# Patient Record
Sex: Female | Born: 1950
Health system: Southern US, Community
[De-identification: ages and names within clinical notes are randomized; demographics above are authoritative.]

## PROBLEM LIST (undated history)

## (undated) DIAGNOSIS — E785 Hyperlipidemia, unspecified: Secondary | ICD-10-CM

## (undated) DIAGNOSIS — I429 Cardiomyopathy, unspecified: Secondary | ICD-10-CM

## (undated) DIAGNOSIS — K589 Irritable bowel syndrome without diarrhea: Secondary | ICD-10-CM

## (undated) DIAGNOSIS — Z86010 Personal history of colonic polyps: Secondary | ICD-10-CM

## (undated) DIAGNOSIS — I219 Acute myocardial infarction, unspecified: Secondary | ICD-10-CM

## (undated) DIAGNOSIS — M1990 Unspecified osteoarthritis, unspecified site: Secondary | ICD-10-CM

## (undated) DIAGNOSIS — D126 Benign neoplasm of colon, unspecified: Secondary | ICD-10-CM

## (undated) DIAGNOSIS — D649 Anemia, unspecified: Secondary | ICD-10-CM

## (undated) DIAGNOSIS — K648 Other hemorrhoids: Secondary | ICD-10-CM

## (undated) DIAGNOSIS — M549 Dorsalgia, unspecified: Secondary | ICD-10-CM

## (undated) DIAGNOSIS — E039 Hypothyroidism, unspecified: Secondary | ICD-10-CM

## (undated) DIAGNOSIS — M797 Fibromyalgia: Secondary | ICD-10-CM

## (undated) DIAGNOSIS — K219 Gastro-esophageal reflux disease without esophagitis: Secondary | ICD-10-CM

## (undated) DIAGNOSIS — G8929 Other chronic pain: Secondary | ICD-10-CM

## (undated) DIAGNOSIS — J189 Pneumonia, unspecified organism: Secondary | ICD-10-CM

## (undated) DIAGNOSIS — I10 Essential (primary) hypertension: Secondary | ICD-10-CM

## (undated) DIAGNOSIS — K649 Unspecified hemorrhoids: Secondary | ICD-10-CM

## (undated) DIAGNOSIS — M79605 Pain in left leg: Secondary | ICD-10-CM

## (undated) HISTORY — PX: CERVICAL DISC SURGERY: SHX588

## (undated) HISTORY — DX: Benign neoplasm of colon, unspecified: D12.6

## (undated) HISTORY — DX: Irritable bowel syndrome, unspecified: K58.9

## (undated) HISTORY — DX: Dorsalgia, unspecified: M54.9

## (undated) HISTORY — DX: Anemia, unspecified: D64.9

## (undated) HISTORY — DX: Gastro-esophageal reflux disease without esophagitis: K21.9

## (undated) HISTORY — DX: Hypothyroidism, unspecified: E03.9

## (undated) HISTORY — DX: Unspecified osteoarthritis, unspecified site: M19.90

## (undated) HISTORY — DX: Other chronic pain: G89.29

## (undated) HISTORY — PX: TONSILLECTOMY: SUR1361

## (undated) HISTORY — DX: Fibromyalgia: M79.7

## (undated) HISTORY — PX: LUMBAR DISC SURGERY: SHX700

## (undated) HISTORY — DX: Other hemorrhoids: K64.8

## (undated) HISTORY — DX: Personal history of colonic polyps: Z86.010

## (undated) HISTORY — DX: Unspecified hemorrhoids: K64.9

## (undated) HISTORY — DX: Cardiomyopathy, unspecified: I42.9

## (undated) HISTORY — PX: ABDOMINAL HYSTERECTOMY: SUR658

## (undated) HISTORY — DX: Hyperlipidemia, unspecified: E78.5

## (undated) HISTORY — DX: Essential (primary) hypertension: I10

## (undated) HISTORY — PX: CHOLECYSTECTOMY: SHX55

## (undated) SURGERY — COLONOSCOPY
Anesthesia: Monitor Anesthesia Care

---

## 1998-04-07 ENCOUNTER — Ambulatory Visit (HOSPITAL_COMMUNITY): Admission: RE | Admit: 1998-04-07 | Discharge: 1998-04-07 | Payer: Self-pay | Admitting: Cardiovascular Disease

## 1998-06-05 ENCOUNTER — Ambulatory Visit (HOSPITAL_COMMUNITY): Admission: RE | Admit: 1998-06-05 | Discharge: 1998-06-05 | Payer: Self-pay | Admitting: Gastroenterology

## 1998-07-23 ENCOUNTER — Ambulatory Visit (HOSPITAL_COMMUNITY): Admission: RE | Admit: 1998-07-23 | Discharge: 1998-07-23 | Payer: Self-pay | Admitting: Obstetrics and Gynecology

## 1998-10-09 ENCOUNTER — Inpatient Hospital Stay (HOSPITAL_COMMUNITY): Admission: EM | Admit: 1998-10-09 | Discharge: 1998-10-11 | Payer: Self-pay | Admitting: Obstetrics and Gynecology

## 1998-10-16 ENCOUNTER — Inpatient Hospital Stay (HOSPITAL_COMMUNITY): Admission: RE | Admit: 1998-10-16 | Discharge: 1998-10-16 | Payer: Self-pay | Admitting: Obstetrics and Gynecology

## 1999-02-22 ENCOUNTER — Emergency Department (HOSPITAL_COMMUNITY): Admission: EM | Admit: 1999-02-22 | Discharge: 1999-02-22 | Payer: Self-pay | Admitting: Emergency Medicine

## 1999-07-07 ENCOUNTER — Ambulatory Visit (HOSPITAL_COMMUNITY): Admission: RE | Admit: 1999-07-07 | Discharge: 1999-07-07 | Payer: Self-pay | Admitting: Cardiovascular Disease

## 1999-07-07 ENCOUNTER — Encounter: Payer: Self-pay | Admitting: Cardiovascular Disease

## 1999-12-09 ENCOUNTER — Encounter: Payer: Self-pay | Admitting: Emergency Medicine

## 1999-12-09 ENCOUNTER — Emergency Department (HOSPITAL_COMMUNITY): Admission: EM | Admit: 1999-12-09 | Discharge: 1999-12-09 | Payer: Self-pay | Admitting: Emergency Medicine

## 1999-12-14 ENCOUNTER — Encounter: Admission: RE | Admit: 1999-12-14 | Discharge: 2000-03-13 | Payer: Self-pay | Admitting: Anesthesiology

## 2000-01-26 ENCOUNTER — Encounter: Admission: RE | Admit: 2000-01-26 | Discharge: 2000-03-31 | Payer: Self-pay | Admitting: *Deleted

## 2000-03-03 ENCOUNTER — Other Ambulatory Visit: Admission: RE | Admit: 2000-03-03 | Discharge: 2000-03-03 | Payer: Self-pay | Admitting: Obstetrics and Gynecology

## 2000-04-04 ENCOUNTER — Encounter: Admission: RE | Admit: 2000-04-04 | Discharge: 2000-07-03 | Payer: Self-pay | Admitting: Anesthesiology

## 2000-06-14 ENCOUNTER — Emergency Department (HOSPITAL_COMMUNITY): Admission: EM | Admit: 2000-06-14 | Discharge: 2000-06-14 | Payer: Self-pay | Admitting: Emergency Medicine

## 2000-07-11 ENCOUNTER — Ambulatory Visit (HOSPITAL_COMMUNITY): Admission: RE | Admit: 2000-07-11 | Discharge: 2000-07-11 | Payer: Self-pay | Admitting: Obstetrics and Gynecology

## 2000-07-11 ENCOUNTER — Encounter: Payer: Self-pay | Admitting: Obstetrics and Gynecology

## 2000-10-30 ENCOUNTER — Encounter: Payer: Self-pay | Admitting: Internal Medicine

## 2000-10-30 ENCOUNTER — Emergency Department (HOSPITAL_COMMUNITY): Admission: EM | Admit: 2000-10-30 | Discharge: 2000-10-30 | Payer: Self-pay | Admitting: Internal Medicine

## 2001-02-10 ENCOUNTER — Encounter: Admission: RE | Admit: 2001-02-10 | Discharge: 2001-02-10 | Payer: Self-pay | Admitting: *Deleted

## 2001-02-10 ENCOUNTER — Encounter: Payer: Self-pay | Admitting: *Deleted

## 2001-04-14 ENCOUNTER — Other Ambulatory Visit: Admission: RE | Admit: 2001-04-14 | Discharge: 2001-04-14 | Payer: Self-pay | Admitting: Obstetrics and Gynecology

## 2001-07-17 ENCOUNTER — Ambulatory Visit (HOSPITAL_COMMUNITY): Admission: RE | Admit: 2001-07-17 | Discharge: 2001-07-17 | Payer: Self-pay | Admitting: Obstetrics and Gynecology

## 2001-07-17 ENCOUNTER — Encounter: Payer: Self-pay | Admitting: Obstetrics and Gynecology

## 2001-08-10 ENCOUNTER — Emergency Department (HOSPITAL_COMMUNITY): Admission: EM | Admit: 2001-08-10 | Discharge: 2001-08-10 | Payer: Self-pay | Admitting: Emergency Medicine

## 2001-08-23 ENCOUNTER — Encounter: Payer: Self-pay | Admitting: Emergency Medicine

## 2001-08-23 ENCOUNTER — Emergency Department (HOSPITAL_COMMUNITY): Admission: EM | Admit: 2001-08-23 | Discharge: 2001-08-23 | Payer: Self-pay | Admitting: Emergency Medicine

## 2002-07-20 ENCOUNTER — Ambulatory Visit (HOSPITAL_COMMUNITY): Admission: RE | Admit: 2002-07-20 | Discharge: 2002-07-20 | Payer: Self-pay | Admitting: *Deleted

## 2002-07-20 ENCOUNTER — Encounter: Payer: Self-pay | Admitting: Obstetrics and Gynecology

## 2002-08-15 ENCOUNTER — Encounter: Payer: Self-pay | Admitting: Emergency Medicine

## 2002-08-15 ENCOUNTER — Emergency Department (HOSPITAL_COMMUNITY): Admission: EM | Admit: 2002-08-15 | Discharge: 2002-08-15 | Payer: Self-pay | Admitting: Emergency Medicine

## 2002-08-20 ENCOUNTER — Emergency Department (HOSPITAL_COMMUNITY): Admission: EM | Admit: 2002-08-20 | Discharge: 2002-08-20 | Payer: Self-pay | Admitting: Emergency Medicine

## 2002-08-27 ENCOUNTER — Other Ambulatory Visit: Admission: RE | Admit: 2002-08-27 | Discharge: 2002-08-27 | Payer: Self-pay | Admitting: Obstetrics and Gynecology

## 2002-09-25 ENCOUNTER — Emergency Department (HOSPITAL_COMMUNITY): Admission: EM | Admit: 2002-09-25 | Discharge: 2002-09-25 | Payer: Self-pay | Admitting: Emergency Medicine

## 2002-09-25 ENCOUNTER — Encounter: Payer: Self-pay | Admitting: Emergency Medicine

## 2002-10-01 ENCOUNTER — Encounter: Admission: RE | Admit: 2002-10-01 | Discharge: 2002-10-01 | Payer: Self-pay | Admitting: Family Medicine

## 2002-10-01 ENCOUNTER — Encounter: Payer: Self-pay | Admitting: Family Medicine

## 2002-10-02 ENCOUNTER — Encounter: Admission: RE | Admit: 2002-10-02 | Discharge: 2002-10-02 | Payer: Self-pay | Admitting: Family Medicine

## 2002-10-02 ENCOUNTER — Encounter: Payer: Self-pay | Admitting: Family Medicine

## 2003-04-02 ENCOUNTER — Other Ambulatory Visit: Admission: RE | Admit: 2003-04-02 | Discharge: 2003-04-02 | Payer: Self-pay

## 2003-04-04 ENCOUNTER — Encounter: Admission: RE | Admit: 2003-04-04 | Discharge: 2003-04-04 | Payer: Self-pay

## 2003-04-18 ENCOUNTER — Encounter: Payer: Self-pay | Admitting: Family Medicine

## 2003-04-18 ENCOUNTER — Encounter: Admission: RE | Admit: 2003-04-18 | Discharge: 2003-04-18 | Payer: Self-pay | Admitting: Family Medicine

## 2003-04-24 ENCOUNTER — Encounter: Admission: RE | Admit: 2003-04-24 | Discharge: 2003-04-24 | Payer: Self-pay | Admitting: Family Medicine

## 2003-04-24 ENCOUNTER — Encounter: Payer: Self-pay | Admitting: Family Medicine

## 2003-07-22 ENCOUNTER — Ambulatory Visit (HOSPITAL_COMMUNITY): Admission: RE | Admit: 2003-07-22 | Discharge: 2003-07-22 | Payer: Self-pay | Admitting: Family Medicine

## 2003-07-22 ENCOUNTER — Encounter: Payer: Self-pay | Admitting: Family Medicine

## 2003-09-04 ENCOUNTER — Encounter: Payer: Self-pay | Admitting: Physician Assistant

## 2003-09-04 ENCOUNTER — Ambulatory Visit (HOSPITAL_COMMUNITY): Admission: RE | Admit: 2003-09-04 | Discharge: 2003-09-04 | Payer: Self-pay | Admitting: Gastroenterology

## 2004-07-22 ENCOUNTER — Ambulatory Visit (HOSPITAL_COMMUNITY): Admission: RE | Admit: 2004-07-22 | Discharge: 2004-07-22 | Payer: Self-pay | Admitting: Family Medicine

## 2004-08-30 ENCOUNTER — Emergency Department (HOSPITAL_COMMUNITY): Admission: EM | Admit: 2004-08-30 | Discharge: 2004-08-30 | Payer: Self-pay | Admitting: Emergency Medicine

## 2005-07-23 ENCOUNTER — Ambulatory Visit (HOSPITAL_COMMUNITY): Admission: RE | Admit: 2005-07-23 | Discharge: 2005-07-23 | Payer: Self-pay | Admitting: Family Medicine

## 2006-02-26 ENCOUNTER — Encounter: Admission: RE | Admit: 2006-02-26 | Discharge: 2006-02-26 | Payer: Self-pay | Admitting: Neurosurgery

## 2006-03-21 ENCOUNTER — Ambulatory Visit (HOSPITAL_COMMUNITY): Admission: RE | Admit: 2006-03-21 | Discharge: 2006-03-22 | Payer: Self-pay | Admitting: Neurosurgery

## 2006-07-25 ENCOUNTER — Ambulatory Visit (HOSPITAL_COMMUNITY): Admission: RE | Admit: 2006-07-25 | Discharge: 2006-07-25 | Payer: Self-pay | Admitting: Family Medicine

## 2006-09-13 ENCOUNTER — Encounter: Admission: RE | Admit: 2006-09-13 | Discharge: 2006-09-13 | Payer: Self-pay | Admitting: Neurosurgery

## 2006-10-03 ENCOUNTER — Inpatient Hospital Stay (HOSPITAL_COMMUNITY): Admission: RE | Admit: 2006-10-03 | Discharge: 2006-10-06 | Payer: Self-pay | Admitting: Neurosurgery

## 2006-11-10 ENCOUNTER — Encounter: Admission: RE | Admit: 2006-11-10 | Discharge: 2006-11-10 | Payer: Self-pay | Admitting: Neurosurgery

## 2007-03-23 ENCOUNTER — Emergency Department (HOSPITAL_COMMUNITY): Admission: EM | Admit: 2007-03-23 | Discharge: 2007-03-23 | Payer: Self-pay | Admitting: Emergency Medicine

## 2007-03-26 ENCOUNTER — Encounter: Admission: RE | Admit: 2007-03-26 | Discharge: 2007-03-26 | Payer: Self-pay | Admitting: Neurosurgery

## 2007-03-30 ENCOUNTER — Other Ambulatory Visit: Admission: RE | Admit: 2007-03-30 | Discharge: 2007-03-30 | Payer: Self-pay | Admitting: Unknown Physician Specialty

## 2007-07-27 ENCOUNTER — Ambulatory Visit (HOSPITAL_COMMUNITY): Admission: RE | Admit: 2007-07-27 | Discharge: 2007-07-27 | Payer: Self-pay | Admitting: Family Medicine

## 2007-10-18 ENCOUNTER — Encounter: Admission: RE | Admit: 2007-10-18 | Discharge: 2007-10-18 | Payer: Self-pay | Admitting: Neurosurgery

## 2008-01-25 ENCOUNTER — Encounter: Admission: RE | Admit: 2008-01-25 | Discharge: 2008-01-25 | Payer: Self-pay | Admitting: Otolaryngology

## 2008-04-05 ENCOUNTER — Encounter: Admission: RE | Admit: 2008-04-05 | Discharge: 2008-04-05 | Payer: Self-pay | Admitting: Neurosurgery

## 2008-04-11 ENCOUNTER — Encounter: Admission: RE | Admit: 2008-04-11 | Discharge: 2008-04-11 | Payer: Self-pay | Admitting: Neurosurgery

## 2008-06-04 ENCOUNTER — Encounter: Admission: RE | Admit: 2008-06-04 | Discharge: 2008-06-04 | Payer: Self-pay | Admitting: Neurosurgery

## 2008-07-02 LAB — HM PAP SMEAR: HM Pap smear: NORMAL

## 2008-07-30 ENCOUNTER — Ambulatory Visit (HOSPITAL_COMMUNITY): Admission: RE | Admit: 2008-07-30 | Discharge: 2008-07-30 | Payer: Self-pay | Admitting: Family Medicine

## 2008-07-31 ENCOUNTER — Encounter: Payer: Self-pay | Admitting: Cardiovascular Disease

## 2008-08-02 ENCOUNTER — Encounter: Payer: Self-pay | Admitting: Cardiovascular Disease

## 2008-08-15 ENCOUNTER — Encounter: Admission: RE | Admit: 2008-08-15 | Discharge: 2008-08-15 | Payer: Self-pay | Admitting: Neurosurgery

## 2008-09-19 ENCOUNTER — Encounter: Admission: RE | Admit: 2008-09-19 | Discharge: 2008-09-19 | Payer: Self-pay | Admitting: Neurosurgery

## 2009-04-18 ENCOUNTER — Encounter: Admission: RE | Admit: 2009-04-18 | Discharge: 2009-04-18 | Payer: Self-pay | Admitting: Neurosurgery

## 2009-04-19 ENCOUNTER — Emergency Department (HOSPITAL_COMMUNITY): Admission: EM | Admit: 2009-04-19 | Discharge: 2009-04-19 | Payer: Self-pay | Admitting: Emergency Medicine

## 2009-04-22 ENCOUNTER — Encounter: Payer: Self-pay | Admitting: Physician Assistant

## 2009-04-22 ENCOUNTER — Encounter: Admission: RE | Admit: 2009-04-22 | Discharge: 2009-04-22 | Payer: Self-pay | Admitting: Neurosurgery

## 2009-04-25 ENCOUNTER — Ambulatory Visit: Payer: Self-pay | Admitting: Gastroenterology

## 2009-04-25 DIAGNOSIS — E039 Hypothyroidism, unspecified: Secondary | ICD-10-CM | POA: Insufficient documentation

## 2009-04-25 DIAGNOSIS — I10 Essential (primary) hypertension: Secondary | ICD-10-CM | POA: Insufficient documentation

## 2009-04-25 DIAGNOSIS — M549 Dorsalgia, unspecified: Secondary | ICD-10-CM | POA: Insufficient documentation

## 2009-04-25 DIAGNOSIS — IMO0001 Reserved for inherently not codable concepts without codable children: Secondary | ICD-10-CM | POA: Insufficient documentation

## 2009-04-25 LAB — CONVERTED CEMR LAB
Basophils Absolute: 0 10*3/uL (ref 0.0–0.1)
Bilirubin, Direct: 0.1 mg/dL (ref 0.0–0.3)
CO2: 31 meq/L (ref 19–32)
Calcium: 9.5 mg/dL (ref 8.4–10.5)
Creatinine, Ser: 0.7 mg/dL (ref 0.4–1.2)
Eosinophils Absolute: 0.2 10*3/uL (ref 0.0–0.7)
Ferritin: 49.8 ng/mL (ref 10.0–291.0)
Folate: 8.8 ng/mL
GFR calc non Af Amer: 110.56 mL/min (ref >60)
HCT: 38.8 % (ref 36.0–46.0)
Hemoglobin: 13.1 g/dL (ref 12.0–15.0)
Lymphs Abs: 2.5 10*3/uL (ref 0.7–4.0)
MCHC: 33.8 g/dL (ref 30.0–36.0)
MCV: 80 fL (ref 78.0–100.0)
Monocytes Absolute: 0.3 10*3/uL (ref 0.1–1.0)
Neutro Abs: 4 10*3/uL (ref 1.4–7.7)
Platelets: 309 10*3/uL (ref 150.0–400.0)
RDW: 13.8 % (ref 11.5–14.6)
Total Protein: 8.1 g/dL (ref 6.0–8.3)
Transferrin: 260.4 mg/dL (ref 212.0–360.0)
Vitamin B-12: 502 pg/mL (ref 211–911)

## 2009-05-02 ENCOUNTER — Telehealth: Payer: Self-pay | Admitting: Gastroenterology

## 2009-07-22 ENCOUNTER — Ambulatory Visit: Payer: Self-pay | Admitting: *Deleted

## 2009-07-23 ENCOUNTER — Observation Stay (HOSPITAL_COMMUNITY): Admission: EM | Admit: 2009-07-23 | Discharge: 2009-07-24 | Payer: Self-pay | Admitting: Emergency Medicine

## 2009-07-23 ENCOUNTER — Encounter: Payer: Self-pay | Admitting: Physician Assistant

## 2009-07-24 ENCOUNTER — Encounter: Payer: Self-pay | Admitting: Physician Assistant

## 2009-07-29 ENCOUNTER — Telehealth: Payer: Self-pay | Admitting: Gastroenterology

## 2009-07-30 ENCOUNTER — Ambulatory Visit (HOSPITAL_COMMUNITY): Admission: RE | Admit: 2009-07-30 | Discharge: 2009-07-30 | Payer: Self-pay | Admitting: Gastroenterology

## 2009-07-30 ENCOUNTER — Ambulatory Visit: Payer: Self-pay | Admitting: Gastroenterology

## 2009-07-30 DIAGNOSIS — E785 Hyperlipidemia, unspecified: Secondary | ICD-10-CM | POA: Insufficient documentation

## 2009-07-31 ENCOUNTER — Encounter: Payer: Self-pay | Admitting: Physician Assistant

## 2009-07-31 ENCOUNTER — Ambulatory Visit (HOSPITAL_COMMUNITY): Admission: RE | Admit: 2009-07-31 | Discharge: 2009-07-31 | Payer: Self-pay | Admitting: Family Medicine

## 2009-08-01 LAB — CONVERTED CEMR LAB
ALT: 17 units/L (ref 0–35)
Albumin: 3.8 g/dL (ref 3.5–5.2)
Alkaline Phosphatase: 109 units/L (ref 39–117)
Basophils Absolute: 0 10*3/uL (ref 0.0–0.1)
Bilirubin, Direct: 0.1 mg/dL (ref 0.0–0.3)
Eosinophils Absolute: 0.3 10*3/uL (ref 0.0–0.7)
Lymphocytes Relative: 31.4 % (ref 12.0–46.0)
MCHC: 34.1 g/dL (ref 30.0–36.0)
Neutrophils Relative %: 59.7 % (ref 43.0–77.0)
RBC: 4.94 M/uL (ref 3.87–5.11)
RDW: 13.5 % (ref 11.5–14.6)
Total Protein: 8 g/dL (ref 6.0–8.3)

## 2009-08-12 ENCOUNTER — Encounter: Payer: Self-pay | Admitting: Gastroenterology

## 2009-08-22 ENCOUNTER — Ambulatory Visit (HOSPITAL_COMMUNITY): Admission: RE | Admit: 2009-08-22 | Discharge: 2009-08-22 | Payer: Self-pay | Admitting: General Surgery

## 2009-08-22 ENCOUNTER — Encounter (INDEPENDENT_AMBULATORY_CARE_PROVIDER_SITE_OTHER): Payer: Self-pay | Admitting: General Surgery

## 2009-09-18 ENCOUNTER — Encounter: Payer: Self-pay | Admitting: Gastroenterology

## 2009-09-22 ENCOUNTER — Telehealth: Payer: Self-pay | Admitting: Physician Assistant

## 2009-10-20 ENCOUNTER — Ambulatory Visit: Payer: Self-pay | Admitting: Internal Medicine

## 2009-10-20 DIAGNOSIS — K219 Gastro-esophageal reflux disease without esophagitis: Secondary | ICD-10-CM | POA: Insufficient documentation

## 2009-10-20 LAB — CONVERTED CEMR LAB
ALT: 12 units/L (ref 0–35)
Basophils Relative: 0.1 % (ref 0.0–3.0)
Bilirubin, Direct: 0.1 mg/dL (ref 0.0–0.3)
Chloride: 99 meq/L (ref 96–112)
Cholesterol, target level: 200 mg/dL
Eosinophils Relative: 2.7 % (ref 0.0–5.0)
HCT: 37.5 % (ref 36.0–46.0)
LDL Cholesterol: 64 mg/dL (ref 0–99)
LDL Goal: 130 mg/dL
Lymphs Abs: 2.8 10*3/uL (ref 0.7–4.0)
MCV: 82.3 fL (ref 78.0–100.0)
Monocytes Absolute: 0.4 10*3/uL (ref 0.1–1.0)
Neutrophils Relative %: 56.2 % (ref 43.0–77.0)
Potassium: 3.9 meq/L (ref 3.5–5.1)
RBC: 4.56 M/uL (ref 3.87–5.11)
Sodium: 139 meq/L (ref 135–145)
Total CHOL/HDL Ratio: 4
Total Protein: 7.8 g/dL (ref 6.0–8.3)
WBC: 7.6 10*3/uL (ref 4.5–10.5)

## 2009-12-11 ENCOUNTER — Ambulatory Visit: Payer: Self-pay | Admitting: Internal Medicine

## 2009-12-11 LAB — CONVERTED CEMR LAB
Albumin: 3.8 g/dL (ref 3.5–5.2)
Alkaline Phosphatase: 116 units/L (ref 39–117)
BUN: 8 mg/dL (ref 6–23)
Calcium: 9 mg/dL (ref 8.4–10.5)
Creatinine, Ser: 0.7 mg/dL (ref 0.4–1.2)
GFR calc non Af Amer: 110.32 mL/min (ref >60)
Glucose, Bld: 69 mg/dL — ABNORMAL LOW (ref 70–99)
Sodium: 137 meq/L (ref 135–145)
TSH: 8.29 microintl units/mL — ABNORMAL HIGH (ref 0.35–5.50)
Total CK: 92 units/L (ref 7–177)

## 2009-12-12 ENCOUNTER — Encounter: Payer: Self-pay | Admitting: Internal Medicine

## 2009-12-25 ENCOUNTER — Ambulatory Visit: Payer: Self-pay | Admitting: Internal Medicine

## 2009-12-25 DIAGNOSIS — E876 Hypokalemia: Secondary | ICD-10-CM | POA: Insufficient documentation

## 2010-01-09 ENCOUNTER — Telehealth: Payer: Self-pay | Admitting: Internal Medicine

## 2010-02-17 ENCOUNTER — Ambulatory Visit: Payer: Self-pay | Admitting: Internal Medicine

## 2010-02-17 LAB — CONVERTED CEMR LAB
Eosinophils Relative: 2.1 % (ref 0.0–5.0)
GFR calc non Af Amer: 82.49 mL/min (ref >60)
HCT: 35.9 % — ABNORMAL LOW (ref 36.0–46.0)
Hemoglobin: 11.7 g/dL — ABNORMAL LOW (ref 12.0–15.0)
Lymphocytes Relative: 34.1 % (ref 12.0–46.0)
Lymphs Abs: 2.4 10*3/uL (ref 0.7–4.0)
Monocytes Relative: 5.2 % (ref 3.0–12.0)
Neutro Abs: 4 10*3/uL (ref 1.4–7.7)
Potassium: 3.6 meq/L (ref 3.5–5.1)
RBC: 4.4 M/uL (ref 3.87–5.11)
Sodium: 143 meq/L (ref 135–145)
WBC: 7 10*3/uL (ref 4.5–10.5)

## 2010-02-18 ENCOUNTER — Encounter: Payer: Self-pay | Admitting: Internal Medicine

## 2010-02-20 ENCOUNTER — Encounter: Payer: Self-pay | Admitting: Internal Medicine

## 2010-04-08 ENCOUNTER — Ambulatory Visit: Payer: Self-pay | Admitting: Internal Medicine

## 2010-04-14 ENCOUNTER — Encounter: Admission: RE | Admit: 2010-04-14 | Discharge: 2010-04-14 | Payer: Self-pay | Admitting: Neurosurgery

## 2010-04-21 ENCOUNTER — Ambulatory Visit: Payer: Self-pay | Admitting: Internal Medicine

## 2010-04-21 DIAGNOSIS — E1149 Type 2 diabetes mellitus with other diabetic neurological complication: Secondary | ICD-10-CM | POA: Insufficient documentation

## 2010-04-21 DIAGNOSIS — D649 Anemia, unspecified: Secondary | ICD-10-CM | POA: Insufficient documentation

## 2010-04-21 LAB — CONVERTED CEMR LAB
Basophils Absolute: 0.1 10*3/uL (ref 0.0–0.1)
CO2: 32 meq/L (ref 19–32)
Calcium: 9.3 mg/dL (ref 8.4–10.5)
Chloride: 104 meq/L (ref 96–112)
Folate: 6.8 ng/mL
GFR calc non Af Amer: 95.83 mL/min (ref >60)
Glucose, Bld: 105 mg/dL — ABNORMAL HIGH (ref 70–99)
Hemoglobin: 12.6 g/dL (ref 12.0–15.0)
Iron: 50 ug/dL (ref 42–145)
Lymphocytes Relative: 32.2 % (ref 12.0–46.0)
Monocytes Relative: 5.8 % (ref 3.0–12.0)
Neutro Abs: 4.8 10*3/uL (ref 1.4–7.7)
Potassium: 3.6 meq/L (ref 3.5–5.1)
RDW: 14.4 % (ref 11.5–14.6)
TSH: 1.42 microintl units/mL (ref 0.35–5.50)
WBC: 8 10*3/uL (ref 4.5–10.5)

## 2010-04-24 ENCOUNTER — Encounter (INDEPENDENT_AMBULATORY_CARE_PROVIDER_SITE_OTHER): Payer: Self-pay | Admitting: *Deleted

## 2010-04-24 ENCOUNTER — Ambulatory Visit: Payer: Self-pay | Admitting: Gastroenterology

## 2010-05-05 ENCOUNTER — Telehealth: Payer: Self-pay | Admitting: Gastroenterology

## 2010-05-06 ENCOUNTER — Ambulatory Visit: Payer: Self-pay | Admitting: Gastroenterology

## 2010-05-06 DIAGNOSIS — Z860100 Personal history of colon polyps, unspecified: Secondary | ICD-10-CM

## 2010-05-06 HISTORY — DX: Personal history of colonic polyps: Z86.010

## 2010-05-06 HISTORY — DX: Personal history of colon polyps, unspecified: Z86.0100

## 2010-05-06 LAB — HM COLONOSCOPY

## 2010-05-08 ENCOUNTER — Encounter: Payer: Self-pay | Admitting: Gastroenterology

## 2010-06-23 ENCOUNTER — Ambulatory Visit: Payer: Self-pay | Admitting: Internal Medicine

## 2010-06-23 DIAGNOSIS — T6391XA Toxic effect of contact with unspecified venomous animal, accidental (unintentional), initial encounter: Secondary | ICD-10-CM | POA: Insufficient documentation

## 2010-07-29 ENCOUNTER — Telehealth: Payer: Self-pay | Admitting: Internal Medicine

## 2010-08-04 ENCOUNTER — Ambulatory Visit (HOSPITAL_COMMUNITY): Admission: RE | Admit: 2010-08-04 | Discharge: 2010-08-04 | Payer: Self-pay | Admitting: Internal Medicine

## 2010-08-04 LAB — HM MAMMOGRAPHY: HM Mammogram: NEGATIVE

## 2010-08-07 ENCOUNTER — Encounter: Admission: RE | Admit: 2010-08-07 | Discharge: 2010-08-07 | Payer: Self-pay | Admitting: Neurosurgery

## 2010-10-02 ENCOUNTER — Telehealth: Payer: Self-pay | Admitting: Internal Medicine

## 2010-10-16 ENCOUNTER — Encounter: Admission: RE | Admit: 2010-10-16 | Discharge: 2010-10-16 | Payer: Self-pay | Admitting: Neurosurgery

## 2010-10-21 ENCOUNTER — Ambulatory Visit: Payer: Self-pay | Admitting: Internal Medicine

## 2010-10-21 LAB — CONVERTED CEMR LAB
ALT: 14 units/L (ref 0–35)
Basophils Relative: 0.6 % (ref 0.0–3.0)
Bilirubin, Direct: 0.1 mg/dL (ref 0.0–0.3)
Chloride: 98 meq/L (ref 96–112)
Eosinophils Relative: 1.6 % (ref 0.0–5.0)
HCT: 36.5 % (ref 36.0–46.0)
Hgb A1c MFr Bld: 6.1 % (ref 4.6–6.5)
Lymphs Abs: 2.7 10*3/uL (ref 0.7–4.0)
MCV: 79.8 fL (ref 78.0–100.0)
Monocytes Absolute: 0.5 10*3/uL (ref 0.1–1.0)
Potassium: 3.5 meq/L (ref 3.5–5.1)
RBC: 4.57 M/uL (ref 3.87–5.11)
TSH: 3.17 microintl units/mL (ref 0.35–5.50)
Total CHOL/HDL Ratio: 6
Total Protein: 7.1 g/dL (ref 6.0–8.3)
VLDL: 42 mg/dL — ABNORMAL HIGH (ref 0.0–40.0)
WBC: 7.5 10*3/uL (ref 4.5–10.5)

## 2010-12-29 NOTE — Progress Notes (Signed)
----   Converted from flag ---- ---- 07/29/2010 3:51 PM, Denice Paradise wrote: [name];  Pt [name])requesting samples of aciphex & blood pressure medicine. [phone], pls let her know.  [name] ------------------------------  Phone Note Outgoing Call   Summary of Call: Patient notified.Marland KitchenMarland KitchenEllison [name] Archie CMA  July 30, 2010 2:37 PM

## 2010-12-29 NOTE — Progress Notes (Signed)
Summary: Samples  Phone Note Call from Patient Call back at [facility] Phone [phone]   Summary of Call: Patient is requesting samples of aciphex & BP med Initial call taken by: Charlsie Quest, CMA,  October 02, 2010 12:29 PM  Follow-up for Phone Call        Samples of Diovan ready, Aciphex script sent to pharmacy.Ellison Hughs Archie CMA  October 02, 2010 1:51 PM     Prescriptions: ACIPHEX 20 MG TBEC (RABEPRAZOLE SODIUM) once daily for heartburn  #30 x 11   Entered by:   Estell Harpin CMA   Authorized by:   [name] MD   Signed by:   Estell Harpin CMA on 10/02/2010   Method used:   [facility]        [facility] [address] [phone]* (retail)       [address]       [place], [place]  [address]       Ph: [phone] or [phone]       Fax: [phone]   RxID:   [id number]

## 2010-12-29 NOTE — Assessment & Plan Note (Signed)
Summary: anemic--ch.   History of Present Illness Visit Type: consult  Primary GI MD: Verl Blalock MD [place] Primary Provider: [name], MD  Requesting Provider: [name] MD Chief Complaint: anemia  History of Present Illness:   Mild iron deficiency with a saturation of 15% and normal hemoglobin and a 60 year old [place] American female with no other GI symptoms. Her last colonoscopy was 8-10 years ago. She is on daily AcipHex for GERD and denies any GI symptomatology. She is status post cholecystectomy within the last few years. Her appetite is good her weight is stable. She denies melena, hematochezia, or any systemic complaints. Her appetite is good her weight is stable. She denies abuse of alcohol, cigarettes, or NSAIDs.   GI Review of Systems      Denies abdominal pain, acid reflux, belching, bloating, chest pain, dysphagia with liquids, dysphagia with solids, heartburn, loss of appetite, nausea, vomiting, vomiting blood, weight loss, and  weight gain.        Denies anal fissure, black tarry stools, change in bowel habit, constipation, diarrhea, diverticulosis, fecal incontinence, heme positive stool, hemorrhoids, irritable bowel syndrome, jaundice, light color stool, liver problems, rectal bleeding, and  rectal pain.    Current Medications (verified): 1)  Metoprolol Succinate 25 Mg Xr24h-Tab (Metoprolol Succinate) .... Take 1 Tab Daily 2)  Aciphex 20 Mg Tbec (Rabeprazole Sodium) .... Once Daily For Heartburn 3)  Levoxyl 100 Mcg Tabs (Levothyroxine Sodium) .... One By Mouth Once Daily 4)  Diovan Hct 160-12.5 Mg Tabs (Valsartan-Hydrochlorothiazide) .... One By Mouth Once Daily For High Blood Pressure  Allergies (verified): 1)  ! Vicodin 2)  ! Asa 3)  ! * Contrast Dye 4)  ! Lipitor  Past History:  Past medical, surgical, family and social histories (including risk factors) reviewed for relevance to current acute and chronic problems.  Past Medical  History: Reviewed history from 04/21/2010 and no changes required. HYPOTHYROIDISM (ICD-244.9) HYPERTENSION (ICD-401.9) CHOLELITHIASIS (ICD-574.20) FIBROMYALGIA (ICD-729.1) CHRONIC BACK PAIN HYPERLIPIDEMIA CONSTIPATION   Anemia-NOS  Past Surgical History: Reviewed history from 10/20/2009 and no changes required. Hysterectomy Herniated disk surgery Lumbar surgery Tonsillectomy  Family History: Reviewed history from 04/25/2009 and no changes required. Family History of Diabetes: Aunt, Uncles Family History of Breast Cancer:Aunt Family History of Colon Cancer:Maternal Uncle   Social History: Reviewed history from 04/25/2009 and no changes required. 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  Alcohol Use - no Daily Caffeine Use Illicit Drug Use - no  Review of Systems       The patient complains of anemia, fatigue, and muscle pains/cramps.  The patient denies allergy/sinus, anxiety-new, arthritis/joint pain, back pain, blood in urine, breast changes/lumps, change in vision, confusion, cough, coughing up blood, depression-new, fainting, fever, headaches-new, hearing problems, heart murmur, heart rhythm changes, itching, menstrual pain, night sweats, nosebleeds, pregnancy symptoms, shortness of breath, skin rash, sleeping problems, sore throat, swelling of feet/legs, swollen lymph glands, thirst - excessive, urination - excessive, urination changes/pain, urine leakage, vision changes, and voice change.    Vital Signs:  Patient profile:   60 year old female Height:      67 inches Weight:      204 pounds BMI:     32.07 BSA:     2.04 Pulse rate:   74 / minute Pulse rhythm:   regular BP sitting:   132 / 84  (left arm) Cuff size:   large  Vitals Entered By: Hope Pigeon CMA (Apr 24, 2010 10:35 AM)  Physical Exam  General:  Well developed, well nourished, no acute distress.healthy appearing.   Head:  Normocephalic and atraumatic. Eyes:  PERRLA, no icterus.exam deferred to  patient's ophthalmologist.   Neck:  Supple; no masses or thyromegaly. Lungs:  Clear throughout to auscultation. Heart:  Regular rate and rhythm; no murmurs, rubs,  or bruits. Abdomen:  Soft, nontender and nondistended. No masses, hepatosplenomegaly or hernias noted. Normal bowel sounds. Extremities:  No clubbing, cyanosis, edema or deformities noted. Neurologic:  Alert and  oriented x4;  grossly normal neurologically. Cervical Nodes:  No significant cervical adenopathy. Psych:  Alert and cooperative. Normal mood and affect.   Impression & Recommendations:  Problem # 1:  ANEMIA-NOS (JAS-505.9) Assessment Improved Outpatient colonoscopy scheduled her convenience. Orders: Colonoscopy (Colon)  Problem # 2:  GERD (ICD-530.81) Assessment: Improved Continue reflex regime and daily PPI therapy. I do not think she needs repeat endoscopy at this time  Problem # 3:  HYPERTENSION (ICD-401.9) Assessment: Improved blood pressure today 132/84 and she is to continue her antihypertensives as per primary care.  Patient Instructions: 1)  Please pick up your prescription at your pharmacy.  MOVIPREP 2)  We will see you at your procedure on 05/06/10. 3)  [place] [facility] Patient Information Guide given to patient.  4)  Colonoscopy and Flexible Sigmoidoscopy brochure given.  5)  Copy sent to : [name], MD 6)  The medication list was reviewed and reconciled.  All changed / newly prescribed medications were explained.  A complete medication list was provided to the patient / caregiver. 7)  Copy sent to :  8)  Colonoscopy and Flexible Sigmoidoscopy brochure given.  9)  Conscious Sedation brochure given.  Prescriptions: MOVIPREP 100 GM  SOLR (PEG-KCL-NACL-NASULF-NA ASC-C) As per prep instructions.  #1 x 0   Entered by:   Alberteen Spindle RN   Authorized by:   Sable Feil MD [facility]   Signed by:   Alberteen Spindle RN on 04/24/2010   Method used:   [facility]        [facility]  [address] [phone]* (retail)       [address]       [place], [place]  [address]       Ph: [phone] or [phone]       Fax: [phone]   RxID:   [id number]

## 2010-12-29 NOTE — Progress Notes (Signed)
Summary: Samples  Phone Note Call from Patient Call back at [facility] Phone [phone]   Caller: Patient Call For: Dr [name] Summary of Call: Pt requesting refill of acid refllex and  a phone call. Initial call taken by: Denice Paradise,  January 09, 2010 11:37 AM  Follow-up for Phone Call        Pt came in office, she wanted samples, gave pt 6 boxes  Follow-up by: Charlsie Quest, [place],  January 09, 2010 2:35 PM

## 2010-12-29 NOTE — Miscellaneous (Signed)
Summary: [facility]   Imported By: Bubba Hales 02/27/2010 08:41:05  _____________________________________________________________________  External Attachment:    Type:   Image     Comment:   External Document

## 2010-12-29 NOTE — Assessment & Plan Note (Signed)
Summary: 2 MO ROV /NWS   Vital Signs:  Patient profile:   60 year old female Height:      67 inches Weight:      202 pounds BMI:     31.75 O2 Sat:      94 % on Room air Temp:     98.4 degrees F oral Pulse rate:   72 / minute Pulse rhythm:   regular Resp:     16 per minute BP sitting:   112 / 68  (left arm) Cuff size:   large  Vitals Entered By: Estell Harpin CMA (June 23, 2010 8:10 AM)  Nutrition Counseling: Patient's BMI is greater than 25 and therefore counseled on weight management options.  O2 Flow:  Room air CC: pt c/o Left shoulder pain since bee sting Is Patient Diabetic? No Pain Assessment Patient in pain? yes     Location: L shoulder   Primary Care Provider:  [name], MD   CC:  pt c/o Left shoulder pain since bee sting.  History of Present Illness:  Hypertension Follow-Up      This is a 60 year old woman who presents for Hypertension follow-up.  The patient reports fatigue, but denies lightheadedness, urinary frequency, headaches, edema, and rash.  The patient denies the following associated symptoms: chest pain, chest pressure, exercise intolerance, dyspnea, palpitations, syncope, leg edema, and pedal edema.  Compliance with medications (by patient report) has been near 100%.  The patient reports that dietary compliance has been good.  The patient reports exercising occasionally.  Adjunctive measures currently used by the patient include salt restriction and relaxation.    She was stung by a yellow jacket on the top of her left shoulder 3 days ago=she had pain and itching for one day but that has resolved and she feels like the area has healed.  Preventive Screening-Counseling & Management  Alcohol-Tobacco     Alcohol drinks/day: 0     Smoking Status: never  Hep-HIV-STD-Contraception     Hepatitis Risk: no risk noted     HIV Risk: no risk noted     STD Risk: no risk noted     SBE monthly: yes     SBE Education/Counseling: to perform regular  SBE      Sexual History:  currently monogamous.        Drug Use:  never.        Blood Transfusions:  no.    Clinical Review Panels:  Lipid Management   Cholesterol:  124 (10/20/2009)   LDL (bad choesterol):  64 (10/20/2009)   HDL (good cholesterol):  33.80 (10/20/2009)  Diabetes Management   HgBA1C:  5.8 (04/21/2010)   Creatinine:  0.8 (04/21/2010)   Last Flu Vaccine:  Fluvax 3+ (12/11/2009)  CBC   WBC:  8.0 (04/21/2010)   RBC:  4.63 (04/21/2010)   Hgb:  12.6 (04/21/2010)   Hct:  36.4 (04/21/2010)   Platelets:  286.0 (04/21/2010)   MCV  78.6 (04/21/2010)   MCHC  34.5 (04/21/2010)   RDW  14.4 (04/21/2010)   PMN:  60.0 (04/21/2010)   Lymphs:  32.2 (04/21/2010)   Monos:  5.8 (04/21/2010)   Eosinophils:  1.4 (04/21/2010)   Basophil:  0.6 (04/21/2010)  Complete Metabolic Panel   Glucose:  105 (04/21/2010)   Sodium:  142 (04/21/2010)   Potassium:  3.6 (04/21/2010)   Chloride:  104 (04/21/2010)   CO2:  32 (04/21/2010)   BUN:  13 (04/21/2010)   Creatinine:  0.8 (04/21/2010)  Albumin:  3.8 (12/11/2009)   Total Protein:  7.8 (12/11/2009)   Calcium:  9.3 (04/21/2010)   Total Bili:  0.8 (12/11/2009)   Alk Phos:  116 (12/11/2009)   SGPT (ALT):  13 (12/11/2009)   SGOT (AST):  17 (12/11/2009)   Medications Prior to Update: 1)  Metoprolol Succinate 25 Mg Xr24h-Tab (Metoprolol Succinate) .... Take 1 Tab Daily 2)  Aciphex 20 Mg Tbec (Rabeprazole Sodium) .... Once Daily For Heartburn 3)  Levoxyl 100 Mcg Tabs (Levothyroxine Sodium) .... One By Mouth Once Daily 4)  Diovan Hct 160-12.5 Mg Tabs (Valsartan-Hydrochlorothiazide) .... One By Mouth Once Daily For High Blood Pressure  Current Medications (verified): 1)  Metoprolol Succinate 25 Mg Xr24h-Tab (Metoprolol Succinate) .... Take 1 Tab Daily 2)  Aciphex 20 Mg Tbec (Rabeprazole Sodium) .... Once Daily For Heartburn 3)  Levoxyl 100 Mcg Tabs (Levothyroxine Sodium) .... One By Mouth Once Daily 4)  Diovan Hct 160-12.5 Mg Tabs  (Valsartan-Hydrochlorothiazide) .... One By Mouth Once Daily For High Blood Pressure  Allergies (verified): 1)  ! Vicodin 2)  ! Asa 3)  ! * Contrast Dye 4)  ! 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  Alcohol Use - no Daily Caffeine Use Illicit Drug Use - no  Risk Factors: Alcohol Use: 0 (06/23/2010)  Risk Factors: Smoking Status: never (06/23/2010)  Family History: Reviewed history from 04/24/2010 and no changes required. Family History of Diabetes: Aunt, Uncles Family History of Breast Cancer:Aunt Family History of Colon Cancer:Maternal Uncle   Social History: Reviewed history from 04/25/2009 and no changes required. 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  Alcohol Use - no Daily Caffeine Use Illicit Drug Use - no  Review of Systems  The patient denies anorexia, fever, chest pain, abdominal pain, muscle weakness, suspicious skin lesions, melena, hematochezia, and severe indigestion/heartburn.   Endo:  Denies cold intolerance, excessive hunger, excessive thirst, excessive urination, heat intolerance, polyuria, and weight change. Heme:  Denies abnormal bruising, bleeding, enlarge lymph nodes, fevers, pallor, and skin discoloration.  Physical Exam  General:  alert, well-developed, well-nourished, well-hydrated, appropriate dress, normal appearance, healthy-appearing, and cooperative to  examination.   Head:  normocephalic, atraumatic, no abnormalities observed, and no abnormalities palpated.   Eyes:  vision grossly intact, pupils equal, pupils round, pupils reactive to light, pupils react to accomodation, no retinal abnormalitiies, and no nystagmus.   Mouth:  Oral mucosa and oropharynx without lesions or exudates.  Teeth in good repair. Neck:  supple, full ROM, no masses, no thyromegaly, no thyroid nodules or tenderness, no JVD, normal carotid upstroke, no carotid bruits, no cervical lymphadenopathy, and no neck tenderness.   Lungs:  normal respiratory effort, no intercostal retractions, no accessory muscle use, normal breath sounds, no dullness, no fremitus, no crackles, and no wheezes.   Heart:  normal rate, regular rhythm, no murmur, no gallop, no rub, and no JVD.   Abdomen:  soft, non-tender, normal bowel sounds, no distention, no masses, no guarding, no rigidity, no rebound tenderness, no hepatomegaly, and no splenomegaly.   Msk:  No deformity or scoliosis noted of thoracic or lumbar spine.  Pulses:  R and L carotid,radial,femoral,dorsalis pedis and posterior tibial pulses are full and equal bilaterally Extremities:  No clubbing, cyanosis, edema, or deformity noted with normal full range of motion of all joints.   Skin:  there is a 29m excoriation on the top of her left shoulder with no exudate, induration, swelling, ttp, warmth, FB, or streaking. Cervical Nodes:  no anterior cervical adenopathy and no posterior cervical adenopathy.   Axillary Nodes:  no R axillary adenopathy and no L axillary adenopathy.   Psych:  Cognition and judgment appear intact. Alert and cooperative with normal attention span and concentration. No apparent delusions, illusions, hallucinations   Impression & Recommendations:  Problem # 1:  BEE STING REACTION, LOCAL (ICD-989.5) Assessment New no treatment needed at this time  Problem # 2:  ANEMIA-NOS (IOHY-0739) Assessment: Improved  Hgb: 12.6  (04/21/2010)   Hct: 36.4 (04/21/2010)   Platelets: 286.0 (04/21/2010) RBC: 4.63 (04/21/2010)   RDW: 14.4 (04/21/2010)   WBC: 8.0 (04/21/2010) MCV: 78.6 (04/21/2010)   MCHC: 34.5 (04/21/2010) Ferritin: 49.8 (04/25/2009) Iron: 50 (04/21/2010)   % Sat: 14.8 (04/21/2010) B12: 331 (04/21/2010)   Folate: 6.8 (04/21/2010)   TSH: 1.42 (04/21/2010)  Problem # 3:  GERD (ICD-530.81) Assessment: Improved  Her updated medication list for this problem includes:    Aciphex 20 Mg Tbec (Rabeprazole sodium) ..... Once daily for heartburn  Labs Reviewed: Hgb: 12.6 (04/21/2010)   Hct: 36.4 (04/21/2010)  Problem # 4:  HYPERTENSION (ICD-401.9) Assessment: Improved  Her updated medication list for this problem includes:    Metoprolol Succinate 25 Mg Xr24h-tab (Metoprolol succinate) ..Marland Kitchen.. Take 1 tab daily    Diovan Hct 160-12.5 Mg Tabs (Valsartan-hydrochlorothiazide) ..... One by mouth once daily for high blood pressure  Problem # 5:  HYPOTHYROIDISM (ICD-244.9) Assessment: Unchanged  Her updated medication list for this problem includes:    Levoxyl 100 Mcg Tabs (Levothyroxine sodium) ..... One by mouth once daily  Labs Reviewed: TSH: 1.42 (04/21/2010)    HgBA1c: 5.8 (04/21/2010) Chol: 124 (10/20/2009)   HDL: 33.80 (10/20/2009)   LDL: 64 (10/20/2009)   TG: 131.0 (10/20/2009)  Complete Medication List: 1)  Metoprolol Succinate 25 Mg Xr24h-tab (Metoprolol succinate) .... Take 1 tab daily 2)  Aciphex 20 Mg Tbec (Rabeprazole sodium) .... Once daily for heartburn 3)  Levoxyl 100 Mcg Tabs (Levothyroxine sodium) .... One by mouth once daily 4)  Diovan Hct 160-12.5 Mg Tabs (Valsartan-hydrochlorothiazide) .... One by mouth once daily for high blood pressure  Other Orders: TD Toxoids IM 7 YR + ((71062 Admin 1st Vaccine (([id number]  Patient Instructions: 1)  Please schedule a follow-up appointment in 4 months. 2)  It is important that you exercise regularly at least 20 minutes 5 times a week. If you develop  chest pain, have severe difficulty breathing, or feel very tired , stop exercising immediately and seek medical attention. 3)  You need to lose weight. Consider a lower calorie diet and regular exercise.  4)  Check your Blood Pressure regularly. If it is above 140/90: you should make an appointment.   Immunizations Administered:  Tetanus Vaccine:    Vaccine Type: Td    Site: right deltoid    Mfr: Sanofi Pasteur    Dose: 0.5 ml    Route: IM    Given by: LEstell HarpinCMA    Exp. Date: 12/31/2011    Lot #: [id number]   VIS given: 10/17/07 version given June 23, 2010.

## 2010-12-29 NOTE — Letter (Signed)
Summary: Lipid Letter  [facility]   [place], [place] [address]   Phone: [phone]  Fax: [phone]    10/21/2010  [name] [address] [place], [place]  [address]  Dear [name]:  We have carefully reviewed your last lipid profile from 10/20/2009 and the results are noted below with a summary of recommendations for lipid management.    Cholesterol:       205     Goal: <200   HDL "good" Cholesterol:   36.20     Goal: >40   LDL "bad" Cholesterol:   123     Goal: <130   Triglycerides:       210.0     Goal: <150 oops        TLC Diet (Therapeutic Lifestyle Change): Saturated Fats & Transfatty acids should be kept < 7% of total calories ***Reduce Saturated Fats Polyunstaurated Fat can be up to 10% of total calories Monounsaturated Fat Fat can be up to 20% of total calories Total Fat should be no greater than 25-35% of total calories Carbohydrates should be 50-60% of total calories Protein should be approximately 15% of total calories Fiber should be at least 20-30 grams a day ***Increased fiber may help lower LDL Total Cholesterol should be < 263m/day Consider adding plant stanol/sterols to diet (example: Benacol spread) ***A higher intake of unsaturated fat may reduce Triglycerides and Increase HDL    Adjunctive Measures (may lower LIPIDS and reduce risk of Heart Attack) include: Aerobic Exercise (20-30 minutes 3-4 times a week) Limit Alcohol Consumption Weight Reduction Aspirin 75-81 mg a day by mouth (if not allergic or contraindicated) Dietary Fiber 20-30 grams a day by mouth     Current Medications: 1)    Metoprolol Succinate 25 Mg Xr24h-tab (Metoprolol succinate) .... Take 1 tab daily 2)    Aciphex 20 Mg Tbec (Rabeprazole sodium) .... Once daily for heartburn 3)    Levoxyl 100 Mcg Tabs (Levothyroxine sodium) .... One by mouth once daily 4)    Diovan Hct 160-12.5 Mg Tabs (Valsartan-hydrochlorothiazide) .... One by mouth once daily for  high blood pressure  If you have any questions, please call. We appreciate being able to work with you.   Sincerely,    [facility] TJanith LimaMD

## 2010-12-29 NOTE — Assessment & Plan Note (Signed)
Summary: 4-6 mth fu--per pt--stc   Vital Signs:  Patient profile:   60 year old female Menstrual status:  hysterectomy Height:      67 inches Weight:      200.25 pounds BMI:     31.48 O2 Sat:      97 % on Room air Temp:     98.4 degrees F oral Pulse rate:   67 / minute Pulse rhythm:   regular Resp:     16 per minute BP sitting:   112 / 62  (left arm) Cuff size:   large  Vitals Entered By: Estell Harpin CMA (October 21, 2010 8:15 AM)  Nutrition Counseling: Patient's BMI is greater than 25 and therefore counseled on weight management options.  O2 Flow:  Room air CC: F/up, Hypertension Management Is Patient Diabetic? No Pain Assessment Patient in pain? no          Menstrual Status hysterectomy Last PAP Result Normal   Primary Care Provider:  [name], MD   CC:  F/up and Hypertension Management.  History of Present Illness:  Follow-Up Visit      This is a 60 year old woman who presents for Follow-up visit.  The patient denies chest pain, palpitations, dizziness, syncope, low blood sugar symptoms, high blood sugar symptoms, edema, SOB, DOE, PND, and orthopnea.  The patient reports taking meds as prescribed, monitoring BP, and dietary compliance.  When questioned about possible medication side effects, the patient notes none.    Dyspepsia History:      She has no alarm features of dyspepsia including no history of melena, hematochezia, dysphagia, persistent vomiting, or involuntary weight loss > 5%.  There is a prior history of GERD.  The patient does not have a prior history of documented ulcer disease.  The dominant symptom is heartburn or acid reflux.  An H-2 blocker medication is currently being taken.  She notes that the symptoms have improved with the H-2 blocker therapy.  Symptoms have not persisted after 4 weeks of H-2 blocker treatment.    Hypertension History:      She denies headache, chest pain, palpitations, dyspnea with exertion, orthopnea, PND,  peripheral edema, visual symptoms, neurologic problems, syncope, and side effects from treatment.  She notes no problems with any antihypertensive medication side effects.        Positive major cardiovascular risk factors include female age 53 years old or older, hyperlipidemia, and hypertension.  Negative major cardiovascular risk factors include no history of diabetes, negative family history for ischemic heart disease, and non-tobacco-user status.        Further assessment for target organ damage reveals no history of ASHD, cardiac end-organ damage (CHF/LVH), stroke/TIA, peripheral vascular disease, renal insufficiency, or hypertensive retinopathy.     Preventive Screening-Counseling & Management  Alcohol-Tobacco     Alcohol drinks/day: 0     Alcohol Counseling: not indicated; patient does not drink     Smoking Status: never     Tobacco Counseling: not indicated; no tobacco use  Hep-HIV-STD-Contraception     Hepatitis Risk: no risk noted     HIV Risk: no risk noted     STD Risk: no risk noted      Sexual History:  currently monogamous.        Drug Use:  never.        Blood Transfusions:  no.    Clinical Review Panels:  Prevention   Last Mammogram:  ASSESSMENT: Negative - BI-RADS 1^MM DIGITAL SCREENING (08/04/2010)  Last Pap Smear:  Normal (07/30/2009)   Last Colonoscopy:  DONE (05/06/2010)  Immunizations   Last Tetanus Booster:  Td (06/23/2010)   Last Flu Vaccine:  Fluvax 3+ (12/11/2009)  Lipid Management   Cholesterol:  124 (10/20/2009)   LDL (bad choesterol):  64 (10/20/2009)   HDL (good cholesterol):  33.80 (10/20/2009)  Diabetes Management   HgBA1C:  5.8 (04/21/2010)   Creatinine:  0.8 (04/21/2010)   Last Flu Vaccine:  Fluvax 3+ (12/11/2009)  CBC   WBC:  8.0 (04/21/2010)   RBC:  4.63 (04/21/2010)   Hgb:  12.6 (04/21/2010)   Hct:  36.4 (04/21/2010)   Platelets:  286.0 (04/21/2010)   MCV  78.6 (04/21/2010)   MCHC  34.5 (04/21/2010)   RDW  14.4 (04/21/2010)    PMN:  60.0 (04/21/2010)   Lymphs:  32.2 (04/21/2010)   Monos:  5.8 (04/21/2010)   Eosinophils:  1.4 (04/21/2010)   Basophil:  0.6 (04/21/2010)  Complete Metabolic Panel   Glucose:  105 (04/21/2010)   Sodium:  142 (04/21/2010)   Potassium:  3.6 (04/21/2010)   Chloride:  104 (04/21/2010)   CO2:  32 (04/21/2010)   BUN:  13 (04/21/2010)   Creatinine:  0.8 (04/21/2010)   Albumin:  3.8 (12/11/2009)   Total Protein:  7.8 (12/11/2009)   Calcium:  9.3 (04/21/2010)   Total Bili:  0.8 (12/11/2009)   Alk Phos:  116 (12/11/2009)   SGPT (ALT):  13 (12/11/2009)   SGOT (AST):  17 (12/11/2009)   Medications Prior to Update: 1)  Metoprolol Succinate 25 Mg Xr24h-Tab (Metoprolol Succinate) .... Take 1 Tab Daily 2)  Aciphex 20 Mg Tbec (Rabeprazole Sodium) .... Once Daily For Heartburn 3)  Levoxyl 100 Mcg Tabs (Levothyroxine Sodium) .... One By Mouth Once Daily 4)  Diovan Hct 160-12.5 Mg Tabs (Valsartan-Hydrochlorothiazide) .... One By Mouth Once Daily For High Blood Pressure  Current Medications (verified): 1)  Metoprolol Succinate 25 Mg Xr24h-Tab (Metoprolol Succinate) .... Take 1 Tab Daily 2)  Aciphex 20 Mg Tbec (Rabeprazole Sodium) .... Once Daily For Heartburn 3)  Levoxyl 100 Mcg Tabs (Levothyroxine Sodium) .... One By Mouth Once Daily 4)  Diovan Hct 160-12.5 Mg Tabs (Valsartan-Hydrochlorothiazide) .... One By Mouth Once Daily For High Blood Pressure  Allergies (verified): 1)  ! Vicodin 2)  ! Asa 3)  ! * Contrast Dye 4)  ! 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  Alcohol Use - no Daily Caffeine Use Illicit Drug Use - no  Risk Factors: Alcohol Use: 0 (10/21/2010)  Risk Factors: Smoking Status: never (10/21/2010)  Family History: Reviewed history from 04/24/2010 and no changes required. Family History of Diabetes: Aunt, Uncles Family History of Breast Cancer:Aunt Family History of Colon Cancer:Maternal Uncle   Social History: Reviewed history from 04/25/2009 and no changes required. 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  Alcohol Use - no Daily Caffeine Use Illicit Drug Use - no  Review of Systems  The patient denies anorexia, fever, weight loss, weight gain, chest pain, syncope, dyspnea on exertion, peripheral edema, prolonged cough, headaches, hemoptysis, abdominal pain, melena, hematochezia, severe indigestion/heartburn, hematuria,  suspicious skin lesions, difficulty walking, depression, and angioedema.   Endo:  Denies cold intolerance, excessive hunger, excessive thirst, excessive urination, heat intolerance, polyuria, and weight change. Heme:  Denies abnormal bruising, bleeding, enlarge lymph nodes, fevers, pallor, and skin discoloration.  Physical Exam  General:  alert, well-developed, well-nourished, well-hydrated, appropriate dress, normal appearance, healthy-appearing, and cooperative to examination.   Head:  normocephalic, atraumatic, no abnormalities observed, and no abnormalities palpated.   Mouth:  Oral mucosa and oropharynx without lesions or exudates.  Teeth in good repair. Neck:  supple, full ROM, no masses, no thyromegaly, no thyroid nodules or tenderness, no JVD, normal carotid upstroke, no carotid bruits, no cervical lymphadenopathy, and no neck tenderness.   Lungs:  normal respiratory effort, no intercostal retractions, no accessory muscle use, normal breath  sounds, no dullness, no fremitus, no crackles, and no wheezes.   Heart:  normal rate, regular rhythm, no murmur, no gallop, no rub, and no JVD.   Abdomen:  soft, non-tender, normal bowel sounds, no distention, no masses, no guarding, no rigidity, no rebound tenderness, no hepatomegaly, and no splenomegaly.   Msk:  No deformity or scoliosis noted of thoracic or lumbar spine.   Pulses:  R and L carotid,radial,femoral,dorsalis pedis and posterior tibial pulses are full and equal bilaterally Extremities:  No clubbing, cyanosis, edema, or deformity noted with normal full range of motion of all joints.   Neurologic:  No cranial nerve deficits noted. Station and gait are normal. Plantar reflexes are down-going bilaterally. DTRs are symmetrical throughout. Sensory, motor and coordinative functions appear intact. Skin:  there is a 41m excoriation on the top of her left shoulder with no exudate, induration, swelling, ttp, warmth, FB, or streaking. Cervical Nodes:  no anterior cervical adenopathy and no posterior cervical adenopathy.   Axillary Nodes:  no R axillary adenopathy and no L axillary adenopathy.   Psych:  Cognition and judgment appear intact. Alert and cooperative with normal attention span and concentration. No apparent delusions, illusions, hallucinations   Impression & Recommendations:  Problem # 1:  HYPERGLYCEMIA (ICD-790.29) Assessment Unchanged  Orders: Venipuncture ((51884 TLB-Lipid Panel (80061-LIPID) TLB-BMP (Basic Metabolic Panel-BMET) (816606-[id number] TLB-CBC Platelet - w/Differential (85025-CBCD) TLB-Hepatic/Liver Function Pnl (80076-HEPATIC) TLB-TSH (Thyroid Stimulating Hormone) (84443-TSH) TLB-A1C / Hgb A1C (Glycohemoglobin) (83036-A1C)  Problem # 2:  HYPOKALEMIA, MILD (ICD-276.8) Assessment: Unchanged  Orders: Venipuncture ((93235 TLB-Lipid Panel (80061-LIPID) TLB-BMP (Basic Metabolic Panel-BMET) (857322-GURKYHC TLB-CBC Platelet - w/Differential  (85025-CBCD) TLB-Hepatic/Liver Function Pnl (80076-HEPATIC) TLB-TSH (Thyroid Stimulating Hormone) (84443-TSH) TLB-A1C / Hgb A1C (Glycohemoglobin) (83036-A1C)  Problem # 3:  GERD (ICD-530.81) Assessment: Improved  Her updated medication list for this problem includes:    Aciphex 20 Mg Tbec (Rabeprazole sodium) ..... Once daily for heartburn  Orders: Venipuncture ((62376 TLB-Lipid Panel (80061-LIPID) TLB-BMP (Basic Metabolic Panel-BMET) ([id number]-[id number] TLB-CBC Platelet - w/Differential (85025-CBCD) TLB-Hepatic/Liver Function Pnl (80076-HEPATIC) TLB-TSH (Thyroid Stimulating Hormone) (84443-TSH) TLB-A1C / Hgb A1C (Glycohemoglobin) (83036-A1C)  Labs Reviewed: Hgb: 12.6 (04/21/2010)   Hct: 36.4 (04/21/2010)  Problem # 4:  HYPOTHYROIDISM (ICD-244.9) Assessment: Unchanged  Her updated medication list for this problem includes:    Levoxyl 100 Mcg Tabs (Levothyroxine sodium) ..... One by mouth once daily  Orders: Venipuncture ((37106 TLB-Lipid Panel (80061-LIPID) TLB-BMP (Basic Metabolic Panel-BMET) ([id number]-[id number] TLB-CBC Platelet - w/Differential (85025-CBCD) TLB-Hepatic/Liver Function Pnl (80076-HEPATIC) TLB-TSH (Thyroid Stimulating Hormone) (84443-TSH) TLB-A1C / Hgb A1C (Glycohemoglobin) (83036-A1C)  Labs Reviewed: TSH: 1.42 (04/21/2010)    HgBA1c: 5.8 (04/21/2010) Chol: 124 (10/20/2009)   HDL: 33.80 (10/20/2009)   LDL: 64 (10/20/2009)   TG: 131.0 (10/20/2009)  Problem # 5:  HYPERTENSION (ICD-401.9) Assessment: Improved  Her updated medication list for this problem includes:    Metoprolol Succinate 25 Mg Xr24h-tab (Metoprolol succinate) .Marland Kitchen... Take 1 tab daily    Diovan Hct 160-12.5 Mg Tabs (Valsartan-hydrochlorothiazide) ..... One by mouth once daily for high blood pressure  Orders: Venipuncture (58527) TLB-Lipid Panel (80061-LIPID) TLB-BMP (Basic Metabolic Panel-BMET) (78242-PNTIRWE) TLB-CBC Platelet - w/Differential (85025-CBCD) TLB-Hepatic/Liver Function Pnl  (80076-HEPATIC) TLB-TSH (Thyroid Stimulating Hormone) (84443-TSH) TLB-A1C / Hgb A1C (Glycohemoglobin) (83036-A1C)  BP today: 112/62 Prior BP: 112/68 (06/23/2010)  Prior 10 Yr Risk Heart Disease: 8 % (02/17/2010)  Labs Reviewed: K+: 3.6 (04/21/2010) Creat: : 0.8 (04/21/2010)   Chol: 124 (10/20/2009)   HDL: 33.80 (10/20/2009)   LDL: 64 (10/20/2009)   TG: 131.0 (10/20/2009)  Problem # 6:  HYPERLIPIDEMIA (ICD-272.4) Assessment: Unchanged  Orders: Venipuncture (31540) TLB-Lipid Panel (80061-LIPID) TLB-BMP (Basic Metabolic Panel-BMET) (08676-[id number]) TLB-CBC Platelet - w/Differential (85025-CBCD) TLB-Hepatic/Liver Function Pnl (80076-HEPATIC) TLB-TSH (Thyroid Stimulating Hormone) (84443-TSH) TLB-A1C / Hgb A1C (Glycohemoglobin) (83036-A1C)  Labs Reviewed: SGOT: 17 (12/11/2009)   SGPT: 13 (12/11/2009)  Lipid Goals: Chol Goal: 200 (10/20/2009)   HDL Goal: 40 (10/20/2009)   LDL Goal: 130 (10/20/2009)   TG Goal: 150 (10/20/2009)  Prior 10 Yr Risk Heart Disease: 8 % (02/17/2010)   HDL:33.80 (10/20/2009)  LDL:64 (10/20/2009)  Chol:124 (10/20/2009)  Trig:131.0 (10/20/2009)  Problem # 7:  ANEMIA-NOS (ICD-285.9) Assessment: Unchanged  Orders: Venipuncture (67124) TLB-Lipid Panel (80061-LIPID) TLB-BMP (Basic Metabolic Panel-BMET) (58099-IPJASNK) TLB-CBC Platelet - w/Differential (85025-CBCD) TLB-Hepatic/Liver Function Pnl (80076-HEPATIC) TLB-TSH (Thyroid Stimulating Hormone) (84443-TSH) TLB-A1C / Hgb A1C (Glycohemoglobin) (83036-A1C)  Hgb: 12.6 (04/21/2010)   Hct: 36.4 (04/21/2010)   Platelets: 286.0 (04/21/2010) RBC: 4.63 (04/21/2010)   RDW: 14.4 (04/21/2010)   WBC: 8.0 (04/21/2010) MCV: 78.6 (04/21/2010)   MCHC: 34.5 (04/21/2010) Ferritin: 49.8 (04/25/2009) Iron: 50 (04/21/2010)   % Sat: 14.8 (04/21/2010) B12: 331 (04/21/2010)   Folate: 6.8 (04/21/2010)   TSH: 1.42 (04/21/2010)  Complete Medication List: 1)  Metoprolol Succinate 25 Mg Xr24h-tab (Metoprolol succinate) .... Take  1 tab daily 2)  Aciphex 20 Mg Tbec (Rabeprazole sodium) .... Once daily for heartburn 3)  Levoxyl 100 Mcg Tabs (Levothyroxine sodium) .... One by mouth once daily 4)  Diovan Hct 160-12.5 Mg Tabs (Valsartan-hydrochlorothiazide) .... One by mouth once daily for high blood pressure  Hypertension Assessment/Plan:      The patient's hypertensive risk group is category B: At least one risk factor (excluding diabetes) with no target organ damage.  Her calculated 10 year risk of coronary heart disease is 8 %.  Today's blood pressure is 112/62.  Her blood pressure goal is < 140/90.   Patient Instructions: 1)  Please schedule a follow-up appointment in 4 months. 2)  It is important that you exercise regularly at least 20 minutes 5 times a week. If you develop chest pain, have severe difficulty breathing, or feel very tired , stop exercising immediately and seek medical attention. 3)  You need to lose weight. Consider a lower calorie diet and regular exercise.  4)  Check your Blood Pressure regularly. If it is above 140/90: you should make an appointment.   Orders Added: 1)  Venipuncture [53976] 2)  TLB-Lipid Panel [80061-LIPID] 3)  TLB-BMP (Basic Metabolic Panel-BMET) [73419-[id number]] 4)  TLB-CBC Platelet - w/Differential [85025-CBCD] 5)  TLB-Hepatic/Liver Function Pnl [80076-HEPATIC] 6)  TLB-TSH (Thyroid Stimulating Hormone) [84443-TSH] 7)  TLB-A1C / Hgb A1C (Glycohemoglobin) [83036-A1C] 8)  Est. Patient Level V [97353]

## 2010-12-29 NOTE — Letter (Signed)
Summary: [facility] Instructions  [facility]  [place], [place] [address]   Phone: [phone]  Fax: [phone]       [name]    [birth year]    MRN: [id number]      Procedure Day Sudie GrumblingMarlana Latus, 05/06/10     Arrival Time: 10:30 AM      Procedure Time: 11:30 AM    Location of Procedure:                    _X_  [facility] (4th Floor)  [place]   Starting 5 days prior to your procedure 05/01/10 do not eat nuts, seeds, popcorn, corn, beans, peas,  salads, or any raw vegetables.  Do not take any fiber supplements (e.g. Metamucil, Citrucel, and Benefiber).  THE DAY BEFORE YOUR PROCEDURE         TUESDAY, 05/05/10  1.  Drink clear liquids the entire day-NO SOLID FOOD  2.  Do not drink anything colored red or purple.  Avoid juices with pulp.  No orange juice.  3.  Drink at least 64 oz. (8 glasses) of fluid/clear liquids during the day to prevent dehydration and help the prep work efficiently.  CLEAR LIQUIDS INCLUDE: Water Jello Ice Popsicles Tea (sugar ok, no milk/cream) Powdered fruit flavored drinks Coffee (sugar ok, no milk/cream) Gatorade Juice: apple, white grape, white cranberry  Lemonade Clear bullion, consomm, broth Carbonated beverages (any kind) Strained chicken noodle soup Hard Candy                             4.  In the morning, mix first dose of MoviPrep solution:    Empty 1 Pouch A and 1 Pouch B into the disposable container    Add lukewarm drinking water to the top line of the container. Mix to dissolve    Refrigerate (mixed solution should be used within 24 hrs)  5.  Begin drinking the prep at 5:00 p.m. The MoviPrep container is divided by 4 marks.   Every 15 minutes drink the solution down to the next mark (approximately 8 oz) until the full liter is complete.   6.  Follow completed prep with 16 oz of clear liquid of your choice (Nothing red or purple).  Continue to drink clear  liquids until bedtime.  7.  Before going to bed, mix second dose of MoviPrep solution:    Empty 1 Pouch A and 1 Pouch B into the disposable container    Add lukewarm drinking water to the top line of the container. Mix to dissolve    Refrigerate  THE DAY OF YOUR PROCEDURE      WEDNESDAY, 05/06/10  Beginning at 6:30 a.m. (5 hours before procedure):         1. Every 15 minutes, drink the solution down to the next mark (approx 8 oz) until the full liter is complete.  2. Follow completed prep with 16 oz. of clear liquid of your choice.    3. You may drink clear liquids until 9:30 AM (2 HOURS BEFORE PROCEDURE).  MEDICATION INSTRUCTIONS  Unless otherwise instructed, you should take regular prescription medications with a small sip of water   as early as possible the morning of your procedure.       OTHER INSTRUCTIONS  You will need a responsible adult at least 60 years of age to accompany you and drive you home.  This person must remain in the waiting room during your procedure.  Wear loose fitting clothing that is easily removed.  Leave jewelry and other valuables at home.  However, you may wish to bring a book to read or  an iPod/MP3 player to listen to music as you wait for your procedure to start.  Remove all body piercing jewelry and leave at home.  Total time from sign-in until discharge is approximately 2-3 hours.  You should go home directly after your procedure and rest.  You can resume normal activities the  day after your procedure.  The day of your procedure you should not:   Drive   Make legal decisions   Operate machinery   Drink alcohol   Return to work  You will receive specific instructions about eating, activities and medications before you leave.  The above instructions have been reviewed and explained to me by   _______________________   I fully understand and can verbalize these instructions _____________________________ Date _________

## 2010-12-29 NOTE — Assessment & Plan Note (Signed)
Summary: 2 WEEK FOLLOW UP-LB   Vital Signs:  Patient profile:   60 year old female Height:      67 inches Weight:      203 pounds BMI:     31.91 O2 Sat:      94 % on Room air Temp:     98.5 degrees F oral Pulse rate:   71 / minute Pulse rhythm:   regular Resp:     16 per minute BP sitting:   148 / 90  (left arm) Cuff size:   large  Vitals Entered By: Rebeca Alert (December 25, 2009 8:13 AM)  Nutrition Counseling: Patient's BMI is greater than 25 and therefore counseled on weight management options.  O2 Flow:  Room air CC: 2 week follow up/aj, Hypertension Management   Primary Care Provider:  [name] MD  CC:  2 week follow up/aj and Hypertension Management.  History of Present Illness: She returns for f/up and review of recent labs showed a mildly low potassium and slightly high TSH.  Hypertension History:      She denies headache, chest pain, palpitations, dyspnea with exertion, orthopnea, PND, peripheral edema, visual symptoms, neurologic problems, syncope, and side effects from treatment.  She notes no problems with any antihypertensive medication side effects.        Positive major cardiovascular risk factors include female age 61 years old or older, hyperlipidemia, and hypertension.  Negative major cardiovascular risk factors include no history of diabetes, negative family history for ischemic heart disease, and non-tobacco-user status.        Further assessment for target organ damage reveals no history of ASHD, cardiac end-organ damage (CHF/LVH), stroke/TIA, peripheral vascular disease, renal insufficiency, or hypertensive retinopathy.     Current Medications (verified): 1)  Hyzaar500-12.5 Mg Tabs (Losartan Potassium-Hctz) .... One Tablet By Mouth Once Daily 2)  Levoxyl 150 Mcg Tabs (Levothyroxine Sodium) .... 1/2  Tablet By Mouth Once Daily 3)  Metoprolol Succinate 25 Mg Xr24h-Tab (Metoprolol Succinate) .... Take 1 Tab Daily 4)  Aciphex 20 Mg Tbec  (Rabeprazole Sodium) .... Once Daily For Heartburn  Allergies (verified): 1)  ! Vicodin 2)  ! Asa 3)  ! * Contrast Dye 4)  ! Lipitor  Past History:  Past Medical History: Reviewed history from 07/30/2009 and no changes required. HYPOTHYROIDISM (ICD-244.9) HYPERTENSION (ICD-401.9) CHOLELITHIASIS (ICD-574.20) FIBROMYALGIA (ICD-729.1) CHRONIC BACK PAIN HYPERLIPIDEMIA CONSTIPATION    Past Surgical History: Reviewed history from 10/20/2009 and no changes required. Hysterectomy Herniated disk surgery Lumbar surgery Tonsillectomy  Family History: Reviewed history from 04/25/2009 and no changes required. Family History of Diabetes: Aunt, Uncles Family History of Breast Cancer:Aunt  Social History: Reviewed history from 04/25/2009 and no changes required. 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  Alcohol Use - no Daily Caffeine Use Illicit Drug Use - no  Review of Systems       The patient complains of weight gain.  The patient denies chest pain, syncope, dyspnea on exertion, peripheral edema, headaches, hemoptysis, and abdominal pain.   General:  Denies fatigue, loss of appetite, malaise, sweats, and weakness. Endo:  Complains of weight change; denies cold intolerance, excessive hunger, excessive thirst, excessive urination, heat intolerance, and polyuria.  Physical Exam  General:  alert, well-developed, well-nourished, well-hydrated, appropriate dress, normal appearance, healthy-appearing, cooperative to examination, good hygiene, and overweight-appearing.   Head:  normocephalic, atraumatic, and no abnormalities observed.   Mouth:  Oral mucosa and oropharynx without lesions or exudates.  Teeth in good repair. Neck:  supple,  full ROM, no masses, no thyromegaly, no thyroid nodules or tenderness, no JVD, normal carotid upstroke, no carotid bruits, and no cervical lymphadenopathy.   Lungs:  normal respiratory effort, no intercostal retractions, no accessory muscle use,  and normal breath sounds.   Heart:  normal rate, regular rhythm, no murmur, and no gallop.   Abdomen:  soft, non-tender, normal bowel sounds, no distention, no masses, no guarding, no rigidity, no hepatomegaly, and no splenomegaly.   Msk:  normal ROM, no joint tenderness, no joint swelling, no joint warmth, no redness over joints, no joint deformities, no joint instability, no crepitation, and no muscle atrophy.   Pulses:  R and L carotid,radial,femoral,dorsalis pedis and posterior tibial pulses are full and equal bilaterally Extremities:  No clubbing, cyanosis, edema, or deformity noted with normal full range of motion of all joints.   Neurologic:  No cranial nerve deficits noted. Station and gait are normal. Plantar reflexes are down-going bilaterally. DTRs are symmetrical throughout. Sensory, motor and coordinative functions appear intact. Skin:  turgor normal, color normal, no rashes, no suspicious lesions, no ecchymoses, no petechiae, no purpura, no ulcerations, and no edema.   Cervical Nodes:  no anterior cervical adenopathy and no posterior cervical adenopathy.   Psych:  Cognition and judgment appear intact. Alert and cooperative with normal attention span and concentration. No apparent delusions, illusions, hallucinations   Impression & Recommendations:  Problem # 1:  HYPOTHYROIDISM (ICD-244.9) Assessment Deteriorated  Her updated medication list for this problem includes:    Levoxyl 100 Mcg Tabs (Levothyroxine sodium) ..... One by mouth once daily  Problem # 2:  HYPERTENSION (ICD-401.9) Assessment: Unchanged  Her updated medication list for this problem includes:    Hyzaar 50-12.5 Mg Tabs (Losartan potassium-hctz) ..... One by mouth once daily for high blood pressure    Metoprolol Succinate 25 Mg Xr24h-tab (Metoprolol succinate) .Marland Kitchen... Take 1 tab daily  BP today: 148/90 Prior BP: 128/82 (12/11/2009)  10 Yr Risk Heart Disease: 17 % Prior 10 Yr Risk Heart Disease: 13 %  (12/11/2009)  Labs Reviewed: K+: 3.3 (12/11/2009) Creat: : 0.7 (12/11/2009)   Chol: 124 (10/20/2009)   HDL: 33.80 (10/20/2009)   LDL: 64 (10/20/2009)   TG: 131.0 (10/20/2009)  Problem # 3:  HYPERLIPIDEMIA (ICD-272.4) Assessment: Improved  Labs Reviewed: SGOT: 17 (12/11/2009)   SGPT: 13 (12/11/2009)  Lipid Goals: Chol Goal: 200 (10/20/2009)   HDL Goal: 40 (10/20/2009)   LDL Goal: 130 (10/20/2009)   TG Goal: 150 (10/20/2009)  10 Yr Risk Heart Disease: 17 % Prior 10 Yr Risk Heart Disease: 13 % (12/11/2009)   HDL:33.80 (10/20/2009)  LDL:64 (10/20/2009)  Chol:124 (10/20/2009)  Trig:131.0 (10/20/2009)  Problem # 4:  HYPOKALEMIA, MILD (ICD-276.8) Assessment: New start potassium replacement  Complete Medication List: 1)  Hyzaar 50-12.5 Mg Tabs (Losartan potassium-hctz) .... One by mouth once daily for high blood pressure 2)  Metoprolol Succinate 25 Mg Xr24h-tab (Metoprolol succinate) .... Take 1 tab daily 3)  Aciphex 20 Mg Tbec (Rabeprazole sodium) .... Once daily for heartburn 4)  Levoxyl 100 Mcg Tabs (Levothyroxine sodium) .... One by mouth once daily 5)  K-tabs 10 Meq Cr-tabs (Potassium chloride) .... One by mouth bid  Hypertension Assessment/Plan:      The patient's hypertensive risk group is category B: At least one risk factor (excluding diabetes) with no target organ damage.  Her calculated 10 year risk of coronary heart disease is 17 %.  Today's blood pressure is 148/90.  Her blood pressure goal is < 140/90.  Patient  Instructions: 1)  Please schedule a follow-up appointment in 2 months. 2)  It is important that you exercise regularly at least 20 minutes 5 times a week. If you develop chest pain, have severe difficulty breathing, or feel very tired , stop exercising immediately and seek medical attention. 3)  You need to lose weight. Consider a lower calorie diet and regular exercise.  4)  Check your Blood Pressure regularly. If it is above: you should make an  appointment. Prescriptions: K-TABS 10 MEQ CR-TABS (POTASSIUM CHLORIDE) One by mouth BID  #60 x 11   Entered and Authorized by:   [name] MD   Signed by:   [name] MD on 12/25/2009   Method used:   Print then Give to Patient   RxID:   [id number] HYZAAR 50-12.5 MG TABS (LOSARTAN POTASSIUM-HCTZ) one by mouth once daily for high blood pressure  #30 x 11   Entered and Authorized by:   [name] MD   Signed by:   [name] MD on 12/25/2009   Method used:   Electronically to        [facility] [address] [phone]* (retail)       [address]       [place], [place]  [address]       Ph: [phone] or [phone]       Fax: [phone]   RxID:   [id number] LEVOXYL 100 MCG TABS (LEVOTHYROXINE SODIUM) One by mouth once daily  #30 x 5   Entered and Authorized by:   [name] MD   Signed by:   [name] MD on 12/25/2009   Method used:   Print then Give to Patient   RxID:   [id number]

## 2010-12-29 NOTE — Procedures (Signed)
Summary: Colonoscopy  Patient: [name] Note: All result statuses are Final unless otherwise noted.  Tests: (1) Colonoscopy (COL)   COL Colonoscopy           [facility]     [place], [place]  [address]           COLONOSCOPY PROCEDURE REPORT           PATIENT:  [name], [name]  MR#:  [id number]     BIRTHDATE:  November 11, 1951, 89 yrs. old  GENDER:  female     ENDOSCOPIST:  Loralee Pacas. Sharlett Iles, MD, [facility]     REF. BY:     PROCEDURE DATE:  05/06/2010     PROCEDURE:  Colonoscopy with snare polypectomy     ASA CLASS:  Class II     INDICATIONS:  Iron Deficiency Anemia     MEDICATIONS:   Fentanyl 100 mcg IV, Versed 13 mg           DESCRIPTION OF PROCEDURE:   After the risks benefits and     alternatives of the procedure were thoroughly explained, informed     consent was obtained.  Digital rectal exam was performed and     revealed no abnormalities.   The LB [id number] endoscope     was introduced through the anus and advanced to the terminal ileum     which was intubated for a short distance, limited by stenosis, a     redundant colon.  "FIXED SIGMOID" AREA.  The quality of the prep     was excellent, using MoviPrep.  The instrument was then slowly     withdrawn as the colon was fully examined.     <<PROCEDUREIMAGES>>           FINDINGS:  A sessile polyp was found at the hepatic flexure. 3 CM     POLYP HOT SNARE EXCISED,THEN DIVIDED AND ASPIRATED AND SENT TO     PATH.  This was otherwise a normal examination of the colon. VERY     DIFFICULT EXAM PER ADHESIONS AND COLON REDUNDANCY.FUTURE EXAM BEST     DONE WITH PROPOFOL SEDATION.   Retroflexed views in the rectum     revealed hemorrhoids.    The scope was then withdrawn from the     patient and the procedure completed.           COMPLICATIONS:  None     ENDOSCOPIC IMPRESSION:     1) Sessile polyp at the hepatic flexure     2) Otherwise normal examination     3) Hemorrhoids     R/O CA.  RECOMMENDATIONS:     1) Await pathology results     2) Repeat Colonscopy in 3-5 years.     REPEAT EXAM:  No           ______________________________     Loralee Pacas. Sharlett Iles, MD, [facility]           CC:  [name], MD           n.     Lorrin MaisMarland Kitchen   Loralee Pacas. Patterson at 05/06/2010 12:02 PM           [place], [place], [id number]  Note: An exclamation mark (!) indicates a result that was not dispersed into the flowsheet. Document Creation Date: 05/06/2010 12:04 PM _______________________________________________________________________  (1) Order result status: Final Collection or observation date-time: 05/06/2010 11:53 Requested  date-time:  Receipt date-time:  Reported date-time:  Referring Physician:   Ordering Physician: Verl Blalock [phone]) Specimen Source:  Source: Tawanna Cooler Order Number: [id number] Lab site:   Appended Document: Colonoscopy     Procedures Next Due Date:    Colonoscopy: 04/2013

## 2010-12-29 NOTE — Assessment & Plan Note (Signed)
Summary: ARM PAIN /NWS  #   Vital Signs:  Patient profile:   60 year old female Height:      67 inches Weight:      204 pounds BMI:     32.07 O2 Sat:      98 % on Room air Temp:     98.0 degrees F oral Pulse rate:   66 / minute Pulse rhythm:   regular Resp:     16 per minute BP sitting:   128 / 82  (left arm) Cuff size:   large  Vitals Entered By: Estell Harpin CMA (December 11, 2009 9:28 AM)  Nutrition Counseling: Patient's BMI is greater than 25 and therefore counseled on weight management options.  O2 Flow:  Room air CC: arm, ankle, hand pain, Lipid Management, Hypertension Management, Abdominal Pain Pain Assessment Patient in pain? yes        Primary Care Provider:  [name] MD  CC:  arm, ankle, hand pain, Lipid Management, Hypertension Management, and Abdominal Pain.  History of Present Illness: She returns c/o worsening myalgias over the last year.  Dyspepsia History:      She has no alarm features of dyspepsia including no history of melena, hematochezia, dysphagia, persistent vomiting, or involuntary weight loss > 5%.  There is a prior history of GERD.  She notes that it has been less than 12 months since the last episode of GERD.  The patient does not have a prior history of documented ulcer disease.  The dominant symptom is heartburn or acid reflux.  An H-2 blocker medication is currently being taken.  She notes that the symptoms have improved with the H-2 blocker therapy.  Symptoms have not persisted after 4 weeks of H-2 blocker treatment.  No previous upper endoscopy has been done.    Hypertension History:      She denies headache, chest pain, palpitations, dyspnea with exertion, orthopnea, PND, peripheral edema, visual symptoms, neurologic problems, syncope, and side effects from treatment.  She notes no problems with any antihypertensive medication side effects.        Positive major cardiovascular risk factors include female age 5 years old or older,  hyperlipidemia, and hypertension.  Negative major cardiovascular risk factors include no history of diabetes, negative family history for ischemic heart disease, and non-tobacco-user status.        Further assessment for target organ damage reveals no history of ASHD, cardiac end-organ damage (CHF/LVH), stroke/TIA, peripheral vascular disease, renal insufficiency, or hypertensive retinopathy.    Lipid Management History:      Positive NCEP/ATP III risk factors include female age 84 years old or older, HDL cholesterol less than 40, and hypertension.  Negative NCEP/ATP III risk factors include non-diabetic, no family history for ischemic heart disease, non-tobacco-user status, no ASHD (atherosclerotic heart disease), no prior stroke/TIA, no peripheral vascular disease, and no history of aortic aneurysm.        The patient states that she knows about the "Therapeutic Lifestyle Change" diet.  Her compliance with the TLC diet is fair.  The patient expresses understanding of adjunctive measures for cholesterol lowering.  Adjunctive measures started by the patient include aerobic exercise, fiber, omega-3 supplements, and limit alcohol consumpton.  She notes side effects from her lipid-lowering medication.  The patient notes symptoms to suggest myopathy or liver disease.       Current Medications (verified): 1)  Hyzaar500-12.5 Mg Tabs (Losartan Potassium-Hctz) .... One Tablet By Mouth Once Daily 2)  Levoxyl 150 Mcg  Tabs (Levothyroxine Sodium) .... One Tablet By Mouth Once Daily 3)  Metoprolol Succinate 25 Mg Xr24h-Tab (Metoprolol Succinate) .... Take 1 Tab Daily 4)  Lipitor 20 Mg Tabs (Atorvastatin Calcium) .... Take 1 Tab Daily 5)  Aciphex 20 Mg Tbec (Rabeprazole Sodium) .... Once Daily For Heartburn  Allergies (verified): 1)  ! Vicodin 2)  ! Asa 3)  ! * Contrast Dye 4)  ! Lipitor  Past History:  Past Medical History: Reviewed history from 07/30/2009 and no changes required. HYPOTHYROIDISM  (ICD-244.9) HYPERTENSION (ICD-401.9) CHOLELITHIASIS (ICD-574.20) FIBROMYALGIA (ICD-729.1) CHRONIC BACK PAIN HYPERLIPIDEMIA CONSTIPATION    Past Surgical History: Reviewed history from 10/20/2009 and no changes required. Hysterectomy Herniated disk surgery Lumbar surgery Tonsillectomy  Family History: Reviewed history from 04/25/2009 and no changes required. Family History of Diabetes: Aunt, Uncles Family History of Breast Cancer:Aunt  Social History: Reviewed history from 04/25/2009 and no changes required. 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  Alcohol Use - no Daily Caffeine Use Illicit Drug Use - no  Review of Systems       The patient complains of weight gain.  The patient denies anorexia, fever, abdominal pain, melena, hematochezia, severe indigestion/heartburn, hematuria, suspicious skin lesions, difficulty walking, enlarged lymph nodes, and angioedema.   MS:  Complains of joint pain, loss of strength, muscle aches, cramps, muscle weakness, and stiffness; denies joint redness, joint swelling, low back pain, mid back pain, and thoracic pain.  Physical Exam  General:  alert, well-developed, well-nourished, well-hydrated, appropriate dress, normal appearance, healthy-appearing, cooperative to examination, good hygiene, and overweight-appearing.   Mouth:  Oral mucosa and oropharynx without lesions or exudates.  Teeth in good repair. Neck:  supple, full ROM, no masses, no thyromegaly, no thyroid nodules or tenderness, no JVD, normal carotid upstroke, no carotid bruits, and no cervical lymphadenopathy.   Lungs:  normal respiratory effort, no intercostal retractions, no accessory muscle use, and normal breath sounds.   Heart:  normal rate, regular rhythm, no murmur, and no gallop.   Abdomen:  soft, non-tender, normal bowel sounds, no distention, no masses, no guarding, no rigidity, no hepatomegaly, and no splenomegaly.   Msk:  normal ROM, no joint tenderness, no joint  swelling, no joint warmth, no redness over joints, no joint deformities, no joint instability, no crepitation, and no muscle atrophy.   Pulses:  R and L carotid,radial,femoral,dorsalis pedis and posterior tibial pulses are full and equal bilaterally Extremities:  No clubbing, cyanosis, edema, or deformity noted with normal full range of motion of all joints.   Neurologic:  No cranial nerve deficits noted. Station and gait are normal. Plantar reflexes are down-going bilaterally. DTRs are symmetrical throughout. Sensory, motor and coordinative functions appear intact. Skin:  turgor normal, color normal, no rashes, no suspicious lesions, no ecchymoses, no petechiae, no purpura, no ulcerations, and no edema.   Cervical Nodes:  no anterior cervical adenopathy and no posterior cervical adenopathy.   Psych:  Cognition and judgment appear intact. Alert and cooperative with normal attention span and concentration. No apparent delusions, illusions, hallucinations   Impression & Recommendations:  Problem # 1:  GERD (ICD-530.81) Assessment Improved  Her updated medication list for this problem includes:    Aciphex 20 Mg Tbec (Rabeprazole sodium) ..... Once daily for heartburn  Labs Reviewed: Hgb: 12.3 (10/20/2009)   Hct: 37.5 (10/20/2009)  Problem # 2:  HYPERLIPIDEMIA (ICD-272.4) Assessment: Deteriorated  will stop lipitor due to myalgias The following medications were removed from the medication list:    Lipitor 20 Mg Tabs (Atorvastatin  calcium) .Marland Kitchen... Take 1 tab daily  Orders: Venipuncture (74081) TLB-BMP (Basic Metabolic Panel-BMET) (44818-HUDJSHF) TLB-Hepatic/Liver Function Pnl (80076-HEPATIC) TLB-TSH (Thyroid Stimulating Hormone) (84443-TSH) TLB-CK Total Only(Creatine Kinase/CPK) (82550-CK)  Labs Reviewed: SGOT: 17 (10/20/2009)   SGPT: 12 (10/20/2009)  Lipid Goals: Chol Goal: 200 (10/20/2009)   HDL Goal: 40 (10/20/2009)   LDL Goal: 130 (10/20/2009)   TG Goal: 150 (10/20/2009)  10 Yr  Risk Heart Disease: 13 % Prior 10 Yr Risk Heart Disease: Not enough information (10/20/2009)   HDL:33.80 (10/20/2009)  LDL:64 (10/20/2009)  Chol:124 (10/20/2009)  Trig:131.0 (10/20/2009)  Problem # 3:  HYPERTENSION (ICD-401.9) Assessment: Improved  Her updated medication list for this problem includes:    Metoprolol Succinate 25 Mg Xr24h-tab (Metoprolol succinate) .Marland Kitchen... Take 1 tab daily  Orders: Venipuncture (02637) TLB-BMP (Basic Metabolic Panel-BMET) (85885-OYDXAJO) TLB-Hepatic/Liver Function Pnl (80076-HEPATIC) TLB-TSH (Thyroid Stimulating Hormone) (84443-TSH) TLB-CK Total Only(Creatine Kinase/CPK) (82550-CK)  BP today: 128/82 Prior BP: 128/72 (10/20/2009)  10 Yr Risk Heart Disease: 13 % Prior 10 Yr Risk Heart Disease: Not enough information (10/20/2009)  Labs Reviewed: K+: 3.9 (10/20/2009) Creat: : 0.8 (10/20/2009)   Chol: 124 (10/20/2009)   HDL: 33.80 (10/20/2009)   LDL: 64 (10/20/2009)   TG: 131.0 (10/20/2009)  Complete Medication List: 1)  Hyzaar500-12.5 Mg Tabs (losartan Potassium-hctz)  .... One tablet by mouth once daily 2)  Levoxyl 150 Mcg Tabs (levothyroxine Sodium)  .... 1/2  tablet by mouth once daily 3)  Metoprolol Succinate 25 Mg Xr24h-tab (Metoprolol succinate) .... Take 1 tab daily 4)  Aciphex 20 Mg Tbec (Rabeprazole sodium) .... Once daily for heartburn  Other Orders: Admin 1st Vaccine (87867) Flu Vaccine 33yr + ((67209  Hypertension Assessment/Plan:      The patient's hypertensive risk group is category B: At least one risk factor (excluding diabetes) with no target organ damage.  Her calculated 10 year risk of coronary heart disease is 13 %.  Today's blood pressure is 128/82.  Her blood pressure goal is < 140/90.  Lipid Assessment/Plan:      Based on NCEP/ATP III, the patient's risk factor category is "2 or more risk factors and a calculated 10 year CAD risk of < 20%".  The patient's lipid goals are as follows: Total cholesterol goal is 200; LDL  cholesterol goal is 130; HDL cholesterol goal is 40; Triglyceride goal is 150.    Patient Instructions: 1)  Please schedule a follow-up appointment in 4 months. 2)  It is important that you exercise regularly at least 20 minutes 5 times a week. If you develop chest pain, have severe difficulty breathing, or feel very tired , stop exercising immediately and seek medical attention. 3)  You need to lose weight. Consider a lower calorie diet and regular exercise.  4)  Check your Blood Pressure regularly. If it is above 140/90: you should make an appointment. Flu Vaccine Consent Questions     Do you have a history of severe allergic reactions to this vaccine? no    Any prior history of allergic reactions to egg and/or gelatin? no    Do you have a sensitivity to the preservative Thimersol? no    Do you have a past history of Guillan-Barre Syndrome? no    Do you currently have an acute febrile illness? no    Have you ever had a severe reaction to latex? no    Vaccine information given and explained to patient? yes    Are you currently pregnant? no    Lot Number:[id number]   Exp Date:05/28/2010  Site Given  Left Deltoid IM

## 2010-12-29 NOTE — Progress Notes (Signed)
Summary: speak to nurse  Phone Note Call from Patient Call back at Home Phone [phone]   Caller: Patient Call For: Sharlett Iles Reason for Call: Talk to Nurse Summary of Call: Patient having a procedure tomorrow and has a  questions for the nurse. Initial call taken by: Ronalee Red,  May 05, 2010 4:12 PM  Follow-up for Phone Call        pt. wanted to know results of labwork ordered by Dr. [name]. Told pt. i would check to see if lab work back , print it out for Dr. Sharlett Iles to talk w/ pt. about tomorrow when she comes in for her colonoscopy.Also told her she could call Dr. [name]' office to get these lab results since I could not go over these results w/ her since our Dr. did not order labs that they could discuss that w/ her. Follow-up by: Rush Barer RN,  May 05, 2010 4:22 PM

## 2010-12-29 NOTE — Letter (Signed)
Summary: Results Follow-up Letter  [facility]   [place], [place] [address]   Phone: [phone]  Fax: [phone]    12/12/2009  [place], [place]  [address]  Dear Ms. Burnett,   The following are the results of your recent test(s):  Test     Result     Potassium     low Kidney     normal Thyroid           dose is too low Liver       normal Muscle enzyme   normal   _________________________________________________________  Please call for an appointment in 2 weeks _________________________________________________________ _________________________________________________________ _________________________________________________________  Sincerely,  [name] MD [facility]

## 2010-12-29 NOTE — Letter (Signed)
Summary: Patient Notice- Polyp Results  [facility]  [address] [place], [place] [address]   Phone: [phone]  Fax: [phone]        May 08, 2010 MRN: [id number]    [place], [place]  [address]    Dear [name],  I am pleased to inform you that the colon polyp(s) removed during your recent colonoscopy was (were) found to be benign (no cancer detected) upon pathologic examination.  I recommend you have a repeat colonoscopy examination in 3_ years to look for recurrent polyps, as having colon polyps increases your risk for having recurrent polyps or even colon cancer in the future.  Should you develop new or worsening symptoms of abdominal pain, bowel habit changes or bleeding from the rectum or bowels, please schedule an evaluation with either your primary care physician or with me.  Additional information/recommendations:  xx__ No further action with gastroenterology is needed at this time. Please      follow-up with your primary care physician for your other healthcare      needs.  __ Please call [phone] to schedule a return visit to review your      situation.  __ Please keep your follow-up visit as already scheduled.  __ Continue treatment plan as outlined the day of your exam.  Please call us if you are having persistent problems or have questions about your condition that have not been fully answered at this time.  Sincerely,  Sable Feil MD [facility]  This letter has been electronically signed by your physician.  Appended Document: Patient Notice- Polyp Results letter mailed.

## 2010-12-29 NOTE — Assessment & Plan Note (Signed)
Summary: 2 MTH FU--STC   Vital Signs:  Patient profile:   60 year old female Height:      67 inches Weight:      203 pounds BMI:     31.91 O2 Sat:      97 % on Room air Temp:     98.0 degrees F oral Pulse rate:   70 / minute Pulse rhythm:   regular Resp:     16 per minute BP sitting:   110 / 80  (left arm) Cuff size:   large  Vitals Entered By: Estell Harpin CMA (Apr 21, 2010 8:21 AM)  Nutrition Counseling: Patient's BMI is greater than 25 and therefore counseled on weight management options.  O2 Flow:  Room air CC: follow-up visit 78moIs Patient Diabetic? No Pain Assessment Patient in pain? no        Primary Care Provider:  TJanith LimaMD  CC:  follow-up visit 274mo History of Present Illness:  Follow-Up Visit      This is a [age]ear old woman who presents for Follow-up visit.  The patient denies chest pain, palpitations, dizziness, syncope, low blood sugar symptoms, high blood sugar symptoms, edema, SOB, DOE, PND, and orthopnea.  Since the last visit the patient notes no new problems or concerns.  The patient reports taking meds as prescribed, monitoring BP, and dietary compliance.  When questioned about possible medication side effects, the patient notes none.    Dyspepsia History:      She has no alarm features of dyspepsia including no history of melena, hematochezia, dysphagia, persistent vomiting, or involuntary weight loss > 5%.  There is a prior history of GERD.  The patient does not have a prior history of documented ulcer disease.  The dominant symptom is heartburn or acid reflux.  An H-2 blocker medication is currently being taken.  She notes that the symptoms have improved with the H-2 blocker therapy.  Symptoms have not persisted after 4 weeks of H-2 blocker treatment.     Preventive Screening-Counseling & Management  Alcohol-Tobacco     Alcohol drinks/day: 0     Smoking Status: never  Hep-HIV-STD-Contraception     Hepatitis Risk: no risk noted   HIV Risk: no risk noted     STD Risk: no risk noted     SBE monthly: yes     SBE Education/Counseling: to perform regular SBE  Clinical Review Panels:  Diabetes Management   Creatinine:  0.9 (02/17/2010)   Last Flu Vaccine:  Fluvax 3+ (12/11/2009)  CBC   WBC:  7.0 (02/17/2010)   RBC:  4.40 (02/17/2010)   Hgb:  11.7 (02/17/2010)   Hct:  35.9 (02/17/2010)   Platelets:  307.0 (02/17/2010)   MCV  81.7 (02/17/2010)   MCHC  32.7 (02/17/2010)   RDW  13.2 (02/17/2010)   PMN:  57.5 (02/17/2010)   Lymphs:  34.1 (02/17/2010)   Monos:  5.2 (02/17/2010)   Eosinophils:  2.1 (02/17/2010)   Basophil:  1.1 (02/17/2010)  Complete Metabolic Panel   Glucose:  110 (02/17/2010)   Sodium:  143 (02/17/2010)   Potassium:  3.6 (02/17/2010)   Chloride:  106 (02/17/2010)   CO2:  32 (02/17/2010)   BUN:  8 (02/17/2010)   Creatinine:  0.9 (02/17/2010)   Albumin:  3.8 (12/11/2009)   Total Protein:  7.8 (12/11/2009)   Calcium:  9.1 (02/17/2010)   Total Bili:  0.8 (12/11/2009)   Alk Phos:  116 (12/11/2009)   SGPT (ALT):  13 (12/11/2009)   SGOT (AST):  17 (12/11/2009)   Medications Prior to Update: 1)  Metoprolol Succinate 25 Mg Xr24h-Tab (Metoprolol Succinate) .... Take 1 Tab Daily 2)  Aciphex 20 Mg Tbec (Rabeprazole Sodium) .... Once Daily For Heartburn 3)  Levoxyl 100 Mcg Tabs (Levothyroxine Sodium) .... One By Mouth Once Daily 4)  Diovan Hct 160-12.5 Mg Tabs (Valsartan-Hydrochlorothiazide) .... One By Mouth Once Daily For High Blood Pressure  Current Medications (verified): 1)  Metoprolol Succinate 25 Mg Xr24h-Tab (Metoprolol Succinate) .... Take 1 Tab Daily 2)  Aciphex 20 Mg Tbec (Rabeprazole Sodium) .... Once Daily For Heartburn 3)  Levoxyl 100 Mcg Tabs (Levothyroxine Sodium) .... One By Mouth Once Daily 4)  Diovan Hct 160-12.5 Mg Tabs (Valsartan-Hydrochlorothiazide) .... One By Mouth Once Daily For High Blood Pressure  Allergies (verified): 1)  ! Vicodin 2)  ! Asa 3)  ! * Contrast  Dye 4)  ! Lipitor  Past History:  Past Medical History: HYPOTHYROIDISM (ICD-244.9) HYPERTENSION (ICD-401.9) CHOLELITHIASIS (ICD-574.20) FIBROMYALGIA (ICD-729.1) CHRONIC BACK PAIN HYPERLIPIDEMIA CONSTIPATION   Anemia-NOS  Past Surgical History: Reviewed history from 10/20/2009 and no changes required. Hysterectomy Herniated disk surgery Lumbar surgery Tonsillectomy  Family History: Reviewed history from 04/25/2009 and no changes required. Family History of Diabetes: Aunt, Uncles Family History of Breast Cancer:Aunt  Social History: Reviewed history from 04/25/2009 and no changes required. 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  Alcohol Use - no Daily Caffeine Use Illicit Drug Use - no  Review of Systems  The patient denies chest pain, syncope, dyspnea on exertion, peripheral edema, prolonged cough, headaches, hemoptysis, abdominal pain, hematuria, suspicious skin lesions, abnormal bleeding, and enlarged lymph nodes.   Endo:  Denies cold intolerance, excessive hunger, excessive thirst, excessive urination, heat intolerance, polyuria, and weight change. Heme:  Denies abnormal bruising, bleeding, enlarge lymph nodes, fevers, pallor, and skin discoloration.  Physical Exam  General:  alert, well-developed, well-nourished, well-hydrated, appropriate dress, normal appearance, healthy-appearing, and cooperative to examination.   Head:  normocephalic, atraumatic, no abnormalities observed, and no abnormalities palpated.   Mouth:  Oral mucosa and oropharynx without lesions or exudates.  Teeth in good repair. Neck:  supple, full ROM, no masses, no thyromegaly, no thyroid nodules or tenderness, no JVD, normal carotid upstroke, no carotid bruits, no cervical lymphadenopathy, and no neck tenderness.   Lungs:  normal respiratory effort, no intercostal retractions, no accessory muscle use, normal breath sounds, no dullness, no fremitus, no crackles, and no wheezes.   Heart:   normal rate, regular rhythm, no murmur, no gallop, no rub, and no JVD.   Abdomen:  soft, non-tender, normal bowel sounds, no distention, no masses, no guarding, no rigidity, no rebound tenderness, no hepatomegaly, and no splenomegaly.   Msk:  No deformity or scoliosis noted of thoracic or lumbar spine.   Extremities:  No clubbing, cyanosis, edema, or deformity noted with normal full range of motion of all joints.   Neurologic:  No cranial nerve deficits noted. Station and gait are normal. Plantar reflexes are down-going bilaterally. DTRs are symmetrical throughout. Sensory, motor and coordinative functions appear intact. Skin:  Intact without suspicious lesions or rashes Cervical Nodes:  no anterior cervical adenopathy and no posterior cervical adenopathy.   Axillary Nodes:  no R axillary adenopathy and no L axillary adenopathy.   Psych:  Cognition and judgment appear intact. Alert and cooperative with normal attention span and concentration. No apparent delusions, illusions, hallucinations   Impression & Recommendations:  Problem # 1:  ANEMIA-NOS (ICD-285.9) Assessment New  Orders: Venipuncture (34193) TLB-BMP (Basic Metabolic Panel-BMET) (79024-OXBDZHG) TLB-CBC Platelet - w/Differential (85025-CBCD) TLB-TSH (Thyroid Stimulating Hormone) (84443-TSH) TLB-B12 + Folate Pnl (99242_68341-D62/IWL) TLB-IBC Pnl (Iron/FE;Transferrin) (83550-IBC) TLB-A1C / Hgb A1C (Glycohemoglobin) (83036-A1C) Gastroenterology Referral (GI)  Hgb: 11.7 (02/17/2010)   Hct: 35.9 (02/17/2010)   Platelets: 307.0 (02/17/2010) RBC: 4.40 (02/17/2010)   RDW: 13.2 (02/17/2010)   WBC: 7.0 (02/17/2010) MCV: 81.7 (02/17/2010)   MCHC: 32.7 (02/17/2010) Ferritin: 49.8 (04/25/2009) Iron: 44 (04/25/2009)   % Sat: 12.1 (04/25/2009) B12: 502 (04/25/2009)   Folate: 8.8 (04/25/2009)   TSH: 1.42 (02/17/2010)  Problem # 2:  HYPERGLYCEMIA (ICD-790.29) Assessment: New  Orders: Venipuncture (79892) TLB-BMP (Basic Metabolic  Panel-BMET) (11941-DEYCXKG) TLB-CBC Platelet - w/Differential (85025-CBCD) TLB-TSH (Thyroid Stimulating Hormone) (84443-TSH) TLB-B12 + Folate Pnl (81856_31497-W26/VZC) TLB-IBC Pnl (Iron/FE;Transferrin) (83550-IBC) TLB-A1C / Hgb A1C (Glycohemoglobin) (83036-A1C)  Labs Reviewed: Creat: 0.9 (02/17/2010)     Problem # 3:  HYPOKALEMIA, MILD (ICD-276.8) Assessment: Unchanged  Orders: Venipuncture (58850) TLB-BMP (Basic Metabolic Panel-BMET) (27741-OINOMVE) TLB-CBC Platelet - w/Differential (85025-CBCD) TLB-TSH (Thyroid Stimulating Hormone) (84443-TSH) TLB-B12 + Folate Pnl (72094_70962-E36/OQH) TLB-IBC Pnl (Iron/FE;Transferrin) (83550-IBC) TLB-A1C / Hgb A1C (Glycohemoglobin) (83036-A1C)  Problem # 4:  HYPOTHYROIDISM (ICD-244.9) Assessment: Unchanged  Her updated medication list for this problem includes:    Levoxyl 100 Mcg Tabs (Levothyroxine sodium) ..... One by mouth once daily  Orders: Venipuncture (47654) TLB-BMP (Basic Metabolic Panel-BMET) (65035-WSFKCLE) TLB-CBC Platelet - w/Differential (85025-CBCD) TLB-TSH (Thyroid Stimulating Hormone) (84443-TSH) TLB-B12 + Folate Pnl (75170_01749-S49/QPR) TLB-IBC Pnl (Iron/FE;Transferrin) (83550-IBC) TLB-A1C / Hgb A1C (Glycohemoglobin) (83036-A1C)  Labs Reviewed: TSH: 1.42 (02/17/2010)    Chol: 124 (10/20/2009)   HDL: 33.80 (10/20/2009)   LDL: 64 (10/20/2009)   TG: 131.0 (10/20/2009)  Problem # 5:  HYPERTENSION (ICD-401.9) Assessment: Improved  Her updated medication list for this problem includes:    Metoprolol Succinate 25 Mg Xr24h-tab (Metoprolol succinate) .Marland Kitchen... Take 1 tab daily    Diovan Hct 160-12.5 Mg Tabs (Valsartan-hydrochlorothiazide) ..... One by mouth once daily for high blood pressure  Orders: Venipuncture (91638) TLB-BMP (Basic Metabolic Panel-BMET) (46659-DJTTSVX) TLB-CBC Platelet - w/Differential (85025-CBCD) TLB-TSH (Thyroid Stimulating Hormone) (84443-TSH) TLB-B12 + Folate Pnl (79390_30092-Z30/QTM) TLB-IBC  Pnl (Iron/FE;Transferrin) (83550-IBC) TLB-A1C / Hgb A1C (Glycohemoglobin) (83036-A1C)  BP today: 110/80 Prior BP: 184/110 (04/08/2010)  Prior 10 Yr Risk Heart Disease: 8 % (02/17/2010)  Labs Reviewed: K+: 3.6 (02/17/2010) Creat: : 0.9 (02/17/2010)   Chol: 124 (10/20/2009)   HDL: 33.80 (10/20/2009)   LDL: 64 (10/20/2009)   TG: 131.0 (10/20/2009)  Complete Medication List: 1)  Metoprolol Succinate 25 Mg Xr24h-tab (Metoprolol succinate) .... Take 1 tab daily 2)  Aciphex 20 Mg Tbec (Rabeprazole sodium) .... Once daily for heartburn 3)  Levoxyl 100 Mcg Tabs (Levothyroxine sodium) .... One by mouth once daily 4)  Diovan Hct 160-12.5 Mg Tabs (Valsartan-hydrochlorothiazide) .... One by mouth once daily for high blood pressure  PAP Screening:    Last PAP smear:  07/30/2009  Mammogram Screening:    Last Mammogram:  07/30/2009  Osteoporosis Risk Assessment:  Risk Factors for Fracture or Low Bone Density:   Hx of Fractures:       no   FH of Osteoporosis:     no   Hx of falls:       no   Physically inactive:     no   Smoking status:       never   High alcohol use:     no   Low calcium/Vit. D intake:   no   Corticosteroid use:  no   Thyroid replacement:     yes   Heparin use:       no   Thyroid disease:     yes   Rheumatoid Arthritis:     no  Immunization & Chemoprophylaxis:    Influenza vaccine: Fluvax 3+  (12/11/2009)  Patient Instructions: 1)  Please schedule a follow-up appointment in 2 months. 2)  It is important that you exercise regularly at least 20 minutes 5 times a week. If you develop chest pain, have severe difficulty breathing, or feel very tired , stop exercising immediately and seek medical attention. 3)  You need to lose weight. Consider a lower calorie diet and regular exercise.  4)  Check your Blood Pressure regularly. If it is above 140/90: you should make an appointment.

## 2010-12-29 NOTE — Letter (Signed)
Summary: Results Follow-up Letter  [facility] [address]   [place], [place] [address]   Phone: [phone]  Fax: [phone]    02/18/2010  [address] [place], [place]  [address]  Dear Ms. Deschene,   The following are the results of your recent test(s):  Test     Result     Blood sugar     slightly elevated Kidney     normal CBC       mild anemia Thyroid     normal   _________________________________________________________  Please call for an appointment as directed _________________________________________________________ _________________________________________________________ _________________________________________________________  Sincerely,  [name] MD [facility]

## 2010-12-29 NOTE — Assessment & Plan Note (Signed)
Summary: bp: this am-- 208/113--headaches---stc   Vital Signs:  Patient profile:   60 year old female Height:      67 inches Weight:      208.50 pounds BMI:     32.77 O2 Sat:      98 % on Room air Temp:     98.5 degrees F oral Pulse rate:   68 / minute Pulse rhythm:   regular Resp:     16 per minute BP sitting:   184 / 110  (left arm) Cuff size:   large  Vitals Entered By: Estell Harpin CMA (Apr 08, 2010 10:01 AM)  Nutrition Counseling: Patient's BMI is greater than 25 and therefore counseled on weight management options.  O2 Flow:  Room air CC: elevated BP and headache x 04/05/2010, Abdominal Pain Is Patient Diabetic? No Pain Assessment Patient in pain? yes     Location: head   Primary Care Provider:  [name] MD  CC:  elevated BP and headache x 04/05/2010 and Abdominal Pain.  History of Present Illness:  Hypertension Follow-Up      This is a 60 year old woman who presents for Hypertension follow-up.  The patient reports headaches, but denies lightheadedness, urinary frequency, edema, rash, and fatigue.  The patient denies the following associated symptoms: chest pain, chest pressure, exercise intolerance, dyspnea, palpitations, syncope, leg edema, and pedal edema.  Compliance with medications (by patient report) has been near 100%.  The patient reports that dietary compliance has been fair.  The patient reports exercising occasionally.    Dyspepsia History:      She has no alarm features of dyspepsia including no history of melena, hematochezia, dysphagia, persistent vomiting, or involuntary weight loss > 5%.  There is a prior history of GERD.  The patient does not have a prior history of documented ulcer disease.  The dominant symptom is heartburn or acid reflux.  An H-2 blocker medication is currently being taken.  She notes that the symptoms have improved with the H-2 blocker therapy.  Symptoms have not persisted after 4 weeks of H-2 blocker treatment.      Preventive Screening-Counseling & Management  Alcohol-Tobacco     Alcohol drinks/day: 0     Smoking Status: never  Hep-HIV-STD-Contraception     Hepatitis Risk: no risk noted     HIV Risk: no risk noted     STD Risk: no risk noted     SBE monthly: yes     SBE Education/Counseling: to perform regular SBE      Sexual History:  currently monogamous.        Drug Use:  never.        Blood Transfusions:  no.    Clinical Review Panels:  Diabetes Management   Creatinine:  0.9 (02/17/2010)   Last Flu Vaccine:  Fluvax 3+ (12/11/2009)  CBC   WBC:  7.0 (02/17/2010)   RBC:  4.40 (02/17/2010)   Hgb:  11.7 (02/17/2010)   Hct:  35.9 (02/17/2010)   Platelets:  307.0 (02/17/2010)   MCV  81.7 (02/17/2010)   MCHC  32.7 (02/17/2010)   RDW  13.2 (02/17/2010)   PMN:  57.5 (02/17/2010)   Lymphs:  34.1 (02/17/2010)   Monos:  5.2 (02/17/2010)   Eosinophils:  2.1 (02/17/2010)   Basophil:  1.1 (02/17/2010)  Complete Metabolic Panel   Glucose:  110 (02/17/2010)   Sodium:  143 (02/17/2010)   Potassium:  3.6 (02/17/2010)   Chloride:  106 (02/17/2010)   CO2:  32 (02/17/2010)   BUN:  8 (02/17/2010)   Creatinine:  0.9 (02/17/2010)   Albumin:  3.8 (12/11/2009)   Total Protein:  7.8 (12/11/2009)   Calcium:  9.1 (02/17/2010)   Total Bili:  0.8 (12/11/2009)   Alk Phos:  116 (12/11/2009)   SGPT (ALT):  13 (12/11/2009)   SGOT (AST):  17 (12/11/2009)   Medications Prior to Update: 1)  Metoprolol Succinate 25 Mg Xr24h-Tab (Metoprolol Succinate) .... Take 1 Tab Daily 2)  Aciphex 20 Mg Tbec (Rabeprazole Sodium) .... Once Daily For Heartburn 3)  Levoxyl 100 Mcg Tabs (Levothyroxine Sodium) .... One By Mouth Once Daily 4)  Losartan Potassium 50 Mg Tabs (Losartan Potassium) .... One By Mouth Once Daily For High Blood Pressure  Current Medications (verified): 1)  Metoprolol Succinate 25 Mg Xr24h-Tab (Metoprolol Succinate) .... Take 1 Tab Daily 2)  Aciphex 20 Mg Tbec (Rabeprazole Sodium) ....  Once Daily For Heartburn 3)  Levoxyl 100 Mcg Tabs (Levothyroxine Sodium) .... One By Mouth Once Daily 4)  Diovan Hct 160-12.5 Mg Tabs (Valsartan-Hydrochlorothiazide) .... One By Mouth Once Daily For High Blood Pressure  Allergies (verified): 1)  ! Vicodin 2)  ! Asa 3)  ! * Contrast Dye 4)  ! Lipitor  Past History:  Past Medical History: Reviewed history from 07/30/2009 and no changes required. HYPOTHYROIDISM (ICD-244.9) HYPERTENSION (ICD-401.9) CHOLELITHIASIS (ICD-574.20) FIBROMYALGIA (ICD-729.1) CHRONIC BACK PAIN HYPERLIPIDEMIA CONSTIPATION    Past Surgical History: Reviewed history from 10/20/2009 and no changes required. Hysterectomy Herniated disk surgery Lumbar surgery Tonsillectomy  Family History: Reviewed history from 04/25/2009 and no changes required. Family History of Diabetes: Aunt, Uncles Family History of Breast Cancer:Aunt  Social History: Reviewed history from 04/25/2009 and no changes required. 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  Alcohol Use - no Daily Caffeine Use Illicit Drug Use - no  Review of Systems  The patient denies chest pain, syncope, dyspnea on exertion, peripheral edema, prolonged cough, abdominal pain, hematuria, muscle weakness, transient blindness, difficulty walking, and angioedema.   Endo:  Denies cold intolerance, excessive hunger, excessive thirst, excessive urination, heat intolerance, polyuria, and weight change.  Physical Exam  General:  alert, well-developed, well-nourished, well-hydrated, appropriate dress, normal appearance, healthy-appearing, and cooperative to examination.   Head:  normocephalic, atraumatic, no abnormalities observed, and no abnormalities palpated.   Eyes:  vision grossly intact, pupils equal, pupils round, pupils reactive to light, pupils react to accomodation, no retinal abnormalitiies, and no nystagmus.   Ears:  R ear normal and L ear normal.   Neck:  supple, full ROM, no masses, no  thyromegaly, no thyroid nodules or tenderness, no JVD, normal carotid upstroke, no carotid bruits, no cervical lymphadenopathy, and no neck tenderness.   Lungs:  normal respiratory effort, no intercostal retractions, no accessory muscle use, normal breath sounds, no dullness, no fremitus, no crackles, and no wheezes.   Heart:  normal rate, regular rhythm, no murmur, no gallop, no rub, and no JVD.   Abdomen:  soft, non-tender, normal bowel sounds, no distention, no masses, no guarding, no rigidity, no rebound tenderness, no hepatomegaly, and no splenomegaly.   Msk:  No deformity or scoliosis noted of thoracic or lumbar spine.   Pulses:  R and L carotid,radial,femoral,dorsalis pedis and posterior tibial pulses are full and equal bilaterally Extremities:  No clubbing, cyanosis, edema, or deformity noted with normal full range of motion of all joints.   Neurologic:  No cranial nerve deficits noted. Station and gait are normal. Plantar reflexes are down-going bilaterally. DTRs are  symmetrical throughout. Sensory, motor and coordinative functions appear intact. Skin:  Intact without suspicious lesions or rashes Psych:  Cognition and judgment appear intact. Alert and cooperative with normal attention span and concentration. No apparent delusions, illusions, hallucinations   Impression & Recommendations:  Problem # 1:  HYPERTENSION (ICD-401.9) Assessment Deteriorated  The following medications were removed from the medication list:    Losartan Potassium 50 Mg Tabs (Losartan potassium) ..... One by mouth once daily for high blood pressure Her updated medication list for this problem includes:    Metoprolol Succinate 25 Mg Xr24h-tab (Metoprolol succinate) .Marland Kitchen... Take 1 tab daily    Diovan Hct 160-12.5 Mg Tabs (Valsartan-hydrochlorothiazide) ..... One by mouth once daily for high blood pressure  BP today: 184/110 Prior BP: 118/72 (02/17/2010)  Prior 10 Yr Risk Heart Disease: 8 % (02/17/2010)  Labs  Reviewed: K+: 3.6 (02/17/2010) Creat: : 0.9 (02/17/2010)   Chol: 124 (10/20/2009)   HDL: 33.80 (10/20/2009)   LDL: 64 (10/20/2009)   TG: 131.0 (10/20/2009)  Problem # 2:  HYPOTHYROIDISM (ICD-244.9) Assessment: Unchanged  Her updated medication list for this problem includes:    Levoxyl 100 Mcg Tabs (Levothyroxine sodium) ..... One by mouth once daily  Labs Reviewed: TSH: 1.42 (02/17/2010)    Chol: 124 (10/20/2009)   HDL: 33.80 (10/20/2009)   LDL: 64 (10/20/2009)   TG: 131.0 (10/20/2009)  Problem # 3:  GERD (ICD-530.81) Assessment: Improved  Her updated medication list for this problem includes:    Aciphex 20 Mg Tbec (Rabeprazole sodium) ..... Once daily for heartburn  Labs Reviewed: Hgb: 11.7 (02/17/2010)   Hct: 35.9 (02/17/2010)  Complete Medication List: 1)  Metoprolol Succinate 25 Mg Xr24h-tab (Metoprolol succinate) .... Take 1 tab daily 2)  Aciphex 20 Mg Tbec (Rabeprazole sodium) .... Once daily for heartburn 3)  Levoxyl 100 Mcg Tabs (Levothyroxine sodium) .... One by mouth once daily 4)  Diovan Hct 160-12.5 Mg Tabs (Valsartan-hydrochlorothiazide) .... One by mouth once daily for high blood pressure  Patient Instructions: 1)  Please schedule a follow-up appointment in 2 weeks. 2)  Check your Blood Pressure regularly. If it is above 140/90: you should make an appointment. 3)  Avoid foods high in acid (tomatoes, citrus juices, spicy foods). Avoid eating within two hours of lying down or before exercising. Do not over eat; try smaller more frequent meals. Elevate head of bed twelve inches when sleeping. Prescriptions: ACIPHEX 20 MG TBEC (RABEPRAZOLE SODIUM) once daily for heartburn  #60 x 0   Entered and Authorized by:   [name] MD   Signed by:   [name] MD on 04/08/2010   Method used:   Samples Given   RxID:   [id number] DIOVAN HCT 160-12.5 MG TABS (VALSARTAN-HYDROCHLOROTHIAZIDE) One by mouth once daily for high blood pressure  #112 x 0   Entered and  Authorized by:   [name] MD   Signed by:   [name] MD on 04/08/2010   Method used:   Samples Given   RxID:   [id number]

## 2010-12-29 NOTE — Assessment & Plan Note (Signed)
Summary: 4 MTH FU PER LA  [facility]C   Vital Signs:  Patient profile:   60 year old female Height:      67 inches Weight:      205.25 pounds BMI:     32.26 O2 Sat:      95 % on Room air Temp:     98.7 degrees F oral Pulse rate:   71 / minute Pulse rhythm:   regular Resp:     16 per minute BP sitting:   118 / 72  (left arm) Cuff size:   large  Vitals Entered By: Estell Harpin CMA (February 17, 2010 8:22 AM)  O2 Flow:  Room air CC: discuss Klor Con side effects and weight loss, Hypertension Management, Abdominal Pain Pain Assessment Patient in pain? no        Primary Care Provider:  [name] MD  CC:  discuss Klor Con side effects and weight loss, Hypertension Management, and Abdominal Pain.  History of Present Illness: She returns for f/up and complains that she doesn't like the taste of potassium tablets. Otherwise she feels well.  Dyspepsia History:      She has no alarm features of dyspepsia including no history of melena, hematochezia, dysphagia, persistent vomiting, or involuntary weight loss > 5%.  There is a prior history of GERD.  The patient does not have a prior history of documented ulcer disease.  The dominant symptom is heartburn or acid reflux.  An H-2 blocker medication is currently being taken.  She notes that the symptoms have improved with the H-2 blocker therapy.  Symptoms have not persisted after 4 weeks of H-2 blocker treatment.    Hypertension History:      She denies headache, chest pain, palpitations, dyspnea with exertion, orthopnea, PND, peripheral edema, visual symptoms, neurologic problems, syncope, and side effects from treatment.  She notes no problems with any antihypertensive medication side effects.        Positive major cardiovascular risk factors include female age 58 years old or older, hyperlipidemia, and hypertension.  Negative major cardiovascular risk factors include no history of diabetes, negative family history for ischemic heart disease,  and non-tobacco-user status.        Further assessment for target organ damage reveals no history of ASHD, cardiac end-organ damage (CHF/LVH), stroke/TIA, peripheral vascular disease, renal insufficiency, or hypertensive retinopathy.      Current Medications (verified): 1)  Hyzaar 50-12.5 Mg Tabs (Losartan Potassium-Hctz) .... One By Mouth Once Daily For High Blood Pressure 2)  Metoprolol Succinate 25 Mg Xr24h-Tab (Metoprolol Succinate) .... Take 1 Tab Daily 3)  Aciphex 20 Mg Tbec (Rabeprazole Sodium) .... Once Daily For Heartburn 4)  Levoxyl 100 Mcg Tabs (Levothyroxine Sodium) .... One By Mouth Once Daily 5)  K-Tabs 10 Meq Cr-Tabs (Potassium Chloride) .... One By Mouth Bid  Allergies (verified): 1)  ! Vicodin 2)  ! Asa 3)  ! * Contrast Dye 4)  ! Lipitor  Past History:  Past Medical History: Reviewed history from 07/30/2009 and no changes required. HYPOTHYROIDISM (ICD-244.9) HYPERTENSION (ICD-401.9) CHOLELITHIASIS (ICD-574.20) FIBROMYALGIA (ICD-729.1) CHRONIC BACK PAIN HYPERLIPIDEMIA CONSTIPATION    Past Surgical History: Reviewed history from 10/20/2009 and no changes required. Hysterectomy Herniated disk surgery Lumbar surgery Tonsillectomy  Family History: Reviewed history from 04/25/2009 and no changes required. Family History of Diabetes: Aunt, Uncles Family History of Breast Cancer:Aunt  Social History: Reviewed history from 04/25/2009 and no changes required. 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  Alcohol Use - no Daily  Caffeine Use Illicit Drug Use - no  Review of Systems       The patient complains of weight gain.  The patient denies anorexia, fever, weight loss, chest pain, dyspnea on exertion, peripheral edema, prolonged cough, headaches, hemoptysis, abdominal pain, melena, hematochezia, severe indigestion/heartburn, hematuria, difficulty walking, depression, and enlarged lymph nodes.    Physical Exam  General:  alert, well-developed,  well-nourished, well-hydrated, appropriate dress, normal appearance, healthy-appearing, cooperative to examination, good hygiene, and overweight-appearing.   Head:  normocephalic, atraumatic, and no abnormalities observed.   Mouth:  Oral mucosa and oropharynx without lesions or exudates.  Teeth in good repair. Neck:  supple, full ROM, no masses, no thyromegaly, no thyroid nodules or tenderness, no JVD, normal carotid upstroke, no carotid bruits, and no cervical lymphadenopathy.   Lungs:  normal respiratory effort, no intercostal retractions, no accessory muscle use, and normal breath sounds.   Heart:  normal rate, regular rhythm, no murmur, and no gallop.   Abdomen:  soft, non-tender, normal bowel sounds, no distention, no masses, no guarding, no rigidity, no hepatomegaly, and no splenomegaly.   Msk:  normal ROM, no joint tenderness, no joint swelling, no joint warmth, no redness over joints, no joint deformities, no joint instability, no crepitation, and no muscle atrophy.   Pulses:  R and L carotid,radial,femoral,dorsalis pedis and posterior tibial pulses are full and equal bilaterally Extremities:  No clubbing, cyanosis, edema, or deformity noted with normal full range of motion of all joints.   Neurologic:  No cranial nerve deficits noted. Station and gait are normal. Plantar reflexes are down-going bilaterally. DTRs are symmetrical throughout. Sensory, motor and coordinative functions appear intact. Skin:  turgor normal, color normal, no rashes, no suspicious lesions, no ecchymoses, no petechiae, no purpura, no ulcerations, and no edema.   Cervical Nodes:  no anterior cervical adenopathy and no posterior cervical adenopathy.   Psych:  Cognition and judgment appear intact. Alert and cooperative with normal attention span and concentration. No apparent delusions, illusions, hallucinations   Impression & Recommendations:  Problem # 1:  HYPOKALEMIA, MILD (ICD-276.8) Assessment  Unchanged  Orders: Venipuncture (39767) TLB-BMP (Basic Metabolic Panel-BMET) (34193-[id number]) TLB-CBC Platelet - w/Differential (85025-CBCD) TLB-TSH (Thyroid Stimulating Hormone) (84443-TSH)  Problem # 2:  HYPOTHYROIDISM (ICD-244.9) Assessment: Deteriorated  Her updated medication list for this problem includes:    Levoxyl 100 Mcg Tabs (Levothyroxine sodium) ..... One by mouth once daily  Orders: Venipuncture (73532) TLB-BMP (Basic Metabolic Panel-BMET) (99242-[id number]) TLB-CBC Platelet - w/Differential (85025-CBCD) TLB-TSH (Thyroid Stimulating Hormone) (84443-TSH)  Labs Reviewed: TSH: 8.29 (12/11/2009)    Chol: 124 (10/20/2009)   HDL: 33.80 (10/20/2009)   LDL: 64 (10/20/2009)   TG: 131.0 (10/20/2009)  Problem # 3:  HYPERTENSION (ICD-401.9) Assessment: Improved  The following medications were removed from the medication list:    Hyzaar 50-12.5 Mg Tabs (Losartan potassium-hctz) ..... One by mouth once daily for high blood pressure Her updated medication list for this problem includes:    Metoprolol Succinate 25 Mg Xr24h-tab (Metoprolol succinate) .Marland Kitchen... Take 1 tab daily    Losartan Potassium 50 Mg Tabs (Losartan potassium) ..... One by mouth once daily for high blood pressure  Orders: Venipuncture (22297) TLB-BMP (Basic Metabolic Panel-BMET) (98921-[id number]) TLB-CBC Platelet - w/Differential (85025-CBCD) TLB-TSH (Thyroid Stimulating Hormone) (84443-TSH)  BP today: 118/72 Prior BP: 148/90 (12/25/2009)  10 Yr Risk Heart Disease: 8 % Prior 10 Yr Risk Heart Disease: 17 % (12/25/2009)  Labs Reviewed: K+: 3.3 (12/11/2009) Creat: : 0.7 (12/11/2009)   Chol: 124 (10/20/2009)   HDL:  33.80 (10/20/2009)   LDL: 64 (10/20/2009)   TG: 131.0 (10/20/2009)  Complete Medication List: 1)  Metoprolol Succinate 25 Mg Xr24h-tab (Metoprolol succinate) .... Take 1 tab daily 2)  Aciphex 20 Mg Tbec (Rabeprazole sodium) .... Once daily for heartburn 3)  Levoxyl 100 Mcg Tabs (Levothyroxine sodium)  .... One by mouth once daily 4)  Losartan Potassium 50 Mg Tabs (Losartan potassium) .... One by mouth once daily for high blood pressure  Hypertension Assessment/Plan:      The patient's hypertensive risk group is category B: At least one risk factor (excluding diabetes) with no target organ damage.  Her calculated 10 year risk of coronary heart disease is 8 %.  Today's blood pressure is 118/72.  Her blood pressure goal is < 140/90.  Patient Instructions: 1)  Please schedule a follow-up appointment in 2 months. 2)  It is important that you exercise regularly at least 20 minutes 5 times a week. If you develop chest pain, have severe difficulty breathing, or feel very tired , stop exercising immediately and seek medical attention. 3)  You need to lose weight. Consider a lower calorie diet and regular exercise.  4)  Check your Blood Pressure regularly. If it is above 140/90: you should make an appointment. Prescriptions: LOSARTAN POTASSIUM 50 MG TABS (LOSARTAN POTASSIUM) One by mouth once daily for high blood pressure  #30 x 11   Entered and Authorized by:   [name] MD   Signed by:   [name] MD on 02/17/2010   Method used:   [facility] to        [facility] [address] [phone]* (retail)       [address]       [place], [place]  [address]       Ph: [phone] or [phone]       Fax: [phone]   RxID:   [id number]

## 2010-12-29 NOTE — Letter (Signed)
Summary: [facility]   Imported By: Bubba Hales 03/02/2010 07:54:23  _____________________________________________________________________  External Attachment:    Type:   Image     Comment:   External Document

## 2011-01-05 ENCOUNTER — Other Ambulatory Visit: Payer: Self-pay | Admitting: Neurosurgery

## 2011-01-05 ENCOUNTER — Ambulatory Visit
Admission: RE | Admit: 2011-01-05 | Discharge: 2011-01-05 | Disposition: A | Discharge status: Home / Self Care | Payer: 59 | Source: Ambulatory Visit | Attending: Neurosurgery | Admitting: Neurosurgery

## 2011-01-05 DIAGNOSIS — M542 Cervicalgia: Secondary | ICD-10-CM

## 2011-01-05 DIAGNOSIS — M25511 Pain in right shoulder: Secondary | ICD-10-CM

## 2011-01-05 DIAGNOSIS — M25512 Pain in left shoulder: Secondary | ICD-10-CM

## 2011-01-15 ENCOUNTER — Ambulatory Visit
Admission: RE | Admit: 2011-01-15 | Discharge: 2011-01-15 | Disposition: A | Discharge status: Home / Self Care | Payer: 59 | Source: Ambulatory Visit | Attending: Neurosurgery | Admitting: Neurosurgery

## 2011-01-15 DIAGNOSIS — M542 Cervicalgia: Secondary | ICD-10-CM

## 2011-02-15 ENCOUNTER — Encounter (HOSPITAL_COMMUNITY)
Admission: RE | Admit: 2011-02-15 | Discharge: 2011-02-15 | Disposition: A | Discharge status: Home / Self Care | Payer: 59 | Source: Ambulatory Visit | Attending: Neurosurgery | Admitting: Neurosurgery

## 2011-02-15 ENCOUNTER — Other Ambulatory Visit (HOSPITAL_COMMUNITY): Payer: Self-pay | Admitting: Neurosurgery

## 2011-02-15 DIAGNOSIS — Z01812 Encounter for preprocedural laboratory examination: Secondary | ICD-10-CM | POA: Insufficient documentation

## 2011-02-15 DIAGNOSIS — Z01811 Encounter for preprocedural respiratory examination: Secondary | ICD-10-CM

## 2011-02-15 DIAGNOSIS — Z01810 Encounter for preprocedural cardiovascular examination: Secondary | ICD-10-CM | POA: Insufficient documentation

## 2011-02-15 LAB — BASIC METABOLIC PANEL
CO2: 31 mEq/L (ref 19–32)
Chloride: 101 mEq/L (ref 96–112)
GFR calc Af Amer: >60 mL/min (ref >60)
Sodium: 138 mEq/L (ref 135–145)

## 2011-02-15 LAB — SURGICAL PCR SCREEN: MRSA, PCR: NEGATIVE

## 2011-02-15 LAB — CBC
Hemoglobin: 12.3 g/dL (ref 12.0–15.0)
MCH: 26.3 pg (ref 26.0–34.0)
RBC: 4.68 MIL/uL (ref 3.87–5.11)

## 2011-02-19 ENCOUNTER — Other Ambulatory Visit (INDEPENDENT_AMBULATORY_CARE_PROVIDER_SITE_OTHER): Payer: 59

## 2011-02-19 ENCOUNTER — Encounter: Payer: Self-pay | Admitting: Internal Medicine

## 2011-02-19 ENCOUNTER — Other Ambulatory Visit: Payer: Self-pay | Admitting: Internal Medicine

## 2011-02-19 ENCOUNTER — Ambulatory Visit (INDEPENDENT_AMBULATORY_CARE_PROVIDER_SITE_OTHER): Payer: 59 | Admitting: Internal Medicine

## 2011-02-19 ENCOUNTER — Telehealth: Payer: Self-pay | Admitting: Internal Medicine

## 2011-02-19 DIAGNOSIS — I10 Essential (primary) hypertension: Secondary | ICD-10-CM

## 2011-02-19 DIAGNOSIS — E039 Hypothyroidism, unspecified: Secondary | ICD-10-CM

## 2011-02-19 DIAGNOSIS — E876 Hypokalemia: Secondary | ICD-10-CM

## 2011-02-19 DIAGNOSIS — K219 Gastro-esophageal reflux disease without esophagitis: Secondary | ICD-10-CM

## 2011-02-19 DIAGNOSIS — IMO0001 Reserved for inherently not codable concepts without codable children: Secondary | ICD-10-CM

## 2011-02-19 DIAGNOSIS — E785 Hyperlipidemia, unspecified: Secondary | ICD-10-CM

## 2011-02-19 DIAGNOSIS — D649 Anemia, unspecified: Secondary | ICD-10-CM

## 2011-02-19 LAB — COMPREHENSIVE METABOLIC PANEL
AST: 18 U/L (ref 0–37)
Albumin: 3.5 g/dL (ref 3.5–5.2)
BUN: 12 mg/dL (ref 6–23)
CO2: 31 mEq/L (ref 19–32)
Calcium: 9.2 mg/dL (ref 8.4–10.5)
Chloride: 105 mEq/L (ref 96–112)
Creatinine, Ser: 0.8 mg/dL (ref 0.4–1.2)
GFR: 92.84 mL/min (ref >60.00)
Potassium: 3.8 mEq/L (ref 3.5–5.1)

## 2011-02-19 LAB — LIPID PANEL
HDL: 37.9 mg/dL — ABNORMAL LOW (ref >39.00)
Total CHOL/HDL Ratio: 5
VLDL: 42.4 mg/dL — ABNORMAL HIGH (ref 0.0–40.0)

## 2011-02-19 LAB — CBC WITH DIFFERENTIAL/PLATELET
Basophils Relative: 0.6 % (ref 0.0–3.0)
Eosinophils Relative: 1.7 % (ref 0.0–5.0)
Hemoglobin: 12 g/dL (ref 12.0–15.0)
Lymphocytes Relative: 35.8 % (ref 12.0–46.0)
Monocytes Relative: 6 % (ref 3.0–12.0)
Neutro Abs: 4.3 10*3/uL (ref 1.4–7.7)
Neutrophils Relative %: 55.9 % (ref 43.0–77.0)
RBC: 4.45 Mil/uL (ref 3.87–5.11)
WBC: 7.6 10*3/uL (ref 4.5–10.5)

## 2011-02-19 LAB — LDL CHOLESTEROL, DIRECT: Direct LDL: 116.6 mg/dL

## 2011-02-19 MED ORDER — RABEPRAZOLE SODIUM 20 MG PO TBEC: 20.0000 mg | DELAYED_RELEASE_TABLET | Freq: Every day | ORAL | Status: DC

## 2011-02-19 MED ORDER — DICLOFENAC SODIUM 1 % TD GEL: 1.0000 "application " | Freq: Four times a day (QID) | TRANSDERMAL | Status: DC

## 2011-02-19 MED ORDER — DICLOFENAC SODIUM 1.5 % TD SOLN: 10.0000 [drp] | Freq: Four times a day (QID) | TRANSDERMAL | Status: DC | PRN

## 2011-02-19 MED ORDER — VALSARTAN-HYDROCHLOROTHIAZIDE 160-12.5 MG PO TABS: 1.0000 | ORAL_TABLET | Freq: Every day | ORAL | Status: DC

## 2011-02-19 MED ORDER — LEVOTHYROXINE SODIUM 100 MCG PO TABS: 100.0000 ug | ORAL_TABLET | Freq: Every day | ORAL | Status: DC

## 2011-02-19 NOTE — Telephone Encounter (Signed)
Voltaren Gel 1% is among the drugs involved in the manufacturer shortages. Not available at this time. Recommendation for alternative is Pennsaid Choryl1.5% Please Advise.

## 2011-02-19 NOTE — Progress Notes (Signed)
  Subjective:    Patient ID: [name], female    DOB: 1951/07/04, 60 y.o.   MRN: [id number]  HPI She returns for f/up and she tells me that she is having C-spine surgery next week for a bone spur in her neck. She has been having some myalgias and arthralgias and wants to try Voltaren Gel. Otherwise she is doing well.   Review of Systems  Constitutional: Negative for fever, chills, activity change, appetite change, fatigue and unexpected weight change.  Respiratory: Negative for cough, shortness of breath, wheezing and stridor.   Cardiovascular: Negative for chest pain, palpitations and leg swelling.  Gastrointestinal: Negative for nausea, abdominal pain, diarrhea, constipation, blood in stool and abdominal distention.  Genitourinary: Negative for frequency, hematuria and difficulty urinating.  Skin: Negative for color change, pallor, rash and wound.  Neurological: Negative for dizziness, tremors, syncope, weakness, light-headedness and numbness.  Hematological: Negative for adenopathy.  Psychiatric/Behavioral: Negative for hallucinations, behavioral problems, confusion, dysphoric mood, decreased concentration and agitation.   Lab Results  Component Value Date   WBC 8.4 02/15/2011   HGB 12.3 02/15/2011   HCT 35.7* 02/15/2011   PLT 309 02/15/2011   CHOL 205* 10/21/2010   TRIG 210.0* 10/21/2010   HDL 36.20* 10/21/2010   LDLDIRECT 123.0 10/21/2010   ALT 14 10/21/2010   AST 19 10/21/2010   NA 138 02/15/2011   K 3.3* 02/15/2011   CL 101 02/15/2011   CREATININE 0.79 02/15/2011   BUN 9 02/15/2011   CO2 31 02/15/2011   TSH 3.17 10/21/2010   HGBA1C 6.1 10/21/2010      Objective:   Physical Exam  Constitutional: She is oriented to person, place, and time. She appears well-developed and well-nourished. No distress.  HENT:  Head: Normocephalic and atraumatic.  Right Ear: External ear normal.  Left Ear: External ear normal.  Nose: Nose normal.  Mouth/Throat: Oropharynx is clear and moist.  No oropharyngeal exudate.  Eyes: Conjunctivae and EOM are normal. Pupils are equal, round, and reactive to light. Right eye exhibits no discharge. Left eye exhibits no discharge. No scleral icterus.  Neck: Normal range of motion. Neck supple. No thyromegaly present.  Cardiovascular: Normal rate, regular rhythm, normal heart sounds and intact distal pulses.  Exam reveals no gallop and no friction rub.   No murmur heard. Pulmonary/Chest: No respiratory distress. She has no wheezes. She has no rales. She exhibits no tenderness.  Abdominal: She exhibits no distension and no mass. There is no tenderness. There is no rebound and no guarding.  Musculoskeletal: Normal range of motion. She exhibits no edema and no tenderness.  Neurological: She is alert and oriented to person, place, and time. She has normal reflexes.  Skin: Skin is warm and dry. No rash noted. She is not diaphoretic. No erythema. No pallor.  Psychiatric: She has a normal mood and affect. Her behavior is normal. Judgment and thought content normal.          Assessment & Plan:

## 2011-02-19 NOTE — Patient Instructions (Signed)

## 2011-02-24 ENCOUNTER — Observation Stay (HOSPITAL_COMMUNITY): Payer: 59

## 2011-02-24 ENCOUNTER — Observation Stay (HOSPITAL_COMMUNITY)
Admission: RE | Admit: 2011-02-24 | Discharge: 2011-02-25 | Disposition: A | Discharge status: Home / Self Care | Payer: 59 | Source: Ambulatory Visit | Attending: Neurosurgery | Admitting: Neurosurgery

## 2011-02-24 DIAGNOSIS — IMO0001 Reserved for inherently not codable concepts without codable children: Secondary | ICD-10-CM | POA: Insufficient documentation

## 2011-02-24 DIAGNOSIS — Z01810 Encounter for preprocedural cardiovascular examination: Secondary | ICD-10-CM | POA: Insufficient documentation

## 2011-02-24 DIAGNOSIS — I10 Essential (primary) hypertension: Secondary | ICD-10-CM | POA: Insufficient documentation

## 2011-02-24 DIAGNOSIS — Z01818 Encounter for other preprocedural examination: Secondary | ICD-10-CM | POA: Insufficient documentation

## 2011-02-24 DIAGNOSIS — E669 Obesity, unspecified: Secondary | ICD-10-CM | POA: Insufficient documentation

## 2011-02-24 DIAGNOSIS — M47812 Spondylosis without myelopathy or radiculopathy, cervical region: Principal | ICD-10-CM | POA: Insufficient documentation

## 2011-02-24 DIAGNOSIS — Z01812 Encounter for preprocedural laboratory examination: Secondary | ICD-10-CM | POA: Insufficient documentation

## 2011-02-24 DIAGNOSIS — K219 Gastro-esophageal reflux disease without esophagitis: Secondary | ICD-10-CM | POA: Insufficient documentation

## 2011-03-02 ENCOUNTER — Telehealth: Payer: Self-pay | Admitting: Internal Medicine

## 2011-03-02 NOTE — Telephone Encounter (Signed)
[facility] [place] & [place] 03[place]01/11 [id number] form forwarded to MD for completion 03/01/11  PA form Faxed to [employer] @ [phone] fax back stating that fax submitted was "incomplete, unable to locate patient in system by name/dob"?.Aetnaand spoke w/Jeff who stated "Well-That's weird". Upon further research, representative [Jeff] stated that "whoever built the case did so improperly and something was missing so it was denied"; upon further research while attempting to give PA information via telephone for processing, I was informed that the medication is covered for 31 tablets in a rolling 24 day period and if Pt is only taking Sig: 1 tablet po every day, then PA is NOT required.  Called pharmacy who originated PA request 02/26/2011 [facility] and left message of information relayed to me by insurance rep. That NO PA is required and to please initiate a refill on this medication. Pt informed and told to have pharmacy call [place] is any further questions and/or concerns.

## 2011-03-04 NOTE — Op Note (Signed)
[name], [name]                  ACCOUNT NO.:  [id number]  MEDICAL RECORD NO.:  [id number]           PATIENT TYPE:  O  LOCATION:  6144                         FACILITY:  [facility]  PHYSICIAN:  Kary Kos, M.D.        DATE OF BIRTH:  [birth year]  DATE OF PROCEDURE:  02/24/2011 DATE OF DISCHARGE:                              OPERATIVE REPORT   PREOPERATIVE DIAGNOSIS:  Cervical spondylosis with stenosis at C3-4  POSTOPERATIVE DIAGNOSIS:  Cervical spondylosis with stenosis at C3-4.  PROCEDURE:  Anterior cervical diskectomy and fusion at C3-4 using Globus PEEK cage packed with locally harvested autograft mixed with Actifuse and Atlantis venture plate 27 mm with 2 rescue screws placed in the previous holes at C4 and 2 new screws placed at C3, all 30-mm. Exploration, fusion, removal of hardware C4-C6, and exploration of competency of anterior support at C6-7.  SURGEON:  Kary Kos, MD  ASSISTANT:  Eustace Moore, MD  ANESTHESIA:  General endotracheal.HISTORY OF PRESENT ILLNESS:  The patient is a very pleasant 60 year old female with longstanding neck and back trouble, had previous anterior cervical diskectomy and fusion at C4-C6 many, many years ago.  The patient had progressive worsening neck pain, headaches, pain in her left shoulder, and numbness and tingling in her hands.  MRI and subsequent CT scan showed solid bony fusion at C4-C6, however, progressive breakdown with large anterior osteophyte overgrown in the plate at either end with posterior osteophytosis displacing the spinal cord at C3-4 causing severe stenosis at this level.  Due to the patient's failure to conservative treatment with MRI and CT findings, the patient is recommended exploration of fusion, removal of hardware C4-C6, and anterior cervical diskectomy and fusion at C3-C4 with the plate overriding the C3-4 as well as the C6 end of the plate, also I explained to the patient that after evaluating the C6-7 disk space  and I had to remove large part of that anterior osteophyte at C6-7 I may have to do an ACDF at C6-7.  The risks and benefits of the operation was explained to the patient.  She understood and agreed to proceed forth.  The patient was brought into the OR, was induced general anesthesia, positioned supine, neck flexed slightly and 5 pounds of Holter tracing. The right side of her neck was prepped and draped in usual sterile fashion.  The old incision was identified and marked and preoperative x- ray looked to be appropriate for reaching both the C3-4 and C6-7 disk space with removal of hardware, so after adequate prepping and draping had been achieved, the old incision was opened up and the scar tissue was dissected through the platysma and the avascular plane between the sternocleidomastoid and strap muscle was developed down to the prevertebral fascia.  Prevertebral fascia was dissected with Kittners. The plate was then immediately identified.  The Kittners so we used to dissect the prevertebral scar tissue off the plate.  The 3-4 and 6-7 disk spaces were also identified with large anterior osteophytes immediately visualized overgrowing the superior and inferior aspect of plate, so after adequate removal of the large  osteophyte at C3-4 to identify the superior osteophyte plate, taken inferiorly working inferiorly, being very careful this to drill off the very superior aspect of osteophyte enough to expose the margins of the plate.  This was achieved in all the screws, locking mechanisms were disengaged, and all the screws were removed.  The plate was then removed and the osteophyte complex overlying the C6-7 disk space was actually noted be still fairly intact and C6-7 appeared to be adequate and stable and did not need an ACDF at that level, so attention taken to C3-4.  Large anterior osteophyte was bitten off the C3-4, disk space was incised, disk space was kind of fishmouth at this  level due to the size of the anterior osteophyte coming off C3.  This osteophyte was removed to identify the C3 vertebral body.  Then working the disk space BA curettes were used to scrape the endplates, pituitary rongeurs were used to remove the anterior margins of the disk, and then using a high-speed drill with a mucus trap attached to the suction to capture the bone shavings, this disk space was drilled down the posterior osteophytic complex.  The microscope was draped and brought into field and under microscopic illumination a very large osteophyte immediately visualized on the inferior posterior aspect of C3 vertebral body.  This was all aggressively under bitten with a 2-degree Kerrison punch exposing the PLL which was removed in a piecemeal fashion exposing thecal sac. Thecal sac was then decompressed centrally marching across laterally at the C4 pedicle.  Both C4 nerve roots were explored and noted to be patent after the decompression.  The predominance of stenosis was centrally coming off predominantly the C3 vertebral body, which was aggressively under bitten until the central canal was adequately decompressed.  Endplates were then scraped again.  An 8-mm PEEK cage packed with local autograft mixed with Actifuse was incised and inserted.  A Venture plate was then placed, 2 original holes were used to capture the rescue screws at C4 and then 2 additional new holes were drilled at C3.  Locking mechanism was engaged.  Wound was copiously irrigated.  Meticulous hemostasis was maintained.  Postop fluoro showed good position of the plate, screws, and the implant and then the JP drain was placed and the wound was closed in layers with interrupted Vicryl and skin was closed with running 4-0 subcuticular.  Benzoin and Steri-Strips were applied.  The patient went to the recovery room in a stable condition.  At the end of the case, sponge and instrument count was correct.           ______________________________ Kary Kos, M.D.     GC/MEDQ  D:  02/24/2011  T:  02/25/2011  Job:  [id number]  Electronically Signed by Kary Kos M.D. on 03/04/2011 08:36:59 AM

## 2011-03-05 LAB — DIFFERENTIAL
Eosinophils Relative: 2 % (ref 0–5)
Lymphocytes Relative: 36 % (ref 12–46)
Lymphs Abs: 2.8 10*3/uL (ref 0.7–4.0)
Monocytes Absolute: 0.4 10*3/uL (ref 0.1–1.0)
Monocytes Relative: 6 % (ref 3–12)
Neutro Abs: 4.3 10*3/uL (ref 1.7–7.7)

## 2011-03-05 LAB — COMPREHENSIVE METABOLIC PANEL
AST: 18 U/L (ref 0–37)
Albumin: 3.7 g/dL (ref 3.5–5.2)
BUN: 10 mg/dL (ref 6–23)
Chloride: 102 mEq/L (ref 96–112)
Creatinine, Ser: 0.81 mg/dL (ref 0.4–1.2)
GFR calc Af Amer: >60 mL/min (ref >60)
Total Bilirubin: 0.8 mg/dL (ref 0.3–1.2)
Total Protein: 8.2 g/dL (ref 6.0–8.3)

## 2011-03-05 LAB — URINALYSIS, ROUTINE W REFLEX MICROSCOPIC
Glucose, UA: NEGATIVE mg/dL
Hgb urine dipstick: NEGATIVE
Nitrite: NEGATIVE
Specific Gravity, Urine: 1.029 (ref 1.005–1.030)
pH: 5.5 (ref 5.0–8.0)

## 2011-03-05 LAB — CBC
HCT: 38.3 % (ref 36.0–46.0)
MCV: 81.8 fL (ref 78.0–100.0)
Platelets: 320 10*3/uL (ref 150–400)
RDW: 14.2 % (ref 11.5–15.5)
WBC: 7.7 10*3/uL (ref 4.0–10.5)

## 2011-03-06 LAB — DIFFERENTIAL
Basophils Absolute: 0.1 10*3/uL (ref 0.0–0.1)
Eosinophils Absolute: 0.2 10*3/uL (ref 0.0–0.7)
Eosinophils Relative: 2 % (ref 0–5)
Eosinophils Relative: 2 % (ref 0–5)
Lymphocytes Relative: 36 % (ref 12–46)
Lymphocytes Relative: 42 % (ref 12–46)
Lymphs Abs: 3.9 10*3/uL (ref 0.7–4.0)
Monocytes Absolute: 0.6 10*3/uL (ref 0.1–1.0)
Monocytes Absolute: 0.6 10*3/uL (ref 0.1–1.0)
Monocytes Relative: 6 % (ref 3–12)

## 2011-03-06 LAB — BASIC METABOLIC PANEL
BUN: 9 mg/dL (ref 6–23)
CO2: 26 mEq/L (ref 19–32)
Chloride: 102 mEq/L (ref 96–112)
GFR calc non Af Amer: >60 mL/min (ref >60)
Glucose, Bld: 103 mg/dL — ABNORMAL HIGH (ref 70–99)
Potassium: 3.5 mEq/L (ref 3.5–5.1)
Sodium: 138 mEq/L (ref 135–145)

## 2011-03-06 LAB — CBC
HCT: 38.3 % (ref 36.0–46.0)
HCT: 40.7 % (ref 36.0–46.0)
Hemoglobin: 12.8 g/dL (ref 12.0–15.0)
Hemoglobin: 13.6 g/dL (ref 12.0–15.0)
MCHC: 33.4 g/dL (ref 30.0–36.0)
MCHC: 33.8 g/dL (ref 30.0–36.0)
MCV: 81.2 fL (ref 78.0–100.0)
MCV: 81.4 fL (ref 78.0–100.0)
MCV: 81.6 fL (ref 78.0–100.0)
Platelets: 271 10*3/uL (ref 150–400)
Platelets: 345 10*3/uL (ref 150–400)
RDW: 14.4 % (ref 11.5–15.5)
RDW: 14.8 % (ref 11.5–15.5)
WBC: 9.4 10*3/uL (ref 4.0–10.5)

## 2011-03-06 LAB — POCT CARDIAC MARKERS
CKMB, poc: <1 ng/mL — ABNORMAL LOW (ref 1.0–8.0)
Myoglobin, poc: 60.9 ng/mL (ref 12–200)

## 2011-03-06 LAB — LIPID PANEL
Cholesterol: 144 mg/dL (ref 0–200)
LDL Cholesterol: 88 mg/dL (ref 0–99)
Triglycerides: 126 mg/dL (ref <150)
VLDL: 25 mg/dL (ref 0–40)

## 2011-03-06 LAB — COMPREHENSIVE METABOLIC PANEL
AST: 15 U/L (ref 0–37)
Albumin: 3.4 g/dL — ABNORMAL LOW (ref 3.5–5.2)
Calcium: 8.7 mg/dL (ref 8.4–10.5)
Creatinine, Ser: 0.76 mg/dL (ref 0.4–1.2)
GFR calc Af Amer: >60 mL/min (ref >60)
Total Protein: 7.1 g/dL (ref 6.0–8.3)

## 2011-03-06 LAB — TSH: TSH: 5.426 u[IU]/mL — ABNORMAL HIGH (ref 0.350–4.500)

## 2011-03-06 LAB — CARDIAC PANEL(CRET KIN+CKTOT+MB+TROPI)
Relative Index: INVALID (ref 0.0–2.5)
Total CK: 62 U/L (ref 7–177)
Troponin I: 0.03 ng/mL (ref 0.00–0.06)

## 2011-03-06 LAB — HEPARIN LEVEL (UNFRACTIONATED): Heparin Unfractionated: 0.69 IU/mL (ref 0.30–0.70)

## 2011-04-06 ENCOUNTER — Other Ambulatory Visit: Payer: Self-pay | Admitting: Neurosurgery

## 2011-04-06 ENCOUNTER — Ambulatory Visit
Admission: RE | Admit: 2011-04-06 | Discharge: 2011-04-06 | Disposition: A | Discharge status: Home / Self Care | Payer: 59 | Source: Ambulatory Visit | Attending: Neurosurgery | Admitting: Neurosurgery

## 2011-04-06 ENCOUNTER — Telehealth: Payer: Self-pay | Admitting: *Deleted

## 2011-04-06 DIAGNOSIS — M25512 Pain in left shoulder: Secondary | ICD-10-CM

## 2011-04-06 DIAGNOSIS — M542 Cervicalgia: Secondary | ICD-10-CM

## 2011-04-06 NOTE — Telephone Encounter (Signed)
Patient requesting to know if MD has samples of the "salve" for her hand. RX cost over 200$. Please advise.

## 2011-04-07 NOTE — Telephone Encounter (Signed)
no

## 2011-04-08 NOTE — Telephone Encounter (Signed)
Pt aware that we have no samples. Can you suggest an alt med?

## 2011-04-08 NOTE — Telephone Encounter (Signed)
There are no generic or inexpensive alternatives to pennsaid

## 2011-04-08 NOTE — Telephone Encounter (Signed)
Left vm for pt

## 2011-04-13 NOTE — Cardiovascular Report (Signed)
NAMEDONICA, DEROUIN                  ACCOUNT NO.:  [id number]   MEDICAL RECORD NO.:  [id number]          PATIENT TYPE:  INP   LOCATION:  5409                         FACILITY:  [facility]   PHYSICIAN:  Thayer Headings, M.D. DATE OF BIRTH:  1950-12-16   DATE OF PROCEDURE:  07/23/2009  DATE OF DISCHARGE:                            CARDIAC CATHETERIZATION   [name] is a 60 year old female with a history of chest pain.  She  carries a diagnosis of mild coronary artery disease by heart  catheterization 10 years ago by Dr. Doylene Canard.  She presented last night  with some episodes of chest pain.   The procedure was left heart catheterization with coronary angiography.   The right femoral artery was easily cannulated using a modified  Seldinger technique.   HEMODYNAMICS:  The LV pressure is 170/14.  The aortic pressure is  166/81.   ANGIOGRAPHY:  Left main:  The left main is smooth and normal.   The left anterior descending artery is smooth and normal.  The LAD  reaches around the apex and supplies the inferoapical wall.   There are several diagonal arteries which are small to moderate in size  and are normal.   The left circumflex artery is a small to moderate branch.  It supplies  several obtuse marginal arteries and the whole system is normal.   The right coronary artery is moderate in size and is dominant.  It is  smooth and normal throughout its course.  The posterior descending  artery and the posterolateral segment artery are normal.   The left ventriculogram was performed in the 30 RAO position.  It  reveals normal left ventricular systolic function.  The ejection  fraction is 65-70%.  There is no mitral regurgitation.   COMPLICATIONS:  None.   CONCLUSIONS:  1. Smooth and normal coronary arteries.  2. Normal left ventricular systolic function.   She will be able to go home tonight or perhaps tomorrow.      Thayer Headings, M.D.  Electronically Signed     PJN/MEDQ   D:  07/23/2009  T:  07/24/2009  Job:  [id number]

## 2011-04-13 NOTE — Discharge Summary (Signed)
NAMECRYSTALLYNN, [name]                  ACCOUNT NO.:  [id number]   MEDICAL RECORD NO.:  [id number]          PATIENT TYPE:  INP   LOCATION:  4801                         FACILITY:  [facility]   PHYSICIAN:  Thayer Headings, M.D. DATE OF BIRTH:  1951/05/04   DATE OF ADMISSION:  07/22/2009  DATE OF DISCHARGE:  07/24/2009                               DISCHARGE SUMMARY   DISCHARGE DIAGNOSES:  1. Noncardiac chest pain.  2. Hypertension.  3. Dyslipidemia.   DISCHARGE MEDICATIONS:  1. Famotidine 20 mg a day over-the-counter.  2. Metoprolol XL 25 mg a day.  3. Hyzaar 100 mg/25 mg once a day.  4. Levoxyl 150 mcg a day.  5. Atorvastatin 20 mg a day.   The patient will follow up with Dr. Kelton Pillar as needed.  She will  see Dr. Acie Fredrickson in 1-2 weeks.   HISTORY:  [name] is a 60 year old female with history of  hypertension and dyslipidemia.  She is admitted with episodes of chest  pain.  She reported had a small blockage by heart catheterization 10  years ago.   HOSPITAL COURSE BY PROBLEMS:  Chest pain.  The patient ruled out for  myocardial infarction.  The following morning, she had a heart  catheterization.  She was found to have smooth and normal coronary  arteries.  She had normal left ventricular systolic function.  She was  kept overnight for observation and then is now in stable.   There was slight issue where the patient received metoprolol XL instead  of metoprolol tartrate.  The pharmacist and nurses were notified.  There  certainly is no harm giving her metoprolol tartrate and in fact we will  be sending her home on that.  The patient was little bit concerned, but  I reassured her that there were no ill effects.  We will see her in a  week or so for followup of her blood pressure.  She has been asked to  eat a low-salt diet.   Total time prepared in this discharge greater than 30 minutes.      Thayer Headings, M.D.  Electronically Signed     PJN/MEDQ  D:  07/24/2009   T:  07/24/2009  Job:  [id number]   cc:   Milford Cage. Laurann Montana, M.D.

## 2011-04-13 NOTE — H&P (Signed)
NAMESHAMEKA, AGGARWAL                  ACCOUNT NO.:  [id number]   MEDICAL RECORD NO.:  [id number]          PATIENT TYPE:  EMS   LOCATION:  MAJO                         FACILITY:  [facility]   PHYSICIAN:  Blima Singer, MD   DATE OF BIRTH:  [birth year]   DATE OF ADMISSION:  07/22/2009  DATE OF DISCHARGE:                              HISTORY & PHYSICAL   PRIMARY CARDIOLOGIST:  Thayer Headings, M.D.   CHIEF COMPLAINT:  Chest pain.   HISTORY OF PRESENT ILLNESS:  The patient is a 60 year old female with a  history of questionable coronary artery disease, hypertension,  hyperlipidemia, chest pain; who presents with chest pain since 1:00 p.m.  today.  The patient states that she had noted tightness tightness in her  chest radiating to the right jaw.  The patient states that she had a  stress test in January that was negative.  She had nitroglycerin in the  ER that did help relieve some of her pain, but only for 10-20 minutes.  The pain was 7 out of 10 at worse, associated shortness of breath and  diaphoresis.  She states that the pain was at rest.  She denies any PND,  orthopnea, palpitations, lower extremity edema, syncope or presyncope.  She does endorse slight mild tenderness to palpation over her sternum.   PAST MEDICAL HISTORY:  1. Hypertension.  2. Hyperlipidemia.  3. Coronary artery disease.  Per the patient, she had a small blockage      on a catheterization in May 2000.  4. Neck surgery in 2007.   MEDICATIONS:  1. Lipitor 20 mg p.o. q.h.s.  2. Hyzaar 100/25 p.o. daily.  3. Levoxyl 150 mcg p.o. daily.   SOCIAL HISTORY:  Lives in [place] with her husband.  She denies any  tobacco or alcohol.   FAMILY HISTORY:  Father died of CHF at age 21.   REVIEW OF SYSTEMS:  Positive for sweats plus diaphoresis, chest pain,  shortness of breath; otherwise a thorough 14-point review of systems was  performed and was negative except as in HPI.   PHYSICAL EXAMINATION:  Pulse 79,  respiratory rate 18, blood pressure  154/76, satting at 98%.  HEENT:  Normocephalic, atraumatic.  Pupils equal, round and reactive to  light.  Extraocular movements were intact.  Oropharynx is clear.  NECK:  Supple.  No JVD, no bruits.  CARDIOVASCULAR:  S1 and S2 are normal.  There are no murmurs or rubs  heard.  LUNGS:  Clear to auscultation bilaterally.  No crackles or  wheezes.  SKIN:  No rashes.  ABDOMEN:  Soft, nontender, nondistended.  Normal bowel sounds.  EXTREMITIES:  No clubbing, cyanosis or edema is noted.  The patient has  1+ pulses bilaterally in lower extremities.  MUSCULOSKELETAL:  No deformities.  NEUROLOGIC:  Alert and oriented x3.  Cranial nerves II-XII are intact.   EKG:  Shows normal sinus rhythm.  Of note, her axis was slightly  different than her prior EKG; most likely secondary to a lead placement  on this EKG.   LABS:  BUN 9, creatinine 0.6.  Hemoglobin 13.4.  CK-MB less than one,  troponin less than 0.05.  INR 0.9.   ASSESSMENT AND PLAN:  Unstable angina:  We will check cardiac enzymes  and continue statin ARB, and will start a beta blocker.  The patient  cannot tolerate aspirin secondary to allergy.  We will also start  heparin.  Her chest pain is somewhat confusing, as she does have some  tenderness to palpation of her sternum.  The patient states that she did  have a stress test in January, though there is no record of this here.  Further discussion will need to be performed with Dr. Acie Fredrickson to  determine whether the patient should have another stress test or a  cardiac catheterization.  Currently I am leaning towards another stress  test with functional as possible.      Blima Singer, MD  Electronically Signed     SS/MEDQ  D:  07/22/2009  T:  07/23/2009  Job:  [id number]   cc:   Thayer Headings, M.D.

## 2011-04-16 NOTE — Op Note (Signed)
NAMECHARNIECE, VENTURINO                  ACCOUNT NO.:  [id number]   MEDICAL RECORD NO.:  [id number]          PATIENT TYPE:  AMB   LOCATION:  SDS                          FACILITY:  [facility]   PHYSICIAN:  Kary Kos, M.D.        DATE OF BIRTH:  1951/10/05   DATE OF PROCEDURE:  03/21/2006  DATE OF DISCHARGE:                                 OPERATIVE REPORT   PREOPERATIVE DIAGNOSIS:  Cervical spondylitic radiculopathy C5-C6, right  greater than left.   PROCEDURE:  Anterior cervical diskectomy and fusion from cervical  spondylosis at C4-C5-C6 using 6 mm Life Net wedge at C4-5 and 5 mm Life net  wedge at C5-6 with a 40 mm Lance plate, six 32 mm screws.   SURGEON:  Kary Kos, M.D.   ASSISTANT:  Cooper Render. Pool, M.D.   ANESTHESIA:  General endotracheal.   INDICATIONS FOR PROCEDURE:  The patient is a pleasant 60 year old female who  has predominantly neck pain, with pain radiating down her right arm, into  her deltoid, as well as the forearm and thumb and first finger.  Previously  showed severe cervical spondylosis at C4-5 and C5-6 with spondylitic  compression of the spinal cord at C4-5 centrally and paramedian, as well as  severe bi-foraminal stenosis and cervical stenosis at C5-6.  The patient  failed all forms of conservative treatment and it was recommended a cervical  diskectomy and fusion.  The risks, benefits were explained to the patient  who understood and agreed to proceed.   DESCRIPTION OF PROCEDURE:  The patient was brought to the operating room.  After the administration of general anesthesia in the supine position, with  the neck in extension with 5 pounds of Holter traction.  The right side of  the neck was prepped and draped in the usual sterile fashion.  A  preoperative x-ray localized the C5 vertebral body.  A curvilinear incision  was made just from the midline to the superficial layer of the platysma and  dissected out and incised longitudinally.  The sternocleidomastoid and  strap  muscles were developed down to the prevertebral fascia.  The prevertebral  fascia was dissected with Kitners.  I immediately visualized a very large  anterior osteophyte coming off the C5 vertebral body.  This was bitten away  to expose the disk space. Then an annulotomy was made with Bovie  electrocautery and a #15 blade scalpel at C4-5 and C5-6.  Then using a 2 mm  and 3 mm Kerrison punch, the undersurface of the anterior vertebral body was  under-bitten with a pituitary and a Kerrison punch.  The large partially-  calcified disk and osteophyte were removed.  Both disk spaces were drilled  down with the Midas-Rex, down to the posterior osteophytic complex and  posterior annulus.  This was all under-bitten with a 2 mm Kerrison punch at  both levels.  At this point the operative microscope was draped and brought  onto the field, and visualized the C5-6.  Using a 1 mm Kerrison punch the  posterior longitudinal ligament was identified and removed  in a piece-meal  fashion exposing the thecal sac.  There was noted to be a very large  osteophytic complex from the C5 vertebral body compressing the endplate  and  the uncinate processand thiswas all under-bitten, decompressing the central  canal and proximal left C5 neural foramina.  Then the remainder of the C5  end plate was under-bitten so that the thecal sac would resume normal  anatomical location, and the proximal right C6 neural foramina was  identified, and this right C6 nerve root was also radically decompressed a  significant ways out its foramen.  Both neural foramina were explored with  angled nerve hooks, and no further stenosis.  Both end plates were under-  bitten, decompressing the central canal and allowing the thecal sac to  resume its anatomic location.  Gelfoam was placed in the center of the space  and then working at C4-5, the remainder of the disk space was drilled down  to the posterior annular complex. This was all  under-bitten with the 2 mm  and 3 mm Kerrison punches.  There was a very large osteophyte coming off the  C4 vertebral body, compressing the spinal cord.  This was all under-bitten  with a 1 mm and 2 mm Kerrison punch to decompress the thecal sac and  proximal C5 neural foramen.  Both C5 nerve roots were immediately identified  and skeletonized out the foramen decompressing both nerve roots and the  central canal.  After the C4 vertebral body was under-bitten and the thecal  sac resumed its normal anatomic location, the end plates were scraped and a  size 6 mm Life Net wedge was inserted at C4-5.  A 5 mm Life Net wedge was  inserted at C4-5.  A 5 mm Life Net wedge was inserted at C6-7.  Both  inserted 1 mm deep to the inter-vertebral body line.  The remainder of the  osteophyte was bitten off at the C5 vertebral body and a contour for the  plate.  A 4 mm Lance plate was placed.  This was confirmed in good position  with fluoroscopy.  Six 32 mm screws were placed.  All screws and bone grafts  were in excellent position, confirmed by postoperative fluoroscopy.  The set  screws were tightened down.  The wound was copiously irrigated and  hemostasis was maintained.  The platysma was reapproximated with interrupted  Vicryl and the skin was closed with running #4-0 subcuticular.  Benzoin and  Steri-Strips applied.   The patient went to the recovery room in stable condition.           ______________________________  Kary Kos, M.D.     GC/MEDQ  D:  03/21/2006  T:  03/22/2006  Job:  [id number]

## 2011-04-16 NOTE — Procedures (Signed)
[facility]  Patient:    [name], [name]                         MRN: [id number] Proc. Date: 04/22/00 Adm. Date:  [id number] Attending:  Nicholaus Bloom CC:         Orson Eva, M.D.             Dr. Colleen Can                           Procedure Report  PROCEDURE PERFORMED:  Occipital nerve blocks bilaterally.  ANESTHESIOLOGIST:  Nicholaus Bloom, M.D.  INTERVAL HISTORY:  The patient has noted that her pain has recurred despite her last injection.  I advised her that her pain was not staying under control, that we really needed to hold off in doing further injections.  I will give her the benefit of at least one more set of occipital nerve blocks at this time.  PHYSICAL EXAMINATION:  Blood pressure 141, 93, heart rate 92, respiratory rate 14, oxygen saturations 98%.  Pain level is 8/10 and temperature is 96.9.  She shows tenderness over the greater occipital grooves bilaterally.  Neurologic exam is otherwise grossly unchanged.  DESCRIPTION OF PROCEDURE:  After informed consent was obtained, the areas of greatest tenderness were prepped with Betadine x 3.  Using a 25 gauge needle, I injected 1.5 cc of local anesthetic at each site.  The mixture of local anesthetic consisted of 3 cc of 1% lidocaine with 5 cc of 0.5% levobupivacaine with 40 mg of Medrol.  The needle was removed intact.  POSTPROCEDURE CONDITION:  Fifteen minutes later, the patient noted marked attenuation in her discomfort.  DISPOSITION: 1. Resume previous diet. 2. Limitations in activities per instruction sheet. 3. Continue on current medications. 4. Follow up with me in one week for third injection.  The patient asked that I look at the results of her scan which we had forwarded to [place] which showed that there was no abnormality on her brain scan and I advised her of this.  I reassured her.  She continues to see Dr. Rosiland Oz with regard to her headaches.  DD:  04/22/00 TD:   04/26/00 Job: [id number]

## 2011-04-16 NOTE — H&P (Signed)
[facility]  Patient:    [name], [name]                         MRN: [id number] Adm. Date:  [id number] Attending:  Nicholaus Bloom CC:         Orson Eva, M.D.                         History and Physical  FOLLOW-UP EVALUATION:  [name] comes in originally scheduled for an occipital nerve block.  She states that her last block only lasted for about 24 hours. She has subsequently seen Dr. Rosiland Oz.  They had discussed possible surgical interventions.  In the interim, the patient has had a severe exacerbation and about two weeks ago went to the emergency room, where she required morphine to break the cycle of pain with her headache.  Apparently at that time, she developed what sounds like torticollis.  She was given Valium and Vicodin, which has helped in addition.  Dr. Rosiland Oz is out of town at the present time, and she presents today with an occipital headache which is made worse by rotation of her head.  She has had some difficulties with dizziness with her headaches.  A scan of her head shows no abnormalities of her brain.  This was performed in the remote past.  In addition to the hydrocodone and Valium, the patient continues on Hyzaar and atenolol with her Levothroid.  The patient denies any new numbness or tingling.  She notes that her pain is not brought on by chewing.  She does note that it is associated with throbbing behind her eyeballs but no lacrimation.  In the past, she responded to _____ but seems to be much less responsive at the present time.  PHYSICAL EXAMINATION:  VITAL SIGNS:  Blood pressure 151/92, heart rate 96, respiratory rate 20, O2 saturation 97%.  HEENT/NECK:  Range of motion of the neck is mildly decreased.  She has a tenderness over the occipital regions bilaterally as well as through the trapezius muscles, to a lesser extent into the rhomboids.  Carotids are 2+ and symmetrical without bruits.  NEUROLOGIC:  Deep tendon  reflexes are symmetric.  She has _____ focal weakness in the upper extremities.  Cranial nerves II-XII are grossly intact. Extraocular movements were intact with no nystagmus.  IMPRESSION:  Recurrent occipital headaches, which seem to be poorly responsive to occipital nerve blocks at this time.  DISPOSITION: 1. I will go ahead and discontinue the Valium. 2. Baclofen 10 mg one-half tablet t.i.d. x 5 days and if tolerated, one p.o.    q.d., #100 with one refill. 3. Continue on hydrocodone 5/500 one p.o. q.6h. p.r.n., #100. 4. At this time, I feel that further occipital blocks would probably not be    very fruitful.  She has not had sustained improvements on these.  Since she    is allergic to contrast dye, I feel that it would be suboptimal to perform    radiographic procedures on her neck with the hope of neurolysis.  With this    in mind, I feel that it is likely that she will need to be evaluated by Dr.    Billie Ruddy to see what he has to offer.  I will defer to Dr. Rosiland Oz with    regard to this.  My other concern is her previous history of elevated    sedimentation rate, for which  she has seen Dr. Egbert Garibaldi in the past,    whether this is related to her headaches at all.  I offered to see the patient back on an as-needed basis.  She plans to see Dr. Rosiland Oz back in early September.DD:  06/27/00 TD:  06/28/00 Job: [id number]

## 2011-04-16 NOTE — Op Note (Signed)
[name], [name]                  ACCOUNT NO.:  [id number]   MEDICAL RECORD NO.:  [id number]          PATIENT TYPE:  INP   LOCATION:  3033                         FACILITY:  [facility]   PHYSICIAN:  Kary Kos, M.D.        DATE OF BIRTH:  [birth year]   DATE OF PROCEDURE:  10/03/2006  DATE OF DISCHARGE:                                 OPERATIVE REPORT   PREOPERATIVE DIAGNOSES:  Lumbar spinal stenosis, L4-5, bilateral; L4-L5  radiculopathy; degenerative disk disease; mechanical low back pain, L4-5,  with back greater than leg pain.   POSTOPERATIVE DIAGNOSES:  Lumbar spinal stenosis, L4-5, bilateral; L4-L5  radiculopathy; degenerative disk disease; mechanical low back pain, L4-5,  with back greater than leg pain.   PROCEDURES:  Decompressive lumbar laminectomy, L4-5, in excess of what would  be needed with a standard posterior lumbar interbody fusion; posterior  lumbar interbody fusion at L4-5 using a Hybrid Telamon 12 x 26 PEEK cage and  Tangent 12 x 26-mm allograft wedge packed with DBX bone substitute mixed  with locally harvested autograft, posterolateral arthrodesis, L4-5, using  locally harvested autograft and DBX bone substitute; pedicle screw fixation,  L4-5, using the 6.35 Legacy pedicle screw system; placement of medium  Hemovac drain.   SURGEON:  Kary Kos, M.D.   ASSISTANT:  Cooper Render. Pool, M.D.   ANESTHESIA:  General endotracheal.   HISTORY OF PRESENT ILLNESS:  The patient is a very pleasant 60 year old  female, who has had longstanding back and bilateral leg pain consistent with  L4 and L5 nerve root pain.  The patient failed all forms of conservative  treatment, epidural steroid injections, anti-inflammatories, physical  therapy.  MRI imaging and MRI scan showed degenerative disk disease with a  central herniation, severe facet arthropathy and degenerative facet disease,  with bilateral L4 and L5 nerve compression and lateral recess stenosis.  Due  to the patient's  predominance of mechanical back pain over leg pain and  failure of conservative treatment, the patient was recommended a  decompression and stabilization procedure.  The procedure, risks and  benefits were explained.  The patient understands and agrees to proceed  forward.   FINDINGS:  Severe degenerative facet disease with instability at L4-5 and  hypermobility around the L4 and L5 facets.   OPERATIVE PROCEDURES:  The patient was brought to the OR and was induced  under general anesthesia and was positioned prone on the Wilson frame.  The  back was prepped and draped in the usual sterile fashion.  Preoperative x-  ray localized the L4-5 disk space.  A midline was made after infiltration of  10 cc lidocaine with epi, and Bovie electrocautery was used to take it down  through the subcutaneous tissues.  Subperiosteal dissection was carried out  onto the lamina of L4 and L5 bilaterally.  Intraoperative x-ray confirmed  localization of the appropriate level at the L4 pedicle.  So, then, the  spinous process was removed.  Complete central decompression was completed  at L4, and then complete medial facetectomies were performed.  The facets  were noted  to be markedly degenerated and diastased approximately 3 or 4 mm,  worse on the patient's left side.  L4 nerve root was noted to be markedly  stenotic and compressed under the undersurface of the L4 pedicle.  It was  unroofed and teased away.  After complete medial facetectomies were  performed, the undersurface of the interspace was also bitten out, biting  off the undersurface of the superior facet of L5.  This exposed the lateral  interspace.  The L5 nerve root was skeletonized and unroofed flush with the  pedicle.  After the decompression had been completed, there was no further  stenosis on the nerve roots of the central canal.  Attention was turned to  pedicle screw placement.  Using a high-speed drill, a pilot hole was drilled  at L4 on  the left, cannulated with the awl, probed from within the pedicle,  tapped with a 5.5 tap, probed from within the pedicle, and, again, a 6.5 x  45 screw was inserted on the right at L4.  Using bony landmarks from within  the canal and extracanalicular, as well as fluoroscopy, this screw was then  placed.  The pedicle was noted to be competent in a 360-degree orientation  from within the pedicle, as well as inspection within the canal confirmed no  medial breach.  The L5 screw was inserted on the right in similar fashion  with a 6.5 x 45 screw, and then on the left with a 6.5 x 50 on L4, and a 6.5  x 45 at L5.  After all 4 screws were inserted, attention was taken to the  interbody work.  A D'Errico was used to reflect the left L5 nerve root  medially.  Annulotomy was made with an 11 blade and the disk was noted to be  markedly degenerated.  This was cleaned out, and a size 12 distractor was  inserted.  A 12 distractor had good apposition of the endplates and was felt  to be the appropriate size for the grafts.  Then, the right-side L5 nerve  root was reflected medially.  Annulotomy was made with an 11-blade scalpel.  The disk was noted to be markedly degenerated.  Several large fragments were  removed from the central compartment using a combination of pituitary  rongeurs and downgoing Epstein curets.  Then, using a size 12 cutter and  chisel, the endplates were scraped and prepared.  The lateral disk space was  also cleaned out, decompressing the exiting nerve root at L4.  Then, after  the endplate was scraped, a Telamon PEEK cage was packed with DBX bone  substitute and mixed with locally harvested autograft and inserted on the  right side approximately 1 or 2 mm deep to the posterior vertebral body  line.  Fluoroscopy confirmed good position of the bone graft.  The distractor was removed.  Then, the left side was cleaned out in a similar  fashion.  Locally harvested autograft was packed  against the right-sided  Telamon and the left-sided Tangent allograft was inserted.  The grafts again  were confirmed to be in good position with fluoroscopy.  Then, 40 mm rods  were selected and inserted after aggressive irrigation and decortication had  been carried out in the lateral gutters and TPs.  The locally harvested  autograft and DBX bone substitute were packed in the lateral gutters.  Then,  the 40-mm rod was inserted, and the top tightening nut tightened down at L5.  The L4 pedicle screw  was compressed against L5, and this was also torqued in  place.  The neural foramina were then reinspected with a coronary dilator  and an angled dissector and noted to be widely patent, confirming no  migration of graft material.  Gelfoam was overlaid on top of the dura and  the nerve roots.  Then, a medium Hemovac drain was placed.  Postoperative  films showed good alignment of the screws, bones and rods.  Then, the wound  was closed in layers with interrupted Vicryl, and the skin was closed with a  running 4-0 subcuticular.  Benzoin and Steri-Strips were applied.  The  patient went to the recovery room in stable condition.  At the end of the  case, the needle counts were correct.           ______________________________  Kary Kos, M.D.     GC/MEDQ  D:  10/03/2006  T:  10/04/2006  Job:  [id number]   cc:   Mallie Mussel A. Pool, M.D.

## 2011-04-16 NOTE — Procedures (Signed)
[facility], [place]  Patient:    [name], CODD                         MRN: [id number] Proc. Date: 04/15/00 Adm. Date:  [id number] Attending:  Nicholaus Bloom CC:         Orson Eva, M.D.             Anson Oregon, M.D.                           Procedure Report  PROCEDURE:  Bilateral occipital nerve blocks.  DIAGNOSIS: 1. Headaches which appear to have an occipital neuralgia component. 2. Diffuse pain syndrome with elevated sedimentation rate for which she is    being seen by Dr. Marveen Reeks. 3. Hypertension. 4. Hypothyroidism. 5. Gastroesophageal reflux disease. 6. Mitral valve prolapse. 7. Tinnitus.  INTERVAL HISTORY:  The patient presents for another series. She was seen last week but did not have a driver so she is here to have another series of injections. She is currently taking Mepergan Fortes p.r.n. and Celebrex.  PHYSICAL EXAMINATION:  VITAL SIGNS:  Blood pressure is 141/91, heart rate is 88, respiratory rate 12, O2 saturations 100% and pain level is 7/10. She exhibits tenderness over the greater occipital grooves bilaterally.  NEUROLOGIC:  Unchanged from last week.  DESCRIPTION OF PROCEDURE:  After informed consent was obtained, the patient was placed in the sitting position and monitored. The occipital grooves were prepped with Betadine bilaterally and the Betadine was allowed to dry. Using a 25 gauge needle, I injected 1.5 cc of local anesthetic into each site. The solution consisted of 3 cc of 1 percent lidocaine and 5 cc of 0.5 percent levobupivacaine with 40 mg of Medrol. She got approximately 3 cc and therefore got approximately 15 mg of Medrol with the injection.  CONDITION POST PROCEDURE:  After 15 minutes, the patient noted numbness in the occipital nerve distribution and marked attenuation in her pain.  DISPOSITION: 1. Resume previous diet. 2. Limitations in activities per instruction sheet. 3. Continue on current  medications. She may benefit from a longer acting    opiate, but for the time being, she appears to be responding nicely to    p.r.n. meds with the blocks. 4. Followup with me next week for another injection. 5. I encouraged the patient to follow-up with Dr. Marveen Reeks with regard to    her elevated sedimentation rate. I did advise the patient that the    corticosteroids that I am giving her may effect the measurement of this    and it may be decreased. DD:  04/15/00 TD:  04/20/00 Job: [id number]

## 2011-04-16 NOTE — Procedures (Signed)
[facility]  Patient:    [name], [name]                         MRN: [id number] Proc. Date: 04/29/00 Adm. Date:  [id number] Attending:  Nicholaus Bloom CC:         Orson Eva, M.D.                           Procedure Report  PROCEDURE:  Occipital nerve blocks bilaterally.  DIAGNOSIS:  Occipital headaches.  INTERVAL HISTORY:  The patient continues to note overall improvement in her pain. Her pain level is down to 1/10 today.  We will go ahead and give her a final injection today in the series.  PHYSICAL EXAMINATION:  VITAL SIGNS:  Blood pressure 139/83, heart rate 60, respiratory rate 12, O2 saturations 98%, pain level 1/10, temperature 97.5.  NECK:  Good range of motion.  HEENT:  She exhibits good healing from previous injection site.  NEUROLOGIC:  Grossly unchanged from previously.  DESCRIPTION OF PROCEDURE:  The patient was placed in the sitting position and monitored.  I identified the areas of tenderness, which included the right greater occipital region, the left greater occipital and lesser occipital regions. These areas were marked.  They were prepped with Betadine x 3.  Using a 25 gauge needle, I injected 1.5 cc of local anesthetic with Medrol at each site.  The soluti9on consisted of 4 cc of 1% lidocaine with 4 cc of 0.5% ______ Medrol.  The needle as removed intact.  POST PROCEDURE CONDITION:  The patient noted marked improvement in her discomfort within ten minutes.  DISPOSITION: 1. Resume previous diet. 2. Limitations in activities per instruction sheet. 3. Continue current medications. 4. Follow up with me in two months. DD:  04/29/00 TD:  05/03/00 Job: [id number]

## 2011-04-16 NOTE — Discharge Summary (Signed)
[name], HARBACH                  ACCOUNT NO.:  [id number]   MEDICAL RECORD NO.:  [id number]          PATIENT TYPE:  INP   LOCATION:  3033                         FACILITY:  [facility]   PHYSICIAN:  Kary Kos, M.D.        DATE OF BIRTH:  12/22/1950   DATE OF ADMISSION:  10/03/2006  DATE OF DISCHARGE:  10/06/2006                               DISCHARGE SUMMARY   ADMISSION DIAGNOSIS:  Lumbar spinal stenosis, degenerative disk disease,  L4-5.   PROCEDURE PERFORMED:  Posterior lumbar interbody fusion, L4-5.   HISTORY OF PRESENT ILLNESS:  The patient is a very pleasant 60 year old  female, who is admitted as an EMA and went to the operating room, where  she underwent the afore-mentioned procedure.   HOSPITAL COURSE:  Postoperatively, the patient did very well, and went  from the recovery room to the floor.  On the floor, the patient was  doing very well on postop day 1, was afebrile with stable vital signs  with no leg pain.  Had a condition of back pain, and it was controlled  on IV analgesia.  She was progressively mobilized with physical and  occupational therapy over the next 24 to 48 hours.  Her drain was able  to be taken out on day 2.  Her antibiotics were discontinued after the  drain was removed.  She was doing very well.  Pain was well controlled  on pills.  The patient was able to be discharged with home healthy  physical therapy.  At the time of discharge, the patient was  neurologically stable, ambulating and voiding spontaneously.           ______________________________  Kary Kos, M.D.     GC/MEDQ  D:  12/09/2006  T:  12/09/2006  Job:  [id number]

## 2011-04-16 NOTE — Discharge Summary (Signed)
[name], [name]                  ACCOUNT NO.:  [id number]   MEDICAL RECORD NO.:  [id number]          PATIENT TYPE:  INP   LOCATION:  3033                         FACILITY:  [facility]   PHYSICIAN:  Kary Kos, M.D.        DATE OF BIRTH:  Nov 19, 1951   DATE OF ADMISSION:  10/03/2006  DATE OF DISCHARGE:  10/06/2006                               DISCHARGE SUMMARY   ADMITTING DIAGNOSES:  1. Degenerative disk disease.  2. Lumbar spinal stenosis.   PROCEDURE DURING THIS HOSPITALIZATION:  Decompressive lumbar laminectomy  and posterior lumbar interbody fusion at L4-5.   HOSPITAL COURSE:  The patient is a very pleasant 60 year old female who  was admitted in EMA, went to the operating room, underwent the  aforementioned procedure.  Postop the patient did very well in recovery  and on the floor.  The patient had significant improvement in her leg  pain, was ambulating and voiding, was mobilized with physical and  occupational therapy.  Her drain was able to be taken out on day 2, her  Foley was taken out on day 1.  She continued to ambulate well.  The pain  was well controlled on __________ and able to be discharged home on  hospital day 4.  At the time of discharge the patient was ambulating,  voiding spontaneously, pain well controlled on __________, and scheduled  follow up in 2 weeks with Neurosurgery.           ______________________________  Kary Kos, M.D.     GC/MEDQ  D:  11/18/2006  T:  11/18/2006  Job:  [id number]

## 2011-04-16 NOTE — Procedures (Signed)
[facility]  Patient:    [name], [name]                         MRN: [id number] Proc. Date: 04/07/00 Adm. Date:  [id number] Attending:  Nicholaus Bloom CC:         Orson Eva, M.D.                           Procedure Report  FOLLOW-UP EVALUATION  [name] comes in for follow-up evaluation and consideration for repeat series of occipital nerve blocks. Since her last evaluation, she did well up until recently having some recurrence of pain predominantly on the left side.  She has no new neurologic symptoms.  Her medications are unchanged from previously.  PHYSICAL EXAMINATION:  Blood pressure is 120/68, heart rate 71, respiratory rate 16, oxygen saturation 98%.  Pain level is 5/10.  Her neck demonstrates good range of motion.  Deep tendon reflexes were symmetric in the upper extremities. Motor was 5/5.  Sensation was intact to pinprick and vibratory sense.  IMPRESSION: 1. Bilateral occipital headaches with history of response to occipital nerve    blocks. 2. Other medical problems per primary care physician.  DISPOSITION:  The patient does not have a ride today and I advised her that we really need to bring her back and perform occipital nerve blocks when she does have a ride.  Her husband is ill and her niece just lost a baby.  We will go ahead and reschedule her back for occipital nerve blocks. DD:  04/07/00 TD:  04/11/00 Job: [id number]

## 2011-04-16 NOTE — Op Note (Signed)
   NAME:  [name], HARRIES                            ACCOUNT NO.:  [id number]   MEDICAL RECORD NO.:  [id number]                   PATIENT TYPE:  AMB   LOCATION:  ENDO                                 FACILITY:  [facility]   PHYSICIAN:  Earle Gell, M.D.                DATE OF BIRTH:  05/08/1951   DATE OF PROCEDURE:  09/04/2003  DATE OF DISCHARGE:                                 OPERATIVE REPORT   PROCEDURE:  Screening colonoscopy.   INDICATIONS:  [name] is a 60 year old female born November 15, 1951.  Ms. Yuhas is scheduled to undergo her first screening colonoscopy with  polypectomy to prevent colon cancer.   ENDOSCOPIST:  Garlan Fair, M.D.   PREMEDICATION:  1. Demerol 50 mg.  2. Versed 10 mg.   DESCRIPTION OF PROCEDURE:  After obtaining informed consent, the patient was  placed in the left lateral decubitus position.  I administered intravenous  Demerol  and intravenous Versed to achieve conscious sedation for the  procedure.  The patient's blood pressure, oxygen saturation and cardiac  rhythm were monitored throughout the procedure and documented in the medical  record.   Anal inspection was normal.  Digital rectal examination was normal.  The  Olympus adjustable pediatric colonoscope was introduced into the rectum and  advanced to the cecum.  Colonic preparation for the examination today was  excellent.   FINDINGS:  RECTUM:  Normal.  SIGMOID COLON:  Normal.  DESCENDING COLON:  Normal.  SPLENIC FLEXURE:  Normal.  TRANSVERSE COLON:  Normal.  HEPATIC FLEXURE:  Normal.  ASCENDING COLON:  Normal.  CECUM AND ILEOCECAL VALVE:  Normal.    ASSESSMENT:  Normal screening proctocolonoscopy to the cecum.  No evidence  for the presence of colorectal neoplasia.   RECOMMENDATIONS:  Repeat optimal or virtual colonoscopy in approximately 5-  10 years.                                               Earle Gell, M.D.    MJ/MEDQ  D:  09/04/2003  T:  09/04/2003  Job:   [id number]   cc:   L. Donnie Coffin, M.D.  [address]  [place] [address]  Fax: [phone]

## 2011-05-14 ENCOUNTER — Encounter: Payer: Self-pay | Admitting: Internal Medicine

## 2011-05-17 ENCOUNTER — Other Ambulatory Visit: Payer: Self-pay | Admitting: Internal Medicine

## 2011-05-17 ENCOUNTER — Other Ambulatory Visit (INDEPENDENT_AMBULATORY_CARE_PROVIDER_SITE_OTHER): Payer: 59

## 2011-05-17 ENCOUNTER — Ambulatory Visit (INDEPENDENT_AMBULATORY_CARE_PROVIDER_SITE_OTHER): Payer: 59 | Admitting: Internal Medicine

## 2011-05-17 ENCOUNTER — Encounter: Payer: Self-pay | Admitting: Internal Medicine

## 2011-05-17 DIAGNOSIS — E785 Hyperlipidemia, unspecified: Secondary | ICD-10-CM

## 2011-05-17 DIAGNOSIS — R7309 Other abnormal glucose: Secondary | ICD-10-CM

## 2011-05-17 DIAGNOSIS — K219 Gastro-esophageal reflux disease without esophagitis: Secondary | ICD-10-CM

## 2011-05-17 DIAGNOSIS — D649 Anemia, unspecified: Secondary | ICD-10-CM

## 2011-05-17 DIAGNOSIS — I10 Essential (primary) hypertension: Secondary | ICD-10-CM

## 2011-05-17 DIAGNOSIS — E876 Hypokalemia: Secondary | ICD-10-CM

## 2011-05-17 DIAGNOSIS — E039 Hypothyroidism, unspecified: Secondary | ICD-10-CM

## 2011-05-17 DIAGNOSIS — IMO0001 Reserved for inherently not codable concepts without codable children: Secondary | ICD-10-CM

## 2011-05-17 LAB — CBC WITH DIFFERENTIAL/PLATELET
Basophils Absolute: 0 10*3/uL (ref 0.0–0.1)
Eosinophils Relative: 2.1 % (ref 0.0–5.0)
HCT: 37 % (ref 36.0–46.0)
Lymphs Abs: 2.8 10*3/uL (ref 0.7–4.0)
MCV: 79.4 fl (ref 78.0–100.0)
Monocytes Absolute: 0.4 10*3/uL (ref 0.1–1.0)
Monocytes Relative: 5.7 % (ref 3.0–12.0)
Neutrophils Relative %: 55.1 % (ref 43.0–77.0)
Platelets: 326 10*3/uL (ref 150.0–400.0)
RDW: 14.3 % (ref 11.5–14.6)
WBC: 7.5 10*3/uL (ref 4.5–10.5)

## 2011-05-17 LAB — LDL CHOLESTEROL, DIRECT: Direct LDL: 137.8 mg/dL

## 2011-05-17 LAB — LIPID PANEL
HDL: 36.8 mg/dL — ABNORMAL LOW (ref >39.00)
VLDL: 44 mg/dL — ABNORMAL HIGH (ref 0.0–40.0)

## 2011-05-17 LAB — COMPREHENSIVE METABOLIC PANEL
AST: 16 U/L (ref 0–37)
Albumin: 3.9 g/dL (ref 3.5–5.2)
BUN: 14 mg/dL (ref 6–23)
CO2: 24 mEq/L (ref 19–32)
Calcium: 8.9 mg/dL (ref 8.4–10.5)
Chloride: 95 mEq/L — ABNORMAL LOW (ref 96–112)
Creatinine, Ser: 0.9 mg/dL (ref 0.4–1.2)
GFR: 78.12 mL/min (ref >60.00)
Potassium: 4.2 mEq/L (ref 3.5–5.1)

## 2011-05-17 LAB — HEMOGLOBIN A1C: Hgb A1c MFr Bld: 6.2 % (ref 4.6–6.5)

## 2011-05-17 LAB — IBC PANEL: Iron: 44 ug/dL (ref 42–145)

## 2011-05-17 NOTE — Patient Instructions (Signed)
Hypothyroidism The thyroid is a large gland located in the lower front of your neck. The thyroid gland helps control metabolism. Metabolism is how your body handles food. It controls metabolism with the hormone thyroxine. When this gland is underactive (hypothyroid), it produces too little hormone.  SYMPTOMS OF HYPOTHYROIDISM  Lethargy (feeling as though you have no energy)   Cold intolerance   Weight gain (in spite of normal food intake)   Dry skin   Coarse hair   Menstrual irregularity (if severe, may lead to infertility)   Slowing of thought processes  Cardiac problems are also caused by insufficient amounts of thyroid hormone. Hypothyroidism in the newborn is cretinism, and is an extreme form. It is important that this form be treated adequately and immediately or it will lead rapidly to retarded physical and mental development. CAUSES OF HYPOTHYROIDISM These include:   Absence or destruction of thyroid tissue.  Goiter due to iodine deficiency.   Goiter due to medications.   Congenital defects (since birth).  Problems with the pituitary. This causes a lack of TSH (thyroid stimulating hormone). This hormone tells the thyroid to turn out more hormone.   DIAGNOSIS To prove hypothyroidism, your caregiver may do blood tests and ultrasound tests. Sometimes the signs are hidden. It may be necessary for your caregiver to watch this illness with blood tests either before or after diagnosis and treatment. TREATMENT  Low levels of thyroid hormone are increased by using synthetic thyroid hormone. This is a safe, effective treatment. It usually takes about four weeks to gain the full effects of the medication. After you have the full effect of the medication, it will generally take another four weeks for problems to leave. Your caregiver may start you on low doses. If you have had heart problems the dose may be gradually increased. It is generally not an emergency to get rapidly to  normal. HOME CARE INSTRUCTIONS  Take your medications as your caregiver suggests. Let your caregiver know of any medications you are taking or start taking. Your caregiver will help you with dosage schedules.   As your condition improves, your dosage needs may increase. It will be necessary to have continuing blood tests as suggested by your caregiver.   Report all suspected medication side effects to your caregiver.  SEEK MEDICAL CARE IF YOU DEVELOP:  Sweating.  Tremulousness (tremors).   Anxiety.   Rapid weight loss.   Heat intolerance.  Emotional swings.   Diarrhea.   Weakness.   SEEK IMMEDIATE MEDICAL CARE IF: You develop chest pain, an irregular heart beat (palpitations), or a rapid heart beat. MAKE SURE YOU:   Understand these instructions.   Will watch your condition.   Will get help right away if you are not doing well or get worse.  Document Released: 11/15/2005 Document Re-Released: 10/28/2008 [facility] Patient Information 2011 [place].

## 2011-05-17 NOTE — Assessment & Plan Note (Signed)
She has had a flare of symptoms, I will check her CPK, ESR, and CRP today to see if she has developed a new myopathy

## 2011-05-17 NOTE — Assessment & Plan Note (Signed)
I will recheck her TSH today

## 2011-05-17 NOTE — Assessment & Plan Note (Signed)
She has had some flushing with sugar intake so I will check her A1C level today

## 2011-05-17 NOTE — Assessment & Plan Note (Signed)
She has no s/s of blood loss, today I will check her CBC and look at her vitamin levels

## 2011-05-17 NOTE — Progress Notes (Signed)
Subjective:    Patient ID: [name], female    DOB: Feb 02, 1951, 60 y.o.   MRN: [id number]  Thyroid Problem Presents for follow-up visit. Patient reports no anxiety, cold intolerance, constipation, depressed mood, diaphoresis, diarrhea, dry skin, fatigue, hair loss, heat intolerance, hoarse voice, leg swelling, menstrual problem, nail problem, palpitations, tremors, visual change, weight gain or weight loss. The symptoms have been improving.  She is having a flare up of FMG and has pain all over her hands, arms, and legs. She does not want a med for the symptoms. She is s/p a successful C-spine surgery.    Review of Systems  Constitutional: Negative for fever, chills, weight loss, weight gain, diaphoresis, activity change, appetite change, fatigue and unexpected weight change.  HENT: Negative.  Negative for hoarse voice.   Eyes: Negative.   Respiratory: Negative for apnea, cough, choking, chest tightness, shortness of breath, wheezing and stridor.   Cardiovascular: Negative for palpitations.  Gastrointestinal: Negative for nausea, vomiting, abdominal pain, diarrhea and constipation.  Genitourinary: Negative for dysuria, urgency, difficulty urinating, menstrual problem and dyspareunia.  Musculoskeletal: Positive for myalgias and arthralgias. Negative for back pain, joint swelling and gait problem.  Skin: Negative for color change, pallor and rash.  Neurological: Negative for dizziness, tremors, seizures, syncope, facial asymmetry, speech difficulty, weakness, light-headedness, numbness and headaches.  Hematological: Negative for cold intolerance, heat intolerance and adenopathy. Does not bruise/bleed easily.  Psychiatric/Behavioral: Negative.        Objective:   Physical Exam  Vitals reviewed. Constitutional: She is oriented to person, place, and time. She appears well-developed and well-nourished. No distress.  HENT:  Head: Normocephalic and atraumatic.  Right Ear: External ear  normal.  Left Ear: External ear normal.  Nose: Nose normal.  Mouth/Throat: Oropharynx is clear and moist. No oropharyngeal exudate.  Eyes: Conjunctivae and EOM are normal. Pupils are equal, round, and reactive to light. Right eye exhibits no discharge. Left eye exhibits no discharge. No scleral icterus.  Neck: Normal range of motion. Neck supple. No JVD present. No tracheal deviation present. No thyromegaly present.  Cardiovascular: Normal rate, regular rhythm, normal heart sounds and intact distal pulses.  Exam reveals no gallop and no friction rub.   No murmur heard. Pulmonary/Chest: Effort normal and breath sounds normal. No respiratory distress. She has no wheezes. She has no rales. She exhibits no tenderness.  Abdominal: Soft. Bowel sounds are normal. She exhibits no distension and no mass. There is no tenderness. There is no rebound and no guarding.  Musculoskeletal: Normal range of motion. She exhibits no edema and no tenderness.  Lymphadenopathy:    She has no cervical adenopathy.  Neurological: She is alert and oriented to person, place, and time. She has normal reflexes. She displays normal reflexes. No cranial nerve deficit. She exhibits normal muscle tone. Coordination normal.  Skin: Skin is warm and dry. No rash noted. She is not diaphoretic. No erythema. No pallor.  Psychiatric: She has a normal mood and affect. Her behavior is normal. Judgment and thought content normal.        Lab Results  Component Value Date   WBC 7.6 02/19/2011   HGB 12.0 02/19/2011   HCT 35.5* 02/19/2011   PLT 307.0 02/19/2011   CHOL 186 02/19/2011   TRIG 212.0* 02/19/2011   HDL 37.90* 02/19/2011   LDLDIRECT 116.6 02/19/2011   ALT 15 02/19/2011   AST 18 02/19/2011   NA 142 02/19/2011   K 3.8 02/19/2011   CL 105 02/19/2011  CREATININE 0.8 02/19/2011   BUN 12 02/19/2011   CO2 31 02/19/2011   TSH 3.69 02/19/2011   INR 1.0 07/23/2009   HGBA1C 6.1 10/21/2010    Assessment & Plan:

## 2011-05-17 NOTE — Assessment & Plan Note (Signed)
Her BP is well controlled, I will check her lytes and renal function today 

## 2011-05-17 NOTE — Assessment & Plan Note (Signed)
She is doing well on Aciphex

## 2011-05-17 NOTE — Assessment & Plan Note (Signed)
I will check her FLP today to see if her trigs have improved

## 2011-05-18 ENCOUNTER — Encounter: Payer: Self-pay | Admitting: Internal Medicine

## 2011-05-18 MED ORDER — LEVOTHYROXINE SODIUM 125 MCG PO TABS: 125.0000 ug | ORAL_TABLET | Freq: Every day | ORAL | Status: DC

## 2011-05-18 NOTE — Progress Notes (Signed)
Addended by: [name] on: 05/18/2011 07:39 AM   Modules accepted: Orders

## 2011-05-20 ENCOUNTER — Other Ambulatory Visit: Payer: Self-pay | Admitting: Neurosurgery

## 2011-05-20 ENCOUNTER — Ambulatory Visit
Admission: RE | Admit: 2011-05-20 | Discharge: 2011-05-20 | Disposition: A | Discharge status: Home / Self Care | Payer: 59 | Source: Ambulatory Visit | Attending: Neurosurgery | Admitting: Neurosurgery

## 2011-05-20 DIAGNOSIS — M542 Cervicalgia: Secondary | ICD-10-CM

## 2011-05-20 LAB — METHYLMALONIC ACID, SERUM: Methylmalonic Acid, Quantitative: 173 nmol/L (ref 87–318)

## 2011-05-21 ENCOUNTER — Ambulatory Visit: Payer: 59 | Admitting: Internal Medicine

## 2011-05-27 NOTE — Progress Notes (Signed)
Letter mailed

## 2011-05-31 ENCOUNTER — Telehealth: Payer: Self-pay

## 2011-05-31 NOTE — Telephone Encounter (Signed)
Received request from pharmacy asking if ok to switch Aciphex to a lower cost altern. Omeprazole and Pantoprazole est annual savings of $360.00. Reviewed EMR and shows 10/20/09 omeprazole not effective. Pharmacy notified via fax

## 2011-06-22 ENCOUNTER — Telehealth: Payer: Self-pay

## 2011-06-22 NOTE — Telephone Encounter (Signed)
Yes, that is correct

## 2011-06-22 NOTE — Telephone Encounter (Signed)
Patient requesting clarification on which BP med she should be taking. Her medication list shows diovan 160-121m and Metoprolol. Please advise Thanks

## 2011-06-23 NOTE — Telephone Encounter (Signed)
Called pt to advise per MD. She states that  she was given samples of diovan 320/12.5 that makes her heart race. She also c/o hand, knee and leg pain x 1 wk. I adbised pt to come in for an appt. Appt sent for 06/24/11

## 2011-06-24 ENCOUNTER — Encounter: Payer: Self-pay | Admitting: Internal Medicine

## 2011-06-24 ENCOUNTER — Ambulatory Visit (INDEPENDENT_AMBULATORY_CARE_PROVIDER_SITE_OTHER): Payer: 59 | Admitting: Internal Medicine

## 2011-06-24 ENCOUNTER — Ambulatory Visit: Payer: 59

## 2011-06-24 VITALS — BP 122/84 | HR 72 | Temp 98.5°F | Resp 16 | Wt 205.0 lb

## 2011-06-24 DIAGNOSIS — E039 Hypothyroidism, unspecified: Secondary | ICD-10-CM

## 2011-06-24 DIAGNOSIS — I10 Essential (primary) hypertension: Secondary | ICD-10-CM

## 2011-06-24 DIAGNOSIS — E781 Pure hyperglyceridemia: Secondary | ICD-10-CM

## 2011-06-24 DIAGNOSIS — IMO0001 Reserved for inherently not codable concepts without codable children: Secondary | ICD-10-CM

## 2011-06-24 DIAGNOSIS — M130 Polyarthritis, unspecified: Secondary | ICD-10-CM | POA: Insufficient documentation

## 2011-06-24 DIAGNOSIS — M129 Arthropathy, unspecified: Secondary | ICD-10-CM

## 2011-06-24 LAB — COMPREHENSIVE METABOLIC PANEL
ALT: 13 U/L (ref 0–35)
Albumin: 3.8 g/dL (ref 3.5–5.2)
Alkaline Phosphatase: 100 U/L (ref 39–117)
BUN: 12 mg/dL (ref 6–23)
CO2: 27 mEq/L (ref 19–32)
Chloride: 101 mEq/L (ref 96–112)
Creatinine, Ser: 0.7 mg/dL (ref 0.4–1.2)
Total Protein: 8.2 g/dL (ref 6.0–8.3)

## 2011-06-24 LAB — CBC WITH DIFFERENTIAL/PLATELET
Basophils Absolute: 0 10*3/uL (ref 0.0–0.1)
HCT: 36.4 % (ref 36.0–46.0)
Hemoglobin: 12.2 g/dL (ref 12.0–15.0)
Lymphs Abs: 2.3 10*3/uL (ref 0.7–4.0)
MCV: 79.5 fl (ref 78.0–100.0)
Monocytes Absolute: 0.4 10*3/uL (ref 0.1–1.0)
Monocytes Relative: 5.5 % (ref 3.0–12.0)
Neutro Abs: 4.4 10*3/uL (ref 1.4–7.7)
RDW: 14.6 % (ref 11.5–14.6)

## 2011-06-24 LAB — TSH: TSH: 2.84 u[IU]/mL (ref 0.35–5.50)

## 2011-06-24 LAB — HIGH SENSITIVITY CRP: CRP, High Sensitivity: 21.79 mg/L — ABNORMAL HIGH (ref 0.000–5.000)

## 2011-06-24 MED ORDER — BUPRENORPHINE 10 MCG/HR TD PTWK: 1.0000 | MEDICATED_PATCH | TRANSDERMAL | Status: DC

## 2011-06-24 MED ORDER — OLMESARTAN MEDOXOMIL-HCTZ 20-12.5 MG PO TABS: 1.0000 | ORAL_TABLET | Freq: Every day | ORAL | Status: DC

## 2011-06-24 MED ORDER — METHYLPREDNISOLONE ACETATE 80 MG/ML IJ SUSP
120.0000 mg | Freq: Once | INTRAMUSCULAR | Status: AC
  Administered 2011-06-24: 120 mg via INTRAMUSCULAR

## 2011-06-24 NOTE — Assessment & Plan Note (Signed)
Her BP is well controlled 

## 2011-06-24 NOTE — Assessment & Plan Note (Signed)
Her recent A1C = 6.2 so she does not need any meds yet but she will work on her lifestyle modifications

## 2011-06-24 NOTE — Patient Instructions (Signed)
Arthritis - Degenerative, Osteoarthritis You have osteoarthritis. This is the wear and tear arthritis that comes with aging. It is also called degenerative arthritis. This is common in people past middle age. It is caused by stress on the joints from living. The large weight bearing joints of the lower extremities are most often affected. The knees, hips, back, neck, and hands can become painful, swollen, and stiff. This is the most common type of arthritis. It comes on with age, carrying too much weight, and from injury. Treatment includes resting the sore joint until the pain and swelling improve. Crutches or a walker may be needed for severe flares. Only take over-the-counter or prescription medicines for pain, discomfort, or fever as directed by your caregiver. Local heat therapy may improve motion. Cortisone shots into the joint are sometimes used to reduce pain and swelling during flares. Osteoarthritis is usually not crippling and progresses slowly. There are things you can do to decrease pain:  Avoid high impact activities.   Exercise regularly.   Low impact exercises such as walking, biking and swimming help to keep the muscles strong and keep normal joint function.   Stretching helps to keep your range of motion.   Lose weight if you are overweight. This reduces joint stress.  In severe cases when you have pain at rest or increasing disability, joint surgery may be helpful. See your caregiver for follow-up treatment as recommended.  SEEK IMMEDIATE MEDICAL CARE IF:  You have severe joint pain.   Marked swelling and redness in your joint develops.   You develop a high fever.  Document Released: 11/15/2005 Document Re-Released: 03/16/2008 [facility] Patient Information 2011 [place].

## 2011-06-24 NOTE — Assessment & Plan Note (Signed)
It looks like she has an inflammatory arthropathy so I have given her an injection of depo-medrol IM to reduce the inflammation and I will start her on Butrans patch for pain relief. Also, I will check for inflammatory markers and myopathy with some labs

## 2011-06-24 NOTE — Assessment & Plan Note (Signed)
I will offer her a butrans patch for pain, she is already taking aleve with some relief

## 2011-06-24 NOTE — Progress Notes (Signed)
Subjective:    Patient ID: [name], female    DOB: Jul 25, 1951, 60 y.o.   MRN: [id number]  Hypertension This is a chronic problem. The current episode started more than 1 year ago. The problem has been gradually improving since onset. The problem is controlled. Pertinent negatives include no anxiety, blurred vision, chest pain, headaches, malaise/fatigue, neck pain, orthopnea, palpitations, peripheral edema, PND, shortness of breath or sweats. There are no associated agents to hypertension. Past treatments include beta blockers, angiotensin blockers and diuretics. The current treatment provides significant improvement. Compliance problems include medication side effects, exercise and diet.   Also, she complains of pain and swelling in her knees and elbows for several weeks. Some of her muscles ache in her lags as well.    Review of Systems  Constitutional: Positive for fatigue. Negative for fever, chills, malaise/fatigue, diaphoresis, activity change, appetite change and unexpected weight change.  HENT: Negative for sore throat, facial swelling, trouble swallowing, neck pain and voice change.   Eyes: Negative for blurred vision, photophobia, redness and visual disturbance.  Respiratory: Negative for apnea, cough, choking, chest tightness, shortness of breath, wheezing and stridor.   Cardiovascular: Negative for chest pain, palpitations, orthopnea and PND.  Gastrointestinal: Negative for nausea, vomiting, abdominal pain, diarrhea, constipation, abdominal distention and anal bleeding.  Genitourinary: Negative for dysuria, urgency, frequency, hematuria, flank pain, decreased urine volume, enuresis, difficulty urinating and dyspareunia.  Musculoskeletal: Positive for myalgias, joint swelling and arthralgias. Negative for back pain and gait problem.  Skin: Negative for color change, pallor, rash and wound.  Neurological: Negative for dizziness, tremors, seizures, syncope, facial asymmetry, speech  difficulty, weakness, light-headedness, numbness and headaches.  Hematological: Negative for adenopathy. Does not bruise/bleed easily.  Psychiatric/Behavioral: Negative for suicidal ideas, hallucinations, behavioral problems, confusion, sleep disturbance, self-injury, dysphoric mood, decreased concentration and agitation. The patient is not nervous/anxious and is not hyperactive.        Objective:   Physical Exam  Vitals reviewed. Constitutional: She is oriented to person, place, and time. She appears well-developed and well-nourished. No distress.  HENT:  Head: Normocephalic and atraumatic.  Right Ear: External ear normal.  Left Ear: External ear normal.  Nose: Nose normal.  Mouth/Throat: Oropharynx is clear and moist. No oropharyngeal exudate.  Eyes: Conjunctivae and EOM are normal. Pupils are equal, round, and reactive to light. Right eye exhibits no discharge. Left eye exhibits no discharge. No scleral icterus.  Neck: Normal range of motion. Neck supple. No JVD present. No tracheal deviation present. No thyromegaly present.  Cardiovascular: Normal rate, regular rhythm, normal heart sounds and intact distal pulses.  Exam reveals no gallop and no friction rub.   No murmur heard. Pulmonary/Chest: Effort normal and breath sounds normal. No stridor. No respiratory distress. She has no wheezes. She has no rales. She exhibits no tenderness.  Abdominal: Soft. Bowel sounds are normal. She exhibits no distension and no mass. There is no tenderness. There is no rebound and no guarding.  Musculoskeletal: Normal range of motion. She exhibits tenderness (she has mild warmth, swelling, and ttp over both knees and her left elbow). She exhibits no edema.  Lymphadenopathy:    She has no cervical adenopathy.  Neurological: She is alert and oriented to person, place, and time. She has normal reflexes. She displays normal reflexes. No cranial nerve deficit. She exhibits normal muscle tone. Coordination  normal.  Skin: Skin is warm and dry. No rash noted. She is not diaphoretic. No erythema. No pallor.  Psychiatric: She has a  normal mood and affect. Her behavior is normal. Judgment and thought content normal.        Lab Results  Component Value Date   WBC 7.5 05/17/2011   HGB 12.4 05/17/2011   HCT 37.0 05/17/2011   PLT 326.0 05/17/2011   CHOL 214* 05/17/2011   TRIG 220.0* 05/17/2011   HDL 36.80* 05/17/2011   LDLDIRECT 137.8 05/17/2011   ALT 12 05/17/2011   AST 16 05/17/2011   NA 141 05/17/2011   K 4.2 05/17/2011   CL 95* 05/17/2011   CREATININE 0.9 05/17/2011   BUN 14 05/17/2011   CO2 24 05/17/2011   TSH 9.07* 05/17/2011   INR 1.0 07/23/2009   HGBA1C 6.2 05/17/2011    Assessment & Plan:

## 2011-06-24 NOTE — Assessment & Plan Note (Signed)
I will recheck her TSH today

## 2011-06-25 ENCOUNTER — Other Ambulatory Visit: Payer: Self-pay | Admitting: Internal Medicine

## 2011-06-25 DIAGNOSIS — M179 Osteoarthritis of knee, unspecified: Secondary | ICD-10-CM | POA: Insufficient documentation

## 2011-06-25 DIAGNOSIS — M069 Rheumatoid arthritis, unspecified: Secondary | ICD-10-CM

## 2011-06-25 DIAGNOSIS — M1710 Unilateral primary osteoarthritis, unspecified knee: Secondary | ICD-10-CM | POA: Insufficient documentation

## 2011-06-30 ENCOUNTER — Telehealth: Payer: Self-pay

## 2011-06-30 NOTE — Telephone Encounter (Signed)
Per pt--bloodwork question.---pt was not specific----Ph# [phone]

## 2011-07-01 NOTE — Telephone Encounter (Signed)
Patient requesting lab results. Please advise thatnks

## 2011-07-01 NOTE — Telephone Encounter (Signed)
Her blood work was positive for rheumatoid arthritis, has she seen the rheumatologist yet?

## 2011-07-05 ENCOUNTER — Other Ambulatory Visit: Payer: Self-pay | Admitting: Internal Medicine

## 2011-07-05 DIAGNOSIS — Z1231 Encounter for screening mammogram for malignant neoplasm of breast: Secondary | ICD-10-CM

## 2011-07-06 NOTE — Telephone Encounter (Signed)
No changes for now.

## 2011-07-06 NOTE — Telephone Encounter (Signed)
Patient notified of lab results per MD. Patient was also told that [facility] will not schedule her until she call them at [phone] to clear up a restricted acct issue. Per pt she will contact their office and call me back with status to determine if referral to another provider is needed. While speaking with patient she wanted to inform MD that "her throat feels weird when taking her BP med". She describes as dry and tight, please advise if change is needed. Thanks

## 2011-07-26 ENCOUNTER — Ambulatory Visit (INDEPENDENT_AMBULATORY_CARE_PROVIDER_SITE_OTHER): Payer: 59 | Admitting: Internal Medicine

## 2011-07-26 ENCOUNTER — Encounter: Payer: Self-pay | Admitting: Internal Medicine

## 2011-07-26 VITALS — BP 128/82 | HR 83 | Temp 98.2°F | Resp 16 | Wt 199.0 lb

## 2011-07-26 DIAGNOSIS — M129 Arthropathy, unspecified: Secondary | ICD-10-CM

## 2011-07-26 DIAGNOSIS — M069 Rheumatoid arthritis, unspecified: Secondary | ICD-10-CM

## 2011-07-26 DIAGNOSIS — R7309 Other abnormal glucose: Secondary | ICD-10-CM

## 2011-07-26 DIAGNOSIS — E785 Hyperlipidemia, unspecified: Secondary | ICD-10-CM

## 2011-07-26 DIAGNOSIS — I10 Essential (primary) hypertension: Secondary | ICD-10-CM

## 2011-07-26 DIAGNOSIS — E039 Hypothyroidism, unspecified: Secondary | ICD-10-CM

## 2011-07-26 DIAGNOSIS — M130 Polyarthritis, unspecified: Secondary | ICD-10-CM

## 2011-07-26 DIAGNOSIS — K219 Gastro-esophageal reflux disease without esophagitis: Secondary | ICD-10-CM

## 2011-07-26 MED ORDER — ESOMEPRAZOLE MAGNESIUM 40 MG PO CPDR: 40.0000 mg | DELAYED_RELEASE_CAPSULE | Freq: Every day | ORAL | Status: DC

## 2011-07-26 MED ORDER — NAPROXEN SODIUM 500 & 750 MG PO TB24: 1.0000 | ORAL_TABLET | Freq: Every day | ORAL | Status: DC

## 2011-07-26 MED ORDER — LEVOTHYROXINE SODIUM 125 MCG PO TABS: 125.0000 ug | ORAL_TABLET | Freq: Every day | ORAL | Status: DC

## 2011-07-26 NOTE — Progress Notes (Signed)
Subjective:    Patient ID: [name], female    DOB: 1951/04/06, 60 y.o.   MRN: [id number]  Hypertension This is a chronic problem. The current episode started more than 1 year ago. The problem has been gradually improving since onset. The problem is controlled. Pertinent negatives include no anxiety, blurred vision, chest pain, headaches, malaise/fatigue, neck pain, orthopnea, palpitations, peripheral edema, PND, shortness of breath or sweats. There are no associated agents to hypertension. Past treatments include angiotensin blockers and diuretics. The current treatment provides significant improvement. Compliance problems include exercise and diet.   She complains of persistent joint soreness (knees) and she tells me that her rheumatologist will not see her gain b/c she has an outstanding bill of $20, also she has not started butrans because she tells me that she read the package insert and she tells me that is stated that the product can kill her if it is not used properly. She has taken some low doses of aleve and that helped some with the knee pain.    Review of Systems  Constitutional: Negative for fever, chills, malaise/fatigue, diaphoresis, activity change, appetite change, fatigue and unexpected weight change.  HENT: Negative for sore throat, facial swelling, trouble swallowing, neck pain and voice change.   Eyes: Negative for blurred vision, photophobia, pain, discharge, redness, itching and visual disturbance.  Respiratory: Negative for apnea, cough, choking, chest tightness, shortness of breath, wheezing and stridor.   Cardiovascular: Negative for chest pain, palpitations, orthopnea and PND.  Gastrointestinal: Negative for nausea, vomiting, abdominal pain, diarrhea, constipation and abdominal distention.  Genitourinary: Negative for dysuria, urgency, frequency, hematuria, flank pain, decreased urine volume, enuresis, difficulty urinating and dyspareunia.  Musculoskeletal: Positive  for arthralgias (soreness in both knees). Negative for myalgias, back pain, joint swelling and gait problem.  Skin: Negative for color change, pallor, rash and wound.  Neurological: Negative for dizziness, tremors, seizures, syncope, facial asymmetry, speech difficulty, weakness, light-headedness, numbness and headaches.  Hematological: Negative for adenopathy. Does not bruise/bleed easily.  Psychiatric/Behavioral: Negative for hallucinations, behavioral problems, confusion, dysphoric mood, decreased concentration and agitation. The patient is not nervous/anxious and is not hyperactive.        Objective:   Physical Exam  Vitals reviewed. Constitutional: She is oriented to person, place, and time. She appears well-developed and well-nourished. No distress.  HENT:  Head: Normocephalic and atraumatic.  Right Ear: External ear normal.  Left Ear: External ear normal.  Nose: Nose normal.  Mouth/Throat: Oropharynx is clear and moist. No oropharyngeal exudate.  Eyes: Conjunctivae and EOM are normal. Pupils are equal, round, and reactive to light. Right eye exhibits no discharge. Left eye exhibits no discharge. No scleral icterus.  Neck: Normal range of motion. Neck supple. No JVD present. No tracheal deviation present. No thyromegaly present.  Cardiovascular: Normal rate, regular rhythm, normal heart sounds and intact distal pulses.  Exam reveals no gallop and no friction rub.   No murmur heard. Pulmonary/Chest: Effort normal and breath sounds normal. No stridor. No respiratory distress. She has no wheezes. She has no rales. She exhibits no tenderness.  Abdominal: Soft. Bowel sounds are normal. She exhibits no distension and no mass. There is no tenderness. There is no rebound and no guarding.  Musculoskeletal: Normal range of motion. She exhibits no edema and no tenderness.  Lymphadenopathy:    She has no cervical adenopathy.  Neurological: She is alert and oriented to person, place, and time.  She has normal reflexes. She displays normal reflexes. No cranial nerve  deficit. She exhibits normal muscle tone. Coordination normal.  Skin: Skin is warm and dry. No rash noted. She is not diaphoretic. No erythema. No pallor.  Psychiatric: She has a normal mood and affect. Her behavior is normal. Judgment and thought content normal.      Lab Results  Component Value Date   WBC 7.3 06/24/2011   HGB 12.2 06/24/2011   HCT 36.4 06/24/2011   PLT 348.0 06/24/2011   CHOL 214* 05/17/2011   TRIG 220.0* 05/17/2011   HDL 36.80* 05/17/2011   LDLDIRECT 137.8 05/17/2011   ALT 13 06/24/2011   AST 19 06/24/2011   NA 138 06/24/2011   K 3.5 06/24/2011   CL 101 06/24/2011   CREATININE 0.7 06/24/2011   BUN 12 06/24/2011   CO2 27 06/24/2011   TSH 2.84 06/24/2011   INR 1.0 07/23/2009   HGBA1C 6.2 06/24/2011      Assessment & Plan:

## 2011-07-26 NOTE — Assessment & Plan Note (Signed)
She has not been taking the synthroid due to financial concerns so I have gave her samples today

## 2011-07-26 NOTE — Assessment & Plan Note (Signed)
Her BP is well controlled 

## 2011-07-26 NOTE — Patient Instructions (Signed)

## 2011-07-26 NOTE — Assessment & Plan Note (Signed)
Recent A1C shows that she has early DM II

## 2011-07-26 NOTE — Assessment & Plan Note (Signed)
Will try naprelan and encourage her to give butrans a try and to use it properly, as prescribed, I will refer her to a new rheumatologist

## 2011-07-26 NOTE — Assessment & Plan Note (Signed)
I have referred her to a new rheumatologist

## 2011-07-26 NOTE — Assessment & Plan Note (Signed)
She does not want to treat this. She has concerns about the side effects of meds.

## 2011-08-01 ENCOUNTER — Emergency Department (HOSPITAL_COMMUNITY)
Admission: EM | Admit: 2011-08-01 | Discharge: 2011-08-01 | Disposition: A | Discharge status: Home / Self Care | Payer: 59 | Attending: Emergency Medicine | Admitting: Emergency Medicine

## 2011-08-01 DIAGNOSIS — M069 Rheumatoid arthritis, unspecified: Secondary | ICD-10-CM | POA: Insufficient documentation

## 2011-08-01 DIAGNOSIS — Z79899 Other long term (current) drug therapy: Secondary | ICD-10-CM | POA: Insufficient documentation

## 2011-08-01 DIAGNOSIS — E039 Hypothyroidism, unspecified: Secondary | ICD-10-CM | POA: Insufficient documentation

## 2011-08-01 DIAGNOSIS — I10 Essential (primary) hypertension: Secondary | ICD-10-CM | POA: Insufficient documentation

## 2011-08-06 ENCOUNTER — Ambulatory Visit (HOSPITAL_COMMUNITY)
Admission: RE | Admit: 2011-08-06 | Discharge: 2011-08-06 | Disposition: A | Discharge status: Home / Self Care | Payer: 59 | Source: Ambulatory Visit | Attending: Internal Medicine | Admitting: Internal Medicine

## 2011-08-06 DIAGNOSIS — Z1231 Encounter for screening mammogram for malignant neoplasm of breast: Secondary | ICD-10-CM | POA: Insufficient documentation

## 2011-08-10 ENCOUNTER — Other Ambulatory Visit (HOSPITAL_COMMUNITY)
Admission: RE | Admit: 2011-08-10 | Discharge: 2011-08-10 | Disposition: A | Discharge status: Home / Self Care | Payer: 59 | Source: Ambulatory Visit | Attending: Obstetrics and Gynecology | Admitting: Obstetrics and Gynecology

## 2011-08-10 ENCOUNTER — Other Ambulatory Visit: Payer: Self-pay | Admitting: Obstetrics and Gynecology

## 2011-08-10 DIAGNOSIS — Z01419 Encounter for gynecological examination (general) (routine) without abnormal findings: Secondary | ICD-10-CM | POA: Insufficient documentation

## 2011-08-14 ENCOUNTER — Other Ambulatory Visit: Payer: Self-pay | Admitting: Internal Medicine

## 2011-08-16 ENCOUNTER — Encounter: Payer: Self-pay | Admitting: *Deleted

## 2011-08-17 ENCOUNTER — Ambulatory Visit (INDEPENDENT_AMBULATORY_CARE_PROVIDER_SITE_OTHER): Payer: 59 | Admitting: Gastroenterology

## 2011-08-17 ENCOUNTER — Encounter: Payer: Self-pay | Admitting: Gastroenterology

## 2011-08-17 VITALS — BP 136/80 | HR 64 | Ht 67.0 in | Wt 196.0 lb

## 2011-08-17 DIAGNOSIS — R140 Abdominal distension (gaseous): Secondary | ICD-10-CM

## 2011-08-17 DIAGNOSIS — Z860100 Personal history of colon polyps, unspecified: Secondary | ICD-10-CM | POA: Insufficient documentation

## 2011-08-17 DIAGNOSIS — R141 Gas pain: Secondary | ICD-10-CM

## 2011-08-17 DIAGNOSIS — K5909 Other constipation: Secondary | ICD-10-CM | POA: Insufficient documentation

## 2011-08-17 DIAGNOSIS — K5900 Constipation, unspecified: Secondary | ICD-10-CM

## 2011-08-17 DIAGNOSIS — R143 Flatulence: Secondary | ICD-10-CM

## 2011-08-17 NOTE — Progress Notes (Signed)
Addended by: Bernita Buffy D on: 08/17/2011 02:29 PM   Modules accepted: Orders

## 2011-08-17 NOTE — Progress Notes (Signed)
History of Present Illness:  This is a 60 year old African American female with a history of colonic polyposis and a very long and redundant colon. She had a villous adenoma removed in June of 2011. She currently has worsening constipation with gas and bloating. She feels that this problem has been exacerbated by recent prednisone prescribed for possible rheumatoid arthritis. She is under the care of Dr. [name] and Dr.Truslow in rheumatology. She has had recent knee aspirations performed. Her GI symptoms revolve around chronic constipation with gas and bloating and excessive flatus. She does have an aunt with colon carcinoma. Other problems included neck plate because of cervical spondylosis with mild dysphagia. She also has chronic GERD and is on daily Nexium. The patient denies rectal bleeding, melena, or any systemic complaints otherwise. She dt history, allergies and medications. oes use when necessary MiraLax with some success. No upper gastrointestinal or hepatobiliary symptoms. She is status post cholecystectomy. She denies any specific food intolerances, anorexia, weight loss, or history of hepatitis or pancreatitis.  Past Medical History  Diagnosis Date  . Hypertension   . Hyperthyroidism   . Cholelithiasis   . Fibromyalgia   . Chronic back pain   . Hyperlipidemia   . Constipation   . Anemia   . Personal history of colonic polyps 05/06/2010    ADENOMATOUS POLYP  . Internal hemorrhoids without mention of complication   . Esophageal reflux    Past Surgical History  Procedure Date  . Abdominal hysterectomy   . Lumbar disc surgery   . Tonsillectomy     reports that she has never smoked. She has never used smokeless tobacco. She reports that she does not drink alcohol or use illicit drugs. family history includes Breast cancer in an unspecified family member; Colon cancer in her maternal uncle; and Diabetes in an unspecified family member. Allergies  Allergen Reactions  . Aspirin    . Atorvastatin     REACTION: Myalgias  . Gadolinium      Code: HIVES, Desc: patient unsure of which kind of dye she had a reaction to until reaction to Magnevist - may be allergic to x-ray contrast, Onset Date: [id number]   . Hydrocodone-Acetaminophen   . Iohexol        ROS: Remainder review of systems 10 points noncontributory. He does have well-controlled essential hypertension, chronic hyperthyroidism, and possibly rheumatoid arthritis.      Physical Exam: General well developed well nourished patient in no acute distress, appearing her stated age Eyes PERRLA, no icterus, fundoscopic exam per opthamologist Skin no lesions noted Neck supple, no adenopathy, no thyroid enlargement, no tenderness Chest clear to percussion and auscultation Heart no significant murmurs, gallops or rubs noted Abdomen no hepatosplenomegaly masses or tenderness, BS normal.  Rectal inspection normal no fissures, or fistulae noted.  No masses or tenderness on digital exam. Stool guaiac negative. Hard impacted stool in the rectal vault which is normal color and nonbloody. Extremities no acute joint lesions, edema, phlebitis or evidence of cellulitis. Both knees swollen but nontender no increased warmth. Neurologic patient oriented x 3, cranial nerves intact, no focal neurologic deficits noted. Psychological mental status normal and normal affect.  Assessment and plan: Chronic functional constipation related to a very redundant and tortuous colon. I placed her on a high fiber diet with fiber supplements and liberal by mouth fluids, MiraLax twice a day until her normal bowel movement, then reduce MiraLax to 8 ounces each bedtime, samples of Amitiza 24 mcg twice a day with meals  with office followup in one month's time. After reviewing her colonoscopy I think she will need followup colonoscopy exam for her hepatic flexure polyp. She is to continue her other medications as listed and reviewed in her record.  No  diagnosis found.

## 2011-08-17 NOTE — Patient Instructions (Signed)
Buy Miralax OTC and mix one capful in 8 ounces of water and drink twice a day until you have normal BMs, then decrease to once a day.  Try Amitiza 33mg one tablet by mouth twice a day with meals, if it improves your constipation call back for a rx.  Buy Fiber Con OTC and take two tablets by mouth every day.  Call back to schedule an office visit in one month for a follow up with Dr PSharlett Iles

## 2011-08-19 ENCOUNTER — Ambulatory Visit
Admission: RE | Admit: 2011-08-19 | Discharge: 2011-08-19 | Disposition: A | Discharge status: Home / Self Care | Payer: 59 | Source: Ambulatory Visit | Attending: Neurosurgery | Admitting: Neurosurgery

## 2011-08-19 ENCOUNTER — Other Ambulatory Visit: Payer: Self-pay | Admitting: Neurosurgery

## 2011-08-19 DIAGNOSIS — M542 Cervicalgia: Secondary | ICD-10-CM

## 2011-09-08 ENCOUNTER — Encounter: Payer: Self-pay | Admitting: *Deleted

## 2011-09-13 ENCOUNTER — Ambulatory Visit: Payer: 59 | Admitting: Internal Medicine

## 2011-09-16 ENCOUNTER — Ambulatory Visit (INDEPENDENT_AMBULATORY_CARE_PROVIDER_SITE_OTHER): Payer: 59 | Admitting: Gastroenterology

## 2011-09-16 ENCOUNTER — Encounter: Payer: Self-pay | Admitting: Gastroenterology

## 2011-09-16 VITALS — BP 106/80 | HR 80 | Ht 67.0 in | Wt 198.6 lb

## 2011-09-16 DIAGNOSIS — K219 Gastro-esophageal reflux disease without esophagitis: Secondary | ICD-10-CM | POA: Insufficient documentation

## 2011-09-16 DIAGNOSIS — F411 Generalized anxiety disorder: Secondary | ICD-10-CM | POA: Insufficient documentation

## 2011-09-16 DIAGNOSIS — Z9089 Acquired absence of other organs: Secondary | ICD-10-CM

## 2011-09-16 DIAGNOSIS — F419 Anxiety disorder, unspecified: Secondary | ICD-10-CM

## 2011-09-16 DIAGNOSIS — Z86010 Personal history of colonic polyps: Secondary | ICD-10-CM | POA: Insufficient documentation

## 2011-09-16 DIAGNOSIS — R1011 Right upper quadrant pain: Secondary | ICD-10-CM | POA: Insufficient documentation

## 2011-09-16 DIAGNOSIS — Z9049 Acquired absence of other specified parts of digestive tract: Secondary | ICD-10-CM | POA: Insufficient documentation

## 2011-09-16 DIAGNOSIS — K5901 Slow transit constipation: Secondary | ICD-10-CM | POA: Insufficient documentation

## 2011-09-16 MED ORDER — PEG-KCL-NACL-NASULF-NA ASC-C 100 G PO SOLR: 1.0000 | Freq: Once | ORAL | Status: DC

## 2011-09-16 MED ORDER — LUBIPROSTONE 24 MCG PO CAPS: 24.0000 ug | ORAL_CAPSULE | Freq: Two times a day (BID) | ORAL | Status: AC

## 2011-09-16 MED ORDER — CLONAZEPAM 0.5 MG PO TABS: 0.5000 mg | ORAL_TABLET | Freq: Two times a day (BID) | ORAL | Status: DC | PRN

## 2011-09-16 NOTE — Patient Instructions (Signed)
You have been scheduled for a colonoscopy. Please follow written instructions given to you at your visit today. Please pick up your prep at the pharmacy within the next 2-3 days We have sent the following medications to your pharmacy for you to pick up at your convenience: Klonopin, Amitza

## 2011-09-16 NOTE — Progress Notes (Signed)
This is a 60 year old African American female with chronic acid reflux doing well on daily Nexium 40 mg. She also has chronic functional constipation with marked improvement on Amitiza 24 mcg twice a day. Her main complaint today is one of right upper quadrant discomfort gas and bloating. Previous colonoscopy in June of 2011 showed a prominent hepatic flexure polyp. Status post cholecystectomy. The patient denies a lumbar, hematochezia, upper GI or specific hepatobiliary complaints. She is under a lot of personal stress, and was very verbose describing to me all of her personal and social problems. She apparently has fibromyalgia and possibly rheumatoid arthritis.  Current Medications, Allergies, Past Medical History, Past Surgical History, Family History and Social History were reviewed in [employer] record.  Past Medical History  Diagnosis Date  . Hypertension   . Hypothyroid   . Cholelithiasis   . Fibromyalgia   . Chronic back pain   . Hyperlipidemia   . Constipation   . Anemia   . Personal history of colonic polyps 05/06/2010    ADENOMATOUS POLYP  . Internal hemorrhoids without mention of complication   . Esophageal reflux    Past Surgical History  Procedure Date  . Abdominal hysterectomy   . Lumbar disc surgery   . Tonsillectomy   . Cholecystectomy     reports that she has never smoked. She has never used smokeless tobacco. She reports that she does not drink alcohol or use illicit drugs. family history includes Breast cancer in an unspecified family member; COPD in her mother; Colon cancer in her maternal uncle; Dementia in her mother; Diabetes in her mother; Heart failure in her father; Hypertension in her mother; Kidney disease in her mother; and Lung disease in her mother. Allergies  Allergen Reactions  . Aspirin   . Atorvastatin     REACTION: Myalgias  . Gadolinium      Code: HIVES, Desc: patient unsure of which kind of dye she had a reaction to  until reaction to Magnevist - may be allergic to x-ray contrast, Onset Date: [id number]   . Hydrocodone-Acetaminophen   . Iohexol        Physical Exam: Awake and alert no acute distress. She is a very large fat apron in her mid abdominal section, but I cannot appreciate organomegaly, other masses or tenderness. Bowel sounds are normal.    Assessment and Plan: Chronic functional constipation on and when necessary MiraLax and Amitiza 24 mcg twice a day. I scheduled followup colonoscopy with propofol sedation. Also I've given her Klonopin 0.5 mg to use every 12 hours when necessary was short-term basis for her anxiety. This issue will need followup with primary care. I reviewed a reflux regime with her and will continue Nexium 40 mg a day. Encounter Diagnosis  Name Primary?  . History of colon polyps Yes

## 2011-09-27 ENCOUNTER — Ambulatory Visit (AMBULATORY_SURGERY_CENTER): Payer: 59 | Admitting: Gastroenterology

## 2011-09-27 ENCOUNTER — Encounter: Payer: Self-pay | Admitting: Gastroenterology

## 2011-09-27 DIAGNOSIS — K5900 Constipation, unspecified: Secondary | ICD-10-CM

## 2011-09-27 DIAGNOSIS — Z86010 Personal history of colonic polyps: Secondary | ICD-10-CM

## 2011-09-27 DIAGNOSIS — K5909 Other constipation: Secondary | ICD-10-CM

## 2011-09-27 HISTORY — PX: COLONOSCOPY: SHX174

## 2011-09-27 MED ORDER — SODIUM CHLORIDE 0.9 % IV SOLN: 500.0000 mL | INTRAVENOUS | Status: DC

## 2011-09-27 NOTE — Patient Instructions (Signed)
Please refer to your blue and neon green sheets for instructions regarding diet and activity for the rest of today.  You may resume your medications as you would normally take them.   Hemorrhoids Hemorrhoids are enlarged (dilated) veins around the rectum. There are 2 types of hemorrhoids, and the type of hemorrhoid is determined by its location. Internal hemorrhoids occur in the veins just inside the rectum.They are usually not painful, but they may bleed.However, they may poke through to the outside and become irritated and painful. External hemorrhoids involve the veins outside the anus and can be felt as a painful swelling or hard lump near the anus.They are often itchy and may crack and bleed. Sometimes clots will form in the veins. This makes them swollen and painful. These are called thrombosed hemorrhoids. CAUSES Causes of hemorrhoids include:  Pregnancy. This increases the pressure in the hemorrhoidal veins.   Constipation.   Straining to have a bowel movement.   Obesity.   Heavy lifting or other activity that caused you to strain.  TREATMENT Most of the time hemorrhoids improve in 1 to 2 weeks. However, if symptoms do not seem to be getting better or if you have a lot of rectal bleeding, your caregiver may perform a procedure to help make the hemorrhoids get smaller or remove them completely.Possible treatments include:  Rubber band ligation. A rubber band is placed at the base of the hemorrhoid to cut off the circulation.   Sclerotherapy. A chemical is injected to shrink the hemorrhoid.   Infrared light therapy. Tools are used to burn the hemorrhoid.   Hemorrhoidectomy. This is surgical removal of the hemorrhoid.  HOME CARE INSTRUCTIONS   Increase fiber in your diet. Ask your caregiver about using fiber supplements.   Drink enough water and fluids to keep your urine clear or pale yellow.   Exercise regularly.   Go to the bathroom when you have the urge to have a  bowel movement. Do not wait.   Avoid straining to have bowel movements.   Keep the anal area dry and clean.   Only take over-the-counter or prescription medicines for pain, discomfort, or fever as directed by your caregiver.  If your hemorrhoids are thrombosed:  Take warm sitz baths for 20 to 30 minutes, 3 to 4 times per day.   If the hemorrhoids are very tender and swollen, place ice packs on the area as tolerated. Using ice packs between sitz baths may be helpful. Fill a plastic bag with ice. Place a towel between the bag of ice and your skin.   Medicated creams and suppositories may be used or applied as directed.   Do not use a donut-shaped pillow or sit on the toilet for long periods. This increases blood pooling and pain.  SEEK MEDICAL CARE IF:   You have increasing pain and swelling that is not controlled with your medicine.   You have uncontrolled bleeding.   You have difficulty or you are unable to have a bowel movement.   You have pain or inflammation outside the area of the hemorrhoids.   You have chills or an oral temperature above 102 F (38.9 C).  MAKE SURE YOU:   Understand these instructions.   Will watch your condition.   Will get help right away if you are not doing well or get worse.  Document Released: 11/12/2000 Document Revised: 07/28/2011 Document Reviewed: 03/19/2008 [facility] Patient Information 2012 [place].

## 2011-09-28 ENCOUNTER — Telehealth: Payer: Self-pay | Admitting: *Deleted

## 2011-09-28 NOTE — Telephone Encounter (Signed)

## 2011-11-15 ENCOUNTER — Other Ambulatory Visit: Payer: Self-pay | Admitting: Internal Medicine

## 2011-11-15 ENCOUNTER — Other Ambulatory Visit (INDEPENDENT_AMBULATORY_CARE_PROVIDER_SITE_OTHER): Payer: 59

## 2011-11-15 ENCOUNTER — Ambulatory Visit (INDEPENDENT_AMBULATORY_CARE_PROVIDER_SITE_OTHER): Payer: 59 | Admitting: Internal Medicine

## 2011-11-15 ENCOUNTER — Encounter: Payer: Self-pay | Admitting: Internal Medicine

## 2011-11-15 DIAGNOSIS — I10 Essential (primary) hypertension: Secondary | ICD-10-CM

## 2011-11-15 DIAGNOSIS — Z23 Encounter for immunization: Secondary | ICD-10-CM

## 2011-11-15 DIAGNOSIS — R202 Paresthesia of skin: Secondary | ICD-10-CM | POA: Insufficient documentation

## 2011-11-15 DIAGNOSIS — R7309 Other abnormal glucose: Secondary | ICD-10-CM

## 2011-11-15 DIAGNOSIS — E785 Hyperlipidemia, unspecified: Secondary | ICD-10-CM

## 2011-11-15 DIAGNOSIS — E039 Hypothyroidism, unspecified: Secondary | ICD-10-CM

## 2011-11-15 DIAGNOSIS — M069 Rheumatoid arthritis, unspecified: Secondary | ICD-10-CM

## 2011-11-15 DIAGNOSIS — R209 Unspecified disturbances of skin sensation: Secondary | ICD-10-CM

## 2011-11-15 DIAGNOSIS — E781 Pure hyperglyceridemia: Secondary | ICD-10-CM

## 2011-11-15 LAB — COMPREHENSIVE METABOLIC PANEL
ALT: 19 U/L (ref 0–35)
Albumin: 3.6 g/dL (ref 3.5–5.2)
BUN: 16 mg/dL (ref 6–23)
CO2: 28 mEq/L (ref 19–32)
Calcium: 9.4 mg/dL (ref 8.4–10.5)
Chloride: 98 mEq/L (ref 96–112)
Creatinine, Ser: 1 mg/dL (ref 0.4–1.2)
GFR: 72.62 mL/min (ref >60.00)

## 2011-11-15 LAB — CBC WITH DIFFERENTIAL/PLATELET
Eosinophils Relative: 0.4 % (ref 0.0–5.0)
HCT: 39.7 % (ref 36.0–46.0)
Hemoglobin: 13.7 g/dL (ref 12.0–15.0)
Lymphs Abs: 5 10*3/uL — ABNORMAL HIGH (ref 0.7–4.0)
Monocytes Relative: 4.7 % (ref 3.0–12.0)
Neutro Abs: 10.8 10*3/uL — ABNORMAL HIGH (ref 1.4–7.7)
RBC: 4.78 Mil/uL (ref 3.87–5.11)
WBC: 16.7 10*3/uL — ABNORMAL HIGH (ref 4.5–10.5)

## 2011-11-15 LAB — VITAMIN B12: Vitamin B-12: 360 pg/mL (ref 211–911)

## 2011-11-15 LAB — FOLATE: Folate: 14.8 ng/mL (ref >5.9)

## 2011-11-15 LAB — LIPID PANEL: Total CHOL/HDL Ratio: 6

## 2011-11-15 MED ORDER — CYANOCOBALAMIN 100 MCG PO TABS: 100.0000 ug | ORAL_TABLET | Freq: Every day | ORAL | Status: DC

## 2011-11-15 MED ORDER — ROSUVASTATIN CALCIUM 5 MG PO TABS: 5.0000 mg | ORAL_TABLET | Freq: Every day | ORAL | Status: DC

## 2011-11-15 NOTE — Assessment & Plan Note (Signed)
Start crestor

## 2011-11-15 NOTE — Progress Notes (Signed)
Subjective:    Patient ID: [name], female    DOB: January 15, 1951, 60 y.o.   MRN: [id number]  Thyroid Problem Presents for follow-up visit. Patient reports no anxiety, cold intolerance, constipation, depressed mood, diaphoresis, diarrhea, dry skin, fatigue, hair loss, heat intolerance, hoarse voice, leg swelling, menstrual problem, nail problem, palpitations, tremors, visual change, weight gain or weight loss. The symptoms have been stable.  Hypertension This is a chronic problem. The current episode started more than 1 year ago. The problem has been gradually improving since onset. The problem is controlled. Pertinent negatives include no anxiety, blurred vision, chest pain, headaches, malaise/fatigue, neck pain, orthopnea, palpitations, peripheral edema, PND, shortness of breath or sweats. There are no associated agents to hypertension. Past treatments include angiotensin blockers and diuretics. The current treatment provides significant improvement. Compliance problems include exercise and diet.  Hypertensive end-organ damage includes a thyroid problem.      Review of Systems  Constitutional: Negative for fever, chills, weight loss, weight gain, malaise/fatigue, diaphoresis, activity change, appetite change, fatigue and unexpected weight change.  HENT: Negative for sore throat, hoarse voice, trouble swallowing, neck pain and voice change.   Eyes: Negative.  Negative for blurred vision.  Respiratory: Negative for apnea, cough, choking, chest tightness, shortness of breath, wheezing and stridor.   Cardiovascular: Negative for chest pain, palpitations, orthopnea and PND.  Gastrointestinal: Negative for nausea, vomiting, abdominal pain, diarrhea, constipation, abdominal distention and anal bleeding.  Genitourinary: Negative for dysuria, urgency, frequency, decreased urine volume, enuresis, difficulty urinating, menstrual problem and dyspareunia.  Musculoskeletal: Positive for arthralgias.  Negative for myalgias, back pain, joint swelling and gait problem.  Skin: Negative for color change, pallor, rash and wound.  Neurological: Positive for numbness (on both feet). Negative for dizziness, tremors, seizures, syncope, facial asymmetry, speech difficulty, weakness, light-headedness and headaches.  Hematological: Negative for cold intolerance, heat intolerance and adenopathy. Does not bruise/bleed easily.  Psychiatric/Behavioral: Negative.        Objective:   Physical Exam  Vitals reviewed. Constitutional: She is oriented to person, place, and time. She appears well-developed and well-nourished. No distress.  HENT:  Head: Normocephalic and atraumatic.  Mouth/Throat: Oropharynx is clear and moist. No oropharyngeal exudate.  Eyes: Conjunctivae are normal. Right eye exhibits no discharge. Left eye exhibits no discharge. No scleral icterus.  Neck: Normal range of motion. Neck supple. No JVD present. No tracheal deviation present. No thyromegaly present.  Cardiovascular: Normal rate, regular rhythm, normal heart sounds and intact distal pulses.  Exam reveals no gallop and no friction rub.   No murmur heard. Pulmonary/Chest: Effort normal and breath sounds normal. No stridor. No respiratory distress. She has no wheezes. She has no rales. She exhibits no tenderness.  Abdominal: Soft. Bowel sounds are normal. She exhibits no distension and no mass. There is no tenderness. There is no rebound and no guarding.  Musculoskeletal: Normal range of motion. She exhibits no edema and no tenderness.  Lymphadenopathy:    She has no cervical adenopathy.  Neurological: She is oriented to person, place, and time.  Skin: Skin is warm and dry. No rash noted. She is not diaphoretic. No erythema. No pallor.  Psychiatric: She has a normal mood and affect. Her behavior is normal. Judgment and thought content normal.      Lab Results  Component Value Date   WBC 7.3 06/24/2011   HGB 12.2 06/24/2011    HCT 36.4 06/24/2011   PLT 348.0 06/24/2011   GLUCOSE 98 06/24/2011   CHOL 214* 05/17/2011  TRIG 220.0* 05/17/2011   HDL 36.80* 05/17/2011   LDLDIRECT 137.8 05/17/2011   LDLCALC 64 10/20/2009   ALT 13 06/24/2011   AST 19 06/24/2011   NA 138 06/24/2011   K 3.5 06/24/2011   CL 101 06/24/2011   CREATININE 0.7 06/24/2011   BUN 12 06/24/2011   CO2 27 06/24/2011   TSH 2.84 06/24/2011   INR 1.0 07/23/2009   HGBA1C 6.2 06/24/2011      Assessment & Plan:

## 2011-11-15 NOTE — Assessment & Plan Note (Signed)
Her BP is well controlled, I will check her lytes today

## 2011-11-15 NOTE — Patient Instructions (Signed)

## 2011-11-15 NOTE — Assessment & Plan Note (Signed)
She has a sensory paresthesia in both feet so I have asked her to get a NCS/EMG done and I will check her labs to look for secondary causes

## 2011-11-15 NOTE — Assessment & Plan Note (Signed)
Check FLP today. 

## 2011-11-15 NOTE — Assessment & Plan Note (Signed)
I will check her a1c today 

## 2011-11-15 NOTE — Assessment & Plan Note (Signed)
Check TSH today

## 2011-11-15 NOTE — Assessment & Plan Note (Signed)
She is doing much better on MTX and prednisone

## 2011-11-16 ENCOUNTER — Encounter: Payer: Self-pay | Admitting: Internal Medicine

## 2011-11-17 LAB — PROTEIN ELECTROPHORESIS, SERUM
Beta Globulin: 5.9 % (ref 4.7–7.2)
Gamma Globulin: 12.6 % (ref 11.1–18.8)
Total Protein, Serum Electrophoresis: 7.1 g/dL (ref 6.0–8.3)

## 2011-11-26 ENCOUNTER — Ambulatory Visit: Payer: 59 | Admitting: Internal Medicine

## 2011-12-10 ENCOUNTER — Telehealth: Payer: Self-pay | Admitting: *Deleted

## 2011-12-10 ENCOUNTER — Encounter: Payer: Self-pay | Admitting: Internal Medicine

## 2011-12-10 ENCOUNTER — Ambulatory Visit (INDEPENDENT_AMBULATORY_CARE_PROVIDER_SITE_OTHER): Payer: 59 | Admitting: Internal Medicine

## 2011-12-10 VITALS — BP 110/82 | HR 82 | Temp 97.9°F | Wt 198.1 lb

## 2011-12-10 DIAGNOSIS — B349 Viral infection, unspecified: Secondary | ICD-10-CM

## 2011-12-10 DIAGNOSIS — R300 Dysuria: Secondary | ICD-10-CM

## 2011-12-10 DIAGNOSIS — J069 Acute upper respiratory infection, unspecified: Secondary | ICD-10-CM

## 2011-12-10 DIAGNOSIS — B9789 Other viral agents as the cause of diseases classified elsewhere: Secondary | ICD-10-CM

## 2011-12-10 MED ORDER — BENZONATATE 200 MG PO CAPS: 200.0000 mg | ORAL_CAPSULE | Freq: Two times a day (BID) | ORAL | Status: AC | PRN

## 2011-12-10 MED ORDER — HYDROCODONE-HOMATROPINE 5-1.5 MG/5ML PO SYRP: 5.0000 mL | ORAL_SOLUTION | Freq: Three times a day (TID) | ORAL | Status: DC | PRN

## 2011-12-10 MED ORDER — OSELTAMIVIR PHOSPHATE 75 MG PO CAPS: 75.0000 mg | ORAL_CAPSULE | Freq: Two times a day (BID) | ORAL | Status: AC

## 2011-12-10 NOTE — Patient Instructions (Signed)
It was good to see you today. Tamiflu and Hydromet cough syrup - Your prescription(s) have been submitted to your pharmacy. Please take as directed and contact our office if you believe you are having problem(s) with the medication(s). Alternate between ibuprofen and tylenol for aches, pain and fever symptoms as discussed Will check urine sample and if abnormal, will call you and send antibiotics for bladder infection Hydrate, rest and call if symptoms worse or unimproved

## 2011-12-10 NOTE — Telephone Encounter (Signed)
Noted. Thanks Should use Tessalon instead - erx done

## 2011-12-10 NOTE — Telephone Encounter (Signed)
Pt states she is allergic to hydrocodone. ? If she can take hydrocodone cough syrup. Requesting md to rx something else.Marland KitchenMarland Kitchen1/11/13@1 :08pm/LMB

## 2011-12-10 NOTE — Telephone Encounter (Signed)
Notified pt md response.Marland KitchenMarland Kitchen1/11/13@4 :09pm/LMB

## 2011-12-10 NOTE — Progress Notes (Signed)
  Subjective:    HPI  complains of cold symptoms  Onset 48h ago, wax/wane symptoms  associated with rhinorrhea, sneezing, sore throat, mild headache and low grade fever Also myalgias, sinus pressure and mild-mod chest congestion No relief with OTC meds Precipitated by sick contacts  Past Medical History  Diagnosis Date  . Hypertension   . Hypothyroid   . Cholelithiasis   . Fibromyalgia   . Chronic back pain   . Hyperlipidemia   . Constipation   . Anemia   . Personal history of colonic polyps 05/06/2010    ADENOMATOUS POLYP  . Internal hemorrhoids without mention of complication   . Esophageal reflux   . Arthritis     Review of Systems Constitutional: No fever or night sweats, no unexpected weight change Pulmonary: No pleurisy or hemoptysis Cardiovascular: No chest pain or palpitations Genitourinary: Positive for dysuria    Objective:   Physical Exam BP 110/82  Pulse 82  Temp(Src) 97.9 F (36.6 C) (Oral)  Wt 198 lb 1.9 oz (89.867 kg)  SpO2 99% GEN: mildly ill appearing and audible head/chest congestion HENT: NCAT, mild sinus tenderness bilaterally, nares with clear discharge, oropharynx mild erythema, no exudate Eyes: Vision grossly intact, no conjunctivitis Lungs: Clear to auscultation without rhonchi or wheeze, no increased work of breathing Cardiovascular: Regular rate and rhythm, no bilateral edema      Assessment & Plan:  Viral URI >> suspect flu Cough related to above Dysuria, ?UTI   Explained lack of efficacy for antibiotics in viral disease but empiric Tamiflu Prescription cough suppression - new prescriptions done Symptomatic care with Tylenol or Advil, hydration and rest -  salt gargle advised as needed  Check Udip - tx cipro if abn UA

## 2012-03-14 ENCOUNTER — Ambulatory Visit (INDEPENDENT_AMBULATORY_CARE_PROVIDER_SITE_OTHER): Payer: 59 | Admitting: Internal Medicine

## 2012-03-14 ENCOUNTER — Other Ambulatory Visit (INDEPENDENT_AMBULATORY_CARE_PROVIDER_SITE_OTHER): Payer: 59

## 2012-03-14 ENCOUNTER — Encounter: Payer: Self-pay | Admitting: Internal Medicine

## 2012-03-14 VITALS — BP 124/80 | HR 76 | Temp 97.4°F | Resp 16 | Ht 69.0 in | Wt 194.0 lb

## 2012-03-14 DIAGNOSIS — E039 Hypothyroidism, unspecified: Secondary | ICD-10-CM

## 2012-03-14 DIAGNOSIS — E1149 Type 2 diabetes mellitus with other diabetic neurological complication: Secondary | ICD-10-CM

## 2012-03-14 DIAGNOSIS — R202 Paresthesia of skin: Secondary | ICD-10-CM

## 2012-03-14 DIAGNOSIS — E876 Hypokalemia: Secondary | ICD-10-CM

## 2012-03-14 DIAGNOSIS — E781 Pure hyperglyceridemia: Secondary | ICD-10-CM

## 2012-03-14 DIAGNOSIS — E785 Hyperlipidemia, unspecified: Secondary | ICD-10-CM

## 2012-03-14 DIAGNOSIS — I10 Essential (primary) hypertension: Secondary | ICD-10-CM

## 2012-03-14 DIAGNOSIS — R7309 Other abnormal glucose: Secondary | ICD-10-CM

## 2012-03-14 DIAGNOSIS — E1142 Type 2 diabetes mellitus with diabetic polyneuropathy: Secondary | ICD-10-CM

## 2012-03-14 DIAGNOSIS — R209 Unspecified disturbances of skin sensation: Secondary | ICD-10-CM

## 2012-03-14 LAB — COMPREHENSIVE METABOLIC PANEL
Albumin: 4.1 g/dL (ref 3.5–5.2)
Alkaline Phosphatase: 90 U/L (ref 39–117)
BUN: 14 mg/dL (ref 6–23)
Glucose, Bld: 165 mg/dL — ABNORMAL HIGH (ref 70–99)
Total Bilirubin: 0.4 mg/dL (ref 0.3–1.2)

## 2012-03-14 LAB — LIPID PANEL
HDL: 44.6 mg/dL (ref >39.00)
Triglycerides: 299 mg/dL — ABNORMAL HIGH (ref 0.0–149.0)

## 2012-03-14 LAB — CBC WITH DIFFERENTIAL/PLATELET
Basophils Relative: 0.2 % (ref 0.0–3.0)
Eosinophils Absolute: 0 10*3/uL (ref 0.0–0.7)
Eosinophils Relative: 0.1 % (ref 0.0–5.0)
Hemoglobin: 13.7 g/dL (ref 12.0–15.0)
Lymphocytes Relative: 17.4 % (ref 12.0–46.0)
MCHC: 32.4 g/dL (ref 30.0–36.0)
MCV: 82.2 fl (ref 78.0–100.0)
Neutro Abs: 11.7 10*3/uL — ABNORMAL HIGH (ref 1.4–7.7)
RBC: 5.14 Mil/uL — ABNORMAL HIGH (ref 3.87–5.11)
WBC: 15 10*3/uL — ABNORMAL HIGH (ref 4.5–10.5)

## 2012-03-14 LAB — LDL CHOLESTEROL, DIRECT: Direct LDL: 152.3 mg/dL

## 2012-03-14 LAB — MAGNESIUM: Magnesium: 1.9 mg/dL (ref 1.5–2.5)

## 2012-03-14 LAB — HEMOGLOBIN A1C: Hgb A1c MFr Bld: 6.5 % (ref 4.6–6.5)

## 2012-03-14 MED ORDER — EZETIMIBE 10 MG PO TABS: 10.0000 mg | ORAL_TABLET | Freq: Every day | ORAL | Status: DC

## 2012-03-14 MED ORDER — PITAVASTATIN CALCIUM 4 MG PO TABS: 1.0000 | ORAL_TABLET | Freq: Every day | ORAL | Status: DC

## 2012-03-14 MED ORDER — POTASSIUM CHLORIDE CRYS ER 20 MEQ PO TBCR: 20.0000 meq | EXTENDED_RELEASE_TABLET | Freq: Three times a day (TID) | ORAL | Status: DC

## 2012-03-14 MED ORDER — LINAGLIPTIN 5 MG PO TABS: 5.0000 mg | ORAL_TABLET | Freq: Every day | ORAL | Status: DC

## 2012-03-14 NOTE — Assessment & Plan Note (Signed)
She feels like crestor caused a rash so she stopped taking it and the rash resolved, I will change her to livalo and zetia to lower her LDL and reduce her risk of MI and CVA

## 2012-03-14 NOTE — Assessment & Plan Note (Signed)
Check TSH level today

## 2012-03-14 NOTE — Progress Notes (Signed)
Subjective:    Patient ID: [name], female    DOB: 1951-05-22, 61 y.o.   MRN: [id number]  Diabetes She presents for her initial diabetic visit. She has type 2 diabetes mellitus. Her disease course has been stable. Pertinent negatives for hypoglycemia include no dizziness, headaches, pallor, seizures, speech difficulty or tremors. Associated symptoms include foot paresthesias. Pertinent negatives for diabetes include no blurred vision, no chest pain, no fatigue, no foot ulcerations, no polydipsia, no polyphagia, no polyuria, no visual change, no weakness and no weight loss. There are no hypoglycemic complications. Symptoms are stable. There are no diabetic complications. Risk factors for coronary artery disease include no known risk factors. When asked about current treatments, none were reported. Her weight is stable. She is following a generally healthy diet. Meal planning includes avoidance of concentrated sweets. She has not had a previous visit with a dietician. She participates in exercise intermittently. There is no change in her home blood glucose trend. An ACE inhibitor/angiotensin II receptor blocker is being taken. She does not see a podiatrist.Eye exam is current.  Hyperlipidemia This is a chronic problem. The current episode started more than 1 year ago. The problem is uncontrolled. Recent lipid tests were reviewed and are variable. Exacerbating diseases include diabetes, hypothyroidism and obesity. She has no history of chronic renal disease, liver disease or nephrotic syndrome. Factors aggravating her hyperlipidemia include thiazides and fatty foods. Pertinent negatives include no chest pain, focal sensory loss, focal weakness, leg pain, myalgias or shortness of breath. The current treatment provides no improvement of lipids. Compliance problems include medication side effects, adherence to exercise and adherence to diet (rash).       Review of Systems  Constitutional: Negative for  fever, chills, weight loss, diaphoresis, activity change, appetite change, fatigue and unexpected weight change.  HENT: Negative.   Eyes: Negative.  Negative for blurred vision.  Respiratory: Negative.  Negative for cough, chest tightness, shortness of breath and stridor.   Cardiovascular: Negative for chest pain, palpitations and leg swelling.  Gastrointestinal: Negative for nausea, vomiting, abdominal pain, diarrhea, constipation and anal bleeding.  Genitourinary: Negative.  Negative for polyuria.  Musculoskeletal: Negative for myalgias.  Skin: Negative for color change, pallor, rash and wound.  Neurological: Negative for dizziness, tremors, focal weakness, seizures, syncope, facial asymmetry, speech difficulty, weakness, light-headedness, numbness and headaches.  Hematological: Negative for polydipsia, polyphagia and adenopathy. Does not bruise/bleed easily.  Psychiatric/Behavioral: Negative.        Objective:   Physical Exam  Vitals reviewed. Constitutional: She is oriented to person, place, and time. She appears well-developed and well-nourished. No distress.  HENT:  Head: Normocephalic and atraumatic.  Mouth/Throat: Oropharynx is clear and moist. No oropharyngeal exudate.  Eyes: Conjunctivae are normal. Right eye exhibits no discharge. Left eye exhibits no discharge. No scleral icterus.  Neck: Normal range of motion. Neck supple. No JVD present. No tracheal deviation present. No thyromegaly present.  Cardiovascular: Normal rate, regular rhythm, normal heart sounds and intact distal pulses.  Exam reveals no gallop and no friction rub.   No murmur heard. Pulmonary/Chest: Effort normal and breath sounds normal. No stridor. No respiratory distress. She has no wheezes. She has no rales. She exhibits no tenderness.  Abdominal: Soft. Bowel sounds are normal. She exhibits no distension and no mass. There is no tenderness. There is no rebound and no guarding.  Musculoskeletal: Normal range  of motion. She exhibits no edema and no tenderness.  Lymphadenopathy:    She has no cervical adenopathy.  Neurological: She is oriented to person, place, and time.  Skin: Skin is warm and dry. No rash noted. She is not diaphoretic. No erythema. No pallor.  Psychiatric: She has a normal mood and affect. Her behavior is normal. Judgment and thought content normal.     Lab Results  Component Value Date   WBC 16.7 Repeated and verified X2.* 11/15/2011   HGB 13.7 11/15/2011   HCT 39.7 11/15/2011   PLT 385.0 11/15/2011   GLUCOSE 158* 11/15/2011   CHOL 279* 11/15/2011   TRIG 443.0 Triglyceride is over 400; calculations on Lipids are invalid.* 11/15/2011   HDL 50.60 11/15/2011   LDLDIRECT 176.1 11/15/2011   LDLCALC 64 10/20/2009   ALT 19 11/15/2011   AST 18 11/15/2011   NA 138 11/15/2011   K 3.3* 11/15/2011   CL 98 11/15/2011   CREATININE 1.0 11/15/2011   BUN 16 11/15/2011   CO2 28 11/15/2011   TSH 8.69* 11/15/2011   INR 1.0 07/23/2009   HGBA1C 7.6* 11/15/2011       Assessment & Plan:

## 2012-03-14 NOTE — Assessment & Plan Note (Signed)
Start tradjenta

## 2012-03-14 NOTE — Patient Instructions (Signed)
Hypercholesterolemia High Blood Cholesterol Cholesterol is a white, waxy, fat-like protein needed by your body in small amounts. The liver makes all the cholesterol you need. It is carried from the liver by the blood through the blood vessels. Deposits (plaque) may build up on blood vessel walls. This makes the arteries narrower and stiffer. Plaque increases the risk for heart attack and stroke. You cannot feel your cholesterol level even if it is very high. The only way to know is by a blood test to check your lipid (fats) levels. Once you know your cholesterol levels, you should keep a record of the test results. Work with your caregiver to to keep your levels in the desired range. WHAT THE RESULTS MEAN:  Total cholesterol is a rough measure of all the cholesterol in your blood.   LDL is the so-called bad cholesterol. This is the type that deposits cholesterol in the walls of the arteries. You want this level to be low.   HDL is the good cholesterol because it cleans the arteries and carries the LDL away. You want this level to be high.   Triglycerides are fat that the body can either burn for energy or store. High levels are closely linked to heart disease.  DESIRED LEVELS:  Total cholesterol below 200.   LDL below 100 for people at risk, below 70 for very high risk.   HDL above 50 is good, above 60 is best.   Triglycerides below 150.  HOW TO LOWER YOUR CHOLESTEROL:  Diet.   Choose fish or white meat chicken and [place], roasted or baked. Limit fatty cuts of red meat, fried foods, and processed meats, such as sausage and lunch meat.   Eat lots of fresh fruits and vegetables. Choose whole grains, beans, pasta, potatoes and cereals.   Use only small amounts of olive, corn or canola oils. Avoid butter, mayonnaise, shortening or palm kernel oils. Avoid foods with trans-fats.   Use skim/nonfat milk and low-fat/nonfat yogurt and cheeses. Avoid whole milk, cream, ice cream, egg yolks and  cheeses. Healthy desserts include angel food cake, gingersnaps, animal crackers, hard candy, popsicles, and low-fat/nonfat frozen yogurt. Avoid pastries, cakes, pies and cookies.   Exercise.   A regular program helps decrease LDL and raises HDL.   Helps with weight control.   Do things that increase your activity level like gardening, walking, or taking the stairs.   Medication.   May be prescribed by your caregiver to help lowering cholesterol and the risk for heart disease.   You may need medicine even if your levels are normal if you have several risk factors.  HOME CARE INSTRUCTIONS   Follow your diet and exercise programs as suggested by your caregiver.   Take medications as directed.   Have blood work done when your caregiver feels it is necessary.  MAKE SURE YOU:   Understand these instructions.   Will watch your condition.   Will get help right away if you are not doing well or get worse.  Document Released: 11/15/2005 Document Revised: 11/04/2011 Document Reviewed: 05/03/2007 [facility] Patient Information 2012 [place].Diabetes, Type 2 Diabetes is a long-lasting (chronic) disease. In type 2 diabetes, the pancreas does not make enough insulin (a hormone), and the body does not respond normally to the insulin that is made. This type of diabetes was also previously called adult-onset diabetes. It usually occurs after the age of 72, but it can occur at any age.  CAUSES  Type 2 diabetes happens because the  pancreasis not making enough insulin or your body has trouble using the insulin that your pancreas does make properly. SYMPTOMS   Drinking more than usual.   Urinating more than usual.   Blurred vision.   Dry, itchy skin.   Frequent infections.   Feeling more tired than usual (fatigue).  DIAGNOSIS The diagnosis of type 2 diabetes is usually made by one of the following tests:  Fasting blood glucose test. You will not eat for at least 8 hours and then  take a blood test.   Random blood glucose test. Your blood glucose (sugar) is checked at any time of the day regardless of when you ate.   Oral glucose tolerance test (OGTT). Your blood glucose is measured after you have not eaten (fasted) and then after you drink a glucose containing beverage.  TREATMENT   Healthy eating.   Exercise.   Medicine, if needed.   Monitoring blood glucose.   Seeing your caregiver regularly.  HOME CARE INSTRUCTIONS   Check your blood glucose at least once a day. More frequent monitoring may be necessary, depending on your medicines and on how well your diabetes is controlled. Your caregiver will advise you.   Take your medicine as directed by your caregiver.   Do not smoke.   Make wise food choices. Ask your caregiver for information. Weight loss can improve your diabetes.   Learn about low blood glucose (hypoglycemia) and how to treat it.   Get your eyes checked regularly.   Have a yearly physical exam. Have your blood pressure checked and your blood and urine tested.   Wear a pendant or bracelet saying that you have diabetes.   Check your feet every night for cuts, sores, blisters, and redness. Let your caregiver know if you have any problems.  SEEK MEDICAL CARE IF:   You have problems keeping your blood glucose in target range.   You have problems with your medicines.   You have symptoms of an illness that do not improve after 24 hours.   You have a sore or wound that is not healing.   You notice a change in vision or a new problem with your vision.   You have a fever.  MAKE SURE YOU:  Understand these instructions.   Will watch your condition.   Will get help right away if you are not doing well or get worse.  Document Released: 11/15/2005 Document Revised: 11/04/2011 Document Reviewed: 05/03/2011 [facility] Patient Information 2012 [place].

## 2012-03-14 NOTE — Assessment & Plan Note (Signed)
She needs to restart some K+ replacement

## 2012-03-14 NOTE — Assessment & Plan Note (Signed)
Her NCS and EMG showed a pattern c/w her lumbar history and DM

## 2012-03-14 NOTE — Assessment & Plan Note (Signed)
I will recheck her FLP today

## 2012-03-14 NOTE — Assessment & Plan Note (Signed)
Her BP is well controlled, I will check her lytes and renal function 

## 2012-04-17 ENCOUNTER — Telehealth: Payer: Self-pay | Admitting: Internal Medicine

## 2012-04-17 NOTE — Telephone Encounter (Signed)
Samples are on LaKisha's desk

## 2012-04-17 NOTE — Telephone Encounter (Signed)
Patient notified

## 2012-04-17 NOTE — Telephone Encounter (Signed)
Pt requesting nexium and synthroid  samples--

## 2012-06-14 ENCOUNTER — Ambulatory Visit (INDEPENDENT_AMBULATORY_CARE_PROVIDER_SITE_OTHER): Payer: 59 | Admitting: Internal Medicine

## 2012-06-14 ENCOUNTER — Other Ambulatory Visit (INDEPENDENT_AMBULATORY_CARE_PROVIDER_SITE_OTHER): Payer: 59

## 2012-06-14 ENCOUNTER — Encounter: Payer: Self-pay | Admitting: Internal Medicine

## 2012-06-14 ENCOUNTER — Telehealth: Payer: Self-pay | Admitting: *Deleted

## 2012-06-14 VITALS — BP 138/84 | HR 84 | Temp 97.7°F | Resp 16 | Wt 195.4 lb

## 2012-06-14 DIAGNOSIS — E785 Hyperlipidemia, unspecified: Secondary | ICD-10-CM

## 2012-06-14 DIAGNOSIS — R079 Chest pain, unspecified: Secondary | ICD-10-CM

## 2012-06-14 DIAGNOSIS — E1142 Type 2 diabetes mellitus with diabetic polyneuropathy: Secondary | ICD-10-CM

## 2012-06-14 DIAGNOSIS — E039 Hypothyroidism, unspecified: Secondary | ICD-10-CM

## 2012-06-14 DIAGNOSIS — E876 Hypokalemia: Secondary | ICD-10-CM

## 2012-06-14 DIAGNOSIS — I10 Essential (primary) hypertension: Secondary | ICD-10-CM

## 2012-06-14 DIAGNOSIS — E1149 Type 2 diabetes mellitus with other diabetic neurological complication: Secondary | ICD-10-CM

## 2012-06-14 DIAGNOSIS — E781 Pure hyperglyceridemia: Secondary | ICD-10-CM

## 2012-06-14 DIAGNOSIS — R9431 Abnormal electrocardiogram [ECG] [EKG]: Secondary | ICD-10-CM

## 2012-06-14 LAB — COMPREHENSIVE METABOLIC PANEL
ALT: 16 U/L (ref 0–35)
BUN: 16 mg/dL (ref 6–23)
CO2: 30 mEq/L (ref 19–32)
Calcium: 9.3 mg/dL (ref 8.4–10.5)
Chloride: 103 mEq/L (ref 96–112)
Creatinine, Ser: 0.8 mg/dL (ref 0.4–1.2)
GFR: 95.13 mL/min (ref >60.00)

## 2012-06-14 LAB — CBC WITH DIFFERENTIAL/PLATELET
Basophils Relative: 0.7 % (ref 0.0–3.0)
Eosinophils Absolute: 0.2 10*3/uL (ref 0.0–0.7)
Eosinophils Relative: 1.8 % (ref 0.0–5.0)
Hemoglobin: 11.8 g/dL — ABNORMAL LOW (ref 12.0–15.0)
Lymphocytes Relative: 31.1 % (ref 12.0–46.0)
MCHC: 33.2 g/dL (ref 30.0–36.0)
MCV: 80 fl (ref 78.0–100.0)
Monocytes Absolute: 0.4 10*3/uL (ref 0.1–1.0)
Neutro Abs: 5.1 10*3/uL (ref 1.4–7.7)
RBC: 4.46 Mil/uL (ref 3.87–5.11)
WBC: 8.3 10*3/uL (ref 4.5–10.5)

## 2012-06-14 LAB — LIPID PANEL
Cholesterol: 185 mg/dL (ref 0–200)
Total CHOL/HDL Ratio: 4

## 2012-06-14 LAB — TSH: TSH: 0.77 u[IU]/mL (ref 0.35–5.50)

## 2012-06-14 LAB — TROPONIN I: Troponin I: <0.01 ng/mL (ref <0.06)

## 2012-06-14 NOTE — Telephone Encounter (Signed)
Received call from [facility] concerning stat results. D-Dimer normal 0.45 & Troponin less than 0.01. Verbally gave results to Dr. Asa Lente & she states was normal & ok to send md phone note.Marland KitchenMarland Kitchen7/16/16@5 :19pm/LMB

## 2012-06-14 NOTE — Patient Instructions (Addendum)
Chest Pain (Nonspecific) It is often hard to give a specific diagnosis for the cause of chest pain. There is always a chance that your pain could be related to something serious, such as a heart attack or a blood clot in the lungs. You need to follow up with your caregiver for further evaluation. CAUSES   Heartburn.   Pneumonia or bronchitis.   Anxiety or stress.   Inflammation around your heart (pericarditis) or lung (pleuritis or pleurisy).   A blood clot in the lung.   A collapsed lung (pneumothorax). It can develop suddenly on its own (spontaneous pneumothorax) or from injury (trauma) to the chest.   Shingles infection (herpes zoster virus).  The chest wall is composed of bones, muscles, and cartilage. Any of these can be the source of the pain.  The bones can be bruised by injury.   The muscles or cartilage can be strained by coughing or overwork.   The cartilage can be affected by inflammation and become sore (costochondritis).  DIAGNOSIS  Lab tests or other studies, such as X-rays, electrocardiography, stress testing, or cardiac imaging, may be needed to find the cause of your pain.  TREATMENT   Treatment depends on what may be causing your chest pain. Treatment may include:   Acid blockers for heartburn.   Anti-inflammatory medicine.   Pain medicine for inflammatory conditions.   Antibiotics if an infection is present.   You may be advised to change lifestyle habits. This includes stopping smoking and avoiding alcohol, caffeine, and chocolate.   You may be advised to keep your head raised (elevated) when sleeping. This reduces the chance of acid going backward from your stomach into your esophagus.   Most of the time, nonspecific chest pain will improve within 2 to 3 days with rest and mild pain medicine.  HOME CARE INSTRUCTIONS   If antibiotics were prescribed, take your antibiotics as directed. Finish them even if you start to feel better.   For the next few  days, avoid physical activities that bring on chest pain. Continue physical activities as directed.   Do not smoke.   Avoid drinking alcohol.   Only take over-the-counter or prescription medicine for pain, discomfort, or fever as directed by your caregiver.   Follow your caregiver's suggestions for further testing if your chest pain does not go away.   Keep any follow-up appointments you made. If you do not go to an appointment, you could develop lasting (chronic) problems with pain. If there is any problem keeping an appointment, you must call to reschedule.  SEEK MEDICAL CARE IF:   You think you are having problems from the medicine you are taking. Read your medicine instructions carefully.   Your chest pain does not go away, even after treatment.   You develop a rash with blisters on your chest.  SEEK IMMEDIATE MEDICAL CARE IF:   You have increased chest pain or pain that spreads to your arm, neck, jaw, back, or abdomen.   You develop shortness of breath, an increasing cough, or you are coughing up blood.   You have severe back or abdominal pain, feel nauseous, or vomit.   You develop severe weakness, fainting, or chills.   You have a fever.  THIS IS AN EMERGENCY. Do not wait to see if the pain will go away. Get medical help at once. Call your local emergency services (911 in [place] Do not drive yourself to the hospital. MAKE SURE YOU:   Understand these instructions.     Will watch your condition.   Will get help right away if you are not doing well or get worse.  Document Released: 08/25/2005 Document Revised: 11/04/2011 Document Reviewed: 06/20/2008 [employer] Patient Information 2012 [employer]. 

## 2012-06-14 NOTE — Progress Notes (Signed)
Subjective:    Patient ID: [name], female    DOB: 1951-10-24, 61 y.o.   MRN: [id number]  Chest Pain  This is a new problem. Episode onset: one week ago, at rest, sharp, resolved after a few minutes. The onset quality is sudden. The problem has been resolved. The pain is present in the lateral region (left lower chest area). The pain is at a severity of 2/10. The pain is mild. The quality of the pain is described as sharp. The pain does not radiate. Pertinent negatives include no abdominal pain, back pain, claudication, cough, diaphoresis, dizziness, exertional chest pressure, fever, headaches, hemoptysis, irregular heartbeat, leg pain, lower extremity edema, malaise/fatigue, nausea, near-syncope, numbness, palpitations, PND, shortness of breath, sputum production, syncope, vomiting or weakness. The pain is aggravated by nothing. She has tried nothing for the symptoms.  Her past medical history is significant for diabetes and hyperlipidemia.  Pertinent negatives for past medical history include no seizures.  Hyperlipidemia This is a chronic problem. The current episode started more than 1 year ago. The problem is controlled. Recent lipid tests were reviewed and are variable. Exacerbating diseases include diabetes, hypothyroidism and obesity. She has no history of chronic renal disease, liver disease or nephrotic syndrome. Factors aggravating her hyperlipidemia include thiazides, fatty foods and beta blockers. Associated symptoms include chest pain and myalgias (aching in her upper arms). Pertinent negatives include no focal sensory loss, focal weakness, leg pain or shortness of breath. Current antihyperlipidemic treatment includes statins and ezetimibe. The current treatment provides moderate improvement of lipids. Compliance problems include adherence to exercise and adherence to diet.       Review of Systems  Constitutional: Negative for fever, chills, malaise/fatigue, diaphoresis, appetite  change, fatigue and unexpected weight change.  HENT: Negative.   Eyes: Negative.   Respiratory: Negative for cough, hemoptysis, sputum production, chest tightness, shortness of breath, wheezing and stridor.   Cardiovascular: Positive for chest pain. Negative for palpitations, claudication, leg swelling, syncope, PND and near-syncope.  Gastrointestinal: Negative for nausea, vomiting, abdominal pain, diarrhea, constipation and blood in stool.  Genitourinary: Negative.   Musculoskeletal: Positive for myalgias (aching in her upper arms). Negative for back pain, joint swelling, arthralgias and gait problem.  Skin: Negative for color change, pallor, rash and wound.  Neurological: Negative for dizziness, tremors, focal weakness, seizures, syncope, facial asymmetry, speech difficulty, weakness, light-headedness, numbness and headaches.  Hematological: Negative for adenopathy. Does not bruise/bleed easily.  Psychiatric/Behavioral: Negative.        Objective:   Physical Exam  Vitals reviewed. Constitutional: She is oriented to person, place, and time. She appears well-developed and well-nourished.  Non-toxic appearance. She does not have a sickly appearance. She does not appear ill. No distress.  HENT:  Head: Normocephalic and atraumatic.  Mouth/Throat: Oropharynx is clear and moist. No oropharyngeal exudate.  Eyes: Conjunctivae are normal. Right eye exhibits no discharge. Left eye exhibits no discharge. No scleral icterus.  Neck: Normal range of motion. Neck supple. No JVD present. No tracheal deviation present. No thyromegaly present.  Cardiovascular: Normal rate, regular rhythm, S1 normal, S2 normal, normal heart sounds and intact distal pulses.  Exam reveals no gallop, no S3, no S4 and no friction rub.   No murmur heard. Pulses:      Carotid pulses are 1+ on the right side, and 1+ on the left side.      Radial pulses are 1+ on the right side, and 1+ on the left side.  Femoral pulses are  1+ on the right side, and 1+ on the left side.      Popliteal pulses are 1+ on the right side, and 1+ on the left side.       Dorsalis pedis pulses are 1+ on the right side, and 1+ on the left side.       Posterior tibial pulses are 1+ on the right side, and 1+ on the left side.  Pulmonary/Chest: Effort normal and breath sounds normal. No accessory muscle usage or stridor. Not tachypneic. No respiratory distress. She has no decreased breath sounds. She has no wheezes. She has no rhonchi. She has no rales. Chest wall is not dull to percussion. She exhibits no mass, no tenderness, no bony tenderness, no laceration, no crepitus, no edema, no deformity, no swelling and no retraction.  Abdominal: Soft. Bowel sounds are normal. She exhibits no distension and no mass. There is no tenderness. There is no rebound and no guarding.  Musculoskeletal: Normal range of motion. She exhibits no edema and no tenderness.  Lymphadenopathy:    She has no cervical adenopathy.  Neurological: She is oriented to person, place, and time.  Skin: Skin is warm and dry. No rash noted. She is not diaphoretic. No erythema. No pallor.  Psychiatric: She has a normal mood and affect. Her behavior is normal. Judgment and thought content normal.      Lab Results  Component Value Date   WBC 15.0* 03/14/2012   HGB 13.7 03/14/2012   HCT 42.3 03/14/2012   PLT 371.0 03/14/2012   GLUCOSE 165* 03/14/2012   CHOL 234* 03/14/2012   TRIG 299.0* 03/14/2012   HDL 44.60 03/14/2012   LDLDIRECT 152.3 03/14/2012   LDLCALC 64 10/20/2009   ALT 14 03/14/2012   AST 16 03/14/2012   NA 139 03/14/2012   K 3.0* 03/14/2012   CL 99 03/14/2012   CREATININE 0.9 03/14/2012   BUN 14 03/14/2012   CO2 29 03/14/2012   TSH 1.02 03/14/2012   INR 1.0 07/23/2009   HGBA1C 6.5 03/14/2012      Assessment & Plan:

## 2012-06-14 NOTE — Assessment & Plan Note (Signed)
FLP today 

## 2012-06-14 NOTE — Assessment & Plan Note (Signed)
Her BP is well controlled, I will check her lytes and renal function 

## 2012-06-14 NOTE — Assessment & Plan Note (Signed)
I will recheck her K+ and Mg++ levels today

## 2012-06-14 NOTE — Assessment & Plan Note (Signed)
I will check her TSH today 

## 2012-06-14 NOTE — Assessment & Plan Note (Signed)
She has muscle aches so will stop livalo and check a CPK level today

## 2012-06-14 NOTE — Assessment & Plan Note (Signed)
She has atypical CP and several risk factors for CAD, her EKG shows RBBB (no old EKG's for comparison) - may be a normal variant. I will check her enzymes today for ischemia, CHF, PE. Also, I have asked her to see cardiology.

## 2012-06-14 NOTE — Assessment & Plan Note (Signed)
RBBB, may be a normal variant, will check enzymes today and have asked her to see cardiology

## 2012-06-14 NOTE — Assessment & Plan Note (Signed)
I will check her a1c today and will monitor her renal function

## 2012-06-15 ENCOUNTER — Encounter: Payer: Self-pay | Admitting: Internal Medicine

## 2012-06-15 LAB — CARDIAC PANEL: Relative Index: 1 calc (ref 0.0–2.5)

## 2012-06-19 ENCOUNTER — Ambulatory Visit (INDEPENDENT_AMBULATORY_CARE_PROVIDER_SITE_OTHER): Payer: 59 | Admitting: Cardiovascular Disease

## 2012-06-19 ENCOUNTER — Encounter: Payer: Self-pay | Admitting: Cardiovascular Disease

## 2012-06-19 VITALS — BP 100/74 | HR 92 | Ht 67.0 in | Wt 194.0 lb

## 2012-06-19 DIAGNOSIS — I10 Essential (primary) hypertension: Secondary | ICD-10-CM

## 2012-06-19 DIAGNOSIS — E785 Hyperlipidemia, unspecified: Secondary | ICD-10-CM

## 2012-06-19 DIAGNOSIS — R079 Chest pain, unspecified: Secondary | ICD-10-CM

## 2012-06-19 NOTE — Patient Instructions (Addendum)
Your physician has requested that you have a stress echocardiogram. For further information please visit [email or web address]. Please follow instruction sheet as given.  Will call you with results.

## 2012-06-27 ENCOUNTER — Encounter: Payer: Self-pay | Admitting: Cardiovascular Disease

## 2012-06-27 NOTE — Assessment & Plan Note (Signed)
Blood pressure is well controlled on current medical program.

## 2012-06-27 NOTE — Progress Notes (Signed)
HPI:  This is a 61 year old woman presenting for initial cardiac evaluation. She was referred for evaluation of chest pain. The patient described a single episode of left-sided sharp pain in the chest that occurred at rest. Symptoms lasted for several minutes and they were not affected by palpation of the chest wall or by movement. There were no changes with deep inspiration. The pain was nonradiating. There was no associated shortness of breath, diaphoresis, or lightheadedness.  EKG showed a sinus rhythm with an incomplete right bundle branch block but no acute ST segment changes. The patient has not engaged in regular physical activity, but she denies any exertional chest tightness. She's had no personal history of cardiac problems in the past.  Outpatient Encounter Prescriptions as of 06/19/2012  Medication Sig Dispense Refill  . clonazePAM (KLONOPIN) 0.5 MG tablet Take 1 tablet (0.5 mg total) by mouth 2 (two) times daily as needed for anxiety.  60 tablet  0  . cyanocobalamin 100 MCG tablet Take 1 tablet (100 mcg total) by mouth daily.  30 tablet  11  . cyclobenzaprine (FLEXERIL) 10 MG tablet Take 10 mg by mouth 3 (three) times daily as needed.      Marland Kitchen esomeprazole (NEXIUM) 40 MG capsule Take 1 capsule (40 mg total) by mouth daily.  50 capsule  0  . ezetimibe (ZETIA) 10 MG tablet Take 1 tablet (10 mg total) by mouth daily.  90 tablet  3  . folic acid (FOLVITE) 1 MG tablet Take 1 mg by mouth daily.        Marland Kitchen levothyroxine (SYNTHROID) 125 MCG tablet Take 1 tablet (125 mcg total) by mouth daily.  84 tablet  0  . linagliptin (TRADJENTA) 5 MG TABS tablet Take 1 tablet (5 mg total) by mouth daily.  140 tablet  0  . olmesartan-hydrochlorothiazide (BENICAR HCT) 20-12.5 MG per tablet Take 1 tablet by mouth daily.  75 tablet  0  . polyethylene glycol (MIRALAX / GLYCOLAX) packet Take 17 g by mouth as needed.        . potassium chloride SA (K-DUR,KLOR-CON) 20 MEQ tablet Take 1 tablet (20 mEq total) by mouth 3  (three) times daily.  90 tablet  11  . traMADol (ULTRAM) 50 MG tablet Take 50 mg by mouth every 6 (six) hours as needed.      . predniSONE (DELTASONE) 10 MG tablet Take 20 mg by mouth daily. As needed      . DISCONTD: methotrexate (RHEUMATREX) 2.5 MG tablet Take 2.5 mg by mouth. Caution:Chemotherapy. Protect from light. Take 3 tabs BID on wednesday       . DISCONTD: metoprolol succinate (TOPROL-XL) 25 MG 24 hr tablet TAKE 1 TABLET BY MOUTH EVERY DAY  30 tablet  7    Livalo; Aspirin; Atorvastatin; Gadolinium; Hydrocodone-acetaminophen; Iohexol; and Crestor  Past Medical History  Diagnosis Date  . Hypertension   . Hypothyroid   . Cholelithiasis   . Fibromyalgia   . Chronic back pain   . Hyperlipidemia   . Constipation   . Anemia   . Personal history of colonic polyps 05/06/2010    ADENOMATOUS POLYP  . Internal hemorrhoids without mention of complication   . Esophageal reflux   . Arthritis     Past Surgical History  Procedure Date  . Abdominal hysterectomy   . Lumbar disc surgery   . Tonsillectomy   . Cholecystectomy   . Cervical disc surgery     PLATE IN NECK & BACK    History  Social History  . Marital Status: Married    Spouse Name: N/A    Number of Children: N/A  . Years of Education: N/A   Occupational History  . Not on file.   Social History Main Topics  . Smoking status: Never Smoker   . Smokeless tobacco: Never Used  . Alcohol Use: No  . Drug Use: No  . Sexually Active: Not Currently   Other Topics Concern  . Not on file   Social History Narrative   Daily Caffeine Use    Family History  Problem Relation Age of Onset  . Diabetes Mother     aunts and uncles  . Kidney disease Mother     stage 3  . Lung disease Mother     colapsed lung/in hospice  . COPD Mother   . Hypertension Mother   . Dementia Mother   . Breast cancer    . Heart failure Father     Mother  . Colon cancer Paternal Aunt     ROS: General: no fevers/chills/night  sweats Eyes: no blurry vision, diplopia, or amaurosis ENT: no sore throat or hearing loss Resp: no cough, wheezing, or hemoptysis CV: no edema or palpitations GI: no abdominal pain, nausea, vomiting, diarrhea, or constipation GU: no dysuria, frequency, or hematuria Skin: no rash Neuro: no headache, numbness, tingling, or weakness of extremities Musculoskeletal: Positive for arthritis. Positive for low back pain. Heme: no bleeding, DVT, or easy bruising Endo: no polydipsia or polyuria  BP 100/74  Pulse 92  Ht 5' 7"  (1.702 m)  Wt 194 lb (87.998 kg)  BMI 30.38 kg/m2  PHYSICAL EXAM: Pt is alert and oriented, WD, WN, in no distress. HEENT: normal Neck: JVP normal. Carotid upstrokes normal without bruits. No thyromegaly. Lungs: equal expansion, clear bilaterally CV: Apex is discrete and nondisplaced, RRR without murmur or gallop Abd: soft, NT, +BS, no bruit, no hepatosplenomegaly Back: no CVA tenderness Ext: no C/C/E        Femoral pulses 2+= without bruits        DP/PT pulses intact and = Skin: warm and dry without rash Neuro: CNII-XII intact             Strength intact = bilaterally  EKG:  Normal sinus rhythm with incomplete right bundle branch block  ASSESSMENT AND PLAN:

## 2012-06-27 NOTE — Assessment & Plan Note (Signed)
This is a 61 year old woman with multiple cardiovascular risk factors of diabetes, hypertension, and hypercholesterolemia. She's had a single episode of chest pain which is primarily atypical. I think it would be reasonable to proceed with a dobutamine stress echocardiogram to rule out inducible ischemia. The patient is unable to give a good exercise study because of her back problems. She otherwise will continue on her current medical program. She is allergic to both aspirin and statin drugs. She is tolerating an angiotensin receptor blocker in the setting of her diabetes. As long as her stress echo was within normal limits, I would not anticipate further cardiac testing.

## 2012-06-27 NOTE — Assessment & Plan Note (Signed)
The patient is statin intolerant and she is treated with zetia. Her direct LDL was 103. Lifestyle modification advised.

## 2012-06-28 ENCOUNTER — Ambulatory Visit (HOSPITAL_COMMUNITY): Payer: 59 | Attending: Cardiovascular Disease

## 2012-06-28 ENCOUNTER — Encounter: Payer: Self-pay | Admitting: Cardiovascular Disease

## 2012-06-28 DIAGNOSIS — R079 Chest pain, unspecified: Secondary | ICD-10-CM

## 2012-06-28 DIAGNOSIS — R072 Precordial pain: Secondary | ICD-10-CM

## 2012-06-28 NOTE — Progress Notes (Signed)
Echocardiogram performed.  

## 2012-07-03 ENCOUNTER — Other Ambulatory Visit: Payer: Self-pay | Admitting: Internal Medicine

## 2012-07-03 DIAGNOSIS — Z1231 Encounter for screening mammogram for malignant neoplasm of breast: Secondary | ICD-10-CM

## 2012-07-07 ENCOUNTER — Telehealth: Payer: Self-pay | Admitting: Internal Medicine

## 2012-07-07 DIAGNOSIS — E039 Hypothyroidism, unspecified: Secondary | ICD-10-CM

## 2012-07-07 DIAGNOSIS — I10 Essential (primary) hypertension: Secondary | ICD-10-CM

## 2012-07-07 MED ORDER — LEVOTHYROXINE SODIUM 125 MCG PO TABS: 125.0000 ug | ORAL_TABLET | Freq: Every day | ORAL | Status: DC

## 2012-07-07 MED ORDER — ESOMEPRAZOLE MAGNESIUM 40 MG PO CPDR: 40.0000 mg | DELAYED_RELEASE_CAPSULE | Freq: Every day | ORAL | Status: DC

## 2012-07-07 MED ORDER — OLMESARTAN MEDOXOMIL-HCTZ 20-12.5 MG PO TABS: 1.0000 | ORAL_TABLET | Freq: Every day | ORAL | Status: DC

## 2012-07-07 NOTE — Telephone Encounter (Signed)
Pt notified samples are ready for pick-up.

## 2012-07-14 ENCOUNTER — Encounter: Payer: Self-pay | Admitting: Internal Medicine

## 2012-07-14 ENCOUNTER — Ambulatory Visit: Payer: 59 | Admitting: Internal Medicine

## 2012-07-14 ENCOUNTER — Other Ambulatory Visit (INDEPENDENT_AMBULATORY_CARE_PROVIDER_SITE_OTHER): Payer: 59

## 2012-07-14 ENCOUNTER — Ambulatory Visit (INDEPENDENT_AMBULATORY_CARE_PROVIDER_SITE_OTHER): Payer: 59 | Admitting: Internal Medicine

## 2012-07-14 VITALS — BP 104/70 | HR 74 | Temp 97.7°F | Resp 16 | Wt 196.0 lb

## 2012-07-14 DIAGNOSIS — F411 Generalized anxiety disorder: Secondary | ICD-10-CM

## 2012-07-14 DIAGNOSIS — F419 Anxiety disorder, unspecified: Secondary | ICD-10-CM

## 2012-07-14 DIAGNOSIS — E039 Hypothyroidism, unspecified: Secondary | ICD-10-CM

## 2012-07-14 DIAGNOSIS — IMO0001 Reserved for inherently not codable concepts without codable children: Secondary | ICD-10-CM

## 2012-07-14 DIAGNOSIS — I10 Essential (primary) hypertension: Secondary | ICD-10-CM

## 2012-07-14 LAB — CBC WITH DIFFERENTIAL/PLATELET
Eosinophils Relative: 1.6 % (ref 0.0–5.0)
Monocytes Relative: 6.1 % (ref 3.0–12.0)
Neutrophils Relative %: 63.7 % (ref 43.0–77.0)
Platelets: 314 10*3/uL (ref 150.0–400.0)
WBC: 8.4 10*3/uL (ref 4.5–10.5)

## 2012-07-14 MED ORDER — CLONAZEPAM 0.5 MG PO TABS: 0.5000 mg | ORAL_TABLET | Freq: Two times a day (BID) | ORAL | Status: DC | PRN

## 2012-07-14 NOTE — Progress Notes (Signed)
  Subjective:    Patient ID: [name], female    DOB: Dec 02, 1950, 61 y.o.   MRN: [id number]  Thyroid Problem Presents for follow-up visit. Symptoms include anxiety and weight gain. Patient reports no cold intolerance, constipation, depressed mood, diaphoresis, diarrhea, dry skin, fatigue, hair loss, heat intolerance, hoarse voice, leg swelling, nail problem, palpitations, tremors, visual change or weight loss. The symptoms have been stable.      Review of Systems  Constitutional: Positive for weight gain. Negative for weight loss, diaphoresis and fatigue.  HENT: Negative.  Negative for hoarse voice.   Eyes: Negative.   Respiratory: Negative.   Cardiovascular: Negative.  Negative for palpitations.  Gastrointestinal: Negative.  Negative for diarrhea and constipation.  Genitourinary: Negative.   Musculoskeletal: Negative.   Skin: Negative.   Neurological: Negative.  Negative for tremors.  Hematological: Negative.  Negative for cold intolerance and heat intolerance.  Psychiatric/Behavioral: Negative for suicidal ideas, hallucinations, behavioral problems, confusion, disturbed wake/sleep cycle, self-injury, dysphoric mood and decreased concentration. The patient is nervous/anxious. The patient is not hyperactive.        Objective:   Physical Exam  Vitals reviewed. Constitutional: She is oriented to person, place, and time. She appears well-developed and well-nourished. No distress.  HENT:  Head: Normocephalic and atraumatic.  Mouth/Throat: Oropharynx is clear and moist.  Eyes: Conjunctivae are normal. Right eye exhibits no discharge. Left eye exhibits no discharge. No scleral icterus.  Neck: Normal range of motion. Neck supple. No JVD present. No tracheal deviation present. No thyromegaly present.  Cardiovascular: Normal rate, regular rhythm, normal heart sounds and intact distal pulses.  Exam reveals no gallop and no friction rub.   No murmur heard. Pulmonary/Chest: Effort normal  and breath sounds normal. No stridor. No respiratory distress. She has no wheezes. She has no rales. She exhibits no tenderness.  Abdominal: Soft. Bowel sounds are normal. She exhibits no distension. There is no tenderness. There is no rebound and no guarding.  Musculoskeletal: Normal range of motion. She exhibits no edema and no tenderness.  Lymphadenopathy:    She has no cervical adenopathy.  Neurological: She is oriented to person, place, and time.  Skin: Skin is warm and dry. No rash noted. She is not diaphoretic. No erythema. No pallor.  Psychiatric: She has a normal mood and affect. Her behavior is normal. Judgment and thought content normal.     Lab Results  Component Value Date   WBC 8.3 06/14/2012   HGB 11.8* 06/14/2012   HCT 35.7* 06/14/2012   PLT 296.0 06/14/2012   GLUCOSE 118* 06/14/2012   CHOL 185 06/14/2012   TRIG 215.0* 06/14/2012   HDL 43.10 06/14/2012   LDLDIRECT 103.1 06/14/2012   LDLCALC 64 10/20/2009   ALT 16 06/14/2012   AST 16 06/14/2012   NA 139 06/14/2012   K 3.7 06/14/2012   CL 103 06/14/2012   CREATININE 0.8 06/14/2012   BUN 16 06/14/2012   CO2 30 06/14/2012   TSH 0.77 06/14/2012   INR 1.0 07/23/2009   HGBA1C 6.1 06/14/2012       Assessment & Plan:

## 2012-07-14 NOTE — Patient Instructions (Signed)

## 2012-07-14 NOTE — Assessment & Plan Note (Signed)
Recent A1C looks real good

## 2012-07-14 NOTE — Assessment & Plan Note (Signed)
Recent TSh was normal, weight gain is a behavioral issue

## 2012-07-14 NOTE — Assessment & Plan Note (Signed)
Continue benzo as needed

## 2012-07-14 NOTE — Assessment & Plan Note (Signed)
Cardiology stopped metoprolol, BP is well controlled

## 2012-08-09 ENCOUNTER — Ambulatory Visit (HOSPITAL_COMMUNITY)
Admission: RE | Admit: 2012-08-09 | Discharge: 2012-08-09 | Disposition: A | Discharge status: Home / Self Care | Payer: 59 | Source: Ambulatory Visit | Attending: Internal Medicine | Admitting: Internal Medicine

## 2012-08-09 DIAGNOSIS — Z1231 Encounter for screening mammogram for malignant neoplasm of breast: Secondary | ICD-10-CM

## 2012-08-11 LAB — HM MAMMOGRAPHY: HM Mammogram: NORMAL

## 2012-09-18 ENCOUNTER — Telehealth: Payer: Self-pay

## 2012-09-18 NOTE — Telephone Encounter (Signed)
Patient called requesting samples. Returned call to advise samples ready.

## 2012-10-11 ENCOUNTER — Other Ambulatory Visit: Payer: Self-pay

## 2012-10-11 DIAGNOSIS — E039 Hypothyroidism, unspecified: Secondary | ICD-10-CM

## 2012-10-11 MED ORDER — LEVOTHYROXINE SODIUM 125 MCG PO TABS: 125.0000 ug | ORAL_TABLET | Freq: Every day | ORAL | Status: DC

## 2012-10-11 NOTE — Telephone Encounter (Signed)
Pt called requesting refill of Synthroid

## 2012-10-19 ENCOUNTER — Ambulatory Visit (INDEPENDENT_AMBULATORY_CARE_PROVIDER_SITE_OTHER): Payer: 59 | Admitting: Internal Medicine

## 2012-10-19 ENCOUNTER — Encounter: Payer: Self-pay | Admitting: Internal Medicine

## 2012-10-19 ENCOUNTER — Other Ambulatory Visit (INDEPENDENT_AMBULATORY_CARE_PROVIDER_SITE_OTHER): Payer: 59

## 2012-10-19 ENCOUNTER — Ambulatory Visit (INDEPENDENT_AMBULATORY_CARE_PROVIDER_SITE_OTHER)
Admission: RE | Admit: 2012-10-19 | Discharge: 2012-10-19 | Disposition: A | Discharge status: Home / Self Care | Payer: 59 | Source: Ambulatory Visit | Attending: Internal Medicine | Admitting: Internal Medicine

## 2012-10-19 VITALS — BP 102/68 | HR 75 | Temp 97.0°F | Resp 16 | Wt 192.0 lb

## 2012-10-19 DIAGNOSIS — E039 Hypothyroidism, unspecified: Secondary | ICD-10-CM

## 2012-10-19 DIAGNOSIS — M25519 Pain in unspecified shoulder: Secondary | ICD-10-CM

## 2012-10-19 DIAGNOSIS — M069 Rheumatoid arthritis, unspecified: Secondary | ICD-10-CM

## 2012-10-19 DIAGNOSIS — E1142 Type 2 diabetes mellitus with diabetic polyneuropathy: Secondary | ICD-10-CM

## 2012-10-19 DIAGNOSIS — IMO0001 Reserved for inherently not codable concepts without codable children: Secondary | ICD-10-CM

## 2012-10-19 DIAGNOSIS — M25511 Pain in right shoulder: Secondary | ICD-10-CM

## 2012-10-19 DIAGNOSIS — E1149 Type 2 diabetes mellitus with other diabetic neurological complication: Secondary | ICD-10-CM

## 2012-10-19 DIAGNOSIS — I10 Essential (primary) hypertension: Secondary | ICD-10-CM

## 2012-10-19 LAB — HEMOGLOBIN A1C: Hgb A1c MFr Bld: 5.7 % (ref 4.6–6.5)

## 2012-10-19 LAB — BASIC METABOLIC PANEL
BUN: 15 mg/dL (ref 6–23)
GFR: 89.76 mL/min (ref >60.00)
Potassium: 4 mEq/L (ref 3.5–5.1)

## 2012-10-19 MED ORDER — IBUPROFEN-FAMOTIDINE 800-26.6 MG PO TABS: 1.0000 | ORAL_TABLET | Freq: Three times a day (TID) | ORAL | Status: DC

## 2012-10-19 NOTE — Patient Instructions (Signed)
Shoulder Pain The shoulder is the joint that connects your arms to your body. The bones that form the shoulder joint include the upper arm bone (humerus), the shoulder blade (scapula), and the collarbone (clavicle). The top of the humerus is shaped like a ball and fits into a rather flat socket on the scapula (glenoid cavity). A combination of muscles and strong, fibrous tissues that connect muscles to bones (tendons) support your shoulder joint and hold the ball in the socket. Small, fluid-filled sacs (bursae) are located in different areas of the joint. They act as cushions between the bones and the overlying soft tissues and help reduce friction between the gliding tendons and the bone as you move your arm. Your shoulder joint allows a wide range of motion in your arm. This range of motion allows you to do things like scratch your back or throw a ball. However, this range of motion also makes your shoulder more prone to pain from overuse and injury. Causes of shoulder pain can originate from both injury and overuse and usually can be grouped in the following four categories:  Redness, swelling, and pain (inflammation) of the tendon (tendinitis) or the bursae (bursitis).  Instability, such as a dislocation of the joint.  Inflammation of the joint (arthritis).  Broken bone (fracture). HOME CARE INSTRUCTIONS   Apply ice to the sore area.  Put ice in a plastic bag.  Place a towel between your skin and the bag.  Leave the ice on for 15 to 20 minutes, 3 to 4 times per day for the first 2 days.  If you have a shoulder sling or immobilizer, wear it as long as your caregiver instructs. Only remove it to shower or bathe. Move your arm as little as possible, but keep your hand moving to prevent swelling.  Only take over-the-counter or prescription medicines for pain, discomfort, or fever as directed by your caregiver. SEEK MEDICAL CARE IF:   Your shoulder pain increases, or new pain develops in  your arm, hand, or fingers.  Your hand or fingers become cold and numb.  Your pain is not relieved with medicines. SEEK IMMEDIATE MEDICAL CARE IF:   Your arm, hand, or fingers are numb or tingling.  Your arm, hand, or fingers are significantly swollen or turn white or blue. MAKE SURE YOU:   Understand these instructions.  Will watch your condition.  Will get help right away if you are not doing well or get worse. Document Released: 08/25/2005 Document Revised: 02/07/2012 Document Reviewed: 10/30/2011 [facility] Patient Information 2013 [place].

## 2012-10-19 NOTE — Progress Notes (Signed)
Subjective:    Patient ID: [name], female    DOB: 1951-07-13, 61 y.o.   MRN: [id number]  Arthritis Presents for initial visit. The disease course has been worsening. She complains of pain and stiffness. She reports no joint swelling or joint warmth. Affected locations include the right shoulder. Her pain is at a severity of 2/10. Pertinent negatives include no diarrhea, dry eyes, dry mouth, dysuria, fatigue, fever, pain at night, pain while resting, rash, Raynaud's syndrome, uveitis or weight loss. Her past medical history is significant for osteoarthritis and rheumatoid arthritis. Her pertinent risk factors include overuse. Treatments tried: tramadol. The treatment provided mild relief. Factors aggravating her arthritis include activity. Compliance with prior treatments has been poor.      Review of Systems  Constitutional: Negative for fever, chills, weight loss, diaphoresis, activity change, appetite change, fatigue and unexpected weight change.  HENT: Negative.   Eyes: Negative.   Respiratory: Negative for cough, chest tightness, shortness of breath, wheezing and stridor.   Cardiovascular: Negative for chest pain, palpitations and leg swelling.  Gastrointestinal: Negative for nausea, vomiting, diarrhea, constipation and anal bleeding.  Genitourinary: Negative.  Negative for dysuria.  Musculoskeletal: Positive for arthralgias, arthritis and stiffness. Negative for myalgias, back pain, joint swelling and gait problem.  Skin: Negative for color change, pallor, rash and wound.  Neurological: Negative for dizziness, tremors, syncope, speech difficulty, weakness, numbness and headaches.  Hematological: Negative for adenopathy. Does not bruise/bleed easily.  Psychiatric/Behavioral: Negative.        Objective:   Physical Exam  Vitals reviewed. Constitutional: She is oriented to person, place, and time. She appears well-developed and well-nourished. No distress.  HENT:  Head:  Normocephalic and atraumatic.  Mouth/Throat: Oropharynx is clear and moist. No oropharyngeal exudate.  Eyes: Conjunctivae normal are normal. Right eye exhibits no discharge. Left eye exhibits no discharge. No scleral icterus.  Neck: Normal range of motion. Neck supple. No JVD present. No tracheal deviation present. No thyromegaly present.  Cardiovascular: Normal rate, regular rhythm, normal heart sounds and intact distal pulses.  Exam reveals no gallop and no friction rub.   No murmur heard. Pulmonary/Chest: Effort normal and breath sounds normal. No stridor. No respiratory distress. She has no wheezes. She has no rales. She exhibits no tenderness.  Abdominal: Soft. Bowel sounds are normal. She exhibits no distension and no mass. There is no tenderness. There is no rebound and no guarding.  Musculoskeletal: She exhibits no edema and no tenderness.       Right shoulder: She exhibits decreased range of motion. She exhibits no tenderness, no bony tenderness, no swelling, no effusion, no crepitus, no deformity, no laceration, no pain, no spasm, normal pulse and normal strength.  Lymphadenopathy:    She has no cervical adenopathy.  Neurological: She is oriented to person, place, and time.  Skin: Skin is warm and dry. No rash noted. She is not diaphoretic. No erythema. No pallor.  Psychiatric: She has a normal mood and affect. Her behavior is normal. Judgment and thought content normal.     Lab Results  Component Value Date   WBC 8.4 07/14/2012   HGB 12.4 07/14/2012   HCT 37.8 07/14/2012   PLT 314.0 07/14/2012   GLUCOSE 118* 06/14/2012   CHOL 185 06/14/2012   TRIG 215.0* 06/14/2012   HDL 43.10 06/14/2012   LDLDIRECT 103.1 06/14/2012   LDLCALC 64 10/20/2009   ALT 16 06/14/2012   AST 16 06/14/2012   NA 139 06/14/2012   K 3.7 06/14/2012  CL 103 06/14/2012   CREATININE 0.8 06/14/2012   BUN 16 06/14/2012   CO2 30 06/14/2012   TSH 0.77 06/14/2012   INR 1.0 07/23/2009   HGBA1C 6.1 06/14/2012         Assessment & Plan:

## 2012-10-20 NOTE — Assessment & Plan Note (Signed)
Her BP is well controlled, I will check her lytes and renal function 

## 2012-10-20 NOTE — Assessment & Plan Note (Signed)
Plain film of the shoulder shows mild DJD She will try Duexis If she does not improve then I will consider getting an MRI done (possible rotator cuff tendonitis) or ortho referral

## 2012-10-20 NOTE — Assessment & Plan Note (Signed)
I will check her TSH and will adjust the dose if needed

## 2012-10-20 NOTE — Assessment & Plan Note (Signed)
She will try Duexis

## 2012-10-20 NOTE — Assessment & Plan Note (Signed)
Her recent a1c was low so I have asked her to stop tradjenta

## 2012-11-16 ENCOUNTER — Ambulatory Visit: Payer: 59 | Admitting: Internal Medicine

## 2012-11-17 ENCOUNTER — Encounter: Payer: Self-pay | Admitting: Internal Medicine

## 2012-11-17 ENCOUNTER — Ambulatory Visit (INDEPENDENT_AMBULATORY_CARE_PROVIDER_SITE_OTHER): Payer: 59 | Admitting: Internal Medicine

## 2012-11-17 VITALS — BP 112/70 | HR 74 | Temp 98.2°F | Resp 16 | Wt 195.0 lb

## 2012-11-17 DIAGNOSIS — E039 Hypothyroidism, unspecified: Secondary | ICD-10-CM

## 2012-11-17 DIAGNOSIS — I10 Essential (primary) hypertension: Secondary | ICD-10-CM

## 2012-11-17 NOTE — Assessment & Plan Note (Signed)
Her BP is well controlled 

## 2012-11-17 NOTE — Progress Notes (Signed)
  Subjective:    Patient ID: [name], female    DOB: 13-Mar-1951, 61 y.o.   MRN: [id number]  Hypertension This is a chronic problem. The current episode started more than 1 year ago. The problem has been gradually improving since onset. The problem is controlled. Pertinent negatives include no anxiety, blurred vision, chest pain, headaches, malaise/fatigue, neck pain, orthopnea, palpitations, peripheral edema, PND, shortness of breath or sweats. Past treatments include angiotensin blockers and diuretics.      Review of Systems  Constitutional: Negative for fever, chills, malaise/fatigue, diaphoresis, activity change, appetite change, fatigue and unexpected weight change.  HENT: Negative.  Negative for neck pain.   Eyes: Negative.  Negative for blurred vision.  Respiratory: Negative for cough, chest tightness, shortness of breath, wheezing and stridor.   Cardiovascular: Negative for chest pain, palpitations, orthopnea, leg swelling and PND.  Gastrointestinal: Negative for nausea, vomiting, abdominal pain, diarrhea, constipation and blood in stool.  Genitourinary: Negative.   Musculoskeletal: Positive for back pain and arthralgias (shoulders and knees). Negative for myalgias, joint swelling and gait problem.  Skin: Negative.   Neurological: Negative for dizziness, tremors, seizures, syncope, facial asymmetry, speech difficulty, weakness, light-headedness, numbness and headaches.  Hematological: Negative for adenopathy. Does not bruise/bleed easily.  Psychiatric/Behavioral: Negative.        Objective:   Physical Exam  Vitals reviewed. Constitutional: She is oriented to person, place, and time. She appears well-developed and well-nourished. No distress.  HENT:  Head: Normocephalic and atraumatic.  Mouth/Throat: Oropharynx is clear and moist. No oropharyngeal exudate.  Eyes: Conjunctivae normal are normal. Right eye exhibits no discharge. Left eye exhibits no discharge. No scleral  icterus.  Neck: Normal range of motion. Neck supple. No JVD present. No tracheal deviation present. No thyromegaly present.  Cardiovascular: Normal rate, regular rhythm, normal heart sounds and intact distal pulses.  Exam reveals no gallop and no friction rub.   No murmur heard. Pulmonary/Chest: Effort normal and breath sounds normal. No stridor. No respiratory distress. She has no wheezes. She has no rales. She exhibits no tenderness.  Abdominal: Soft. Bowel sounds are normal. She exhibits no distension and no mass. There is no tenderness. There is no rebound and no guarding.  Musculoskeletal: Normal range of motion. She exhibits no edema.  Lymphadenopathy:    She has no cervical adenopathy.  Neurological: She is oriented to person, place, and time.  Skin: Skin is warm and dry. No rash noted. She is not diaphoretic. No erythema. No pallor.  Psychiatric: She has a normal mood and affect. Her behavior is normal. Judgment and thought content normal.     Lab Results  Component Value Date   WBC 8.4 07/14/2012   HGB 12.4 07/14/2012   HCT 37.8 07/14/2012   PLT 314.0 07/14/2012   GLUCOSE 104* 10/19/2012   CHOL 185 06/14/2012   TRIG 215.0* 06/14/2012   HDL 43.10 06/14/2012   LDLDIRECT 103.1 06/14/2012   LDLCALC 64 10/20/2009   ALT 16 06/14/2012   AST 16 06/14/2012   NA 135 10/19/2012   K 4.0 10/19/2012   CL 99 10/19/2012   CREATININE 0.8 10/19/2012   BUN 15 10/19/2012   CO2 30 10/19/2012   TSH 2.87 10/19/2012   INR 1.0 07/23/2009   HGBA1C 5.7 10/19/2012       Assessment & Plan:

## 2012-11-17 NOTE — Assessment & Plan Note (Signed)
Her recent TSH was normal

## 2012-11-17 NOTE — Patient Instructions (Signed)

## 2013-03-20 ENCOUNTER — Encounter: Payer: Self-pay | Admitting: Internal Medicine

## 2013-03-20 ENCOUNTER — Other Ambulatory Visit (INDEPENDENT_AMBULATORY_CARE_PROVIDER_SITE_OTHER): Payer: 59

## 2013-03-20 ENCOUNTER — Ambulatory Visit (INDEPENDENT_AMBULATORY_CARE_PROVIDER_SITE_OTHER): Payer: 59 | Admitting: Internal Medicine

## 2013-03-20 ENCOUNTER — Ambulatory Visit: Payer: 59 | Admitting: Internal Medicine

## 2013-03-20 VITALS — BP 132/84 | HR 67 | Temp 97.6°F | Resp 16 | Ht 67.0 in | Wt 191.8 lb

## 2013-03-20 DIAGNOSIS — E039 Hypothyroidism, unspecified: Secondary | ICD-10-CM

## 2013-03-20 DIAGNOSIS — E1149 Type 2 diabetes mellitus with other diabetic neurological complication: Secondary | ICD-10-CM

## 2013-03-20 DIAGNOSIS — IMO0002 Reserved for concepts with insufficient information to code with codable children: Secondary | ICD-10-CM

## 2013-03-20 DIAGNOSIS — I10 Essential (primary) hypertension: Secondary | ICD-10-CM

## 2013-03-20 DIAGNOSIS — E1142 Type 2 diabetes mellitus with diabetic polyneuropathy: Secondary | ICD-10-CM

## 2013-03-20 DIAGNOSIS — E781 Pure hyperglyceridemia: Secondary | ICD-10-CM

## 2013-03-20 DIAGNOSIS — E785 Hyperlipidemia, unspecified: Secondary | ICD-10-CM

## 2013-03-20 DIAGNOSIS — M1710 Unilateral primary osteoarthritis, unspecified knee: Secondary | ICD-10-CM

## 2013-03-20 DIAGNOSIS — E876 Hypokalemia: Secondary | ICD-10-CM

## 2013-03-20 LAB — COMPREHENSIVE METABOLIC PANEL
ALT: 11 U/L (ref 0–35)
AST: 17 U/L (ref 0–37)
Alkaline Phosphatase: 86 U/L (ref 39–117)
CO2: 31 mEq/L (ref 19–32)
Sodium: 138 mEq/L (ref 135–145)
Total Bilirubin: 0.8 mg/dL (ref 0.3–1.2)
Total Protein: 7.4 g/dL (ref 6.0–8.3)

## 2013-03-20 LAB — LIPID PANEL
HDL: 34 mg/dL — ABNORMAL LOW (ref >39.00)
Total CHOL/HDL Ratio: 6
Triglycerides: 231 mg/dL — ABNORMAL HIGH (ref 0.0–149.0)
VLDL: 46.2 mg/dL — ABNORMAL HIGH (ref 0.0–40.0)

## 2013-03-20 MED ORDER — POTASSIUM CHLORIDE CRYS ER 20 MEQ PO TBCR: 20.0000 meq | EXTENDED_RELEASE_TABLET | Freq: Two times a day (BID) | ORAL | Status: DC

## 2013-03-20 NOTE — Assessment & Plan Note (Signed)
FLP today 

## 2013-03-20 NOTE — Assessment & Plan Note (Signed)
Her BP is well controlled Today I will check her lytes and renal function 

## 2013-03-20 NOTE — Progress Notes (Signed)
Subjective:    Patient ID: [name], female    DOB: 05-31-1951, 62 y.o.   MRN: [id number]  Hypertension This is a chronic problem. The current episode started more than 1 year ago. The problem has been gradually improving since onset. The problem is controlled. Pertinent negatives include no anxiety, blurred vision, chest pain, headaches, malaise/fatigue, neck pain, orthopnea, palpitations, peripheral edema, PND, shortness of breath or sweats. The current treatment provides significant improvement. There are no compliance problems.       Review of Systems  Constitutional: Positive for fatigue. Negative for fever, chills, malaise/fatigue, diaphoresis, activity change, appetite change and unexpected weight change.  HENT: Negative.  Negative for neck pain.   Eyes: Negative.  Negative for blurred vision.  Respiratory: Negative.  Negative for apnea, cough, choking, chest tightness, shortness of breath, wheezing and stridor.   Cardiovascular: Negative.  Negative for chest pain, palpitations, orthopnea, leg swelling and PND.  Gastrointestinal: Negative.  Negative for nausea, vomiting, abdominal pain, diarrhea and constipation.  Endocrine: Negative.  Negative for polydipsia and polyphagia.  Genitourinary: Negative.   Musculoskeletal: Negative.  Negative for myalgias, back pain, joint swelling and gait problem.  Skin: Negative for color change, pallor, rash and wound.  Allergic/Immunologic: Negative.   Neurological: Negative.  Negative for dizziness and headaches.  Hematological: Negative.  Negative for adenopathy. Does not bruise/bleed easily.  Psychiatric/Behavioral: Negative.        Objective:   Physical Exam  Vitals reviewed. Constitutional: She is oriented to person, place, and time. She appears well-developed and well-nourished. No distress.  HENT:  Head: Normocephalic and atraumatic.  Mouth/Throat: Oropharynx is clear and moist. No oropharyngeal exudate.  Eyes: Conjunctivae are  normal. Right eye exhibits no discharge. Left eye exhibits no discharge. No scleral icterus.  Neck: Normal range of motion. Neck supple. No JVD present. No tracheal deviation present. No thyromegaly present.  Cardiovascular: Normal rate, regular rhythm, normal heart sounds and intact distal pulses.  Exam reveals no gallop and no friction rub.   No murmur heard. Pulmonary/Chest: Effort normal and breath sounds normal. No stridor. No respiratory distress. She has no wheezes. She has no rales. She exhibits no tenderness.  Abdominal: Soft. Bowel sounds are normal. She exhibits no distension and no mass. There is no tenderness. There is no rebound and no guarding.  Musculoskeletal: Normal range of motion. She exhibits no edema and no tenderness.       Right knee: Normal.       Left knee: Normal.  Lymphadenopathy:    She has no cervical adenopathy.  Neurological: She is oriented to person, place, and time.  Skin: Skin is warm and dry. No rash noted. She is not diaphoretic. No erythema. No pallor.  Psychiatric: She has a normal mood and affect. Her behavior is normal. Judgment and thought content normal.     Lab Results  Component Value Date   WBC 8.4 07/14/2012   HGB 12.4 07/14/2012   HCT 37.8 07/14/2012   PLT 314.0 07/14/2012   GLUCOSE 104* 10/19/2012   CHOL 185 06/14/2012   TRIG 215.0* 06/14/2012   HDL 43.10 06/14/2012   LDLDIRECT 103.1 06/14/2012   LDLCALC 64 10/20/2009   ALT 16 06/14/2012   AST 16 06/14/2012   NA 135 10/19/2012   K 4.0 10/19/2012   CL 99 10/19/2012   CREATININE 0.8 10/19/2012   BUN 15 10/19/2012   CO2 30 10/19/2012   TSH 2.87 10/19/2012   INR 1.0 07/23/2009   HGBA1C 5.7 10/19/2012  Assessment & Plan:

## 2013-03-20 NOTE — Assessment & Plan Note (Signed)
Her K+ is slightly low so I have asked her to restart K+ replacement therapy

## 2013-03-20 NOTE — Assessment & Plan Note (Signed)
I will check her A1C and will address if needed

## 2013-03-20 NOTE — Assessment & Plan Note (Signed)
She is doing well with tylenol

## 2013-03-20 NOTE — Patient Instructions (Addendum)

## 2013-06-23 ENCOUNTER — Other Ambulatory Visit: Payer: Self-pay | Admitting: Internal Medicine

## 2013-07-02 ENCOUNTER — Other Ambulatory Visit: Payer: Self-pay | Admitting: Internal Medicine

## 2013-07-02 DIAGNOSIS — Z1231 Encounter for screening mammogram for malignant neoplasm of breast: Secondary | ICD-10-CM

## 2013-07-20 ENCOUNTER — Encounter: Payer: Self-pay | Admitting: Internal Medicine

## 2013-07-20 ENCOUNTER — Other Ambulatory Visit (INDEPENDENT_AMBULATORY_CARE_PROVIDER_SITE_OTHER): Payer: 59

## 2013-07-20 ENCOUNTER — Ambulatory Visit (INDEPENDENT_AMBULATORY_CARE_PROVIDER_SITE_OTHER): Payer: 59 | Admitting: Internal Medicine

## 2013-07-20 VITALS — BP 110/72 | HR 76 | Temp 98.2°F | Resp 16 | Wt 191.0 lb

## 2013-07-20 DIAGNOSIS — M8580 Other specified disorders of bone density and structure, unspecified site: Secondary | ICD-10-CM | POA: Insufficient documentation

## 2013-07-20 DIAGNOSIS — M899 Disorder of bone, unspecified: Secondary | ICD-10-CM

## 2013-07-20 DIAGNOSIS — R52 Pain, unspecified: Secondary | ICD-10-CM

## 2013-07-20 DIAGNOSIS — R1084 Generalized abdominal pain: Secondary | ICD-10-CM | POA: Insufficient documentation

## 2013-07-20 DIAGNOSIS — Z23 Encounter for immunization: Secondary | ICD-10-CM

## 2013-07-20 DIAGNOSIS — E785 Hyperlipidemia, unspecified: Secondary | ICD-10-CM

## 2013-07-20 DIAGNOSIS — I10 Essential (primary) hypertension: Secondary | ICD-10-CM

## 2013-07-20 DIAGNOSIS — E876 Hypokalemia: Secondary | ICD-10-CM

## 2013-07-20 DIAGNOSIS — E039 Hypothyroidism, unspecified: Secondary | ICD-10-CM

## 2013-07-20 LAB — CBC WITH DIFFERENTIAL/PLATELET
Eosinophils Relative: 1.7 % (ref 0.0–5.0)
HCT: 38.8 % (ref 36.0–46.0)
Hemoglobin: 13 g/dL (ref 12.0–15.0)
Lymphs Abs: 2.8 10*3/uL (ref 0.7–4.0)
Monocytes Relative: 5.1 % (ref 3.0–12.0)
Neutro Abs: 5.2 10*3/uL (ref 1.4–7.7)
RDW: 14.4 % (ref 11.5–14.6)
WBC: 8.6 10*3/uL (ref 4.5–10.5)

## 2013-07-20 LAB — AMYLASE: Amylase: 87 U/L (ref 27–131)

## 2013-07-20 LAB — URINALYSIS, ROUTINE W REFLEX MICROSCOPIC
Bilirubin Urine: NEGATIVE
Hgb urine dipstick: NEGATIVE
Nitrite: NEGATIVE
Total Protein, Urine: NEGATIVE
Urobilinogen, UA: 0.2 (ref 0.0–1.0)

## 2013-07-20 LAB — COMPREHENSIVE METABOLIC PANEL
CO2: 31 mEq/L (ref 19–32)
Creatinine, Ser: 0.8 mg/dL (ref 0.4–1.2)
GFR: 88.31 mL/min (ref >60.00)
Glucose, Bld: 102 mg/dL — ABNORMAL HIGH (ref 70–99)
Total Bilirubin: 0.7 mg/dL (ref 0.3–1.2)

## 2013-07-20 LAB — MAGNESIUM: Magnesium: 1.9 mg/dL (ref 1.5–2.5)

## 2013-07-20 MED ORDER — POTASSIUM CHLORIDE CRYS ER 20 MEQ PO TBCR: 20.0000 meq | EXTENDED_RELEASE_TABLET | Freq: Three times a day (TID) | ORAL | Status: DC

## 2013-07-20 NOTE — Patient Instructions (Signed)
Abdominal Pain Abdominal pain can be caused by many things. Your caregiver decides the seriousness of your pain by an examination and possibly blood tests and X-rays. Many cases can be observed and treated at home. Most abdominal pain is not caused by a disease and will probably improve without treatment. However, in many cases, more time must pass before a clear cause of the pain can be found. Before that point, it may not be known if you need more testing, or if hospitalization or surgery is needed. HOME CARE INSTRUCTIONS   Do not take laxatives unless directed by your caregiver.  Take pain medicine only as directed by your caregiver.  Only take over-the-counter or prescription medicines for pain, discomfort, or fever as directed by your caregiver.  Try a clear liquid diet (broth, tea, or water) for as long as directed by your caregiver. Slowly move to a bland diet as tolerated. SEEK IMMEDIATE MEDICAL CARE IF:   The pain does not go away.  You have a fever.  You keep throwing up (vomiting).  The pain is felt only in portions of the abdomen. Pain in the right side could possibly be appendicitis. In an adult, pain in the left lower portion of the abdomen could be colitis or diverticulitis.  You pass bloody or black tarry stools. MAKE SURE YOU:   Understand these instructions.  Will watch your condition.  Will get help right away if you are not doing well or get worse. Document Released: 08/25/2005 Document Revised: 02/07/2012 Document Reviewed: 07/03/2008 [facility] Patient Information 2014 [place].

## 2013-07-20 NOTE — Assessment & Plan Note (Signed)
I will check her TSH level and will adjust her dose if needed

## 2013-07-20 NOTE — Progress Notes (Signed)
Subjective:    Patient ID: [name], female    DOB: 17-Jun-1951, 62 y.o.   MRN: [id number]  Abdominal Pain This is a new problem. The current episode started 1 to 4 weeks ago. The onset quality is gradual. The problem occurs intermittently. The most recent episode lasted 3 weeks. The problem has been unchanged. The pain is located in the suprapubic region. The pain is at a severity of 2/10. The pain is mild. The quality of the pain is dull. The abdominal pain does not radiate. Associated symptoms include constipation, diarrhea and frequency. Pertinent negatives include no anorexia, arthralgias, belching, dysuria, fever, flatus, headaches, hematochezia, hematuria, melena, myalgias, nausea, vomiting or weight loss. Nothing aggravates the pain. The pain is relieved by nothing. She has tried nothing for the symptoms. Her past medical history is significant for abdominal surgery and gallstones. There is no history of colon cancer, Crohn's disease, GERD, irritable bowel syndrome, pancreatitis, PUD or ulcerative colitis.      Review of Systems  Constitutional: Negative.  Negative for fever, chills, weight loss, diaphoresis, activity change, appetite change, fatigue and unexpected weight change.  HENT: Negative.   Eyes: Negative.   Respiratory: Negative for apnea, cough, chest tightness, shortness of breath, wheezing and stridor.   Cardiovascular: Negative.  Negative for chest pain, palpitations and leg swelling.  Gastrointestinal: Positive for abdominal pain, diarrhea and constipation. Negative for nausea, vomiting, melena, hematochezia, abdominal distention, anal bleeding, rectal pain, anorexia and flatus.  Endocrine: Negative.   Genitourinary: Positive for frequency. Negative for dysuria and hematuria.  Musculoskeletal: Negative.  Negative for myalgias, back pain, joint swelling, arthralgias and gait problem.  Skin: Negative.   Allergic/Immunologic: Negative.   Neurological: Negative.  Negative  for dizziness and headaches.  Hematological: Negative.  Negative for adenopathy. Does not bruise/bleed easily.  Psychiatric/Behavioral: Negative.        Objective:   Physical Exam  Vitals reviewed. Constitutional: She is oriented to person, place, and time. She appears well-developed and well-nourished.  Non-toxic appearance. She does not have a sickly appearance. She does not appear ill. No distress.  HENT:  Head: Normocephalic and atraumatic.  Mouth/Throat: Oropharynx is clear and moist. No oropharyngeal exudate.  Eyes: Conjunctivae are normal. Right eye exhibits no discharge. Left eye exhibits no discharge. No scleral icterus.  Neck: Normal range of motion. Neck supple. No JVD present. No tracheal deviation present. No thyromegaly present.  Cardiovascular: Normal rate, regular rhythm, normal heart sounds and intact distal pulses.  Exam reveals no gallop and no friction rub.   No murmur heard. Pulmonary/Chest: Effort normal and breath sounds normal. No stridor. No respiratory distress. She has no wheezes. She has no rales. She exhibits no tenderness.  Abdominal: Soft. Normal appearance and bowel sounds are normal. She exhibits no distension, no abdominal bruit, no pulsatile midline mass and no mass. There is no hepatosplenomegaly, splenomegaly or hepatomegaly. There is no tenderness. There is no rebound, no guarding and no CVA tenderness. No hernia. Hernia confirmed negative in the ventral area, confirmed negative in the right inguinal area and confirmed negative in the left inguinal area.  Musculoskeletal: Normal range of motion. She exhibits no edema and no tenderness.  Lymphadenopathy:    She has no cervical adenopathy.  Neurological: She is oriented to person, place, and time.  Skin: Skin is warm and dry. No rash noted. She is not diaphoretic. No erythema. No pallor.  Psychiatric: She has a normal mood and affect. Her behavior is normal. Judgment and thought content  normal.     Lab  Results  Component Value Date   WBC 8.4 07/14/2012   HGB 12.4 07/14/2012   HCT 37.8 07/14/2012   PLT 314.0 07/14/2012   GLUCOSE 99 03/20/2013   CHOL 207* 03/20/2013   TRIG 231.0* 03/20/2013   HDL 34.00* 03/20/2013   LDLDIRECT 130.0 03/20/2013   LDLCALC 64 10/20/2009   ALT 11 03/20/2013   AST 17 03/20/2013   NA 138 03/20/2013   K 3.3* 03/20/2013   CL 101 03/20/2013   CREATININE 0.7 03/20/2013   BUN 11 03/20/2013   CO2 31 03/20/2013   TSH 5.24 03/20/2013   INR 1.0 07/23/2009   HGBA1C 6.2 03/20/2013       Assessment & Plan:

## 2013-07-20 NOTE — Assessment & Plan Note (Addendum)
Her symptoms are vague and her exam is normal, I do not see an acute process I will check her urine to look for blood (stones), infection, protein etc - will also check her labs to look for signs of infection, hepatitis, pancreatitis, renal disease, etc. She has some diarrhea alternating with constipation so I am will consider IBS as the cause her her pain pending the results from today.

## 2013-07-20 NOTE — Assessment & Plan Note (Signed)
Her K+ level remains low so I have asked her to increase her K+ replacement therapy and to be more complaint with it

## 2013-08-01 ENCOUNTER — Other Ambulatory Visit: Payer: 59

## 2013-08-10 ENCOUNTER — Ambulatory Visit (HOSPITAL_COMMUNITY)
Admission: RE | Admit: 2013-08-10 | Discharge: 2013-08-10 | Disposition: A | Discharge status: Home / Self Care | Payer: 59 | Source: Ambulatory Visit | Attending: Internal Medicine | Admitting: Internal Medicine

## 2013-08-10 ENCOUNTER — Ambulatory Visit (INDEPENDENT_AMBULATORY_CARE_PROVIDER_SITE_OTHER): Payer: 59 | Admitting: Internal Medicine

## 2013-08-10 ENCOUNTER — Other Ambulatory Visit (INDEPENDENT_AMBULATORY_CARE_PROVIDER_SITE_OTHER): Payer: 59

## 2013-08-10 ENCOUNTER — Encounter: Payer: Self-pay | Admitting: Internal Medicine

## 2013-08-10 VITALS — BP 108/64 | HR 74 | Temp 97.2°F | Resp 16 | Wt 194.0 lb

## 2013-08-10 DIAGNOSIS — E785 Hyperlipidemia, unspecified: Secondary | ICD-10-CM

## 2013-08-10 DIAGNOSIS — E876 Hypokalemia: Secondary | ICD-10-CM

## 2013-08-10 DIAGNOSIS — Z1231 Encounter for screening mammogram for malignant neoplasm of breast: Secondary | ICD-10-CM | POA: Insufficient documentation

## 2013-08-10 DIAGNOSIS — I10 Essential (primary) hypertension: Secondary | ICD-10-CM

## 2013-08-10 DIAGNOSIS — Z23 Encounter for immunization: Secondary | ICD-10-CM

## 2013-08-10 LAB — BASIC METABOLIC PANEL
CO2: 30 mEq/L (ref 19–32)
Calcium: 9.6 mg/dL (ref 8.4–10.5)
Creatinine, Ser: 0.7 mg/dL (ref 0.4–1.2)

## 2013-08-10 MED ORDER — OLMESARTAN MEDOXOMIL 20 MG PO TABS: 20.0000 mg | ORAL_TABLET | Freq: Every day | ORAL | Status: DC

## 2013-08-10 MED ORDER — PRAVASTATIN SODIUM 20 MG PO TABS: 20.0000 mg | ORAL_TABLET | Freq: Every day | ORAL | Status: DC

## 2013-08-10 NOTE — Assessment & Plan Note (Signed)
She is willing to start pravachol

## 2013-08-10 NOTE — Assessment & Plan Note (Signed)
She has restarted the K+ replacement therapy I have asked her to stop taking HCTZ Will recheck her BMP today

## 2013-08-10 NOTE — Progress Notes (Signed)
  Subjective:    Patient ID: [name], female    DOB: Oct 10, 1951, 62 y.o.   MRN: [id number]  HPI Comments: She returns for a recheck on her K+ level, she admits that she had not been taking the K+ replacement. She feels well and offers no complaints.     Review of Systems  Constitutional: Negative.   HENT: Negative.   Eyes: Negative.   Respiratory: Negative for cough, chest tightness, shortness of breath, wheezing and stridor.   Cardiovascular: Negative.  Negative for chest pain, palpitations and leg swelling.  Gastrointestinal: Negative for nausea, vomiting, abdominal pain, diarrhea and constipation.  Endocrine: Negative.   Genitourinary: Negative.   Musculoskeletal: Negative.  Negative for myalgias, back pain, joint swelling and gait problem.  Skin: Negative.   Allergic/Immunologic: Negative.   Neurological: Negative for dizziness, tremors, syncope, weakness and light-headedness.  Hematological: Negative.  Negative for adenopathy. Does not bruise/bleed easily.  Psychiatric/Behavioral: Negative.   All other systems reviewed and are negative.       Objective:   Physical Exam  Vitals reviewed. Constitutional: She is oriented to person, place, and time. She appears well-developed and well-nourished. No distress.  HENT:  Head: Normocephalic and atraumatic.  Mouth/Throat: Oropharynx is clear and moist. No oropharyngeal exudate.  Eyes: Conjunctivae are normal. Right eye exhibits no discharge. Left eye exhibits no discharge. No scleral icterus.  Neck: Normal range of motion. Neck supple. No JVD present. No tracheal deviation present. No thyromegaly present.  Cardiovascular: Normal rate, regular rhythm, normal heart sounds and intact distal pulses.  Exam reveals no gallop and no friction rub.   No murmur heard. Pulmonary/Chest: Effort normal and breath sounds normal. No stridor. No respiratory distress. She has no wheezes. She has no rales. She exhibits no tenderness.  Abdominal:  Soft. Bowel sounds are normal. She exhibits no distension and no mass. There is no tenderness. There is no rebound and no guarding.  Musculoskeletal: Normal range of motion. She exhibits no edema and no tenderness.  Lymphadenopathy:    She has no cervical adenopathy.  Neurological: She is oriented to person, place, and time.  Skin: Skin is warm and dry. No rash noted. She is not diaphoretic. No erythema. No pallor.     Lab Results  Component Value Date   WBC 8.6 07/20/2013   HGB 13.0 07/20/2013   HCT 38.8 07/20/2013   PLT 315.0 07/20/2013   GLUCOSE 102* 07/20/2013   CHOL 207* 03/20/2013   TRIG 231.0* 03/20/2013   HDL 34.00* 03/20/2013   LDLDIRECT 130.0 03/20/2013   LDLCALC 64 10/20/2009   ALT 13 07/20/2013   AST 16 07/20/2013   NA 141 07/20/2013   K 3.0* 07/20/2013   CL 100 07/20/2013   CREATININE 0.8 07/20/2013   BUN 12 07/20/2013   CO2 31 07/20/2013   TSH 3.09 07/20/2013   INR 1.0 07/23/2009   HGBA1C 6.2 03/20/2013       Assessment & Plan:

## 2013-08-10 NOTE — Assessment & Plan Note (Signed)
Her BP is low and she has low K+ so I have asked her to stop the HCTZ but she will continue with Benicar

## 2013-08-10 NOTE — Patient Instructions (Signed)

## 2013-08-13 ENCOUNTER — Other Ambulatory Visit: Payer: Self-pay | Admitting: Internal Medicine

## 2013-08-13 ENCOUNTER — Ambulatory Visit
Admission: RE | Admit: 2013-08-13 | Discharge: 2013-08-13 | Disposition: A | Discharge status: Home / Self Care | Payer: 59 | Source: Ambulatory Visit | Attending: Internal Medicine | Admitting: Internal Medicine

## 2013-08-13 DIAGNOSIS — M8580 Other specified disorders of bone density and structure, unspecified site: Secondary | ICD-10-CM

## 2013-10-22 ENCOUNTER — Other Ambulatory Visit: Payer: 59

## 2013-11-06 ENCOUNTER — Ambulatory Visit (INDEPENDENT_AMBULATORY_CARE_PROVIDER_SITE_OTHER): Payer: 59 | Admitting: Internal Medicine

## 2013-11-06 ENCOUNTER — Other Ambulatory Visit (INDEPENDENT_AMBULATORY_CARE_PROVIDER_SITE_OTHER): Payer: 59

## 2013-11-06 ENCOUNTER — Encounter: Payer: Self-pay | Admitting: Internal Medicine

## 2013-11-06 VITALS — BP 130/88 | HR 60 | Temp 98.1°F | Resp 16 | Ht 67.0 in | Wt 189.0 lb

## 2013-11-06 DIAGNOSIS — E785 Hyperlipidemia, unspecified: Secondary | ICD-10-CM

## 2013-11-06 DIAGNOSIS — I10 Essential (primary) hypertension: Secondary | ICD-10-CM

## 2013-11-06 DIAGNOSIS — E876 Hypokalemia: Secondary | ICD-10-CM

## 2013-11-06 DIAGNOSIS — Z23 Encounter for immunization: Secondary | ICD-10-CM

## 2013-11-06 DIAGNOSIS — E039 Hypothyroidism, unspecified: Secondary | ICD-10-CM

## 2013-11-06 DIAGNOSIS — IMO0001 Reserved for inherently not codable concepts without codable children: Secondary | ICD-10-CM

## 2013-11-06 DIAGNOSIS — Z2911 Encounter for prophylactic immunotherapy for respiratory syncytial virus (RSV): Secondary | ICD-10-CM

## 2013-11-06 LAB — COMPREHENSIVE METABOLIC PANEL
ALT: 13 U/L (ref 0–35)
AST: 16 U/L (ref 0–37)
Albumin: 3.7 g/dL (ref 3.5–5.2)
BUN: 12 mg/dL (ref 6–23)
CO2: 28 mEq/L (ref 19–32)
Creatinine, Ser: 0.7 mg/dL (ref 0.4–1.2)
GFR: 105.4 mL/min (ref >60.00)
Sodium: 137 mEq/L (ref 135–145)
Total Bilirubin: 0.9 mg/dL (ref 0.3–1.2)
Total Protein: 7.4 g/dL (ref 6.0–8.3)

## 2013-11-06 LAB — LIPID PANEL
Cholesterol: 149 mg/dL (ref 0–200)
HDL: 38 mg/dL — ABNORMAL LOW (ref >39.00)
LDL Cholesterol: 85 mg/dL (ref 0–99)
Total CHOL/HDL Ratio: 4
Triglycerides: 131 mg/dL (ref 0.0–149.0)

## 2013-11-06 LAB — TSH: TSH: 1.68 u[IU]/mL (ref 0.35–5.50)

## 2013-11-06 LAB — CK: Total CK: 85 U/L (ref 7–177)

## 2013-11-06 NOTE — Progress Notes (Signed)
   Subjective:    Patient ID: [name], female    DOB: October 01, 1951, 62 y.o.   MRN: [id number]  Thyroid Problem Presents for follow-up visit. Symptoms include constipation and fatigue. Patient reports no anxiety, cold intolerance, depressed mood, diaphoresis, diarrhea, dry skin, hair loss, heat intolerance, hoarse voice, leg swelling, nail problem, palpitations, tremors, visual change, weight gain or weight loss. The symptoms have been stable. Past treatments include levothyroxine. The treatment provided moderate relief.      Review of Systems  Constitutional: Positive for fatigue. Negative for fever, chills, weight loss, weight gain, diaphoresis, activity change, appetite change and unexpected weight change.  HENT: Negative.  Negative for hoarse voice.   Eyes: Negative.   Respiratory: Negative.  Negative for cough, chest tightness, shortness of breath, wheezing and stridor.   Cardiovascular: Negative.  Negative for chest pain, palpitations and leg swelling.  Gastrointestinal: Positive for constipation. Negative for nausea, vomiting, abdominal pain, diarrhea, blood in stool and rectal pain.  Endocrine: Negative.  Negative for cold intolerance and heat intolerance.  Genitourinary: Negative.   Musculoskeletal: Positive for arthralgias and myalgias. Negative for back pain, gait problem, joint swelling, neck pain and neck stiffness.  Skin: Negative.   Allergic/Immunologic: Negative.   Neurological: Negative.  Negative for dizziness, tremors, weakness, light-headedness and numbness.  Hematological: Negative.  Negative for adenopathy. Does not bruise/bleed easily.  Psychiatric/Behavioral: Negative.        Objective:   Physical Exam  Vitals reviewed. Constitutional: She is oriented to person, place, and time. She appears well-developed and well-nourished. No distress.  HENT:  Head: Normocephalic and atraumatic.  Mouth/Throat: Oropharynx is clear and moist. No oropharyngeal exudate.    Eyes: Conjunctivae are normal. Right eye exhibits no discharge. Left eye exhibits no discharge. No scleral icterus.  Neck: Normal range of motion. Neck supple. No JVD present. No tracheal deviation present. No thyromegaly present.  Cardiovascular: Normal rate, regular rhythm, normal heart sounds and intact distal pulses.  Exam reveals no gallop and no friction rub.   No murmur heard. Pulmonary/Chest: Effort normal and breath sounds normal. No stridor. No respiratory distress. She has no wheezes. She has no rales. She exhibits no tenderness.  Abdominal: Soft. Bowel sounds are normal. She exhibits no distension and no mass. There is no tenderness. There is no rebound and no guarding.  Musculoskeletal: Normal range of motion. She exhibits no edema and no tenderness.  Lymphadenopathy:    She has no cervical adenopathy.  Neurological: She is oriented to person, place, and time.  Skin: Skin is warm and dry. No rash noted. She is not diaphoretic. No erythema. No pallor.  Psychiatric: She has a normal mood and affect. Her behavior is normal. Judgment and thought content normal.     Lab Results  Component Value Date   WBC 8.6 07/20/2013   HGB 13.0 07/20/2013   HCT 38.8 07/20/2013   PLT 315.0 07/20/2013   GLUCOSE 96 08/10/2013   CHOL 207* 03/20/2013   TRIG 231.0* 03/20/2013   HDL 34.00* 03/20/2013   LDLDIRECT 130.0 03/20/2013   LDLCALC 64 10/20/2009   ALT 13 07/20/2013   AST 16 07/20/2013   NA 138 08/10/2013   K 3.8 08/10/2013   CL 103 08/10/2013   CREATININE 0.7 08/10/2013   BUN 14 08/10/2013   CO2 30 08/10/2013   TSH 3.09 07/20/2013   INR 1.0 07/23/2009   HGBA1C 6.2 03/20/2013       Assessment & Plan:

## 2013-11-06 NOTE — Patient Instructions (Signed)

## 2013-11-06 NOTE — Progress Notes (Signed)
Pre visit review using our clinic review tool, if applicable. No additional management support is needed unless otherwise documented below in the visit note. 

## 2013-11-07 NOTE — Assessment & Plan Note (Signed)
I will check her TSH and will adjust her dose if needed

## 2013-11-07 NOTE — Assessment & Plan Note (Signed)
She has achieved her LDL goal

## 2013-11-07 NOTE — Assessment & Plan Note (Signed)
Her CPK is negative for any evidence of inflammatory myopathy

## 2013-11-07 NOTE — Assessment & Plan Note (Signed)
Her BP is well controlled 

## 2013-11-09 ENCOUNTER — Ambulatory Visit: Payer: 59 | Admitting: Internal Medicine

## 2013-11-28 ENCOUNTER — Ambulatory Visit
Admission: RE | Admit: 2013-11-28 | Discharge: 2013-11-28 | Disposition: A | Discharge status: Home / Self Care | Payer: 59 | Source: Ambulatory Visit | Attending: Internal Medicine | Admitting: Internal Medicine

## 2013-11-28 DIAGNOSIS — M8580 Other specified disorders of bone density and structure, unspecified site: Secondary | ICD-10-CM

## 2013-11-29 ENCOUNTER — Encounter: Payer: Self-pay | Admitting: Internal Medicine

## 2013-11-29 LAB — HM DEXA SCAN
HM DEXA SCAN: NORMAL
HM DEXA SCAN: NORMAL

## 2013-12-11 ENCOUNTER — Encounter: Payer: Self-pay | Admitting: *Deleted

## 2013-12-11 ENCOUNTER — Encounter: Payer: Self-pay | Admitting: Gastroenterology

## 2013-12-18 ENCOUNTER — Encounter: Payer: Self-pay | Admitting: Gastroenterology

## 2013-12-18 ENCOUNTER — Ambulatory Visit (INDEPENDENT_AMBULATORY_CARE_PROVIDER_SITE_OTHER): Payer: 59 | Admitting: Gastroenterology

## 2013-12-18 VITALS — BP 120/80 | HR 76 | Ht 66.75 in | Wt 198.0 lb

## 2013-12-18 DIAGNOSIS — Z860100 Personal history of colon polyps, unspecified: Secondary | ICD-10-CM

## 2013-12-18 DIAGNOSIS — K589 Irritable bowel syndrome without diarrhea: Secondary | ICD-10-CM

## 2013-12-18 DIAGNOSIS — K5900 Constipation, unspecified: Secondary | ICD-10-CM

## 2013-12-18 MED ORDER — LUBIPROSTONE 8 MCG PO CAPS
8.0000 ug | ORAL_CAPSULE | Freq: Two times a day (BID) | ORAL | Status: DC
Start: 2013-12-18 — End: 2014-03-07

## 2013-12-18 NOTE — Progress Notes (Signed)
This is a 63 year old African American female with constipation predominant IBS doing well on when necessary Amitiza 8 mcg and when necessary MiraLax.  She denies acid reflux symptoms, hepatobiliary complaints, or severe constipation, melena or hematochezia.  Her appetite is good her weight is stable.  She recently changed blood pressure medications, is off diuretics, and is on potassium replacement which seems to have helped her constipation tremendously.  She is up-to-date her endoscopy and colonoscopies.  Current Medications, Allergies, Past Medical History, Past Surgical History, Family History and Social History were reviewed in [employer] record.  ROS: All systems were reviewed and are negative unless otherwise stated in the HPI.          Physical Exam: Blood pressure 120/80, pulse 76 and regular and weight 198 with a BMI of 31.26.  Chest is clear and she is a regular rhythm without murmurs gallops or rubs.  I cannot appreciate abdominal distention, organomegaly, masses or tenderness.  Bowel sounds are normal.  Peripheral extremities are unremarkable and mental status is normal.   Assessment and Plan:Constipation predominant IBS improved with potassium replacement.  I've renewed her medications for when necessary use, and advised her to follow a high fiber diet and to use liberal by mouth fluids.  We'll see her on a when necessary basis as needed with Dr.Pyrtle for her new physician since I am retiring.  She did have a large colon polyp removed in June of 2011 with followup colonoscopy in October of 2012 which was unremarkable.  She should 5 followup colonoscopy in October 2015, and I will put her in the system for recall.  Otherwise she is continue all medications as listed and reviewed.

## 2013-12-18 NOTE — Patient Instructions (Signed)
We have given you samples of the following medication to take: Amitiza 8 mcg, please take one capsule by mouth twice daily with food  High Fiber Diet below for your review.  Please follow up with Dr. Hilarie Fredrickson.  _________________________________________________________________________________________  High-Fiber Diet Fiber is found in fruits, vegetables, and grains. A high-fiber diet encourages the addition of more whole grains, legumes, fruits, and vegetables in your diet. The recommended amount of fiber for adult males is 38 g per day. For adult females, it is 25 g per day. Pregnant and lactating women should get 28 g of fiber per day. If you have a digestive or bowel problem, ask your caregiver for advice before adding high-fiber foods to your diet. Eat a variety of high-fiber foods instead of only a select few type of foods.  PURPOSE  To increase stool bulk.  To make bowel movements more regular to prevent constipation.  To lower cholesterol.  To prevent overeating. WHEN IS THIS DIET USED?  It may be used if you have constipation and hemorrhoids.  It may be used if you have uncomplicated diverticulosis (intestine condition) and irritable bowel syndrome.  It may be used if you need help with weight management.  It may be used if you want to add it to your diet as a protective measure against atherosclerosis, diabetes, and cancer. SOURCES OF FIBER  Whole-grain breads and cereals.  Fruits, such as apples, oranges, bananas, berries, prunes, and pears.  Vegetables, such as green peas, carrots, sweet potatoes, beets, broccoli, cabbage, spinach, and artichokes.  Legumes, such split peas, soy, lentils.  Almonds. FIBER CONTENT IN FOODS Starches and Grains / Dietary Fiber (g)  Cheerios, 1 cup / 3 g  Corn Flakes cereal, 1 cup / 0.7 g  Rice crispy treat cereal, 1 cup / 0.3 g  Instant oatmeal (cooked),  cup / 2 g  Frosted wheat cereal, 1 cup / 5.1 g  Brown, long-grain rice  (cooked), 1 cup / 3.5 g  White, long-grain rice (cooked), 1 cup / 0.6 g  Enriched macaroni (cooked), 1 cup / 2.5 g Legumes / Dietary Fiber (g)  Baked beans (canned, plain, or vegetarian),  cup / 5.2 g  Kidney beans (canned),  cup / 6.8 g  Pinto beans (cooked),  cup / 5.5 g Breads and Crackers / Dietary Fiber (g)  Plain or honey graham crackers, 2 squares / 0.7 g  Saltine crackers, 3 squares / 0.3 g  Plain, salted pretzels, 10 pieces / 1.8 g  Whole-wheat bread, 1 slice / 1.9 g  White bread, 1 slice / 0.7 g  Raisin bread, 1 slice / 1.2 g  Plain bagel, 3 oz / 2 g  Flour tortilla, 1 oz / 0.9 g  Corn tortilla, 1 small / 1.5 g  Hamburger or hotdog bun, 1 small / 0.9 g Fruits / Dietary Fiber (g)  Apple with skin, 1 medium / 4.4 g  Sweetened applesauce,  cup / 1.5 g  Banana,  medium / 1.5 g  Grapes, 10 grapes / 0.4 g  Orange, 1 small / 2.3 g  Raisin, 1.5 oz / 1.6 g  Melon, 1 cup / 1.4 g Vegetables / Dietary Fiber (g)  Green beans (canned),  cup / 1.3 g  Carrots (cooked),  cup / 2.3 g  Broccoli (cooked),  cup / 2.8 g  Peas (cooked),  cup / 4.4 g  Mashed potatoes,  cup / 1.6 g  Lettuce, 1 cup / 0.5 g  Corn (canned),  cup / 1.6 g  Tomato,  cup / 1.1 g Document Released: 11/15/2005 Document Revised: 05/16/2012 Document Reviewed: 02/17/2012 [facility] Patient Information 2014 [employer].

## 2014-02-04 ENCOUNTER — Other Ambulatory Visit: Payer: Self-pay | Admitting: Neurosurgery

## 2014-02-04 DIAGNOSIS — S129XXA Fracture of neck, unspecified, initial encounter: Secondary | ICD-10-CM

## 2014-02-07 ENCOUNTER — Ambulatory Visit
Admission: RE | Admit: 2014-02-07 | Discharge: 2014-02-07 | Disposition: A | Discharge status: Home / Self Care | Payer: 59 | Source: Ambulatory Visit | Attending: Neurosurgery | Admitting: Neurosurgery

## 2014-02-07 DIAGNOSIS — S129XXA Fracture of neck, unspecified, initial encounter: Secondary | ICD-10-CM

## 2014-03-07 ENCOUNTER — Other Ambulatory Visit (INDEPENDENT_AMBULATORY_CARE_PROVIDER_SITE_OTHER): Payer: 59

## 2014-03-07 ENCOUNTER — Ambulatory Visit: Payer: 59 | Admitting: Internal Medicine

## 2014-03-07 ENCOUNTER — Ambulatory Visit (INDEPENDENT_AMBULATORY_CARE_PROVIDER_SITE_OTHER): Payer: 59 | Admitting: Internal Medicine

## 2014-03-07 ENCOUNTER — Encounter: Payer: Self-pay | Admitting: Internal Medicine

## 2014-03-07 VITALS — BP 166/100 | HR 77 | Temp 97.1°F | Resp 16 | Wt 198.5 lb

## 2014-03-07 DIAGNOSIS — I10 Essential (primary) hypertension: Secondary | ICD-10-CM

## 2014-03-07 DIAGNOSIS — E876 Hypokalemia: Secondary | ICD-10-CM

## 2014-03-07 DIAGNOSIS — E039 Hypothyroidism, unspecified: Secondary | ICD-10-CM

## 2014-03-07 DIAGNOSIS — K5901 Slow transit constipation: Secondary | ICD-10-CM

## 2014-03-07 LAB — BASIC METABOLIC PANEL
BUN: 16 mg/dL (ref 6–23)
CO2: 26 meq/L (ref 19–32)
CREATININE: 0.8 mg/dL (ref 0.4–1.2)
Calcium: 9.2 mg/dL (ref 8.4–10.5)
Chloride: 106 mEq/L (ref 96–112)
GFR: 98.92 mL/min (ref >60.00)
Glucose, Bld: 96 mg/dL (ref 70–99)
POTASSIUM: 3.7 meq/L (ref 3.5–5.1)
Sodium: 141 mEq/L (ref 135–145)

## 2014-03-07 LAB — URINALYSIS, ROUTINE W REFLEX MICROSCOPIC
BILIRUBIN URINE: NEGATIVE
Hgb urine dipstick: NEGATIVE
Ketones, ur: NEGATIVE
LEUKOCYTES UA: NEGATIVE
Nitrite: NEGATIVE
PH: 5.5 (ref 5.0–8.0)
Total Protein, Urine: NEGATIVE
URINE GLUCOSE: NEGATIVE
Urobilinogen, UA: 1 (ref 0.0–1.0)

## 2014-03-07 LAB — TSH: TSH: 4.57 u[IU]/mL (ref 0.35–5.50)

## 2014-03-07 MED ORDER — OLMESARTAN-AMLODIPINE-HCTZ 40-5-12.5 MG PO TABS: 1.0000 | ORAL_TABLET | Freq: Every day | ORAL | Status: DC

## 2014-03-07 MED ORDER — LINACLOTIDE 145 MCG PO CAPS: 145.0000 ug | ORAL_CAPSULE | Freq: Every day | ORAL | Status: DC

## 2014-03-07 NOTE — Progress Notes (Signed)
Subjective:    Patient ID: [name], female    DOB: 12/13/1950, 63 y.o.   MRN: [id number]  Hypertension This is a chronic problem. The current episode started more than 1 year ago. The problem has been gradually worsening since onset. Associated symptoms include anxiety and headaches. Pertinent negatives include no blurred vision, chest pain, malaise/fatigue, neck pain, orthopnea, palpitations, peripheral edema, PND, shortness of breath or sweats. Agents associated with hypertension include NSAIDs. Past treatments include angiotensin blockers. The current treatment provides moderate improvement. Compliance problems include diet and exercise.  Hypertensive end-organ damage includes a thyroid problem.      Review of Systems  Constitutional: Negative for fever, chills, malaise/fatigue, diaphoresis, appetite change and fatigue.  Eyes: Negative.  Negative for blurred vision.  Respiratory: Negative.  Negative for cough, choking, chest tightness, shortness of breath and stridor.   Cardiovascular: Negative.  Negative for chest pain, palpitations, orthopnea, leg swelling and PND.  Gastrointestinal: Positive for constipation. Negative for nausea, vomiting, abdominal pain, diarrhea, blood in stool, abdominal distention, anal bleeding and rectal pain.  Endocrine: Negative.   Genitourinary: Negative.   Musculoskeletal: Negative.  Negative for arthralgias, myalgias, neck pain and neck stiffness.  Skin: Negative.   Allergic/Immunologic: Negative.   Neurological: Positive for headaches. Negative for dizziness, tremors, seizures, syncope, facial asymmetry, speech difficulty, weakness, light-headedness and numbness.  Hematological: Negative.  Negative for adenopathy. Does not bruise/bleed easily.  Psychiatric/Behavioral: Negative for suicidal ideas, hallucinations, behavioral problems, confusion, sleep disturbance, self-injury, dysphoric mood, decreased concentration and agitation. The patient is  nervous/anxious. The patient is not hyperactive.        Objective:   Physical Exam  Vitals reviewed. Constitutional: She is oriented to person, place, and time. She appears well-developed and well-nourished. No distress.  HENT:  Head: Normocephalic and atraumatic.  Mouth/Throat: Oropharynx is clear and moist. No oropharyngeal exudate.  Eyes: Conjunctivae are normal. Right eye exhibits no discharge. Left eye exhibits no discharge. No scleral icterus.  Neck: Normal range of motion. Neck supple. No JVD present. No tracheal deviation present. No thyromegaly present.  Cardiovascular: Normal rate, regular rhythm, normal heart sounds and intact distal pulses.  Exam reveals no gallop and no friction rub.   No murmur heard. Pulmonary/Chest: Effort normal and breath sounds normal. No stridor. No respiratory distress. She has no wheezes. She has no rales. She exhibits no tenderness.  Abdominal: Soft. Bowel sounds are normal. She exhibits no distension and no mass. There is no tenderness. There is no rebound and no guarding.  Musculoskeletal: Normal range of motion. She exhibits no edema and no tenderness.  Lymphadenopathy:    She has no cervical adenopathy.  Neurological: She is oriented to person, place, and time.  Skin: Skin is warm and dry. No rash noted. She is not diaphoretic. No erythema. No pallor.  Psychiatric: She has a normal mood and affect. Her behavior is normal. Judgment and thought content normal.     Lab Results  Component Value Date   WBC 8.6 07/20/2013   HGB 13.0 07/20/2013   HCT 38.8 07/20/2013   PLT 315.0 07/20/2013   GLUCOSE 96 11/06/2013   CHOL 149 11/06/2013   TRIG 131.0 11/06/2013   HDL 38.00* 11/06/2013   LDLDIRECT 130.0 03/20/2013   LDLCALC 85 11/06/2013   ALT 13 11/06/2013   AST 16 11/06/2013   NA 137 11/06/2013   K 3.8 11/06/2013   CL 104 11/06/2013   CREATININE 0.7 11/06/2013   BUN 12 11/06/2013   CO2 28  11/06/2013   TSH 1.68 11/06/2013   INR 1.0 07/23/2009   HGBA1C 6.2  03/20/2013       Assessment & Plan:

## 2014-03-07 NOTE — Assessment & Plan Note (Signed)
Her BP is not well controlled, complicating factors are the intake of aleve for cervical disc disease I will check her labs today to look for secondary causes of HTN and to look for end organ damage I will upgrade her treatment to tribenzor for better BP control

## 2014-03-07 NOTE — Patient Instructions (Signed)

## 2014-03-07 NOTE — Assessment & Plan Note (Signed)
She is taking norco for cervical disease so I think that is the main cause for her to c/o constipation and she tells me that [place] has not helped much I have asked her to try linzess

## 2014-03-07 NOTE — Assessment & Plan Note (Signed)
I will recheck her TSH level today and will adjust her dose if needed

## 2014-03-07 NOTE — Progress Notes (Signed)
Pre visit review using our clinic review tool, if applicable. No additional management support is needed unless otherwise documented below in the visit note. 

## 2014-03-14 ENCOUNTER — Other Ambulatory Visit: Payer: Self-pay | Admitting: Internal Medicine

## 2014-04-05 ENCOUNTER — Encounter: Payer: Self-pay | Admitting: Internal Medicine

## 2014-04-05 ENCOUNTER — Ambulatory Visit (INDEPENDENT_AMBULATORY_CARE_PROVIDER_SITE_OTHER): Payer: 59 | Admitting: Internal Medicine

## 2014-04-05 VITALS — BP 130/74 | HR 72 | Temp 97.4°F | Resp 16 | Ht 66.75 in | Wt 195.4 lb

## 2014-04-05 DIAGNOSIS — M6530 Trigger finger, unspecified finger: Secondary | ICD-10-CM | POA: Insufficient documentation

## 2014-04-05 DIAGNOSIS — I10 Essential (primary) hypertension: Secondary | ICD-10-CM

## 2014-04-05 MED ORDER — OLMESARTAN MEDOXOMIL 20 MG PO TABS: 20.0000 mg | ORAL_TABLET | Freq: Every day | ORAL | Status: DC

## 2014-04-05 NOTE — Assessment & Plan Note (Signed)
Ortho referral  

## 2014-04-05 NOTE — Progress Notes (Signed)
Subjective:    Patient ID: [name], female    DOB: 05/25/1951, 63 y.o.   MRN: [id number]  Hypertension This is a recurrent problem. The current episode started more than 1 year ago. The problem has been rapidly improving since onset. The problem is controlled. Pertinent negatives include no anxiety, blurred vision, chest pain, headaches, malaise/fatigue, neck pain, orthopnea, palpitations, peripheral edema, PND, shortness of breath or sweats. (She was on tribenzor but felt fatigued and dizzy so she has been on just benicar and has felt fine and BP has been well controleld.) There are no associated agents to hypertension. Past treatments include angiotensin blockers. Compliance problems include medication side effects.       Review of Systems  Constitutional: Negative.  Negative for fever, chills, malaise/fatigue, diaphoresis, appetite change and fatigue.  HENT: Negative.   Eyes: Negative.  Negative for blurred vision.  Respiratory: Negative.  Negative for cough, choking, chest tightness, shortness of breath, wheezing and stridor.   Cardiovascular: Negative.  Negative for chest pain, palpitations, orthopnea, leg swelling and PND.  Gastrointestinal: Negative.  Negative for nausea, vomiting, abdominal pain, diarrhea, constipation and blood in stool.  Endocrine: Negative.   Genitourinary: Negative.   Musculoskeletal: Negative.  Negative for neck pain.       She has a trigger mechanism on her left thumb  Skin: Negative.   Allergic/Immunologic: Negative.   Neurological: Negative.  Negative for headaches.  Hematological: Negative.  Negative for adenopathy. Does not bruise/bleed easily.  Psychiatric/Behavioral: Negative.        Objective:   Physical Exam  Vitals reviewed. Constitutional: She is oriented to person, place, and time. She appears well-developed and well-nourished. No distress.  HENT:  Head: Normocephalic and atraumatic.  Mouth/Throat: Oropharynx is clear and moist. No  oropharyngeal exudate.  Eyes: Conjunctivae are normal. Right eye exhibits no discharge. Left eye exhibits no discharge. No scleral icterus.  Neck: Normal range of motion. Neck supple. No JVD present. No tracheal deviation present. No thyromegaly present.  Cardiovascular: Normal rate, regular rhythm, normal heart sounds and intact distal pulses.  Exam reveals no gallop and no friction rub.   No murmur heard. Pulmonary/Chest: Breath sounds normal. No stridor. No respiratory distress. She has no wheezes. She has no rales. She exhibits no tenderness.  Abdominal: Soft. Bowel sounds are normal. She exhibits no distension and no mass. There is no tenderness. There is no rebound and no guarding.  Musculoskeletal: Normal range of motion. She exhibits no edema and no tenderness.       Hands: Lymphadenopathy:    She has no cervical adenopathy.  Neurological: She is oriented to person, place, and time.  Skin: Skin is warm and dry. No rash noted. She is not diaphoretic. No erythema. No pallor.      Lab Results  Component Value Date   WBC 8.6 07/20/2013   HGB 13.0 07/20/2013   HCT 38.8 07/20/2013   PLT 315.0 07/20/2013   GLUCOSE 96 03/07/2014   CHOL 149 11/06/2013   TRIG 131.0 11/06/2013   HDL 38.00* 11/06/2013   LDLDIRECT 130.0 03/20/2013   LDLCALC 85 11/06/2013   ALT 13 11/06/2013   AST 16 11/06/2013   NA 141 03/07/2014   K 3.7 03/07/2014   CL 106 03/07/2014   CREATININE 0.8 03/07/2014   BUN 16 03/07/2014   CO2 26 03/07/2014   TSH 4.57 03/07/2014   INR 1.0 07/23/2009   HGBA1C 6.2 03/20/2013      Assessment & Plan:

## 2014-04-05 NOTE — Progress Notes (Signed)
Pre visit review using our clinic review tool, if applicable. No additional management support is needed unless otherwise documented below in the visit note. 

## 2014-04-05 NOTE — Patient Instructions (Signed)

## 2014-04-05 NOTE — Assessment & Plan Note (Signed)
Her BP is well controlled 

## 2014-04-06 ENCOUNTER — Telehealth: Payer: Self-pay | Admitting: Internal Medicine

## 2014-04-06 NOTE — Telephone Encounter (Signed)
Relevant patient education mailed to patient.  

## 2014-04-11 ENCOUNTER — Other Ambulatory Visit: Payer: Self-pay | Admitting: Internal Medicine

## 2014-07-12 ENCOUNTER — Other Ambulatory Visit: Payer: Self-pay | Admitting: Internal Medicine

## 2014-07-12 DIAGNOSIS — Z1231 Encounter for screening mammogram for malignant neoplasm of breast: Secondary | ICD-10-CM

## 2014-08-06 ENCOUNTER — Encounter: Payer: Self-pay | Admitting: Internal Medicine

## 2014-08-06 ENCOUNTER — Other Ambulatory Visit (INDEPENDENT_AMBULATORY_CARE_PROVIDER_SITE_OTHER): Payer: 59

## 2014-08-06 ENCOUNTER — Ambulatory Visit (INDEPENDENT_AMBULATORY_CARE_PROVIDER_SITE_OTHER): Payer: 59 | Admitting: Internal Medicine

## 2014-08-06 ENCOUNTER — Ambulatory Visit (INDEPENDENT_AMBULATORY_CARE_PROVIDER_SITE_OTHER)
Admission: RE | Admit: 2014-08-06 | Discharge: 2014-08-06 | Disposition: A | Discharge status: Home / Self Care | Payer: 59 | Source: Ambulatory Visit | Attending: Internal Medicine | Admitting: Internal Medicine

## 2014-08-06 VITALS — BP 112/70 | HR 80 | Temp 98.3°F | Resp 16 | Ht 66.75 in | Wt 200.0 lb

## 2014-08-06 DIAGNOSIS — E876 Hypokalemia: Secondary | ICD-10-CM

## 2014-08-06 DIAGNOSIS — E785 Hyperlipidemia, unspecified: Secondary | ICD-10-CM

## 2014-08-06 DIAGNOSIS — R059 Cough, unspecified: Secondary | ICD-10-CM

## 2014-08-06 DIAGNOSIS — R05 Cough: Secondary | ICD-10-CM

## 2014-08-06 DIAGNOSIS — I10 Essential (primary) hypertension: Secondary | ICD-10-CM

## 2014-08-06 DIAGNOSIS — R202 Paresthesia of skin: Secondary | ICD-10-CM

## 2014-08-06 DIAGNOSIS — Z124 Encounter for screening for malignant neoplasm of cervix: Secondary | ICD-10-CM | POA: Insufficient documentation

## 2014-08-06 DIAGNOSIS — J13 Pneumonia due to Streptococcus pneumoniae: Secondary | ICD-10-CM

## 2014-08-06 DIAGNOSIS — E039 Hypothyroidism, unspecified: Secondary | ICD-10-CM

## 2014-08-06 DIAGNOSIS — R209 Unspecified disturbances of skin sensation: Secondary | ICD-10-CM

## 2014-08-06 LAB — BASIC METABOLIC PANEL
BUN: 11 mg/dL (ref 6–23)
CALCIUM: 9.2 mg/dL (ref 8.4–10.5)
CO2: 27 mEq/L (ref 19–32)
Chloride: 99 mEq/L (ref 96–112)
Creatinine, Ser: 0.9 mg/dL (ref 0.4–1.2)
GFR: 85.65 mL/min (ref >60.00)
Glucose, Bld: 95 mg/dL (ref 70–99)
Potassium: 3.4 mEq/L — ABNORMAL LOW (ref 3.5–5.1)
Sodium: 136 mEq/L (ref 135–145)

## 2014-08-06 LAB — TSH: TSH: 0.99 u[IU]/mL (ref 0.35–4.50)

## 2014-08-06 LAB — FOLATE: FOLATE: 11.9 ng/mL (ref >5.9)

## 2014-08-06 LAB — VITAMIN B12: VITAMIN B 12: 353 pg/mL (ref 211–911)

## 2014-08-06 LAB — MAGNESIUM: MAGNESIUM: 1.8 mg/dL (ref 1.5–2.5)

## 2014-08-06 MED ORDER — LOSARTAN POTASSIUM 50 MG PO TABS: 50.0000 mg | ORAL_TABLET | Freq: Every day | ORAL | Status: DC

## 2014-08-06 MED ORDER — AZITHROMYCIN 500 MG PO TABS: 500.0000 mg | ORAL_TABLET | Freq: Every day | ORAL | Status: DC

## 2014-08-06 NOTE — Assessment & Plan Note (Signed)
I will recheck her TSH and will adjust her dose if needed 

## 2014-08-06 NOTE — Progress Notes (Signed)
Subjective:    Patient ID: [name], female    DOB: May 13, 1951, 63 y.o.   MRN: [id number]  Cough This is a new problem. The current episode started in the past 7 days. The problem has been gradually worsening. The problem occurs every few hours. The cough is productive of purulent sputum. Associated symptoms include chills, a sore throat, shortness of breath and sweats. Pertinent negatives include no chest pain, ear congestion, ear pain, fever, headaches, heartburn, hemoptysis, myalgias, nasal congestion, postnasal drip, rash, rhinorrhea, weight loss or wheezing. Nothing aggravates the symptoms. She has tried OTC cough suppressant for the symptoms. The treatment provided moderate relief. Her past medical history is significant for pneumonia. There is no history of asthma, bronchiectasis, bronchitis, COPD, emphysema or environmental allergies.      Review of Systems  Constitutional: Positive for chills. Negative for fever, weight loss, diaphoresis, appetite change and fatigue.  HENT: Positive for sore throat. Negative for congestion, ear pain, facial swelling, postnasal drip, rhinorrhea, sinus pressure, tinnitus and trouble swallowing.   Eyes: Negative.   Respiratory: Positive for cough and shortness of breath. Negative for apnea, hemoptysis, choking, chest tightness, wheezing and stridor.   Cardiovascular: Negative.  Negative for chest pain, palpitations and leg swelling.  Gastrointestinal: Negative.  Negative for heartburn and abdominal pain.  Endocrine: Negative.   Genitourinary: Negative.   Musculoskeletal: Negative.  Negative for arthralgias, back pain and myalgias.  Skin: Negative.  Negative for rash.  Allergic/Immunologic: Negative.  Negative for environmental allergies.  Neurological: Negative.  Negative for headaches.  Hematological: Negative.  Negative for adenopathy.  Psychiatric/Behavioral: Negative.        Objective:   Physical Exam  Vitals reviewed. Constitutional:  She is oriented to person, place, and time. She appears well-developed and well-nourished.  Non-toxic appearance. She does not have a sickly appearance. She does not appear ill. No distress.  HENT:  Head: Normocephalic and atraumatic.  Mouth/Throat: Oropharynx is clear and moist. No oropharyngeal exudate.  Eyes: Conjunctivae are normal. Right eye exhibits no discharge. Left eye exhibits no discharge. No scleral icterus.  Neck: Normal range of motion. Neck supple. No JVD present. No tracheal deviation present. No thyromegaly present.  Cardiovascular: Normal rate, regular rhythm, normal heart sounds and intact distal pulses.  Exam reveals no gallop and no friction rub.   No murmur heard. Pulmonary/Chest: Effort normal. No stridor. No respiratory distress. She has no wheezes. She has no rhonchi. She has rales in the right lower field. She exhibits no tenderness.  Abdominal: Soft. Bowel sounds are normal. She exhibits no distension and no mass. There is no tenderness. There is no rebound and no guarding.  Musculoskeletal: Normal range of motion. She exhibits no edema and no tenderness.  Lymphadenopathy:    She has no cervical adenopathy.  Neurological: She is oriented to person, place, and time.  Skin: Skin is warm and dry. No rash noted. She is not diaphoretic. No erythema. No pallor.  Psychiatric: She has a normal mood and affect. Her behavior is normal. Judgment and thought content normal.     Lab Results  Component Value Date   WBC 8.6 07/20/2013   HGB 13.0 07/20/2013   HCT 38.8 07/20/2013   PLT 315.0 07/20/2013   GLUCOSE 96 03/07/2014   CHOL 149 11/06/2013   TRIG 131.0 11/06/2013   HDL 38.00* 11/06/2013   LDLDIRECT 130.0 03/20/2013   LDLCALC 85 11/06/2013   ALT 13 11/06/2013   AST 16 11/06/2013   NA 141 03/07/2014  K 3.7 03/07/2014   CL 106 03/07/2014   CREATININE 0.8 03/07/2014   BUN 16 03/07/2014   CO2 26 03/07/2014   TSH 4.57 03/07/2014   INR 1.0 07/23/2009   HGBA1C 6.2 03/20/2013         Assessment & Plan:

## 2014-08-06 NOTE — Assessment & Plan Note (Signed)
She has achieved her LDL goal

## 2014-08-06 NOTE — Patient Instructions (Signed)
Cough, Adult  A cough is a reflex that helps clear your throat and airways. It can help heal the body or may be a reaction to an irritated airway. A cough may only last 2 or 3 weeks (acute) or may last more than 8 weeks (chronic).  CAUSES Acute cough:  Viral or bacterial infections. Chronic cough:  Infections.  Allergies.  Asthma.  Post-nasal drip.  Smoking.  Heartburn or acid reflux.  Some medicines.  Chronic lung problems (COPD).  Cancer. SYMPTOMS   Cough.  Fever.  Chest pain.  Increased breathing rate.  High-pitched whistling sound when breathing (wheezing).  Colored mucus that you cough up (sputum). TREATMENT   A bacterial cough may be treated with antibiotic medicine.  A viral cough must run its course and will not respond to antibiotics.  Your caregiver may recommend other treatments if you have a chronic cough. HOME CARE INSTRUCTIONS   Only take over-the-counter or prescription medicines for pain, discomfort, or fever as directed by your caregiver. Use cough suppressants only as directed by your caregiver.  Use a cold steam vaporizer or humidifier in your bedroom or home to help loosen secretions.  Sleep in a semi-upright position if your cough is worse at night.  Rest as needed.  Stop smoking if you smoke. SEEK IMMEDIATE MEDICAL CARE IF:   You have pus in your sputum.  Your cough starts to worsen.  You cannot control your cough with suppressants and are losing sleep.  You begin coughing up blood.  You have difficulty breathing.  You develop pain which is getting worse or is uncontrolled with medicine.  You have a fever. MAKE SURE YOU:   Understand these instructions.  Will watch your condition.  Will get help right away if you are not doing well or get worse. Document Released: 05/14/2011 Document Revised: 02/07/2012 Document Reviewed: 05/14/2011 [employer] Patient Information 2015 [employer]. This information is not intended  to replace advice given to you by your health care provider. Make sure you discuss any questions you have with your health care provider.  

## 2014-08-06 NOTE — Assessment & Plan Note (Signed)
I will treat with zithromax

## 2014-08-06 NOTE — Assessment & Plan Note (Signed)
CXR is normal though clinically she appears to have PNA, will treat with zithromax

## 2014-08-06 NOTE — Progress Notes (Signed)
Pre visit review using our clinic review tool, if applicable. No additional management support is needed unless otherwise documented below in the visit note. 

## 2014-08-06 NOTE — Assessment & Plan Note (Signed)
GYN referral at her request

## 2014-08-06 NOTE — Assessment & Plan Note (Signed)
Her BP is well controlled 

## 2014-08-07 ENCOUNTER — Encounter: Payer: Self-pay | Admitting: Internal Medicine

## 2014-08-12 ENCOUNTER — Ambulatory Visit (HOSPITAL_COMMUNITY)
Admission: RE | Admit: 2014-08-12 | Discharge: 2014-08-12 | Disposition: A | Discharge status: Home / Self Care | Payer: 59 | Source: Ambulatory Visit | Attending: Internal Medicine | Admitting: Internal Medicine

## 2014-08-12 DIAGNOSIS — Z1231 Encounter for screening mammogram for malignant neoplasm of breast: Secondary | ICD-10-CM | POA: Diagnosis not present

## 2014-08-12 LAB — HM MAMMOGRAPHY: HM Mammogram: NORMAL

## 2014-08-27 ENCOUNTER — Ambulatory Visit (INDEPENDENT_AMBULATORY_CARE_PROVIDER_SITE_OTHER): Payer: 59 | Admitting: Internal Medicine

## 2014-08-27 ENCOUNTER — Encounter: Payer: Self-pay | Admitting: Internal Medicine

## 2014-08-27 VITALS — BP 140/82 | HR 64 | Temp 98.5°F | Resp 16 | Ht 60.68 in | Wt 205.0 lb

## 2014-08-27 DIAGNOSIS — I10 Essential (primary) hypertension: Secondary | ICD-10-CM

## 2014-08-27 DIAGNOSIS — R0602 Shortness of breath: Secondary | ICD-10-CM | POA: Insufficient documentation

## 2014-08-27 DIAGNOSIS — E039 Hypothyroidism, unspecified: Secondary | ICD-10-CM

## 2014-08-27 NOTE — Progress Notes (Signed)
Pre visit review using our clinic review tool, if applicable. No additional management support is needed unless otherwise documented below in the visit note. 

## 2014-08-27 NOTE — Progress Notes (Signed)
Subjective:    Patient ID: [name], female    DOB: 15-Sep-1951, 63 y.o.   MRN: [id number]  Hypertension This is a chronic problem. The current episode started more than 1 year ago. The problem has been gradually improving since onset. The problem is controlled. Associated symptoms include shortness of breath (she had a brief episode of SOB yesterday while sitting and has felt fine since then). Pertinent negatives include no anxiety, blurred vision, chest pain, headaches, malaise/fatigue, neck pain, orthopnea, palpitations, peripheral edema, PND or sweats. There are no associated agents to hypertension. Past treatments include angiotensin blockers. The current treatment provides moderate improvement. There are no compliance problems.  Hypertensive end-organ damage includes a thyroid problem.      Review of Systems  Constitutional: Negative.  Negative for fever, chills, malaise/fatigue, diaphoresis, appetite change and fatigue.  HENT: Negative.   Eyes: Negative.  Negative for blurred vision.  Respiratory: Positive for shortness of breath (she had a brief episode of SOB yesterday while sitting and has felt fine since then). Negative for apnea, cough, choking, chest tightness and stridor.   Cardiovascular: Negative.  Negative for chest pain, palpitations, orthopnea, leg swelling and PND.  Gastrointestinal: Negative.  Negative for nausea, vomiting, abdominal pain, diarrhea, constipation and blood in stool.  Endocrine: Negative.   Genitourinary: Negative.   Musculoskeletal: Negative.  Negative for arthralgias, back pain, joint swelling, myalgias and neck pain.  Skin: Negative.  Negative for rash.  Allergic/Immunologic: Negative.   Neurological: Negative.  Negative for headaches.  Hematological: Negative.  Negative for adenopathy. Does not bruise/bleed easily.  Psychiatric/Behavioral: Negative.        Objective:   Physical Exam  Vitals reviewed. Constitutional: She is oriented to  person, place, and time. She appears well-developed and well-nourished. No distress.  HENT:  Head: Normocephalic and atraumatic.  Mouth/Throat: Oropharynx is clear and moist. No oropharyngeal exudate.  Eyes: Conjunctivae are normal. Right eye exhibits no discharge. Left eye exhibits no discharge. No scleral icterus.  Neck: Normal range of motion. Neck supple. No JVD present. No tracheal deviation present. No thyromegaly present.  Cardiovascular: Normal rate, regular rhythm, normal heart sounds and intact distal pulses.  Exam reveals no gallop and no friction rub.   No murmur heard. Pulmonary/Chest: Effort normal and breath sounds normal. No stridor. No respiratory distress. She has no wheezes. She has no rales. She exhibits no tenderness.  Abdominal: Soft. Bowel sounds are normal. She exhibits no distension and no mass. There is no tenderness. There is no rebound and no guarding.  Musculoskeletal: Normal range of motion. She exhibits no edema and no tenderness.  Lymphadenopathy:    She has no cervical adenopathy.  Neurological: She is oriented to person, place, and time.  Skin: Skin is warm and dry. No rash noted. She is not diaphoretic. No erythema. No pallor.  Psychiatric: She has a normal mood and affect. Her behavior is normal. Judgment and thought content normal.     Lab Results  Component Value Date   WBC 8.6 07/20/2013   HGB 13.0 07/20/2013   HCT 38.8 07/20/2013   PLT 315.0 07/20/2013   GLUCOSE 95 08/06/2014   CHOL 149 11/06/2013   TRIG 131.0 11/06/2013   HDL 38.00* 11/06/2013   LDLDIRECT 130.0 03/20/2013   LDLCALC 85 11/06/2013   ALT 13 11/06/2013   AST 16 11/06/2013   NA 136 08/06/2014   K 3.4* 08/06/2014   CL 99 08/06/2014   CREATININE 0.9 08/06/2014   BUN 11 08/06/2014  CO2 27 08/06/2014   TSH 0.99 08/06/2014   INR 1.0 07/23/2009   HGBA1C 6.2 03/20/2013       Assessment & Plan:

## 2014-08-27 NOTE — Assessment & Plan Note (Signed)
Her exam and EKG are normal The SOB has resolved, will follow for now

## 2014-08-27 NOTE — Patient Instructions (Signed)

## 2014-08-27 NOTE — Assessment & Plan Note (Signed)
Her BP is well controlled 

## 2014-08-27 NOTE — Assessment & Plan Note (Signed)
Her recent TSH was in the normal range She will stay on the current dose

## 2014-09-04 ENCOUNTER — Other Ambulatory Visit: Payer: Self-pay | Admitting: Neurosurgery

## 2014-09-04 DIAGNOSIS — M5136 Other intervertebral disc degeneration, lumbar region: Secondary | ICD-10-CM

## 2014-09-09 ENCOUNTER — Other Ambulatory Visit: Payer: Self-pay

## 2014-09-09 MED ORDER — PRAVASTATIN SODIUM 20 MG PO TABS: ORAL_TABLET | ORAL | Status: DC

## 2014-09-10 ENCOUNTER — Ambulatory Visit: Payer: Self-pay | Admitting: Gynecology

## 2014-09-16 ENCOUNTER — Ambulatory Visit
Admission: RE | Admit: 2014-09-16 | Discharge: 2014-09-16 | Disposition: A | Discharge status: Home / Self Care | Payer: 59 | Source: Ambulatory Visit | Attending: Neurosurgery | Admitting: Neurosurgery

## 2014-09-16 ENCOUNTER — Encounter: Payer: Self-pay | Admitting: Gynecology

## 2014-09-16 ENCOUNTER — Other Ambulatory Visit (HOSPITAL_COMMUNITY)
Admission: RE | Admit: 2014-09-16 | Discharge: 2014-09-16 | Disposition: A | Discharge status: Home / Self Care | Payer: 59 | Source: Ambulatory Visit | Attending: Gynecology | Admitting: Gynecology

## 2014-09-16 ENCOUNTER — Ambulatory Visit (INDEPENDENT_AMBULATORY_CARE_PROVIDER_SITE_OTHER): Payer: 59 | Admitting: Gynecology

## 2014-09-16 VITALS — BP 132/86 | Ht 67.0 in | Wt 199.0 lb

## 2014-09-16 DIAGNOSIS — Z01419 Encounter for gynecological examination (general) (routine) without abnormal findings: Secondary | ICD-10-CM | POA: Insufficient documentation

## 2014-09-16 DIAGNOSIS — Z1151 Encounter for screening for human papillomavirus (HPV): Secondary | ICD-10-CM | POA: Insufficient documentation

## 2014-09-16 DIAGNOSIS — M5136 Other intervertebral disc degeneration, lumbar region: Secondary | ICD-10-CM

## 2014-09-16 DIAGNOSIS — Z780 Asymptomatic menopausal state: Secondary | ICD-10-CM

## 2014-09-16 DIAGNOSIS — N952 Postmenopausal atrophic vaginitis: Secondary | ICD-10-CM

## 2014-09-16 MED ORDER — GADOBENATE DIMEGLUMINE 529 MG/ML IV SOLN
17.0000 mL | Freq: Once | INTRAVENOUS | Status: AC | PRN
  Administered 2014-09-16: 17 mL via INTRAVENOUS

## 2014-09-16 NOTE — Patient Instructions (Signed)

## 2014-09-16 NOTE — Progress Notes (Signed)
CAELEY DOHRMANN 09/29/1951 [id number]   History:    63 y.o.  for annual gyn exam who has a new patient to the practice that was referred to [place] from Dr. [name] her primary physician. He has been treating patient for hyperglycemia, hypothyroidism and hypertension. Patient has shingles vaccine, Tdap vaccine and flu vaccine all day. Patient's last Pap smear was in 2012. Patient denies any past history of abnormal Pap smears. Many years ago patient had an abdominal hysterectomy and years afterward she had bilateral salpingo-oophorectomy all for benign indications for which we have no documentation. Dr. Sharlett Iles has been doing her colonoscopy. In 2012 benign polyps removed. Her mammogram was done this year which was reportedly normal. Also she had normal bone density study according to the patient will have documentation. Patient many years ago was October there were short time her on hormones. Patient sexually active with no complaints.  Past medical history,surgical history, family history and social history were all reviewed and documented in the EPIC chart.  Gynecologic History No LMP recorded. Patient has had a hysterectomy. Contraception: post menopausal status Last Pap: 2012. Results were: normal Last mammogram: 2015. Results were: normal  Obstetric History OB History  Gravida Para Term Preterm AB SAB TAB Ectopic Multiple Living  3 2   1 1    2     # Outcome Date GA Lbr Len/2nd Weight Sex Delivery Anes PTL Lv  3 SAB           2 PAR           1 PAR                ROS: A ROS was performed and pertinent positives and negatives are included in the history.  GENERAL: No fevers or chills. HEENT: No change in vision, no earache, sore throat or sinus congestion. NECK: No pain or stiffness. CARDIOVASCULAR: No chest pain or pressure. No palpitations. PULMONARY: No shortness of breath, cough or wheeze. GASTROINTESTINAL: No abdominal pain, nausea, vomiting or diarrhea, melena or bright red  blood per rectum. GENITOURINARY: No urinary frequency, urgency, hesitancy or dysuria. MUSCULOSKELETAL: No joint or muscle pain, no back pain, no recent trauma. DERMATOLOGIC: No rash, no itching, no lesions. ENDOCRINE: No polyuria, polydipsia, no heat or cold intolerance. No recent change in weight. HEMATOLOGICAL: No anemia or easy bruising or bleeding. NEUROLOGIC: No headache, seizures, numbness, tingling or weakness. PSYCHIATRIC: No depression, no loss of interest in normal activity or change in sleep pattern.     Exam: chaperone present  BP 132/86  Ht 5' 7"  (1.702 m)  Wt 199 lb (90.266 kg)  BMI 31.16 kg/m2  Body mass index is 31.16 kg/(m^2).  General appearance : Well developed well nourished female. No acute distress HEENT: Neck supple, trachea midline, no carotid bruits, no thyroidmegaly Lungs: Clear to auscultation, no rhonchi or wheezes, or rib retractions  Heart: Regular rate and rhythm, no murmurs or gallops Breast:Examined in sitting and supine position were symmetrical in appearance, no palpable masses or tenderness,  no skin retraction, no nipple inversion, no nipple discharge, no skin discoloration, no axillary or supraclavicular lymphadenopathy Abdomen: no palpable masses or tenderness, no rebound or guarding Extremities: no edema or skin discoloration or tenderness  Pelvic:  Bartholin, Urethra, Skene Glands: Within normal limits             Vagina: No gross lesions or discharge, atrophic changes  Cervix: Absent  Uterus  Absent  Adnexa  Without masses or tenderness  Anus and perineum  normal   Rectovaginal  normal sphincter tone without palpated masses or tenderness             Hemoccult cards provided     Assessment/Plan:  63 y.o. female for annual exam with no vaginal atrophy sexually active i with no complaints or vasomotor symptoms. Her PCP has been doing her blood work. Pap smear was done today. After today's Pap smear is patient will any Pap smears. She was  provided Hemoccult Hemoccult cards to submit to the office for testing. We discussed importance of calcium vitamin D and regular exercise for osteoporosis. She will contact her PCP is a copy of her last bone density study.   Terrance Mass MD, 9:40 AM 09/16/2014

## 2014-09-17 LAB — CYTOLOGY - PAP

## 2014-09-30 ENCOUNTER — Encounter: Payer: Self-pay | Admitting: Gynecology

## 2014-10-15 ENCOUNTER — Other Ambulatory Visit: Payer: Self-pay | Admitting: Internal Medicine

## 2014-12-14 ENCOUNTER — Other Ambulatory Visit: Payer: Self-pay | Admitting: Internal Medicine

## 2014-12-31 ENCOUNTER — Other Ambulatory Visit (INDEPENDENT_AMBULATORY_CARE_PROVIDER_SITE_OTHER): Payer: 59

## 2014-12-31 ENCOUNTER — Ambulatory Visit (INDEPENDENT_AMBULATORY_CARE_PROVIDER_SITE_OTHER): Payer: 59 | Admitting: Internal Medicine

## 2014-12-31 ENCOUNTER — Encounter: Payer: Self-pay | Admitting: Internal Medicine

## 2014-12-31 VITALS — BP 122/80 | HR 69 | Temp 98.5°F | Resp 16 | Ht 67.0 in | Wt 201.0 lb

## 2014-12-31 DIAGNOSIS — E785 Hyperlipidemia, unspecified: Secondary | ICD-10-CM

## 2014-12-31 DIAGNOSIS — Z0000 Encounter for general adult medical examination without abnormal findings: Secondary | ICD-10-CM

## 2014-12-31 DIAGNOSIS — E781 Pure hyperglyceridemia: Secondary | ICD-10-CM

## 2014-12-31 DIAGNOSIS — I10 Essential (primary) hypertension: Secondary | ICD-10-CM

## 2014-12-31 DIAGNOSIS — R739 Hyperglycemia, unspecified: Secondary | ICD-10-CM

## 2014-12-31 DIAGNOSIS — E038 Other specified hypothyroidism: Secondary | ICD-10-CM

## 2014-12-31 DIAGNOSIS — IMO0001 Reserved for inherently not codable concepts without codable children: Secondary | ICD-10-CM

## 2014-12-31 DIAGNOSIS — F411 Generalized anxiety disorder: Secondary | ICD-10-CM

## 2014-12-31 LAB — COMPREHENSIVE METABOLIC PANEL
ALBUMIN: 4 g/dL (ref 3.5–5.2)
ALT: 9 U/L (ref 0–35)
AST: 14 U/L (ref 0–37)
Alkaline Phosphatase: 102 U/L (ref 39–117)
BILIRUBIN TOTAL: 0.6 mg/dL (ref 0.2–1.2)
BUN: 9 mg/dL (ref 6–23)
CO2: 30 mEq/L (ref 19–32)
Calcium: 9.6 mg/dL (ref 8.4–10.5)
Chloride: 103 mEq/L (ref 96–112)
Creatinine, Ser: 0.79 mg/dL (ref 0.40–1.20)
GFR: 94.35 mL/min (ref >60.00)
GLUCOSE: 105 mg/dL — AB (ref 70–99)
POTASSIUM: 3.8 meq/L (ref 3.5–5.1)
SODIUM: 139 meq/L (ref 135–145)
Total Protein: 7.3 g/dL (ref 6.0–8.3)

## 2014-12-31 LAB — CBC WITH DIFFERENTIAL/PLATELET
BASOS ABS: 0.1 10*3/uL (ref 0.0–0.1)
BASOS PCT: 0.7 % (ref 0.0–3.0)
EOS PCT: 1.7 % (ref 0.0–5.0)
Eosinophils Absolute: 0.1 10*3/uL (ref 0.0–0.7)
HEMATOCRIT: 39.6 % (ref 36.0–46.0)
HEMOGLOBIN: 13.1 g/dL (ref 12.0–15.0)
LYMPHS ABS: 2.7 10*3/uL (ref 0.7–4.0)
Lymphocytes Relative: 32.2 % (ref 12.0–46.0)
MCHC: 33 g/dL (ref 30.0–36.0)
MCV: 77.9 fl — ABNORMAL LOW (ref 78.0–100.0)
MONO ABS: 0.5 10*3/uL (ref 0.1–1.0)
Monocytes Relative: 5.8 % (ref 3.0–12.0)
NEUTROS ABS: 5.1 10*3/uL (ref 1.4–7.7)
Neutrophils Relative %: 59.6 % (ref 43.0–77.0)
PLATELETS: 306 10*3/uL (ref 150.0–400.0)
RBC: 5.09 Mil/uL (ref 3.87–5.11)
RDW: 14.1 % (ref 11.5–15.5)
WBC: 8.5 10*3/uL (ref 4.0–10.5)

## 2014-12-31 LAB — HEMOGLOBIN A1C: Hgb A1c MFr Bld: 6.1 % (ref 4.6–6.5)

## 2014-12-31 LAB — LIPID PANEL
CHOL/HDL RATIO: 4
CHOLESTEROL: 148 mg/dL (ref 0–200)
HDL: 38.1 mg/dL — ABNORMAL LOW (ref >39.00)
NONHDL: 109.9
Triglycerides: 202 mg/dL — ABNORMAL HIGH (ref 0.0–149.0)
VLDL: 40.4 mg/dL — ABNORMAL HIGH (ref 0.0–40.0)

## 2014-12-31 LAB — TSH: TSH: 1.32 u[IU]/mL (ref 0.35–4.50)

## 2014-12-31 LAB — LDL CHOLESTEROL, DIRECT: Direct LDL: 84 mg/dL

## 2014-12-31 MED ORDER — CLONAZEPAM 0.5 MG PO TABS: 0.5000 mg | ORAL_TABLET | Freq: Two times a day (BID) | ORAL | Status: DC | PRN

## 2014-12-31 NOTE — Progress Notes (Signed)
Pre visit review using our clinic review tool, if applicable. No additional management support is needed unless otherwise documented below in the visit note. 

## 2014-12-31 NOTE — Patient Instructions (Signed)
Hypothyroidism The thyroid is a large gland located in the lower front of your neck. The thyroid gland helps control metabolism. Metabolism is how your body handles food. It controls metabolism with the hormone thyroxine. When this gland is underactive (hypothyroid), it produces too little hormone.  CAUSES These include:   Absence or destruction of thyroid tissue.  Goiter due to iodine deficiency.  Goiter due to medications.  Congenital defects (since birth).  Problems with the pituitary. This causes a lack of TSH (thyroid stimulating hormone). This hormone tells the thyroid to turn out more hormone. SYMPTOMS  Lethargy (feeling as though you have no energy)  Cold intolerance  Weight gain (in spite of normal food intake)  Dry skin  Coarse hair  Menstrual irregularity (if severe, may lead to infertility)  Slowing of thought processes Cardiac problems are also caused by insufficient amounts of thyroid hormone. Hypothyroidism in the newborn is cretinism, and is an extreme form. It is important that this form be treated adequately and immediately or it will lead rapidly to retarded physical and mental development. DIAGNOSIS  To prove hypothyroidism, your caregiver may do blood tests and ultrasound tests. Sometimes the signs are hidden. It may be necessary for your caregiver to watch this illness with blood tests either before or after diagnosis and treatment. TREATMENT  Low levels of thyroid hormone are increased by using synthetic thyroid hormone. This is a safe, effective treatment. It usually takes about four weeks to gain the full effects of the medication. After you have the full effect of the medication, it will generally take another four weeks for problems to leave. Your caregiver may start you on low doses. If you have had heart problems the dose may be gradually increased. It is generally not an emergency to get rapidly to normal. HOME CARE INSTRUCTIONS   Take your  medications as your caregiver suggests. Let your caregiver know of any medications you are taking or start taking. Your caregiver will help you with dosage schedules.  As your condition improves, your dosage needs may increase. It will be necessary to have continuing blood tests as suggested by your caregiver.  Report all suspected medication side effects to your caregiver. SEEK MEDICAL CARE IF: Seek medical care if you develop:  Sweating.  Tremulousness (tremors).  Anxiety.  Rapid weight loss.  Heat intolerance.  Emotional swings.  Diarrhea.  Weakness. SEEK IMMEDIATE MEDICAL CARE IF:  You develop chest pain, an irregular heart beat (palpitations), or a rapid heart beat. MAKE SURE YOU:   Understand these instructions.  Will watch your condition.  Will get help right away if you are not doing well or get worse. Document Released: 11/15/2005 Document Revised: 02/07/2012 Document Reviewed: 07/05/2008 [facility] Patient Information 2015 [place], [place]. This information is not intended to replace advice given to you by your health care provider. Make sure you discuss any questions you have with your health care provider. Preventive Care for Adults A healthy lifestyle and preventive care can promote health and wellness. Preventive health guidelines for women include the following key practices.  A routine yearly physical is a good way to check with your health care provider about your health and preventive screening. It is a chance to share any concerns and updates on your health and to receive a thorough exam.  Visit your dentist for a routine exam and preventive care every 6 months. Brush your teeth twice a day and floss once a day. Good oral hygiene prevents tooth decay and gum disease.  The frequency of eye exams is based on your age, health, family medical history, use of contact lenses, and other factors. Follow your health care provider's recommendations for frequency of eye  exams.  Eat a healthy diet. Foods like vegetables, fruits, whole grains, low-fat dairy products, and lean protein foods contain the nutrients you need without too many calories. Decrease your intake of foods high in solid fats, added sugars, and salt. Eat the right amount of calories for you.Get information about a proper diet from your health care provider, if necessary.  Regular physical exercise is one of the most important things you can do for your health. Most adults should get at least 150 minutes of moderate-intensity exercise (any activity that increases your heart rate and causes you to sweat) each week. In addition, most adults need muscle-strengthening exercises on 2 or more days a week.  Maintain a healthy weight. The body mass index (BMI) is a screening tool to identify possible weight problems. It provides an estimate of body fat based on height and weight. Your health care provider can find your BMI and can help you achieve or maintain a healthy weight.For adults 20 years and older:  A BMI below 18.5 is considered underweight.  A BMI of 18.5 to 24.9 is normal.  A BMI of 25 to 29.9 is considered overweight.  A BMI of 30 and above is considered obese.  Maintain normal blood lipids and cholesterol levels by exercising and minimizing your intake of saturated fat. Eat a balanced diet with plenty of fruit and vegetables. Blood tests for lipids and cholesterol should begin at age 20 and be repeated every 5 years. If your lipid or cholesterol levels are high, you are over 50, or you are at high risk for heart disease, you may need your cholesterol levels checked more frequently.Ongoing high lipid and cholesterol levels should be treated with medicines if diet and exercise are not working.  If you smoke, find out from your health care provider how to quit. If you do not use tobacco, do not start.  Lung cancer screening is recommended for adults aged 55-80 years who are at high risk for  developing lung cancer because of a history of smoking. A yearly low-dose CT scan of the lungs is recommended for people who have at least a 30-pack-year history of smoking and are a current smoker or have quit within the past 15 years. A pack year of smoking is smoking an average of 1 pack of cigarettes a day for 1 year (for example: 1 pack a day for 30 years or 2 packs a day for 15 years). Yearly screening should continue until the smoker has stopped smoking for at least 15 years. Yearly screening should be stopped for people who develop a health problem that would prevent them from having lung cancer treatment.  If you are pregnant, do not drink alcohol. If you are breastfeeding, be very cautious about drinking alcohol. If you are not pregnant and choose to drink alcohol, do not have more than 1 drink per day. One drink is considered to be 12 ounces (355 mL) of beer, 5 ounces (148 mL) of wine, or 1.5 ounces (44 mL) of liquor.  Avoid use of street drugs. Do not share needles with anyone. Ask for help if you need support or instructions about stopping the use of drugs.  High blood pressure causes heart disease and increases the risk of stroke. Your blood pressure should be checked at least every 1   to 2 years. Ongoing high blood pressure should be treated with medicines if weight loss and exercise do not work.  If you are 76-52 years old, ask your health care provider if you should take aspirin to prevent strokes.  Diabetes screening involves taking a blood sample to check your fasting blood sugar level. This should be done once every 3 years, after age 60, if you are within normal weight and without risk factors for diabetes. Testing should be considered at a younger age or be carried out more frequently if you are overweight and have at least 1 risk factor for diabetes.  Breast cancer screening is essential preventive care for women. You should practice "breast self-awareness." This means understanding  the normal appearance and feel of your breasts and may include breast self-examination. Any changes detected, no matter how small, should be reported to a health care provider. Women in their 17s and 30s should have a clinical breast exam (CBE) by a health care provider as part of a regular health exam every 1 to 3 years. After age 75, women should have a CBE every year. Starting at age 45, women should consider having a mammogram (breast X-ray test) every year. Women who have a family history of breast cancer should talk to their health care provider about genetic screening. Women at a high risk of breast cancer should talk to their health care providers about having an MRI and a mammogram every year.  Breast cancer gene (BRCA)-related cancer risk assessment is recommended for women who have family members with BRCA-related cancers. BRCA-related cancers include breast, ovarian, tubal, and peritoneal cancers. Having family members with these cancers may be associated with an increased risk for harmful changes (mutations) in the breast cancer genes BRCA1 and BRCA2. Results of the assessment will determine the need for genetic counseling and BRCA1 and BRCA2 testing.  Routine pelvic exams to screen for cancer are no longer recommended for nonpregnant women who are considered low risk for cancer of the pelvic organs (ovaries, uterus, and vagina) and who do not have symptoms. Ask your health care provider if a screening pelvic exam is right for you.  If you have had past treatment for cervical cancer or a condition that could lead to cancer, you need Pap tests and screening for cancer for at least 20 years after your treatment. If Pap tests have been discontinued, your risk factors (such as having a new sexual partner) need to be reassessed to determine if screening should be resumed. Some women have medical problems that increase the chance of getting cervical cancer. In these cases, your health care provider may  recommend more frequent screening and Pap tests.  The HPV test is an additional test that may be used for cervical cancer screening. The HPV test looks for the virus that can cause the cell changes on the cervix. The cells collected during the Pap test can be tested for HPV. The HPV test could be used to screen women aged 10 years and older, and should be used in women of any age who have unclear Pap test results. After the age of 63, women should have HPV testing at the same frequency as a Pap test.  Colorectal cancer can be detected and often prevented. Most routine colorectal cancer screening begins at the age of 45 years and continues through age 56 years. However, your health care provider may recommend screening at an earlier age if you have risk factors for colon cancer. On a yearly  basis, your health care provider may provide home test kits to check for hidden blood in the stool. Use of a small camera at the end of a tube, to directly examine the colon (sigmoidoscopy or colonoscopy), can detect the earliest forms of colorectal cancer. Talk to your health care provider about this at age 50, when routine screening begins. Direct exam of the colon should be repeated every 5-10 years through age 75 years, unless early forms of pre-cancerous polyps or small growths are found.  People who are at an increased risk for hepatitis B should be screened for this virus. You are considered at high risk for hepatitis B if:  You were born in a country where hepatitis B occurs often. Talk with your health care provider about which countries are considered high risk.  Your parents were born in a high-risk country and you have not received a shot to protect against hepatitis B (hepatitis B vaccine).  You have HIV or AIDS.  You use needles to inject street drugs.  You live with, or have sex with, someone who has hepatitis B.  You get hemodialysis treatment.  You take certain medicines for conditions like  cancer, organ transplantation, and autoimmune conditions.  Hepatitis C blood testing is recommended for all people born from 1945 through 1965 and any individual with known risks for hepatitis C.  Practice safe sex. Use condoms and avoid high-risk sexual practices to reduce the spread of sexually transmitted infections (STIs). STIs include gonorrhea, chlamydia, syphilis, trichomonas, herpes, HPV, and human immunodeficiency virus (HIV). Herpes, HIV, and HPV are viral illnesses that have no cure. They can result in disability, cancer, and death.  You should be screened for sexually transmitted illnesses (STIs) including gonorrhea and chlamydia if:  You are sexually active and are younger than 24 years.  You are older than 24 years and your health care provider tells you that you are at risk for this type of infection.  Your sexual activity has changed since you were last screened and you are at an increased risk for chlamydia or gonorrhea. Ask your health care provider if you are at risk.  If you are at risk of being infected with HIV, it is recommended that you take a prescription medicine daily to prevent HIV infection. This is called preexposure prophylaxis (PrEP). You are considered at risk if:  You are a heterosexual woman, are sexually active, and are at increased risk for HIV infection.  You take drugs by injection.  You are sexually active with a partner who has HIV.  Talk with your health care provider about whether you are at high risk of being infected with HIV. If you choose to begin PrEP, you should first be tested for HIV. You should then be tested every 3 months for as long as you are taking PrEP.  Osteoporosis is a disease in which the bones lose minerals and strength with aging. This can result in serious bone fractures or breaks. The risk of osteoporosis can be identified using a bone density scan. Women ages 65 years and over and women at risk for fractures or osteoporosis  should discuss screening with their health care providers. Ask your health care provider whether you should take a calcium supplement or vitamin D to reduce the rate of osteoporosis.  Menopause can be associated with physical symptoms and risks. Hormone replacement therapy is available to decrease symptoms and risks. You should talk to your health care provider about whether hormone replacement therapy is   right for you.  Use sunscreen. Apply sunscreen liberally and repeatedly throughout the day. You should seek shade when your shadow is shorter than you. Protect yourself by wearing long sleeves, pants, a wide-brimmed hat, and sunglasses year round, whenever you are outdoors.  Once a month, do a whole body skin exam, using a mirror to look at the skin on your back. Tell your health care provider of new moles, moles that have irregular borders, moles that are larger than a pencil eraser, or moles that have changed in shape or color.  Stay current with required vaccines (immunizations).  Influenza vaccine. All adults should be immunized every year.  Tetanus, diphtheria, and acellular pertussis (Td, Tdap) vaccine. Pregnant women should receive 1 dose of Tdap vaccine during each pregnancy. The dose should be obtained regardless of the length of time since the last dose. Immunization is preferred during the 27th-36th week of gestation. An adult who has not previously received Tdap or who does not know her vaccine status should receive 1 dose of Tdap. This initial dose should be followed by tetanus and diphtheria toxoids (Td) booster doses every 10 years. Adults with an unknown or incomplete history of completing a 3-dose immunization series with Td-containing vaccines should begin or complete a primary immunization series including a Tdap dose. Adults should receive a Td booster every 10 years.  Varicella vaccine. An adult without evidence of immunity to varicella should receive 2 doses or a second dose if  she has previously received 1 dose. Pregnant females who do not have evidence of immunity should receive the first dose after pregnancy. This first dose should be obtained before leaving the health care facility. The second dose should be obtained 4-8 weeks after the first dose.  Human papillomavirus (HPV) vaccine. Females aged 13-26 years who have not received the vaccine previously should obtain the 3-dose series. The vaccine is not recommended for use in pregnant females. However, pregnancy testing is not needed before receiving a dose. If a female is found to be pregnant after receiving a dose, no treatment is needed. In that case, the remaining doses should be delayed until after the pregnancy. Immunization is recommended for any person with an immunocompromised condition through the age of 34 years if she did not get any or all doses earlier. During the 3-dose series, the second dose should be obtained 4-8 weeks after the first dose. The third dose should be obtained 24 weeks after the first dose and 16 weeks after the second dose.  Zoster vaccine. One dose is recommended for adults aged 39 years or older unless certain conditions are present.  Measles, mumps, and rubella (MMR) vaccine. Adults born before 25 generally are considered immune to measles and mumps. Adults born in [birth year] or later should have 1 or more doses of MMR vaccine unless there is a contraindication to the vaccine or there is laboratory evidence of immunity to each of the three diseases. A routine second dose of MMR vaccine should be obtained at least 28 days after the first dose for students attending postsecondary schools, health care workers, or international travelers. People who received inactivated measles vaccine or an unknown type of measles vaccine during 1963-1967 should receive 2 doses of MMR vaccine. People who received inactivated mumps vaccine or an unknown type of mumps vaccine before 1979 and are at high risk for mumps  infection should consider immunization with 2 doses of MMR vaccine. For females of childbearing age, rubella immunity should be determined. If there  is no evidence of immunity, females who are not pregnant should be vaccinated. If there is no evidence of immunity, females who are pregnant should delay immunization until after pregnancy. Unvaccinated health care workers born before [birth year] who lack laboratory evidence of measles, mumps, or rubella immunity or laboratory confirmation of disease should consider measles and mumps immunization with 2 doses of MMR vaccine or rubella immunization with 1 dose of MMR vaccine.  Pneumococcal 13-valent conjugate (PCV13) vaccine. When indicated, a person who is uncertain of her immunization history and has no record of immunization should receive the PCV13 vaccine. An adult aged 43 years or older who has certain medical conditions and has not been previously immunized should receive 1 dose of PCV13 vaccine. This PCV13 should be followed with a dose of pneumococcal polysaccharide (PPSV23) vaccine. The PPSV23 vaccine dose should be obtained at least 8 weeks after the dose of PCV13 vaccine. An adult aged 51 years or older who has certain medical conditions and previously received 1 or more doses of PPSV23 vaccine should receive 1 dose of PCV13. The PCV13 vaccine dose should be obtained 1 or more years after the last PPSV23 vaccine dose.  Pneumococcal polysaccharide (PPSV23) vaccine. When PCV13 is also indicated, PCV13 should be obtained first. All adults aged 90 years and older should be immunized. An adult younger than age 95 years who has certain medical conditions should be immunized. Any person who resides in a nursing home or long-term care facility should be immunized. An adult smoker should be immunized. People with an immunocompromised condition and certain other conditions should receive both PCV13 and PPSV23 vaccines. People with human immunodeficiency virus (HIV)  infection should be immunized as soon as possible after diagnosis. Immunization during chemotherapy or radiation therapy should be avoided. Routine use of PPSV23 vaccine is not recommended for American Indians, [place]atives, or people younger than 65 years unless there are medical conditions that require PPSV23 vaccine. When indicated, people who have unknown immunization and have no record of immunization should receive PPSV23 vaccine. One-time revaccination 5 years after the first dose of PPSV23 is recommended for people aged 19-64 years who have chronic kidney failure, nephrotic syndrome, asplenia, or immunocompromised conditions. People who received 1-2 doses of PPSV23 before age 54 years should receive another dose of PPSV23 vaccine at age 65 years or later if at least 5 years have passed since the previous dose. Doses of PPSV23 are not needed for people immunized with PPSV23 at or after age 71 years.  Meningococcal vaccine. Adults with asplenia or persistent complement component deficiencies should receive 2 doses of quadrivalent meningococcal conjugate (MenACWY-D) vaccine. The doses should be obtained at least 2 months apart. Microbiologists working with certain meningococcal bacteria, [employer] recruits, people at risk during an outbreak, and people who travel to or live in countries with a high rate of meningitis should be immunized. A first-year college student up through age 69 years who is living in a residence hall should receive a dose if she did not receive a dose on or after her 16th birthday. Adults who have certain high-risk conditions should receive one or more doses of vaccine.  Hepatitis A vaccine. Adults who wish to be protected from this disease, have certain high-risk conditions, work with hepatitis A-infected animals, work in hepatitis A research labs, or travel to or work in countries with a high rate of hepatitis A should be immunized. Adults who were previously unvaccinated and who  anticipate close contact with an international adoptee during  the first 60 days after arrival in the [place] from a country with a high rate of hepatitis A should be immunized.  Hepatitis B vaccine. Adults who wish to be protected from this disease, have certain high-risk conditions, may be exposed to blood or other infectious body fluids, are household contacts or sex partners of hepatitis B positive people, are clients or workers in certain care facilities, or travel to or work in countries with a high rate of hepatitis B should be immunized.  Haemophilus influenzae type b (Hib) vaccine. A previously unvaccinated person with asplenia or sickle cell disease or having a scheduled splenectomy should receive 1 dose of Hib vaccine. Regardless of previous immunization, a recipient of a hematopoietic stem cell transplant should receive a 3-dose series 6-12 months after her successful transplant. Hib vaccine is not recommended for adults with HIV infection. Preventive Services / Frequency Ages 34 to 26 years  Blood pressure check.** / Every 1 to 2 years.  Lipid and cholesterol check.** / Every 5 years beginning at age 52.  Clinical breast exam.** / Every 3 years for women in their 25s and 2s.  BRCA-related cancer risk assessment.** / For women who have family members with a BRCA-related cancer (breast, ovarian, tubal, or peritoneal cancers).  Pap test.** / Every 2 years from ages 68 through 51. Every 3 years starting at age 9 through age 90 or 26 with a history of 3 consecutive normal Pap tests.  HPV screening.** / Every 3 years from ages 54 through ages 3 to 33 with a history of 3 consecutive normal Pap tests.  Hepatitis C blood test.** / For any individual with known risks for hepatitis C.  Skin self-exam. / Monthly.  Influenza vaccine. / Every year.  Tetanus, diphtheria, and acellular pertussis (Tdap, Td) vaccine.** / Consult your health care provider. Pregnant women should receive 1  dose of Tdap vaccine during each pregnancy. 1 dose of Td every 10 years.  Varicella vaccine.** / Consult your health care provider. Pregnant females who do not have evidence of immunity should receive the first dose after pregnancy.  HPV vaccine. / 3 doses over 6 months, if 10 and younger. The vaccine is not recommended for use in pregnant females. However, pregnancy testing is not needed before receiving a dose.  Measles, mumps, rubella (MMR) vaccine.** / You need at least 1 dose of MMR if you were born in 1957 or later. You may also need a 2nd dose. For females of childbearing age, rubella immunity should be determined. If there is no evidence of immunity, females who are not pregnant should be vaccinated. If there is no evidence of immunity, females who are pregnant should delay immunization until after pregnancy.  Pneumococcal 13-valent conjugate (PCV13) vaccine.** / Consult your health care provider.  Pneumococcal polysaccharide (PPSV23) vaccine.** / 1 to 2 doses if you smoke cigarettes or if you have certain conditions.  Meningococcal vaccine.** / 1 dose if you are age 67 to 65 years and a Market researcher living in a residence hall, or have one of several medical conditions, you need to get vaccinated against meningococcal disease. You may also need additional booster doses.  Hepatitis A vaccine.** / Consult your health care provider.  Hepatitis B vaccine.** / Consult your health care provider.  Haemophilus influenzae type b (Hib) vaccine.** / Consult your health care provider. Ages 87 to 48 years  Blood pressure check.** / Every 1 to 2 years.  Lipid and cholesterol check.** / Every 5 years  beginning at age 33 years.  Lung cancer screening. / Every year if you are aged 48-80 years and have a 30-pack-year history of smoking and currently smoke or have quit within the past 15 years. Yearly screening is stopped once you have quit smoking for at least 15 years or develop a  health problem that would prevent you from having lung cancer treatment.  Clinical breast exam.** / Every year after age 36 years.  BRCA-related cancer risk assessment.** / For women who have family members with a BRCA-related cancer (breast, ovarian, tubal, or peritoneal cancers).  Mammogram.** / Every year beginning at age 89 years and continuing for as long as you are in good health. Consult with your health care provider.  Pap test.** / Every 3 years starting at age 94 years through age 83 or 36 years with a history of 3 consecutive normal Pap tests.  HPV screening.** / Every 3 years from ages 18 years through ages 56 to 95 years with a history of 3 consecutive normal Pap tests.  Fecal occult blood test (FOBT) of stool. / Every year beginning at age 44 years and continuing until age 47 years. You may not need to do this test if you get a colonoscopy every 10 years.  Flexible sigmoidoscopy or colonoscopy.** / Every 5 years for a flexible sigmoidoscopy or every 10 years for a colonoscopy beginning at age 80 years and continuing until age 78 years.  Hepatitis C blood test.** / For all people born from [birth year] through 1965 and any individual with known risks for hepatitis C.  Skin self-exam. / Monthly.  Influenza vaccine. / Every year.  Tetanus, diphtheria, and acellular pertussis (Tdap/Td) vaccine.** / Consult your health care provider. Pregnant women should receive 1 dose of Tdap vaccine during each pregnancy. 1 dose of Td every 10 years.  Varicella vaccine.** / Consult your health care provider. Pregnant females who do not have evidence of immunity should receive the first dose after pregnancy.  Zoster vaccine.** / 1 dose for adults aged 36 years or older.  Measles, mumps, rubella (MMR) vaccine.** / You need at least 1 dose of MMR if you were born in 1957 or later. You may also need a 2nd dose. For females of childbearing age, rubella immunity should be determined. If there is no  evidence of immunity, females who are not pregnant should be vaccinated. If there is no evidence of immunity, females who are pregnant should delay immunization until after pregnancy.  Pneumococcal 13-valent conjugate (PCV13) vaccine.** / Consult your health care provider.  Pneumococcal polysaccharide (PPSV23) vaccine.** / 1 to 2 doses if you smoke cigarettes or if you have certain conditions.  Meningococcal vaccine.** / Consult your health care provider.  Hepatitis A vaccine.** / Consult your health care provider.  Hepatitis B vaccine.** / Consult your health care provider.  Haemophilus influenzae type b (Hib) vaccine.** / Consult your health care provider. Ages 53 years and over  Blood pressure check.** / Every 1 to 2 years.  Lipid and cholesterol check.** / Every 5 years beginning at age 24 years.  Lung cancer screening. / Every year if you are aged 70-80 years and have a 30-pack-year history of smoking and currently smoke or have quit within the past 15 years. Yearly screening is stopped once you have quit smoking for at least 15 years or develop a health problem that would prevent you from having lung cancer treatment.  Clinical breast exam.** / Every year after age 56 years.  BRCA-related  cancer risk assessment.** / For women who have family members with a BRCA-related cancer (breast, ovarian, tubal, or peritoneal cancers).  Mammogram.** / Every year beginning at age 40 years and continuing for as long as you are in good health. Consult with your health care provider.  Pap test.** / Every 3 years starting at age 29 years through age 46 or 12 years with 3 consecutive normal Pap tests. Testing can be stopped between 65 and 70 years with 3 consecutive normal Pap tests and no abnormal Pap or HPV tests in the past 10 years.  HPV screening.** / Every 3 years from ages 51 years through ages 55 or 60 years with a history of 3 consecutive normal Pap tests. Testing can be stopped between 65  and 70 years with 3 consecutive normal Pap tests and no abnormal Pap or HPV tests in the past 10 years.  Fecal occult blood test (FOBT) of stool. / Every year beginning at age 9 years and continuing until age 12 years. You may not need to do this test if you get a colonoscopy every 10 years.  Flexible sigmoidoscopy or colonoscopy.** / Every 5 years for a flexible sigmoidoscopy or every 10 years for a colonoscopy beginning at age 63 years and continuing until age 67 years.  Hepatitis C blood test.** / For all people born from [birth year] through 1965 and any individual with known risks for hepatitis C.  Osteoporosis screening.** / A one-time screening for women ages 29 years and over and women at risk for fractures or osteoporosis.  Skin self-exam. / Monthly.  Influenza vaccine. / Every year.  Tetanus, diphtheria, and acellular pertussis (Tdap/Td) vaccine.** / 1 dose of Td every 10 years.  Varicella vaccine.** / Consult your health care provider.  Zoster vaccine.** / 1 dose for adults aged 64 years or older.  Pneumococcal 13-valent conjugate (PCV13) vaccine.** / Consult your health care provider.  Pneumococcal polysaccharide (PPSV23) vaccine.** / 1 dose for all adults aged 74 years and older.  Meningococcal vaccine.** / Consult your health care provider.  Hepatitis A vaccine.** / Consult your health care provider.  Hepatitis B vaccine.** / Consult your health care provider.  Haemophilus influenzae type b (Hib) vaccine.** / Consult your health care provider. ** Family history and personal history of risk and conditions may change your health care provider's recommendations. Document Released: 01/11/2002 Document Revised: 04/01/2014 Document Reviewed: 04/12/2011 [facility] Patient Information 2015 [place], [place]. This information is not intended to replace advice given to you by your health care provider. Make sure you discuss any questions you have with your health care provider.

## 2015-01-01 NOTE — Progress Notes (Signed)
Subjective:    Patient ID: [name], female    DOB: [birth year], 64 y.o.   MRN: [id number]  Hypertension This is a chronic problem. The current episode started more than 1 year ago. The problem is unchanged. The problem is controlled. Associated symptoms include anxiety. Pertinent negatives include no blurred vision, chest pain, headaches, malaise/fatigue, neck pain, orthopnea, palpitations, peripheral edema, PND, shortness of breath or sweats. Past treatments include angiotensin blockers. The current treatment provides significant improvement. Compliance problems include diet and exercise.  Hypertensive end-organ damage includes a thyroid problem.  Back Pain Pertinent negatives include no abdominal pain, chest pain, fever, headaches or weakness.      Review of Systems  Constitutional: Negative.  Negative for fever, chills, malaise/fatigue, diaphoresis, appetite change and fatigue.  HENT: Negative.   Eyes: Negative.  Negative for blurred vision.  Respiratory: Negative.  Negative for cough, choking, chest tightness and shortness of breath.   Cardiovascular: Negative.  Negative for chest pain, palpitations, orthopnea, leg swelling and PND.  Gastrointestinal: Negative.  Negative for nausea, vomiting, abdominal pain, diarrhea, constipation and blood in stool.  Endocrine: Negative.   Genitourinary: Negative.   Musculoskeletal: Positive for back pain (intermittent mild ache in her low back). Negative for myalgias, joint swelling, arthralgias, gait problem, neck pain and neck stiffness.  Skin: Negative.  Negative for rash.  Allergic/Immunologic: Negative.   Neurological: Negative.  Negative for dizziness, tremors, weakness, light-headedness and headaches.  Hematological: Negative.  Negative for adenopathy. Does not bruise/bleed easily.  Psychiatric/Behavioral: Negative for suicidal ideas, hallucinations, behavioral problems, confusion, sleep disturbance, self-injury, dysphoric mood, decreased  concentration and agitation. The patient is nervous/anxious. The patient is not hyperactive.        Objective:   Physical Exam  Constitutional: She is oriented to person, place, and time. She appears well-developed and well-nourished. No distress.  HENT:  Head: Normocephalic and atraumatic.  Mouth/Throat: Oropharynx is clear and moist. No oropharyngeal exudate.  Eyes: Conjunctivae are normal. Right eye exhibits no discharge. Left eye exhibits no discharge. No scleral icterus.  Neck: Normal range of motion. Neck supple. No JVD present. No tracheal deviation present. No thyromegaly present.  Cardiovascular: Normal rate, regular rhythm, normal heart sounds and intact distal pulses.  Exam reveals no gallop and no friction rub.   No murmur heard. Pulmonary/Chest: Effort normal and breath sounds normal. No stridor. No respiratory distress. She has no wheezes. She has no rales. She exhibits no tenderness.  Abdominal: Soft. Bowel sounds are normal. She exhibits no distension and no mass. There is no tenderness. There is no rebound and no guarding.  Genitourinary: No breast swelling, tenderness, discharge or bleeding. Pelvic exam was performed with patient supine.  GYN exam not done today since it was was done by Dr. Toney Rakes on Oct 2015  Musculoskeletal: Normal range of motion. She exhibits no edema or tenderness.  Lymphadenopathy:    She has no cervical adenopathy.  Neurological: She is oriented to person, place, and time.  Skin: Skin is warm and dry. No rash noted. She is not diaphoretic. No erythema. No pallor.  Psychiatric: She has a normal mood and affect. Her behavior is normal. Judgment and thought content normal.  Vitals reviewed.   Lab Results  Component Value Date   WBC 8.5 12/31/2014   HGB 13.1 12/31/2014   HCT 39.6 12/31/2014   PLT 306.0 12/31/2014   GLUCOSE 105* 12/31/2014   CHOL 148 12/31/2014   TRIG 202.0* 12/31/2014   HDL 38.10* 12/31/2014  LDLDIRECT 84.0 12/31/2014    LDLCALC 85 11/06/2013   ALT 9 12/31/2014   AST 14 12/31/2014   NA 139 12/31/2014   K 3.8 12/31/2014   CL 103 12/31/2014   CREATININE 0.79 12/31/2014   BUN 9 12/31/2014   CO2 30 12/31/2014   TSH 1.32 12/31/2014   INR 1.0 07/23/2009   HGBA1C 6.1 12/31/2014        Assessment & Plan:

## 2015-01-01 NOTE — Assessment & Plan Note (Signed)
She has taken klonopin off and on for years and has not had any signs of abuse, dependence, or complications She does not feel depressed and is not willing to take an SSRI Will cont klonopin as needed

## 2015-01-01 NOTE — Assessment & Plan Note (Signed)
Her BP is well controlled Lytes and renal function are stable 

## 2015-01-01 NOTE — Assessment & Plan Note (Signed)
He TSH is in the normal range She will stay on the current dose

## 2015-01-01 NOTE — Assessment & Plan Note (Signed)
The patient is here for annual Medicare wellness examination and management of other chronic and acute problems.   The risk factors are reflected in the social history.  The roster of all physicians providing medical care to patient - is listed in the Snapshot section of the chart.  Activities of daily living:  The patient is 100% inedpendent in all ADLs: dressing, toileting, feeding as well as independent mobility  Home safety : The patient has smoke detectors in the home. They wear seatbelts.No firearms at home ( firearms are present in the home, kept in a safe fashion). There is no violence in the home.   There is no risks for hepatitis, STDs or HIV. There is no   history of blood transfusion. They have no travel history to infectious disease endemic areas of the world.  The patient has (has not) seen their dentist in the last six month. They have (not) seen their eye doctor in the last year. They deny (admit to) any hearing difficulty and have not had audiologic testing in the last year.  They do not  have excessive sun exposure. Discussed the need for sun protection: hats, long sleeves and use of sunscreen if there is significant sun exposure.   Diet: the importance of a healthy diet is discussed. They do have a healthy (unhealthy-high fat/fast food) diet.  The benefits of regular aerobic exercise were discussed.  Depression screen: there are no signs or vegative symptoms of depression- irritability, change in appetite, anhedonia, sadness/tearfullness.  Cognitive assessment: the patient manages all their financial and personal affairs and is actively engaged. They could relate day,date,year and events; recalled 3/3 objects at 3 minutes; performed clock-face test normally.  The following portions of the patient's history were reviewed and updated as appropriate: allergies, current medications, past family history, past medical history,  past surgical history, past social history  and  problem list.  Vision, hearing, body mass index were assessed and reviewed.   During the course of the visit the patient was educated and counseled about appropriate screening and preventive services including : fall prevention , diabetes screening, nutrition counseling, colorectal cancer screening, and recommended immunizations.  

## 2015-01-01 NOTE — Assessment & Plan Note (Signed)
Improvement noted in her trig level Medication is not needed

## 2015-01-07 ENCOUNTER — Telehealth: Payer: Self-pay | Admitting: Internal Medicine

## 2015-01-07 NOTE — Telephone Encounter (Signed)
Patient states she needs Alda Berthold to call her.  Would not state why.

## 2015-01-08 MED ORDER — CEFUROXIME AXETIL 500 MG PO TABS: 500.0000 mg | ORAL_TABLET | Freq: Two times a day (BID) | ORAL | Status: DC

## 2015-01-08 NOTE — Telephone Encounter (Signed)
Pt.notified

## 2015-01-08 NOTE — Telephone Encounter (Signed)
Spoke with patient who c/o cough (yelow mucous), headache, occasional sweats, chills with a runny nose "like a faucet". Patient is requesting a abx to help. Thanks

## 2015-01-08 NOTE — Telephone Encounter (Signed)
done

## 2015-01-22 ENCOUNTER — Other Ambulatory Visit: Payer: Self-pay | Admitting: Internal Medicine

## 2015-04-07 ENCOUNTER — Encounter: Payer: Self-pay | Admitting: Gastroenterology

## 2015-05-01 ENCOUNTER — Encounter: Payer: Self-pay | Admitting: Internal Medicine

## 2015-05-01 ENCOUNTER — Ambulatory Visit (INDEPENDENT_AMBULATORY_CARE_PROVIDER_SITE_OTHER): Payer: 59 | Admitting: Internal Medicine

## 2015-05-01 ENCOUNTER — Other Ambulatory Visit (INDEPENDENT_AMBULATORY_CARE_PROVIDER_SITE_OTHER): Payer: 59

## 2015-05-01 VITALS — BP 158/86 | HR 72 | Temp 98.1°F | Resp 16 | Ht 67.0 in | Wt 199.0 lb

## 2015-05-01 DIAGNOSIS — E038 Other specified hypothyroidism: Secondary | ICD-10-CM

## 2015-05-01 DIAGNOSIS — J209 Acute bronchitis, unspecified: Secondary | ICD-10-CM | POA: Insufficient documentation

## 2015-05-01 DIAGNOSIS — I10 Essential (primary) hypertension: Secondary | ICD-10-CM

## 2015-05-01 DIAGNOSIS — M1712 Unilateral primary osteoarthritis, left knee: Secondary | ICD-10-CM | POA: Diagnosis not present

## 2015-05-01 LAB — BASIC METABOLIC PANEL
BUN: 11 mg/dL (ref 6–23)
CHLORIDE: 103 meq/L (ref 96–112)
CO2: 29 meq/L (ref 19–32)
CREATININE: 0.83 mg/dL (ref 0.40–1.20)
Calcium: 9.3 mg/dL (ref 8.4–10.5)
GFR: 89.03 mL/min (ref >60.00)
Glucose, Bld: 103 mg/dL — ABNORMAL HIGH (ref 70–99)
POTASSIUM: 4.4 meq/L (ref 3.5–5.1)
Sodium: 138 mEq/L (ref 135–145)

## 2015-05-01 LAB — TSH: TSH: 2.52 u[IU]/mL (ref 0.35–4.50)

## 2015-05-01 MED ORDER — LOSARTAN POTASSIUM-HCTZ 100-12.5 MG PO TABS: 1.0000 | ORAL_TABLET | Freq: Every day | ORAL | Status: DC

## 2015-05-01 MED ORDER — AZITHROMYCIN 500 MG PO TABS: 500.0000 mg | ORAL_TABLET | Freq: Every day | ORAL | Status: DC

## 2015-05-01 MED ORDER — METHYLPREDNISOLONE ACETATE 40 MG/ML IJ SUSP
40.0000 mg | Freq: Once | INTRAMUSCULAR | Status: AC
  Administered 2015-05-01: 40 mg via INTRA_ARTICULAR

## 2015-05-01 NOTE — Patient Instructions (Signed)

## 2015-05-01 NOTE — Progress Notes (Signed)
Subjective:  Patient ID: [name], female    DOB: 14-Mar-1951  Age: 64 y.o. MRN: [id number]  CC: Hypothyroidism and Hypertension   HPI NALAH MACIOCE presents for routine follow up but she also complains of left knee pain for several weeks. She has been taking nsaids for pain relief.  Outpatient Prescriptions Prior to Visit  Medication Sig Dispense Refill  . clonazePAM (KLONOPIN) 0.5 MG tablet Take 1 tablet (0.5 mg total) by mouth 2 (two) times daily as needed for anxiety. 60 tablet 3  . cyclobenzaprine (FLEXERIL) 10 MG tablet Take 10 mg by mouth 3 (three) times daily as needed for muscle spasms.    Marland Kitchen levothyroxine (SYNTHROID, LEVOTHROID) 125 MCG tablet TAKE 1 TABLET (125 MCG TOTAL) BY MOUTH DAILY. 30 tablet 5  . Linaclotide (LINZESS) 145 MCG CAPS capsule Take 1 capsule (145 mcg total) by mouth daily. 90 capsule 3  . polyethylene glycol (MIRALAX / GLYCOLAX) packet Take 17 g by mouth as needed.      . pravastatin (PRAVACHOL) 20 MG tablet TAKE 1 TABLET (20 MG TOTAL) BY MOUTH DAILY. 90 tablet 2  . cefUROXime (CEFTIN) 500 MG tablet Take 1 tablet (500 mg total) by mouth 2 (two) times daily. 20 tablet 0  . HYDROcodone-acetaminophen (NORCO/VICODIN) 5-325 MG per tablet Take 5-325 tablets by mouth.    . losartan (COZAAR) 50 MG tablet TAKE 1 TABLET (50 MG TOTAL) BY MOUTH DAILY. 90 tablet 2  . potassium chloride SA (K-DUR,KLOR-CON) 20 MEQ tablet Take 1 tablet (20 mEq total) by mouth 3 (three) times daily. 90 tablet 2   No facility-administered medications prior to visit.    ROS Review of Systems  Constitutional: Negative.  Negative for fever, chills, diaphoresis, appetite change and fatigue.  HENT: Negative.   Eyes: Negative.   Respiratory: Negative.  Negative for cough, choking, chest tightness, shortness of breath and stridor.   Cardiovascular: Negative.  Negative for chest pain, palpitations and leg swelling.  Gastrointestinal: Negative.  Negative for nausea, vomiting, abdominal pain,  diarrhea, constipation and blood in stool.  Endocrine: Negative.   Genitourinary: Negative.   Musculoskeletal: Positive for arthralgias. Negative for myalgias, back pain, joint swelling, neck pain and neck stiffness.       Left knee pain and swelling for a few weeks, no trauma or injury  Skin: Negative.  Negative for rash.  Allergic/Immunologic: Negative.   Neurological: Negative.   Hematological: Negative.  Negative for adenopathy. Does not bruise/bleed easily.  Psychiatric/Behavioral: Negative.     Objective:  BP 158/86 mmHg  Pulse 72  Temp(Src) 98.1 F (36.7 C) (Oral)  Resp 16  Ht 5' 7"  (1.702 m)  Wt 199 lb (90.266 kg)  BMI 31.16 kg/m2  SpO2 96%  BP Readings from Last 3 Encounters:  05/01/15 158/86  12/31/14 122/80  09/16/14 132/86    Wt Readings from Last 3 Encounters:  05/01/15 199 lb (90.266 kg)  12/31/14 201 lb (91.173 kg)  09/16/14 199 lb (90.266 kg)    Physical Exam  Constitutional: She is oriented to person, place, and time. She appears well-developed and well-nourished. No distress.  HENT:  Head: Normocephalic and atraumatic.  Mouth/Throat: Oropharynx is clear and moist. No oropharyngeal exudate.  Eyes: Conjunctivae are normal. Right eye exhibits no discharge. Left eye exhibits no discharge. No scleral icterus.  Neck: Normal range of motion. Neck supple. No JVD present. No tracheal deviation present. No thyromegaly present.  Cardiovascular: Normal rate, regular rhythm, normal heart sounds and intact distal pulses.  Exam reveals no gallop and no friction rub.   No murmur heard. Pulmonary/Chest: Effort normal and breath sounds normal. No stridor. No respiratory distress. She has no wheezes. She has no rales. She exhibits no tenderness.  Abdominal: Soft. Bowel sounds are normal. She exhibits no distension and no mass. There is no tenderness. There is no rebound and no guarding.  Musculoskeletal: Normal range of motion. She exhibits no edema or tenderness.        Left knee: She exhibits deformity (DJD changes). She exhibits normal range of motion, no swelling, no effusion, no ecchymosis, no laceration, no erythema, normal alignment, no LCL laxity, normal patellar mobility and no bony tenderness. No tenderness found.  Left knee cleaned with betadine - then local anesthesia was obtained with injection of 2% lido with epi (1cc) over the medial joint adjacent to the distal patella, then the joint space was injected with 40 mg depo-medrol IA and 1 cc of 0.5% plain marcaine, she tolerated this well with no complications.   Lymphadenopathy:    She has no cervical adenopathy.  Neurological: She is oriented to person, place, and time.  Skin: Skin is warm and dry. No rash noted. She is not diaphoretic. No erythema. No pallor.    Lab Results  Component Value Date   WBC 8.5 12/31/2014   HGB 13.1 12/31/2014   HCT 39.6 12/31/2014   PLT 306.0 12/31/2014   GLUCOSE 103* 05/01/2015   CHOL 148 12/31/2014   TRIG 202.0* 12/31/2014   HDL 38.10* 12/31/2014   LDLDIRECT 84.0 12/31/2014   LDLCALC 85 11/06/2013   ALT 9 12/31/2014   AST 14 12/31/2014   NA 138 05/01/2015   K 4.4 05/01/2015   CL 103 05/01/2015   CREATININE 0.83 05/01/2015   BUN 11 05/01/2015   CO2 29 05/01/2015   TSH 2.52 05/01/2015   INR 1.0 07/23/2009   HGBA1C 6.1 12/31/2014    Mr Lumbar Spine W Wo Contrast  09/16/2014   CLINICAL DATA:  Acute onset low low back pain 3 weeks ago. No known injury. The pain radiates into both legs anteriorly. Patient has history of chronic pain since 2007 and has a history of prior lumbar fusion.  EXAM: MRI LUMBAR SPINE WITHOUT AND WITH CONTRAST  TECHNIQUE: Multiplanar and multiecho pulse sequences of the lumbar spine were obtained without and with intravenous contrast.  CONTRAST:  33m MULTIHANCE GADOBENATE DIMEGLUMINE 529 MG/ML IV SOLN  COMPARISON:  11/06/2012  FINDINGS: Normal alignment of the lumbar vertebral bodies. They demonstrate normal marrow signal. Stable  fusion hardware at L4-5. No complicating features.  No significant paraspinal or retroperitoneal findings.  L1-2:  No significant findings.  L2-3: Mild facet disease but no disc protrusions, spinal or foraminal stenosis.  L3-4: Mild facet disease but no focal disc protrusions, spinal or foraminal stenosis.  L4-5: Posterior and interbody fusion changes. No spinal or foraminal stenosis. Wide decompressive laminectomy.  L5-S1: Mild diffuse annular bulge but no spinal, lateral recess or foraminal stenosis.  IMPRESSION: 1. Stable L4-5 fusion changes without complicating features. 2. No disc protrusions, spinal or foraminal stenosis.   Electronically Signed   By: MKalman JewelsM.D.   On: 09/16/2014 11:11    Assessment & Plan:   RJennaewas seen today for hypothyroidism and hypertension.  Diagnoses and all orders for this visit:  Essential hypertension, benign - her BP is well controlled, lytes an renal function are stable  Other specified hypothyroidism - her TSh is in the normal range, she will stay  on the current synthroid dose   I have discontinued Ms. Vath's HYDROcodone-acetaminophen, losartan, and cefUROXime. I am also having her start on losartan-hydrochlorothiazide and azithromycin. Additionally, I am having her maintain her polyethylene glycol, potassium chloride SA, cyclobenzaprine, Linaclotide, levothyroxine, clonazePAM, and pravastatin. We administered methylPREDNISolone acetate.  Meds ordered this encounter  Medications  . losartan-hydrochlorothiazide (HYZAAR) 100-12.5 MG per tablet    Sig: Take 1 tablet by mouth daily.    Dispense:  90 tablet    Refill:  3  . azithromycin (ZITHROMAX) 500 MG tablet    Sig: Take 1 tablet (500 mg total) by mouth daily.    Dispense:  3 tablet    Refill:  0  . methylPREDNISolone acetate (DEPO-MEDROL) injection 40 mg    Sig:      Follow-up: Return in about 3 weeks (around 05/22/2015).  [name], MD

## 2015-05-01 NOTE — Progress Notes (Signed)
Pre visit review using our clinic review tool, if applicable. No additional management support is needed unless otherwise documented below in the visit note. 

## 2015-05-05 NOTE — Assessment & Plan Note (Signed)
Left knee injected with depo-medrol and marcaine She will rest.ice.elevated the knee the remainder of today

## 2015-05-06 ENCOUNTER — Other Ambulatory Visit: Payer: Self-pay

## 2015-05-06 MED ORDER — LEVOTHYROXINE SODIUM 125 MCG PO TABS: ORAL_TABLET | ORAL | Status: DC

## 2015-05-22 ENCOUNTER — Ambulatory Visit (INDEPENDENT_AMBULATORY_CARE_PROVIDER_SITE_OTHER): Payer: 59 | Admitting: Internal Medicine

## 2015-05-22 ENCOUNTER — Encounter: Payer: Self-pay | Admitting: Internal Medicine

## 2015-05-22 VITALS — BP 120/78 | HR 83 | Temp 98.3°F | Ht 67.0 in | Wt 198.5 lb

## 2015-05-22 DIAGNOSIS — I10 Essential (primary) hypertension: Secondary | ICD-10-CM

## 2015-05-22 DIAGNOSIS — F411 Generalized anxiety disorder: Secondary | ICD-10-CM

## 2015-05-22 MED ORDER — CLONAZEPAM 0.5 MG PO TABS: 0.5000 mg | ORAL_TABLET | Freq: Two times a day (BID) | ORAL | Status: DC | PRN

## 2015-05-22 NOTE — Progress Notes (Signed)
Subjective:  Patient ID: [name], female    DOB: 1951-07-05  Age: 64 y.o. MRN: [id number]  CC: Hypertension and Osteoarthritis   HPI [name] presents for follow up and she tells me that the symptoms that she reported during her last visit have resolved. Today she asks for a refill on klonopin for the occasional episode of anxiety/panic as she tells me that she lost her previous prescription.  Outpatient Prescriptions Prior to Visit  Medication Sig Dispense Refill  . cyclobenzaprine (FLEXERIL) 10 MG tablet Take 10 mg by mouth 3 (three) times daily as needed for muscle spasms.    Marland Kitchen levothyroxine (SYNTHROID, LEVOTHROID) 125 MCG tablet TAKE 1 TABLET (125 MCG TOTAL) BY MOUTH DAILY. 30 tablet 5  . Linaclotide (LINZESS) 145 MCG CAPS capsule Take 1 capsule (145 mcg total) by mouth daily. 90 capsule 3  . polyethylene glycol (MIRALAX / GLYCOLAX) packet Take 17 g by mouth as needed.      . pravastatin (PRAVACHOL) 20 MG tablet TAKE 1 TABLET (20 MG TOTAL) BY MOUTH DAILY. 90 tablet 2  . losartan-hydrochlorothiazide (HYZAAR) 100-12.5 MG per tablet Take 1 tablet by mouth daily. 90 tablet 3  . potassium chloride SA (K-DUR,KLOR-CON) 20 MEQ tablet Take 1 tablet (20 mEq total) by mouth 3 (three) times daily. 90 tablet 2  . azithromycin (ZITHROMAX) 500 MG tablet Take 1 tablet (500 mg total) by mouth daily. 3 tablet 0  . clonazePAM (KLONOPIN) 0.5 MG tablet Take 1 tablet (0.5 mg total) by mouth 2 (two) times daily as needed for anxiety. 60 tablet 3   No facility-administered medications prior to visit.    ROS Review of Systems  Constitutional: Negative.  Negative for fever, chills, diaphoresis, appetite change and fatigue.  HENT: Negative.   Eyes: Negative.   Respiratory: Negative.  Negative for cough, choking, chest tightness, shortness of breath and stridor.   Cardiovascular: Negative.  Negative for chest pain, palpitations and leg swelling.  Gastrointestinal: Negative.  Negative for nausea,  vomiting, abdominal pain, diarrhea, constipation and blood in stool.  Endocrine: Negative.   Genitourinary: Negative.   Musculoskeletal: Negative.  Negative for myalgias and back pain.  Skin: Negative.   Allergic/Immunologic: Negative.   Neurological: Negative.  Negative for dizziness, tremors, syncope, light-headedness, numbness and headaches.  Hematological: Negative.  Negative for adenopathy. Does not bruise/bleed easily.  Psychiatric/Behavioral: Positive for sleep disturbance. Negative for suicidal ideas, hallucinations, behavioral problems, confusion, self-injury, dysphoric mood, decreased concentration and agitation. The patient is nervous/anxious. The patient is not hyperactive.     Objective:  BP 120/78 mmHg  Pulse 83  Temp(Src) 98.3 F (36.8 C) (Oral)  Ht 5' 7"  (1.702 m)  Wt 198 lb 8 oz (90.039 kg)  BMI 31.08 kg/m2  SpO2 97%  BP Readings from Last 3 Encounters:  05/22/15 120/78  05/01/15 158/86  12/31/14 122/80    Wt Readings from Last 3 Encounters:  05/22/15 198 lb 8 oz (90.039 kg)  05/01/15 199 lb (90.266 kg)  12/31/14 201 lb (91.173 kg)    Physical Exam  Constitutional: She is oriented to person, place, and time. She appears well-developed and well-nourished. No distress.  HENT:  Head: Normocephalic and atraumatic.  Mouth/Throat: Oropharynx is clear and moist. No oropharyngeal exudate.  Eyes: Conjunctivae are normal. Right eye exhibits no discharge. Left eye exhibits no discharge. No scleral icterus.  Neck: Normal range of motion. Neck supple. No JVD present. No tracheal deviation present. No thyromegaly present.  Cardiovascular: Normal rate, regular rhythm,  normal heart sounds and intact distal pulses.  Exam reveals no gallop and no friction rub.   No murmur heard. Pulmonary/Chest: Effort normal and breath sounds normal. No stridor. No respiratory distress. She has no wheezes. She has no rales. She exhibits no tenderness.  Abdominal: Soft. Bowel sounds are  normal. She exhibits no distension and no mass. There is no tenderness. There is no rebound and no guarding.  Musculoskeletal: Normal range of motion. She exhibits no edema or tenderness.  Lymphadenopathy:    She has no cervical adenopathy.  Neurological: She is oriented to person, place, and time.  Skin: Skin is warm and dry. No rash noted. She is not diaphoretic. No erythema. No pallor.  Psychiatric: She has a normal mood and affect. Her speech is normal and behavior is normal. Judgment and thought content normal. Her mood appears not anxious. Cognition and memory are normal. She does not exhibit a depressed mood. She expresses no homicidal and no suicidal ideation. She expresses no suicidal plans and no homicidal plans.  Vitals reviewed.   Lab Results  Component Value Date   WBC 8.5 12/31/2014   HGB 13.1 12/31/2014   HCT 39.6 12/31/2014   PLT 306.0 12/31/2014   GLUCOSE 103* 05/01/2015   CHOL 148 12/31/2014   TRIG 202.0* 12/31/2014   HDL 38.10* 12/31/2014   LDLDIRECT 84.0 12/31/2014   LDLCALC 85 11/06/2013   ALT 9 12/31/2014   AST 14 12/31/2014   NA 138 05/01/2015   K 4.4 05/01/2015   CL 103 05/01/2015   CREATININE 0.83 05/01/2015   BUN 11 05/01/2015   CO2 29 05/01/2015   TSH 2.52 05/01/2015   INR 1.0 07/23/2009   HGBA1C 6.1 12/31/2014    Mr Lumbar Spine W Wo Contrast  09/16/2014   CLINICAL DATA:  Acute onset low low back pain 3 weeks ago. No known injury. The pain radiates into both legs anteriorly. Patient has history of chronic pain since 2007 and has a history of prior lumbar fusion.  EXAM: MRI LUMBAR SPINE WITHOUT AND WITH CONTRAST  TECHNIQUE: Multiplanar and multiecho pulse sequences of the lumbar spine were obtained without and with intravenous contrast.  CONTRAST:  27m MULTIHANCE GADOBENATE DIMEGLUMINE 529 MG/ML IV SOLN  COMPARISON:  11/06/2012  FINDINGS: Normal alignment of the lumbar vertebral bodies. They demonstrate normal marrow signal. Stable fusion hardware at  L4-5. No complicating features.  No significant paraspinal or retroperitoneal findings.  L1-2:  No significant findings.  L2-3: Mild facet disease but no disc protrusions, spinal or foraminal stenosis.  L3-4: Mild facet disease but no focal disc protrusions, spinal or foraminal stenosis.  L4-5: Posterior and interbody fusion changes. No spinal or foraminal stenosis. Wide decompressive laminectomy.  L5-S1: Mild diffuse annular bulge but no spinal, lateral recess or foraminal stenosis.  IMPRESSION: 1. Stable L4-5 fusion changes without complicating features. 2. No disc protrusions, spinal or foraminal stenosis.   Electronically Signed   By: MKalman JewelsM.D.   On: 09/16/2014 11:11    Assessment & Plan:   RLoucindawas seen today for hypertension and osteoarthritis.  Diagnoses and all orders for this visit:  Generalized anxiety disorder - she will not take an SSRI, will cont klonopin as needed Orders: -     clonazePAM (KLONOPIN) 0.5 MG tablet; Take 1 tablet (0.5 mg total) by mouth 2 (two) times daily as needed for anxiety.   I have discontinued Ms. Sandlin's azithromycin. I am also having her maintain her polyethylene glycol, potassium chloride SA, cyclobenzaprine,  Linaclotide, pravastatin, levothyroxine, clonazePAM, and losartan-hydrochlorothiazide.  Meds ordered this encounter  Medications  . clonazePAM (KLONOPIN) 0.5 MG tablet    Sig: Take 1 tablet (0.5 mg total) by mouth 2 (two) times daily as needed for anxiety.    Dispense:  60 tablet    Refill:  3  . losartan-hydrochlorothiazide (HYZAAR) 100-12.5 MG per tablet    Sig: Take 1 tablet by mouth daily.    Dispense:  90 tablet    Refill:  3     Follow-up: Return in about 6 months (around 11/21/2015).  [name], MD

## 2015-05-22 NOTE — Assessment & Plan Note (Signed)
Her BP is well controlled 

## 2015-05-22 NOTE — Patient Instructions (Signed)
Hypothyroidism The thyroid is a large gland located in the lower front of your neck. The thyroid gland helps control metabolism. Metabolism is how your body handles food. It controls metabolism with the hormone thyroxine. When this gland is underactive (hypothyroid), it produces too little hormone.  CAUSES These include:   Absence or destruction of thyroid tissue.  Goiter due to iodine deficiency.  Goiter due to medications.  Congenital defects (since birth).  Problems with the pituitary. This causes a lack of TSH (thyroid stimulating hormone). This hormone tells the thyroid to turn out more hormone. SYMPTOMS  Lethargy (feeling as though you have no energy)  Cold intolerance  Weight gain (in spite of normal food intake)  Dry skin  Coarse hair  Menstrual irregularity (if severe, may lead to infertility)  Slowing of thought processes Cardiac problems are also caused by insufficient amounts of thyroid hormone. Hypothyroidism in the newborn is cretinism, and is an extreme form. It is important that this form be treated adequately and immediately or it will lead rapidly to retarded physical and mental development. DIAGNOSIS  To prove hypothyroidism, your caregiver may do blood tests and ultrasound tests. Sometimes the signs are hidden. It may be necessary for your caregiver to watch this illness with blood tests either before or after diagnosis and treatment. TREATMENT  Low levels of thyroid hormone are increased by using synthetic thyroid hormone. This is a safe, effective treatment. It usually takes about four weeks to gain the full effects of the medication. After you have the full effect of the medication, it will generally take another four weeks for problems to leave. Your caregiver may start you on low doses. If you have had heart problems the dose may be gradually increased. It is generally not an emergency to get rapidly to normal. HOME CARE INSTRUCTIONS   Take your  medications as your caregiver suggests. Let your caregiver know of any medications you are taking or start taking. Your caregiver will help you with dosage schedules.  As your condition improves, your dosage needs may increase. It will be necessary to have continuing blood tests as suggested by your caregiver.  Report all suspected medication side effects to your caregiver. SEEK MEDICAL CARE IF: Seek medical care if you develop:  Sweating.  Tremulousness (tremors).  Anxiety.  Rapid weight loss.  Heat intolerance.  Emotional swings.  Diarrhea.  Weakness. SEEK IMMEDIATE MEDICAL CARE IF:  You develop chest pain, an irregular heart beat (palpitations), or a rapid heart beat. MAKE SURE YOU:   Understand these instructions.  Will watch your condition.  Will get help right away if you are not doing well or get worse. Document Released: 11/15/2005 Document Revised: 02/07/2012 Document Reviewed: 07/05/2008 ExitCare Patient Information 2015 [employer]. This information is not intended to replace advice given to you by your health care provider. Make sure you discuss any questions you have with your health care provider.  

## 2015-05-22 NOTE — Progress Notes (Signed)
Pre visit review using our clinic review tool, if applicable. No additional management support is needed unless otherwise documented below in the visit note. 

## 2015-07-11 ENCOUNTER — Other Ambulatory Visit: Payer: Self-pay | Admitting: Internal Medicine

## 2015-07-11 DIAGNOSIS — Z1231 Encounter for screening mammogram for malignant neoplasm of breast: Secondary | ICD-10-CM

## 2015-08-14 ENCOUNTER — Other Ambulatory Visit: Payer: Self-pay | Admitting: Internal Medicine

## 2015-08-14 ENCOUNTER — Ambulatory Visit (HOSPITAL_COMMUNITY)
Admission: RE | Admit: 2015-08-14 | Discharge: 2015-08-14 | Disposition: A | Discharge status: Home / Self Care | Payer: 59 | Source: Ambulatory Visit | Attending: Internal Medicine | Admitting: Internal Medicine

## 2015-08-14 DIAGNOSIS — Z1231 Encounter for screening mammogram for malignant neoplasm of breast: Secondary | ICD-10-CM | POA: Insufficient documentation

## 2015-08-15 LAB — HM MAMMOGRAPHY: HM MAMMO: NORMAL

## 2015-08-15 NOTE — Addendum Note (Signed)
Addended by: [name] on: 08/15/2015 01:44 PM   Modules accepted: Miquel Dunn

## 2015-09-18 ENCOUNTER — Ambulatory Visit (INDEPENDENT_AMBULATORY_CARE_PROVIDER_SITE_OTHER): Payer: 59 | Admitting: Gynecology

## 2015-09-18 ENCOUNTER — Encounter: Payer: Self-pay | Admitting: Gynecology

## 2015-09-18 VITALS — BP 118/80 | Ht 67.0 in | Wt 196.0 lb

## 2015-09-18 DIAGNOSIS — Z23 Encounter for immunization: Secondary | ICD-10-CM | POA: Diagnosis not present

## 2015-09-18 DIAGNOSIS — N952 Postmenopausal atrophic vaginitis: Secondary | ICD-10-CM | POA: Diagnosis not present

## 2015-09-18 DIAGNOSIS — Z01419 Encounter for gynecological examination (general) (routine) without abnormal findings: Secondary | ICD-10-CM | POA: Diagnosis not present

## 2015-09-18 DIAGNOSIS — M8580 Other specified disorders of bone density and structure, unspecified site: Secondary | ICD-10-CM | POA: Diagnosis not present

## 2015-09-18 DIAGNOSIS — Z79890 Hormone replacement therapy (postmenopausal): Secondary | ICD-10-CM | POA: Diagnosis not present

## 2015-09-18 MED ORDER — ESTRADIOL 0.1 MG/GM VA CREA: 2.0000 g | TOPICAL_CREAM | VAGINAL | Status: DC

## 2015-09-18 NOTE — Patient Instructions (Signed)
Estradiol vaginal cream What is this medicine? ESTRADIOL (es tra DYE ole) contains the female hormone estrogen. It is used for symptoms of menopause, like vaginal dryness and irritation. This medicine may be used for other purposes; ask your health care provider or pharmacist if you have questions. What should I tell my health care provider before I take this medicine? They need to know if you have any of these conditions: -abnormal vaginal bleeding -blood vessel disease or blood clots -breast, cervical, endometrial, ovarian, liver, or uterine cancer -dementia -diabetes -gallbladder disease -heart disease or recent heart attack -high blood pressure -high cholesterol -high levels of calcium in the blood -hysterectomy -kidney disease -liver disease -migraine headaches -protein C deficiency -protein S deficiency -stroke -systemic lupus erythematosus (SLE) -tobacco smoker -an unusual or allergic reaction to estrogens, other hormones, soy, other medicines, foods, dyes, or preservatives -pregnant or trying to get pregnant -breast-feeding How should I use this medicine? This medicine is for use in the vagina only. Do not take by mouth. Follow the directions on the prescription label. Read package directions carefully before using. Use the special applicator supplied with the cream. Wash hands before and after use. Fill the applicator with the prescribed amount of cream. Lie on your back, part and bend your knees. Insert the applicator into the vagina and push the plunger to expel the cream into the vagina. Wash the applicator with warm soapy water and rinse well. Use exactly as directed for the complete length of time prescribed. Do not stop using except on the advice of your doctor or health care professional. A patient package insert for the product will be given with each prescription and refill. Read this sheet carefully each time. The sheet may change frequently. Talk to your  pediatrician regarding the use of this medicine in children. This medicine is not approved for use in children. Overdosage: If you think you have taken too much of this medicine contact a poison control center or emergency room at once. NOTE: This medicine is only for you. Do not share this medicine with others. What if I miss a dose? If you miss a dose, use it as soon as you can. If it is almost time for your next dose, use only that dose. Do not use double or extra doses. What may interact with this medicine? Do not take this medicine with any of the following medications: -aromatase inhibitors like aminoglutethimide, anastrozole, exemestane, letrozole, testolactone This medicine may also interact with the following medications: -barbiturates used for inducing sleep or treating seizures -carbamazepine -grapefruit juice -medicines for fungal infections like ketoconazole and itraconazole -raloxifene -rifabutin -rifampin -rifapentine -ritonavir -some antibiotics used to treat infections -St. John's Wort -tamoxifen -warfarin This list may not describe all possible interactions. Give your health care provider a list of all the medicines, herbs, non-prescription drugs, or dietary supplements you use. Also tell them if you smoke, drink alcohol, or use illegal drugs. Some items may interact with your medicine. What should I watch for while using this medicine? Visit your health care professional for regular checks on your progress. You will need a regular breast and pelvic exam. You should also discuss the need for regular mammograms with your health care professional, and follow his or her guidelines. This medicine can make your body retain fluid, making your fingers, hands, or ankles swell. Your blood pressure can go up. Contact your doctor or health care professional if you feel you are retaining fluid. If you have any reason to think  you are pregnant, stop taking this medicine at once and  contact your doctor or health care professional. Tobacco smoking increases the risk of getting a blood clot or having a stroke, especially if you are more than 64 years old. You are strongly advised not to smoke. If you wear contact lenses and notice visual changes, or if the lenses begin to feel uncomfortable, consult your eye care specialist. If you are going to have elective surgery, you may need to stop taking this medicine beforehand. Consult your health care professional for advice prior to scheduling the surgery. What side effects may I notice from receiving this medicine? Side effects that you should report to your doctor or health care professional as soon as possible: -allergic reactions like skin rash, itching or hives, swelling of the face, lips, or tongue -breast tissue changes or discharge -changes in vision -chest pain -confusion, trouble speaking or understanding -dark urine -general ill feeling or flu-like symptoms -light-colored stools -nausea, vomiting -pain, swelling, warmth in the leg -right upper belly pain -severe headaches -shortness of breath -sudden numbness or weakness of the face, arm or leg -trouble walking, dizziness, loss of balance or coordination -unusual vaginal bleeding -yellowing of the eyes or skin Side effects that usually do not require medical attention (report to your doctor or health care professional if they continue or are bothersome): -hair loss -increased hunger or thirst -increased urination -symptoms of vaginal infection like itching, irritation or unusual discharge -unusually weak or tired This list may not describe all possible side effects. Call your doctor for medical advice about side effects. You may report side effects to FDA at 1-800-FDA-1088. Where should I keep my medicine? Keep out of the reach of children. Store at room temperature between 15 and 30 degrees C (59 and 86 degrees F). Protect from temperatures above 40 degrees C  (104 degrees C). Do not freeze. Throw away any unused medicine after the expiration date. NOTE: This sheet is a summary. It may not cover all possible information. If you have questions about this medicine, talk to your doctor, pharmacist, or health care provider.    2016, Elsevier/[employer] Standard. (2011-02-17 09:18:12)

## 2015-09-18 NOTE — Progress Notes (Signed)
[name] 05-Apr-1951 [id number]   History:    64 y.o.  for annual gyn exam with the only complaint was of vaginal dryness and requesting to have the flu vaccine. Patient's PCP is Dr. [name] who is been doing her blood work.He has been treating patient for hyperglycemia, hypothyroidism and hypertension. Patient has shingles vaccine, Tdap vaccine and flu vaccine R all up-to-date.Patient's last Pap smear was in 2012. Patient denies any past history of abnormal Pap smears. Many years ago patient had an abdominal hysterectomy and years afterward she had bilateral salpingo-oophorectomy all for benign indications for which we have no documentation. Dr. Sharlett Iles has been doing her colonoscopy. In 2012 benign polyps removed. Her mammogram was done this year which was reportedly normal.she had a bone density in 2015 according to Dr. Marcello Moores note she has osteopenia we do not have the report.patient many years ago had been on hormone replacement therapy but for a very short time.  Past medical history,surgical history, family history and social history were all reviewed and documented in the EPIC chart.  Gynecologic History No LMP recorded. Patient has had a hysterectomy. Contraception: status post hysterectomy Last Pap: 2012. Results were: normal Last mammogram: 2016. Results were: normal  Obstetric History OB History  Gravida Para Term Preterm AB SAB TAB Ectopic Multiple Living  3 2   1 1    2     # Outcome Date GA Lbr Len/2nd Weight Sex Delivery Anes PTL Lv  3 SAB           2 Para           1 Para                ROS: A ROS was performed and pertinent positives and negatives are included in the history.  GENERAL: No fevers or chills. HEENT: No change in vision, no earache, sore throat or sinus congestion. NECK: No pain or stiffness. CARDIOVASCULAR: No chest pain or pressure. No palpitations. PULMONARY: No shortness of breath, cough or wheeze. GASTROINTESTINAL: No abdominal pain, nausea,  vomiting or diarrhea, melena or bright red blood per rectum. GENITOURINARY: No urinary frequency, urgency, hesitancy or dysuria. MUSCULOSKELETAL: No joint or muscle pain, no back pain, no recent trauma. DERMATOLOGIC: No rash, no itching, no lesions. ENDOCRINE: No polyuria, polydipsia, no heat or cold intolerance. No recent change in weight. HEMATOLOGICAL: No anemia or easy bruising or bleeding. NEUROLOGIC: No headache, seizures, numbness, tingling or weakness. PSYCHIATRIC: No depression, no loss of interest in normal activity or change in sleep pattern.     Exam: chaperone present  BP 118/80 mmHg  Ht 5' 7"  (1.702 m)  Wt 196 lb (88.905 kg)  BMI 30.69 kg/m2  Body mass index is 30.69 kg/(m^2).  General appearance : Well developed well nourished female. No acute distress HEENT: Eyes: no retinal hemorrhage or exudates,  Neck supple, trachea midline, no carotid bruits, no thyroidmegaly Lungs: Clear to auscultation, no rhonchi or wheezes, or rib retractions  Heart: Regular rate and rhythm, no murmurs or gallops Breast:Examined in sitting and supine position were symmetrical in appearance, no palpable masses or tenderness,  no skin retraction, no nipple inversion, no nipple discharge, no skin discoloration, no axillary or supraclavicular lymphadenopathy Abdomen: no palpable masses or tenderness, no rebound or guarding Extremities: no edema or skin discoloration or tenderness  Pelvic:  Bartholin, Urethra, Skene Glands: Within normal limits             Vagina: No gross lesions or  discharge, atrophic changes  Cervix: absent  Uterus  absent  Adnexa  Without masses or tenderness  Anus and perineum  normal   Rectovaginal  normal sphincter tone without palpated masses or tenderness             Hemoccult cards provided     Assessment/Plan:  64 y.o. female for annual exam with evidence of vaginal atrophy we discussed different treatment options as well as the women's health initiative study. Patient  will be prescribed Estrace vaginal cream to apply twice a week. Pap smear not indicated according to the new guidelines. I have requested that the patient asked her PCP in the next few weeks when she sees him to send [place] a copy of her bone density studies a week and a grade our records. She was reminded on the importance of calcium vitamin D and regular exercise for osteoporosis prevention. We discussed importance of monthly self breast examination. Patient did receive the flu vaccine today.   Terrance Mass MD, 9:21 AM 09/18/2015

## 2015-11-25 ENCOUNTER — Encounter: Payer: Self-pay | Admitting: Internal Medicine

## 2015-11-25 ENCOUNTER — Ambulatory Visit (INDEPENDENT_AMBULATORY_CARE_PROVIDER_SITE_OTHER): Payer: 59 | Admitting: Internal Medicine

## 2015-11-25 ENCOUNTER — Ambulatory Visit (INDEPENDENT_AMBULATORY_CARE_PROVIDER_SITE_OTHER)
Admission: RE | Admit: 2015-11-25 | Discharge: 2015-11-25 | Disposition: A | Discharge status: Home / Self Care | Payer: 59 | Source: Ambulatory Visit | Attending: Internal Medicine | Admitting: Internal Medicine

## 2015-11-25 ENCOUNTER — Other Ambulatory Visit (INDEPENDENT_AMBULATORY_CARE_PROVIDER_SITE_OTHER): Payer: 59

## 2015-11-25 VITALS — BP 122/80 | HR 69 | Temp 98.3°F | Resp 16 | Ht 67.0 in | Wt 198.0 lb

## 2015-11-25 DIAGNOSIS — J209 Acute bronchitis, unspecified: Secondary | ICD-10-CM | POA: Diagnosis not present

## 2015-11-25 DIAGNOSIS — R059 Cough, unspecified: Secondary | ICD-10-CM | POA: Insufficient documentation

## 2015-11-25 DIAGNOSIS — R05 Cough: Secondary | ICD-10-CM

## 2015-11-25 DIAGNOSIS — E781 Pure hyperglyceridemia: Secondary | ICD-10-CM

## 2015-11-25 DIAGNOSIS — I10 Essential (primary) hypertension: Secondary | ICD-10-CM | POA: Diagnosis not present

## 2015-11-25 DIAGNOSIS — Z1159 Encounter for screening for other viral diseases: Secondary | ICD-10-CM

## 2015-11-25 DIAGNOSIS — E038 Other specified hypothyroidism: Secondary | ICD-10-CM

## 2015-11-25 DIAGNOSIS — M25562 Pain in left knee: Secondary | ICD-10-CM | POA: Insufficient documentation

## 2015-11-25 DIAGNOSIS — K5901 Slow transit constipation: Secondary | ICD-10-CM

## 2015-11-25 DIAGNOSIS — Z114 Encounter for screening for human immunodeficiency virus [HIV]: Secondary | ICD-10-CM

## 2015-11-25 DIAGNOSIS — F411 Generalized anxiety disorder: Secondary | ICD-10-CM

## 2015-11-25 LAB — BASIC METABOLIC PANEL
BUN: 11 mg/dL (ref 6–23)
CO2: 32 meq/L (ref 19–32)
Calcium: 9.6 mg/dL (ref 8.4–10.5)
Chloride: 99 mEq/L (ref 96–112)
Creatinine, Ser: 0.75 mg/dL (ref 0.40–1.20)
GFR: 99.89 mL/min (ref >60.00)
GLUCOSE: 131 mg/dL — AB (ref 70–99)
POTASSIUM: 3.5 meq/L (ref 3.5–5.1)
SODIUM: 140 meq/L (ref 135–145)

## 2015-11-25 LAB — LIPID PANEL
CHOL/HDL RATIO: 4
CHOLESTEROL: 152 mg/dL (ref 0–200)
HDL: 38.7 mg/dL — AB (ref >39.00)
LDL CALC: 78 mg/dL (ref 0–99)
NonHDL: 113.58
TRIGLYCERIDES: 178 mg/dL — AB (ref 0.0–149.0)
VLDL: 35.6 mg/dL (ref 0.0–40.0)

## 2015-11-25 LAB — HIV ANTIBODY (ROUTINE TESTING W REFLEX): HIV: NONREACTIVE

## 2015-11-25 LAB — HEPATITIS C ANTIBODY: HCV Ab: NEGATIVE

## 2015-11-25 LAB — TSH: TSH: 0.96 u[IU]/mL (ref 0.35–4.50)

## 2015-11-25 MED ORDER — AZITHROMYCIN 500 MG PO TABS: 500.0000 mg | ORAL_TABLET | Freq: Every day | ORAL | Status: DC

## 2015-11-25 MED ORDER — LINACLOTIDE 145 MCG PO CAPS: 145.0000 ug | ORAL_CAPSULE | Freq: Every day | ORAL | Status: DC

## 2015-11-25 MED ORDER — PROMETHAZINE-DM 6.25-15 MG/5ML PO SYRP: 5.0000 mL | ORAL_SOLUTION | Freq: Four times a day (QID) | ORAL | Status: DC | PRN

## 2015-11-25 NOTE — Progress Notes (Signed)
Pre visit review using our clinic review tool, if applicable. No additional management support is needed unless otherwise documented below in the visit note. 

## 2015-11-25 NOTE — Progress Notes (Signed)
Subjective:  Patient ID: [name], female    DOB: [birth year]  Age: 65 y.o. MRN: [id number]  CC: Hypothyroidism; Cough; Hypertension; and Osteoarthritis   HPI [name] presents for follow-up on thyroid disease and hypertension but she also complains of a two-week history of cough that is productive of thick green phlegm. She also complains of chronic worsening left knee pain. She describes the knee pain as a throbbing sensation.  Outpatient Prescriptions Prior to Visit  Medication Sig Dispense Refill  . clonazePAM (KLONOPIN) 0.5 MG tablet Take 1 tablet (0.5 mg total) by mouth 2 (two) times daily as needed for anxiety. 60 tablet 3  . estradiol (ESTRACE) 0.1 MG/GM vaginal cream Place 4.26 Applicatorfuls vaginally 2 (two) times a week. 42.5 g 8  . levothyroxine (SYNTHROID, LEVOTHROID) 125 MCG tablet TAKE 1 TABLET (125 MCG TOTAL) BY MOUTH DAILY. 30 tablet 5  . losartan-hydrochlorothiazide (HYZAAR) 100-12.5 MG per tablet Take 1 tablet by mouth daily. 90 tablet 3  . pravastatin (PRAVACHOL) 20 MG tablet TAKE 1 TABLET (20 MG TOTAL) BY MOUTH DAILY. 90 tablet 2  . polyethylene glycol (MIRALAX / GLYCOLAX) packet Take 17 g by mouth as needed.      . cyclobenzaprine (FLEXERIL) 10 MG tablet Take 10 mg by mouth 3 (three) times daily as needed for muscle spasms. Reported on 11/25/2015    . Linaclotide (LINZESS) 145 MCG CAPS capsule Take 1 capsule (145 mcg total) by mouth daily. (Patient not taking: Reported on 11/25/2015) 90 capsule 3  . potassium chloride SA (K-DUR,KLOR-CON) 20 MEQ tablet Take 1 tablet (20 mEq total) by mouth 3 (three) times daily. 90 tablet 2   No facility-administered medications prior to visit.    ROS Review of Systems  Constitutional: Negative.  Negative for fever, chills, diaphoresis, appetite change and fatigue.  HENT: Negative.  Negative for congestion, facial swelling, sore throat and trouble swallowing.   Eyes: Negative.   Respiratory: Positive for cough. Negative for  apnea, choking, chest tightness, shortness of breath, wheezing and stridor.   Cardiovascular: Negative.  Negative for chest pain, palpitations and leg swelling.  Gastrointestinal: Positive for constipation. Negative for nausea, vomiting, abdominal pain, diarrhea and blood in stool.  Endocrine: Negative.   Genitourinary: Negative.  Negative for dysuria, hematuria and difficulty urinating.  Musculoskeletal: Positive for arthralgias. Negative for myalgias, back pain, joint swelling, gait problem and neck pain.  Skin: Negative.  Negative for color change and rash.  Allergic/Immunologic: Negative.   Neurological: Negative.  Negative for dizziness, syncope, weakness, light-headedness and headaches.  Hematological: Negative.  Negative for adenopathy. Does not bruise/bleed easily.  Psychiatric/Behavioral: Negative.     Objective:  BP 122/80 mmHg  Pulse 69  Temp(Src) 98.3 F (36.8 C) (Oral)  Resp 16  Ht 5' 7"  (1.702 m)  Wt 198 lb (89.812 kg)  BMI 31.00 kg/m2  SpO2 96%  BP Readings from Last 3 Encounters:  11/25/15 122/80  09/18/15 118/80  05/22/15 120/78    Wt Readings from Last 3 Encounters:  11/25/15 198 lb (89.812 kg)  09/18/15 196 lb (88.905 kg)  05/22/15 198 lb 8 oz (90.039 kg)    Physical Exam  Constitutional: She is oriented to person, place, and time. She appears well-developed and well-nourished.  Non-toxic appearance. She does not have a sickly appearance. She does not appear ill. No distress.  HENT:  Head: Normocephalic and atraumatic.  Mouth/Throat: Oropharynx is clear and moist. No oropharyngeal exudate.  Eyes: Conjunctivae are normal. Right eye exhibits no discharge.  Left eye exhibits no discharge. No scleral icterus.  Neck: Normal range of motion. Neck supple. No JVD present. No tracheal deviation present. No thyromegaly present.  Cardiovascular: Normal rate, regular rhythm, S1 normal, S2 normal and intact distal pulses.  Exam reveals no gallop, no S3 and no S4.     Murmur heard.  Systolic murmur is present with a grade of 1/6   No diastolic murmur is present  Pulmonary/Chest: Effort normal and breath sounds normal. No stridor. No respiratory distress. She has no wheezes. She has no rales. She exhibits no tenderness.  Abdominal: Soft. Normal appearance and bowel sounds are normal. She exhibits no distension and no mass. There is no hepatosplenomegaly. There is no tenderness. There is no rebound, no guarding and no CVA tenderness.  Musculoskeletal: Normal range of motion. She exhibits no edema or tenderness.       Left knee: She exhibits deformity (DJD). She exhibits normal range of motion, no swelling, no effusion, no erythema and normal alignment. No lateral joint line tenderness noted.  Lymphadenopathy:    She has no cervical adenopathy.  Neurological: She is oriented to person, place, and time.  Skin: Skin is warm and dry. No rash noted. She is not diaphoretic. No erythema. No pallor.    Lab Results  Component Value Date   WBC 8.5 12/31/2014   HGB 13.1 12/31/2014   HCT 39.6 12/31/2014   PLT 306.0 12/31/2014   GLUCOSE 131* 11/25/2015   CHOL 152 11/25/2015   TRIG 178.0* 11/25/2015   HDL 38.70* 11/25/2015   LDLDIRECT 84.0 12/31/2014   LDLCALC 78 11/25/2015   ALT 9 12/31/2014   AST 14 12/31/2014   NA 140 11/25/2015   K 3.5 11/25/2015   CL 99 11/25/2015   CREATININE 0.75 11/25/2015   BUN 11 11/25/2015   CO2 32 11/25/2015   TSH 0.96 11/25/2015   INR 1.0 07/23/2009   HGBA1C 6.1 12/31/2014    Mm Screening Breast Tomo Bilateral  08/15/2015  CLINICAL DATA:  Screening. EXAM: DIGITAL SCREENING BILATERAL MAMMOGRAM WITH 3D TOMO WITH CAD COMPARISON:  Previous exam(s). ACR Breast Density Category b: There are scattered areas of fibroglandular density. FINDINGS: There are no findings suspicious for malignancy. Images were processed with CAD. IMPRESSION: No mammographic evidence of malignancy. A result letter of this screening mammogram will be mailed  directly to the patient. RECOMMENDATION: Screening mammogram in one year. (Code:SM-B-01Y) BI-RADS CATEGORY  1: Negative. Electronically Signed   By: Abelardo Diesel M.D.   On: 08/15/2015 13:39    Assessment & Plan:   Jhaniya was seen today for hypothyroidism, cough, hypertension and osteoarthritis.  Diagnoses and all orders for this visit:  Essential hypertension, benign- her BP is well controlled, lytes and renal function are stable -     Basic metabolic panel; Future  Other specified hypothyroidism- her TSh is in the normal range, she will stay on the current synthroid dose -     TSH; Future  Cough- her CXR is normal -     DG Chest 2 View; Future -     promethazine-dextromethorphan (PROMETHAZINE-DM) 6.25-15 MG/5ML syrup; Take 5 mLs by mouth 4 (four) times daily as needed for cough.  Acute bronchitis, unspecified organism -     azithromycin (ZITHROMAX) 500 MG tablet; Take 1 tablet (500 mg total) by mouth daily. -     promethazine-dextromethorphan (PROMETHAZINE-DM) 6.25-15 MG/5ML syrup; Take 5 mLs by mouth 4 (four) times daily as needed for cough.  Pure hyperglyceridemia- improvement noted -  Lipid panel; Future  Generalized anxiety disorder  Need for hepatitis C screening test -     Hepatitis C antibody; Future  Encounter for screening for HIV -     HIV antibody; Future  Constipation, slow transit -     Linaclotide (LINZESS) 145 MCG CAPS capsule; Take 1 capsule (145 mcg total) by mouth daily.  Left knee pain -     Ambulatory referral to Sports Medicine   I have discontinued Ms. Meggison's polyethylene glycol, potassium chloride SA, and cyclobenzaprine. I am also having her start on azithromycin and promethazine-dextromethorphan. Additionally, I am having her maintain her pravastatin, levothyroxine, clonazePAM, losartan-hydrochlorothiazide, estradiol, esomeprazole, and Linaclotide.  Meds ordered this encounter  Medications  . esomeprazole (NEXIUM) 20 MG capsule    Sig: Take  20 mg by mouth daily at 12 noon.  Marland Kitchen Linaclotide (LINZESS) 145 MCG CAPS capsule    Sig: Take 1 capsule (145 mcg total) by mouth daily.    Dispense:  90 capsule    Refill:  3  . azithromycin (ZITHROMAX) 500 MG tablet    Sig: Take 1 tablet (500 mg total) by mouth daily.    Dispense:  3 tablet    Refill:  0  . promethazine-dextromethorphan (PROMETHAZINE-DM) 6.25-15 MG/5ML syrup    Sig: Take 5 mLs by mouth 4 (four) times daily as needed for cough.    Dispense:  118 mL    Refill:  0     Follow-up: Return in about 3 weeks (around 12/16/2015).  [name], MD

## 2015-11-26 ENCOUNTER — Encounter: Payer: Self-pay | Admitting: Internal Medicine

## 2015-12-02 ENCOUNTER — Ambulatory Visit (INDEPENDENT_AMBULATORY_CARE_PROVIDER_SITE_OTHER): Payer: 59 | Admitting: Family Medicine

## 2015-12-02 ENCOUNTER — Encounter: Payer: Self-pay | Admitting: Family Medicine

## 2015-12-02 ENCOUNTER — Other Ambulatory Visit (INDEPENDENT_AMBULATORY_CARE_PROVIDER_SITE_OTHER): Payer: 59

## 2015-12-02 VITALS — BP 128/82 | HR 77 | Ht 67.0 in | Wt 197.0 lb

## 2015-12-02 DIAGNOSIS — M25562 Pain in left knee: Secondary | ICD-10-CM

## 2015-12-02 DIAGNOSIS — M1712 Unilateral primary osteoarthritis, left knee: Secondary | ICD-10-CM | POA: Insufficient documentation

## 2015-12-02 DIAGNOSIS — M129 Arthropathy, unspecified: Secondary | ICD-10-CM

## 2015-12-02 DIAGNOSIS — M7052 Other bursitis of knee, left knee: Secondary | ICD-10-CM | POA: Diagnosis not present

## 2015-12-02 MED ORDER — DICLOFENAC SODIUM 2 % TD SOLN: TRANSDERMAL | Status: DC

## 2015-12-02 NOTE — Progress Notes (Signed)
Pre visit review using our clinic review tool, if applicable. No additional management support is needed unless otherwise documented below in the visit note. 

## 2015-12-02 NOTE — Assessment & Plan Note (Signed)
Patient does have left knee arthritis. I think it is mild-to-moderate in nature. We discussed icing regimen, home exercises, patient did respond well to an injection today. Patient has any worsening symptoms we'll consider further x-ray. Prescription for topical anti-inflammatory sent in today. Patient will home exercises but may need referral to formal physical therapy. Patient come back in 3-4 weeks to make sure she is responding appropriately.

## 2015-12-02 NOTE — Assessment & Plan Note (Signed)
Also contributing at this time. We discussed icing regimen and home exercises. Patient will try compression. Patient will come back and see me again in 3-4 weeks. Possible injection may be necessary. Topical anti-inflammatories prescribed.

## 2015-12-02 NOTE — Patient Instructions (Signed)
Good to see you Ice 20 minutes 2 times daily. Usually after activity and before bed. Exercises 3 times a week.  Good shoes with rigid bottom.  Jalene Mullet, Merrell or New balance greater then 700 pennsaid pinkie amount topically 2 times daily as needed.  Consider compression sleeve with activity ([facility]) could get better at [facility] or omega sports Turmeric 582m 2 times daily Vitamin D 2000 IU daily Tart cherry extract at night See em again in 3-4 weeks to make sure you are doing well.  Happy New Year!

## 2015-12-02 NOTE — Progress Notes (Signed)
[name] [address], [place] [address] Phone: [phone] Subjective:    I'm seeing this patient by the request  of:  [name], MD   CC: Left knee pain   [id number] [name] is a 65 y.o. female coming in with complaint of left knee pain. Patient states that the left knee pain is more of a dull, throbbing aching sensation. Patient states she does not rib or any injury. Has had this intermittently over the course last 5 years. Patient states she remembers having an aspiration of her knee multiple years ago. Denies any swelling at this time. Sometimes feels that the knee is unstable. States that all the pain is on the medial aspect of the knee. Can wake her up at night. Sometimes feels like it gets in a locked position. Denies any radiation down the leg or any swelling of the lower coming. Denies any weakness. Still able to do daily activities. Sometimes twisting motions can give her some difficulty or going up and down stairs. Rates the severity of pain a 6 out of 10. Has not tried any other home modalities at this time.     Past Medical History  Diagnosis Date  . Hypertension   . Hypothyroid   . Cholelithiasis   . Fibromyalgia   . Chronic back pain   . Hyperlipidemia   . Constipation   . Anemia   . Personal history of colonic polyps 05/06/2010    ADENOMATOUS POLYP  . Internal hemorrhoids without mention of complication   . Esophageal reflux   . Arthritis   . IBS (irritable bowel syndrome)    Past Surgical History  Procedure Laterality Date  . Abdominal hysterectomy    . Lumbar disc surgery    . Tonsillectomy    . Cholecystectomy    . Cervical disc surgery      PLATE IN NECK & BACK  . Colonoscopy  09/27/2011    NORMAL    Social History   Social History  . Marital Status: Married    Spouse Name: N/A  . Number of Children: 2  . Years of Education: N/A   Occupational History  . housewife    Social History Main  Topics  . Smoking status: Never Smoker   . Smokeless tobacco: Never Used  . Alcohol Use: No  . Drug Use: No  . Sexual Activity: Not Currently   Other Topics Concern  . None   Social History Narrative   Daily Caffeine Use   Allergies  Allergen Reactions  . Livalo [Pitavastatin]     myalgias  . Aspirin   . Gadolinium      Code: HIVES, Desc: patient unsure of which kind of dye she had a reaction to until reaction to Magnevist - may be allergic to x-ray contrast, Onset Date: [id number]   . Hydrocodone-Acetaminophen   . Iohexol   . Lipitor [Atorvastatin]     REACTION: Myalgias  . Crestor [Rosuvastatin Calcium]     rash   Family History  Problem Relation Age of Onset  . Diabetes Mother     aunts and uncles  . Kidney disease Mother     stage 3  . Lung disease Mother     colapsed lung/in hospice  . COPD Mother   . Hypertension Mother   . Dementia Mother   . Breast cancer Maternal Aunt   . Heart failure Father     Mother     Past  medical history, social, surgical and family history all reviewed in electronic medical record.  No pertanent information unless stated regarding to the chief complaint.   Review of Systems: No headache, visual changes, nausea, vomiting, diarrhea, constipation, dizziness, abdominal pain, skin rash, fevers, chills, night sweats, weight loss, swollen lymph nodes, body aches, joint swelling, muscle aches, chest pain, shortness of breath, mood changes.   Objective Blood pressure 128/82, pulse 77, height 5' 7"  (1.702 m), weight 197 lb (89.359 kg), SpO2 97 %.  General: No apparent distress alert and oriented x3 mood and affect normal, dressed appropriately.  HEENT: Pupils equal, extraocular movements intact  Respiratory: Patient's speak in full sentences and does not appear short of breath  Cardiovascular: No lower extremity edema, non tender, no erythema  Skin: Warm dry intact with no signs of infection or rash on extremities or on axial skeleton.    Abdomen: Soft nontender  Neuro: Cranial nerves II through XII are intact, neurovascularly intact in all extremities with 2+ DTRs and 2+ pulses.  Lymph: No lymphadenopathy of posterior or anterior cervical chain or axillae bilaterally.  Gait normal with good balance and coordination.  MSK:  Non tender with full range of motion and good stability and symmetric strength and tone of shoulders, elbows, wrist, hip, and ankles bilaterally.  Knee: Left knee Normal to inspection with no erythema or effusion or obvious bony abnormalities. Mild tenderness to palpation over the medial joint line and just distal to the medial joint line ROM full in flexion and extension and lower leg rotation. Ligaments with solid consistent endpoints including ACL, PCL, LCL, MCL. Mild positive Mcmurray's, Apley's, and Thessalonian tests.  painful patellar compression. Patellar glide with mild crepitus. Patellar and quadriceps tendons unremarkable. Hamstring and quadriceps strength is normal.  Contralateral knee unremarkable  MSK US performed of: Left knee This study was ordered, performed, and interpreted by Charlann Boxer D.O.  Knee: All structures visualized. Mild narrowing of the medial joint line with some mild spurring. Patient also has some mild chronic changes of the meniscus and hypoechoic changes over the pes bursa Patellar Tendon unremarkable on long and transverse views without effusion. No abnormality of prepatellar bursa. LCL and MCL unremarkable on long and transverse views. No abnormality of origin of medial or lateral head of the gastrocnemius.  IMPRESSION:  Mild to moderate arthritic changes as well as pes bursitis  Procedure: Real-time Ultrasound Guided Injection of left knee Device: GE Logiq E  Ultrasound guided injection is preferred based studies that show increased duration, increased effect, greater accuracy, decreased procedural pain, increased response rate, and decreased cost with  ultrasound guided versus blind injection.  Verbal informed consent obtained.  Time-out conducted.  Noted no overlying erythema, induration, or other signs of local infection.  Skin prepped in a sterile fashion.  Local anesthesia: Topical Ethyl chloride.  With sterile technique and under real time ultrasound guidance: With a 22-gauge 2 inch needle patient was injected with 4 cc of 0.5% Marcaine and 1 cc of Kenalog 40 mg/dL. This was from a superior lateral approach.  Completed without difficulty  Pain immediately resolved suggesting accurate placement of the medication.  Advised to call if fevers/chills, erythema, induration, drainage, or persistent bleeding.  Images permanently stored and available for review in the ultrasound unit.  Impression: Technically successful ultrasound guided injection.  Procedure note 32992; 15 minutes spent for Therapeutic exercises as stated in above notes.  This included exercises focusing on stretching, strengthening, with significant focus on eccentric aspects.  Flexion and  extension exercises as well as significant focus on the vastus medialis oblique strengthening as well as hip abductor strengthening. Patient also was doing some hamstring stretching. Proper technique shown and discussed handout in great detail with ATC.  All questions were discussed and answered.     Impression and Recommendations:     This case required medical decision making of moderate complexity.

## 2015-12-02 NOTE — Assessment & Plan Note (Signed)
Likely exacerbating condition as well.

## 2015-12-08 ENCOUNTER — Other Ambulatory Visit: Payer: Self-pay | Admitting: Internal Medicine

## 2015-12-16 ENCOUNTER — Encounter: Payer: Self-pay | Admitting: Internal Medicine

## 2015-12-16 ENCOUNTER — Ambulatory Visit (INDEPENDENT_AMBULATORY_CARE_PROVIDER_SITE_OTHER): Payer: 59 | Admitting: Internal Medicine

## 2015-12-16 VITALS — BP 120/80 | HR 78 | Temp 98.2°F | Ht 67.0 in | Wt 195.0 lb

## 2015-12-16 DIAGNOSIS — E038 Other specified hypothyroidism: Secondary | ICD-10-CM

## 2015-12-16 DIAGNOSIS — I10 Essential (primary) hypertension: Secondary | ICD-10-CM | POA: Diagnosis not present

## 2015-12-16 DIAGNOSIS — K5901 Slow transit constipation: Secondary | ICD-10-CM | POA: Diagnosis not present

## 2015-12-16 NOTE — Progress Notes (Signed)
Subjective:  Patient ID: [name], female    DOB: November 14, 1951  Age: 65 y.o. MRN: [id number]  CC: Hypothyroidism   HPI [name] presents for a follow-up, she feels well and offers no new complaints. The constipation has responded well to Linzess.  Outpatient Prescriptions Prior to Visit  Medication Sig Dispense Refill  . clonazePAM (KLONOPIN) 0.5 MG tablet Take 1 tablet (0.5 mg total) by mouth 2 (two) times daily as needed for anxiety. 60 tablet 3  . Diclofenac Sodium 2 % SOLN Use a fingertip amount, or more as needed, to affected area twice daily 1 Bottle 2  . esomeprazole (NEXIUM) 20 MG capsule Take 20 mg by mouth daily at 12 noon.    Marland Kitchen levothyroxine (SYNTHROID, LEVOTHROID) 125 MCG tablet TAKE 1 TABLET (125 MCG TOTAL) BY MOUTH DAILY. 30 tablet 5  . Linaclotide (LINZESS) 145 MCG CAPS capsule Take 1 capsule (145 mcg total) by mouth daily. 90 capsule 3  . losartan-hydrochlorothiazide (HYZAAR) 100-12.5 MG per tablet Take 1 tablet by mouth daily. 90 tablet 3  . pravastatin (PRAVACHOL) 20 MG tablet TAKE 1 TABLET (20 MG TOTAL) BY MOUTH DAILY. 90 tablet 2  . azithromycin (ZITHROMAX) 500 MG tablet Take 1 tablet (500 mg total) by mouth daily. 3 tablet 0  . estradiol (ESTRACE) 0.1 MG/GM vaginal cream Place 4.46 Applicatorfuls vaginally 2 (two) times a week. 42.5 g 8  . promethazine-dextromethorphan (PROMETHAZINE-DM) 6.25-15 MG/5ML syrup Take 5 mLs by mouth 4 (four) times daily as needed for cough. 118 mL 0   No facility-administered medications prior to visit.    ROS Review of Systems  Constitutional: Negative.  Negative for fatigue and unexpected weight change.  HENT: Negative.   Eyes: Negative.   Respiratory: Negative.  Negative for cough, choking, chest tightness, shortness of breath and stridor.   Cardiovascular: Negative.  Negative for chest pain, palpitations and leg swelling.  Gastrointestinal: Positive for constipation. Negative for nausea, vomiting, abdominal pain and  diarrhea.  Endocrine: Negative.   Genitourinary: Negative.   Musculoskeletal: Negative.  Negative for myalgias, back pain, joint swelling, arthralgias and neck pain.  Skin: Negative.  Negative for rash.  Allergic/Immunologic: Negative.   Neurological: Negative.   Hematological: Negative.  Negative for adenopathy. Does not bruise/bleed easily.  Psychiatric/Behavioral: Negative.  Negative for sleep disturbance. The patient is not nervous/anxious.     Objective:  BP 120/80 mmHg  Pulse 78  Temp(Src) 98.2 F (36.8 C) (Oral)  Ht 5' 7"  (1.702 m)  Wt 195 lb (88.451 kg)  BMI 30.53 kg/m2  SpO2 98%  BP Readings from Last 3 Encounters:  12/16/15 120/80  12/02/15 128/82  11/25/15 122/80    Wt Readings from Last 3 Encounters:  12/16/15 195 lb (88.451 kg)  12/02/15 197 lb (89.359 kg)  11/25/15 198 lb (89.812 kg)    Physical Exam  Constitutional: She is oriented to person, place, and time. No distress.  HENT:  Mouth/Throat: Oropharynx is clear and moist. No oropharyngeal exudate.  Eyes: Conjunctivae are normal. Right eye exhibits no discharge. Left eye exhibits no discharge. No scleral icterus.  Neck: Normal range of motion. Neck supple. No JVD present. No tracheal deviation present. No thyromegaly present.  Cardiovascular: Normal rate, regular rhythm, normal heart sounds and intact distal pulses.  Exam reveals no gallop and no friction rub.   No murmur heard. Pulmonary/Chest: Effort normal and breath sounds normal. No stridor. No respiratory distress. She has no wheezes. She has no rales. She exhibits no tenderness.  Abdominal: Soft. Bowel sounds are normal. She exhibits no distension and no mass. There is no tenderness. There is no rebound and no guarding.  Musculoskeletal: Normal range of motion. She exhibits no edema or tenderness.  Lymphadenopathy:    She has no cervical adenopathy.  Neurological: She is oriented to person, place, and time.  Skin: Skin is warm and dry. No rash  noted. She is not diaphoretic. No erythema. No pallor.  Psychiatric: She has a normal mood and affect. Her behavior is normal. Judgment and thought content normal.  Vitals reviewed.   Lab Results  Component Value Date   WBC 8.5 12/31/2014   HGB 13.1 12/31/2014   HCT 39.6 12/31/2014   PLT 306.0 12/31/2014   GLUCOSE 131* 11/25/2015   CHOL 152 11/25/2015   TRIG 178.0* 11/25/2015   HDL 38.70* 11/25/2015   LDLDIRECT 84.0 12/31/2014   LDLCALC 78 11/25/2015   ALT 9 12/31/2014   AST 14 12/31/2014   NA 140 11/25/2015   K 3.5 11/25/2015   CL 99 11/25/2015   CREATININE 0.75 11/25/2015   BUN 11 11/25/2015   CO2 32 11/25/2015   TSH 0.96 11/25/2015   INR 1.0 07/23/2009   HGBA1C 6.1 12/31/2014    Dg Chest 2 View  11/25/2015  CLINICAL DATA:  65 year old female with cough, congestion and headache for the past 7 days EXAM: CHEST  2 VIEW COMPARISON:  Prior chest x-ray 08/06/2014 FINDINGS: The lungs are clear and negative for focal airspace consolidation, pulmonary edema or suspicious pulmonary nodule. No pleural effusion or pneumothorax. Cardiac and mediastinal contours are within normal limits. No acute fracture or lytic or blastic osseous lesions. The visualized upper abdominal bowel gas pattern is unremarkable. IMPRESSION: No active cardiopulmonary disease. Electronically Signed   By: Jacqulynn Cadet M.D.   On: 11/25/2015 09:03    Assessment & Plan:   [name] was seen today for hypothyroidism.  Diagnoses and all orders for this visit:  Essential hypertension, benign- her blood pressure is well-controlled  Other specified hypothyroidism- her TSH is in the normal range, we'll continue the current dose of Synthroid.  Constipation, slow transit- this has responded well to Linzess, will continue.  I have discontinued Ms. [name]'s estradiol, azithromycin, and promethazine-dextromethorphan. I am also having her maintain her clonazePAM, losartan-hydrochlorothiazide, esomeprazole, Linaclotide,  Diclofenac Sodium, pravastatin, and levothyroxine.  No orders of the defined types were placed in this encounter.     Follow-up: Return in about 6 months (around 06/14/2016).  Emily Calico, MD

## 2015-12-16 NOTE — Progress Notes (Signed)
Pre visit review using our clinic review tool, if applicable. No additional management support is needed unless otherwise documented below in the visit note. 

## 2015-12-16 NOTE — Patient Instructions (Signed)

## 2015-12-24 ENCOUNTER — Ambulatory Visit: Payer: 59 | Admitting: Family Medicine

## 2015-12-30 ENCOUNTER — Encounter: Payer: Self-pay | Admitting: Family Medicine

## 2015-12-30 ENCOUNTER — Ambulatory Visit (INDEPENDENT_AMBULATORY_CARE_PROVIDER_SITE_OTHER): Payer: 59 | Admitting: Family Medicine

## 2015-12-30 VITALS — BP 112/82 | HR 75 | Ht 67.0 in | Wt 197.0 lb

## 2015-12-30 DIAGNOSIS — M1712 Unilateral primary osteoarthritis, left knee: Secondary | ICD-10-CM | POA: Diagnosis not present

## 2015-12-30 NOTE — Progress Notes (Signed)
[name] [address], [place] [address] Phone: [phone] Subjective:    I'm seeing this patient by the request  of:  [name], MD   CC: Left knee pain   [id number] [name] is a 65 y.o. female coming in with complaint of left knee pain. Patient was found to have mild to moderate osteoarthritic changes as well as a pes bursitis. Patient was given exercises as well as an injection. Patient has been doing topical anti-inflammatories and staying active. Patient states that she is 100% better. No Pain at this time. Doing well.     Past Medical History  Diagnosis Date  . Hypertension   . Hypothyroid   . Cholelithiasis   . Fibromyalgia   . Chronic back pain   . Hyperlipidemia   . Constipation   . Anemia   . Personal history of colonic polyps 05/06/2010    ADENOMATOUS POLYP  . Internal hemorrhoids without mention of complication   . Esophageal reflux   . Arthritis   . IBS (irritable bowel syndrome)    Past Surgical History  Procedure Laterality Date  . Abdominal hysterectomy    . Lumbar disc surgery    . Tonsillectomy    . Cholecystectomy    . Cervical disc surgery      PLATE IN NECK & BACK  . Colonoscopy  09/27/2011    NORMAL    Social History   Social History  . Marital Status: Married    Spouse Name: N/A  . Number of Children: 2  . Years of Education: N/A   Occupational History  . housewife    Social History Main Topics  . Smoking status: Never Smoker   . Smokeless tobacco: Never Used  . Alcohol Use: No  . Drug Use: No  . Sexual Activity: Not Currently   Other Topics Concern  . None   Social History Narrative   Daily Caffeine Use   Allergies  Allergen Reactions  . Livalo [Pitavastatin]     myalgias  . Aspirin   . Gadolinium      Code: HIVES, Desc: patient unsure of which kind of dye she had a reaction to until reaction to Magnevist - may be allergic to x-ray contrast, Onset Date: [id number]     . Hydrocodone-Acetaminophen   . Iohexol   . Lipitor [Atorvastatin]     REACTION: Myalgias  . Crestor [Rosuvastatin Calcium]     rash   Family History  Problem Relation Age of Onset  . Diabetes Mother     aunts and uncles  . Kidney disease Mother     stage 3  . Lung disease Mother     colapsed lung/in hospice  . COPD Mother   . Hypertension Mother   . Dementia Mother   . Breast cancer Maternal Aunt   . Heart failure Father     Mother     Past medical history, social, surgical and family history all reviewed in electronic medical record.  No pertanent information unless stated regarding to the chief complaint.   Review of Systems: No headache, visual changes, nausea, vomiting, diarrhea, constipation, dizziness, abdominal pain, skin rash, fevers, chills, night sweats, weight loss, swollen lymph nodes, body aches, joint swelling, muscle aches, chest pain, shortness of breath, mood changes.   Objective Blood pressure 112/82, pulse 75, height 5' 7"  (1.702 m), weight 197 lb (89.359 kg), SpO2 95 %.  General: No apparent distress alert and oriented x3 mood  and affect normal, dressed appropriately.  HEENT: Pupils equal, extraocular movements intact  Respiratory: Patient's speak in full sentences and does not appear short of breath  Cardiovascular: No lower extremity edema, non tender, no erythema  Skin: Warm dry intact with no signs of infection or rash on extremities or on axial skeleton.  Abdomen: Soft nontender  Neuro: Cranial nerves II through XII are intact, neurovascularly intact in all extremities with 2+ DTRs and 2+ pulses.  Lymph: No lymphadenopathy of posterior or anterior cervical chain or axillae bilaterally.  Gait normal with good balance and coordination.  MSK:  Non tender with full range of motion and good stability and symmetric strength and tone of shoulders, elbows, wrist, hip, and ankles bilaterally.  Knee: Left knee Normal to inspection with no erythema or effusion  or obvious bony abnormalities. Mild tenderness to palpation over the medial joint line and just distal to the medial joint line ROM full in flexion and extension and lower leg rotation. Ligaments with solid consistent endpoints including ACL, PCL, LCL, MCL. Negative Thessaly's  Minimal painful patellar compression. Patellar glide with mild crepitus. Patellar and quadriceps tendons unremarkable. Hamstring and quadriceps strength is normal.  Contralateral knee unremarkable       Impression and Recommendations:     This case required medical decision making of moderate complexity.

## 2015-12-30 NOTE — Assessment & Plan Note (Signed)
Patient is doing remarkably well at this time. Discussed that we can do a repeat injection in 3 months if necessary. Patient though I think will do well with conservative therapy and encourage her to continue to stay active. Patient will follow-up as needed.

## 2015-12-30 NOTE — Patient Instructions (Signed)
Good to see you  You make me happy  Keep it up you are dong amazing.  Ice is your friend  Keep up the exercises 2-3 times a week See me again when you need me.

## 2015-12-30 NOTE — Progress Notes (Signed)
Pre visit review using our clinic review tool, if applicable. No additional management support is needed unless otherwise documented below in the visit note. 

## 2016-01-07 ENCOUNTER — Emergency Department (HOSPITAL_COMMUNITY): Payer: 59

## 2016-01-07 ENCOUNTER — Encounter (HOSPITAL_COMMUNITY): Payer: Self-pay | Admitting: Emergency Medicine

## 2016-01-07 ENCOUNTER — Telehealth: Payer: Self-pay | Admitting: Internal Medicine

## 2016-01-07 ENCOUNTER — Emergency Department (HOSPITAL_COMMUNITY)
Admission: EM | Admit: 2016-01-07 | Discharge: 2016-01-08 | Disposition: A | Discharge status: Home / Self Care | Payer: 59 | Attending: Emergency Medicine | Admitting: Emergency Medicine

## 2016-01-07 DIAGNOSIS — E785 Hyperlipidemia, unspecified: Secondary | ICD-10-CM | POA: Insufficient documentation

## 2016-01-07 DIAGNOSIS — R000 Tachycardia, unspecified: Secondary | ICD-10-CM | POA: Diagnosis not present

## 2016-01-07 DIAGNOSIS — M1990 Unspecified osteoarthritis, unspecified site: Secondary | ICD-10-CM | POA: Diagnosis not present

## 2016-01-07 DIAGNOSIS — E039 Hypothyroidism, unspecified: Secondary | ICD-10-CM | POA: Insufficient documentation

## 2016-01-07 DIAGNOSIS — R05 Cough: Secondary | ICD-10-CM | POA: Diagnosis present

## 2016-01-07 DIAGNOSIS — K219 Gastro-esophageal reflux disease without esophagitis: Secondary | ICD-10-CM | POA: Diagnosis not present

## 2016-01-07 DIAGNOSIS — Z86010 Personal history of colonic polyps: Secondary | ICD-10-CM | POA: Diagnosis not present

## 2016-01-07 DIAGNOSIS — G8929 Other chronic pain: Secondary | ICD-10-CM | POA: Diagnosis not present

## 2016-01-07 DIAGNOSIS — Z79899 Other long term (current) drug therapy: Secondary | ICD-10-CM | POA: Diagnosis not present

## 2016-01-07 DIAGNOSIS — I10 Essential (primary) hypertension: Secondary | ICD-10-CM | POA: Diagnosis not present

## 2016-01-07 DIAGNOSIS — J111 Influenza due to unidentified influenza virus with other respiratory manifestations: Secondary | ICD-10-CM | POA: Diagnosis not present

## 2016-01-07 DIAGNOSIS — M797 Fibromyalgia: Secondary | ICD-10-CM | POA: Insufficient documentation

## 2016-01-07 DIAGNOSIS — K5900 Constipation, unspecified: Secondary | ICD-10-CM | POA: Insufficient documentation

## 2016-01-07 DIAGNOSIS — Z862 Personal history of diseases of the blood and blood-forming organs and certain disorders involving the immune mechanism: Secondary | ICD-10-CM | POA: Diagnosis not present

## 2016-01-07 DIAGNOSIS — K589 Irritable bowel syndrome without diarrhea: Secondary | ICD-10-CM | POA: Insufficient documentation

## 2016-01-07 LAB — BASIC METABOLIC PANEL
Anion gap: 11 (ref 5–15)
BUN: 14 mg/dL (ref 6–20)
CALCIUM: 9.1 mg/dL (ref 8.9–10.3)
CO2: 27 mmol/L (ref 22–32)
CREATININE: 0.91 mg/dL (ref 0.44–1.00)
Chloride: 100 mmol/L — ABNORMAL LOW (ref 101–111)
Glucose, Bld: 140 mg/dL — ABNORMAL HIGH (ref 65–99)
Potassium: 3.5 mmol/L (ref 3.5–5.1)
SODIUM: 138 mmol/L (ref 135–145)

## 2016-01-07 LAB — RAPID STREP SCREEN (MED CTR MEBANE ONLY): STREPTOCOCCUS, GROUP A SCREEN (DIRECT): NEGATIVE

## 2016-01-07 LAB — CBC WITH DIFFERENTIAL/PLATELET
BASOS PCT: 0 %
Basophils Absolute: 0 10*3/uL (ref 0.0–0.1)
EOS ABS: 0.1 10*3/uL (ref 0.0–0.7)
Eosinophils Relative: 1 %
HCT: 35 % — ABNORMAL LOW (ref 36.0–46.0)
HEMOGLOBIN: 11.9 g/dL — AB (ref 12.0–15.0)
Lymphocytes Relative: 10 %
Lymphs Abs: 1.1 10*3/uL (ref 0.7–4.0)
MCH: 26.4 pg (ref 26.0–34.0)
MCHC: 34 g/dL (ref 30.0–36.0)
MCV: 77.6 fL — ABNORMAL LOW (ref 78.0–100.0)
MONO ABS: 0.6 10*3/uL (ref 0.1–1.0)
MONOS PCT: 5 %
NEUTROS PCT: 84 %
Neutro Abs: 9 10*3/uL — ABNORMAL HIGH (ref 1.7–7.7)
Platelets: 287 10*3/uL (ref 150–400)
RBC: 4.51 MIL/uL (ref 3.87–5.11)
RDW: 13.7 % (ref 11.5–15.5)
WBC: 10.8 10*3/uL — ABNORMAL HIGH (ref 4.0–10.5)

## 2016-01-07 LAB — I-STAT CG4 LACTIC ACID, ED: LACTIC ACID, VENOUS: 2.91 mmol/L — AB (ref 0.5–2.0)

## 2016-01-07 MED ORDER — SODIUM CHLORIDE 0.9 % IV BOLUS (SEPSIS)
1000.0000 mL | Freq: Once | INTRAVENOUS | Status: AC
  Administered 2016-01-07: 1000 mL via INTRAVENOUS

## 2016-01-07 MED ORDER — OSELTAMIVIR PHOSPHATE 75 MG PO CAPS: 75.0000 mg | ORAL_CAPSULE | Freq: Two times a day (BID) | ORAL | Status: DC

## 2016-01-07 MED ORDER — ACETAMINOPHEN 325 MG PO TABS
650.0000 mg | ORAL_TABLET | Freq: Once | ORAL | Status: AC
  Administered 2016-01-07: 650 mg via ORAL
  Filled 2016-01-07: qty 2

## 2016-01-07 MED ORDER — BENZONATATE 100 MG PO CAPS: 100.0000 mg | ORAL_CAPSULE | Freq: Three times a day (TID) | ORAL | Status: DC

## 2016-01-07 MED ORDER — OSELTAMIVIR PHOSPHATE 75 MG PO CAPS
75.0000 mg | ORAL_CAPSULE | Freq: Two times a day (BID) | ORAL | Status: DC
  Administered 2016-01-08: 75 mg via ORAL
  Filled 2016-01-07: qty 1

## 2016-01-07 NOTE — ED Provider Notes (Signed)
CSN: [id number]     Arrival date & time 01/07/16  2049 History   First MD Initiated Contact with Patient 01/07/16 2125     Chief Complaint  Patient presents with  . Cough  . Headache  . Sore Throat    HPI Pt developed a mild scratchy throat yesterday but otherwise felt fine.  This am she woke up with a bad sore throat and began coughing a lot.  She has felt chilled most of the day.  She had an episode of nausea and vomiting.  No diarrhea.  No dysuria.  She got her flu shot this year. Past Medical History  Diagnosis Date  . Hypertension   . Hypothyroid   . Cholelithiasis   . Fibromyalgia   . Chronic back pain   . Hyperlipidemia   . Constipation   . Anemia   . Personal history of colonic polyps 05/06/2010    ADENOMATOUS POLYP  . Internal hemorrhoids without mention of complication   . Esophageal reflux   . Arthritis   . IBS (irritable bowel syndrome)    Past Surgical History  Procedure Laterality Date  . Abdominal hysterectomy    . Lumbar disc surgery    . Tonsillectomy    . Cholecystectomy    . Cervical disc surgery      PLATE IN NECK & BACK  . Colonoscopy  09/27/2011    NORMAL    Family History  Problem Relation Age of Onset  . Diabetes Mother     aunts and uncles  . Kidney disease Mother     stage 3  . Lung disease Mother     colapsed lung/in hospice  . COPD Mother   . Hypertension Mother   . Dementia Mother   . Breast cancer Maternal Aunt   . Heart failure Father     Mother   Social History  Substance Use Topics  . Smoking status: Never Smoker   . Smokeless tobacco: Never Used  . Alcohol Use: No   OB History    Gravida Para Term Preterm AB TAB SAB Ectopic Multiple Living   3 2   1  1   2      Review of Systems  All other systems reviewed and are negative.     Allergies  Livalo; Aspirin; Gadolinium; Hydrocodone-acetaminophen; Iohexol; Lipitor; and Crestor  Home Medications   Prior to Admission medications   Medication Sig Start Date End Date  Taking? Authorizing Provider  esomeprazole (NEXIUM) 20 MG capsule Take 20 mg by mouth daily at 12 noon.   Yes Historical Provider, MD  GuaiFENesin (COUGH SYRUP PO) Take 10 mLs by mouth daily as needed (cough).   Yes Historical Provider, MD  levothyroxine (SYNTHROID, LEVOTHROID) 125 MCG tablet TAKE 1 TABLET (125 MCG TOTAL) BY MOUTH DAILY. 12/08/15  Yes [name], MD  losartan-hydrochlorothiazide (HYZAAR) 100-12.5 MG per tablet Take 1 tablet by mouth daily. 05/22/15  Yes [name], MD  naproxen sodium (ANAPROX) 220 MG tablet Take 440 mg by mouth 2 (two) times daily as needed (pain).   Yes Historical Provider, MD  pravastatin (PRAVACHOL) 20 MG tablet TAKE 1 TABLET (20 MG TOTAL) BY MOUTH DAILY. 12/08/15  Yes [name], MD  ranitidine (ZANTAC) 150 MG tablet Take 150 mg by mouth 2 (two) times daily as needed for heartburn.   Yes Historical Provider, MD  benzonatate (TESSALON) 100 MG capsule Take 1 capsule (100 mg total) by mouth every 8 (eight) hours. 01/07/16  Dorie Rank, MD  clonazePAM (KLONOPIN) 0.5 MG tablet Take 1 tablet (0.5 mg total) by mouth 2 (two) times daily as needed for anxiety. 05/22/15 05/21/16  [name], MD  Diclofenac Sodium 2 % SOLN Use a fingertip amount, or more as needed, to affected area twice daily 12/02/15   Lyndal Pulley, DO  Linaclotide [facility]) 145 MCG CAPS capsule Take 1 capsule (145 mcg total) by mouth daily. 11/25/15   [name], MD  oseltamivir (TAMIFLU) 75 MG capsule Take 1 capsule (75 mg total) by mouth 2 (two) times daily. 01/07/16   Dorie Rank, MD   BP 141/82 mmHg  Pulse 114  Temp(Src) 99.9 F (37.7 C) (Oral)  Resp 18  SpO2 96% Physical Exam  Constitutional: She appears well-developed and well-nourished. No distress.  HENT:  Head: Normocephalic and atraumatic.  Right Ear: External ear normal.  Left Ear: External ear normal.  Eyes: Conjunctivae are normal. Right eye exhibits no discharge. Left eye exhibits no discharge. No scleral icterus.  Neck:  Neck supple. No tracheal deviation present.  Cardiovascular: Regular rhythm and intact distal pulses.  Tachycardia present.   Pulmonary/Chest: Effort normal and breath sounds normal. No stridor. No respiratory distress. She has no wheezes. She has no rales.  Abdominal: Soft. Bowel sounds are normal. She exhibits no distension. There is no tenderness. There is no rebound and no guarding.  Musculoskeletal: She exhibits no edema or tenderness.  Neurological: She is alert. She has normal strength. No cranial nerve deficit (no facial droop, extraocular movements intact, no slurred speech) or sensory deficit. She exhibits normal muscle tone. She displays no seizure activity. Coordination normal.  Skin: Skin is warm and dry. No rash noted.  Psychiatric: She has a normal mood and affect.  Nursing note and vitals reviewed.   ED Course  Procedures (including critical care time) Labs Review Labs Reviewed  CBC WITH DIFFERENTIAL/PLATELET - Abnormal; Notable for the following:    WBC 10.8 (*)    Hemoglobin 11.9 (*)    HCT 35.0 (*)    MCV 77.6 (*)    Neutro Abs 9.0 (*)    All other components within normal limits  BASIC METABOLIC PANEL - Abnormal; Notable for the following:    Chloride 100 (*)    Glucose, Bld 140 (*)    All other components within normal limits  I-STAT CG4 LACTIC ACID, ED - Abnormal; Notable for the following:    Lactic Acid, Venous 2.91 (*)    All other components within normal limits  RAPID STREP SCREEN (NOT AT [facility])  CULTURE, GROUP A STREP [facility])    Imaging Review Dg Chest 2 View  01/07/2016  CLINICAL DATA:  Acute onset of cough, congestion and fever. Initial encounter. EXAM: CHEST  2 VIEW COMPARISON:  Chest radiograph performed 11/25/2015 FINDINGS: The lungs are well-aerated. Minimal left basilar atelectasis is noted. Pulmonary vascularity is at the upper limits of normal. There is no evidence of effusion or pneumothorax. The heart is normal in size; the mediastinal contour is  within normal limits. No acute osseous abnormalities are seen. Clips are noted within the right upper quadrant, reflecting prior cholecystectomy. IMPRESSION: Minimal left basilar atelectasis noted.  Lungs otherwise clear. Electronically Signed   By: Garald Balding M.D.   On: 01/07/2016 21:58   I have personally reviewed and evaluated these images and lab results as part of my medical decision-making.  Medications  oseltamivir (TAMIFLU) capsule 75 mg (not administered)  acetaminophen (TYLENOL) tablet 650 mg (650  mg Oral Given 01/07/16 2153)  sodium chloride 0.9 % bolus 1,000 mL (1,000 mLs Intravenous New Bag/Given 01/07/16 2329)     MDM   Final diagnoses:  Influenza    Patient strep test is negative. Chest x-ray is negative for pneumonia. Laboratory tests are notable for mild increase in her white blood cell count and an elevated lactic acid level.  I suspect her symptoms are related to influenza.  Her blood pressure has remained stable. Her tachycardia has decreased as her has decreased. She was given a dose of IV fluids. Plan on discharge home with prescription for Tamiflu and Tessalon. Follow-up with a primary doctor.    Dorie Rank, MD 01/07/16 [phone]

## 2016-01-07 NOTE — ED Notes (Signed)
Pt states she has a cough, headache, and sore throat since 5am this morning  Pt states she feels worse as the day progresses  Pt states she has had chills and been on the couch under a blanket all day

## 2016-01-07 NOTE — Telephone Encounter (Signed)
Pt called in and said that she wants to know what she can take that is over the counter that will help her sinus.  She wanted a nurse to call her back ?

## 2016-01-07 NOTE — Discharge Instructions (Signed)

## 2016-01-08 NOTE — Telephone Encounter (Signed)
Called pt she stated that it wasn't her sinuses. She ended up going to ER [facility]) due to high fever, chills, vomiting. They dx her with the flu...Johny Chess

## 2016-01-08 NOTE — Telephone Encounter (Signed)
What are her  symptoms?  

## 2016-01-10 LAB — CULTURE, GROUP A STREP (THRC)

## 2016-05-16 ENCOUNTER — Other Ambulatory Visit: Payer: Self-pay | Admitting: Internal Medicine

## 2016-06-14 ENCOUNTER — Other Ambulatory Visit (INDEPENDENT_AMBULATORY_CARE_PROVIDER_SITE_OTHER): Payer: Medicare Other

## 2016-06-14 ENCOUNTER — Encounter: Payer: Self-pay | Admitting: Internal Medicine

## 2016-06-14 ENCOUNTER — Ambulatory Visit (INDEPENDENT_AMBULATORY_CARE_PROVIDER_SITE_OTHER): Payer: Medicare Other | Admitting: Internal Medicine

## 2016-06-14 VITALS — BP 120/78 | HR 76 | Temp 98.4°F | Resp 16 | Ht 67.0 in | Wt 191.5 lb

## 2016-06-14 DIAGNOSIS — E876 Hypokalemia: Secondary | ICD-10-CM

## 2016-06-14 DIAGNOSIS — E66811 Obesity, class 1: Secondary | ICD-10-CM

## 2016-06-14 DIAGNOSIS — E038 Other specified hypothyroidism: Secondary | ICD-10-CM | POA: Diagnosis not present

## 2016-06-14 DIAGNOSIS — K5901 Slow transit constipation: Secondary | ICD-10-CM

## 2016-06-14 DIAGNOSIS — I10 Essential (primary) hypertension: Secondary | ICD-10-CM

## 2016-06-14 DIAGNOSIS — D539 Nutritional anemia, unspecified: Secondary | ICD-10-CM

## 2016-06-14 DIAGNOSIS — E669 Obesity, unspecified: Secondary | ICD-10-CM

## 2016-06-14 DIAGNOSIS — R7303 Prediabetes: Secondary | ICD-10-CM | POA: Diagnosis not present

## 2016-06-14 DIAGNOSIS — Z23 Encounter for immunization: Secondary | ICD-10-CM

## 2016-06-14 LAB — FOLATE: FOLATE: 9.5 ng/mL (ref >5.9)

## 2016-06-14 LAB — CBC WITH DIFFERENTIAL/PLATELET
BASOS PCT: 0.5 % (ref 0.0–3.0)
Basophils Absolute: 0 10*3/uL (ref 0.0–0.1)
EOS ABS: 0.2 10*3/uL (ref 0.0–0.7)
EOS PCT: 1.7 % (ref 0.0–5.0)
HCT: 38.9 % (ref 36.0–46.0)
Hemoglobin: 13.1 g/dL (ref 12.0–15.0)
LYMPHS ABS: 2.7 10*3/uL (ref 0.7–4.0)
Lymphocytes Relative: 29.9 % (ref 12.0–46.0)
MCHC: 33.7 g/dL (ref 30.0–36.0)
MCV: 79.4 fl (ref 78.0–100.0)
MONO ABS: 0.6 10*3/uL (ref 0.1–1.0)
Monocytes Relative: 6.1 % (ref 3.0–12.0)
NEUTROS ABS: 5.7 10*3/uL (ref 1.4–7.7)
Neutrophils Relative %: 61.8 % (ref 43.0–77.0)
PLATELETS: 325 10*3/uL (ref 150.0–400.0)
RBC: 4.9 Mil/uL (ref 3.87–5.11)
RDW: 14 % (ref 11.5–15.5)
WBC: 9.2 10*3/uL (ref 4.0–10.5)

## 2016-06-14 LAB — COMPREHENSIVE METABOLIC PANEL
ALT: 12 U/L (ref 0–35)
AST: 15 U/L (ref 0–37)
Albumin: 4 g/dL (ref 3.5–5.2)
Alkaline Phosphatase: 80 U/L (ref 39–117)
BILIRUBIN TOTAL: 1.2 mg/dL (ref 0.2–1.2)
BUN: 8 mg/dL (ref 6–23)
CHLORIDE: 98 meq/L (ref 96–112)
CO2: 33 meq/L — AB (ref 19–32)
Calcium: 9.6 mg/dL (ref 8.4–10.5)
Creatinine, Ser: 0.79 mg/dL (ref 0.40–1.20)
GFR: 93.92 mL/min (ref >60.00)
GLUCOSE: 113 mg/dL — AB (ref 70–99)
POTASSIUM: 3.2 meq/L — AB (ref 3.5–5.1)
Sodium: 139 mEq/L (ref 135–145)
Total Protein: 7.8 g/dL (ref 6.0–8.3)

## 2016-06-14 LAB — HEMOGLOBIN A1C: Hgb A1c MFr Bld: 6 % (ref 4.6–6.5)

## 2016-06-14 LAB — FERRITIN: FERRITIN: 72.6 ng/mL (ref 10.0–291.0)

## 2016-06-14 LAB — IBC PANEL
IRON: 77 ug/dL (ref 42–145)
Saturation Ratios: 22.6 % (ref 20.0–50.0)
TRANSFERRIN: 243 mg/dL (ref 212.0–360.0)

## 2016-06-14 LAB — VITAMIN B12: Vitamin B-12: 371 pg/mL (ref 211–911)

## 2016-06-14 LAB — MAGNESIUM: Magnesium: 2.1 mg/dL (ref 1.5–2.5)

## 2016-06-14 LAB — TSH: TSH: 0.11 u[IU]/mL — AB (ref 0.35–4.50)

## 2016-06-14 MED ORDER — LEVOTHYROXINE SODIUM 112 MCG PO TABS: 112.0000 ug | ORAL_TABLET | Freq: Every day | ORAL | Status: DC

## 2016-06-14 MED ORDER — POTASSIUM CHLORIDE CRYS ER 20 MEQ PO TBCR: 20.0000 meq | EXTENDED_RELEASE_TABLET | Freq: Two times a day (BID) | ORAL | Status: DC

## 2016-06-14 NOTE — Assessment & Plan Note (Signed)
She is working on her lifestyle modifications to lose weight.

## 2016-06-14 NOTE — Patient Instructions (Signed)
Hypertension Hypertension, commonly called high blood pressure, is when the force of blood pumping through your arteries is too strong. Your arteries are the blood vessels that carry blood from your heart throughout your body. A blood pressure reading consists of a higher number over a lower number, such as 110/72. The higher number (systolic) is the pressure inside your arteries when your heart pumps. The lower number (diastolic) is the pressure inside your arteries when your heart relaxes. Ideally you want your blood pressure below 120/80. Hypertension forces your heart to work harder to pump blood. Your arteries may become narrow or stiff. Having untreated or uncontrolled hypertension can cause heart attack, stroke, kidney disease, and other problems. RISK FACTORS Some risk factors for high blood pressure are controllable. Others are not.  Risk factors you cannot control include:   Race. You may be at higher risk if you are African American.  Age. Risk increases with age.  Gender. Men are at higher risk than women before age 45 years. After age 65, women are at higher risk than men. Risk factors you can control include:  Not getting enough exercise or physical activity.  Being overweight.  Getting too much fat, sugar, calories, or salt in your diet.  Drinking too much alcohol. SIGNS AND SYMPTOMS Hypertension does not usually cause signs or symptoms. Extremely high blood pressure (hypertensive crisis) may cause headache, anxiety, shortness of breath, and nosebleed. DIAGNOSIS To check if you have hypertension, your health care provider will measure your blood pressure while you are seated, with your arm held at the level of your heart. It should be measured at least twice using the same arm. Certain conditions can cause a difference in blood pressure between your right and left arms. A blood pressure reading that is higher than normal on one occasion does not mean that you need treatment. If  it is not clear whether you have high blood pressure, you may be asked to return on a different day to have your blood pressure checked again. Or, you may be asked to monitor your blood pressure at home for 1 or more weeks. TREATMENT Treating high blood pressure includes making lifestyle changes and possibly taking medicine. Living a healthy lifestyle can help lower high blood pressure. You may need to change some of your habits. Lifestyle changes may include:  Following the DASH diet. This diet is high in fruits, vegetables, and whole grains. It is low in salt, red meat, and added sugars.  Keep your sodium intake below 2,300 mg per day.  Getting at least 30-45 minutes of aerobic exercise at least 4 times per week.  Losing weight if necessary.  Not smoking.  Limiting alcoholic beverages.  Learning ways to reduce stress. Your health care provider may prescribe medicine if lifestyle changes are not enough to get your blood pressure under control, and if one of the following is true:  You are 18-59 years of age and your systolic blood pressure is above 140.  You are 60 years of age or older, and your systolic blood pressure is above 150.  Your diastolic blood pressure is above 90.  You have diabetes, and your systolic blood pressure is over 140 or your diastolic blood pressure is over 90.  You have kidney disease and your blood pressure is above 140/90.  You have heart disease and your blood pressure is above 140/90. Your personal target blood pressure may vary depending on your medical conditions, your age, and other factors. HOME CARE INSTRUCTIONS    Have your blood pressure rechecked as directed by your health care provider.   Take medicines only as directed by your health care provider. Follow the directions carefully. Blood pressure medicines must be taken as prescribed. The medicine does not work as well when you skip doses. Skipping doses also puts you at risk for  problems.  Do not smoke.   Monitor your blood pressure at home as directed by your health care provider. SEEK MEDICAL CARE IF:   You think you are having a reaction to medicines taken.  You have recurrent headaches or feel dizzy.  You have swelling in your ankles.  You have trouble with your vision. SEEK IMMEDIATE MEDICAL CARE IF:  You develop a severe headache or confusion.  You have unusual weakness, numbness, or feel faint.  You have severe chest or abdominal pain.  You vomit repeatedly.  You have trouble breathing. MAKE SURE YOU:   Understand these instructions.  Will watch your condition.  Will get help right away if you are not doing well or get worse.   This information is not intended to replace advice given to you by your health care provider. Make sure you discuss any questions you have with your health care provider.   Document Released: 11/15/2005 Document Revised: 04/01/2015 Document Reviewed: 09/07/2013 Elsevier Interactive Patient Education 2016 [employer]  

## 2016-06-14 NOTE — Progress Notes (Signed)
Subjective:  Patient ID: [name], female    DOB: 1951/07/14  Age: 65 y.o. MRN: [id number]  CC: Anemia; Hypertension; and Hypothyroidism   HPI [name] presents for follow-up on the above medical problems. She has lost some weight since I last saw her and she has not been trying to lose weight. She otherwise feels well and tells me her blood pressure has been well controlled on the combination of an ARB and hydrochlorothiazide. She's had no recent episodes of headache, blurred vision, chest pain, shortness of breath, edema, fatigue, or palpitations. Her constipation is well-controlled with Linzess.  Outpatient Prescriptions Prior to Visit  Medication Sig Dispense Refill  . Diclofenac Sodium 2 % SOLN Use a fingertip amount, or more as needed, to affected area twice daily 1 Bottle 2  . esomeprazole (NEXIUM) 20 MG capsule Take 20 mg by mouth daily at 12 noon.    . Linaclotide (LINZESS) 145 MCG CAPS capsule Take 1 capsule (145 mcg total) by mouth daily. 90 capsule 3  . losartan-hydrochlorothiazide (HYZAAR) 100-12.5 MG per tablet Take 1 tablet by mouth daily. 90 tablet 3  . pravastatin (PRAVACHOL) 20 MG tablet TAKE 1 TABLET (20 MG TOTAL) BY MOUTH DAILY. 90 tablet 2  . levothyroxine (SYNTHROID, LEVOTHROID) 125 MCG tablet TAKE 1 TABLET (125 MCG TOTAL) BY MOUTH DAILY. 30 tablet 5  . clonazePAM (KLONOPIN) 0.5 MG tablet Take 1 tablet (0.5 mg total) by mouth 2 (two) times daily as needed for anxiety. 60 tablet 3  . benzonatate (TESSALON) 100 MG capsule Take 1 capsule (100 mg total) by mouth every 8 (eight) hours. 21 capsule 0  . GuaiFENesin (COUGH SYRUP PO) Take 10 mLs by mouth daily as needed (cough).    . losartan-hydrochlorothiazide (HYZAAR) 100-12.5 MG tablet TAKE 1 TABLET BY MOUTH DAILY. 90 tablet 2  . naproxen sodium (ANAPROX) 220 MG tablet Take 440 mg by mouth 2 (two) times daily as needed (pain).    Marland Kitchen oseltamivir (TAMIFLU) 75 MG capsule Take 1 capsule (75 mg total) by mouth 2 (two) times  daily. 9 capsule 0  . ranitidine (ZANTAC) 150 MG tablet Take 150 mg by mouth 2 (two) times daily as needed for heartburn.     No facility-administered medications prior to visit.    ROS Review of Systems  Constitutional: Positive for unexpected weight change. Negative for diaphoresis, activity change, appetite change and fatigue.  HENT: Negative.   Eyes: Negative.   Respiratory: Negative.  Negative for cough, choking, chest tightness, shortness of breath and stridor.   Cardiovascular: Negative.  Negative for chest pain, palpitations and leg swelling.  Gastrointestinal: Negative.  Negative for nausea, vomiting, abdominal pain, diarrhea and constipation.  Endocrine: Negative.   Genitourinary: Negative.  Negative for difficulty urinating.  Musculoskeletal: Negative.  Negative for myalgias, back pain, arthralgias and neck pain.  Skin: Negative.  Negative for color change and rash.  Allergic/Immunologic: Negative.   Neurological: Negative.  Negative for dizziness, tremors, weakness, light-headedness, numbness and headaches.  Hematological: Negative.  Negative for adenopathy. Does not bruise/bleed easily.  Psychiatric/Behavioral: Negative.     Objective:  BP 120/78 mmHg  Pulse 76  Temp(Src) 98.4 F (36.9 C) (Oral)  Resp 16  Ht 5' 7"  (1.702 m)  Wt 191 lb 8 oz (86.864 kg)  BMI 29.99 kg/m2  SpO2 95%  BP Readings from Last 3 Encounters:  06/14/16 120/78  01/07/16 141/82  12/30/15 112/82    Wt Readings from Last 3 Encounters:  06/14/16 191 lb 8  oz (86.864 kg)  12/30/15 197 lb (89.359 kg)  12/16/15 195 lb (88.451 kg)    Physical Exam  Constitutional: She is oriented to person, place, and time. No distress.  HENT:  Mouth/Throat: Oropharynx is clear and moist. No oropharyngeal exudate.  Eyes: Conjunctivae are normal. Right eye exhibits no discharge. Left eye exhibits no discharge. No scleral icterus.  Neck: Normal range of motion. Neck supple. No JVD present. No tracheal  deviation present. No thyromegaly present.  Cardiovascular: Normal rate, regular rhythm, normal heart sounds and intact distal pulses.  Exam reveals no gallop and no friction rub.   No murmur heard. Pulmonary/Chest: Effort normal and breath sounds normal. No stridor. No respiratory distress. She has no wheezes. She has no rales. She exhibits no tenderness.  Abdominal: Soft. Bowel sounds are normal. She exhibits no distension and no mass. There is no tenderness. There is no rebound and no guarding.  Musculoskeletal: Normal range of motion. She exhibits no edema or tenderness.  Lymphadenopathy:    She has no cervical adenopathy.  Neurological: She is oriented to person, place, and time.  Skin: Skin is warm and dry. No rash noted. She is not diaphoretic. No erythema. No pallor.  Vitals reviewed.   Lab Results  Component Value Date   WBC 9.2 06/14/2016   HGB 13.1 06/14/2016   HCT 38.9 06/14/2016   PLT 325.0 06/14/2016   GLUCOSE 113* 06/14/2016   CHOL 152 11/25/2015   TRIG 178.0* 11/25/2015   HDL 38.70* 11/25/2015   LDLDIRECT 84.0 12/31/2014   LDLCALC 78 11/25/2015   ALT 12 06/14/2016   AST 15 06/14/2016   NA 139 06/14/2016   K 3.2* 06/14/2016   CL 98 06/14/2016   CREATININE 0.79 06/14/2016   BUN 8 06/14/2016   CO2 33* 06/14/2016   TSH 0.11* 06/14/2016   INR 1.0 07/23/2009   HGBA1C 6.0 06/14/2016    Dg Chest 2 View  01/07/2016  CLINICAL DATA:  Acute onset of cough, congestion and fever. Initial encounter. EXAM: CHEST  2 VIEW COMPARISON:  Chest radiograph performed 11/25/2015 FINDINGS: The lungs are well-aerated. Minimal left basilar atelectasis is noted. Pulmonary vascularity is at the upper limits of normal. There is no evidence of effusion or pneumothorax. The heart is normal in size; the mediastinal contour is within normal limits. No acute osseous abnormalities are seen. Clips are noted within the right upper quadrant, reflecting prior cholecystectomy. IMPRESSION: Minimal left  basilar atelectasis noted.  Lungs otherwise clear. Electronically Signed   By: Garald Balding M.D.   On: 01/07/2016 21:58    Assessment & Plan:   [name] was seen today for anemia, hypertension and hypothyroidism.  Diagnoses and all orders for this visit:  Other specified hypothyroidism- her TSH is slightly suppressed so I have decreased her levothyroxine dose from 125 g a day to 112 g a day. -     TSH; Future -     levothyroxine (SYNTHROID, LEVOTHROID) 112 MCG tablet; Take 1 tablet (112 mcg total) by mouth daily.  Essential hypertension, benign- her blood pressure is adequately well controlled, she has developed hypokalemia due to the thiazide diuretics I've asked her to start taking an oral potassium supplement. -     Comprehensive metabolic panel; Future -     Magnesium; Future -     potassium chloride SA (K-DUR,KLOR-CON) 20 MEQ tablet; Take 1 tablet (20 mEq total) by mouth 2 (two) times daily.  Constipation, slow transit- improvement noted -     Comprehensive metabolic  panel; Future -     Magnesium; Future  HYPOKALEMIA, MILD- I will start treating this with an oral potassium supplement -     Comprehensive metabolic panel; Future -     Magnesium; Future -     potassium chloride SA (K-DUR,KLOR-CON) 20 MEQ tablet; Take 1 tablet (20 mEq total) by mouth 2 (two) times daily.  Prediabetes- her A1c has drifted down from 6.1% to 6.2%, she was praised for her weight loss. -     Comprehensive metabolic panel; Future -     Hemoglobin A1c; Future  Deficiency anemia- her anemia has resolved and her vitamin levels are all normal, I will continue to follow this. -     Folate; Future -     Ferritin; Future -     IBC panel; Future -     Vitamin B12; Future -     CBC with Differential/Platelet; Future  Need for prophylactic vaccination against Streptococcus pneumoniae (pneumococcus) -     Pneumococcal conjugate vaccine 13-valent   I have discontinued Ms. Staunton's levothyroxine, naproxen  sodium, ranitidine, GuaiFENesin (COUGH SYRUP PO), oseltamivir, and benzonatate. I am also having her start on levothyroxine and potassium chloride SA. Additionally, I am having her maintain her clonazePAM, losartan-hydrochlorothiazide, esomeprazole, linaclotide, Diclofenac Sodium, and pravastatin.  Meds ordered this encounter  Medications  . levothyroxine (SYNTHROID, LEVOTHROID) 112 MCG tablet    Sig: Take 1 tablet (112 mcg total) by mouth daily.    Dispense:  90 tablet    Refill:  1  . potassium chloride SA (K-DUR,KLOR-CON) 20 MEQ tablet    Sig: Take 1 tablet (20 mEq total) by mouth 2 (two) times daily.    Dispense:  60 tablet    Refill:  11     Follow-up: Return in about 6 months (around 12/15/2016).  [name], MD

## 2016-06-14 NOTE — Progress Notes (Signed)
Pre visit review using our clinic review tool, if applicable. No additional management support is needed unless otherwise documented below in the visit note. 

## 2016-06-16 ENCOUNTER — Other Ambulatory Visit: Payer: Self-pay | Admitting: Internal Medicine

## 2016-07-12 ENCOUNTER — Other Ambulatory Visit: Payer: Self-pay | Admitting: Internal Medicine

## 2016-07-12 DIAGNOSIS — Z1231 Encounter for screening mammogram for malignant neoplasm of breast: Secondary | ICD-10-CM

## 2016-08-16 ENCOUNTER — Ambulatory Visit
Admission: RE | Admit: 2016-08-16 | Discharge: 2016-08-16 | Disposition: A | Discharge status: Home / Self Care | Payer: Medicare Other | Source: Ambulatory Visit | Attending: Internal Medicine | Admitting: Internal Medicine

## 2016-08-16 DIAGNOSIS — Z1231 Encounter for screening mammogram for malignant neoplasm of breast: Secondary | ICD-10-CM | POA: Diagnosis not present

## 2016-08-23 LAB — HM MAMMOGRAPHY

## 2016-09-09 DIAGNOSIS — M5137 Other intervertebral disc degeneration, lumbosacral region: Secondary | ICD-10-CM | POA: Diagnosis not present

## 2016-09-09 DIAGNOSIS — Z6829 Body mass index (BMI) 29.0-29.9, adult: Secondary | ICD-10-CM | POA: Diagnosis not present

## 2016-09-09 DIAGNOSIS — M542 Cervicalgia: Secondary | ICD-10-CM | POA: Diagnosis not present

## 2016-09-20 ENCOUNTER — Encounter: Payer: 59 | Admitting: Gynecology

## 2016-09-22 ENCOUNTER — Encounter: Payer: Self-pay | Admitting: Gynecology

## 2016-09-22 ENCOUNTER — Encounter (INDEPENDENT_AMBULATORY_CARE_PROVIDER_SITE_OTHER): Payer: Self-pay | Admitting: Gynecology

## 2016-09-23 NOTE — Progress Notes (Signed)
Error

## 2016-10-11 ENCOUNTER — Encounter: Payer: Medicare Other | Admitting: Gynecology

## 2016-10-12 ENCOUNTER — Encounter: Payer: Self-pay | Admitting: Internal Medicine

## 2016-10-19 ENCOUNTER — Ambulatory Visit (INDEPENDENT_AMBULATORY_CARE_PROVIDER_SITE_OTHER): Payer: Medicare Other | Admitting: Internal Medicine

## 2016-10-19 ENCOUNTER — Encounter: Payer: Self-pay | Admitting: Internal Medicine

## 2016-10-19 ENCOUNTER — Other Ambulatory Visit (INDEPENDENT_AMBULATORY_CARE_PROVIDER_SITE_OTHER): Payer: Medicare Other

## 2016-10-19 ENCOUNTER — Ambulatory Visit (INDEPENDENT_AMBULATORY_CARE_PROVIDER_SITE_OTHER)
Admission: RE | Admit: 2016-10-19 | Discharge: 2016-10-19 | Disposition: A | Discharge status: Home / Self Care | Payer: Medicare Other | Source: Ambulatory Visit | Attending: Internal Medicine | Admitting: Internal Medicine

## 2016-10-19 ENCOUNTER — Telehealth: Payer: Self-pay

## 2016-10-19 VITALS — BP 160/88 | HR 70 | Temp 98.1°F | Resp 16 | Ht 67.0 in | Wt 188.5 lb

## 2016-10-19 DIAGNOSIS — E781 Pure hyperglyceridemia: Secondary | ICD-10-CM | POA: Diagnosis not present

## 2016-10-19 DIAGNOSIS — E039 Hypothyroidism, unspecified: Secondary | ICD-10-CM | POA: Diagnosis not present

## 2016-10-19 DIAGNOSIS — E876 Hypokalemia: Secondary | ICD-10-CM

## 2016-10-19 DIAGNOSIS — R05 Cough: Secondary | ICD-10-CM | POA: Diagnosis not present

## 2016-10-19 DIAGNOSIS — E785 Hyperlipidemia, unspecified: Secondary | ICD-10-CM

## 2016-10-19 DIAGNOSIS — I10 Essential (primary) hypertension: Secondary | ICD-10-CM | POA: Diagnosis not present

## 2016-10-19 DIAGNOSIS — R059 Cough, unspecified: Secondary | ICD-10-CM

## 2016-10-19 DIAGNOSIS — J988 Other specified respiratory disorders: Secondary | ICD-10-CM

## 2016-10-19 DIAGNOSIS — E038 Other specified hypothyroidism: Secondary | ICD-10-CM

## 2016-10-19 DIAGNOSIS — Z23 Encounter for immunization: Secondary | ICD-10-CM | POA: Diagnosis not present

## 2016-10-19 LAB — LDL CHOLESTEROL, DIRECT: Direct LDL: 88 mg/dL

## 2016-10-19 LAB — TSH: TSH: 1.3 u[IU]/mL (ref 0.35–4.50)

## 2016-10-19 LAB — COMPREHENSIVE METABOLIC PANEL
ALK PHOS: 81 U/L (ref 39–117)
ALT: 9 U/L (ref 0–35)
AST: 14 U/L (ref 0–37)
Albumin: 3.9 g/dL (ref 3.5–5.2)
BUN: 9 mg/dL (ref 6–23)
CO2: 34 meq/L — AB (ref 19–32)
Calcium: 9.6 mg/dL (ref 8.4–10.5)
Chloride: 100 mEq/L (ref 96–112)
Creatinine, Ser: 0.75 mg/dL (ref 0.40–1.20)
GFR: 99.61 mL/min (ref >60.00)
Glucose, Bld: 107 mg/dL — ABNORMAL HIGH (ref 70–99)
Potassium: 3.5 mEq/L (ref 3.5–5.1)
Sodium: 141 mEq/L (ref 135–145)
Total Bilirubin: 0.9 mg/dL (ref 0.2–1.2)
Total Protein: 7.4 g/dL (ref 6.0–8.3)

## 2016-10-19 LAB — LIPID PANEL
CHOL/HDL RATIO: 4
Cholesterol: 161 mg/dL (ref 0–200)
HDL: 40.6 mg/dL (ref >39.00)
NONHDL: 120.85
Triglycerides: 208 mg/dL — ABNORMAL HIGH (ref 0.0–149.0)
VLDL: 41.6 mg/dL — ABNORMAL HIGH (ref 0.0–40.0)

## 2016-10-19 LAB — MAGNESIUM: MAGNESIUM: 2.1 mg/dL (ref 1.5–2.5)

## 2016-10-19 MED ORDER — NEBIVOLOL HCL 5 MG PO TABS: 5.0000 mg | ORAL_TABLET | Freq: Every day | ORAL | 0 refills | Status: DC

## 2016-10-19 MED ORDER — CEFDINIR 300 MG PO CAPS: 300.0000 mg | ORAL_CAPSULE | Freq: Two times a day (BID) | ORAL | 1 refills | Status: DC

## 2016-10-19 NOTE — Patient Instructions (Signed)
Hypertension Hypertension, commonly called high blood pressure, is when the force of blood pumping through your arteries is too strong. Your arteries are the blood vessels that carry blood from your heart throughout your body. A blood pressure reading consists of a higher number over a lower number, such as 110/72. The higher number (systolic) is the pressure inside your arteries when your heart pumps. The lower number (diastolic) is the pressure inside your arteries when your heart relaxes. Ideally you want your blood pressure below 120/80. Hypertension forces your heart to work harder to pump blood. Your arteries may become narrow or stiff. Having untreated or uncontrolled hypertension can cause heart attack, stroke, kidney disease, and other problems. What increases the risk? Some risk factors for high blood pressure are controllable. Others are not. Risk factors you cannot control include:  Race. You may be at higher risk if you are African American.  Age. Risk increases with age.  Gender. Men are at higher risk than women before age 45 years. After age 65, women are at higher risk than men. Risk factors you can control include:  Not getting enough exercise or physical activity.  Being overweight.  Getting too much fat, sugar, calories, or salt in your diet.  Drinking too much alcohol. What are the signs or symptoms? Hypertension does not usually cause signs or symptoms. Extremely high blood pressure (hypertensive crisis) may cause headache, anxiety, shortness of breath, and nosebleed. How is this diagnosed? To check if you have hypertension, your health care provider will measure your blood pressure while you are seated, with your arm held at the level of your heart. It should be measured at least twice using the same arm. Certain conditions can cause a difference in blood pressure between your right and left arms. A blood pressure reading that is higher than normal on one occasion does  not mean that you need treatment. If it is not clear whether you have high blood pressure, you may be asked to return on a different day to have your blood pressure checked again. Or, you may be asked to monitor your blood pressure at home for 1 or more weeks. How is this treated? Treating high blood pressure includes making lifestyle changes and possibly taking medicine. Living a healthy lifestyle can help lower high blood pressure. You may need to change some of your habits. Lifestyle changes may include:  Following the DASH diet. This diet is high in fruits, vegetables, and whole grains. It is low in salt, red meat, and added sugars.  Keep your sodium intake below 2,300 mg per day.  Getting at least 30-45 minutes of aerobic exercise at least 4 times per week.  Losing weight if necessary.  Not smoking.  Limiting alcoholic beverages.  Learning ways to reduce stress. Your health care provider may prescribe medicine if lifestyle changes are not enough to get your blood pressure under control, and if one of the following is true:  You are 18-59 years of age and your systolic blood pressure is above 140.  You are 60 years of age or older, and your systolic blood pressure is above 150.  Your diastolic blood pressure is above 90.  You have diabetes, and your systolic blood pressure is over 140 or your diastolic blood pressure is over 90.  You have kidney disease and your blood pressure is above 140/90.  You have heart disease and your blood pressure is above 140/90. Your personal target blood pressure may vary depending on your medical   conditions, your age, and other factors. Follow these instructions at home:  Have your blood pressure rechecked as directed by your health care provider.  Take medicines only as directed by your health care provider. Follow the directions carefully. Blood pressure medicines must be taken as prescribed. The medicine does not work as well when you skip  doses. Skipping doses also puts you at risk for problems.  Do not smoke.  Monitor your blood pressure at home as directed by your health care provider. Contact a health care provider if:  You think you are having a reaction to medicines taken.  You have recurrent headaches or feel dizzy.  You have swelling in your ankles.  You have trouble with your vision. Get help right away if:  You develop a severe headache or confusion.  You have unusual weakness, numbness, or feel faint.  You have severe chest or abdominal pain.  You vomit repeatedly.  You have trouble breathing. This information is not intended to replace advice given to you by your health care provider. Make sure you discuss any questions you have with your health care provider. Document Released: 11/15/2005 Document Revised: 04/22/2016 Document Reviewed: 09/07/2013 Elsevier Interactive Patient Education  2017 [employer]  

## 2016-10-19 NOTE — Progress Notes (Signed)
Subjective:  Patient ID: [name], female    DOB: [birth year]  Age: 65 y.o. MRN: [id number]  CC: Hypertension; Hyperlipidemia; and Cough   HPI [name] presents for follow-up. She complains of a cough productive of thick yellow phlegm for 3 days. She denies hemoptysis, chest pain, night sweats, fever, chills, shortness of breath, or wheezing.  She also tells me her blood pressure has not been well controlled and she has had a few headaches. She has been compliant with the ARB and hydrochlorothiazide. She has been under quite a bit of stress with a husband that has lung cancer and a niece that's been critically ill.  Outpatient Medications Prior to Visit  Medication Sig Dispense Refill  . esomeprazole (NEXIUM) 20 MG capsule Take 20 mg by mouth daily at 12 noon.    . Linaclotide (LINZESS) 145 MCG CAPS capsule Take 1 capsule (145 mcg total) by mouth daily. 90 capsule 3  . losartan-hydrochlorothiazide (HYZAAR) 100-12.5 MG per tablet Take 1 tablet by mouth daily. 90 tablet 3  . potassium chloride SA (K-DUR,KLOR-CON) 20 MEQ tablet Take 1 tablet (20 mEq total) by mouth 2 (two) times daily. 60 tablet 11  . Diclofenac Sodium 2 % SOLN Use a fingertip amount, or more as needed, to affected area twice daily 1 Bottle 2  . pravastatin (PRAVACHOL) 20 MG tablet TAKE 1 TABLET (20 MG TOTAL) BY MOUTH DAILY. 90 tablet 2  . levothyroxine (SYNTHROID, LEVOTHROID) 112 MCG tablet Take 1 tablet (112 mcg total) by mouth daily. 90 tablet 1  . clonazePAM (KLONOPIN) 0.5 MG tablet Take 1 tablet (0.5 mg total) by mouth 2 (two) times daily as needed for anxiety. 60 tablet 3  . levothyroxine (SYNTHROID, LEVOTHROID) 125 MCG tablet TAKE 1 TABLET (125 MCG TOTAL) BY MOUTH DAILY. 90 tablet 1   No facility-administered medications prior to visit.     ROS Review of Systems  Constitutional: Negative for chills, diaphoresis and fatigue.  HENT: Positive for sore throat. Negative for congestion, facial swelling, sinus  pressure and trouble swallowing.   Eyes: Negative for visual disturbance.  Respiratory: Positive for cough. Negative for chest tightness, shortness of breath and wheezing.   Cardiovascular: Negative for chest pain, palpitations and leg swelling.  Gastrointestinal: Negative.  Negative for abdominal pain, blood in stool, constipation, diarrhea, nausea and vomiting.  Endocrine: Negative.   Genitourinary: Negative.   Musculoskeletal: Negative.  Negative for back pain, myalgias and neck pain.  Skin: Negative.   Allergic/Immunologic: Negative.   Neurological: Positive for headaches. Negative for dizziness, tremors, seizures, syncope, facial asymmetry, weakness, light-headedness and numbness.  Hematological: Negative.  Negative for adenopathy. Does not bruise/bleed easily.  Psychiatric/Behavioral: Negative.     Objective:  BP (!) 160/88 (BP Location: Left Arm, Patient Position: Sitting, Cuff Size: Normal)   Pulse 70   Temp 98.1 F (36.7 C) (Oral)   Resp 16   Ht 5' 7"  (1.702 m)   Wt 188 lb 8 oz (85.5 kg)   SpO2 99%   BMI 29.52 kg/m   BP Readings from Last 3 Encounters:  10/19/16 (!) 160/88  09/22/16 130/80  06/14/16 120/78    Wt Readings from Last 3 Encounters:  10/19/16 188 lb 8 oz (85.5 kg)  09/22/16 186 lb (84.4 kg)  06/14/16 191 lb 8 oz (86.9 kg)    Physical Exam  Constitutional: She is oriented to person, place, and time. She appears well-developed and well-nourished.  Non-toxic appearance. She does not have a sickly appearance. She  does not appear ill. No distress.  HENT:  Mouth/Throat: Oropharynx is clear and moist. No oropharyngeal exudate.  Eyes: Conjunctivae are normal. Right eye exhibits no discharge. Left eye exhibits no discharge. No scleral icterus.  Neck: Normal range of motion. Neck supple. No JVD present. No tracheal deviation present. No thyromegaly present.  Cardiovascular: Normal rate, regular rhythm, normal heart sounds and intact distal pulses.  Exam reveals  no gallop and no friction rub.   No murmur heard. Pulmonary/Chest: Effort normal and breath sounds normal. No stridor. No respiratory distress. She has no wheezes. She has no rales. She exhibits no tenderness.  Abdominal: Soft. Bowel sounds are normal. She exhibits no distension and no mass. There is no tenderness. There is no rebound and no guarding.  Musculoskeletal: Normal range of motion. She exhibits no edema, tenderness or deformity.  Lymphadenopathy:    She has no cervical adenopathy.  Neurological: She is oriented to person, place, and time.  Skin: Skin is warm and dry. No rash noted. She is not diaphoretic. No erythema. No pallor.    Lab Results  Component Value Date   WBC 9.2 06/14/2016   HGB 13.1 06/14/2016   HCT 38.9 06/14/2016   PLT 325.0 06/14/2016   GLUCOSE 107 (H) 10/19/2016   CHOL 161 10/19/2016   TRIG 208.0 (H) 10/19/2016   HDL 40.60 10/19/2016   LDLDIRECT 88.0 10/19/2016   LDLCALC 78 11/25/2015   ALT 9 10/19/2016   AST 14 10/19/2016   NA 141 10/19/2016   K 3.5 10/19/2016   CL 100 10/19/2016   CREATININE 0.75 10/19/2016   BUN 9 10/19/2016   CO2 34 (H) 10/19/2016   TSH 1.30 10/19/2016   INR 1.0 07/23/2009   HGBA1C 6.0 06/14/2016    Mm Screening Breast Tomo Bilateral  Result Date: 08/18/2016 CLINICAL DATA:  Screening. EXAM: 2D DIGITAL SCREENING BILATERAL MAMMOGRAM WITH CAD AND ADJUNCT TOMO COMPARISON:  Previous exam(s). ACR Breast Density Category b: There are scattered areas of fibroglandular density. FINDINGS: There are no findings suspicious for malignancy. Images were processed with CAD. IMPRESSION: No mammographic evidence of malignancy. A result letter of this screening mammogram will be mailed directly to the patient. RECOMMENDATION: Screening mammogram in one year. (Code:SM-B-01Y) BI-RADS CATEGORY  1: Negative. Electronically Signed   By: Nolon Nations M.D.   On: 08/18/2016 10:37   Dg Chest 2 View  Result Date: 10/19/2016 CLINICAL DATA:  Cough for  several weeks EXAM: CHEST  2 VIEW COMPARISON:  January 07, 2016 FINDINGS: There is no edema or consolidation. Heart size and pulmonary vascularity are normal. No adenopathy. There is degenerative change in the thoracic spine. There is postoperative change in the lower cervical spine. IMPRESSION: No edema or consolidation. Electronically Signed   By: Lowella Grip III M.D.   On: 10/19/2016 08:58    Assessment & Plan:   Atheena was seen today for hypertension, hyperlipidemia and cough.  Diagnoses and all orders for this visit:  Acquired hypothyroidism- Her TSH is in the normal range, we'll continue the current dose of levothyroxine. -     TSH; Future  HYPOKALEMIA, MILD- some improvement noted but her potassium level is still slightly low. She has not been very compliant with potassium replacement therapy recently. I've asked her to be more diligent about that. -     Comprehensive metabolic panel; Future -     Magnesium; Future  Hyperlipidemia with target LDL less than 100- she is achieved her LDL goal is doing well on the  statin. -     Lipid panel; Future  Essential hypertension, benign- her blood pressure is not adequately well controlled. Her labs do not indicate any evidence of secondary metabolic causes or end organ damage. I've asked her to add nebivolol to the current regimen of an ARB and thiazide diuretic. -     Comprehensive metabolic panel; Future -     Magnesium; Future -     nebivolol (BYSTOLIC) 5 MG tablet; Take 1 tablet (5 mg total) by mouth daily.  Pure hyperglyceridemia- improvement noted -     Lipid panel; Future  Need for prophylactic vaccination and inoculation against influenza -     Flu vaccine HIGH DOSE PF (Fluzone High dose)  Cough- her chest x-ray is normal, see below -     DG Chest 2 View; Future  RTI (respiratory tract infection)- I will treat the infection with Omnicef. -     cefdinir (OMNICEF) 300 MG capsule; Take 1 capsule (300 mg total) by mouth 2 (two)  times daily.   I have discontinued [name]'s clonazePAM and Diclofenac Sodium. I am also having her start on cefdinir and nebivolol. Additionally, I am having her maintain her losartan-hydrochlorothiazide, esomeprazole, linaclotide, levothyroxine, and potassium chloride SA.  Meds ordered this encounter  Medications  . cefdinir (OMNICEF) 300 MG capsule    Sig: Take 1 capsule (300 mg total) by mouth 2 (two) times daily.    Dispense:  20 capsule    Refill:  1  . nebivolol (BYSTOLIC) 5 MG tablet    Sig: Take 1 tablet (5 mg total) by mouth daily.    Dispense:  42 tablet    Refill:  0     Follow-up: Return in about 6 weeks (around 11/30/2016).  [name], MD

## 2016-10-19 NOTE — Telephone Encounter (Signed)
Called [facility] and confirmed that the last time that patient picked up the 125 mcg of levothyroxin was in July. There was one more refill for 125 mch but I have discontinued it. Pharmacist that patient has been picking up the 112 mcg and the last time that it was picked up was on 09/16/2016.

## 2016-10-19 NOTE — Progress Notes (Signed)
Pre visit review using our clinic review tool, if applicable. No additional management support is needed unless otherwise documented below in the visit note. 

## 2016-10-21 ENCOUNTER — Other Ambulatory Visit: Payer: Self-pay | Admitting: Internal Medicine

## 2016-10-28 ENCOUNTER — Ambulatory Visit (INDEPENDENT_AMBULATORY_CARE_PROVIDER_SITE_OTHER): Payer: Medicare Other | Admitting: Gynecology

## 2016-10-28 ENCOUNTER — Encounter: Payer: Self-pay | Admitting: Gynecology

## 2016-10-28 ENCOUNTER — Encounter: Payer: Self-pay | Admitting: Internal Medicine

## 2016-10-28 VITALS — BP 118/80 | Ht 67.0 in | Wt 182.0 lb

## 2016-10-28 DIAGNOSIS — Z01419 Encounter for gynecological examination (general) (routine) without abnormal findings: Secondary | ICD-10-CM | POA: Diagnosis not present

## 2016-10-28 DIAGNOSIS — M8580 Other specified disorders of bone density and structure, unspecified site: Secondary | ICD-10-CM

## 2016-10-28 DIAGNOSIS — Z780 Asymptomatic menopausal state: Secondary | ICD-10-CM

## 2016-10-28 NOTE — Patient Instructions (Signed)
Bone Densitometry Introduction Bone densitometry is an imaging test that uses a special X-ray to measure the amount of calcium and other minerals in your bones (bone density). This test is also known as a bone mineral density test or dual-energy X-ray absorptiometry (DXA). The test can measure bone density at your hip and your spine. It is similar to having a regular X-ray. You may have this test to:  Diagnose a condition that causes weak or thin bones (osteoporosis).  Predict your risk of a broken bone (fracture).  Determine how well osteoporosis treatment is working. Tell a health care provider about:  Any allergies you have.  All medicines you are taking, including vitamins, herbs, eye drops, creams, and over-the-counter medicines.  Any problems you or family members have had with anesthetic medicines.  Any blood disorders you have.  Any surgeries you have had.  Any medical conditions you have.  Possibility of pregnancy.  Any other medical test you had within the previous 14 days that used contrast material. What are the risks? Generally, this is a safe procedure. However, problems can occur and may include the following:  This test exposes you to a very small amount of radiation.  The risks of radiation exposure may be greater to unborn children. What happens before the procedure?  Do not take any calcium supplements for 24 hours before having the test. You can otherwise eat and drink what you usually do.  Take off all metal jewelry, eyeglasses, dental appliances, and any other metal objects. What happens during the procedure?  You may lie on an exam table. There will be an X-ray generator below you and an imaging device above you.  Other devices, such as boxes or braces, may be used to position your body properly for the scan.  You will need to lie still while the machine slowly scans your body.  The images will show up on a computer monitor. What happens after the  procedure? You may need more testing at a later time. This information is not intended to replace advice given to you by your health care provider. Make sure you discuss any questions you have with your health care provider. Document Released: 12/07/2004 Document Revised: 04/22/2016 Document Reviewed: 04/25/2014  2017 Elsevier

## 2016-10-28 NOTE — Addendum Note (Signed)
Addended by: Burnett Kanaris on: 10/28/2016 10:40 AM   Modules accepted: Orders

## 2016-10-28 NOTE — Progress Notes (Signed)
GICELA SCHWARTING [birth year] [id number]   History:    65 y.o.  for annual gyn exam with no complaints today her PCP is Dr. Jeneen Rinks who is been doing her blood work and all her vaccines are up-to-date. Review of her record indicated she had a colonoscopy in 2012 benign polyps were removed she will be scheduled for one in the next month. Her last bone density study that indicated she was osteopenic and was done in 2015. She has never had any abnormal Pap smears. She does have a past history of total abdominal hysterectomy bilateral salpingo-oophorectomy. Patient has not been on any hormone replacement therapy.  Past medical history,surgical history, family history and social history were all reviewed and documented in the EPIC chart.  Gynecologic History No LMP recorded. Patient has had a hysterectomy. Contraception: post menopausal status Last Pap: 2012 and 2015. Results were: normal Last mammogram: 2017. Results were: normal  Obstetric History OB History  Gravida Para Term Preterm AB Living  3 2     1 2   SAB TAB Ectopic Multiple Live Births  1            # Outcome Date GA Lbr Len/2nd Weight Sex Delivery Anes PTL Lv  3 SAB           2 Para           1 Para                ROS: A ROS was performed and pertinent positives and negatives are included in the history.  GENERAL: No fevers or chills. HEENT: No change in vision, no earache, sore throat or sinus congestion. NECK: No pain or stiffness. CARDIOVASCULAR: No chest pain or pressure. No palpitations. PULMONARY: No shortness of breath, cough or wheeze. GASTROINTESTINAL: No abdominal pain, nausea, vomiting or diarrhea, melena or bright red blood per rectum. GENITOURINARY: No urinary frequency, urgency, hesitancy or dysuria. MUSCULOSKELETAL: No joint or muscle pain, no back pain, no recent trauma. DERMATOLOGIC: No rash, no itching, no lesions. ENDOCRINE: No polyuria, polydipsia, no heat or cold intolerance. No recent change in weight.  HEMATOLOGICAL: No anemia or easy bruising or bleeding. NEUROLOGIC: No headache, seizures, numbness, tingling or weakness. PSYCHIATRIC: No depression, no loss of interest in normal activity or change in sleep pattern.     Exam: chaperone present  BP 118/80   Ht 5' 7"  (1.702 m)   Wt 182 lb (82.6 kg)   BMI 28.51 kg/m   Body mass index is 28.51 kg/m.  General appearance : Well developed well nourished female. No acute distress HEENT: Eyes: no retinal hemorrhage or exudates,  Neck supple, trachea midline, no carotid bruits, no thyroidmegaly Lungs: Clear to auscultation, no rhonchi or wheezes, or rib retractions  Heart: Regular rate and rhythm, no murmurs or gallops Breast:Examined in sitting and supine position were symmetrical in appearance, no palpable masses or tenderness,  no skin retraction, no nipple inversion, no nipple discharge, no skin discoloration, no axillary or supraclavicular lymphadenopathy Abdomen: no palpable masses or tenderness, no rebound or guarding Extremities: no edema or skin discoloration or tenderness  Pelvic:  Bartholin, Urethra, Skene Glands: Within normal limits             Vagina: No gross lesions or discharge, atrophic changes  Cervix: Absent  Uterus  absent  Adnexa  Without masses or tenderness  Anus and perineum  normal   Rectovaginal  normal sphincter tone without palpated masses or tenderness  Hemoccult PCP provides     Assessment/Plan:  65 y.o. female for annual exam who will no longer need Pap smears after today according to the guidelines. She is now 34 and has had a previous hysterectomy and prior history of no abnormal Pap smear. She was provided with a requisition to schedule her bone density study and she was reminded contact her gas urologist to schedule her colonoscopy which is due next month. We discussed importance of calcium vitamin D on a regular basis along with weightbearing exercises for osteoporosis  prevention.   Terrance Mass MD, 9:53 AM 10/28/2016

## 2016-11-01 LAB — PAP IG W/ RFLX HPV ASCU

## 2016-11-04 MED ORDER — LEVOTHYROXINE SODIUM 112 MCG PO TABS: 112.0000 ug | ORAL_TABLET | Freq: Every day | ORAL | 1 refills | Status: DC

## 2016-11-04 NOTE — Telephone Encounter (Signed)
Okay to refill the 112 mcg of levothyroxin?

## 2016-11-04 NOTE — Telephone Encounter (Signed)
yes

## 2016-11-04 NOTE — Telephone Encounter (Signed)
erx sent

## 2016-12-14 ENCOUNTER — Ambulatory Visit (INDEPENDENT_AMBULATORY_CARE_PROVIDER_SITE_OTHER): Payer: Medicare Other | Admitting: Internal Medicine

## 2016-12-14 ENCOUNTER — Encounter: Payer: Self-pay | Admitting: Internal Medicine

## 2016-12-14 VITALS — BP 124/80 | HR 67 | Temp 97.7°F | Resp 16 | Ht 67.0 in | Wt 184.5 lb

## 2016-12-14 DIAGNOSIS — J988 Other specified respiratory disorders: Secondary | ICD-10-CM | POA: Diagnosis not present

## 2016-12-14 DIAGNOSIS — I10 Essential (primary) hypertension: Secondary | ICD-10-CM | POA: Diagnosis not present

## 2016-12-14 DIAGNOSIS — R7303 Prediabetes: Secondary | ICD-10-CM

## 2016-12-14 LAB — POCT GLYCOSYLATED HEMOGLOBIN (HGB A1C): Hemoglobin A1C: 6.2

## 2016-12-14 LAB — POCT GLUCOSE (DEVICE FOR HOME USE): GLUCOSE FASTING, POC: 110 mg/dL — AB (ref 70–99)

## 2016-12-14 MED ORDER — PROMETHAZINE-DM 6.25-15 MG/5ML PO SYRP: 5.0000 mL | ORAL_SOLUTION | Freq: Four times a day (QID) | ORAL | 0 refills | Status: DC | PRN

## 2016-12-14 MED ORDER — AMOXICILLIN-POT CLAVULANATE 875-125 MG PO TABS: 1.0000 | ORAL_TABLET | Freq: Two times a day (BID) | ORAL | 0 refills | Status: AC

## 2016-12-14 NOTE — Patient Instructions (Signed)
Cough, Adult Coughing is a reflex that clears your throat and your airways. Coughing helps to heal and protect your lungs. It is normal to cough occasionally, but a cough that happens with other symptoms or lasts a long time may be a sign of a condition that needs treatment. A cough may last only 2-3 weeks (acute), or it may last longer than 8 weeks (chronic). What are the causes? Coughing is commonly caused by:  Breathing in substances that irritate your lungs.  A viral or bacterial respiratory infection.  Allergies.  Asthma.  Postnasal drip.  Smoking.  Acid backing up from the stomach into the esophagus (gastroesophageal reflux).  Certain medicines.  Chronic lung problems, including COPD (or rarely, lung cancer).  Other medical conditions such as heart failure.  Follow these instructions at home: Pay attention to any changes in your symptoms. Take these actions to help with your discomfort:  Take medicines only as told by your health care provider. ? If you were prescribed an antibiotic medicine, take it as told by your health care provider. Do not stop taking the antibiotic even if you start to feel better. ? Talk with your health care provider before you take a cough suppressant medicine.  Drink enough fluid to keep your urine clear or pale yellow.  If the air is dry, use a cold steam vaporizer or humidifier in your bedroom or your home to help loosen secretions.  Avoid anything that causes you to cough at work or at home.  If your cough is worse at night, try sleeping in a semi-upright position.  Avoid cigarette smoke. If you smoke, quit smoking. If you need help quitting, ask your health care provider.  Avoid caffeine.  Avoid alcohol.  Rest as needed.  Contact a health care provider if:  You have new symptoms.  You cough up pus.  Your cough does not get better after 2-3 weeks, or your cough gets worse.  You cannot control your cough with suppressant  medicines and you are losing sleep.  You develop pain that is getting worse or pain that is not controlled with pain medicines.  You have a fever.  You have unexplained weight loss.  You have night sweats. Get help right away if:  You cough up blood.  You have difficulty breathing.  Your heartbeat is very fast. This information is not intended to replace advice given to you by your health care provider. Make sure you discuss any questions you have with your health care provider. Document Released: 05/14/2011 Document Revised: 04/22/2016 Document Reviewed: 01/22/2015 Elsevier Interactive Patient Education  2017 Elsevier Inc.  

## 2016-12-14 NOTE — Progress Notes (Signed)
Subjective:  Patient ID: [name], female    DOB: [birth year]  Age: 66 y.o. MRN: [id number]  CC: Cough and Hypertension   HPI [name] presents for a blood pressure check and concerns about a 10 day history of cough that's productive of thick yellow/green phlegm with night sweats. She denies shortness of breath, wheezing, fever, chills, chest pain, or hemoptysis. She tells me her blood pressure has been well controlled.  Outpatient Medications Prior to Visit  Medication Sig Dispense Refill  . esomeprazole (NEXIUM) 20 MG capsule Take 20 mg by mouth as needed.     Marland Kitchen levothyroxine (SYNTHROID, LEVOTHROID) 112 MCG tablet Take 1 tablet (112 mcg total) by mouth daily. 90 tablet 1  . Linaclotide (LINZESS) 145 MCG CAPS capsule Take 1 capsule (145 mcg total) by mouth daily. 90 capsule 3  . losartan-hydrochlorothiazide (HYZAAR) 100-12.5 MG per tablet Take 1 tablet by mouth daily. 90 tablet 3  . nebivolol (BYSTOLIC) 5 MG tablet Take 1 tablet (5 mg total) by mouth daily. 42 tablet 0  . potassium chloride SA (K-DUR,KLOR-CON) 20 MEQ tablet Take 1 tablet (20 mEq total) by mouth 2 (two) times daily. 60 tablet 11  . pravastatin (PRAVACHOL) 20 MG tablet TAKE 1 TABLET (20 MG TOTAL) BY MOUTH DAILY. 90 tablet 2  . cefdinir (OMNICEF) 300 MG capsule Take 300 mg by mouth 2 (two) times daily.     No facility-administered medications prior to visit.     ROS Review of Systems  Constitutional: Negative for activity change, appetite change, chills, diaphoresis, fatigue and fever.  HENT: Negative.  Negative for sinus pressure, sore throat and trouble swallowing.   Eyes: Negative.   Respiratory: Positive for cough. Negative for chest tightness, shortness of breath, wheezing and stridor.   Cardiovascular: Negative for chest pain, palpitations and leg swelling.  Gastrointestinal: Negative for abdominal pain, constipation, diarrhea, nausea and vomiting.  Endocrine: Negative.   Genitourinary: Negative.     Musculoskeletal: Negative for back pain, myalgias and neck pain.  Skin: Negative for color change and rash.  Allergic/Immunologic: Negative.   Neurological: Negative.   Hematological: Negative.  Negative for adenopathy. Does not bruise/bleed easily.  Psychiatric/Behavioral: Negative.     Objective:  BP 124/80 (BP Location: Left Arm, Patient Position: Sitting, Cuff Size: Normal)   Pulse 67   Temp 97.7 F (36.5 C) (Oral)   Resp 16   Ht 5' 7"  (1.702 m)   Wt 184 lb 8 oz (83.7 kg)   SpO2 97%   BMI 28.90 kg/m   BP Readings from Last 3 Encounters:  12/14/16 124/80  10/28/16 118/80  10/19/16 (!) 160/88    Wt Readings from Last 3 Encounters:  12/14/16 184 lb 8 oz (83.7 kg)  10/28/16 182 lb (82.6 kg)  10/19/16 188 lb 8 oz (85.5 kg)    Physical Exam  Constitutional: She is oriented to person, place, and time. No distress.  HENT:  Mouth/Throat: Oropharynx is clear and moist. No oropharyngeal exudate.  Eyes: Conjunctivae are normal. Right eye exhibits no discharge. Left eye exhibits no discharge. No scleral icterus.  Neck: Normal range of motion. Neck supple. No JVD present. No tracheal deviation present. No thyromegaly present.  Cardiovascular: Normal rate, regular rhythm, normal heart sounds and intact distal pulses.  Exam reveals no gallop and no friction rub.   No murmur heard. Pulmonary/Chest: Effort normal and breath sounds normal. No stridor. No respiratory distress. She has no wheezes. She has no rales. She exhibits no tenderness.  Abdominal: Soft. Bowel sounds are normal. She exhibits no distension and no mass. There is no tenderness. There is no rebound and no guarding.  Musculoskeletal: Normal range of motion. She exhibits no edema, tenderness or deformity.  Lymphadenopathy:    She has no cervical adenopathy.  Neurological: She is oriented to person, place, and time.  Skin: Skin is warm and dry. No rash noted. She is not diaphoretic. No erythema. No pallor.  Vitals  reviewed.   Lab Results  Component Value Date   WBC 9.2 06/14/2016   HGB 13.1 06/14/2016   HCT 38.9 06/14/2016   PLT 325.0 06/14/2016   GLUCOSE 107 (H) 10/19/2016   CHOL 161 10/19/2016   TRIG 208.0 (H) 10/19/2016   HDL 40.60 10/19/2016   LDLDIRECT 88.0 10/19/2016   LDLCALC 78 11/25/2015   ALT 9 10/19/2016   AST 14 10/19/2016   NA 141 10/19/2016   K 3.5 10/19/2016   CL 100 10/19/2016   CREATININE 0.75 10/19/2016   BUN 9 10/19/2016   CO2 34 (H) 10/19/2016   TSH 1.30 10/19/2016   INR 1.0 07/23/2009   HGBA1C 6.2 12/14/2016    Dg Chest 2 View  Result Date: 10/19/2016 CLINICAL DATA:  Cough for several weeks EXAM: CHEST  2 VIEW COMPARISON:  January 07, 2016 FINDINGS: There is no edema or consolidation. Heart size and pulmonary vascularity are normal. No adenopathy. There is degenerative change in the thoracic spine. There is postoperative change in the lower cervical spine. IMPRESSION: No edema or consolidation. Electronically Signed   By: Lowella Grip III M.D.   On: 10/19/2016 08:58    Assessment & Plan:   [name] was seen today for cough and hypertension.  Diagnoses and all orders for this visit:  Essential hypertension, benign- her blood pressure is adequately well-controlled  Prediabetes- her A1c is up to 6.2%, she has worsening prediabetes but no medications are needed at this time, she agrees to be more diligent with her lifestyle modifications. -     POCT Glucose (Device for Home Use) -     POCT glycosylated hemoglobin (Hb A1C)  RTI (respiratory tract infection) -     amoxicillin-clavulanate (AUGMENTIN) 875-125 MG tablet; Take 1 tablet by mouth 2 (two) times daily. -     promethazine-dextromethorphan (PROMETHAZINE-DM) 6.25-15 MG/5ML syrup; Take 5 mLs by mouth 4 (four) times daily as needed for cough.   I have discontinued [name]'s cefdinir. I am also having her start on amoxicillin-clavulanate and promethazine-dextromethorphan. Additionally, I am having her  maintain her losartan-hydrochlorothiazide, esomeprazole, linaclotide, potassium chloride SA, nebivolol, pravastatin, and levothyroxine.  Meds ordered this encounter  Medications  . amoxicillin-clavulanate (AUGMENTIN) 875-125 MG tablet    Sig: Take 1 tablet by mouth 2 (two) times daily.    Dispense:  20 tablet    Refill:  0  . promethazine-dextromethorphan (PROMETHAZINE-DM) 6.25-15 MG/5ML syrup    Sig: Take 5 mLs by mouth 4 (four) times daily as needed for cough.    Dispense:  118 mL    Refill:  0     Follow-up: Return in about 4 weeks (around 01/11/2017).  [name], MD

## 2016-12-14 NOTE — Progress Notes (Signed)
Pre visit review using our clinic review tool, if applicable. No additional management support is needed unless otherwise documented below in the visit note. 

## 2016-12-20 ENCOUNTER — Ambulatory Visit (AMBULATORY_SURGERY_CENTER): Payer: Self-pay | Admitting: *Deleted

## 2016-12-20 VITALS — Ht 67.0 in | Wt 180.0 lb

## 2016-12-20 DIAGNOSIS — Z86010 Personal history of colonic polyps: Secondary | ICD-10-CM

## 2016-12-20 MED ORDER — NA SULFATE-K SULFATE-MG SULF 17.5-3.13-1.6 GM/177ML PO SOLN: ORAL | 0 refills | Status: DC

## 2016-12-20 NOTE — Progress Notes (Signed)
Patient denies any allergies to eggs or soy. Patient denies any problems with anesthesia/sedation. Patient denies any oxygen use at home and does not take any diet/weight loss medications. Patient was very upset and crying during PV today, she states her husband pass away Saturday. Encouraged patient to call us back if she has any questions about instructions.

## 2017-01-03 ENCOUNTER — Encounter: Payer: Self-pay | Admitting: Internal Medicine

## 2017-01-03 ENCOUNTER — Ambulatory Visit (AMBULATORY_SURGERY_CENTER): Payer: Medicare Other | Admitting: Internal Medicine

## 2017-01-03 VITALS — BP 144/59 | HR 68 | Temp 95.7°F | Resp 12 | Ht 67.0 in | Wt 180.0 lb

## 2017-01-03 DIAGNOSIS — D122 Benign neoplasm of ascending colon: Secondary | ICD-10-CM

## 2017-01-03 DIAGNOSIS — I10 Essential (primary) hypertension: Secondary | ICD-10-CM | POA: Diagnosis not present

## 2017-01-03 DIAGNOSIS — D124 Benign neoplasm of descending colon: Secondary | ICD-10-CM | POA: Diagnosis not present

## 2017-01-03 DIAGNOSIS — D120 Benign neoplasm of cecum: Secondary | ICD-10-CM | POA: Diagnosis not present

## 2017-01-03 DIAGNOSIS — Z1211 Encounter for screening for malignant neoplasm of colon: Secondary | ICD-10-CM | POA: Diagnosis not present

## 2017-01-03 DIAGNOSIS — Z86010 Personal history of colonic polyps: Secondary | ICD-10-CM

## 2017-01-03 DIAGNOSIS — E039 Hypothyroidism, unspecified: Secondary | ICD-10-CM | POA: Diagnosis not present

## 2017-01-03 LAB — HM COLONOSCOPY

## 2017-01-03 MED ORDER — SODIUM CHLORIDE 0.9 % IV SOLN: 500.0000 mL | INTRAVENOUS | Status: DC

## 2017-01-03 NOTE — Progress Notes (Signed)
Called to room to assist during endoscopic procedure.  Patient ID and intended procedure confirmed with present staff. Received instructions for my participation in the procedure from the performing physician.  

## 2017-01-03 NOTE — Progress Notes (Signed)
Pt's states no medical or surgical changes since previsit or office visit. 

## 2017-01-03 NOTE — Op Note (Signed)
[facility] Patient Name: [name] Procedure Date: 01/03/2017 9:13 AM MRN: [id number] Endoscopist: Jerene Bears , MD Age: 66 Referring MD:  Date of Birth: [birth year] Gender: Female Account #: [id number] Procedure:                Colonoscopy Indications:              Surveillance: Personal history of adenomatous                            polyps on last colonoscopy 5 years ago Medicines:                Monitored Anesthesia Care Procedure:                Pre-Anesthesia Assessment:                           - Prior to the procedure, a History and Physical                            was performed, and patient medications and                            allergies were reviewed. The patient's tolerance of                            previous anesthesia was also reviewed. The risks                            and benefits of the procedure and the sedation                            options and risks were discussed with the patient.                            All questions were answered, and informed consent                            was obtained. Prior Anticoagulants: The patient has                            taken no previous anticoagulant or antiplatelet                            agents. ASA Grade Assessment: III - A patient with                            severe systemic disease. After reviewing the risks                            and benefits, the patient was deemed in                            satisfactory condition to undergo the procedure.  After obtaining informed consent, the colonoscope                            was passed under direct vision. Throughout the                            procedure, the patient's blood pressure, pulse, and                            oxygen saturations were monitored continuously. The                            Model PCF-H190L [id number] scope was introduced                            through the anus and  advanced to the the terminal                            ileum. The colonoscopy was technically difficult                            and complex due to significant looping and a                            tortuous colon. Successful completion of the                            procedure was aided by applying abdominal pressure.                            The patient tolerated the procedure well. The                            quality of the bowel preparation was good. The                            terminal ileum, ileocecal valve, appendiceal                            orifice, and rectum were photographed. Scope In: 9:27:54 AM Scope Out: 9:58:08 AM Scope Withdrawal Time: 0 hours 19 minutes 36 seconds  Total Procedure Duration: 0 hours 30 minutes 14 seconds  Findings:                 The perianal and digital rectal examinations were                            normal.                           The terminal ileum appeared normal.                           A 6 mm polyp was found in the cecum. The polyp was  sessile. The polyp was removed with a cold snare.                            Resection and retrieval were complete.                           A 15 mm polyp was found in the ascending colon. The                            polyp was semi-pedunculated. The polyp was removed                            with a hot snare. Resection and retrieval were                            complete.                           A 5 mm polyp was found in the descending colon. The                            polyp was sessile. The polyp was removed with a                            cold snare. Resection and retrieval were complete.                           Internal hemorrhoids were found during                            retroflexion. The hemorrhoids were medium-sized. Complications:            No immediate complications. Estimated Blood Loss:     Estimated blood loss was  minimal. Impression:               - The examined portion of the ileum was normal.                           - One 6 mm polyp in the cecum, removed with a cold                            snare. Resected and retrieved.                           - One 15 mm polyp in the ascending colon, removed                            with a hot snare. Resected and retrieved.                           - One 5 mm polyp in the descending colon, removed                            with  a cold snare. Resected and retrieved.                           - Internal hemorrhoids. Recommendation:           - Patient has a contact number available for                            emergencies. The signs and symptoms of potential                            delayed complications were discussed with the                            patient. Return to normal activities tomorrow.                            Written discharge instructions were provided to the                            patient.                           - Resume previous diet.                           - Continue present medications.                           - Await pathology results.                           - Repeat colonoscopy is recommended for                            surveillance. The colonoscopy date will be                            determined after pathology results from today's                            exam become available for review.                           - No ibuprofen, naproxen, or other non-steroidal                            anti-inflammatory drugs for 2 weeks after polyp                            removal. Jerene Bears, MD 01/03/2017 10:01:50 AM This report has been signed electronically.

## 2017-01-03 NOTE — Progress Notes (Signed)
Report given to PACU, vss 

## 2017-01-03 NOTE — Patient Instructions (Signed)
YOU HAD AN ENDOSCOPIC PROCEDURE TODAY AT [facility]:   Refer to the procedure report that was given to you for any specific questions about what was found during the examination.  If the procedure report does not answer your questions, please call your gastroenterologist to clarify.  If you requested that your care partner not be given the details of your procedure findings, then the procedure report has been included in a sealed envelope for you to review at your convenience later.  YOU SHOULD EXPECT: Some feelings of bloating in the abdomen. Passage of more gas than usual.  Walking can help get rid of the air that was put into your GI tract during the procedure and reduce the bloating. If you had a lower endoscopy (such as a colonoscopy or flexible sigmoidoscopy) you may notice spotting of blood in your stool or on the toilet paper. If you underwent a bowel prep for your procedure, you may not have a normal bowel movement for a few days.  Please Note:  You might notice some irritation and congestion in your nose or some drainage.  This is from the oxygen used during your procedure.  There is no need for concern and it should clear up in a day or so.  SYMPTOMS TO REPORT IMMEDIATELY:   Following lower endoscopy (colonoscopy or flexible sigmoidoscopy):  Excessive amounts of blood in the stool  Significant tenderness or worsening of abdominal pains  Swelling of the abdomen that is new, acute  Fever of 100F or higher  For urgent or emergent issues, a gastroenterologist can be reached at any hour by calling [phone].   DIET:  We do recommend a small meal at first, but then you may proceed to your regular diet.  Drink plenty of fluids but you should avoid alcoholic beverages for 24 hours.  NO ibuprofen, naproxen, or other non-steroidal anti-inflammatory drugs for 2 weeks after polyp removal.  ACTIVITY:  You should plan to take it easy for the rest of today and you should NOT  DRIVE or use heavy machinery until tomorrow (because of the sedation medicines used during the test).    FOLLOW UP: Our staff will call the number listed on your records the next business day following your procedure to check on you and address any questions or concerns that you may have regarding the information given to you following your procedure. If we do not reach you, we will leave a message.  However, if you are feeling well and you are not experiencing any problems, there is no need to return our call.  We will assume that you have returned to your regular daily activities without incident.  If any biopsies were taken you will be contacted by phone or by letter within the next 1-3 weeks.  Please call us at [phone] if you have not heard about the biopsies in 3 weeks.   Thank you for allowing [place] to take care of your healthcare needs today.  SIGNATURES/CONFIDENTIALITY: You and/or your care partner have signed paperwork which will be entered into your electronic medical record.  These signatures attest to the fact that that the information above on your After Visit Summary has been reviewed and is understood.  Full responsibility of the confidentiality of this discharge information lies with you and/or your care-partner.

## 2017-01-04 ENCOUNTER — Telehealth: Payer: Self-pay

## 2017-01-04 NOTE — Telephone Encounter (Signed)
  Follow up Call-  Call back number 01/03/2017  Post procedure Call Back phone  # [phone]  Permission to leave phone message Yes  Some recent data might be hidden     Patient questions:  Do you have a fever, pain , or abdominal swelling? No. Pain Score  0 *  Have you tolerated food without any problems? Yes.    Have you been able to return to your normal activities? Yes.    Do you have any questions about your discharge instructions: Diet   No. Medications  No. Follow up visit  No.  Do you have questions or concerns about your Care? No.  Actions: * If pain score is 4 or above: No action needed, pain <4.

## 2017-01-07 ENCOUNTER — Encounter: Payer: Self-pay | Admitting: Internal Medicine

## 2017-01-11 ENCOUNTER — Ambulatory Visit (INDEPENDENT_AMBULATORY_CARE_PROVIDER_SITE_OTHER): Payer: Medicare Other | Admitting: Internal Medicine

## 2017-01-11 ENCOUNTER — Encounter: Payer: Self-pay | Admitting: Internal Medicine

## 2017-01-11 ENCOUNTER — Telehealth: Payer: Self-pay | Admitting: Internal Medicine

## 2017-01-11 VITALS — BP 126/76 | HR 72 | Temp 97.8°F | Resp 16 | Ht 67.0 in | Wt 183.8 lb

## 2017-01-11 DIAGNOSIS — F411 Generalized anxiety disorder: Secondary | ICD-10-CM

## 2017-01-11 DIAGNOSIS — K5901 Slow transit constipation: Secondary | ICD-10-CM

## 2017-01-11 DIAGNOSIS — I10 Essential (primary) hypertension: Secondary | ICD-10-CM | POA: Diagnosis not present

## 2017-01-11 DIAGNOSIS — E032 Hypothyroidism due to medicaments and other exogenous substances: Secondary | ICD-10-CM

## 2017-01-11 DIAGNOSIS — J301 Allergic rhinitis due to pollen: Secondary | ICD-10-CM

## 2017-01-11 MED ORDER — LOSARTAN POTASSIUM-HCTZ 100-12.5 MG PO TABS: 1.0000 | ORAL_TABLET | Freq: Every day | ORAL | 3 refills | Status: DC

## 2017-01-11 MED ORDER — CLONAZEPAM 0.5 MG PO TABS: 0.5000 mg | ORAL_TABLET | Freq: Two times a day (BID) | ORAL | 2 refills | Status: DC | PRN

## 2017-01-11 MED ORDER — FLUTICASONE PROPIONATE 50 MCG/ACT NA SUSP: 2.0000 | Freq: Every day | NASAL | 11 refills | Status: DC

## 2017-01-11 MED ORDER — AZELASTINE HCL 0.1 % NA SOLN: 2.0000 | Freq: Two times a day (BID) | NASAL | 11 refills | Status: DC

## 2017-01-11 NOTE — Patient Instructions (Signed)
Hypertension Hypertension, commonly called high blood pressure, is when the force of blood pumping through your arteries is too strong. Your arteries are the blood vessels that carry blood from your heart throughout your body. A blood pressure reading consists of a higher number over a lower number, such as 110/72. The higher number (systolic) is the pressure inside your arteries when your heart pumps. The lower number (diastolic) is the pressure inside your arteries when your heart relaxes. Ideally you want your blood pressure below 120/80. Hypertension forces your heart to work harder to pump blood. Your arteries may become narrow or stiff. Having untreated or uncontrolled hypertension can cause heart attack, stroke, kidney disease, and other problems. What increases the risk? Some risk factors for high blood pressure are controllable. Others are not. Risk factors you cannot control include:  Race. You may be at higher risk if you are African American.  Age. Risk increases with age.  Gender. Men are at higher risk than women before age 45 years. After age 65, women are at higher risk than men. Risk factors you can control include:  Not getting enough exercise or physical activity.  Being overweight.  Getting too much fat, sugar, calories, or salt in your diet.  Drinking too much alcohol. What are the signs or symptoms? Hypertension does not usually cause signs or symptoms. Extremely high blood pressure (hypertensive crisis) may cause headache, anxiety, shortness of breath, and nosebleed. How is this diagnosed? To check if you have hypertension, your health care provider will measure your blood pressure while you are seated, with your arm held at the level of your heart. It should be measured at least twice using the same arm. Certain conditions can cause a difference in blood pressure between your right and left arms. A blood pressure reading that is higher than normal on one occasion does  not mean that you need treatment. If it is not clear whether you have high blood pressure, you may be asked to return on a different day to have your blood pressure checked again. Or, you may be asked to monitor your blood pressure at home for 1 or more weeks. How is this treated? Treating high blood pressure includes making lifestyle changes and possibly taking medicine. Living a healthy lifestyle can help lower high blood pressure. You may need to change some of your habits. Lifestyle changes may include:  Following the DASH diet. This diet is high in fruits, vegetables, and whole grains. It is low in salt, red meat, and added sugars.  Keep your sodium intake below 2,300 mg per day.  Getting at least 30-45 minutes of aerobic exercise at least 4 times per week.  Losing weight if necessary.  Not smoking.  Limiting alcoholic beverages.  Learning ways to reduce stress. Your health care provider may prescribe medicine if lifestyle changes are not enough to get your blood pressure under control, and if one of the following is true:  You are 18-59 years of age and your systolic blood pressure is above 140.  You are 60 years of age or older, and your systolic blood pressure is above 150.  Your diastolic blood pressure is above 90.  You have diabetes, and your systolic blood pressure is over 140 or your diastolic blood pressure is over 90.  You have kidney disease and your blood pressure is above 140/90.  You have heart disease and your blood pressure is above 140/90. Your personal target blood pressure may vary depending on your medical   conditions, your age, and other factors. Follow these instructions at home:  Have your blood pressure rechecked as directed by your health care provider.  Take medicines only as directed by your health care provider. Follow the directions carefully. Blood pressure medicines must be taken as prescribed. The medicine does not work as well when you skip  doses. Skipping doses also puts you at risk for problems.  Do not smoke.  Monitor your blood pressure at home as directed by your health care provider. Contact a health care provider if:  You think you are having a reaction to medicines taken.  You have recurrent headaches or feel dizzy.  You have swelling in your ankles.  You have trouble with your vision. Get help right away if:  You develop a severe headache or confusion.  You have unusual weakness, numbness, or feel faint.  You have severe chest or abdominal pain.  You vomit repeatedly.  You have trouble breathing. This information is not intended to replace advice given to you by your health care provider. Make sure you discuss any questions you have with your health care provider. Document Released: 11/15/2005 Document Revised: 04/22/2016 Document Reviewed: 09/07/2013 Elsevier Interactive Patient Education  2017 [employer]  

## 2017-01-11 NOTE — Progress Notes (Signed)
Pre visit review using our clinic review tool, if applicable. No additional management support is needed unless otherwise documented below in the visit note. 

## 2017-01-11 NOTE — Telephone Encounter (Signed)
Patient states during her visit she spoke to Dr. [name] about a nasal spray.  Would like to know if he is going to send this to her pharmacy?

## 2017-01-11 NOTE — Progress Notes (Signed)
Subjective:  Patient ID: [name], female    DOB: 1951/11/29  Age: 66 y.o. MRN: [id number]  CC: Hypertension and Hypothyroidism   HPI [name] presents for follow-up, she complains of anxiety. She lost her husband about a month ago due to cancer. She is having an appropriate grief response.   She complains of nasal congestion and postnasal drip.  Outpatient Medications Prior to Visit  Medication Sig Dispense Refill  . esomeprazole (NEXIUM) 20 MG capsule Take 20 mg by mouth as needed.     Marland Kitchen levothyroxine (SYNTHROID, LEVOTHROID) 112 MCG tablet Take 1 tablet (112 mcg total) by mouth daily. 90 tablet 1  . Linaclotide (LINZESS) 145 MCG CAPS capsule Take 1 capsule (145 mcg total) by mouth daily. 90 capsule 3  . potassium chloride SA (K-DUR,KLOR-CON) 20 MEQ tablet Take 1 tablet (20 mEq total) by mouth 2 (two) times daily. 60 tablet 11  . pravastatin (PRAVACHOL) 20 MG tablet TAKE 1 TABLET (20 MG TOTAL) BY MOUTH DAILY. 90 tablet 2  . losartan-hydrochlorothiazide (HYZAAR) 100-12.5 MG per tablet Take 1 tablet by mouth daily. 90 tablet 3  . nebivolol (BYSTOLIC) 5 MG tablet Take 1 tablet (5 mg total) by mouth daily. 42 tablet 0  . promethazine-dextromethorphan (PROMETHAZINE-DM) 6.25-15 MG/5ML syrup Take 5 mLs by mouth 4 (four) times daily as needed for cough. (Patient not taking: Reported on 01/11/2017) 118 mL 0  . 0.9 %  sodium chloride infusion      No facility-administered medications prior to visit.     ROS Review of Systems  Constitutional: Negative.  Negative for appetite change, diaphoresis, fatigue and unexpected weight change.  HENT: Positive for congestion, postnasal drip and rhinorrhea. Negative for sinus pressure, sneezing and sore throat.   Eyes: Negative for visual disturbance.  Respiratory: Negative for cough, chest tightness, shortness of breath and wheezing.   Cardiovascular: Negative for chest pain, palpitations and leg swelling.  Gastrointestinal: Positive for  constipation. Negative for abdominal pain, diarrhea, nausea and vomiting.       Her constipation has improved with Linzess  Endocrine: Negative.  Negative for cold intolerance and heat intolerance.  Genitourinary: Negative.  Negative for difficulty urinating.  Musculoskeletal: Negative.  Negative for arthralgias, back pain, myalgias and neck pain.  Skin: Negative for color change and rash.  Allergic/Immunologic: Negative.   Neurological: Negative.  Negative for dizziness, weakness and headaches.  Hematological: Negative for adenopathy. Does not bruise/bleed easily.  Psychiatric/Behavioral: Negative for agitation, confusion, decreased concentration, dysphoric mood, sleep disturbance and suicidal ideas. The patient is nervous/anxious.     Objective:  BP 126/76 (BP Location: Left Arm, Patient Position: Sitting, Cuff Size: Normal)   Pulse 72   Temp 97.8 F (36.6 C) (Oral)   Resp 16   Ht 5' 7"  (1.702 m)   Wt 183 lb 12 oz (83.3 kg)   SpO2 96%   BMI 28.78 kg/m   BP Readings from Last 3 Encounters:  01/11/17 126/76  01/03/17 (!) 144/59  12/14/16 124/80    Wt Readings from Last 3 Encounters:  01/11/17 183 lb 12 oz (83.3 kg)  01/03/17 180 lb (81.6 kg)  12/20/16 180 lb (81.6 kg)    Physical Exam  Constitutional: She is oriented to person, place, and time. No distress.  HENT:  Nose: Mucosal edema and rhinorrhea present. No epistaxis. Right sinus exhibits no maxillary sinus tenderness and no frontal sinus tenderness. Left sinus exhibits no maxillary sinus tenderness and no frontal sinus tenderness.  Mouth/Throat: Oropharynx is  clear and moist. No oropharyngeal exudate.  Eyes: Conjunctivae are normal. Right eye exhibits no discharge. Left eye exhibits no discharge. No scleral icterus.  Neck: Normal range of motion. Neck supple. No JVD present. No tracheal deviation present. No thyromegaly present.  Cardiovascular: Normal rate, regular rhythm, normal heart sounds and intact distal pulses.   Exam reveals no gallop and no friction rub.   No murmur heard. Pulmonary/Chest: Effort normal and breath sounds normal. No stridor. No respiratory distress. She has no wheezes. She has no rales. She exhibits no tenderness.  Abdominal: Soft. Bowel sounds are normal. She exhibits no distension and no mass. There is no tenderness. There is no rebound and no guarding.  Musculoskeletal: Normal range of motion. She exhibits no edema, tenderness or deformity.  Lymphadenopathy:    She has no cervical adenopathy.  Neurological: She is oriented to person, place, and time.  Skin: Skin is warm and dry. No rash noted. She is not diaphoretic. No erythema. No pallor.  Vitals reviewed.   Lab Results  Component Value Date   WBC 9.2 06/14/2016   HGB 13.1 06/14/2016   HCT 38.9 06/14/2016   PLT 325.0 06/14/2016   GLUCOSE 107 (H) 10/19/2016   CHOL 161 10/19/2016   TRIG 208.0 (H) 10/19/2016   HDL 40.60 10/19/2016   LDLDIRECT 88.0 10/19/2016   LDLCALC 78 11/25/2015   ALT 9 10/19/2016   AST 14 10/19/2016   NA 141 10/19/2016   K 3.5 10/19/2016   CL 100 10/19/2016   CREATININE 0.75 10/19/2016   BUN 9 10/19/2016   CO2 34 (H) 10/19/2016   TSH 1.30 10/19/2016   INR 1.0 07/23/2009   HGBA1C 6.2 12/14/2016    Dg Chest 2 View  Result Date: 10/19/2016 CLINICAL DATA:  Cough for several weeks EXAM: CHEST  2 VIEW COMPARISON:  January 07, 2016 FINDINGS: There is no edema or consolidation. Heart size and pulmonary vascularity are normal. No adenopathy. There is degenerative change in the thoracic spine. There is postoperative change in the lower cervical spine. IMPRESSION: No edema or consolidation. Electronically Signed   By: Lowella Grip III M.D.   On: 10/19/2016 08:58    Assessment & Plan:   Idaly was seen today for hypertension and hypothyroidism.  Diagnoses and all orders for this visit:  Essential hypertension, benign- her blood pressure is adequately well controlled -      losartan-hydrochlorothiazide (HYZAAR) 100-12.5 MG tablet; Take 1 tablet by mouth daily.  Constipation, slow transit- improvement noted  Hypothyroidism due to medication- she will remain on the current levothyroxine dose.  Generalized anxiety disorder -     clonazePAM (KLONOPIN) 0.5 MG tablet; Take 1 tablet (0.5 mg total) by mouth 2 (two) times daily as needed for anxiety.  Acute nonseasonal allergic rhinitis due to pollen -     fluticasone (FLONASE) 50 MCG/ACT nasal spray; Place 2 sprays into both nostrils daily. -     azelastine (ASTELIN) 0.1 % nasal spray; Place 2 sprays into both nostrils 2 (two) times daily. Use in each nostril as directed   I have discontinued Ms. Burda's nebivolol and promethazine-dextromethorphan. I have also changed her losartan-hydrochlorothiazide. Additionally, I am having her start on clonazePAM, fluticasone, and azelastine. Lastly, I am having her maintain her esomeprazole, linaclotide, potassium chloride SA, pravastatin, and levothyroxine. We will stop administering sodium chloride.  Meds ordered this encounter  Medications  . losartan-hydrochlorothiazide (HYZAAR) 100-12.5 MG tablet    Sig: Take 1 tablet by mouth daily.    Dispense:  90 tablet    Refill:  3  . clonazePAM (KLONOPIN) 0.5 MG tablet    Sig: Take 1 tablet (0.5 mg total) by mouth 2 (two) times daily as needed for anxiety.    Dispense:  60 tablet    Refill:  2  . fluticasone (FLONASE) 50 MCG/ACT nasal spray    Sig: Place 2 sprays into both nostrils daily.    Dispense:  16 g    Refill:  11  . azelastine (ASTELIN) 0.1 % nasal spray    Sig: Place 2 sprays into both nostrils 2 (two) times daily. Use in each nostril as directed    Dispense:  30 mL    Refill:  11     Follow-up: Return in about 6 months (around 07/11/2017).  [name], MD

## 2017-01-12 ENCOUNTER — Telehealth: Payer: Self-pay | Admitting: Internal Medicine

## 2017-01-12 NOTE — Telephone Encounter (Signed)
Left message for patient to call back.  Patient wanted to know if she owed the $149.03.  Looks like medicare states she has a deductible she has to meet.  Looks like [employer] has not paid on this bill but we have sent to them.  She may want to follow up with [employer] to see what kind of plane she is in.  Also, there is $160.99 sent to collection that she needs to follow up on with billing as we can not see details on this.

## 2017-01-12 NOTE — Telephone Encounter (Signed)
Pt informed rx has been sent in.  

## 2017-01-18 DIAGNOSIS — H2513 Age-related nuclear cataract, bilateral: Secondary | ICD-10-CM | POA: Diagnosis not present

## 2017-01-28 DIAGNOSIS — H25811 Combined forms of age-related cataract, right eye: Secondary | ICD-10-CM | POA: Diagnosis not present

## 2017-01-28 DIAGNOSIS — H25812 Combined forms of age-related cataract, left eye: Secondary | ICD-10-CM | POA: Diagnosis not present

## 2017-02-08 DIAGNOSIS — H2511 Age-related nuclear cataract, right eye: Secondary | ICD-10-CM | POA: Diagnosis not present

## 2017-02-08 DIAGNOSIS — H2513 Age-related nuclear cataract, bilateral: Secondary | ICD-10-CM | POA: Diagnosis not present

## 2017-02-08 DIAGNOSIS — H25811 Combined forms of age-related cataract, right eye: Secondary | ICD-10-CM | POA: Diagnosis not present

## 2017-03-11 DIAGNOSIS — M542 Cervicalgia: Secondary | ICD-10-CM | POA: Diagnosis not present

## 2017-03-11 DIAGNOSIS — M5136 Other intervertebral disc degeneration, lumbar region: Secondary | ICD-10-CM | POA: Diagnosis not present

## 2017-03-15 DIAGNOSIS — H2512 Age-related nuclear cataract, left eye: Secondary | ICD-10-CM | POA: Diagnosis not present

## 2017-03-15 DIAGNOSIS — H2513 Age-related nuclear cataract, bilateral: Secondary | ICD-10-CM | POA: Diagnosis not present

## 2017-03-15 DIAGNOSIS — H25812 Combined forms of age-related cataract, left eye: Secondary | ICD-10-CM | POA: Diagnosis not present

## 2017-04-13 ENCOUNTER — Encounter: Payer: Self-pay | Admitting: Gynecology

## 2017-05-14 ENCOUNTER — Other Ambulatory Visit: Payer: Self-pay | Admitting: Internal Medicine

## 2017-05-14 DIAGNOSIS — E038 Other specified hypothyroidism: Secondary | ICD-10-CM

## 2017-05-27 ENCOUNTER — Other Ambulatory Visit: Payer: Self-pay | Admitting: Internal Medicine

## 2017-06-06 ENCOUNTER — Other Ambulatory Visit: Payer: Self-pay | Admitting: Internal Medicine

## 2017-07-07 ENCOUNTER — Other Ambulatory Visit: Payer: Self-pay | Admitting: Internal Medicine

## 2017-07-07 DIAGNOSIS — Z1231 Encounter for screening mammogram for malignant neoplasm of breast: Secondary | ICD-10-CM

## 2017-07-11 ENCOUNTER — Encounter: Payer: Self-pay | Admitting: Internal Medicine

## 2017-07-11 ENCOUNTER — Ambulatory Visit (INDEPENDENT_AMBULATORY_CARE_PROVIDER_SITE_OTHER): Payer: Medicare Other | Admitting: Internal Medicine

## 2017-07-11 ENCOUNTER — Telehealth: Payer: Self-pay

## 2017-07-11 ENCOUNTER — Other Ambulatory Visit (INDEPENDENT_AMBULATORY_CARE_PROVIDER_SITE_OTHER): Payer: Medicare Other

## 2017-07-11 VITALS — BP 112/70 | HR 72 | Temp 98.2°F | Resp 16 | Ht 67.0 in | Wt 178.1 lb

## 2017-07-11 DIAGNOSIS — E669 Obesity, unspecified: Secondary | ICD-10-CM

## 2017-07-11 DIAGNOSIS — R7303 Prediabetes: Secondary | ICD-10-CM

## 2017-07-11 DIAGNOSIS — E038 Other specified hypothyroidism: Secondary | ICD-10-CM

## 2017-07-11 DIAGNOSIS — E785 Hyperlipidemia, unspecified: Secondary | ICD-10-CM

## 2017-07-11 DIAGNOSIS — E781 Pure hyperglyceridemia: Secondary | ICD-10-CM

## 2017-07-11 DIAGNOSIS — I10 Essential (primary) hypertension: Secondary | ICD-10-CM

## 2017-07-11 LAB — COMPREHENSIVE METABOLIC PANEL
ALT: 9 U/L (ref 0–35)
AST: 14 U/L (ref 0–37)
Albumin: 4.2 g/dL (ref 3.5–5.2)
Alkaline Phosphatase: 76 U/L (ref 39–117)
BILIRUBIN TOTAL: 1.6 mg/dL — AB (ref 0.2–1.2)
BUN: 10 mg/dL (ref 6–23)
CALCIUM: 9.6 mg/dL (ref 8.4–10.5)
CHLORIDE: 99 meq/L (ref 96–112)
CO2: 33 meq/L — AB (ref 19–32)
Creatinine, Ser: 0.78 mg/dL (ref 0.40–1.20)
GFR: 94.99 mL/min (ref >60.00)
GLUCOSE: 117 mg/dL — AB (ref 70–99)
POTASSIUM: 3.7 meq/L (ref 3.5–5.1)
Sodium: 140 mEq/L (ref 135–145)
Total Protein: 7.2 g/dL (ref 6.0–8.3)

## 2017-07-11 LAB — CBC WITH DIFFERENTIAL/PLATELET
BASOS ABS: 0.1 10*3/uL (ref 0.0–0.1)
Basophils Relative: 0.8 % (ref 0.0–3.0)
EOS PCT: 1.4 % (ref 0.0–5.0)
Eosinophils Absolute: 0.1 10*3/uL (ref 0.0–0.7)
HEMATOCRIT: 38.4 % (ref 36.0–46.0)
Hemoglobin: 12.8 g/dL (ref 12.0–15.0)
LYMPHS PCT: 33.1 % (ref 12.0–46.0)
Lymphs Abs: 2.7 10*3/uL (ref 0.7–4.0)
MCHC: 33.5 g/dL (ref 30.0–36.0)
MCV: 81.5 fl (ref 78.0–100.0)
MONOS PCT: 6.1 % (ref 3.0–12.0)
Monocytes Absolute: 0.5 10*3/uL (ref 0.1–1.0)
Neutro Abs: 4.7 10*3/uL (ref 1.4–7.7)
Neutrophils Relative %: 58.6 % (ref 43.0–77.0)
Platelets: 333 10*3/uL (ref 150.0–400.0)
RBC: 4.71 Mil/uL (ref 3.87–5.11)
RDW: 13.9 % (ref 11.5–15.5)
WBC: 8.1 10*3/uL (ref 4.0–10.5)

## 2017-07-11 LAB — HEMOGLOBIN A1C: Hgb A1c MFr Bld: 5.9 % (ref 4.6–6.5)

## 2017-07-11 LAB — LIPID PANEL
Cholesterol: 146 mg/dL (ref 0–200)
HDL: 36.4 mg/dL — AB (ref >39.00)
LDL CALC: 74 mg/dL (ref 0–99)
NONHDL: 110.05
TRIGLYCERIDES: 180 mg/dL — AB (ref 0.0–149.0)
Total CHOL/HDL Ratio: 4
VLDL: 36 mg/dL (ref 0.0–40.0)

## 2017-07-11 LAB — TSH: TSH: 1.13 u[IU]/mL (ref 0.35–4.50)

## 2017-07-11 NOTE — Progress Notes (Signed)
Subjective:  Patient ID: [name], female    DOB: 1951/05/23  Age: 66 y.o. MRN: [id number]  CC: Hyperlipidemia; Hypertension; and Hypothyroidism   HPI [name] presents for f/up - she feels well today. She is lost about 5 pounds since her last visit with lifestyle modifications. She complains of chronic knee pain and occasional episode of constipation but offers no new complaints today.  Outpatient Medications Prior to Visit  Medication Sig Dispense Refill  . clonazePAM (KLONOPIN) 0.5 MG tablet Take 1 tablet (0.5 mg total) by mouth 2 (two) times daily as needed for anxiety. 60 tablet 2  . esomeprazole (NEXIUM) 20 MG capsule Take 20 mg by mouth as needed.     Marland Kitchen levothyroxine (SYNTHROID, LEVOTHROID) 112 MCG tablet TAKE 1 TABLET (112 MCG TOTAL) BY MOUTH DAILY. 90 tablet 1  . Linaclotide (LINZESS) 145 MCG CAPS capsule Take 1 capsule (145 mcg total) by mouth daily. 90 capsule 3  . losartan-hydrochlorothiazide (HYZAAR) 100-12.5 MG tablet Take 1 tablet by mouth daily. 90 tablet 3  . potassium chloride SA (K-DUR,KLOR-CON) 20 MEQ tablet Take 1 tablet (20 mEq total) by mouth 2 (two) times daily. 60 tablet 11  . pravastatin (PRAVACHOL) 20 MG tablet TAKE 1 TABLET (20 MG TOTAL) BY MOUTH DAILY. 90 tablet 2  . azelastine (ASTELIN) 0.1 % nasal spray Place 2 sprays into both nostrils 2 (two) times daily. Use in each nostril as directed 30 mL 11  . fluticasone (FLONASE) 50 MCG/ACT nasal spray Place 2 sprays into both nostrils daily. 16 g 11  . losartan-hydrochlorothiazide (HYZAAR) 100-12.5 MG tablet TAKE 1 TABLET BY MOUTH DAILY. 90 tablet 1   No facility-administered medications prior to visit.     ROS Review of Systems  Constitutional: Negative for appetite change, chills, diaphoresis, fatigue and unexpected weight change.  HENT: Negative.   Eyes: Negative for visual disturbance.  Respiratory: Negative.  Negative for chest tightness, shortness of breath and wheezing.   Cardiovascular:  Negative for chest pain, palpitations and leg swelling.  Gastrointestinal: Positive for constipation. Negative for abdominal pain, blood in stool, diarrhea, nausea and vomiting.  Endocrine: Negative.  Negative for cold intolerance and heat intolerance.  Genitourinary: Negative.  Negative for difficulty urinating, dysuria, hematuria and urgency.  Musculoskeletal: Positive for arthralgias (B knee pain). Negative for back pain and myalgias.  Skin: Negative.   Allergic/Immunologic: Negative.   Neurological: Negative.  Negative for dizziness.  Hematological: Negative for adenopathy. Does not bruise/bleed easily.  Psychiatric/Behavioral: Negative.     Objective:  BP 112/70 (BP Location: Left Arm, Patient Position: Sitting, Cuff Size: Normal)   Pulse 72   Temp 98.2 F (36.8 C) (Oral)   Resp 16   Ht 5' 7"  (1.702 m)   Wt 178 lb 1 oz (80.8 kg)   SpO2 99%   BMI 27.89 kg/m   BP Readings from Last 3 Encounters:  07/11/17 112/70  01/11/17 126/76  01/03/17 (!) 144/59    Wt Readings from Last 3 Encounters:  07/11/17 178 lb 1 oz (80.8 kg)  01/11/17 183 lb 12 oz (83.3 kg)  01/03/17 180 lb (81.6 kg)    Physical Exam  Constitutional: She is oriented to person, place, and time. No distress.  HENT:  Mouth/Throat: Oropharynx is clear and moist. No oropharyngeal exudate.  Eyes: Conjunctivae are normal. Right eye exhibits no discharge. Left eye exhibits no discharge. No scleral icterus.  Neck: Normal range of motion. Neck supple. No JVD present. No thyromegaly present.  Cardiovascular: Normal  rate, regular rhythm and intact distal pulses.  Exam reveals no gallop and no friction rub.   No murmur heard. Pulmonary/Chest: Effort normal and breath sounds normal. No respiratory distress. She has no wheezes. She has no rales. She exhibits no tenderness.  Abdominal: Soft. Bowel sounds are normal. She exhibits no distension and no mass. There is no tenderness. There is no rebound and no guarding.    Musculoskeletal: Normal range of motion. She exhibits no edema or tenderness.  Lymphadenopathy:    She has no cervical adenopathy.  Neurological: She is alert and oriented to person, place, and time.  Skin: Skin is warm and dry. No rash noted. She is not diaphoretic. No erythema. No pallor.  Vitals reviewed.   Lab Results  Component Value Date   WBC 8.1 07/11/2017   HGB 12.8 07/11/2017   HCT 38.4 07/11/2017   PLT 333.0 07/11/2017   GLUCOSE 117 (H) 07/11/2017   CHOL 146 07/11/2017   TRIG 180.0 (H) 07/11/2017   HDL 36.40 (L) 07/11/2017   LDLDIRECT 88.0 10/19/2016   LDLCALC 74 07/11/2017   ALT 9 07/11/2017   AST 14 07/11/2017   NA 140 07/11/2017   K 3.7 07/11/2017   CL 99 07/11/2017   CREATININE 0.78 07/11/2017   BUN 10 07/11/2017   CO2 33 (H) 07/11/2017   TSH 1.13 07/11/2017   INR 1.0 07/23/2009   HGBA1C 5.9 07/11/2017    Dg Chest 2 View  Result Date: 10/19/2016 CLINICAL DATA:  Cough for several weeks EXAM: CHEST  2 VIEW COMPARISON:  January 07, 2016 FINDINGS: There is no edema or consolidation. Heart size and pulmonary vascularity are normal. No adenopathy. There is degenerative change in the thoracic spine. There is postoperative change in the lower cervical spine. IMPRESSION: No edema or consolidation. Electronically Signed   By: Lowella Grip III M.D.   On: 10/19/2016 08:58    Assessment & Plan:   [name] was seen today for hyperlipidemia, hypertension and hypothyroidism.  Diagnoses and all orders for this visit:  Essential hypertension, benign- Her blood pressure is well controlled. Electrolytes and renal function are normal. -     Comprehensive metabolic panel; Future -     CBC with Differential/Platelet; Future  Other specified hypothyroidism- her TSH is in the normal range. She will remain on the current dose of levothyroxine. -     TSH; Future  Obesity (BMI 30.0-34.9)- she was praised for her success with weight loss using lifestyle modifications.  Pure  hyperglyceridemia- improvement noted -     Lipid panel; Future  Prediabetes- improvement noted -     Hemoglobin A1c; Future  Hyperlipidemia with target LDL less than 100- she has achieved her LDL goal is doing well on the statin. -     Lipid panel; Future   I have discontinued Ms. Tinch's fluticasone and azelastine. I am also having her maintain her esomeprazole, linaclotide, potassium chloride SA, pravastatin, losartan-hydrochlorothiazide, clonazePAM, and levothyroxine.  No orders of the defined types were placed in this encounter.    Follow-up: Return in about 6 months (around 01/11/2018).  [name], MD

## 2017-07-11 NOTE — Telephone Encounter (Signed)
error 

## 2017-07-11 NOTE — Patient Instructions (Signed)
Hypothyroidism Hypothyroidism is a disorder of the thyroid. The thyroid is a large gland that is located in the lower front of the neck. The thyroid releases hormones that control how the body works. With hypothyroidism, the thyroid does not make enough of these hormones. What are the causes? Causes of hypothyroidism may include:  Viral infections.  Pregnancy.  Your own defense system (immune system) attacking your thyroid.  Certain medicines.  Birth defects.  Past radiation treatments to your head or neck.  Past treatment with radioactive iodine.  Past surgical removal of part or all of your thyroid.  Problems with the gland that is located in the center of your brain (pituitary).  What are the signs or symptoms? Signs and symptoms of hypothyroidism may include:  Feeling as though you have no energy (lethargy).  Inability to tolerate cold.  Weight gain that is not explained by a change in diet or exercise habits.  Dry skin.  Coarse hair.  Menstrual irregularity.  Slowing of thought processes.  Constipation.  Sadness or depression.  How is this diagnosed? Your health care provider may diagnose hypothyroidism with blood tests and ultrasound tests. How is this treated? Hypothyroidism is treated with medicine that replaces the hormones that your body does not make. After you begin treatment, it may take several weeks for symptoms to go away. Follow these instructions at home:  Take medicines only as directed by your health care provider.  If you start taking any new medicines, tell your health care provider.  Keep all follow-up visits as directed by your health care provider. This is important. As your condition improves, your dosage needs may change. You will need to have blood tests regularly so that your health care provider can watch your condition. Contact a health care provider if:  Your symptoms do not get better with treatment.  You are taking thyroid  replacement medicine and: ? You sweat excessively. ? You have tremors. ? You feel anxious. ? You lose weight rapidly. ? You cannot tolerate heat. ? You have emotional swings. ? You have diarrhea. ? You feel weak. Get help right away if:  You develop chest pain.  You develop an irregular heartbeat.  You develop a rapid heartbeat. This information is not intended to replace advice given to you by your health care provider. Make sure you discuss any questions you have with your health care provider. Document Released: 11/15/2005 Document Revised: 04/22/2016 Document Reviewed: 04/02/2014 Elsevier Interactive Patient Education  2017 [employer]  

## 2017-08-11 ENCOUNTER — Encounter: Payer: Self-pay | Admitting: Family Medicine

## 2017-08-11 ENCOUNTER — Ambulatory Visit (INDEPENDENT_AMBULATORY_CARE_PROVIDER_SITE_OTHER)
Admission: RE | Admit: 2017-08-11 | Discharge: 2017-08-11 | Disposition: A | Discharge status: Home / Self Care | Payer: Medicare Other | Source: Ambulatory Visit | Attending: Family Medicine | Admitting: Family Medicine

## 2017-08-11 ENCOUNTER — Ambulatory Visit (INDEPENDENT_AMBULATORY_CARE_PROVIDER_SITE_OTHER): Payer: Medicare Other | Admitting: Family Medicine

## 2017-08-11 VITALS — BP 130/90 | HR 70 | Ht 66.0 in | Wt 182.0 lb

## 2017-08-11 DIAGNOSIS — S8012XA Contusion of left lower leg, initial encounter: Secondary | ICD-10-CM | POA: Diagnosis not present

## 2017-08-11 DIAGNOSIS — M79605 Pain in left leg: Secondary | ICD-10-CM | POA: Diagnosis not present

## 2017-08-11 DIAGNOSIS — S8992XA Unspecified injury of left lower leg, initial encounter: Secondary | ICD-10-CM | POA: Diagnosis not present

## 2017-08-11 NOTE — Patient Instructions (Addendum)
Good to see you  [name] is your friend.  Xray downstairs Ice 20 minutes 2 times daily. Usually after activity and before bed. pennsaid pinkie amount topically 2 times daily as needed.   Over the counter arnica lotion to the leg 2 times a day can help with bruising.  We will make sure you are doing better and see me again in 2-3 weeks.

## 2017-08-11 NOTE — Assessment & Plan Note (Signed)
Contusion of the left leg. We will get x-rays to rule out any type of fracture that could be contributing. We discussed icing regimen, home exercises, which activities to do a which ones to avoid. Topical anti-inflammatories given. If worsening symptoms we'll consider further imaging. Discussed that this can be more of a lumbar radiculopathy or greater trochanter bursitis. Patient will follow-up again in 2-3 weeks to make sure patient is improving.
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
## 2017-08-18 ENCOUNTER — Ambulatory Visit
Admission: RE | Admit: 2017-08-18 | Discharge: 2017-08-18 | Disposition: A | Discharge status: Home / Self Care | Payer: Medicare Other | Source: Ambulatory Visit | Attending: Internal Medicine | Admitting: Internal Medicine

## 2017-08-18 DIAGNOSIS — Z1231 Encounter for screening mammogram for malignant neoplasm of breast: Secondary | ICD-10-CM

## 2017-08-18 LAB — HM MAMMOGRAPHY

## 2017-08-29 ENCOUNTER — Ambulatory Visit (INDEPENDENT_AMBULATORY_CARE_PROVIDER_SITE_OTHER): Payer: Medicare Other | Admitting: Family Medicine

## 2017-08-29 ENCOUNTER — Ambulatory Visit (INDEPENDENT_AMBULATORY_CARE_PROVIDER_SITE_OTHER)
Admission: RE | Admit: 2017-08-29 | Discharge: 2017-08-29 | Disposition: A | Discharge status: Home / Self Care | Payer: Medicare Other | Source: Ambulatory Visit | Attending: Family Medicine | Admitting: Family Medicine

## 2017-08-29 ENCOUNTER — Encounter: Payer: Self-pay | Admitting: Family Medicine

## 2017-08-29 VITALS — BP 120/80 | HR 64 | Ht 67.0 in | Wt 183.0 lb

## 2017-08-29 DIAGNOSIS — M79605 Pain in left leg: Secondary | ICD-10-CM

## 2017-08-29 DIAGNOSIS — M545 Low back pain: Secondary | ICD-10-CM | POA: Diagnosis not present

## 2017-08-29 DIAGNOSIS — M7062 Trochanteric bursitis, left hip: Secondary | ICD-10-CM

## 2017-08-29 DIAGNOSIS — S3992XA Unspecified injury of lower back, initial encounter: Secondary | ICD-10-CM | POA: Diagnosis not present

## 2017-08-29 DIAGNOSIS — M5136 Other intervertebral disc degeneration, lumbar region: Secondary | ICD-10-CM

## 2017-08-29 MED ORDER — GABAPENTIN 100 MG PO CAPS: 200.0000 mg | ORAL_CAPSULE | Freq: Every day | ORAL | 3 refills | Status: DC
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
## 2017-08-29 NOTE — Patient Instructions (Addendum)
Good to see you  Gabapentin 278m at night Xray of your back today  GT injected and I hope it helps See me again in 3 weeks.
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
## 2017-08-30 ENCOUNTER — Other Ambulatory Visit: Payer: Self-pay | Admitting: Internal Medicine

## 2017-09-12 DIAGNOSIS — M5416 Radiculopathy, lumbar region: Secondary | ICD-10-CM | POA: Diagnosis not present

## 2017-09-12 DIAGNOSIS — M5440 Lumbago with sciatica, unspecified side: Secondary | ICD-10-CM | POA: Diagnosis not present

## 2017-09-14 ENCOUNTER — Other Ambulatory Visit: Payer: Self-pay | Admitting: Neurosurgery

## 2017-09-14 DIAGNOSIS — M5440 Lumbago with sciatica, unspecified side: Secondary | ICD-10-CM
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
## 2017-09-19 ENCOUNTER — Encounter: Payer: Self-pay | Admitting: Family Medicine

## 2017-09-19 ENCOUNTER — Ambulatory Visit (INDEPENDENT_AMBULATORY_CARE_PROVIDER_SITE_OTHER): Payer: Medicare Other | Admitting: Family Medicine

## 2017-09-19 VITALS — BP 130/82 | HR 63 | Ht 67.0 in | Wt 181.0 lb

## 2017-09-19 DIAGNOSIS — M5136 Other intervertebral disc degeneration, lumbar region: Secondary | ICD-10-CM

## 2017-09-19 DIAGNOSIS — M7062 Trochanteric bursitis, left hip: Secondary | ICD-10-CM | POA: Diagnosis not present

## 2017-09-19 NOTE — Patient Instructions (Signed)
Good to see you  Ice 20 minutes 2 times daily. Usually after activity and before bed. We will get MRI of your hip as well. Make sure there is not a occult fracture We will try to get it at the same time as your back MRI Depending on what we see then we will discuss next step

## 2017-09-19 NOTE — Assessment & Plan Note (Signed)
Patient has not made any significant improvement. MRI of the left hip in the back is ordered. I do think that something could be causing either nerve impingement or patient may have an occult fracture. Patient seems to be worsening if anything. Patient is having difficulty overall. Daily activities are affected. Not responding to the injection as much as we were hoping. Patient will follow-up with me again after the MRIs we'll discuss further treatment options.

## 2017-10-03 ENCOUNTER — Ambulatory Visit
Admission: RE | Admit: 2017-10-03 | Discharge: 2017-10-03 | Disposition: A | Discharge status: Home / Self Care | Payer: Medicare Other | Source: Ambulatory Visit | Attending: Family Medicine | Admitting: Family Medicine

## 2017-10-03 ENCOUNTER — Ambulatory Visit
Admission: RE | Admit: 2017-10-03 | Discharge: 2017-10-03 | Disposition: A | Discharge status: Home / Self Care | Payer: Medicare Other | Source: Ambulatory Visit | Attending: Neurosurgery | Admitting: Neurosurgery

## 2017-10-03 ENCOUNTER — Other Ambulatory Visit: Payer: Medicare Other

## 2017-10-03 DIAGNOSIS — M5440 Lumbago with sciatica, unspecified side: Secondary | ICD-10-CM

## 2017-10-03 DIAGNOSIS — M7062 Trochanteric bursitis, left hip: Secondary | ICD-10-CM

## 2017-10-03 DIAGNOSIS — M160 Bilateral primary osteoarthritis of hip: Secondary | ICD-10-CM | POA: Diagnosis not present

## 2017-10-03 DIAGNOSIS — M5126 Other intervertebral disc displacement, lumbar region: Secondary | ICD-10-CM | POA: Diagnosis not present

## 2017-10-04 DIAGNOSIS — G8929 Other chronic pain: Secondary | ICD-10-CM | POA: Diagnosis not present

## 2017-10-04 DIAGNOSIS — Z6828 Body mass index (BMI) 28.0-28.9, adult: Secondary | ICD-10-CM | POA: Diagnosis not present

## 2017-10-04 DIAGNOSIS — M545 Low back pain: Secondary | ICD-10-CM | POA: Diagnosis not present

## 2017-10-04 DIAGNOSIS — I10 Essential (primary) hypertension: Secondary | ICD-10-CM | POA: Diagnosis not present

## 2017-10-05 ENCOUNTER — Telehealth: Payer: Self-pay | Admitting: Family Medicine

## 2017-10-05 NOTE — Telephone Encounter (Signed)
Patient states her legs are still hurting. She wanted to know what else she could do. Please follow up with patient. Thank you.

## 2017-10-06 NOTE — Telephone Encounter (Signed)
Discussed with pt. Scheduled appt next Wednesday.

## 2017-10-06 NOTE — Telephone Encounter (Signed)
Might need to see her and consider getting labs and EMG.

## 2017-10-12 ENCOUNTER — Other Ambulatory Visit (INDEPENDENT_AMBULATORY_CARE_PROVIDER_SITE_OTHER): Payer: Medicare Other

## 2017-10-12 ENCOUNTER — Encounter: Payer: Self-pay | Admitting: Family Medicine

## 2017-10-12 ENCOUNTER — Ambulatory Visit (INDEPENDENT_AMBULATORY_CARE_PROVIDER_SITE_OTHER): Payer: Medicare Other | Admitting: Family Medicine

## 2017-10-12 VITALS — BP 128/82 | HR 60 | Ht 67.0 in | Wt 183.0 lb

## 2017-10-12 DIAGNOSIS — E039 Hypothyroidism, unspecified: Secondary | ICD-10-CM | POA: Diagnosis not present

## 2017-10-12 DIAGNOSIS — D509 Iron deficiency anemia, unspecified: Secondary | ICD-10-CM

## 2017-10-12 DIAGNOSIS — E559 Vitamin D deficiency, unspecified: Secondary | ICD-10-CM | POA: Diagnosis not present

## 2017-10-12 DIAGNOSIS — M79606 Pain in leg, unspecified: Secondary | ICD-10-CM

## 2017-10-12 DIAGNOSIS — M5136 Other intervertebral disc degeneration, lumbar region: Secondary | ICD-10-CM | POA: Diagnosis not present

## 2017-10-12 LAB — CBC WITH DIFFERENTIAL/PLATELET
BASOS PCT: 0.8 % (ref 0.0–3.0)
Basophils Absolute: 0.1 10*3/uL (ref 0.0–0.1)
EOS PCT: 1.5 % (ref 0.0–5.0)
Eosinophils Absolute: 0.1 10*3/uL (ref 0.0–0.7)
HEMATOCRIT: 39.3 % (ref 36.0–46.0)
Hemoglobin: 13.2 g/dL (ref 12.0–15.0)
LYMPHS ABS: 2.5 10*3/uL (ref 0.7–4.0)
LYMPHS PCT: 32.3 % (ref 12.0–46.0)
MCHC: 33.6 g/dL (ref 30.0–36.0)
MCV: 82.2 fl (ref 78.0–100.0)
MONOS PCT: 3.6 % (ref 3.0–12.0)
Monocytes Absolute: 0.3 10*3/uL (ref 0.1–1.0)
NEUTROS ABS: 4.8 10*3/uL (ref 1.4–7.7)
NEUTROS PCT: 61.8 % (ref 43.0–77.0)
PLATELETS: 339 10*3/uL (ref 150.0–400.0)
RBC: 4.78 Mil/uL (ref 3.87–5.11)
RDW: 14.2 % (ref 11.5–15.5)
WBC: 7.8 10*3/uL (ref 4.0–10.5)

## 2017-10-12 LAB — COMPREHENSIVE METABOLIC PANEL
ALT: 9 U/L (ref 0–35)
AST: 15 U/L (ref 0–37)
Albumin: 4.3 g/dL (ref 3.5–5.2)
Alkaline Phosphatase: 72 U/L (ref 39–117)
BILIRUBIN TOTAL: 1.2 mg/dL (ref 0.2–1.2)
BUN: 8 mg/dL (ref 6–23)
CALCIUM: 9.7 mg/dL (ref 8.4–10.5)
CO2: 33 meq/L — AB (ref 19–32)
Chloride: 99 mEq/L (ref 96–112)
Creatinine, Ser: 0.81 mg/dL (ref 0.40–1.20)
GFR: 90.87 mL/min (ref >60.00)
GLUCOSE: 145 mg/dL — AB (ref 70–99)
POTASSIUM: 3.1 meq/L — AB (ref 3.5–5.1)
Sodium: 142 mEq/L (ref 135–145)
Total Protein: 7.9 g/dL (ref 6.0–8.3)

## 2017-10-12 LAB — IBC PANEL
IRON: 78 ug/dL (ref 42–145)
SATURATION RATIOS: 22.1 % (ref 20.0–50.0)
TRANSFERRIN: 252 mg/dL (ref 212.0–360.0)

## 2017-10-12 LAB — SEDIMENTATION RATE: SED RATE: 19 mm/h (ref 0–30)

## 2017-10-12 LAB — VITAMIN D 25 HYDROXY (VIT D DEFICIENCY, FRACTURES): VITD: 19.38 ng/mL — ABNORMAL LOW (ref 30.00–100.00)

## 2017-10-12 LAB — TSH: TSH: 5.54 u[IU]/mL — ABNORMAL HIGH (ref 0.35–4.50)

## 2017-10-12 LAB — URIC ACID: Uric Acid, Serum: 6.4 mg/dL (ref 2.4–7.0)

## 2017-10-12 NOTE — Progress Notes (Signed)
[name] [place],  [address] Phone: [phone] Subjective:    I'm seeing this patient by the request  of:    CC: Left leg pain  [id number]  [name] is a 66 y.o. female coming in for follow up for left hip pain. Has had an MRI from her back doctor since last visit with [name].  She is coming in this morning from therapy. She states that it does seem to be helping.  Patient's MRI of the lumbar spine did show the patient previous fusion is intact.  This was adequately visualized by me.  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  Patient will continues to have pain overall.  With certain activities.  Patient is concerned though because she can never be without any pain.  Does feel once again that the physical therapy may be helping a little bit.     Past Medical History:  Diagnosis Date  . Anemia   . Arthritis   . Cholelithiasis   . Chronic back pain   . Constipation   . Esophageal reflux   . Fibromyalgia   . Hyperlipidemia   . Hypertension   . Hypothyroid   . IBS (irritable bowel syndrome)   . Internal hemorrhoids without mention of complication   . Personal history of colonic polyps 05/06/2010   ADENOMATOUS POLYP   Past Surgical History:  Procedure Laterality Date  . ABDOMINAL HYSTERECTOMY    . CERVICAL DISC SURGERY     PLATE IN NECK & BACK  . CHOLECYSTECTOMY    . COLONOSCOPY  09/27/2011   NORMAL   . LUMBAR DISC SURGERY    . TONSILLECTOMY     Social History   Socioeconomic History  . Marital status: Married    Spouse name: None  . Number of children: 2  . Years of education: None  . Highest education level: None  Social Needs  . Financial resource strain: None  . Food insecurity - worry: None  . Food insecurity - inability: None  . Transportation needs - medical: None  . Transportation needs - non-medical: None  Occupational History  . Occupation: housewife    Employer: UNEMPLOYED   Tobacco Use  .  Smoking status: Never Smoker  . Smokeless tobacco: Never Used  Substance and Sexual Activity  . Alcohol use: No    Alcohol/week: 0.0 oz  . Drug use: No  . Sexual activity: Not Currently  Other Topics Concern  . None  Social History Narrative   Daily Caffeine Use   Allergies  Allergen Reactions  . Livalo [Pitavastatin]     myalgias  . Aspirin   . Gadolinium      Code: HIVES, Desc: patient unsure of which kind of dye she had a reaction to until reaction to Magnevist - may be allergic to x-ray contrast, Onset Date: [id number]   . Hydrocodone-Acetaminophen   . Iohexol   . Lipitor [Atorvastatin]     REACTION: Myalgias  . Crestor [Rosuvastatin Calcium]     rash   Family History  Problem Relation Age of Onset  . Diabetes Mother        aunts and uncles  . Kidney disease Mother        stage 3  . Lung disease Mother        colapsed lung/in hospice  . COPD Mother   . Hypertension Mother   . Dementia Mother   . Breast cancer Maternal Aunt   .  Heart failure Father        Mother  . Breast cancer Cousin   . Colon cancer Neg Hx      Past medical history, social, surgical and family history all reviewed in electronic medical record.  No pertanent information unless stated regarding to the chief complaint.   Review of Systems:Review of systems updated and as accurate as of 10/12/17  No headache, visual changes, nausea, vomiting, diarrhea, constipation, dizziness, abdominal pain, skin rash, fevers, chills, night sweats, weight loss, swollen lymph nodes, body aches, joint swelling,chest pain, shortness of breath, mood changes.  Positive muscle aches and fatigue  Objective  Blood pressure 128/82, pulse 60, height 5' 7"  (1.702 m), weight 183 lb (83 kg), SpO2 94 %. Systems examined below as of 10/12/17   General: No apparent distress alert and oriented x3 mood and affect normal, dressed appropriately.  HEENT: Pupils equal, extraocular movements intact  Respiratory: Patient's speak in  full sentences and does not appear short of breath  Cardiovascular: No lower extremity edema, non tender, no erythema  Skin: Warm dry intact with no signs of infection or rash on extremities or on axial skeleton.  Abdomen: Soft nontender  Neuro: Cranial nerves II through XII are intact, neurovascularly intact in all extremities with 2+ DTRs and 2+ pulses.  Lymph: No lymphadenopathy of posterior or anterior cervical chain or axillae bilaterally.  Gait mild antalgic MSK:  Non tender with full range of motion and good stability and symmetric strength and tone of shoulders, elbows, wrist, hip, knee and ankles bilaterally.  Patient does have pain with extension of the leg but denies any radiation at this time.  Mild positive Faber test.  Seems to be neurovascularly intact distally.  More pain over the left thigh than anywhere else even to light palpation.  Fairly nonspecific findings   Impression and Recommendations:     This case required medical decision making of moderate complexity.      Note: This dictation was prepared with Dragon dictation along with smaller phrase technology. Any transcriptional errors that result from this process are unintentional.

## 2017-10-12 NOTE — Patient Instructions (Signed)
Good to see you  We will get labs today  Looked at your MRI and the back and the hip look really good.  Lets also get a EMG to check the nerve and they will call you  See me again in 4 weeks and we should have all that info
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
## 2017-10-13 ENCOUNTER — Other Ambulatory Visit: Payer: Self-pay | Admitting: *Deleted

## 2017-10-13 ENCOUNTER — Other Ambulatory Visit: Payer: Self-pay

## 2017-10-13 DIAGNOSIS — M069 Rheumatoid arthritis, unspecified: Secondary | ICD-10-CM

## 2017-10-13 DIAGNOSIS — M79605 Pain in left leg: Secondary | ICD-10-CM

## 2017-10-13 LAB — PTH, INTACT AND CALCIUM
Calcium: 9.4 mg/dL (ref 8.6–10.4)
PTH: 56 pg/mL (ref 14–64)

## 2017-10-13 LAB — ANGIOTENSIN CONVERTING ENZYME: ANGIOTENSIN-CONVERTING ENZYME: 39 U/L (ref 9–67)

## 2017-10-13 LAB — CYCLIC CITRUL PEPTIDE ANTIBODY, IGG

## 2017-10-13 LAB — ANA: ANA: NEGATIVE

## 2017-10-13 LAB — CALCIUM, IONIZED: CALCIUM ION: 5.2 mg/dL (ref 4.8–5.6)

## 2017-10-13 NOTE — Progress Notes (Signed)
Spoke with patient regarding referral to rheumatology. She is fine with the referral and has an appointment to speak with Dr. [name] further about her thyroid.

## 2017-10-17 ENCOUNTER — Ambulatory Visit: Payer: Medicare Other | Admitting: Neurology

## 2017-10-24 DIAGNOSIS — M25552 Pain in left hip: Secondary | ICD-10-CM | POA: Diagnosis not present

## 2017-10-24 DIAGNOSIS — M2550 Pain in unspecified joint: Secondary | ICD-10-CM | POA: Diagnosis not present

## 2017-10-24 DIAGNOSIS — Z6828 Body mass index (BMI) 28.0-28.9, adult: Secondary | ICD-10-CM | POA: Diagnosis not present

## 2017-10-24 DIAGNOSIS — R768 Other specified abnormal immunological findings in serum: Secondary | ICD-10-CM | POA: Diagnosis not present

## 2017-10-24 DIAGNOSIS — E663 Overweight: Secondary | ICD-10-CM | POA: Diagnosis not present

## 2017-10-27 ENCOUNTER — Ambulatory Visit (INDEPENDENT_AMBULATORY_CARE_PROVIDER_SITE_OTHER): Payer: Medicare Other | Admitting: Neurology

## 2017-10-27 DIAGNOSIS — M79605 Pain in left leg: Secondary | ICD-10-CM

## 2017-10-27 NOTE — Procedures (Signed)
[facility]  [address]  [place], [place] [address] Tel: [phone] Fax:  [phone] Test Date:  10/27/2017  Patient: [name] DOB: Jun 03, 1951 Physician: Narda Amber, DO  Sex: Female Height: 5' 7"  Ref Phys: Hulan Saas, DO  ID#: [id number] Temp: 32.0C Technician:    Patient Complaints: This is a 66 year old female referred for evaluation of left hip pain.  NCV & EMG Findings: Extensive electrodiagnostic testing of the left lower extremity shows:  1. Left sural and superficial peroneal sensory responses are within normal limits. 2. Left peroneal and tibial motor responses are within normal limits. 3. Left tibial H reflex study is within normal limits. 4. There is no evidence of active or chronic motor axon loss changes affecting any of the tested muscles. Motor unit configuration and recruitment pattern is within normal limits.  Impression: This is a normal study of the left lower extremity. In particular, there is no evidence of a lumbosacral radiculopathy, myopathy, or sensorimotor polyneuropathy.   ___________________________ Narda Amber, DO    Nerve Conduction Studies Anti Sensory Summary Table   Site NR Peak (ms) Norm Peak (ms) P-T Amp (V) Norm P-T Amp  Left Sup Peroneal Anti Sensory (Ant Lat Mall)  12 cm    4.0 <4.6 11.6 >3  Left Sural Anti Sensory (Lat Mall)  Calf    3.5 <4.6 24.2 >3   Motor Summary Table   Site NR Onset (ms) Norm Onset (ms) O-P Amp (mV) Norm O-P Amp Site1 Site2 Delta-0 (ms) Dist (cm) Vel (m/s) Norm Vel (m/s)  Left Peroneal Motor (Ext Dig Brev)  Ankle    6.0 <6.0 4.3 >2.5 B Fib Ankle 9.0 39.0 43 >40  B Fib    15.0  4.0  Poplt B Fib 1.7 8.0 47 >40  Poplt    16.7  3.7         Left Tibial Motor (Abd Hall Brev)  Ankle    4.7 <6.0 11.5 >4 Knee Ankle 9.9 42.0 42 >40  Knee    14.6  7.2          H Reflex Studies   NR H-Lat (ms) Lat Norm (ms) L-R H-Lat (ms)  Left Tibial (Gastroc)     32.52 <35    EMG   Side  Muscle Ins Act Fibs Psw Fasc Number Recrt Dur Dur. Amp Amp. Poly Poly. Comment  Left AntTibialis Nml Nml Nml Nml Nml Nml Nml Nml Nml Nml Nml Nml N/A  Left Gastroc Nml Nml Nml Nml Nml Nml Nml Nml Nml Nml Nml Nml N/A  Left RectFemoris Nml Nml Nml Nml Nml Nml Nml Nml Nml Nml Nml Nml N/A  Left GluteusMed Nml Nml Nml Nml Nml Nml Nml Nml Nml Nml Nml Nml N/A  Left BicepsFemS Nml Nml Nml Nml Nml Nml Nml Nml Nml Nml Nml Nml N/A      Waveforms:

## 2017-10-31 ENCOUNTER — Encounter: Payer: Medicare Other | Admitting: Gynecology

## 2017-11-03 ENCOUNTER — Encounter: Payer: Medicare Other | Admitting: Gynecology
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
## 2017-11-09 ENCOUNTER — Ambulatory Visit: Payer: Medicare Other | Admitting: Family Medicine

## 2017-11-11 ENCOUNTER — Ambulatory Visit (INDEPENDENT_AMBULATORY_CARE_PROVIDER_SITE_OTHER): Payer: Medicare Other | Admitting: Family Medicine

## 2017-11-11 ENCOUNTER — Encounter: Payer: Self-pay | Admitting: Family Medicine

## 2017-11-11 VITALS — BP 118/78 | HR 86 | Ht 67.0 in | Wt 168.0 lb

## 2017-11-11 DIAGNOSIS — E038 Other specified hypothyroidism: Secondary | ICD-10-CM | POA: Diagnosis not present

## 2017-11-11 DIAGNOSIS — M2550 Pain in unspecified joint: Secondary | ICD-10-CM | POA: Diagnosis not present

## 2017-11-11 DIAGNOSIS — M791 Myalgia, unspecified site: Secondary | ICD-10-CM | POA: Diagnosis not present

## 2017-11-11 DIAGNOSIS — M51369 Other intervertebral disc degeneration, lumbar region without mention of lumbar back pain or lower extremity pain: Secondary | ICD-10-CM

## 2017-11-11 DIAGNOSIS — M5136 Other intervertebral disc degeneration, lumbar region: Secondary | ICD-10-CM

## 2017-11-11 DIAGNOSIS — E559 Vitamin D deficiency, unspecified: Secondary | ICD-10-CM | POA: Diagnosis not present

## 2017-11-11 MED ORDER — VITAMIN D (ERGOCALCIFEROL) 1.25 MG (50000 UNIT) PO CAPS: 50000.0000 [IU] | ORAL_CAPSULE | ORAL | 0 refills | Status: DC

## 2017-11-11 MED ORDER — LEVOTHYROXINE SODIUM 125 MCG PO TABS: 125.0000 ug | ORAL_TABLET | Freq: Every day | ORAL | 1 refills | Status: DC
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
## 2017-11-11 NOTE — Assessment & Plan Note (Signed)
Last TSH 5.51 and increase to 125 mcg

## 2017-11-11 NOTE — Assessment & Plan Note (Signed)
Started once weekly vitamin D
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
## 2017-11-14 ENCOUNTER — Encounter (HOSPITAL_COMMUNITY): Payer: Self-pay | Admitting: Emergency Medicine

## 2017-11-14 ENCOUNTER — Emergency Department (HOSPITAL_COMMUNITY): Payer: No Typology Code available for payment source

## 2017-11-14 ENCOUNTER — Emergency Department (HOSPITAL_COMMUNITY)
Admission: EM | Admit: 2017-11-14 | Discharge: 2017-11-15 | Disposition: A | Discharge status: Home / Self Care | Payer: No Typology Code available for payment source | Attending: Emergency Medicine | Admitting: Emergency Medicine

## 2017-11-14 ENCOUNTER — Other Ambulatory Visit: Payer: Self-pay

## 2017-11-14 DIAGNOSIS — S39012A Strain of muscle, fascia and tendon of lower back, initial encounter: Secondary | ICD-10-CM | POA: Diagnosis not present

## 2017-11-14 DIAGNOSIS — M542 Cervicalgia: Secondary | ICD-10-CM | POA: Diagnosis not present

## 2017-11-14 DIAGNOSIS — E039 Hypothyroidism, unspecified: Secondary | ICD-10-CM | POA: Insufficient documentation

## 2017-11-14 DIAGNOSIS — Z79899 Other long term (current) drug therapy: Secondary | ICD-10-CM | POA: Diagnosis not present

## 2017-11-14 DIAGNOSIS — S199XXA Unspecified injury of neck, initial encounter: Secondary | ICD-10-CM | POA: Diagnosis not present

## 2017-11-14 DIAGNOSIS — Y939 Activity, unspecified: Secondary | ICD-10-CM | POA: Diagnosis not present

## 2017-11-14 DIAGNOSIS — T148XXA Other injury of unspecified body region, initial encounter: Secondary | ICD-10-CM | POA: Diagnosis not present

## 2017-11-14 DIAGNOSIS — S0990XA Unspecified injury of head, initial encounter: Secondary | ICD-10-CM | POA: Diagnosis not present

## 2017-11-14 DIAGNOSIS — Y92410 Unspecified street and highway as the place of occurrence of the external cause: Secondary | ICD-10-CM | POA: Insufficient documentation

## 2017-11-14 DIAGNOSIS — Y999 Unspecified external cause status: Secondary | ICD-10-CM | POA: Diagnosis not present

## 2017-11-14 DIAGNOSIS — I10 Essential (primary) hypertension: Secondary | ICD-10-CM | POA: Insufficient documentation

## 2017-11-14 DIAGNOSIS — R51 Headache: Secondary | ICD-10-CM | POA: Diagnosis not present

## 2017-11-14 MED ORDER — ACETAMINOPHEN 325 MG PO TABS
650.0000 mg | ORAL_TABLET | Freq: Once | ORAL | Status: AC
  Administered 2017-11-14: 650 mg via ORAL
  Filled 2017-11-14: qty 2

## 2017-11-14 MED ORDER — LIDOCAINE 5 % EX PTCH: 1.0000 | MEDICATED_PATCH | CUTANEOUS | 0 refills | Status: DC

## 2017-11-14 MED ORDER — ACETAMINOPHEN 325 MG PO TABS: 650.0000 mg | ORAL_TABLET | Freq: Four times a day (QID) | ORAL | 0 refills | Status: DC | PRN
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
## 2017-11-14 NOTE — ED Notes (Signed)
Patient transported to CT 
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
## 2017-11-14 NOTE — Discharge Instructions (Signed)
Take tylenol 3 times a day with meals.   Use lidoderm patches as needed for pain. Use ice packs or heating pads if this helps control your pain. You likely have continued muscle stiffness and soreness over the next couple days.  Follow-up with Dr. [name] in 1 week if your symptoms are not improving. Return to the emergency room if you develop vision changes, vomiting, slurred speech, numbness, loss of bowel or bladder control, or any new or worsening symptoms.

## 2017-11-14 NOTE — ED Notes (Signed)
In the middle of triaging pt. in the PEDs ED, family came in & pt. & granddaughter welcomed them. This RN re-directed pt. to continue triage & had pt's verbal permission to continue triage.

## 2017-11-15 ENCOUNTER — Telehealth: Payer: Self-pay

## 2017-11-15 NOTE — Telephone Encounter (Signed)
Pt needs an appt with Dr. [name]   Copied from [place]. Topic: General - Other >> Nov 15, 2017 10:42 AM [name] wrote: Reason for CRM: Patient would not elaborate on the reason for the call, but she would like for Dr. Jenny Reichmann nurse to call her asap @ [phone].

## 2017-11-15 NOTE — ED Notes (Signed)
Pt departed in NAD, refused use of wheelchair.  

## 2017-11-19 ENCOUNTER — Encounter (HOSPITAL_COMMUNITY): Payer: Self-pay | Admitting: Emergency Medicine

## 2017-11-19 ENCOUNTER — Emergency Department (HOSPITAL_COMMUNITY): Payer: No Typology Code available for payment source

## 2017-11-19 ENCOUNTER — Emergency Department (HOSPITAL_COMMUNITY)
Admission: EM | Admit: 2017-11-19 | Discharge: 2017-11-19 | Disposition: A | Discharge status: Home / Self Care | Payer: No Typology Code available for payment source | Attending: Emergency Medicine | Admitting: Emergency Medicine

## 2017-11-19 DIAGNOSIS — R51 Headache: Secondary | ICD-10-CM | POA: Diagnosis not present

## 2017-11-19 DIAGNOSIS — M25551 Pain in right hip: Secondary | ICD-10-CM | POA: Diagnosis not present

## 2017-11-19 DIAGNOSIS — Z79899 Other long term (current) drug therapy: Secondary | ICD-10-CM | POA: Diagnosis not present

## 2017-11-19 DIAGNOSIS — R519 Headache, unspecified: Secondary | ICD-10-CM

## 2017-11-19 DIAGNOSIS — E039 Hypothyroidism, unspecified: Secondary | ICD-10-CM | POA: Insufficient documentation

## 2017-11-19 DIAGNOSIS — M545 Low back pain, unspecified: Secondary | ICD-10-CM

## 2017-11-19 DIAGNOSIS — S3992XA Unspecified injury of lower back, initial encounter: Secondary | ICD-10-CM | POA: Diagnosis not present

## 2017-11-19 DIAGNOSIS — M542 Cervicalgia: Secondary | ICD-10-CM | POA: Diagnosis not present

## 2017-11-19 DIAGNOSIS — S0990XA Unspecified injury of head, initial encounter: Secondary | ICD-10-CM | POA: Diagnosis not present

## 2017-11-19 DIAGNOSIS — I10 Essential (primary) hypertension: Secondary | ICD-10-CM | POA: Diagnosis not present

## 2017-11-19 DIAGNOSIS — S199XXA Unspecified injury of neck, initial encounter: Secondary | ICD-10-CM | POA: Diagnosis not present

## 2017-11-19 HISTORY — DX: Pain in left leg: M79.605

## 2017-11-19 MED ORDER — METHOCARBAMOL 500 MG PO TABS: 1000.0000 mg | ORAL_TABLET | Freq: Four times a day (QID) | ORAL | 0 refills | Status: DC | PRN

## 2017-11-19 MED ORDER — TRAMADOL HCL 50 MG PO TABS: 50.0000 mg | ORAL_TABLET | Freq: Four times a day (QID) | ORAL | 0 refills | Status: DC | PRN
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
## 2017-11-19 NOTE — Discharge Instructions (Signed)
Take the prescriptions as directed.  Apply moist heat or ice to the area(s) of discomfort, for 15 minutes at a time, several times per day for the next few days.  Do not fall asleep on a heating or ice pack.  Call your regular medical doctor on Monday to schedule a follow up appointment this week.  Return to the Emergency Department immediately if worsening.
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
## 2017-12-01 ENCOUNTER — Encounter: Payer: Self-pay | Admitting: Internal Medicine

## 2017-12-01 ENCOUNTER — Ambulatory Visit (INDEPENDENT_AMBULATORY_CARE_PROVIDER_SITE_OTHER): Payer: Medicare Other | Admitting: Internal Medicine

## 2017-12-01 VITALS — BP 132/80 | HR 84 | Temp 98.7°F | Resp 16 | Ht 67.0 in | Wt 184.5 lb

## 2017-12-01 DIAGNOSIS — R51 Headache: Secondary | ICD-10-CM

## 2017-12-01 DIAGNOSIS — Z23 Encounter for immunization: Secondary | ICD-10-CM | POA: Diagnosis not present

## 2017-12-01 DIAGNOSIS — M5136 Other intervertebral disc degeneration, lumbar region: Secondary | ICD-10-CM

## 2017-12-01 DIAGNOSIS — R519 Headache, unspecified: Secondary | ICD-10-CM

## 2017-12-01 MED ORDER — MELOXICAM 15 MG PO TABS: 15.0000 mg | ORAL_TABLET | Freq: Every day | ORAL | 0 refills | Status: DC

## 2017-12-01 NOTE — Progress Notes (Signed)
done

## 2017-12-01 NOTE — Patient Instructions (Signed)
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
## 2017-12-06 DIAGNOSIS — M542 Cervicalgia: Secondary | ICD-10-CM | POA: Diagnosis not present

## 2017-12-17 ENCOUNTER — Other Ambulatory Visit: Payer: Self-pay | Admitting: Internal Medicine

## 2017-12-17 DIAGNOSIS — E038 Other specified hypothyroidism: Secondary | ICD-10-CM

## 2017-12-18 ENCOUNTER — Other Ambulatory Visit: Payer: Self-pay | Admitting: Internal Medicine

## 2017-12-28 ENCOUNTER — Other Ambulatory Visit: Payer: Self-pay | Admitting: Internal Medicine

## 2017-12-28 DIAGNOSIS — M5136 Other intervertebral disc degeneration, lumbar region: Secondary | ICD-10-CM

## 2018-01-11 ENCOUNTER — Encounter: Payer: Self-pay | Admitting: Internal Medicine

## 2018-01-11 ENCOUNTER — Ambulatory Visit (INDEPENDENT_AMBULATORY_CARE_PROVIDER_SITE_OTHER): Payer: Medicare Other | Admitting: Internal Medicine

## 2018-01-11 VITALS — BP 122/78 | HR 81 | Temp 97.5°F | Resp 16 | Ht 67.0 in | Wt 184.0 lb

## 2018-01-11 DIAGNOSIS — I10 Essential (primary) hypertension: Secondary | ICD-10-CM | POA: Diagnosis not present

## 2018-01-11 DIAGNOSIS — J988 Other specified respiratory disorders: Secondary | ICD-10-CM

## 2018-01-11 DIAGNOSIS — E039 Hypothyroidism, unspecified: Secondary | ICD-10-CM | POA: Diagnosis not present

## 2018-01-11 DIAGNOSIS — K5901 Slow transit constipation: Secondary | ICD-10-CM | POA: Diagnosis not present

## 2018-01-11 MED ORDER — RESTORA PO CAPS: 1.0000 | ORAL_CAPSULE | Freq: Every day | ORAL | 1 refills | Status: DC

## 2018-01-11 MED ORDER — PROMETHAZINE-DM 6.25-15 MG/5ML PO SYRP: 5.0000 mL | ORAL_SOLUTION | Freq: Four times a day (QID) | ORAL | 0 refills | Status: AC | PRN

## 2018-01-11 MED ORDER — CEFDINIR 300 MG PO CAPS: 300.0000 mg | ORAL_CAPSULE | Freq: Two times a day (BID) | ORAL | 0 refills | Status: AC

## 2018-01-11 MED ORDER — LEVOTHYROXINE SODIUM 125 MCG PO TABS: 125.0000 ug | ORAL_TABLET | Freq: Every day | ORAL | 0 refills | Status: DC
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
## 2018-01-11 NOTE — Patient Instructions (Signed)
Hypothyroidism Hypothyroidism is a disorder of the thyroid. The thyroid is a large gland that is located in the lower front of the neck. The thyroid releases hormones that control how the body works. With hypothyroidism, the thyroid does not make enough of these hormones. What are the causes? Causes of hypothyroidism may include:  Viral infections.  Pregnancy.  Your own defense system (immune system) attacking your thyroid.  Certain medicines.  Birth defects.  Past radiation treatments to your head or neck.  Past treatment with radioactive iodine.  Past surgical removal of part or all of your thyroid.  Problems with the gland that is located in the center of your brain (pituitary).  What are the signs or symptoms? Signs and symptoms of hypothyroidism may include:  Feeling as though you have no energy (lethargy).  Inability to tolerate cold.  Weight gain that is not explained by a change in diet or exercise habits.  Dry skin.  Coarse hair.  Menstrual irregularity.  Slowing of thought processes.  Constipation.  Sadness or depression.  How is this diagnosed? Your health care provider may diagnose hypothyroidism with blood tests and ultrasound tests. How is this treated? Hypothyroidism is treated with medicine that replaces the hormones that your body does not make. After you begin treatment, it may take several weeks for symptoms to go away. Follow these instructions at home:  Take medicines only as directed by your health care provider.  If you start taking any new medicines, tell your health care provider.  Keep all follow-up visits as directed by your health care provider. This is important. As your condition improves, your dosage needs may change. You will need to have blood tests regularly so that your health care provider can watch your condition. Contact a health care provider if:  Your symptoms do not get better with treatment.  You are taking thyroid  replacement medicine and: ? You sweat excessively. ? You have tremors. ? You feel anxious. ? You lose weight rapidly. ? You cannot tolerate heat. ? You have emotional swings. ? You have diarrhea. ? You feel weak. Get help right away if:  You develop chest pain.  You develop an irregular heartbeat.  You develop a rapid heartbeat. This information is not intended to replace advice given to you by your health care provider. Make sure you discuss any questions you have with your health care provider. Document Released: 11/15/2005 Document Revised: 04/22/2016 Document Reviewed: 04/02/2014 Elsevier Interactive Patient Education  2018 Elsevier Inc.  

## 2018-01-17 DIAGNOSIS — M542 Cervicalgia: Secondary | ICD-10-CM | POA: Diagnosis not present

## 2018-01-24 DIAGNOSIS — H2513 Age-related nuclear cataract, bilateral: Secondary | ICD-10-CM | POA: Diagnosis not present

## 2018-01-31 DIAGNOSIS — M47812 Spondylosis without myelopathy or radiculopathy, cervical region: Secondary | ICD-10-CM | POA: Diagnosis not present

## 2018-01-31 DIAGNOSIS — I10 Essential (primary) hypertension: Secondary | ICD-10-CM | POA: Diagnosis not present

## 2018-01-31 DIAGNOSIS — M542 Cervicalgia: Secondary | ICD-10-CM | POA: Diagnosis not present

## 2018-01-31 DIAGNOSIS — M961 Postlaminectomy syndrome, not elsewhere classified: Secondary | ICD-10-CM | POA: Diagnosis not present

## 2018-02-01 ENCOUNTER — Other Ambulatory Visit: Payer: Self-pay | Admitting: Family Medicine

## 2018-02-01 DIAGNOSIS — I10 Essential (primary) hypertension: Secondary | ICD-10-CM | POA: Diagnosis not present

## 2018-02-01 DIAGNOSIS — M47812 Spondylosis without myelopathy or radiculopathy, cervical region: Secondary | ICD-10-CM | POA: Diagnosis not present

## 2018-02-09 NOTE — Progress Notes (Signed)
[name] [address] [place], [place] [address] Phone: [phone] Subjective:     CC: Leg pain, polyarthralgia follow-up  GHW:[id number]  [name] is a 67 y.o. female coming in with complaint of polyarthralgia. She has been using vitamin D and wants to know if she should continue taking that medication. Patient was in physical therapy for her neck.   Patient states that seems to be doing relatively well overall.  Feels like she is back to her baseline.  Still has some weakness from time to time but nothing severe.     Past Medical History:  Diagnosis Date  . Anemia   . Arthritis   . Cholelithiasis   . Chronic back pain   . Constipation   . Esophageal reflux   . Fibromyalgia   . Hyperlipidemia   . Hypertension   . Hypothyroid   . IBS (irritable bowel syndrome)   . Internal hemorrhoids without mention of complication   . Left leg pain   . Personal history of colonic polyps 05/06/2010   ADENOMATOUS POLYP   Past Surgical History:  Procedure Laterality Date  . ABDOMINAL HYSTERECTOMY    . CERVICAL DISC SURGERY     PLATE IN NECK & BACK  . CHOLECYSTECTOMY    . COLONOSCOPY  09/27/2011   NORMAL   . LUMBAR DISC SURGERY    . TONSILLECTOMY     Social History   Socioeconomic History  . Marital status: Married    Spouse name: None  . Number of children: 2  . Years of education: None  . Highest education level: None  Social Needs  . Financial resource strain: None  . Food insecurity - worry: None  . Food insecurity - inability: None  . Transportation needs - medical: None  . Transportation needs - non-medical: None  Occupational History  . Occupation: housewife    Employer: UNEMPLOYED   Tobacco Use  . Smoking status: Never Smoker  . Smokeless tobacco: Never Used  Substance and Sexual Activity  . Alcohol use: No    Alcohol/week: 0.0 oz  . Drug use: No  . Sexual activity: Not Currently  Other Topics Concern  . None  Social  History Narrative   Daily Caffeine Use   Allergies  Allergen Reactions  . Livalo [Pitavastatin]     myalgias  . Aspirin   . Gadolinium      Code: HIVES, Desc: patient unsure of which kind of dye she had a reaction to until reaction to Magnevist - may be allergic to x-ray contrast, Onset Date: [id number]   . Hydrocodone-Acetaminophen   . Iohexol   . Lipitor [Atorvastatin]     REACTION: Myalgias  . Crestor [Rosuvastatin Calcium]     rash   Family History  Problem Relation Age of Onset  . Diabetes Mother        aunts and uncles  . Kidney disease Mother        stage 3  . Lung disease Mother        colapsed lung/in hospice  . COPD Mother   . Hypertension Mother   . Dementia Mother   . Breast cancer Maternal Aunt   . Heart failure Father        Mother  . Breast cancer Cousin   . Colon cancer Neg Hx      Past medical history, social, surgical and family history all reviewed in electronic medical record.  No pertanent information unless stated regarding  to the chief complaint.   Review of Systems:Review of systems updated and as accurate as of 02/10/18  No headache, visual changes, nausea, vomiting, diarrhea, constipation, dizziness, abdominal pain, skin rash, fevers, chills, night sweats, weight loss, swollen lymph nodes, chest pain, shortness of breath, mood changes.  Positive muscle aches, body aches  Objective  Blood pressure 120/70, pulse 89, height 5' 7"  (1.702 m), weight 188 lb (85.3 kg), SpO2 (!) 88 %. Systems examined below as of 02/10/18   General: No apparent distress alert and oriented x3 mood and affect normal, dressed appropriately.  HEENT: Pupils equal, extraocular movements intact  Respiratory: Patient's speak in full sentences and does not appear short of breath  Cardiovascular: No lower extremity edema, non tender, no erythema  Skin: Warm dry intact with no signs of infection or rash on extremities or on axial skeleton.  Abdomen: Soft nontender  Neuro:  Cranial nerves II through XII are intact, neurovascularly intact in all extremities with 2+ DTRs and 2+ pulses.  Lymph: No lymphadenopathy of posterior or anterior cervical chain or axillae bilaterally.  Gait normal with good balance and coordination.  MSK: Mild to moderate tender with mild limited range of motion and good stability and symmetric strength and tone of shoulders, elbows, wrist, hip, knee and ankles bilaterally.     Impression and Recommendations:     This case required medical decision making of moderate complexity.      Note: This dictation was prepared with Dragon dictation along with smaller phrase technology. Any transcriptional errors that result from this process are unintentional.

## 2018-02-10 ENCOUNTER — Ambulatory Visit (INDEPENDENT_AMBULATORY_CARE_PROVIDER_SITE_OTHER): Payer: Medicare Other | Admitting: Family Medicine

## 2018-02-10 ENCOUNTER — Other Ambulatory Visit (INDEPENDENT_AMBULATORY_CARE_PROVIDER_SITE_OTHER): Payer: Medicare Other

## 2018-02-10 ENCOUNTER — Encounter: Payer: Self-pay | Admitting: Family Medicine

## 2018-02-10 ENCOUNTER — Other Ambulatory Visit: Payer: Medicare Other

## 2018-02-10 DIAGNOSIS — E559 Vitamin D deficiency, unspecified: Secondary | ICD-10-CM

## 2018-02-10 DIAGNOSIS — E039 Hypothyroidism, unspecified: Secondary | ICD-10-CM | POA: Diagnosis not present

## 2018-02-10 DIAGNOSIS — M791 Myalgia, unspecified site: Secondary | ICD-10-CM

## 2018-02-10 LAB — TSH: TSH: 1.58 u[IU]/mL (ref 0.35–4.50)

## 2018-02-10 LAB — VITAMIN D 25 HYDROXY (VIT D DEFICIENCY, FRACTURES): VITD: 23.04 ng/mL — AB (ref 30.00–100.00)

## 2018-02-10 NOTE — Assessment & Plan Note (Signed)
Patient seems to be doing relatively well overall.  Patient's needed to repeat the vitamin D and the thyroid testing.  Will make sure patient is on the right dosing.  Patient has had difficulty keeping the vitamin D and may need to include daily supplementation and include the once weekly as well.  Will discuss when labs come back.  Continue to stay active though otherwise.

## 2018-02-10 NOTE — Assessment & Plan Note (Signed)
Recheck value.

## 2018-02-10 NOTE — Assessment & Plan Note (Signed)
Recheck of thyroid disease again.  Increased dose in December.

## 2018-02-10 NOTE — Patient Instructions (Signed)
Great to see you  Lets get labs and see where the vitamin D is.  I think you are doing great  If you need [place] really bad then call and ask for lindsay  CoQ10 200-468m daily can help with headaches See me again in 3 months

## 2018-03-02 DIAGNOSIS — M542 Cervicalgia: Secondary | ICD-10-CM | POA: Diagnosis not present

## 2018-04-11 ENCOUNTER — Ambulatory Visit: Payer: Medicare Other | Admitting: Internal Medicine

## 2018-04-25 DIAGNOSIS — R768 Other specified abnormal immunological findings in serum: Secondary | ICD-10-CM | POA: Diagnosis not present

## 2018-04-25 DIAGNOSIS — M2550 Pain in unspecified joint: Secondary | ICD-10-CM | POA: Diagnosis not present

## 2018-04-25 DIAGNOSIS — Z6829 Body mass index (BMI) 29.0-29.9, adult: Secondary | ICD-10-CM | POA: Diagnosis not present

## 2018-04-25 DIAGNOSIS — E663 Overweight: Secondary | ICD-10-CM | POA: Diagnosis not present

## 2018-04-28 ENCOUNTER — Other Ambulatory Visit: Payer: Self-pay | Admitting: Family Medicine

## 2018-05-08 ENCOUNTER — Inpatient Hospital Stay (HOSPITAL_COMMUNITY)
Admission: EM | Disposition: A | Discharge status: Home / Self Care | Payer: Self-pay | Source: Home / Self Care | Attending: Interventional Cardiology

## 2018-05-08 ENCOUNTER — Encounter (HOSPITAL_COMMUNITY): Payer: Self-pay

## 2018-05-08 ENCOUNTER — Other Ambulatory Visit (INDEPENDENT_AMBULATORY_CARE_PROVIDER_SITE_OTHER): Payer: Medicare Other

## 2018-05-08 ENCOUNTER — Ambulatory Visit (INDEPENDENT_AMBULATORY_CARE_PROVIDER_SITE_OTHER)
Admission: RE | Admit: 2018-05-08 | Discharge: 2018-05-08 | Disposition: A | Discharge status: Home / Self Care | Payer: Medicare Other | Source: Ambulatory Visit | Attending: Internal Medicine | Admitting: Internal Medicine

## 2018-05-08 ENCOUNTER — Other Ambulatory Visit: Payer: Self-pay

## 2018-05-08 ENCOUNTER — Ambulatory Visit (INDEPENDENT_AMBULATORY_CARE_PROVIDER_SITE_OTHER): Payer: Medicare Other | Admitting: Internal Medicine

## 2018-05-08 ENCOUNTER — Ambulatory Visit (INDEPENDENT_AMBULATORY_CARE_PROVIDER_SITE_OTHER): Payer: Medicare Other | Admitting: *Deleted

## 2018-05-08 ENCOUNTER — Inpatient Hospital Stay (HOSPITAL_COMMUNITY)
Admission: EM | Admit: 2018-05-08 | Discharge: 2018-05-11 | DRG: 286 | Disposition: A | Discharge status: Home / Self Care | Payer: Medicare Other | Source: Ambulatory Visit | Attending: Interventional Cardiology | Admitting: Interventional Cardiology

## 2018-05-08 ENCOUNTER — Encounter: Payer: Self-pay | Admitting: Internal Medicine

## 2018-05-08 ENCOUNTER — Emergency Department (HOSPITAL_COMMUNITY): Payer: Medicare Other

## 2018-05-08 VITALS — BP 132/86 | HR 83 | Temp 98.4°F | Resp 17 | Ht 67.0 in | Wt 186.0 lb

## 2018-05-08 VITALS — BP 132/86 | HR 83 | Temp 98.4°F | Resp 16 | Ht 67.0 in | Wt 186.0 lb

## 2018-05-08 DIAGNOSIS — I10 Essential (primary) hypertension: Secondary | ICD-10-CM

## 2018-05-08 DIAGNOSIS — Z886 Allergy status to analgesic agent status: Secondary | ICD-10-CM

## 2018-05-08 DIAGNOSIS — I5181 Takotsubo syndrome: Secondary | ICD-10-CM | POA: Diagnosis not present

## 2018-05-08 DIAGNOSIS — R000 Tachycardia, unspecified: Secondary | ICD-10-CM | POA: Diagnosis not present

## 2018-05-08 DIAGNOSIS — Z0000 Encounter for general adult medical examination without abnormal findings: Secondary | ICD-10-CM

## 2018-05-08 DIAGNOSIS — E785 Hyperlipidemia, unspecified: Secondary | ICD-10-CM | POA: Diagnosis present

## 2018-05-08 DIAGNOSIS — E781 Pure hyperglyceridemia: Secondary | ICD-10-CM

## 2018-05-08 DIAGNOSIS — E039 Hypothyroidism, unspecified: Secondary | ICD-10-CM | POA: Diagnosis not present

## 2018-05-08 DIAGNOSIS — M549 Dorsalgia, unspecified: Secondary | ICD-10-CM | POA: Diagnosis present

## 2018-05-08 DIAGNOSIS — G8929 Other chronic pain: Secondary | ICD-10-CM | POA: Diagnosis present

## 2018-05-08 DIAGNOSIS — R7303 Prediabetes: Secondary | ICD-10-CM | POA: Diagnosis not present

## 2018-05-08 DIAGNOSIS — E876 Hypokalemia: Secondary | ICD-10-CM

## 2018-05-08 DIAGNOSIS — I110 Hypertensive heart disease with heart failure: Secondary | ICD-10-CM | POA: Diagnosis not present

## 2018-05-08 DIAGNOSIS — R079 Chest pain, unspecified: Secondary | ICD-10-CM | POA: Diagnosis not present

## 2018-05-08 DIAGNOSIS — Z888 Allergy status to other drugs, medicaments and biological substances status: Secondary | ICD-10-CM | POA: Diagnosis not present

## 2018-05-08 DIAGNOSIS — R0902 Hypoxemia: Secondary | ICD-10-CM | POA: Diagnosis not present

## 2018-05-08 DIAGNOSIS — M797 Fibromyalgia: Secondary | ICD-10-CM | POA: Diagnosis present

## 2018-05-08 DIAGNOSIS — M179 Osteoarthritis of knee, unspecified: Secondary | ICD-10-CM | POA: Diagnosis present

## 2018-05-08 DIAGNOSIS — I213 ST elevation (STEMI) myocardial infarction of unspecified site: Secondary | ICD-10-CM | POA: Diagnosis not present

## 2018-05-08 DIAGNOSIS — Z885 Allergy status to narcotic agent status: Secondary | ICD-10-CM

## 2018-05-08 DIAGNOSIS — Z79899 Other long term (current) drug therapy: Secondary | ICD-10-CM | POA: Diagnosis not present

## 2018-05-08 DIAGNOSIS — I5041 Acute combined systolic (congestive) and diastolic (congestive) heart failure: Secondary | ICD-10-CM | POA: Diagnosis present

## 2018-05-08 DIAGNOSIS — K219 Gastro-esophageal reflux disease without esophagitis: Secondary | ICD-10-CM | POA: Diagnosis present

## 2018-05-08 DIAGNOSIS — R0789 Other chest pain: Secondary | ICD-10-CM | POA: Diagnosis not present

## 2018-05-08 DIAGNOSIS — Z8249 Family history of ischemic heart disease and other diseases of the circulatory system: Secondary | ICD-10-CM | POA: Diagnosis not present

## 2018-05-08 DIAGNOSIS — Z79890 Hormone replacement therapy (postmenopausal): Secondary | ICD-10-CM | POA: Diagnosis not present

## 2018-05-08 DIAGNOSIS — R112 Nausea with vomiting, unspecified: Secondary | ICD-10-CM

## 2018-05-08 DIAGNOSIS — Z90710 Acquired absence of both cervix and uterus: Secondary | ICD-10-CM | POA: Diagnosis not present

## 2018-05-08 DIAGNOSIS — M1710 Unilateral primary osteoarthritis, unspecified knee: Secondary | ICD-10-CM | POA: Diagnosis not present

## 2018-05-08 DIAGNOSIS — J069 Acute upper respiratory infection, unspecified: Secondary | ICD-10-CM | POA: Diagnosis not present

## 2018-05-08 DIAGNOSIS — I249 Acute ischemic heart disease, unspecified: Secondary | ICD-10-CM | POA: Diagnosis not present

## 2018-05-08 DIAGNOSIS — Z86010 Personal history of colonic polyps: Secondary | ICD-10-CM | POA: Diagnosis not present

## 2018-05-08 HISTORY — PX: LEFT HEART CATH AND CORONARY ANGIOGRAPHY: CATH118249

## 2018-05-08 LAB — LIPID PANEL
Cholesterol: 184 mg/dL (ref 0–200)
Cholesterol: 200 mg/dL (ref 0–200)
HDL: 43 mg/dL (ref >39.00)
HDL: 52 mg/dL (ref >40)
LDL CALC: 118 mg/dL — AB (ref 0–99)
LDL Cholesterol: 110 mg/dL — ABNORMAL HIGH (ref 0–99)
NonHDL: 141.4
TRIGLYCERIDES: 158 mg/dL — AB (ref 0.0–149.0)
Total CHOL/HDL Ratio: 4
Total CHOL/HDL Ratio: 3.8 RATIO
Triglycerides: 148 mg/dL (ref <150)
VLDL: 31.6 mg/dL (ref 0.0–40.0)
VLDL: 30 mg/dL (ref 0–40)

## 2018-05-08 LAB — BASIC METABOLIC PANEL
BUN: 7 mg/dL (ref 6–23)
CALCIUM: 9.8 mg/dL (ref 8.4–10.5)
CHLORIDE: 98 meq/L (ref 96–112)
CO2: 31 meq/L (ref 19–32)
CREATININE: 0.77 mg/dL (ref 0.40–1.20)
GFR: 96.17 mL/min (ref >60.00)
GLUCOSE: 132 mg/dL — AB (ref 70–99)
Potassium: 3 mEq/L — ABNORMAL LOW (ref 3.5–5.1)
Sodium: 141 mEq/L (ref 135–145)

## 2018-05-08 LAB — APTT: aPTT: 31 seconds (ref 24–36)

## 2018-05-08 LAB — CBC WITH DIFFERENTIAL/PLATELET
Abs Immature Granulocytes: 0.1 10*3/uL (ref 0.0–0.1)
Basophils Absolute: 0 10*3/uL (ref 0.0–0.1)
Basophils Relative: 0 %
EOS ABS: 0 10*3/uL (ref 0.0–0.7)
EOS PCT: 0 %
HCT: 40.8 % (ref 36.0–46.0)
Hemoglobin: 14 g/dL (ref 12.0–15.0)
Immature Granulocytes: 1 %
LYMPHS ABS: 3.3 10*3/uL (ref 0.7–4.0)
Lymphocytes Relative: 25 %
MCH: 27 pg (ref 26.0–34.0)
MCHC: 34.3 g/dL (ref 30.0–36.0)
MCV: 78.8 fL (ref 78.0–100.0)
MONO ABS: 0.7 10*3/uL (ref 0.1–1.0)
Monocytes Relative: 6 %
Neutro Abs: 8.7 10*3/uL — ABNORMAL HIGH (ref 1.7–7.7)
Neutrophils Relative %: 68 %
Platelets: 350 10*3/uL (ref 150–400)
RBC: 5.18 MIL/uL — AB (ref 3.87–5.11)
RDW: 13.2 % (ref 11.5–15.5)
WBC: 12.9 10*3/uL — AB (ref 4.0–10.5)

## 2018-05-08 LAB — COMPREHENSIVE METABOLIC PANEL
ALBUMIN: 3.8 g/dL (ref 3.5–5.0)
ALK PHOS: 77 U/L (ref 38–126)
ALT: 10 U/L — AB (ref 14–54)
AST: 49 U/L — ABNORMAL HIGH (ref 15–41)
Anion gap: 14 (ref 5–15)
BILIRUBIN TOTAL: 3.2 mg/dL — AB (ref 0.3–1.2)
BUN: 6 mg/dL (ref 6–20)
CALCIUM: 9.2 mg/dL (ref 8.9–10.3)
CO2: 27 mmol/L (ref 22–32)
CREATININE: 0.89 mg/dL (ref 0.44–1.00)
Chloride: 99 mmol/L — ABNORMAL LOW (ref 101–111)
GFR calc Af Amer: >60 mL/min (ref >60)
GLUCOSE: 120 mg/dL — AB (ref 65–99)
Potassium: 4 mmol/L (ref 3.5–5.1)
Sodium: 140 mmol/L (ref 135–145)
Total Protein: 7.4 g/dL (ref 6.5–8.1)

## 2018-05-08 LAB — TROPONIN I
TROPONIN I: 1.29 ng/mL — AB (ref <0.03)
TROPONIN I: 0.92 ng/mL — AB (ref <0.03)

## 2018-05-08 LAB — TSH: TSH: 1.73 u[IU]/mL (ref 0.35–4.50)

## 2018-05-08 LAB — PROTIME-INR
INR: 1.01
Prothrombin Time: 13.2 seconds (ref 11.4–15.2)

## 2018-05-08 LAB — MRSA PCR SCREENING: MRSA by PCR: NEGATIVE

## 2018-05-08 LAB — LIPASE: Lipase: 14 U/L (ref 11.0–59.0)

## 2018-05-08 LAB — AMYLASE: Amylase: 57 U/L (ref 27–131)

## 2018-05-08 LAB — HEMOGLOBIN A1C: HEMOGLOBIN A1C: 6.1 % (ref 4.6–6.5)

## 2018-05-08 SURGERY — LEFT HEART CATH AND CORONARY ANGIOGRAPHY
Anesthesia: LOCAL

## 2018-05-08 MED ORDER — LIDOCAINE HCL (PF) 1 % IJ SOLN
INTRAMUSCULAR | Status: AC
  Filled 2018-05-08: qty 30

## 2018-05-08 MED ORDER — HYDRALAZINE HCL 20 MG/ML IJ SOLN: 5.0000 mg | INTRAMUSCULAR | Status: AC | PRN

## 2018-05-08 MED ORDER — NITROGLYCERIN 0.4 MG SL SUBL
0.4000 mg | SUBLINGUAL_TABLET | SUBLINGUAL | Status: DC | PRN
  Administered 2018-05-08: 0.4 mg via SUBLINGUAL

## 2018-05-08 MED ORDER — NITROGLYCERIN IN D5W 200-5 MCG/ML-% IV SOLN: 0.0000 ug/min | INTRAVENOUS | Status: DC

## 2018-05-08 MED ORDER — NITROGLYCERIN 1 MG/10 ML FOR IR/CATH LAB
INTRA_ARTERIAL | Status: DC | PRN
  Administered 2018-05-08: 200 ug via INTRACORONARY

## 2018-05-08 MED ORDER — SODIUM CHLORIDE 0.9 % IV SOLN
INTRAVENOUS | Status: DC
  Administered 2018-05-08: 20 mL/h via INTRAVENOUS
  Administered 2018-05-08: 12:00:00 via INTRAVENOUS

## 2018-05-08 MED ORDER — SODIUM CHLORIDE 0.9 % IV SOLN: 250.0000 mL | INTRAVENOUS | Status: DC | PRN

## 2018-05-08 MED ORDER — HEPARIN (PORCINE) IN NACL 1000-0.9 UT/500ML-% IV SOLN
INTRAVENOUS | Status: AC
  Filled 2018-05-08: qty 1000

## 2018-05-08 MED ORDER — VERAPAMIL HCL 2.5 MG/ML IV SOLN
INTRAVENOUS | Status: AC
  Filled 2018-05-08: qty 2

## 2018-05-08 MED ORDER — SODIUM CHLORIDE 0.9% FLUSH
3.0000 mL | Freq: Two times a day (BID) | INTRAVENOUS | Status: DC
  Administered 2018-05-09 – 2018-05-11 (×3): 3 mL via INTRAVENOUS

## 2018-05-08 MED ORDER — METHYLPREDNISOLONE SODIUM SUCC 125 MG IJ SOLR
INTRAMUSCULAR | Status: AC
  Filled 2018-05-08: qty 2

## 2018-05-08 MED ORDER — NITROGLYCERIN IN D5W 200-5 MCG/ML-% IV SOLN
INTRAVENOUS | Status: AC
  Filled 2018-05-08: qty 250

## 2018-05-08 MED ORDER — HEPARIN SODIUM (PORCINE) 1000 UNIT/ML IJ SOLN
INTRAMUSCULAR | Status: DC | PRN
  Administered 2018-05-08: 5000 [IU] via INTRAVENOUS

## 2018-05-08 MED ORDER — CLOPIDOGREL BISULFATE 300 MG PO TABS
ORAL_TABLET | ORAL | Status: DC | PRN
  Administered 2018-05-08: 600 mg via ORAL

## 2018-05-08 MED ORDER — CLOPIDOGREL BISULFATE 300 MG PO TABS
ORAL_TABLET | ORAL | Status: AC
  Filled 2018-05-08: qty 2

## 2018-05-08 MED ORDER — FENTANYL CITRATE (PF) 100 MCG/2ML IJ SOLN
INTRAMUSCULAR | Status: AC
  Filled 2018-05-08: qty 2

## 2018-05-08 MED ORDER — METOPROLOL SUCCINATE ER 25 MG PO TB24
25.0000 mg | ORAL_TABLET | Freq: Every day | ORAL | Status: DC
  Administered 2018-05-08 – 2018-05-11 (×4): 25 mg via ORAL
  Filled 2018-05-08 (×4): qty 1

## 2018-05-08 MED ORDER — MORPHINE SULFATE (PF) 2 MG/ML IV SOLN
2.0000 mg | Freq: Once | INTRAVENOUS | Status: AC
  Administered 2018-05-08: 2 mg via INTRAVENOUS
  Filled 2018-05-08: qty 1

## 2018-05-08 MED ORDER — DIPHENHYDRAMINE HCL 50 MG/ML IJ SOLN
INTRAMUSCULAR | Status: DC | PRN
  Administered 2018-05-08: 25 mg via INTRAVENOUS

## 2018-05-08 MED ORDER — POTASSIUM CHLORIDE CRYS ER 20 MEQ PO TBCR
40.0000 meq | EXTENDED_RELEASE_TABLET | Freq: Three times a day (TID) | ORAL | Status: AC
  Administered 2018-05-08 – 2018-05-09 (×3): 40 meq via ORAL
  Filled 2018-05-08 (×3): qty 2

## 2018-05-08 MED ORDER — NITROGLYCERIN IN D5W 200-5 MCG/ML-% IV SOLN
INTRAVENOUS | Status: AC | PRN
  Administered 2018-05-08: 20 ug/min via INTRAVENOUS

## 2018-05-08 MED ORDER — NITROGLYCERIN 1 MG/10 ML FOR IR/CATH LAB
INTRA_ARTERIAL | Status: AC
  Filled 2018-05-08: qty 10

## 2018-05-08 MED ORDER — ROSUVASTATIN CALCIUM 10 MG PO TABS
5.0000 mg | ORAL_TABLET | Freq: Every day | ORAL | Status: DC
  Administered 2018-05-08 – 2018-05-10 (×3): 5 mg via ORAL
  Filled 2018-05-08 (×3): qty 1

## 2018-05-08 MED ORDER — SODIUM CHLORIDE 0.9 % IV SOLN: INTRAVENOUS | Status: AC

## 2018-05-08 MED ORDER — ONDANSETRON HCL 4 MG/2ML IJ SOLN
4.0000 mg | Freq: Four times a day (QID) | INTRAMUSCULAR | Status: DC | PRN
  Administered 2018-05-09: 4 mg via INTRAVENOUS
  Filled 2018-05-08: qty 2

## 2018-05-08 MED ORDER — HEPARIN SODIUM (PORCINE) 1000 UNIT/ML IJ SOLN
INTRAMUSCULAR | Status: AC
  Filled 2018-05-08: qty 1

## 2018-05-08 MED ORDER — METHYLPREDNISOLONE SODIUM SUCC 125 MG IJ SOLR
INTRAMUSCULAR | Status: DC | PRN
  Administered 2018-05-08: 125 mg via INTRAVENOUS

## 2018-05-08 MED ORDER — MORPHINE SULFATE (PF) 4 MG/ML IV SOLN
INTRAVENOUS | Status: AC
  Administered 2018-05-08: 2 mg
  Filled 2018-05-08: qty 1

## 2018-05-08 MED ORDER — DIPHENHYDRAMINE HCL 50 MG/ML IJ SOLN
INTRAMUSCULAR | Status: AC
  Filled 2018-05-08: qty 1

## 2018-05-08 MED ORDER — SODIUM CHLORIDE 0.9% FLUSH
3.0000 mL | INTRAVENOUS | Status: DC | PRN
  Administered 2018-05-10: 3 mL via INTRAVENOUS
  Filled 2018-05-08: qty 3

## 2018-05-08 MED ORDER — IOHEXOL 350 MG/ML SOLN
INTRAVENOUS | Status: DC | PRN
  Administered 2018-05-08: 120 mL

## 2018-05-08 MED ORDER — HEPARIN (PORCINE) IN NACL 2-0.9 UNITS/ML
INTRAMUSCULAR | Status: AC | PRN
  Administered 2018-05-08 (×2): 500 mL

## 2018-05-08 MED ORDER — LIDOCAINE HCL (PF) 1 % IJ SOLN
INTRAMUSCULAR | Status: DC | PRN
  Administered 2018-05-08: 2 mL

## 2018-05-08 MED ORDER — ONDANSETRON HCL 4 MG/2ML IJ SOLN
INTRAMUSCULAR | Status: DC | PRN
  Administered 2018-05-08: 4 mg via INTRAVENOUS

## 2018-05-08 MED ORDER — MORPHINE SULFATE (PF) 10 MG/ML IV SOLN: 2.0000 mg | Freq: Once | INTRAVENOUS | Status: DC

## 2018-05-08 MED ORDER — MIDAZOLAM HCL 2 MG/2ML IJ SOLN
INTRAMUSCULAR | Status: DC | PRN
  Administered 2018-05-08: 1 mg via INTRAVENOUS

## 2018-05-08 MED ORDER — HEPARIN (PORCINE) IN NACL 100-0.45 UNIT/ML-% IJ SOLN
1100.0000 [IU]/h | INTRAMUSCULAR | Status: DC
  Administered 2018-05-08: 1100 [IU]/h via INTRAVENOUS
  Filled 2018-05-08: qty 250

## 2018-05-08 MED ORDER — FENTANYL CITRATE (PF) 100 MCG/2ML IJ SOLN
INTRAMUSCULAR | Status: DC | PRN
  Administered 2018-05-08: 25 ug via INTRAVENOUS

## 2018-05-08 MED ORDER — CLOPIDOGREL BISULFATE 75 MG PO TABS
75.0000 mg | ORAL_TABLET | Freq: Every day | ORAL | Status: DC
  Administered 2018-05-09 – 2018-05-11 (×3): 75 mg via ORAL
  Filled 2018-05-08 (×3): qty 1

## 2018-05-08 MED ORDER — MIDAZOLAM HCL 2 MG/2ML IJ SOLN
INTRAMUSCULAR | Status: AC
  Filled 2018-05-08: qty 2

## 2018-05-08 MED ORDER — LABETALOL HCL 5 MG/ML IV SOLN: 10.0000 mg | INTRAVENOUS | Status: AC | PRN

## 2018-05-08 MED ORDER — HEPARIN SODIUM (PORCINE) 5000 UNIT/ML IJ SOLN
4000.0000 [IU] | Freq: Once | INTRAMUSCULAR | Status: AC
  Administered 2018-05-08: 4000 [IU] via INTRAVENOUS

## 2018-05-08 SURGICAL SUPPLY — 11 items
CATH INFINITI 5 FR JL3.5 (CATHETERS) ×1 IMPLANT
CATH INFINITI JR4 5F (CATHETERS) ×1 IMPLANT
CATH VISTA GUIDE 6FR XBLAD3.0 (CATHETERS) ×1 IMPLANT
DEVICE RAD COMP TR BAND LRG (VASCULAR PRODUCTS) ×1 IMPLANT
GLIDESHEATH SLEND A-KIT 6F 22G (SHEATH) ×1 IMPLANT
GUIDEWIRE INQWIRE 1.5J.035X260 (WIRE) IMPLANT
INQWIRE 1.5J .035X260CM (WIRE) ×2
KIT HEART LEFT (KITS) ×2 IMPLANT
PACK CARDIAC CATHETERIZATION (CUSTOM PROCEDURE TRAY) ×2 IMPLANT
TRANSDUCER W/STOPCOCK (MISCELLANEOUS) ×2 IMPLANT
TUBING CIL FLEX 10 FLL-RA (TUBING) ×2 IMPLANT

## 2018-05-08 NOTE — Progress Notes (Signed)
Pt states, "chest pain has resolved."   Will continue to monitor.

## 2018-05-08 NOTE — ED Triage Notes (Signed)
Pt presents via [facility] for evaluation of possible stemi. Cp and back pain with nausea since yesterday. At PCP today, troponin 1.65 per office. Given nitro in route. Allergy to asa.

## 2018-05-08 NOTE — Progress Notes (Signed)
CRITICAL VALUE ALERT  Critical Value:  Troponin 1.29  Date & Time Notied:  05/08/2018 1350  Provider Notified: [name], MD  Orders Received/Actions taken: No new orders at this time.

## 2018-05-08 NOTE — Patient Instructions (Addendum)
[email or web address] or down load app on smart phone  Aunt Berenice Primas website lists multiple social resources for individuals such as: food, health, money, house hold goods, transit, medical supplies, job training and legal services.  Continue doing brain stimulating activities (puzzles, reading, adult coloring books, staying active) to keep memory sharp.   Continue to eat heart healthy diet (full of fruits, vegetables, whole grains, lean protein, water--limit salt, fat, and sugar intake) and increase physical activity as tolerated.   [name] , Thank you for taking time to come for your Medicare Wellness Visit. I appreciate your ongoing commitment to your health goals. Please review the following plan we discussed and let me know if I can assist you in the future.   These are the goals we discussed: Goals    . Patient Stated     I want to continue to eat healthy and exercise. Enjoy life, family and worship God.       This is a list of the screening recommended for you and due dates:  Health Maintenance  Topic Date Due  . Flu Shot  06/29/2018  . Pneumonia vaccines (2 of 2 - PPSV23) 07/20/2018  . Mammogram  08/19/2019  . Colon Cancer Screening  01/04/2020  . Tetanus Vaccine  06/23/2020  . DEXA scan (bone density measurement)  Completed  .  Hepatitis C: One time screening is recommended by Center for Disease Control  (CDC) for  adults born from [birth year] through 1965.   Completed  \ Health Maintenance, Female Adopting a healthy lifestyle and getting preventive care can go a long way to promote health and wellness. Talk with your health care provider about what schedule of regular examinations is right for you. This is a good chance for you to check in with your provider about disease prevention and staying healthy. In between checkups, there are plenty of things you can do on your own. Experts have done a lot of research about which lifestyle changes and preventive measures are most likely to  keep you healthy. Ask your health care provider for more information. Weight and diet Eat a healthy diet  Be sure to include plenty of vegetables, fruits, low-fat dairy products, and lean protein.  Do not eat a lot of foods high in solid fats, added sugars, or salt.  Get regular exercise. This is one of the most important things you can do for your health. ? Most adults should exercise for at least 150 minutes each week. The exercise should increase your heart rate and make you sweat (moderate-intensity exercise). ? Most adults should also do strengthening exercises at least twice a week. This is in addition to the moderate-intensity exercise.  Maintain a healthy weight  Body mass index (BMI) is a measurement that can be used to identify possible weight problems. It estimates body fat based on height and weight. Your health care provider can help determine your BMI and help you achieve or maintain a healthy weight.  For females 60 years of age and older: ? A BMI below 18.5 is considered underweight. ? A BMI of 18.5 to 24.9 is normal. ? A BMI of 25 to 29.9 is considered overweight. ? A BMI of 30 and above is considered obese.  Watch levels of cholesterol and blood lipids  You should start having your blood tested for lipids and cholesterol at 67 years of age, then have this test every 5 years.  You may need to have your cholesterol levels checked more often if: ?  Your lipid or cholesterol levels are high. ? You are older than 67 years of age. ? You are at high risk for heart disease.  Cancer screening Lung Cancer  Lung cancer screening is recommended for adults 62-65 years old who are at high risk for lung cancer because of a history of smoking.  A yearly low-dose CT scan of the lungs is recommended for people who: ? Currently smoke. ? Have quit within the past 15 years. ? Have at least a 30-pack-year history of smoking. A pack year is smoking an average of one pack of  cigarettes a day for 1 year.  Yearly screening should continue until it has been 15 years since you quit.  Yearly screening should stop if you develop a health problem that would prevent you from having lung cancer treatment.  Breast Cancer  Practice breast self-awareness. This means understanding how your breasts normally appear and feel.  It also means doing regular breast self-exams. Let your health care provider know about any changes, no matter how small.  If you are in your 20s or 30s, you should have a clinical breast exam (CBE) by a health care provider every 1-3 years as part of a regular health exam.  If you are 53 or older, have a CBE every year. Also consider having a breast X-ray (mammogram) every year.  If you have a family history of breast cancer, talk to your health care provider about genetic screening.  If you are at high risk for breast cancer, talk to your health care provider about having an MRI and a mammogram every year.  Breast cancer gene (BRCA) assessment is recommended for women who have family members with BRCA-related cancers. BRCA-related cancers include: ? Breast. ? Ovarian. ? Tubal. ? Peritoneal cancers.  Results of the assessment will determine the need for genetic counseling and BRCA1 and BRCA2 testing.  Cervical Cancer Your health care provider may recommend that you be screened regularly for cancer of the pelvic organs (ovaries, uterus, and vagina). This screening involves a pelvic examination, including checking for microscopic changes to the surface of your cervix (Pap test). You may be encouraged to have this screening done every 3 years, beginning at age 74.  For women ages 46-65, health care providers may recommend pelvic exams and Pap testing every 3 years, or they may recommend the Pap and pelvic exam, combined with testing for human papilloma virus (HPV), every 5 years. Some types of HPV increase your risk of cervical cancer. Testing for HPV  may also be done on women of any age with unclear Pap test results.  Other health care providers may not recommend any screening for nonpregnant women who are considered low risk for pelvic cancer and who do not have symptoms. Ask your health care provider if a screening pelvic exam is right for you.  If you have had past treatment for cervical cancer or a condition that could lead to cancer, you need Pap tests and screening for cancer for at least 20 years after your treatment. If Pap tests have been discontinued, your risk factors (such as having a new sexual partner) need to be reassessed to determine if screening should resume. Some women have medical problems that increase the chance of getting cervical cancer. In these cases, your health care provider may recommend more frequent screening and Pap tests.  Colorectal Cancer  This type of cancer can be detected and often prevented.  Routine colorectal cancer screening usually begins at 67 years  of age and continues through 67 years of age.  Your health care provider may recommend screening at an earlier age if you have risk factors for colon cancer.  Your health care provider may also recommend using home test kits to check for hidden blood in the stool.  A small camera at the end of a tube can be used to examine your colon directly (sigmoidoscopy or colonoscopy). This is done to check for the earliest forms of colorectal cancer.  Routine screening usually begins at age 44.  Direct examination of the colon should be repeated every 5-10 years through 67 years of age. However, you may need to be screened more often if early forms of precancerous polyps or small growths are found.  Skin Cancer  Check your skin from head to toe regularly.  Tell your health care provider about any new moles or changes in moles, especially if there is a change in a mole's shape or color.  Also tell your health care provider if you have a mole that is larger  than the size of a pencil eraser.  Always use sunscreen. Apply sunscreen liberally and repeatedly throughout the day.  Protect yourself by wearing long sleeves, pants, a wide-brimmed hat, and sunglasses whenever you are outside.  Heart disease, diabetes, and high blood pressure  High blood pressure causes heart disease and increases the risk of stroke. High blood pressure is more likely to develop in: ? People who have blood pressure in the high end of the normal range (130-139/85-89 mm Hg). ? People who are overweight or obese. ? People who are African American.  If you are 24-34 years of age, have your blood pressure checked every 3-5 years. If you are 29 years of age or older, have your blood pressure checked every year. You should have your blood pressure measured twice-once when you are at a hospital or clinic, and once when you are not at a hospital or clinic. Record the average of the two measurements. To check your blood pressure when you are not at a hospital or clinic, you can use: ? An automated blood pressure machine at a pharmacy. ? A home blood pressure monitor.  If you are between 68 years and 79 years old, ask your health care provider if you should take aspirin to prevent strokes.  Have regular diabetes screenings. This involves taking a blood sample to check your fasting blood sugar level. ? If you are at a normal weight and have a low risk for diabetes, have this test once every three years after 67 years of age. ? If you are overweight and have a high risk for diabetes, consider being tested at a younger age or more often. Preventing infection Hepatitis B  If you have a higher risk for hepatitis B, you should be screened for this virus. You are considered at high risk for hepatitis B if: ? You were born in a country where hepatitis B is common. Ask your health care provider which countries are considered high risk. ? Your parents were born in a high-risk country, and  you have not been immunized against hepatitis B (hepatitis B vaccine). ? You have HIV or AIDS. ? You use needles to inject street drugs. ? You live with someone who has hepatitis B. ? You have had sex with someone who has hepatitis B. ? You get hemodialysis treatment. ? You take certain medicines for conditions, including cancer, organ transplantation, and autoimmune conditions.  Hepatitis C  Blood  testing is recommended for: ? Everyone born from [birth year] through 1965. ? Anyone with known risk factors for hepatitis C.  Sexually transmitted infections (STIs)  You should be screened for sexually transmitted infections (STIs) including gonorrhea and chlamydia if: ? You are sexually active and are younger than 67 years of age. ? You are older than 67 years of age and your health care provider tells you that you are at risk for this type of infection. ? Your sexual activity has changed since you were last screened and you are at an increased risk for chlamydia or gonorrhea. Ask your health care provider if you are at risk.  If you do not have HIV, but are at risk, it may be recommended that you take a prescription medicine daily to prevent HIV infection. This is called pre-exposure prophylaxis (PrEP). You are considered at risk if: ? You are sexually active and do not regularly use condoms or know the HIV status of your partner(s). ? You take drugs by injection. ? You are sexually active with a partner who has HIV.  Talk with your health care provider about whether you are at high risk of being infected with HIV. If you choose to begin PrEP, you should first be tested for HIV. You should then be tested every 3 months for as long as you are taking PrEP. Pregnancy  If you are premenopausal and you may become pregnant, ask your health care provider about preconception counseling.  If you may become pregnant, take 400 to 800 micrograms (mcg) of folic acid every day.  If you want to prevent  pregnancy, talk to your health care provider about birth control (contraception). Osteoporosis and menopause  Osteoporosis is a disease in which the bones lose minerals and strength with aging. This can result in serious bone fractures. Your risk for osteoporosis can be identified using a bone density scan.  If you are 47 years of age or older, or if you are at risk for osteoporosis and fractures, ask your health care provider if you should be screened.  Ask your health care provider whether you should take a calcium or vitamin D supplement to lower your risk for osteoporosis.  Menopause may have certain physical symptoms and risks.  Hormone replacement therapy may reduce some of these symptoms and risks. Talk to your health care provider about whether hormone replacement therapy is right for you. Follow these instructions at home:  Schedule regular health, dental, and eye exams.  Stay current with your immunizations.  Do not use any tobacco products including cigarettes, chewing tobacco, or electronic cigarettes.  If you are pregnant, do not drink alcohol.  If you are breastfeeding, limit how much and how often you drink alcohol.  Limit alcohol intake to no more than 1 drink per day for nonpregnant women. One drink equals 12 ounces of beer, 5 ounces of Laraina Sulton, or 1 ounces of hard liquor.  Do not use street drugs.  Do not share needles.  Ask your health care provider for help if you need support or information about quitting drugs.  Tell your health care provider if you often feel depressed.  Tell your health care provider if you have ever been abused or do not feel safe at home. This information is not intended to replace advice given to you by your health care provider. Make sure you discuss any questions you have with your health care provider. Document Released: 05/31/2011 Document Revised: 04/22/2016 Document Reviewed: 08/19/2015 Elsevier Interactive Patient Education  2018  [place].

## 2018-05-08 NOTE — ED Notes (Signed)
Received a call from [place] at [facility] - per Dr. [place], pt is to stop in the ER and be evaluated.

## 2018-05-08 NOTE — ED Provider Notes (Addendum)
[facility] EMERGENCY DEPARTMENT Provider Note   CSN: [id number] Arrival date & time: 05/08/18  1147   LEVEL 5 CAVEAT - ACUITY OF CONDITION  History   Chief Complaint Chief Complaint  Patient presents with  . Code STEMI    HPI KEYSHA [name] is a 67 y.o. female.  HPI  67 year old presents with chest pain and STEMI.  Patient went to her doctor's office today and had a troponin drawn that was over 1.  ECG changes concerning for STEMI by EMS and so called a STEMI.  The patient has been having chest pain since yesterday.  Waxes and wanes.  Resolved with nitro by EMS but then came back and is currently 5 or 6 out of 10.  No shortness of breath but did vomit.  Past Medical History:  Diagnosis Date  . Anemia   . Arthritis   . Cholelithiasis   . Chronic back pain   . Constipation   . Esophageal reflux   . Fibromyalgia   . Hyperlipidemia   . Hypertension   . Hypothyroid   . IBS (irritable bowel syndrome)   . Internal hemorrhoids without mention of complication   . Left leg pain   . Personal history of colonic polyps 05/06/2010   ADENOMATOUS POLYP    Patient Active Problem List   Diagnosis Date Noted  . Non-intractable vomiting with nausea 05/08/2018  . Vitamin D deficiency 11/11/2017  . Greater trochanteric bursitis of left hip 08/29/2017  . Degenerative disc disease, lumbar 08/29/2017  . Prediabetes 06/14/2016  . Obesity (BMI 30.0-34.9) 06/14/2016  . Routine general medical examination at a health care facility 12/31/2014  . HYPOKALEMIA, MILD 07/20/2013  . Osteopenia 07/20/2013  . Chest pain in adult 06/14/2012  . GERD (gastroesophageal reflux disease) 09/16/2011  . Generalized anxiety disorder 09/16/2011  . Constipation, slow transit 09/16/2011  . DJD (degenerative joint disease) of knee 06/25/2011  . Pure hyperglyceridemia 06/24/2011  . Hyperlipidemia with target LDL less than 100 07/30/2009  . Hypothyroidism 04/25/2009  . Essential hypertension,  benign 04/25/2009  . FIBROMYALGIA 04/25/2009    Past Surgical History:  Procedure Laterality Date  . ABDOMINAL HYSTERECTOMY    . CERVICAL DISC SURGERY     PLATE IN NECK & BACK  . CHOLECYSTECTOMY    . COLONOSCOPY  09/27/2011   NORMAL   . LUMBAR DISC SURGERY    . TONSILLECTOMY       OB History    Gravida  3   Para  2   Term      Preterm      AB  1   Living  2     SAB  1   TAB      Ectopic      Multiple      Live Births               Home Medications    Prior to Admission medications   Medication Sig Start Date End Date Taking? Authorizing Provider  levothyroxine (SYNTHROID, LEVOTHROID) 125 MCG tablet Take 1 tablet (125 mcg total) by mouth daily before breakfast. 01/11/18   [name], MD  losartan-hydrochlorothiazide (HYZAAR) 100-12.5 MG tablet Take 1 tablet by mouth daily. 01/11/17   [name], MD  potassium chloride SA (K-DUR,KLOR-CON) 20 MEQ tablet Take 1 tablet (20 mEq total) by mouth 2 (two) times daily. 06/14/16   [name], MD  pravastatin (PRAVACHOL) 20 MG tablet Take 1 tablet (20 mg total) by  mouth daily. 08/30/17   [name], MD  Probiotic Product (RESTORA) CAPS Take 1 capsule by mouth daily. 01/11/18   [name], MD  Vitamin D, Ergocalciferol, (DRISDOL) 50000 units CAPS capsule TAKE 1 CAPSULE (50,000 UNITS TOTAL) BY MOUTH EVERY 7 (SEVEN) DAYS. 04/28/18   Lyndal Pulley, DO    Family History Family History  Problem Relation Age of Onset  . Diabetes Mother        aunts and uncles  . Kidney disease Mother        stage 3  . Lung disease Mother        colapsed lung/in hospice  . COPD Mother   . Hypertension Mother   . Dementia Mother   . Breast cancer Maternal Aunt   . Heart failure Father        Mother  . Breast cancer Cousin   . Colon cancer Neg Hx     Social History Social History   Tobacco Use  . Smoking status: Never Smoker  . Smokeless tobacco: Never Used  Substance Use Topics  . Alcohol use: No     Alcohol/week: 0.0 oz  . Drug use: No     Allergies   Livalo [pitavastatin]; Aspirin; Gadolinium; Hydrocodone-acetaminophen; Iohexol; Lipitor [atorvastatin]; and Crestor [rosuvastatin calcium]   Review of Systems Review of Systems  Unable to perform ROS: Acuity of condition     Physical Exam Updated Vital Signs BP (!) 151/98   Temp (!) 97.2 F (36.2 C) (Temporal)   Resp 14   SpO2 95%   Physical Exam  Constitutional: She is oriented to person, place, and time. She appears well-developed and well-nourished. No distress.  HENT:  Head: Normocephalic and atraumatic.  Right Ear: External ear normal.  Left Ear: External ear normal.  Nose: Nose normal.  Eyes: Right eye exhibits no discharge. Left eye exhibits no discharge.  Cardiovascular: Normal rate, regular rhythm, normal heart sounds and intact distal pulses.  Pulmonary/Chest: Effort normal and breath sounds normal.  Abdominal: Soft. She exhibits no distension. There is no tenderness.  Neurological: She is alert and oriented to person, place, and time.  Skin: Skin is warm and dry. She is not diaphoretic.  Nursing note and vitals reviewed.    ED Treatments / Results  Labs (all labs ordered are listed, but only abnormal results are displayed) Labs Reviewed  CBC WITH DIFFERENTIAL/PLATELET  PROTIME-INR  APTT  COMPREHENSIVE METABOLIC PANEL  TROPONIN I  LIPID PANEL    EKG EKG Interpretation  Date/Time:  Monday May 08 2018 11:52:54 EDT Ventricular Rate:  97 PR Interval:    QRS Duration: 84 QT Interval:  407 QTC Calculation: 517 R Axis:   -33 Text Interpretation:  Sinus rhythm Consider left atrial enlargement Left axis deviation Abnrm T, consider ischemia, anterolateral lds ST elevation, consider lateral injury Prolonged QT interval Artifact in lead(s) I II aVR aVL ** ** ACUTE MI / STEMI ** ** Confirmed by Sherwood Gambler [phone]) on 05/08/2018 11:59:39 AM   Radiology Dg Chest 2 View  Result Date:  05/08/2018 CLINICAL DATA:  Upper posterior chest pain EXAM: CHEST - 2 VIEW COMPARISON:  10/19/2016 FINDINGS: Left base atelectasis. Heart is normal size. Right lung clear. No effusions or acute bony abnormality. IMPRESSION: Left base atelectasis. Electronically Signed   By: Rolm Baptise M.D.   On: 05/08/2018 10:00    Procedures .Critical Care Performed by: Sherwood Gambler, MD Authorized by: Sherwood Gambler, MD   Critical care provider statement:  Critical care time (minutes):  30   Critical care time was exclusive of:  Separately billable procedures and treating other patients   Critical care was necessary to treat or prevent imminent or life-threatening deterioration of the following conditions:  Cardiac failure   Critical care was time spent personally by me on the following activities:  Development of treatment plan with patient or surrogate, discussions with consultants, evaluation of patient's response to treatment, examination of patient, obtaining history from patient or surrogate, ordering and performing treatments and interventions, ordering and review of laboratory studies, pulse oximetry, re-evaluation of patient's condition and review of old charts   (including critical care time)  Medications Ordered in ED Medications  0.9 %  sodium chloride infusion ( Intravenous New Bag/Given 05/08/18 1201)  nitroGLYCERIN (NITROSTAT) SL tablet 0.4 mg (0.4 mg Sublingual Given 05/08/18 1200)  heparin injection 4,000 Units (4,000 Units Intravenous Given 05/08/18 1158)  morphine 4 MG/ML injection (2 mg  Given 05/08/18 1214)     Initial Impression / Assessment and Plan / ED Course  I have reviewed the triage vital signs and the nursing notes.  Pertinent labs & imaging results that were available during my care of the patient were reviewed by me and considered in my medical decision making (see chart for details).     Patient presents with STEMI.  Has new ST elevations in the lateral leads.   She is allergic to aspirin which she describes as a rash and may be swelling.  She will be given IV heparin bolus.  Taken to the Cath Lab with cardiology.  Final Clinical Impressions(s) / ED Diagnoses   Final diagnoses:  ST elevation myocardial infarction (STEMI), unspecified artery [facility])    ED Discharge Orders    None       Sherwood Gambler, MD 05/08/18 1722    Sherwood Gambler, MD 05/16/18 [phone]
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
## 2018-05-08 NOTE — Progress Notes (Signed)
ANTICOAGULATION CONSULT NOTE - Initial Consult  Pharmacy Consult for heparin Indication: chest pain/ACS   Allergies  Allergen Reactions  . Livalo [Pitavastatin]     myalgias  . Aspirin   . Gadolinium      Code: HIVES, Desc: patient unsure of which kind of dye she had a reaction to until reaction to Magnevist - may be allergic to x-ray contrast, Onset Date: [id number]   . Hydrocodone-Acetaminophen   . Iohexol   . Lipitor [Atorvastatin]     REACTION: Myalgias  . Crestor [Rosuvastatin Calcium]     rash    Patient Measurements:   Heparin Dosing Weight: 79kg  Vital Signs: Temp: 97.2 F (36.2 C) (06/10 1157) Temp Source: Temporal (06/10 1157) BP: 135/91 (06/10 1257) Pulse Rate: 83 (06/10 1257)  Labs: Recent Labs    05/08/18 1010 05/08/18 1155  HGB  --  14.0  HCT  --  40.8  PLT  --  350  APTT  --  31  LABPROT  --  13.2  INR  --  1.01  CREATININE 0.77  --     Estimated Creatinine Clearance: 77.2 mL/min (by C-G formula based on SCr of 0.77 mg/dL).  Assessment: 1 YOF presenting with CP s/p cath. To start heparin 8h post sheath removal- sheath out right before 1300 this afternoon. No anticoagulation PTA. No overt bleeding noted.  Goal of Therapy:  Heparin level 0.3-0.7 units/ml Monitor platelets by anticoagulation protocol: Yes   Plan:  Start heparin this evening at 2100 at rate of 1100 units/hr First heparin level with AM labs Daily heparin level and CBC Follow cardiology plans  Marquies Wanat D. Aalijah Lanphere, PharmD, BCPS Clinical Pharmacist [phone] Please check AMION for all [place] numbers 05/08/2018 1:39 PM

## 2018-05-08 NOTE — H&P (Addendum)
The patient has been seen in conjunction with Reino Bellis, NP. All aspects of care have been considered and discussed. The patient has been personally interviewed, examined, and all clinical data has been reviewed.   Patient was interviewed and examined in trauma B in the emergency room after being brought in with complaints of chest discomfort.  Evaluation was acute in the setting of possible ST elevation myocardial infarction.  She had ongoing chest pain without ST elevation but clearly new anterior T wave abnormality.  She graded her residual pain was less than when it started the prior evening and was 7/10.  Exam revealed hypertension, anxious affect, no dyspnea, skin warm and dry, neck veins not distended, no JVD, lungs clear, cardiac exam with tachycardia but otherwise unremarkable.  EKG with sinus rhythm, leftward axis, symmetrical T wave inversion V1 through V3 clearly different than EKGs performed prior to this year.  A troponin performed by the primary care physician is known to be elevated  Acute coronary syndrome with ongoing chest pain, and risk factors including the anniversary of her husband's death, and other home issues.  She was counseled to undergo urgent catheterization to define anatomy and help guide therapy.  The procedure including risk of stroke, death, myocardial infarction, bleeding, limb ischemia, emergency surgery, among others were discussed in detail and accepted.  31 minutes of critical care time.  History & Physical    Patient ID: [name] MRN: [id number], DOB/AGE: 1951-01-27   Admit date: 05/08/2018   Primary Physician: [name], MD Primary Cardiologist: [name] [phone])   Patient Profile    67 yo female with PMH of HTN, HL, hypothyroidism, GERD and fibromyalgia who presented to PCP with chest pain with EKG concerning for STEMI.   Past Medical History   Past Medical History:  Diagnosis Date  . Anemia   . Arthritis   . Cholelithiasis     . Chronic back pain   . Constipation   . Esophageal reflux   . Fibromyalgia   . Hyperlipidemia   . Hypertension   . Hypothyroid   . IBS (irritable bowel syndrome)   . Internal hemorrhoids without mention of complication   . Left leg pain   . Personal history of colonic polyps 05/06/2010   ADENOMATOUS POLYP    Past Surgical History:  Procedure Laterality Date  . ABDOMINAL HYSTERECTOMY    . CERVICAL DISC SURGERY     PLATE IN NECK & BACK  . CHOLECYSTECTOMY    . COLONOSCOPY  09/27/2011   NORMAL   . LUMBAR DISC SURGERY    . TONSILLECTOMY       Allergies  Allergies  Allergen Reactions  . Livalo [Pitavastatin]     myalgias  . Aspirin   . Gadolinium      Code: HIVES, Desc: patient unsure of which kind of dye she had a reaction to until reaction to Magnevist - may be allergic to x-ray contrast, Onset Date: [id number]   . Hydrocodone-Acetaminophen   . Iohexol   . Lipitor [Atorvastatin]     REACTION: Myalgias  . Crestor [Rosuvastatin Calcium]     rash    History of Present Illness    [name] is a 67 yo female with PMH of HTN, HL, hypothyroidism, GERD and fibromyalgia. Reports having a cath done many years ago by Dr. Doylene Canard which she thinks was normal. Was seen back in 2013 with Dr. [name] for chest pain and underwent a dobutamine stress echo. Has been followed by  her PCP since that time. Reports having a milkshake last evening around 6pm and then developed onset of chest pressure radiating through to her back. Felt nauseated and vomited. Chest pain continued through the evening and she reports was pretty intense. Eventually eased. This morning symptoms returned and worsened. She had an appt with her PCP and went to this with her symptoms. EKG done at the office showed ST depression in lead II, III and elevation in v2. Trop drawn in the office noted at 1.65. EMS called and Code STEMI called in the field. She was seen by Dr. [name] in the ED given 4000 units of heparin, 2 SL nitro,  and 71m morphine. Brought to the cath lab for emergent cardiac cath. Of note, reports an allergy to ASA of hives. Therefore did not receive in the field.   Home Medications    Prior to Admission medications   Medication Sig Start Date End Date Taking? Authorizing Provider  levothyroxine (SYNTHROID, LEVOTHROID) 125 MCG tablet Take 1 tablet (125 mcg total) by mouth daily before breakfast. 01/11/18   JJanith Lima MD  losartan-hydrochlorothiazide (HYZAAR) 100-12.5 MG tablet Take 1 tablet by mouth daily. 01/11/17   JJanith Lima MD  potassium chloride SA (K-DUR,KLOR-CON) 20 MEQ tablet Take 1 tablet (20 mEq total) by mouth 2 (two) times daily. 06/14/16   JJanith Lima MD  pravastatin (PRAVACHOL) 20 MG tablet Take 1 tablet (20 mg total) by mouth daily. 08/30/17   JJanith Lima MD  Probiotic Product (RESTORA) CAPS Take 1 capsule by mouth daily. 01/11/18   JJanith Lima MD  Vitamin D, Ergocalciferol, (DRISDOL) 50000 units CAPS capsule TAKE 1 CAPSULE (50,000 UNITS TOTAL) BY MOUTH EVERY 7 (SEVEN) DAYS. 04/28/18   SLyndal Pulley DO    Family History    Family History  Problem Relation Age of Onset  . Diabetes Mother        aunts and uncles  . Kidney disease Mother        stage 3  . Lung disease Mother        colapsed lung/in hospice  . COPD Mother   . Hypertension Mother   . Dementia Mother   . Breast cancer Maternal Aunt   . Heart failure Father        Mother  . Breast cancer Cousin   . Colon cancer Neg Hx     Social History    Social History   Socioeconomic History  . Marital status: Widowed    Spouse name: Not on file  . Number of children: 2  . Years of education: Not on file  . Highest education level: Not on file  Occupational History  . Occupation: housewife    Employer: UNEMPLOYED   Social Needs  . Financial resource strain: Not hard at all  . Food insecurity:    Worry: Never true    Inability: Never true  . Transportation needs:    Medical: No     Non-medical: No  Tobacco Use  . Smoking status: Never Smoker  . Smokeless tobacco: Never Used  Substance and Sexual Activity  . Alcohol use: No    Alcohol/week: 0.0 oz  . Drug use: No  . Sexual activity: Not Currently  Lifestyle  . Physical activity:    Days per week: 0 days    Minutes per session: 0 min  . Stress: Not at all  Relationships  . Social connections:    Talks on phone: More than three  times a week    Gets together: More than three times a week    Attends religious service: More than 4 times per year    Active member of club or organization: Yes    Attends meetings of clubs or organizations: More than 4 times per year    Relationship status: Married  . Intimate partner violence:    Fear of current or ex partner: No    Emotionally abused: No    Physically abused: No    Forced sexual activity: No  Other Topics Concern  . Not on file  Social History Narrative   Daily Caffeine Use     Review of Systems    General:  No chills, fever, night sweats or weight changes.  Cardiovascular:  No chest pain, dyspnea on exertion, edema, orthopnea, palpitations, paroxysmal nocturnal dyspnea. Dermatological: No rash, lesions/masses Respiratory: No cough, dyspnea Urologic: No hematuria, dysuria Abdominal:   No nausea, vomiting, diarrhea, bright red blood per rectum, melena, or hematemesis Neurologic:  No visual changes, wkns, changes in mental status. All other systems reviewed and are otherwise negative except as noted above.  Physical Exam    Blood pressure (!) 151/98, temperature (!) 97.2 F (36.2 C), temperature source Temporal, resp. rate 14, SpO2 95 %.  General: Pleasant, AAF, NAD Psych: Normal affect. Neuro: Alert and oriented X 3. Moves all extremities spontaneously. HEENT: Normal  Neck: Supple without bruits or JVD. Lungs:  Resp regular and unlabored, CTA. Heart: RRR no s3, s4, or murmurs. Abdomen: Soft, non-tender, non-distended, BS + x 4.  Extremities: No  clubbing, cyanosis or edema. DP/PT/Radials 2+ and equal bilaterally.  Labs    Troponin (Point of Care Test) No results for input(s): TROPIPOC in the last 72 hours. No results for input(s): CKTOTAL, CKMB, TROPONINI in the last 72 hours. Lab Results  Component Value Date   WBC 7.8 10/12/2017   HGB 13.2 10/12/2017   HCT 39.3 10/12/2017   MCV 82.2 10/12/2017   PLT 339.0 10/12/2017    Recent Labs  Lab 05/08/18 1010  NA 141  K 3.0*  CL 98  CO2 31  BUN 7  CREATININE 0.77  CALCIUM 9.8  GLUCOSE 132*   Lab Results  Component Value Date   CHOL 184 05/08/2018   HDL 43.00 05/08/2018   LDLCALC 110 (H) 05/08/2018   TRIG 158.0 (H) 05/08/2018   Lab Results  Component Value Date   DDIMER 0.45 06/14/2012     Radiology Studies    Dg Chest 2 View  Result Date: 05/08/2018 CLINICAL DATA:  Upper posterior chest pain EXAM: CHEST - 2 VIEW COMPARISON:  10/19/2016 FINDINGS: Left base atelectasis. Heart is normal size. Right lung clear. No effusions or acute bony abnormality. IMPRESSION: Left base atelectasis. Electronically Signed   By: Rolm Baptise M.D.   On: 05/08/2018 10:00    ECG & Cardiac Imaging    EKG: SR with ST depression in lead II, III and ST elevation in v2.   Assessment & Plan    67 yo female with PMH of HTN, HL, hypothyroidism, GERD and fibromyalgia who presented to PCP with chest pain with EKG concerning for STEMI.   1. Chest pain with abnormal EKG: Reports symptoms developed last night around 6pm. Continued through the evening into this morning. EKG at PCP office showed ST depression in lead II, III and ST elevation in v2. Trop from office appt was 1.65. She was still having 7/10 chest pain on arrival. Seen by Dr. [name] and  brought to the cath lab for cardiac cath.   2. HL: several intolerances to statins noted. Currently on pravastatin 45m daily at home.   3. HTN: on hyzaar 100/12.5. Allergy to ACEi  4. Hypothyroidism: continue synthroid 1244m   Severity of  Illness: The appropriate patient status for this patient is INPATIENT. Inpatient status is judged to be reasonable and necessary in order to provide the required intensity of service to ensure the patient's safety. The patient's presenting symptoms, physical exam findings, and initial radiographic and laboratory data in the context of their chronic comorbidities is felt to place them at high risk for further clinical deterioration. Furthermore, it is not anticipated that the patient will be medically stable for discharge from the hospital within 2 midnights of admission. The following factors support the patient status of inpatient.   " The patient's presenting symptoms include chest pain, nausea, vomiting. " The worrisome physical exam findings include normal exam. " The initial radiographic and laboratory data are worrisome because of abnormal EKG, elevated trop. " The chronic co-morbidities include HTN, HL.   * I certify that at the point of admission it is my clinical judgment that the patient will require inpatient hospital care spanning beyond 2 midnights from the point of admission due to high intensity of service, high risk for further deterioration and high frequency of surveillance required.*   Signed, LiReino BellisNP-C Pager [phone]/08/2018, 12:23 PM

## 2018-05-08 NOTE — Patient Instructions (Signed)
Chest Wall Pain °Chest wall pain is pain in or around the bones and muscles of your chest. Sometimes, an injury causes this pain. Sometimes, the cause may not be known. This pain may take several weeks or longer to get better. °Follow these instructions at home: °Pay attention to any changes in your symptoms. Take these actions to help with your pain: °· Rest as told by your health care provider. °· Avoid activities that cause pain. These include any activities that use your chest muscles or your abdominal and side muscles to lift heavy items. °· If directed, apply ice to the painful area: °? Put ice in a plastic bag. °? Place a towel between your skin and the bag. °? Leave the ice on for 20 minutes, 2-3 times per day. °· Take over-the-counter and prescription medicines only as told by your health care provider. °· Do not use tobacco products, including cigarettes, chewing tobacco, and e-cigarettes. If you need help quitting, ask your health care provider. °· Keep all follow-up visits as told by your health care provider. This is important. ° °Contact a health care provider if: °· You have a fever. °· Your chest pain becomes worse. °· You have new symptoms. °Get help right away if: °· You have nausea or vomiting. °· You feel sweaty or light-headed. °· You have a cough with phlegm (sputum) or you cough up blood. °· You develop shortness of breath. °This information is not intended to replace advice given to you by your health care provider. Make sure you discuss any questions you have with your health care provider. °Document Released: 11/15/2005 Document Revised: 03/25/2016 Document Reviewed: 02/10/2015 °Elsevier Interactive Patient Education © 2018 [employer] ° °

## 2018-05-08 NOTE — Progress Notes (Addendum)
Subjective:   [name] is a 67 y.o. female who presents for Medicare Annual (Subsequent) preventive examination.  Review of Systems:  No ROS.  Medicare Wellness Visit. Additional risk factors are reflected in the social history.   Cardiac Risk Factors include: advanced age (>42mn, >>8women);dyslipidemia;hypertension Sleep patterns: feels rested on waking, gets up 1 times nightly to void and sleeps7 hours nightly.    Home Safety/Smoke Alarms: Feels safe in home. Smoke alarms in place.  Living environment; residence and Firearm Safety: 1-story house/ trailer, no firearms Lives alone, no needs for DME, good support system. Seat Belt Safety/Bike Helmet: Wears seat belt.      Objective:     Vitals: BP 132/86   Pulse 83   Temp 98.4 F (36.9 C)   Resp 17   Ht 5' 7"  (1.702 m)   Wt 186 lb (84.4 kg)   SpO2 99%   BMI 29.13 kg/m   Body mass index is 29.13 kg/m.  Advanced Directives 05/08/2018 01/03/2017 12/20/2016 01/07/2016  Does Patient Have a Medical Advance Directive? No No No No  Would patient like information on creating a medical advance directive? Yes (ED - Information included in AVS) - - No - patient declined information    Tobacco Social History   Tobacco Use  Smoking Status Never Smoker  Smokeless Tobacco Never Used     Counseling given: Not Answered   Past Medical History:  Diagnosis Date  . Anemia   . Arthritis   . Cholelithiasis   . Chronic back pain   . Constipation   . Esophageal reflux   . Fibromyalgia   . Hyperlipidemia   . Hypertension   . Hypothyroid   . IBS (irritable bowel syndrome)   . Internal hemorrhoids without mention of complication   . Left leg pain   . Personal history of colonic polyps 05/06/2010   ADENOMATOUS POLYP   Past Surgical History:  Procedure Laterality Date  . ABDOMINAL HYSTERECTOMY    . CERVICAL DISC SURGERY     PLATE IN NECK & BACK  . CHOLECYSTECTOMY    . COLONOSCOPY  09/27/2011   NORMAL   . LEFT HEART CATH  AND CORONARY ANGIOGRAPHY N/A 05/08/2018   Procedure: LEFT HEART CATH AND CORONARY ANGIOGRAPHY;  Surgeon: SBelva Crome MD;  [facility];  Service: Cardiovascular;  Laterality: N/A;  . [place]   . TONSILLECTOMY     Family History  Problem Relation Age of Onset  . Diabetes Mother        aunts and uncles  . Kidney disease Mother        stage 3  . Lung disease Mother        colapsed lung/in hospice  . COPD Mother   . Hypertension Mother   . Dementia Mother   . Breast cancer Maternal Aunt   . Heart failure Father        Mother  . Breast cancer Cousin   . Colon cancer Neg Hx    Social History   Socioeconomic History  . Marital status: Widowed    Spouse name: Not on file  . Number of children: 2  . Years of education: Not on file  . Highest education level: Not on file  Occupational History  . Occupation: housewife    Employer: UNEMPLOYED   Social Needs  . Financial resource strain: Not hard at all  . Food insecurity:    Worry: Never true  Inability: Never true  . Transportation needs:    Medical: No    Non-medical: No  Tobacco Use  . Smoking status: Never Smoker  . Smokeless tobacco: Never Used  Substance and Sexual Activity  . Alcohol use: No    Alcohol/week: 0.0 oz  . Drug use: No  . Sexual activity: Not Currently  Lifestyle  . Physical activity:    Days per week: 0 days    Minutes per session: 0 min  . Stress: Not at all  Relationships  . Social connections:    Talks on phone: More than three times a week    Gets together: More than three times a week    Attends religious service: More than 4 times per year    Active member of club or organization: Yes    Attends meetings of clubs or organizations: More than 4 times per year    Relationship status: Married  Other Topics Concern  . Not on file  Social History Narrative   Daily Caffeine Use    No facility-administered encounter medications on file as of 05/08/2018.     Outpatient Encounter Medications as of 05/08/2018  Medication Sig  . levothyroxine (SYNTHROID, LEVOTHROID) 125 MCG tablet Take 1 tablet (125 mcg total) by mouth daily before breakfast.  . losartan-hydrochlorothiazide (HYZAAR) 100-12.5 MG tablet Take 1 tablet by mouth daily.  . potassium chloride SA (K-DUR,KLOR-CON) 20 MEQ tablet Take 1 tablet (20 mEq total) by mouth 2 (two) times daily.  . pravastatin (PRAVACHOL) 20 MG tablet Take 1 tablet (20 mg total) by mouth daily.  . Probiotic Product (RESTORA) CAPS Take 1 capsule by mouth daily.  . Vitamin D, Ergocalciferol, (DRISDOL) 50000 units CAPS capsule TAKE 1 CAPSULE (50,000 UNITS TOTAL) BY MOUTH EVERY 7 (SEVEN) DAYS.  . [DISCONTINUED] meloxicam (MOBIC) 15 MG tablet TAKE 1 TABLET BY MOUTH EVERY DAY (Patient not taking: Reported on 05/08/2018)    Activities of Daily Living In your present state of health, do you have any difficulty performing the following activities: 05/08/2018  Hearing? N  Vision? N  Difficulty concentrating or making decisions? N  Walking or climbing stairs? N  Dressing or bathing? N  Doing errands, shopping? N  Preparing Food and eating ? N  Using the Toilet? N  In the past six months, have you accidently leaked urine? N  Do you have problems with loss of bowel control? N  Managing your Medications? N  Managing your Finances? N  Housekeeping or managing your Housekeeping? N  Some recent data might be hidden    Patient Care Team: [name], MD as PCP - General    Assessment:   This is a routine wellness examination for [name]. Physical assessment deferred to PCP.   Exercise Activities and Dietary recommendations Current Exercise Habits: Home exercise routine, Type of exercise: walking, Time (Minutes): 55, Intensity: Mild, Exercise limited by: None identified  Diet (meal preparation, eat out, water intake, caffeinated beverages, dairy products, fruits and vegetables): in general, a "healthy" diet  , well  balanced   Reviewed heart healthy diet, Encouraged patient to increase daily water and fluid intake.  Goals    . Patient Stated     I want to continue to eat healthy and exercise. Enjoy life, family and worship God.       Fall Risk Fall Risk  05/08/2018 07/12/2017 01/01/2015 08/10/2013  Falls in the past year? No No No No    Depression Screen PHQ 2/9 Scores 05/08/2018 07/12/2017 01/01/2015  08/10/2013  PHQ - 2 Score 0 0 0 0  PHQ- 9 Score 0 - - -     Cognitive Function       Ad8 score reviewed for issues:  Issues making decisions: no  Less interest in hobbies / activities: no  Repeats questions, stories (family complaining): no  Trouble using ordinary gadgets (microwave, computer, phone):no  Forgets the month or year: no  Mismanaging finances: no  Remembering appts: no  Daily problems with thinking and/or memory: no Ad8 score is= 0    Immunization History  Administered Date(s) Administered  . Influenza Split 11/15/2011  . Influenza Whole 12/11/2009  . Influenza, High Dose Seasonal PF 10/19/2016, 12/01/2017  . Influenza,inj,Quad PF,6+ Mos 08/10/2013, 09/18/2015  . Pneumococcal Conjugate-13 06/14/2016  . Pneumococcal Polysaccharide-23 07/20/2013  . Td 06/23/2010  . Zoster 11/06/2013   Screening Tests Health Maintenance  Topic Date Due  . INFLUENZA VACCINE  06/29/2018  . PNA vac Low Risk Adult (2 of 2 - PPSV23) 07/20/2018  . MAMMOGRAM  08/19/2019  . COLONOSCOPY  01/04/2020  . TETANUS/TDAP  06/23/2020  . DEXA SCAN  Completed  . Hepatitis C Screening  Completed       Plan:    Continue doing brain stimulating activities (puzzles, reading, adult coloring books, staying active) to keep memory sharp.   Continue to eat heart healthy diet (full of fruits, vegetables, whole grains, lean protein, water--limit salt, fat, and sugar intake) and increase physical activity as tolerated.  I have personally reviewed and noted the following in the patient's chart:    . Medical and social history . Use of alcohol, tobacco or illicit drugs  . Current medications and supplements . Functional ability and status . Nutritional status . Physical activity . Advanced directives . List of other physicians . Vitals . Screenings to include cognitive, depression, and falls . Referrals and appointments  In addition, I have reviewed and discussed with patient certain preventive protocols, quality metrics, and best practice recommendations. A written personalized care plan for preventive services as well as general preventive health recommendations were provided to patient.     Michiel Cowboy, RN  05/08/2018  Medical screening examination/treatment/procedure(s) were performed by non-physician practitioner and as supervising physician I was immediately available for consultation/collaboration. I agree with above. [name], MD

## 2018-05-09 LAB — BASIC METABOLIC PANEL
ANION GAP: 8 (ref 5–15)
BUN: 6 mg/dL (ref 6–20)
CALCIUM: 9 mg/dL (ref 8.9–10.3)
CO2: 26 mmol/L (ref 22–32)
Chloride: 106 mmol/L (ref 101–111)
Creatinine, Ser: 0.89 mg/dL (ref 0.44–1.00)
GFR calc Af Amer: >60 mL/min (ref >60)
Glucose, Bld: 183 mg/dL — ABNORMAL HIGH (ref 65–99)
POTASSIUM: 4.2 mmol/L (ref 3.5–5.1)
SODIUM: 140 mmol/L (ref 135–145)

## 2018-05-09 LAB — CBC
HCT: 36.9 % (ref 36.0–46.0)
HEMOGLOBIN: 12.5 g/dL (ref 12.0–15.0)
MCH: 27.1 pg (ref 26.0–34.0)
MCHC: 33.9 g/dL (ref 30.0–36.0)
MCV: 80 fL (ref 78.0–100.0)
Platelets: 352 10*3/uL (ref 150–400)
RBC: 4.61 MIL/uL (ref 3.87–5.11)
RDW: 13.4 % (ref 11.5–15.5)
WBC: 12.3 10*3/uL — AB (ref 4.0–10.5)

## 2018-05-09 LAB — TROPONIN I
TROPONIN I: 0.46 ng/mL — AB (ref <0.03)
TROPONIN I: 0.45 ng/mL — AB (ref <0.03)
Troponin I: 0.47 ng/mL (ref <0.03)

## 2018-05-09 LAB — HEPARIN LEVEL (UNFRACTIONATED): HEPARIN UNFRACTIONATED: 0.53 [IU]/mL (ref 0.30–0.70)

## 2018-05-09 MED ORDER — MORPHINE SULFATE (PF) 2 MG/ML IV SOLN
2.0000 mg | Freq: Once | INTRAVENOUS | Status: AC
  Administered 2018-05-09: 2 mg via INTRAVENOUS
  Filled 2018-05-09: qty 1

## 2018-05-09 MED ORDER — LEVOTHYROXINE SODIUM 25 MCG PO TABS
125.0000 ug | ORAL_TABLET | Freq: Every day | ORAL | Status: DC
  Administered 2018-05-09 – 2018-05-11 (×3): 125 ug via ORAL
  Filled 2018-05-09 (×3): qty 1

## 2018-05-09 MED ORDER — ISOSORBIDE MONONITRATE ER 30 MG PO TB24
30.0000 mg | ORAL_TABLET | Freq: Every day | ORAL | Status: DC
  Administered 2018-05-09 – 2018-05-11 (×3): 30 mg via ORAL
  Filled 2018-05-09 (×3): qty 1

## 2018-05-09 MED FILL — Verapamil HCl IV Soln 2.5 MG/ML: INTRAVENOUS | Qty: 2 | Status: AC

## 2018-05-09 MED FILL — Heparin Sod (Porcine)-NaCl IV Soln 1000 Unit/500ML-0.9%: INTRAVENOUS | Qty: 1000 | Status: AC

## 2018-05-09 NOTE — Progress Notes (Addendum)
The patient has been seen in conjunction with Lorella Nimrod, MD. All aspects of care have been considered and discussed. The patient has been personally interviewed, examined, and all clinical data has been reviewed.   She had recurrent chest discomfort this morning ST elevation 1, AVL, V2 and V3.  Reinstitution of IV nitroglycerin helped.  Will add oral long-acting nitrate therapy, wean and DC IV nitroglycerin.  Continue clopidogrel, aspirin, beta-blocker therapy, statin therapy.  Recheck TSH  Overall clinical status is somewhat labile.  Acute coronary syndrome due either to stress cardiomyopathy versus diagonal #2 obstructive disease.  The vessel is too small to intervene upon.  Plan medical therapy, risk factor modification, Phase 1 and 2 cardiac rehab.  Discontinue IV heparin.  ECG in a.m.  Progress Note  Patient Name: [name] Date of Encounter: 05/09/2018  Primary Cardiologist: [name].  Subjective   Patient was complaining of chest pain, 6/10 in intensity, squeezing, radiating to back and bilateral neck associated with nausea and mild shortness of breath, stating is the same pain like yesterday when she came to ED and found to have STEMI and was taken to Cath Lab.  Inpatient Medications    Scheduled Meds: . clopidogrel  75 mg Oral Q breakfast  . metoprolol succinate  25 mg Oral Daily  .  morphine injection  2 mg Intravenous Once  . potassium chloride  40 mEq Oral TID  . rosuvastatin  5 mg Oral q1800  . sodium chloride flush  3 mL Intravenous Q12H   Continuous Infusions: . sodium chloride 20 mL/hr at 05/09/18 0800  . sodium chloride    . heparin 1,100 Units/hr (05/09/18 0800)  . nitroGLYCERIN 20 mcg/min (05/09/18 0834)   PRN Meds: sodium chloride, nitroGLYCERIN, ondansetron (ZOFRAN) IV, sodium chloride flush   Vital Signs    Vitals:   05/09/18 0600 05/09/18 0700 05/09/18 0800 05/09/18 0811  BP: 107/72 107/67 (!) 110/95   Pulse: 66 66 73   Resp: 16 15 18      Temp:    98.1 F (36.7 C)  TempSrc:    Oral  SpO2: 97% 97% 98%   Weight:      Height:        Intake/Output Summary (Last 24 hours) at 05/09/2018 0921 Last data filed at 05/09/2018 0800 Gross per 24 hour  Intake 1276.04 ml  Output 3075 ml  Net -1798.96 ml    I/O since admission:   Filed Weights   05/08/18 1342  Weight: 191 lb 5.8 oz (86.8 kg)    Telemetry     - Personally Reviewed.  Remained in sinus rhythm with frequent PVCs.  ECG    ECG (independently read by me): This morning shows T wave inversion in aVL, V2 and V3.  Repeat EKG shows mildly elevated ST segment in aVL and V2.  Consistent with diagonal artery blockage.  Physical Exam   BP (!) 110/95   Pulse 73   Temp 98.1 F (36.7 C) (Oral)   Resp 18   Ht 5' 5"  (1.651 m)   Wt 191 lb 5.8 oz (86.8 kg)   SpO2 98%   BMI 31.84 kg/m  General: Alert, oriented, appears little anxious, in no acute distress. Skin: normal turgor, no rashes, warm and dry HEENT: Normocephalic, atraumatic.  Neck: No JVD, no carotid bruits; normal carotid upstroke Lungs: Few basal crackles. Chest wall: Mild intrascapular tenderness. Heart: PMI not displaced, RRR, s1 s2 normal,  no rubs, murmur, gallops, thrills, or heaves Abdomen: soft, nontender;  no hepatosplenomehaly, BS+; abdominal aorta nontender and not dilated by palpation. Pulses 2+ Extremities: Trace pedal edema ,Homan's sign negative  Neurologic: grossly nonfocal; Cranial nerves grossly wnl Psychologic: Appears little anxious.  Labs    Chemistry Recent Labs  Lab 05/08/18 1010 05/08/18 1155 05/09/18 0317  NA 141 140 140  K 3.0* 4.0 4.2  CL 98 99* 106  CO2 31 27 26   GLUCOSE 132* 120* 183*  BUN 7 6 6   CREATININE 0.77 0.89 0.89  CALCIUM 9.8 9.2 9.0  PROT  --  7.4  --   ALBUMIN  --  3.8  --   AST  --  49*  --   ALT  --  10*  --   ALKPHOS  --  77  --   BILITOT  --  3.2*  --   GFRNONAA  --  >60 >60  GFRAA  --  >60 >60  ANIONGAP  --  14 8     Hematology Recent  Labs  Lab 05/08/18 1155 05/09/18 0317  WBC 12.9* 12.3*  RBC 5.18* 4.61  HGB 14.0 12.5  HCT 40.8 36.9  MCV 78.8 80.0  MCH 27.0 27.1  MCHC 34.3 33.9  RDW 13.2 13.4  PLT 350 352    Cardiac Enzymes Recent Labs  Lab 05/08/18 1155 05/08/18 1431  TROPONINI 1.29* 0.92*   No results for input(s): TROPIPOC in the last 168 hours.   BNPNo results for input(s): BNP, PROBNP in the last 168 hours.   DDimer No results for input(s): DDIMER in the last 168 hours.   Lipid Panel     Component Value Date/Time   CHOL 200 05/08/2018 1155   TRIG 148 05/08/2018 1155   HDL 52 05/08/2018 1155   CHOLHDL 3.8 05/08/2018 1155   VLDL 30 05/08/2018 1155   LDLCALC 118 (H) 05/08/2018 1155   LDLDIRECT 88.0 10/19/2016 0844    Radiology    Dg Chest 2 View  Result Date: 05/08/2018 CLINICAL DATA:  Upper posterior chest pain EXAM: CHEST - 2 VIEW COMPARISON:  10/19/2016 FINDINGS: Left base atelectasis. Heart is normal size. Right lung clear. No effusions or acute bony abnormality. IMPRESSION: Left base atelectasis. Electronically Signed   By: Rolm Baptise M.D.   On: 05/08/2018 10:00   Dg Chest Portable 1 View  Result Date: 05/08/2018 CLINICAL DATA:  STEMI EXAM: PORTABLE CHEST 1 VIEW COMPARISON:  05/08/2018 FINDINGS: Cardiac shadow is stable. The lungs are well aerated bilaterally. Previously seen left basilar atelectasis has resolved. No acute bony abnormality is seen. IMPRESSION: No acute abnormality noted. Electronically Signed   By: Inez Catalina M.D.   On: 05/08/2018 12:23    Cardiac Studies   Urgent left cardiac catheterization. Conclusion    Acute coronary syndrome with anterior wall motion abnormality and essentially widely patent coronary arteries.  Suspect variant stress cardiomyopathy.  (Cannot totally exclude transient occlusion and recanalization of the first diagonal which is relatively small and has moderate somewhat hazy disease in the mid segment up to 70%.)  Normal left main  LAD is  large, wraps around the left ventricular apex, and gives origin to 3 diagonal branches, the second of which is discussed above with mid vessel 70% narrowing in an artery that is less than 2 mm in diameter.  Beyond that there is either a small branch or occlusion of the branch.  Images are ambiguous.  Circumflex is large, first marginal and second marginal widely patent.  Second marginal bifurcates.  Right coronary is tortuous but  widely patent.  Abnormal left ventricular wall motion with mid to distal anterior wall akinesis, EF 45%, and elevated LVEDP consistent with acute combined systolic and diastolic heart failure.  RECOMMENDATIONS:   Risk factor modification: Blood pressure 130/80 mmHg or less, LDL cholesterol less than 70, check A1c, and encourage exercise when appropriate.  Add low-dose beta-blocker  IV nitroglycerin and IV heparin x24 hours then wean.  2D Doppler echocardiogram tomorrow.     Patient Profile     67 y.o. female with PMH of HTN, HL, hypothyroidism, GERD and fibromyalgia who presented to PCP with chest pain with EKG concerning for STEMI.   Assessment & Plan    Chest pain with abnormal EKG: She had her left heart catheterization yesterday which shows blockage of a small marginal artery, EKG changes are consistent with that area.  Artery was too small to be intervened. Restarted chest pain 2-hour after stopping nitroglycerin this morning, which started improving with restarting of nitro and morphine. -Nitroglycerin infusion restarted.  We will wean her off from nitroglycerin with bridge with  long-acting nitro. -She was given morphine 2 mg. -Trend troponin. -Continue metoprolol 25 mg daily. -Continue Plavix. -Discontinue heparin. -Start Imdur 30 mg daily, nitroglycerin infusion will be discontinued after starting Imdur. -We will restart home dose of losartan once blood pressure permits.  HL: several intolerances to statins noted.  She was on pravastatin 20  mg daily at home. -Started her on Crestor 5 mg daily.  Hypertension.  She was on hyzaar 100/12.5.  At home. We will restart losartan once blood pressure allows.  Hypothyroidism: continue synthroid 113mg.  SRollene RotundaMD PGY2 05/09/2018, 9:21 AM

## 2018-05-09 NOTE — Progress Notes (Signed)
CP 6/10 with nausea and neck pain. Pt states that it is the same pain that she experienced yesterday prior to going to cath lab.   CP began 2 hours after NTG turned off. Resumed NTG. 4 mg zofran given IV also.  PA [facility] paged and returned call--Advised of Pt condition

## 2018-05-09 NOTE — Progress Notes (Signed)
[place] for heparin Indication: chest pain/ACS   Allergies  Allergen Reactions  . Livalo [Pitavastatin] Other (See Comments)    myalgias  . Aspirin Other (See Comments)    unknown  . Gadolinium Hives    patient unsure of which kind of dye she had a reaction to until reaction to Magnevist - may be allergic to x-ray contrast, Onset Date: [id number]   . Hydrocodone-Acetaminophen   . Iohexol Hives    patient unsure of which kind of dye she had a reaction to until reaction to Magnevist - may be allergic to x-ray contrast, Onset Date: [id number]  . Lipitor [Atorvastatin] Other (See Comments)    Myalgias  . Crestor [Rosuvastatin Calcium] Rash and Other (See Comments)    Myalgias    Patient Measurements: Height: 5' 5"  (165.1 cm) Weight: 191 lb 5.8 oz (86.8 kg) IBW/kg (Calculated) : 57 Heparin Dosing Weight: 79kg  Vital Signs: Temp: 97.7 F (36.5 C) (06/11 0349) Temp Source: Oral (06/11 0349) BP: 128/74 (06/11 0400) Pulse Rate: 66 (06/11 0400)  Labs: Recent Labs    05/08/18 1010 05/08/18 1155 05/08/18 1431 05/09/18 0317  HGB  --  14.0  --  12.5  HCT  --  40.8  --  36.9  PLT  --  350  --  352  APTT  --  31  --   --   LABPROT  --  13.2  --   --   INR  --  1.01  --   --   HEPARINUNFRC  --   --   --  0.53  CREATININE 0.77 0.89  --  0.89  TROPONINI  --  1.29* 0.92*  --     Estimated Creatinine Clearance: 67.6 mL/min (by C-G formula based on SCr of 0.89 mg/dL).  Assessment: 67 y.o. female with chest pain s/p cath for heparin  Goal of Therapy:  Heparin level 0.3-0.7 units/ml Monitor platelets by anticoagulation protocol: Yes   Plan:  Continue Heparin at current rate   Phillis Knack, PharmD, BCPS  05/09/2018 4:18 AM

## 2018-05-09 NOTE — Progress Notes (Signed)
CARDIAC REHAB PHASE I   PRE:  Rate/Rhythm: 71 SR    BP: sitting 98/66    SaO2:   MODE:  Ambulation: 190 ft   POST:  Rate/Rhythm: 82 SR    BP: sitting 98/63     SaO2:   Pt c/o stomach pain but felt better after walking. No c/o CP or dizziness. To recliner. Began ed with daughter present. Will refer to [place].  [place], ACSM 05/09/2018 2:19 PM

## 2018-05-09 NOTE — Progress Notes (Signed)
Introduced myself to [name] this morning while rounding the floor.  She is very pleasant to talk to and shares with me about her family that visited yesterday filling up the room.  She says she hadn't had a heart attack before and is grateful she has no major blockages.  She also shares about the many losses her family has had over the last two years, her husband and her mother and several other family members.  All in all her faith in God is what she attributes to her being able to get through it all.  Very pleasant.  Thankful for [name] and the medical professionals taking care of her.    05/09/18 1044  Clinical Encounter Type  Visited With Patient  Visit Type Initial;Spiritual support

## 2018-05-10 ENCOUNTER — Inpatient Hospital Stay (HOSPITAL_COMMUNITY): Payer: Medicare Other

## 2018-05-10 DIAGNOSIS — R079 Chest pain, unspecified: Secondary | ICD-10-CM

## 2018-05-10 LAB — CBC
HEMATOCRIT: 34.3 % — AB (ref 36.0–46.0)
HEMOGLOBIN: 11.2 g/dL — AB (ref 12.0–15.0)
MCH: 26.9 pg (ref 26.0–34.0)
MCHC: 32.7 g/dL (ref 30.0–36.0)
MCV: 82.5 fL (ref 78.0–100.0)
Platelets: 306 10*3/uL (ref 150–400)
RBC: 4.16 MIL/uL (ref 3.87–5.11)
RDW: 13.8 % (ref 11.5–15.5)
WBC: 13.6 10*3/uL — AB (ref 4.0–10.5)

## 2018-05-10 LAB — BASIC METABOLIC PANEL
Anion gap: 7 (ref 5–15)
BUN: 7 mg/dL (ref 6–20)
CHLORIDE: 107 mmol/L (ref 101–111)
CO2: 27 mmol/L (ref 22–32)
Calcium: 8.5 mg/dL — ABNORMAL LOW (ref 8.9–10.3)
Creatinine, Ser: 0.9 mg/dL (ref 0.44–1.00)
GFR calc non Af Amer: >60 mL/min (ref >60)
GLUCOSE: 115 mg/dL — AB (ref 65–99)
POTASSIUM: 4 mmol/L (ref 3.5–5.1)
Sodium: 141 mmol/L (ref 135–145)

## 2018-05-10 LAB — ECHOCARDIOGRAM COMPLETE
Height: 65 in
Weight: 3061.75 oz

## 2018-05-10 LAB — HEPARIN LEVEL (UNFRACTIONATED)

## 2018-05-10 MED ORDER — LORATADINE 10 MG PO TABS
10.0000 mg | ORAL_TABLET | Freq: Every day | ORAL | Status: DC
  Administered 2018-05-10 – 2018-05-11 (×2): 10 mg via ORAL
  Filled 2018-05-10 (×2): qty 1

## 2018-05-10 MED ORDER — FLUTICASONE PROPIONATE 50 MCG/ACT NA SUSP
1.0000 | Freq: Every day | NASAL | Status: DC
  Administered 2018-05-11: 1 via NASAL
  Filled 2018-05-10: qty 16

## 2018-05-10 MED ORDER — ACETAMINOPHEN 325 MG PO TABS
650.0000 mg | ORAL_TABLET | Freq: Four times a day (QID) | ORAL | Status: DC | PRN
  Administered 2018-05-10: 650 mg via ORAL
  Filled 2018-05-10: qty 2

## 2018-05-10 MED ORDER — BENZONATATE 100 MG PO CAPS
100.0000 mg | ORAL_CAPSULE | Freq: Three times a day (TID) | ORAL | Status: DC | PRN
  Administered 2018-05-10: 100 mg via ORAL
  Filled 2018-05-10: qty 1

## 2018-05-10 NOTE — Progress Notes (Signed)
CARDIAC REHAB PHASE I   PRE:  Rate/Rhythm: 70 SR    BP: sitting 115/80    SaO2:   MODE:  Ambulation: 790 ft   POST:  Rate/Rhythm: 82 SR    BP: sitting 122/59     SaO2:   Pt ambulated fairly quickly, asked to go second round. However had slight chest tightness after walk for 2-3 min. Resolved with rest. Ed completed with pt and daughter, good reception. Her A1C is in preDM range so discussed. She drinks a significant amount of sugared drinks. Encouraged increased exercise. She plans to do CRPII. Understands NTG and Plavix. [place], ACSM 05/10/2018 10:54 AM

## 2018-05-10 NOTE — Progress Notes (Signed)
  Echocardiogram 2D Echocardiogram has been performed.  [name] 05/10/2018, 4:02 PM

## 2018-05-10 NOTE — Progress Notes (Addendum)
The patient has been seen in conjunction with Rosiland Oz, MD. All aspects of care have been considered and discussed. The patient has been personally interviewed, examined, and all clinical data has been reviewed.   No recurrence of ischemic quality chest pain.  Has been able to ambulate without difficulty.  No evolutionary changes on EKG  We will recommend transfer to telemetry and if ambulates well consider discharge in a.m.   Progress Note  Patient Name: [name] Date of Encounter: 05/10/2018  Primary Cardiologist: [name].  Subjective   Patient was feeling much better when seen this morning.  Denies any more chest pain or shortness of breath.  She was complaining of dry cough and mild nasal congestion since last night, remained afebrile.  She was asking for some cough suppressant.  Inpatient Medications    Scheduled Meds: . clopidogrel  75 mg Oral Q breakfast  . isosorbide mononitrate  30 mg Oral Daily  . levothyroxine  125 mcg Oral QAC breakfast  . metoprolol succinate  25 mg Oral Daily  . rosuvastatin  5 mg Oral q1800  . sodium chloride flush  3 mL Intravenous Q12H   Continuous Infusions: . sodium chloride Stopped (05/09/18 2000)  . sodium chloride     PRN Meds: sodium chloride, nitroGLYCERIN, ondansetron (ZOFRAN) IV, sodium chloride flush   Vital Signs    Vitals:   05/10/18 0400 05/10/18 0500 05/10/18 0600 05/10/18 0736  BP: (!) 85/47 (!) 101/53 (!) 112/56   Pulse: 65 70 60   Resp: 14 13 13    Temp:    98 F (36.7 C)  TempSrc:    Oral  SpO2: 98% 94% 97%   Weight:      Height:        Intake/Output Summary (Last 24 hours) at 05/10/2018 0857 Last data filed at 05/09/2018 2200 Gross per 24 hour  Intake 659.33 ml  Output -  Net 659.33 ml    I/O since admission:   Filed Weights   05/08/18 1342  Weight: 191 lb 5.8 oz (86.8 kg)    Telemetry     - Personally Reviewed.  Remained in sinus rhythm with few PVCs.  ECG    ECG (independently read  by me): Left axis with T wave inversion in aVL, aVR, V1 and V2, which seems little improved as compared to yesterday.  Physical Exam   BP (!) 112/56   Pulse 60   Temp 98 F (36.7 C) (Oral)   Resp 13   Ht 5' 5"  (1.651 m)   Wt 191 lb 5.8 oz (86.8 kg)   SpO2 97%   BMI 31.84 kg/m  General: Alert, oriented, no distress.  Skin: normal turgor, no rashes, warm and dry HEENT: Normocephalic, atraumatic.Neck: No JVD, no carotid bruits; normal carotid upstroke Lungs: clear to ausculatation and percussion; no wheezing or rales Chest wall: without tenderness to palpitation Heart: PMI not displaced, RRR, s1 s2 normal, no murmur, no rubs, gallops, thrills, or heaves Abdomen: soft, nontender; no hepatosplenomehaly, BS+; abdominal aorta nontender and not dilated by palpation. Pulses 2+ Extremities: no clubbing cyanosis or edema, Homan's sign negative  Neurologic: grossly nonfocal; Cranial nerves grossly wnl Psychologic: Normal mood and affect     Labs    Chemistry Recent Labs  Lab 05/08/18 1155 05/09/18 0317 05/10/18 0207  NA 140 140 141  K 4.0 4.2 4.0  CL 99* 106 107  CO2 27 26 27   GLUCOSE 120* 183* 115*  BUN 6 6 7   CREATININE  0.89 0.89 0.90  CALCIUM 9.2 9.0 8.5*  PROT 7.4  --   --   ALBUMIN 3.8  --   --   AST 49*  --   --   ALT 10*  --   --   ALKPHOS 77  --   --   BILITOT 3.2*  --   --   GFRNONAA >60 >60 >60  GFRAA >60 >60 >60  ANIONGAP 14 8 7      Hematology Recent Labs  Lab 05/08/18 1155 05/09/18 0317 05/10/18 0207  WBC 12.9* 12.3* 13.6*  RBC 5.18* 4.61 4.16  HGB 14.0 12.5 11.2*  HCT 40.8 36.9 34.3*  MCV 78.8 80.0 82.5  MCH 27.0 27.1 26.9  MCHC 34.3 33.9 32.7  RDW 13.2 13.4 13.8  PLT 350 352 306    Cardiac Enzymes Recent Labs  Lab 05/08/18 1431 05/09/18 0942 05/09/18 1436 05/09/18 2124  TROPONINI 0.92* 0.46* 0.45* 0.47*   No results for input(s): TROPIPOC in the last 168 hours.   BNPNo results for input(s): BNP, PROBNP in the last 168 hours.    DDimer No results for input(s): DDIMER in the last 168 hours.   Lipid Panel     Component Value Date/Time   CHOL 200 05/08/2018 1155   TRIG 148 05/08/2018 1155   HDL 52 05/08/2018 1155   CHOLHDL 3.8 05/08/2018 1155   VLDL 30 05/08/2018 1155   LDLCALC 118 (H) 05/08/2018 1155   LDLDIRECT 88.0 10/19/2016 0844    Radiology    Dg Chest 2 View  Result Date: 05/08/2018 CLINICAL DATA:  Upper posterior chest pain EXAM: CHEST - 2 VIEW COMPARISON:  10/19/2016 FINDINGS: Left base atelectasis. Heart is normal size. Right lung clear. No effusions or acute bony abnormality. IMPRESSION: Left base atelectasis. Electronically Signed   By: Rolm Baptise M.D.   On: 05/08/2018 10:00   Dg Chest Portable 1 View  Result Date: 05/08/2018 CLINICAL DATA:  STEMI EXAM: PORTABLE CHEST 1 VIEW COMPARISON:  05/08/2018 FINDINGS: Cardiac shadow is stable. The lungs are well aerated bilaterally. Previously seen left basilar atelectasis has resolved. No acute bony abnormality is seen. IMPRESSION: No acute abnormality noted. Electronically Signed   By: Inez Catalina M.D.   On: 05/08/2018 12:23    Cardiac Studies   Urgent left cardiac catheterization. Conclusion    Acute coronary syndrome with anterior wall motion abnormality and essentially widely patent coronary arteries. Suspect variant stress cardiomyopathy. (Cannot totally exclude transient occlusion and recanalization of the first diagonal which is relatively small and has moderate somewhat hazy disease in the mid segment up to 70%.)  Normal left main  LAD is large, wraps around the left ventricular apex, and gives origin to 3 diagonal branches, the second of which is discussed above with mid vessel 70% narrowing in an artery that is less than 2 mm in diameter. Beyond that there is either a small branch or occlusion of the branch. Images are ambiguous.  Circumflex is large, first marginal and second marginal widely patent. Second marginal  bifurcates.  Right coronary is tortuous but widely patent.  Abnormal left ventricular wall motion with mid to distal anterior wall akinesis, EF 45%, and elevated LVEDP consistent with acute combined systolic and diastolic heart failure.  RECOMMENDATIONS:   Risk factor modification: Blood pressure 130/80 mmHg or less, LDL cholesterol less than 70, check A1c, and encourage exercise when appropriate.  Add low-dose beta-blocker  IV nitroglycerin and IV heparin x24 hours then wean.  2D Doppler echocardiogram tomorrow.  Patient Profile     67 y.o. female with PMH of HTN, HL, hypothyroidism, GERD and fibromyalgia who presented to PCP with chest pain with EKG concerning for STEMI.   Assessment & Plan    Chest pain with abnormal EKG: She had her left heart catheterization yesterday which shows blockage of a small marginal artery, EKG changes are consistent with that area.  Artery was too small to be intervened. Chest pain resolved today, troponin remained stable around 0.47. EKG shows some improvement. -Echo still pending. -Continue metoprolol and Plavix. -Continue Imdur. -She will need restart of her home dose of losartan once blood pressure permits.  URI.  Most likely she is having upper respiratory viral infection.  Remained afebrile, denies any malaise or generalized body aches. -Flonase. -Allegra -Tessalon pearls  [id number] intolerances to statins noted.  She was on pravastatin 20 mg daily at home. -Started her on Crestor 5 mg daily.  Hypertension.  She was on hyzaar 100/12.5.  At home. We will restart losartan once blood pressure allows.  Hypothyroidism: continue synthroid 131mg.  Signed, SLorella Nimrod MD PGY2 05/10/2018, 8:57 AM

## 2018-05-11 DIAGNOSIS — I5181 Takotsubo syndrome: Principal | ICD-10-CM

## 2018-05-11 DIAGNOSIS — R079 Chest pain, unspecified: Secondary | ICD-10-CM

## 2018-05-11 LAB — BASIC METABOLIC PANEL
Anion gap: 6 (ref 5–15)
BUN: 10 mg/dL (ref 6–20)
CO2: 28 mmol/L (ref 22–32)
Calcium: 8.4 mg/dL — ABNORMAL LOW (ref 8.9–10.3)
Chloride: 107 mmol/L (ref 101–111)
Creatinine, Ser: 0.88 mg/dL (ref 0.44–1.00)
GFR calc Af Amer: >60 mL/min (ref >60)
GLUCOSE: 100 mg/dL — AB (ref 65–99)
POTASSIUM: 3.8 mmol/L (ref 3.5–5.1)
SODIUM: 141 mmol/L (ref 135–145)

## 2018-05-11 MED ORDER — METOPROLOL SUCCINATE ER 25 MG PO TB24: 25.0000 mg | ORAL_TABLET | Freq: Every day | ORAL | 3 refills | Status: DC

## 2018-05-11 MED ORDER — BENZONATATE 100 MG PO CAPS: 100.0000 mg | ORAL_CAPSULE | Freq: Three times a day (TID) | ORAL | 0 refills | Status: DC | PRN

## 2018-05-11 MED ORDER — LOSARTAN POTASSIUM 25 MG PO TABS: 25.0000 mg | ORAL_TABLET | Freq: Every day | ORAL | 3 refills | Status: DC

## 2018-05-11 MED ORDER — NITROGLYCERIN 0.4 MG SL SUBL: 0.4000 mg | SUBLINGUAL_TABLET | SUBLINGUAL | 1 refills | Status: DC | PRN

## 2018-05-11 MED ORDER — LORATADINE 10 MG PO TABS: 10.0000 mg | ORAL_TABLET | Freq: Every day | ORAL | 3 refills | Status: DC

## 2018-05-11 MED ORDER — ISOSORBIDE MONONITRATE ER 30 MG PO TB24: 30.0000 mg | ORAL_TABLET | Freq: Every day | ORAL | 3 refills | Status: DC

## 2018-05-11 MED ORDER — LOSARTAN POTASSIUM 25 MG PO TABS
25.0000 mg | ORAL_TABLET | Freq: Every day | ORAL | Status: DC
  Administered 2018-05-11: 25 mg via ORAL
  Filled 2018-05-11: qty 1

## 2018-05-11 MED ORDER — CLOPIDOGREL BISULFATE 75 MG PO TABS: 75.0000 mg | ORAL_TABLET | Freq: Every day | ORAL | 3 refills | Status: DC

## 2018-05-11 MED ORDER — FLUTICASONE PROPIONATE 50 MCG/ACT NA SUSP: 1.0000 | Freq: Every day | NASAL | 2 refills | Status: DC

## 2018-05-11 MED ORDER — ROSUVASTATIN CALCIUM 5 MG PO TABS: 5.0000 mg | ORAL_TABLET | Freq: Every day | ORAL | 3 refills | Status: DC

## 2018-05-11 NOTE — Discharge Summary (Addendum)
The patient has been seen in conjunction with Fabian Sharp, PA-C. All aspects of care have been considered and discussed. The patient has been personally interviewed, examined, and all clinical data has been reviewed.   Stress cardiomyopathy  Probable URI  Essential hypertension, controlled.  Needs 1 to 2-week transition of care follow-up.  Discharge Summary    Patient ID: [name],  MRN: [id number], DOB/AGE: 1951-01-30 67 y.o.  Admit date: 05/08/2018 Discharge date: 05/11/2018  Primary Care Provider: [name] Primary Cardiologist: [name], MD  Discharge Diagnoses    Principal Problem:   Chest pain in adult Active Problems:   Hyperlipidemia with target LDL less than 100   Essential hypertension   DJD (degenerative joint disease) of knee   HYPOKALEMIA, MILD   Acute coronary syndrome [facility])   ACS (acute coronary syndrome) (HCC)   Allergies Allergies  Allergen Reactions  . Livalo [Pitavastatin] Other (See Comments)    myalgias  . Aspirin Other (See Comments)    unknown  . Gadolinium Hives    patient unsure of which kind of dye she had a reaction to until reaction to Magnevist - may be allergic to x-ray contrast, Onset Date: [id number]   . Hydrocodone-Acetaminophen   . Iohexol Hives    patient unsure of which kind of dye she had a reaction to until reaction to Magnevist - may be allergic to x-ray contrast, Onset Date: [id number]  . Lipitor [Atorvastatin] Other (See Comments)    Myalgias  . Crestor [Rosuvastatin Calcium] Rash and Other (See Comments)    Myalgias    Diagnostic Studies/Procedures    Urgent left cardiac catheterization 05/08/18 Conclusion    Acute coronary syndrome with anterior wall motion abnormality and essentially widely patent coronary arteries. Suspect variant stress cardiomyopathy. (Cannot totally exclude transient occlusion and recanalization of the first diagonal which is relatively small and has moderate somewhat hazy  disease in the mid segment up to 70%.)  Normal left main  LAD is large, wraps around the left ventricular apex, and gives origin to 3 diagonal branches, the second of which is discussed above with mid vessel 70% narrowing in an artery that is less than 2 mm in diameter. Beyond that there is either a small branch or occlusion of the branch. Images are ambiguous.  Circumflex is large, first marginal and second marginal widely patent. Second marginal bifurcates.  Right coronary is tortuous but widely patent.  Abnormal left ventricular wall motion with mid to distal anterior wall akinesis, EF 45%, and elevated LVEDP consistent with acute combined systolic and diastolic heart failure.  RECOMMENDATIONS:   Risk factor modification: Blood pressure 130/80 mmHg or less, LDL cholesterol less than 70, check A1c, and encourage exercise when appropriate.  Add low-dose beta-blocker  IV nitroglycerin and IV heparin x24 hours then wean.  2D Doppler echocardiogram tomorrow.    ECHO 05/10/18 Study Conclusions - Left ventricle: The cavity size was normal. There was mild concentric hypertrophy. Systolic function was mildly reduced. The estimated ejection fraction was in the range of 45% to 50%. Hypokinesis of the apicalanterior and apical myocardium; consistent with ischemia in the distribution of the left anterior descending coronary artery. Doppler parameters are consistent with abnormal left ventricular relaxation (grade 1 diastolic dysfunction).    History of Present Illness     [name] is a 67 yo female with PMH of HTN, HL, hypothyroidism, GERD and fibromyalgia. Reported having a cath done many years ago by Dr. Doylene Canard which she thinks was  normal. Was seen back in 2013 with Dr. [name] for chest pain and underwent a dobutamine stress echo. Has been followed by her PCP since that time. Reports having a milkshake last evening 05/07/18 around 6pm and then developed onset of  chest pressure radiating through to her back. Felt nauseated and vomited. Chest pain continued through the evening and she reports was pretty intense. Eventually eased. This morning on 05/08/18, symptoms returned and worsened. She had an appt with her PCP and went to this with her symptoms. EKG done at the office showed ST depression in lead II, III and elevation in v2. Trop drawn in the office noted at 1.65. EMS called and Code STEMI called in the field. She was seen by Dr. [name] in the ED given 4000 units of heparin, 2 SL nitro, and 23m morphine. Brought to the cath lab for emergent cardiac cath. Of note, reports an allergy to ASA of hives. Therefore did not receive in the field. this was the anniversary of her husband's death.  Hospital Course     Consultants:   Code STEMI was called in route with EMS. Dr. STamala Julianevaluated her in the ER and found new anterior T wave abnormality. She had a known positive troponin from PCP office and was taken to the cath lab.   Acute coronary syndrome with anterior wall motion abnormality with widely patent coronaries with the exception of 70% stenosis in the first diagonal. Area was too small for intervention. LAD was widely patent.  Suspicion for stress cardiomyopathy. Echocardiogram showed LVEF 45-50% with hypokinesis of the apicalanterior and apical myocardium consistent with LAD ischemia. Risk factor modification was recommended. Heparin and nitro drips were continued for 24 hrs following heart cath procedure. Troponin 1.29 --> 0.92 --> 0.46 --> 0.45 --> 0.47. She tolerated heart cath procedure well. Right radial access site C/D/I. Her chest pain resolved and she ambulated in the halls. Continue lopressor and plavix. Continue imdur and home losartan.   URI Suspect upper respiratory viral infection. Afebrile, WBC 13. Treatment focused on symptomatic relief. Tessalon pearls on discharge.   Hyperlipidemia with LDL goal less than 70 Several statin intolerances. Was on  pravastatin 20 mg at home. Switched to crestor 5 mg prior to discharge. Up-titrate at outpatient visit if she is tolerating crestor.  05/08/2018: Cholesterol 200; HDL 52; LDL Cholesterol 118; Triglycerides 148; VLDL 30  Essential Hypertension Continue lopressor, imdur, and losartan.   Hypothyroidism Continue home synthroid.   _____________  Discharge Vitals Blood pressure 137/82, pulse 67, temperature 98.3 F (36.8 C), temperature source Oral, resp. rate 20, height 5' 5"  (1.651 m), weight 191 lb 5.8 oz (86.8 kg), SpO2 94 %.  Filed Weights   05/08/18 1342  Weight: 191 lb 5.8 oz (86.8 kg)    Labs & Radiologic Studies    CBC Recent Labs    05/09/18 0317 05/10/18 0207  WBC 12.3* 13.6*  HGB 12.5 11.2*  HCT 36.9 34.3*  MCV 80.0 82.5  PLT 352 3951  Basic Metabolic Panel Recent Labs    05/10/18 0207 05/11/18 0255  NA 141 141  K 4.0 3.8  CL 107 107  CO2 27 28  GLUCOSE 115* 100*  BUN 7 10  CREATININE 0.90 0.88  CALCIUM 8.5* 8.4*   Liver Function Tests No results for input(s): AST, ALT, ALKPHOS, BILITOT, PROT, ALBUMIN in the last 72 hours. No results for input(s): LIPASE, AMYLASE in the last 72 hours. Cardiac Enzymes Recent Labs    05/09/18 0942 05/09/18 1436  05/09/18 2124  TROPONINI 0.46* 0.45* 0.47*   BNP Invalid input(s): POCBNP D-Dimer No results for input(s): DDIMER in the last 72 hours. Hemoglobin A1C No results for input(s): HGBA1C in the last 72 hours. Fasting Lipid Panel No results for input(s): CHOL, HDL, LDLCALC, TRIG, CHOLHDL, LDLDIRECT in the last 72 hours. Thyroid Function Tests No results for input(s): TSH, T4TOTAL, T3FREE, THYROIDAB in the last 72 hours.  Invalid input(s): FREET3 _____________  Dg Chest 2 View  Result Date: 05/08/2018 CLINICAL DATA:  Upper posterior chest pain EXAM: CHEST - 2 VIEW COMPARISON:  10/19/2016 FINDINGS: Left base atelectasis. Heart is normal size. Right lung clear. No effusions or acute bony abnormality.  IMPRESSION: Left base atelectasis. Electronically Signed   By: Rolm Baptise M.D.   On: 05/08/2018 10:00   Dg Chest Portable 1 View  Result Date: 05/08/2018 CLINICAL DATA:  STEMI EXAM: PORTABLE CHEST 1 VIEW COMPARISON:  05/08/2018 FINDINGS: Cardiac shadow is stable. The lungs are well aerated bilaterally. Previously seen left basilar atelectasis has resolved. No acute bony abnormality is seen. IMPRESSION: No acute abnormality noted. Electronically Signed   By: Inez Catalina M.D.   On: 05/08/2018 12:23   Disposition   Pt is being discharged home today in good condition.  Follow-up Plans & Appointments    Follow-up Information    Daune Perch, NP Follow up on 05/16/2018.   Specialty:  Nurse Practitioner Why:  11:00 am for TCM Contact information: [place] [address] [phone]          Discharge Instructions    Amb Referral to Cardiac Rehabilitation   Complete by:  As directed    Diagnosis:  STEMI   Diet - low sodium heart healthy   Complete by:  As directed    Discharge instructions   Complete by:  As directed    No driving for 1 week. No lifting over 5 lbs for 1 week. No sexual activity for 1 week. You may return to work in 1 week. Keep procedure site clean & dry. If you notice increased pain, swelling, bleeding or pus, call/return!  You may shower, but no soaking baths/hot tubs/pools for 1 week.   Increase activity slowly   Complete by:  As directed       Discharge Medications   Allergies as of 05/11/2018      Reactions   Livalo [pitavastatin] Other (See Comments)   myalgias   Aspirin Other (See Comments)   unknown   Gadolinium Hives   patient unsure of which kind of dye she had a reaction to until reaction to Magnevist - may be allergic to x-ray contrast, Onset Date: [id number]   Hydrocodone-acetaminophen    Iohexol Hives   patient unsure of which kind of dye she had a reaction to until reaction to Magnevist - may be allergic to x-ray  contrast, Onset Date: [id number]   Lipitor [atorvastatin] Other (See Comments)   Myalgias   Crestor [rosuvastatin Calcium] Rash, Other (See Comments)   Myalgias      Medication List    STOP taking these medications   losartan-hydrochlorothiazide 100-12.5 MG tablet Commonly known as:  HYZAAR   potassium chloride SA 20 MEQ tablet Commonly known as:  K-DUR,KLOR-CON   pravastatin 20 MG tablet Commonly known as:  PRAVACHOL     TAKE these medications   benzonatate 100 MG capsule Commonly known as:  TESSALON Take 1 capsule (100 mg total) by mouth 3 (three) times daily as needed for  cough.   clopidogrel 75 MG tablet Commonly known as:  PLAVIX Take 1 tablet (75 mg total) by mouth daily with breakfast. Start taking on:  05/12/2018   fluticasone 50 MCG/ACT nasal spray Commonly known as:  FLONASE Place 1 spray into both nostrils daily. Start taking on:  05/12/2018   isosorbide mononitrate 30 MG 24 hr tablet Commonly known as:  IMDUR Take 1 tablet (30 mg total) by mouth daily. Start taking on:  05/12/2018   levothyroxine 125 MCG tablet Commonly known as:  SYNTHROID, LEVOTHROID Take 1 tablet (125 mcg total) by mouth daily before breakfast.   loratadine 10 MG tablet Commonly known as:  CLARITIN Take 1 tablet (10 mg total) by mouth daily. Start taking on:  05/12/2018   losartan 25 MG tablet Commonly known as:  COZAAR Take 1 tablet (25 mg total) by mouth daily. Start taking on:  05/12/2018   metoprolol succinate 25 MG 24 hr tablet Commonly known as:  TOPROL-XL Take 1 tablet (25 mg total) by mouth daily. Start taking on:  05/12/2018   nitroGLYCERIN 0.4 MG SL tablet Commonly known as:  NITROSTAT Place 1 tablet (0.4 mg total) under the tongue every 5 (five) minutes as needed for chest pain.   RESTORA Caps Take 1 capsule by mouth daily.   rosuvastatin 5 MG tablet Commonly known as:  CRESTOR Take 1 tablet (5 mg total) by mouth daily at 6 PM.   Vitamin D (Ergocalciferol) 50000  units Caps capsule Commonly known as:  DRISDOL TAKE 1 CAPSULE (50,000 UNITS TOTAL) BY MOUTH EVERY 7 (SEVEN) DAYS.        Acute coronary syndrome (MI, NSTEMI, STEMI, etc) this admission?: Yes.     AHA/ACC Clinical Performance & Quality Measures: 5. Aspirin prescribed? - Yes 6. ADP Receptor Inhibitor (Plavix/Clopidogrel, Brilinta/Ticagrelor or Effient/Prasugrel) prescribed (includes medically managed patients)? - Yes 7. Beta Blocker prescribed? - Yes 8. High Intensity Statin (Lipitor 40-46m or Crestor 20-466m prescribed? - Yes 9. EF assessed during THIS hospitalization? - Yes 10. For EF <40%, was ACEI/ARB prescribed? - Yes 11. For EF <40%, Aldosterone Antagonist (Spironolactone or Eplerenone) prescribed? - No - Reason:  marginal pressure 12. Cardiac Rehab Phase II ordered (Included Medically managed Patients)? - Yes     Outstanding Labs/Studies   CBC for white count  Fasting lipids and LFTs in 6 weeks   Duration of Discharge Encounter   Greater than 30 minutes including physician time.  Signed, [place]/13/2019, 1:37 PM

## 2018-05-11 NOTE — Progress Notes (Signed)
CARDIAC REHAB PHASE I   PRE:  Rate/Rhythm: 72 SR    BP: sitting 123/66    SaO2:   MODE:  Ambulation: 1150 ft   POST:  Rate/Rhythm: 107 ST    BP: sitting 167/86     SaO2:   Pt feeling well except sts she feels she has a chest cold, that her chest feels tight when she moves around. Able to walk quickly around unit x3. Toward end of the walk, pt c/o right back pain, under shoulder blade. Sts it is an ache. Seemed to be getting better with rest. Reinforced ed and especially sugared drinks. [place], ACSM 05/11/2018 8:49 AM

## 2018-05-11 NOTE — Progress Notes (Addendum)
The patient has been seen in conjunction with Lorella Nimrod, MD. All aspects of care have been considered and discussed. The patient has been personally interviewed, examined, and all clinical data has been reviewed.   Clinical diagnosis is STRESS CARDIOMYOPATHY.  Echo demonstrates anteroapical and apical hypokinesis with EF of 45%.  Acute combined systolic and diastolic heart failure: Therapy with beta-blocker and long-acting nitrates due to mild obstructive disease in the second diagonal.  We will add low-dose losartan 25 mg daily to metoprolol to facilitate LV recovery.  Statin therapy.  Previous history of intolerance so we will use low-dose.  Transition of care follow-up 1 week with basic metabolic panel.  If low BP, discontinue long-acting nitrate therapy.   Progress Note  Patient Name: [name] DOBOSZ Date of Encounter: 05/11/2018  Primary Cardiologist: Or[place]   Patient was feeling better this morning.  She was able to walk in the halls unit without any difficulty.  She did had mild chest tightness during walk yesterday, denies any chest tightness or pain or shortness of breath with walking today.  Wants to go home.  Respiratory symptoms are improving.  Inpatient Medications    Scheduled Meds: . clopidogrel  75 mg Oral Q breakfast  . fluticasone  1 spray Each Nare Daily  . isosorbide mononitrate  30 mg Oral Daily  . levothyroxine  125 mcg Oral QAC breakfast  . loratadine  10 mg Oral Daily  . metoprolol succinate  25 mg Oral Daily  . rosuvastatin  5 mg Oral q1800  . sodium chloride flush  3 mL Intravenous Q12H   Continuous Infusions: . sodium chloride Stopped (05/09/18 2000)  . sodium chloride     PRN Meds: sodium chloride, acetaminophen, benzonatate, nitroGLYCERIN, ondansetron (ZOFRAN) IV, sodium chloride flush   Vital Signs    Vitals:   05/11/18 0600 05/11/18 0653 05/11/18 0700 05/11/18 0747  BP:  (!) 101/58    Pulse:      Resp: 15 18 20    Temp:     98.6 F (37 C)  TempSrc:    Oral  SpO2:      Weight:      Height:        Intake/Output Summary (Last 24 hours) at 05/11/2018 0846 Last data filed at 05/10/2018 1800 Gross per 24 hour  Intake 480 ml  Output -  Net 480 ml    I/O since admission:   Filed Weights   05/08/18 1342  Weight: 191 lb 5.8 oz (86.8 kg)    Telemetry     - Personally Reviewed.  Remained in sinus rhythm with few PVCs.  ECG    ECG (independently read by me): No EKG today.  Physical Exam   BP (!) 101/58   Pulse 67   Temp 98.6 F (37 C) (Oral)   Resp 20   Ht 5' 5"  (1.651 m)   Wt 191 lb 5.8 oz (86.8 kg)   SpO2 94%   BMI 31.84 kg/m  General: Alert, oriented, no distress.  Skin: normal turgor, no rashes, warm and dry HEENT: Normocephalic, atraumatic.  Neck: No JVD, no carotid bruits; normal carotid upstroke Lungs: clear to ausculatation and percussion; no wheezing or rales Chest wall: without tenderness to palpitation Heart: PMI not displaced, RRR, s1 s2 normal, 1/6 systolic murmur, no diastolic murmur, no rubs, gallops, thrills, or heaves Abdomen: soft, nontender; no hepatosplenomehaly, BS+; abdominal aorta nontender and not dilated by palpation. Pulses 2+ Extremities: no clubbing cyanosis or edema, Homan's sign  negative  Neurologic: grossly nonfocal; Cranial nerves grossly wnl Psychologic: Normal mood and affect  Labs    Chemistry Recent Labs  Lab 05/08/18 1155 05/09/18 0317 05/10/18 0207 05/11/18 0255  NA 140 140 141 141  K 4.0 4.2 4.0 3.8  CL 99* 106 107 107  CO2 27 26 27 28   GLUCOSE 120* 183* 115* 100*  BUN 6 6 7 10   CREATININE 0.89 0.89 0.90 0.88  CALCIUM 9.2 9.0 8.5* 8.4*  PROT 7.4  --   --   --   ALBUMIN 3.8  --   --   --   AST 49*  --   --   --   ALT 10*  --   --   --   ALKPHOS 77  --   --   --   BILITOT 3.2*  --   --   --   GFRNONAA >60 >60 >60 >60  GFRAA >60 >60 >60 >60  ANIONGAP 14 8 7 6      Hematology Recent Labs  Lab 05/08/18 1155 05/09/18 0317  05/10/18 0207  WBC 12.9* 12.3* 13.6*  RBC 5.18* 4.61 4.16  HGB 14.0 12.5 11.2*  HCT 40.8 36.9 34.3*  MCV 78.8 80.0 82.5  MCH 27.0 27.1 26.9  MCHC 34.3 33.9 32.7  RDW 13.2 13.4 13.8  PLT 350 352 306    Cardiac Enzymes Recent Labs  Lab 05/08/18 1431 05/09/18 0942 05/09/18 1436 05/09/18 2124  TROPONINI 0.92* 0.46* 0.45* 0.47*   No results for input(s): TROPIPOC in the last 168 hours.   BNPNo results for input(s): BNP, PROBNP in the last 168 hours.   DDimer No results for input(s): DDIMER in the last 168 hours.   Lipid Panel     Component Value Date/Time   CHOL 200 05/08/2018 1155   TRIG 148 05/08/2018 1155   HDL 52 05/08/2018 1155   CHOLHDL 3.8 05/08/2018 1155   VLDL 30 05/08/2018 1155   LDLCALC 118 (H) 05/08/2018 1155   LDLDIRECT 88.0 10/19/2016 0844    Radiology    No results found.  Cardiac Studies   Urgent left cardiac catheterization. Conclusion    Acute coronary syndrome with anterior wall motion abnormality and essentially widely patent coronary arteries. Suspect variant stress cardiomyopathy. (Cannot totally exclude transient occlusion and recanalization of the first diagonal which is relatively small and has moderate somewhat hazy disease in the mid segment up to 70%.)  Normal left main  LAD is large, wraps around the left ventricular apex, and gives origin to 3 diagonal branches, the second of which is discussed above with mid vessel 70% narrowing in an artery that is less than 2 mm in diameter. Beyond that there is either a small branch or occlusion of the branch. Images are ambiguous.  Circumflex is large, first marginal and second marginal widely patent. Second marginal bifurcates.  Right coronary is tortuous but widely patent.  Abnormal left ventricular wall motion with mid to distal anterior wall akinesis, EF 45%, and elevated LVEDP consistent with acute combined systolic and diastolic heart failure.  RECOMMENDATIONS:   Risk factor  modification: Blood pressure 130/80 mmHg or less, LDL cholesterol less than 70, check A1c, and encourage exercise when appropriate.  Add low-dose beta-blocker  IV nitroglycerin and IV heparin x24 hours then wean.  2D Doppler echocardiogram tomorrow.   ECHO. Study Conclusions  - Left ventricle: The cavity size was normal. There was mild   concentric hypertrophy. Systolic function was mildly reduced. The   estimated ejection  fraction was in the range of 45% to 50%.   Hypokinesis of the apicalanterior and apical myocardium;   consistent with ischemia in the distribution of the left anterior   descending coronary artery. Doppler parameters are consistent   with abnormal left ventricular relaxation (grade 1 diastolic   dysfunction).   Patient Profile     68 y.o. female with PMH of HTN, HL, hypothyroidism, GERD and fibromyalgia who presented to PCP with chest pain with EKG concerning for STEMI.  Assessment & Plan    Chest pain with abnormal EKG:She had her left heart catheterization which shows blockage of a small marginal artery,EKG changes are consistent with that area.Artery wastoo small to be intervened.  Echo done yesterday shows mild apical hypokinesia with mildly reduced ejection fraction. Chest pain resolved , troponin remained stable around 0.47. -Continue metoprolol and Plavix. -Continue Imdur. -She will need restart of her home dose of losartan once blood pressure permits. -Can be discharged home today.  URI.  Most likely she is having upper respiratory viral infection.  Remained afebrile, denies any malaise or generalized body aches.  Mild improvement in her symptoms, asking for a prescription for Claritin and Tessalon Perles on discharge. -Flonase. -Claritin -Tessalon pearls PRN  TK:[id number] intolerances to statins noted.She was on pravastatin 20 mg daily at home. -Started her on Crestor 5 mg daily.  Hypertension.She was on hyzaar 100/12.5.At  home. Currently normotensive to little softer blood pressure, maintained on metoprolol and Imdur. We will restart losartan once blood pressure allows.  Hypothyroidism: continue synthroid 175mg.  SRollene RotundaMD PGY2 05/11/2018, 8:46 AM

## 2018-05-12 ENCOUNTER — Ambulatory Visit: Payer: Medicare Other | Admitting: Family Medicine

## 2018-05-12 ENCOUNTER — Telehealth (HOSPITAL_COMMUNITY): Payer: Self-pay

## 2018-05-12 ENCOUNTER — Telehealth: Payer: Self-pay | Admitting: *Deleted

## 2018-05-12 NOTE — Telephone Encounter (Signed)
Pt was on TCM report admitted 05/08/18 for chest pain symptoms returned and worsened. She had an appt with her PCP and went to this with her symptoms. EKG done at the office showed ST depression in lead II, III and elevation in v2. Trop drawn in the office noted at 1.65. EMS called and Code STEMI called in the field. She was seen by Dr. [name] in the ED given 4000 units of heparin, 2 SL nitro, and 80m morphine. Brought to the cath lab for emergent cardiac cath. Pt was D/C 05/11/18 and will f/u w/cardiology HDaune Perch NP Follow up on 05/16/2018. Does not need TCM Hosp f/u w/PCP..Marland KitchenJohny Chess

## 2018-05-12 NOTE — Telephone Encounter (Signed)
Patients insurance is active and benefits verified through [facility] - $20.00 co-pay, no deductible, out of pocket amount of $6,700/$300.70 has been met, no co-insurance, and no pre-authorization is required. Passport/reference #[id number]  Will contact patient to see if she is interested in the Cardiac Rehab Program. If interested, patient will need to complete follow up appt. Once completed, patient will be contacted for scheduling upon review by the RN Navigator.

## 2018-05-13 ENCOUNTER — Other Ambulatory Visit: Payer: Self-pay

## 2018-05-13 ENCOUNTER — Emergency Department (HOSPITAL_COMMUNITY)
Admission: EM | Admit: 2018-05-13 | Discharge: 2018-05-13 | Disposition: A | Discharge status: Home / Self Care | Payer: Medicare Other | Attending: Emergency Medicine | Admitting: Emergency Medicine

## 2018-05-13 ENCOUNTER — Encounter (HOSPITAL_COMMUNITY): Payer: Self-pay

## 2018-05-13 ENCOUNTER — Emergency Department (HOSPITAL_COMMUNITY): Payer: Medicare Other

## 2018-05-13 DIAGNOSIS — I10 Essential (primary) hypertension: Secondary | ICD-10-CM | POA: Insufficient documentation

## 2018-05-13 DIAGNOSIS — R079 Chest pain, unspecified: Secondary | ICD-10-CM

## 2018-05-13 DIAGNOSIS — Z7902 Long term (current) use of antithrombotics/antiplatelets: Secondary | ICD-10-CM | POA: Insufficient documentation

## 2018-05-13 DIAGNOSIS — Z79899 Other long term (current) drug therapy: Secondary | ICD-10-CM | POA: Diagnosis not present

## 2018-05-13 DIAGNOSIS — R072 Precordial pain: Secondary | ICD-10-CM | POA: Diagnosis not present

## 2018-05-13 DIAGNOSIS — I213 ST elevation (STEMI) myocardial infarction of unspecified site: Secondary | ICD-10-CM | POA: Diagnosis not present

## 2018-05-13 DIAGNOSIS — R0602 Shortness of breath: Secondary | ICD-10-CM | POA: Diagnosis not present

## 2018-05-13 DIAGNOSIS — E039 Hypothyroidism, unspecified: Secondary | ICD-10-CM | POA: Insufficient documentation

## 2018-05-13 DIAGNOSIS — M5489 Other dorsalgia: Secondary | ICD-10-CM | POA: Insufficient documentation

## 2018-05-13 DIAGNOSIS — R9431 Abnormal electrocardiogram [ECG] [EKG]: Secondary | ICD-10-CM | POA: Diagnosis not present

## 2018-05-13 LAB — BASIC METABOLIC PANEL
ANION GAP: 8 (ref 5–15)
BUN: 7 mg/dL (ref 6–20)
CALCIUM: 9 mg/dL (ref 8.9–10.3)
CO2: 26 mmol/L (ref 22–32)
Chloride: 107 mmol/L (ref 101–111)
Creatinine, Ser: 0.8 mg/dL (ref 0.44–1.00)
Glucose, Bld: 101 mg/dL — ABNORMAL HIGH (ref 65–99)
Potassium: 4 mmol/L (ref 3.5–5.1)
SODIUM: 141 mmol/L (ref 135–145)

## 2018-05-13 LAB — CBC
HCT: 33.9 % — ABNORMAL LOW (ref 36.0–46.0)
HEMOGLOBIN: 11.5 g/dL — AB (ref 12.0–15.0)
MCH: 26.9 pg (ref 26.0–34.0)
MCHC: 33.9 g/dL (ref 30.0–36.0)
MCV: 79.4 fL (ref 78.0–100.0)
Platelets: 332 10*3/uL (ref 150–400)
RBC: 4.27 MIL/uL (ref 3.87–5.11)
RDW: 13.6 % (ref 11.5–15.5)
WBC: 9.8 10*3/uL (ref 4.0–10.5)

## 2018-05-13 LAB — I-STAT TROPONIN, ED
TROPONIN I, POC: 0.04 ng/mL (ref 0.00–0.08)
TROPONIN I, POC: 0.04 ng/mL (ref 0.00–0.08)

## 2018-05-13 MED ORDER — NITROGLYCERIN 0.4 MG SL SUBL
SUBLINGUAL_TABLET | SUBLINGUAL | Status: AC
  Administered 2018-05-13: 0.4 mg
  Filled 2018-05-13: qty 1

## 2018-05-13 MED ORDER — NITROGLYCERIN 0.4 MG SL SUBL
0.4000 mg | SUBLINGUAL_TABLET | SUBLINGUAL | Status: DC | PRN
Start: 2018-05-13 — End: 2018-05-13

## 2018-05-13 NOTE — ED Notes (Signed)
ED Provider at bedside. 

## 2018-05-13 NOTE — ED Notes (Signed)
Patient transported to X-ray 

## 2018-05-13 NOTE — ED Provider Notes (Signed)
[facility] DEPARTMENT Provider Note   CSN: [id number] Arrival date & time: 05/13/18  1146     History   Chief Complaint Chief Complaint  Patient presents with  . Chest Pain  . Back Pain    HPI [name] is a 67 y.o. female.  Patient is a 67 year old female with a history of hypertension, hyperlipidemia, recent cardiac catheterization done this week for STEMI with elevated troponins and chest pain which showed mostly patent coronaries except for one branch with 70% occlusion that was too small for intervention who was discharged 2 days ago and presenting today with recurrent chest pain.  Patient's echo did show some wall hypokinesis and EF of 45 to 50%.  Patient states that she had been fine since she got home until approximately 1 hour ago when she is speaking on the phone with her family member.  Patient states this morning she got up and took a shower and took a family member to the drugstore and was fine.  While she was sitting she developed a heaviness in the front of her chest that radiated into her upper back.  She had already taken all of her morning medications this morning including Plavix.  So after 5 to 10 minutes of pain she took one nitroglycerin with some improvement and then took a second.  After taking the nitroglycerin she also became diaphoretic but denies any nausea or vomiting.  Currently she states she just has some slight heaviness in the front part of her chest but states the back pain is resolved.  She denies any new cough, fever or congestion.  She has no abdominal pain or leg swelling.  She states on the phone she did not feel stressed when speaking with her family members.  The history is provided by the patient.  Chest Pain   This is a recurrent problem. The current episode started less than 1 hour ago. The problem occurs constantly. The problem has been rapidly improving. Associated with: started when talking on the phone to family  member. The pain is present in the substernal region. The pain is at a severity of 3/10. The pain is moderate. The quality of the pain is described as heavy. The pain radiates to the upper back. Duration of episode(s) is 30 minutes. Associated symptoms include back pain, diaphoresis and shortness of breath. Pertinent negatives include no cough, no irregular heartbeat, no leg pain, no lower extremity edema, no nausea, no sputum production and no vomiting. She has tried nitroglycerin for the symptoms. The treatment provided moderate relief.  Procedure history is positive for cardiac catheterization.  Back Pain   Associated symptoms include chest pain. Pertinent negatives include no leg pain.    Past Medical History:  Diagnosis Date  . Anemia   . Arthritis   . Cholelithiasis   . Chronic back pain   . Constipation   . Esophageal reflux   . Fibromyalgia   . Hyperlipidemia   . Hypertension   . Hypothyroid   . IBS (irritable bowel syndrome)   . Internal hemorrhoids without mention of complication   . Left leg pain   . Personal history of colonic polyps 05/06/2010   ADENOMATOUS POLYP    Patient Active Problem List   Diagnosis Date Noted  . Non-intractable vomiting with nausea 05/08/2018  . Acute coronary syndrome ([place]) 05/08/2018  . ACS (acute coronary syndrome) ([place]) 05/08/2018  . Vitamin D deficiency 11/11/2017  . Greater trochanteric bursitis of left hip  08/29/2017  . Degenerative disc disease, lumbar 08/29/2017  . Prediabetes 06/14/2016  . Obesity (BMI 30.0-34.9) 06/14/2016  . Routine general medical examination at a health care facility 12/31/2014  . HYPOKALEMIA, MILD 07/20/2013  . Osteopenia 07/20/2013  . Chest pain in adult 06/14/2012  . GERD (gastroesophageal reflux disease) 09/16/2011  . Generalized anxiety disorder 09/16/2011  . Constipation, slow transit 09/16/2011  . DJD (degenerative joint disease) of knee 06/25/2011  . Pure hyperglyceridemia 06/24/2011  .  Hyperlipidemia with target LDL less than 100 07/30/2009  . Hypothyroidism 04/25/2009  . Essential hypertension 04/25/2009  . FIBROMYALGIA 04/25/2009    Past Surgical History:  Procedure Laterality Date  . ABDOMINAL HYSTERECTOMY    . CERVICAL DISC SURGERY     PLATE IN NECK & BACK  . CHOLECYSTECTOMY    . COLONOSCOPY  09/27/2011   NORMAL   . LEFT HEART CATH AND CORONARY ANGIOGRAPHY N/A 05/08/2018   Procedure: LEFT HEART CATH AND CORONARY ANGIOGRAPHY;  Surgeon: Belva Crome, MD;  Location: [facility];  Service: Cardiovascular;  Laterality: N/A;  . LUMBAR [place]    . TONSILLECTOMY       OB History    Gravida  3   Para  2   Term      Preterm      AB  1   Living  2     SAB  1   TAB      Ectopic      Multiple      Live Births               Home Medications    Prior to Admission medications   Medication Sig Start Date End Date Taking? Authorizing Provider  benzonatate (TESSALON) 100 MG capsule Take 1 capsule (100 mg total) by mouth 3 (three) times daily as needed for cough. 05/11/18   Ledora Bottcher, PA  clopidogrel (PLAVIX) 75 MG tablet Take 1 tablet (75 mg total) by mouth daily with breakfast. 05/12/18   Duke, Tami Lin, PA  fluticasone (FLONASE) 50 MCG/ACT nasal spray Place 1 spray into both nostrils daily. 05/12/18   Duke, Tami Lin, PA  isosorbide mononitrate (IMDUR) 30 MG 24 hr tablet Take 1 tablet (30 mg total) by mouth daily. 05/12/18   Duke, Tami Lin, PA  levothyroxine (SYNTHROID, LEVOTHROID) 125 MCG tablet Take 1 tablet (125 mcg total) by mouth daily before breakfast. 01/11/18   [name], MD  loratadine (CLARITIN) 10 MG tablet Take 1 tablet (10 mg total) by mouth daily. 05/12/18   Duke, Tami Lin, PA  losartan (COZAAR) 25 MG tablet Take 1 tablet (25 mg total) by mouth daily. 05/12/18   Duke, Tami Lin, PA  metoprolol succinate (TOPROL-XL) 25 MG 24 hr tablet Take 1 tablet (25 mg total) by mouth daily. 05/12/18    Duke, Tami Lin, PA  nitroGLYCERIN (NITROSTAT) 0.4 MG SL tablet Place 1 tablet (0.4 mg total) under the tongue every 5 (five) minutes as needed for chest pain. 05/11/18   Duke, Tami Lin, PA  Probiotic Product (RESTORA) CAPS Take 1 capsule by mouth daily. 01/11/18   [name], MD  rosuvastatin (CRESTOR) 5 MG tablet Take 1 tablet (5 mg total) by mouth daily at 6 PM. 05/11/18   Duke, Tami Lin, PA  Vitamin D, Ergocalciferol, (DRISDOL) 50000 units CAPS capsule TAKE 1 CAPSULE (50,000 UNITS TOTAL) BY MOUTH EVERY 7 (SEVEN) DAYS. 04/28/18   Lyndal Pulley, DO    Family History  Family History  Problem Relation Age of Onset  . Diabetes Mother        aunts and uncles  . Kidney disease Mother        stage 3  . Lung disease Mother        colapsed lung/in hospice  . COPD Mother   . Hypertension Mother   . Dementia Mother   . Breast cancer Maternal Aunt   . Heart failure Father        Mother  . Breast cancer Cousin   . Colon cancer Neg Hx     Social History Social History   Tobacco Use  . Smoking status: Never Smoker  . Smokeless tobacco: Never Used  Substance Use Topics  . Alcohol use: No    Alcohol/week: 0.0 oz  . Drug use: No     Allergies   Livalo [pitavastatin]; Aspirin; Gadolinium; Hydrocodone-acetaminophen; Iohexol; Lipitor [atorvastatin]; and Crestor [rosuvastatin calcium]   Review of Systems Review of Systems  Constitutional: Positive for diaphoresis.  Respiratory: Positive for shortness of breath. Negative for cough and sputum production.   Cardiovascular: Positive for chest pain.  Gastrointestinal: Negative for nausea and vomiting.  Musculoskeletal: Positive for back pain.  All other systems reviewed and are negative.    Physical Exam Updated Vital Signs Ht 5' 7"  (1.702 m)   Wt 84.4 kg (186 lb)   BMI 29.13 kg/m   Physical Exam  Constitutional: She is oriented to person, place, and time. She appears well-developed and well-nourished. No  distress.  HENT:  Head: Normocephalic and atraumatic.  Mouth/Throat: Oropharynx is clear and moist.  Eyes: Pupils are equal, round, and reactive to light. Conjunctivae and EOM are normal.  Neck: Normal range of motion. Neck supple.  Cardiovascular: Normal rate, regular rhythm and intact distal pulses.  No murmur heard. Pulmonary/Chest: Effort normal and breath sounds normal. No respiratory distress. She has no wheezes. She has no rales.  Abdominal: Soft. She exhibits no distension. There is no tenderness. There is no rebound and no guarding.  Musculoskeletal: Normal range of motion. She exhibits no edema or tenderness.  Neurological: She is alert and oriented to person, place, and time.  Skin: Skin is warm and dry. No rash noted. No erythema.  Psychiatric: She has a normal mood and affect. Her behavior is normal.  Nursing note and vitals reviewed.    ED Treatments / Results  Labs (all labs ordered are listed, but only abnormal results are displayed) Labs Reviewed  BASIC METABOLIC PANEL - Abnormal; Notable for the following components:      Result Value   Glucose, Bld 101 (*)    All other components within normal limits  CBC - Abnormal; Notable for the following components:   Hemoglobin 11.5 (*)    HCT 33.9 (*)    All other components within normal limits  I-STAT TROPONIN, ED  I-STAT TROPONIN, ED    EKG EKG Interpretation  Date/Time:  Saturday May 13 2018 11:54:33 EDT Ventricular Rate:  80 PR Interval:    QRS Duration: 89 QT Interval:  379 QTC Calculation: 438 R Axis:   93 Text Interpretation:  Sinus rhythm Right axis deviation No significant change since last tracing Confirmed by Blanchie Dessert [phone]) on 05/13/2018 12:03:09 PM   Radiology Dg Chest 2 View  Result Date: 05/13/2018 CLINICAL DATA:  Chest pain. Recent heart attack. History of hypertension. EXAM: CHEST - 2 VIEW COMPARISON:  05/08/2018 FINDINGS: Mild cardiac enlargement. No pleural effusion or edema. No  airspace  opacities identified. The visualized IMPRESSION: No active cardiopulmonary disease. Electronically Signed   By: Kerby Moors M.D.   On: 05/13/2018 12:36    Procedures Procedures (including critical care time)  Medications Ordered in ED Medications - No data to display   Initial Impression / Assessment and Plan / ED Course  I have reviewed the triage vital signs and the nursing notes.  Pertinent labs & imaging results that were available during my care of the patient were reviewed by me and considered in my medical decision making (see chart for details).     Patient presenting today with a complaint of chest pain concerning for cardiac etiology.  Of note patient recently had catheterization this week showing a 70% stenosis in 1 of the branches that was not amenable to stenting but otherwise widely patent arteries.  She did have mild decreased EF of 45 to 50% and was thought to have a possibly stress-induced cardiomyopathy.  Patient has been compliant with medication and did take 2 nitroglycerin today with significant improvement in her chest discomfort.  She does still have some mild anterior pain but the back pain is resolved.  Low suspicion that patient has PE, pneumonia or pneumothorax.  Could have post catheterization pericarditis but symptoms are not worse with a deep breath or leaning forward.  Low suspicion for anemia today.  Patient is allergic to aspirin so will not be given any here.  However will do 1 more nitro to see if we can get her pain to a 0.  Patient's EKG is relatively unchanged today without significant evidence of ST elevation or signs of MI.  CBC, BMP and troponin are pending.  Chest x-ray is pending.  Will consult cardiology.  1:36 PM Since initial labs and x-ray are reassuring without evidence of elevated troponin.  After 1 nitroglycerin here patient's pain has completely resolved.  Will discuss the case with cardiology.  4:00 PM Since repeat troponin is  within normal limits.  Given patient had completely patent vessels otherwise other than a very small off branch that was not amenable to therapy and negative troponins and patient being pain-free cardiology agreed that it would be reasonable for discharge and give patient close follow-up with her cardiologist.  Patient and her family were happy with this plan.  Final Clinical Impressions(s) / ED Diagnoses   Final diagnoses:  Chest pain, unspecified type    ED Discharge Orders    None       Blanchie Dessert, MD 05/14/18 1601

## 2018-05-13 NOTE — ED Notes (Signed)
Pt alert and oriented, in NAD. Pt verbalized understanding of discharge instructions.

## 2018-05-13 NOTE — ED Triage Notes (Signed)
Patient arrived by [facility] for chest pain with radiation to back and neck that started around 11am. Patient just discharged from hospital following a CODE STEMI with open vessels and diagnosed with stress cardiomyopathy. Patient took 2 SL NTG prior to EMS arrival with pain down to 8/10 and EMS administered additional NTG with pain to 3/10. No SOB, complains of diaphoresis and nausea. Has had a medication change following the event on 6/10 and allergic to aspirin. Alert and oriented, NAD

## 2018-05-15 ENCOUNTER — Telehealth: Payer: Self-pay

## 2018-05-15 NOTE — Telephone Encounter (Signed)
**Note De-Identified [name] Obfuscation** Patient contacted regarding discharge from [facility] on 05/13/18.  Patient understands to follow up with provider Pecolia Ades, NP on 05/16/18 at 11 am at [facility] [place]. Patient understands discharge instructions? Yes Patient understands medications and regiment? Yes Patient understands to bring all medications to this visit? Yes

## 2018-05-15 NOTE — Telephone Encounter (Signed)
-----   Message from Rivka Barbara sent at 05/11/2018 12:46 PM EDT ----- Regarding: TOC  TOC appointment Pecolia Ades 06/18 11:00am

## 2018-05-16 ENCOUNTER — Ambulatory Visit (INDEPENDENT_AMBULATORY_CARE_PROVIDER_SITE_OTHER): Payer: Medicare Other | Admitting: Cardiology

## 2018-05-16 VITALS — BP 114/72 | HR 84 | Ht 67.0 in | Wt 187.0 lb

## 2018-05-16 DIAGNOSIS — I2510 Atherosclerotic heart disease of native coronary artery without angina pectoris: Secondary | ICD-10-CM | POA: Insufficient documentation

## 2018-05-16 DIAGNOSIS — E785 Hyperlipidemia, unspecified: Secondary | ICD-10-CM | POA: Diagnosis not present

## 2018-05-16 DIAGNOSIS — I10 Essential (primary) hypertension: Secondary | ICD-10-CM | POA: Diagnosis not present

## 2018-05-16 LAB — CBC
HEMOGLOBIN: 12.5 g/dL (ref 11.1–15.9)
Hematocrit: 36.8 % (ref 34.0–46.6)
MCH: 27.7 pg (ref 26.6–33.0)
MCHC: 34 g/dL (ref 31.5–35.7)
MCV: 82 fL (ref 79–97)
Platelets: 383 10*3/uL (ref 150–450)
RBC: 4.51 x10E6/uL (ref 3.77–5.28)
RDW: 14.1 % (ref 12.3–15.4)
WBC: 8.4 10*3/uL (ref 3.4–10.8)

## 2018-05-16 MED ORDER — ROSUVASTATIN CALCIUM 10 MG PO TABS: 10.0000 mg | ORAL_TABLET | Freq: Every day | ORAL | 3 refills | Status: DC

## 2018-05-16 NOTE — Patient Instructions (Addendum)
Medication Instructions: Your physician has recommended you make the following change in your medication:  INCREASE: Crestor to 10 mg daily ( You can finish of the bottle you already have by taking 2 of your 5 mg tablets)     Labwork: TODAY: CBC  Your physician recommends that you return for a FASTING lipid profile in 6 weeks    Procedures/Testing: None  Follow-Up: Your physician recommends that you schedule a follow-up appointment in: 3 months with Dr.[facility] or Daune Perch NP    Any Additional Special Instructions Will Be Listed Below (If Applicable).   DASH Eating Plan DASH stands for "Dietary Approaches to Stop Hypertension." The DASH eating plan is a healthy eating plan that has been shown to reduce high blood pressure (hypertension). It may also reduce your risk for type 2 diabetes, heart disease, and stroke. The DASH eating plan may also help with weight loss. What are tips for following this plan? General guidelines  Avoid eating more than 2,300 mg (milligrams) of salt (sodium) a day. If you have hypertension, you may need to reduce your sodium intake to 1,500 mg a day.  Limit alcohol intake to no more than 1 drink a day for nonpregnant women and 2 drinks a day for men. One drink equals 12 oz of beer, 5 oz of wine, or 1 oz of hard liquor.  Work with your health care provider to maintain a healthy body weight or to lose weight. Ask what an ideal weight is for you.  Get at least 30 minutes of exercise that causes your heart to beat faster (aerobic exercise) most days of the week. Activities may include walking, swimming, or biking.  Work with your health care provider or diet and nutrition specialist (dietitian) to adjust your eating plan to your individual calorie needs. Reading food labels  Check food labels for the amount of sodium per serving. Choose foods with less than 5 percent of the Daily Value of sodium. Generally, foods with less than 300 mg of sodium per  serving fit into this eating plan.  To find whole grains, look for the word "whole" as the first word in the ingredient list. Shopping  Buy products labeled as "low-sodium" or "no salt added."  Buy fresh foods. Avoid canned foods and premade or frozen meals. Cooking  Avoid adding salt when cooking. Use salt-free seasonings or herbs instead of table salt or sea salt. Check with your health care provider or pharmacist before using salt substitutes.  Do not fry foods. Cook foods using healthy methods such as baking, boiling, grilling, and broiling instead.  Cook with heart-healthy oils, such as olive, canola, soybean, or sunflower oil. Meal planning   Eat a balanced diet that includes: ? 5 or more servings of fruits and vegetables each day. At each meal, try to fill half of your plate with fruits and vegetables. ? Up to 6-8 servings of whole grains each day. ? Less than 6 oz of lean meat, poultry, or fish each day. A 3-oz serving of meat is about the same size as a deck of cards. One egg equals 1 oz. ? 2 servings of low-fat dairy each day. ? A serving of nuts, seeds, or beans 5 times each week. ? Heart-healthy fats. Healthy fats called Omega-3 fatty acids are found in foods such as flaxseeds and coldwater fish, like sardines, salmon, and mackerel.  Limit how much you eat of the following: ? Canned or prepackaged foods. ? Food that is high in trans  fat, such as fried foods. ? Food that is high in saturated fat, such as fatty meat. ? Sweets, desserts, sugary drinks, and other foods with added sugar. ? Full-fat dairy products.  Do not salt foods before eating.  Try to eat at least 2 vegetarian meals each week.  Eat more home-cooked food and less restaurant, buffet, and fast food.  When eating at a restaurant, ask that your food be prepared with less salt or no salt, if possible. What foods are recommended? The items listed may not be a complete list. Talk with your dietitian about  what dietary choices are best for you. Grains Whole-grain or whole-wheat bread. Whole-grain or whole-wheat pasta. Brown rice. [place]. Bulgur. Whole-grain and low-sodium cereals. Pita bread. Low-fat, low-sodium crackers. Whole-wheat flour tortillas. Vegetables Fresh or frozen vegetables (raw, steamed, roasted, or grilled). Low-sodium or reduced-sodium tomato and vegetable juice. Low-sodium or reduced-sodium tomato sauce and tomato paste. Low-sodium or reduced-sodium canned vegetables. Fruits All fresh, dried, or frozen fruit. Canned fruit in natural juice (without added sugar). Meat and other protein foods Skinless chicken or [place]. Ground chicken or [place]. Pork with fat trimmed off. Fish and seafood. Egg whites. Dried beans, peas, or lentils. Unsalted nuts, nut butters, and seeds. Unsalted canned beans. Lean cuts of beef with fat trimmed off. Low-sodium, lean deli meat. Dairy Low-fat (1%) or fat-free (skim) milk. Fat-free, low-fat, or reduced-fat cheeses. Nonfat, low-sodium ricotta or cottage cheese. Low-fat or nonfat yogurt. Low-fat, low-sodium cheese. Fats and oils Soft margarine without trans fats. Vegetable oil. Low-fat, reduced-fat, or light mayonnaise and salad dressings (reduced-sodium). Canola, safflower, olive, soybean, and sunflower oils. Avocado. Seasoning and other foods Herbs. Spices. Seasoning mixes without salt. Unsalted popcorn and pretzels. Fat-free sweets. What foods are not recommended? The items listed may not be a complete list. Talk with your dietitian about what dietary choices are best for you. Grains Baked goods made with fat, such as croissants, muffins, or some breads. Dry pasta or rice meal packs. Vegetables Creamed or fried vegetables. Vegetables in a cheese sauce. Regular canned vegetables (not low-sodium or reduced-sodium). Regular canned tomato sauce and paste (not low-sodium or reduced-sodium). Regular tomato and vegetable juice (not low-sodium or  reduced-sodium). Angie Fava. Olives. Fruits Canned fruit in a light or heavy syrup. Fried fruit. Fruit in cream or butter sauce. Meat and other protein foods Fatty cuts of meat. Ribs. Fried meat. Berniece Salines. Sausage. Bologna and other processed lunch meats. Salami. Fatback. Hotdogs. Bratwurst. Salted nuts and seeds. Canned beans with added salt. Canned or smoked fish. Whole eggs or egg yolks. Chicken or [place] with skin. Dairy Whole or 2% milk, cream, and half-and-half. Whole or full-fat cream cheese. Whole-fat or sweetened yogurt. Full-fat cheese. Nondairy creamers. Whipped toppings. Processed cheese and cheese spreads. Fats and oils Butter. Stick margarine. Lard. Shortening. Ghee. Bacon fat. Tropical oils, such as coconut, palm kernel, or palm oil. Seasoning and other foods Salted popcorn and pretzels. Onion salt, garlic salt, seasoned salt, table salt, and sea salt. Worcestershire sauce. Tartar sauce. Barbecue sauce. Teriyaki sauce. Soy sauce, including reduced-sodium. Steak sauce. Canned and packaged gravies. Fish sauce. Oyster sauce. Cocktail sauce. Horseradish that you find on the shelf. Ketchup. Mustard. Meat flavorings and tenderizers. Bouillon cubes. Hot sauce and Tabasco sauce. Premade or packaged marinades. Premade or packaged taco seasonings. Relishes. Regular salad dressings. Where to find more information:  National Heart, Lung, and [place]: https://wilson-eaton.com/  American Heart Association: www.heart.org Summary  The DASH eating plan is a healthy eating plan that has  been shown to reduce high blood pressure (hypertension). It may also reduce your risk for type 2 diabetes, heart disease, and stroke.  With the DASH eating plan, you should limit salt (sodium) intake to 2,300 mg a day. If you have hypertension, you may need to reduce your sodium intake to 1,500 mg a day.  When on the DASH eating plan, aim to eat more fresh fruits and vegetables, whole grains, lean proteins, low-fat  dairy, and heart-healthy fats.  Work with your health care provider or diet and nutrition specialist (dietitian) to adjust your eating plan to your individual calorie needs. This information is not intended to replace advice given to you by your health care provider. Make sure you discuss any questions you have with your health care provider. Document Released: 11/04/2011 Document Revised: 11/08/2016 Document Reviewed: 11/08/2016 Elsevier Interactive Patient Education  [employer].    If you need a refill on your cardiac medications before your next appointment, please call your pharmacy.
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
## 2018-05-17 ENCOUNTER — Encounter: Payer: Self-pay | Admitting: Cardiology

## 2018-05-22 ENCOUNTER — Telehealth (HOSPITAL_COMMUNITY): Payer: Self-pay

## 2018-05-22 NOTE — Telephone Encounter (Signed)
Called patient to see if she is interested in the Cardiac Rehab Program. Patient stated she is interested. Explained scheduling process to patient and went over insurance, patient verbalized understanding. Will contact patient for scheduling upon review by the RN Navigator.

## 2018-05-23 NOTE — Progress Notes (Signed)
[name] [address], [place] [address] Phone: [phone] Subjective:    I'm seeing this patient by the request  of:    CC: Left knee pain  CNO:[id number]  [name] is a 67 y.o. female coming in with complaint of left knee pain.  Patient has known arthritic changes of the knee.  States that it certainly give her more trouble.  States that she has no swelling.  Affecting daily activities such as going up or down stairs.     Past Medical History:  Diagnosis Date  . Anemia   . Arthritis   . Cholelithiasis   . Chronic back pain   . Constipation   . Esophageal reflux   . Fibromyalgia   . Hyperlipidemia   . Hypertension   . Hypothyroid   . IBS (irritable bowel syndrome)   . Internal hemorrhoids without mention of complication   . Left leg pain   . Personal history of colonic polyps 05/06/2010   ADENOMATOUS POLYP   Past Surgical History:  Procedure Laterality Date  . ABDOMINAL HYSTERECTOMY    . CERVICAL DISC SURGERY     PLATE IN NECK & BACK  . CHOLECYSTECTOMY    . COLONOSCOPY  09/27/2011   NORMAL   . LEFT HEART CATH AND CORONARY ANGIOGRAPHY N/A 05/08/2018   Procedure: LEFT HEART CATH AND CORONARY ANGIOGRAPHY;  Surgeon: Belva Crome, MD;  Location: [facility];  Service: Cardiovascular;  Laterality: N/A;  . LUMBAR [place]    . TONSILLECTOMY     Social History   Socioeconomic History  . Marital status: Widowed    Spouse name: Not on file  . Number of children: 2  . Years of education: Not on file  . Highest education level: Not on file  Occupational History  . Occupation: housewife    Employer: UNEMPLOYED   Social Needs  . Financial resource strain: Not hard at all  . Food insecurity:    Worry: Never true    Inability: Never true  . Transportation needs:    Medical: No    Non-medical: No  Tobacco Use  . Smoking status: Never Smoker  . Smokeless tobacco: Never Used  Substance and Sexual Activity  .  Alcohol use: No    Alcohol/week: 0.0 oz  . Drug use: No  . Sexual activity: Not Currently  Lifestyle  . Physical activity:    Days per week: 3 days    Minutes per session: 40 min  . Stress: Not at all  Relationships  . Social connections:    Talks on phone: More than three times a week    Gets together: More than three times a week    Attends religious service: More than 4 times per year    Active member of club or organization: Yes    Attends meetings of clubs or organizations: More than 4 times per year    Relationship status: Married  Other Topics Concern  . Not on file  Social History Narrative   Daily Caffeine Use   Allergies  Allergen Reactions  . Livalo [Pitavastatin] Other (See Comments)    myalgias  . Aspirin Other (See Comments)    unknown  . Gadolinium Hives    patient unsure of which kind of dye she had a reaction to until reaction to Magnevist - may be allergic to x-ray contrast, Onset Date: [id number]   . Hydrocodone-Acetaminophen   . Iohexol Hives  patient unsure of which kind of dye she had a reaction to until reaction to Magnevist - may be allergic to x-ray contrast, Onset Date: [id number]  . Lipitor [Atorvastatin] Other (See Comments)    Myalgias   Family History  Problem Relation Age of Onset  . Diabetes Mother        aunts and uncles  . Kidney disease Mother        stage 3  . Lung disease Mother        colapsed lung/in hospice  . COPD Mother   . Hypertension Mother   . Dementia Mother   . Breast cancer Maternal Aunt   . Heart failure Father        Mother  . Breast cancer Cousin   . Colon cancer Neg Hx      Past medical history, social, surgical and family history all reviewed in electronic medical record.  No pertanent information unless stated regarding to the chief complaint.   Review of Systems:Review of systems updated and as accurate as of 05/24/18  No headache, visual changes, nausea, vomiting, diarrhea, constipation, dizziness,  abdominal pain, skin rash, fevers, chills, night sweats, weight loss, swollen lymph nodes, body aches, joint swelling, muscle aches, chest pain, shortness of breath, mood changes.  Positive muscle aches  Objective  Blood pressure 122/74, pulse 80, height 5' 7"  (1.702 m), weight 188 lb (85.3 kg), SpO2 97 %. Systems examined below as of 05/24/18   General: No apparent distress alert and oriented x3 mood and affect normal, dressed appropriately.  HEENT: Pupils equal, extraocular movements intact  Respiratory: Patient's speak in full sentences and does not appear short of breath  Cardiovascular: No lower extremity edema, non tender, no erythema  Skin: Warm dry intact with no signs of infection or rash on extremities or on axial skeleton.  Abdomen: Soft nontender  Neuro: Cranial nerves II through XII are intact, neurovascularly intact in all extremities with 2+ DTRs and 2+ pulses.  Lymph: No lymphadenopathy of posterior or anterior cervical chain or axillae bilaterally.  Gait mild antalgic MSK:  Non tender with full range of motion and good stability and symmetric strength and tone of shoulders, elbows, wrist, hip, and ankles bilaterally.  Mild arthritic changes of the left hip Knee: Left valgus deformity noted. Large thigh to calf ratio.  Tender to palpation over medial and PF joint line.  ROM full in flexion and extension and lower leg rotation. instability with valgus force.  painful patellar compression. Patellar glide with moderate crepitus. Patellar and quadriceps tendons unremarkable. Hamstring and quadriceps strength is normal. Contralateral knee shows mild arthritis as well  After informed written and verbal consent, patient was seated on exam table. Left knee was prepped with alcohol swab and utilizing anterolateral approach, patient's left knee space was injected with 4:1  marcaine 0.5%: Kenalog 36m/dL. Patient tolerated the procedure well without immediate complications.      Impression and Recommendations:     This case required medical decision making of moderate complexity.      Note: This dictation was prepared with Dragon dictation along with smaller phrase technology. Any transcriptional errors that result from this process are unintentional.

## 2018-05-24 ENCOUNTER — Encounter: Payer: Self-pay | Admitting: Family Medicine

## 2018-05-24 ENCOUNTER — Ambulatory Visit (INDEPENDENT_AMBULATORY_CARE_PROVIDER_SITE_OTHER): Payer: Medicare Other | Admitting: Family Medicine

## 2018-05-24 DIAGNOSIS — M1712 Unilateral primary osteoarthritis, left knee: Secondary | ICD-10-CM | POA: Insufficient documentation

## 2018-05-24 NOTE — Assessment & Plan Note (Signed)
Patient was given injection today.  Have not r injected knee previously at this time.  I believe that this will be more beneficial.  Unable to do anti-inflammatories this patient's more cardiovascular risk at this time.  Patient is on a blood thinner as well.  Discussed icing regimen and topical anti-inflammatories.  Patient will continue the vitamin D supplementation.  Follow-up with me again in 4 to 6 weeks if worsening pain and could be a candidate for Visco supplementation otherwise will see me every 3 to 6 months

## 2018-05-24 NOTE — Patient Instructions (Signed)
Good to see you  I injected the knee today  pennsaid pinkie amount topically 2 times daily as needed.  Stay active.  Good luck with rehab Can repeat injection every 4-6 months when you need me

## 2018-05-29 ENCOUNTER — Telehealth (HOSPITAL_COMMUNITY): Payer: Self-pay

## 2018-05-29 NOTE — Telephone Encounter (Signed)
Called patient to see if she was interested in participating in the Cardiac Rehab Program. Patient stated yes. Patient will come in for orientation on 07/06/18 @ 7:45AM and will attend the 6:45AM exercise class.  Mailed homework package.

## 2018-05-30 ENCOUNTER — Telehealth: Payer: Self-pay | Admitting: Cardiovascular Disease

## 2018-05-30 DIAGNOSIS — M542 Cervicalgia: Secondary | ICD-10-CM | POA: Diagnosis not present

## 2018-05-30 DIAGNOSIS — I10 Essential (primary) hypertension: Secondary | ICD-10-CM | POA: Diagnosis not present

## 2018-05-30 DIAGNOSIS — M5440 Lumbago with sciatica, unspecified side: Secondary | ICD-10-CM | POA: Diagnosis not present

## 2018-05-30 NOTE — Telephone Encounter (Signed)
New Message   Patient is in lobby waiting to speak with someone  Pt c/o BP issue: STAT if pt c/o blurred vision, one-sided weakness or slurred speech  1. What are your last 5 BP readings? 150/80 152/81   2. Are you having any other symptoms (ex. Dizziness, headache, blurred vision, passed out)?  Sob   3. What is your BP issue? Patient was at doctor this morning and they told her to come over her to have her bp checked
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
## 2018-06-27 ENCOUNTER — Other Ambulatory Visit: Payer: Medicare Other

## 2018-06-27 DIAGNOSIS — I10 Essential (primary) hypertension: Secondary | ICD-10-CM

## 2018-06-27 DIAGNOSIS — I2510 Atherosclerotic heart disease of native coronary artery without angina pectoris: Secondary | ICD-10-CM

## 2018-06-27 LAB — LIPID PANEL
CHOLESTEROL TOTAL: 109 mg/dL (ref 100–199)
Chol/HDL Ratio: 2.5 ratio (ref 0.0–4.4)
HDL: 43 mg/dL (ref >39)
LDL Calculated: 43 mg/dL (ref 0–99)
TRIGLYCERIDES: 114 mg/dL (ref 0–149)
VLDL CHOLESTEROL CAL: 23 mg/dL (ref 5–40)

## 2018-06-29 ENCOUNTER — Telehealth (HOSPITAL_COMMUNITY): Payer: Self-pay

## 2018-06-29 NOTE — Telephone Encounter (Signed)
Cardiac Rehab Medication Review by a Pharmacist  Does the patient  feel that his/her medications are working for him/her?  yes  Has the patient been experiencing any side effects to the medications prescribed?  no  Does the patient measure his/her own blood pressure or blood glucose at home?  yes   Does the patient have any problems obtaining medications due to transportation or finances?   yes  Understanding of regimen: fair Understanding of indications: good Potential of compliance: good    Pharmacist comments: [name] has trouble with some of the names fo her medications but understands the indications and importance of her medications.    Rush Barer 06/29/2018 5:25 PM

## 2018-07-06 ENCOUNTER — Encounter (HOSPITAL_COMMUNITY)
Admission: RE | Admit: 2018-07-06 | Discharge: 2018-07-06 | Disposition: A | Discharge status: Home / Self Care | Payer: Medicare Other | Source: Ambulatory Visit | Attending: Cardiovascular Disease | Admitting: Cardiovascular Disease

## 2018-07-06 ENCOUNTER — Encounter (HOSPITAL_COMMUNITY): Payer: Self-pay

## 2018-07-06 VITALS — Ht 67.0 in | Wt 189.6 lb

## 2018-07-06 DIAGNOSIS — Z79890 Hormone replacement therapy (postmenopausal): Secondary | ICD-10-CM | POA: Diagnosis not present

## 2018-07-06 DIAGNOSIS — I10 Essential (primary) hypertension: Secondary | ICD-10-CM | POA: Insufficient documentation

## 2018-07-06 DIAGNOSIS — I213 ST elevation (STEMI) myocardial infarction of unspecified site: Secondary | ICD-10-CM | POA: Insufficient documentation

## 2018-07-06 DIAGNOSIS — Z79899 Other long term (current) drug therapy: Secondary | ICD-10-CM | POA: Insufficient documentation

## 2018-07-06 DIAGNOSIS — E785 Hyperlipidemia, unspecified: Secondary | ICD-10-CM | POA: Diagnosis not present

## 2018-07-06 NOTE — Progress Notes (Signed)
[name] 67 y.o. female DOB: 06-23-1951 MRN: [id number]      Nutrition Note  No diagnosis found. Past Medical History:  Diagnosis Date  . Anemia   . Arthritis   . Cholelithiasis   . Chronic back pain   . Constipation   . Esophageal reflux   . Fibromyalgia   . Hyperlipidemia   . Hypertension   . Hypothyroid   . IBS (irritable bowel syndrome)   . Internal hemorrhoids without mention of complication   . Left leg pain   . Personal history of colonic polyps 05/06/2010   ADENOMATOUS POLYP   Meds reviewed. Synthroid, rosuvastatin, Vit D, Toprol noted  HT: Ht Readings from Last 1 Encounters:  05/24/18 5' 7"  (1.702 m)    WT: Wt Readings from Last 5 Encounters:  05/24/18 188 lb (85.3 kg)  05/16/18 187 lb (84.8 kg)  05/13/18 186 lb (84.4 kg)  05/08/18 191 lb 5.8 oz (86.8 kg)  05/08/18 186 lb (84.4 kg)     There is no height or weight on file to calculate BMI.   Current tobacco use? No      Labs:  Lipid Panel     Component Value Date/Time   CHOL 109 06/27/2018 0812   TRIG 114 06/27/2018 0812   HDL 43 06/27/2018 0812   CHOLHDL 2.5 06/27/2018 0812   CHOLHDL 3.8 05/08/2018 1155   VLDL 30 05/08/2018 1155   LDLCALC 43 06/27/2018 0812   LDLDIRECT 88.0 10/19/2016 0844    Lab Results  Component Value Date   HGBA1C 6.1 05/08/2018   CBG (last 3)  No results for input(s): GLUCAP in the last 72 hours.  Nutrition Note Spoke with pt. Nutrition plan and goals reviewed with pt. Pt did not complete MEDFICTS survey, will fill out and return first day of rehab. Pt wants to lose wt. Pt has not actively been trying to lose wt. Wt loss tips reviewed, limiting portion size, eating additional non-starchy vegetables, eating frequently across the day, food Journaling, etc. Pt shared she usually drinks 2 large sweet teas daily, recommended decreasing sugar sweetened beverages. Pt shared she usually eats 1x a day, out of habit. Eating infrequently started when her husband passed away, pt  reports it's not d/t difficulty obtaining food. Pt is pre- diabetic, last A1c 6.1. Recommended decreasing sugar sweetened beverages and other refined carbohydrates. Discussed following a carbohydrate consistent heart healthy diet. Pt was open to making changes and expressed excitement to join the program. Per discussion, Pt eats out requently, 3x a week. Discussed eating more frequently at home to help control the amount of sodium, and trans/ saturated fat consumed. Pt expressed understanding of the information reviewed via feed back method. Pt aware of nutrition education classes offered and plans on attending nutrition classes.  Nutrition Diagnosis ? Food-and nutrition-related knowledge deficit related to lack of exposure to information as related to diagnosis of: ? CVD  ? Pre-DM ? Overweight related to excessive energy intake as evidenced by a Body mass index is 29.69 kg/m.   Nutrition Intervention ? Pt's individual nutrition plan and goals reviewed with pt.  Nutrition Goal(s):   ? Pt to identify and limit food sources of saturated fat, trans fat, sodium, and refined carbohydrates ? Pt to identify food quantities necessary to achieve weight loss of 6-24 lbs. at graduation from cardiac rehab. Goal wt of 170 lb desired.  ? Pt to decrease sugar sweetened beverages ? Pt to eat more frequently across the day ? Pt able  to name foods that affect blood glucose.   Plan:  ? Pt to attend nutrition classes ? Nutrition I ? Nutrition II ? Portion Distortion  ? Diabetes Blitz ? Diabetes Q & Ae determined ? Will provide client-centered nutrition education as part of interdisciplinary care ? Monitor and evaluate progress toward nutrition goal with team.   Laurina Bustle, MS, RD, LDN 07/06/2018 10:15 AM

## 2018-07-06 NOTE — Progress Notes (Signed)
Cardiac Individual Treatment Plan  Patient Details  Name: [name] MRN: [id number] Date of Birth: 09/07/1951 Referring Provider:     CARDIAC REHAB PHASE II ORIENTATION from 07/06/2018 in [facility]  Referring Provider  [name] MD       Initial Encounter Date:    CARDIAC REHAB PHASE II ORIENTATION from 07/06/2018 in [facility]  Date  07/06/18      Visit Diagnosis: ST elevation myocardial infarction (STEMI), unspecified artery ([place])  Patient's Home Medications on Admission:  Current Outpatient Medications:  .  benzonatate (TESSALON) 100 MG capsule, Take 1 capsule (100 mg total) by mouth 3 (three) times daily as needed for cough. (Patient not taking: Reported on 06/29/2018), Disp: 20 capsule, Rfl: 0 .  clopidogrel (PLAVIX) 75 MG tablet, Take 1 tablet (75 mg total) by mouth daily with breakfast., Disp: 90 tablet, Rfl: 3 .  fluticasone (FLONASE) 50 MCG/ACT nasal spray, Place 1 spray into both nostrils daily. (Patient not taking: Reported on 06/29/2018), Disp: 16 g, Rfl: 2 .  isosorbide mononitrate (IMDUR) 30 MG 24 hr tablet, Take 1 tablet (30 mg total) by mouth daily., Disp: 90 tablet, Rfl: 3 .  levothyroxine (SYNTHROID, LEVOTHROID) 125 MCG tablet, Take 1 tablet (125 mcg total) by mouth daily before breakfast., Disp: 90 tablet, Rfl: 0 .  loratadine (CLARITIN) 10 MG tablet, Take 1 tablet (10 mg total) by mouth daily. (Patient not taking: Reported on 06/29/2018), Disp: 30 tablet, Rfl: 3 .  losartan (COZAAR) 25 MG tablet, Take 1 tablet (25 mg total) by mouth daily., Disp: 90 tablet, Rfl: 3 .  metoprolol succinate (TOPROL-XL) 25 MG 24 hr tablet, Take 1 tablet (25 mg total) by mouth daily., Disp: 90 tablet, Rfl: 3 .  nitroGLYCERIN (NITROSTAT) 0.4 MG SL tablet, Place 1 tablet (0.4 mg total) under the tongue every 5 (five) minutes as needed for chest pain., Disp: 25 tablet, Rfl: 1 .  rosuvastatin (CRESTOR) 10 MG tablet, Take 1 tablet  (10 mg total) by mouth daily., Disp: 90 tablet, Rfl: 3 .  Vitamin D, Ergocalciferol, (DRISDOL) 50000 units CAPS capsule, TAKE 1 CAPSULE (50,000 UNITS TOTAL) BY MOUTH EVERY 7 (SEVEN) DAYS., Disp: 12 capsule, Rfl: 0  Past Medical History: Past Medical History:  Diagnosis Date  . Anemia   . Arthritis   . Cholelithiasis   . Chronic back pain   . Constipation   . Esophageal reflux   . Fibromyalgia   . Hyperlipidemia   . Hypertension   . Hypothyroid   . IBS (irritable bowel syndrome)   . Internal hemorrhoids without mention of complication   . Left leg pain   . Personal history of colonic polyps 05/06/2010   ADENOMATOUS POLYP    Tobacco Use: Social History   Tobacco Use  Smoking Status Never Smoker  Smokeless Tobacco Never Used    Labs: Recent Review Flowsheet Data    Labs for ITP Cardiac and Pulmonary Rehab Latest Ref Rng & Units 12/14/2016 07/11/2017 05/08/2018 05/08/2018 06/27/2018   Cholestrol 100 - 199 mg/dL - 146 184 200 109   LDLCALC 0 - 99 mg/dL - 74 110(H) 118(H) 43   LDLDIRECT mg/dL - - - - -   HDL >39 mg/dL - 36.40(L) 43.00 52 43   Trlycerides 0 - 149 mg/dL - 180.0(H) 158.0(H) 148 114   Hemoglobin A1c 4.6 - 6.5 % 6.2 5.9 6.1 - -      Capillary Blood Glucose: No results found for:  GLUCAP   Exercise Target Goals: Date: 07/06/18  Exercise Program Goal: Individual exercise prescription set using results from initial 6 min walk test and THRR while considering  patient's activity barriers and safety.   Exercise Prescription Goal: Initial exercise prescription builds to 30-45 minutes a day of aerobic activity, 2-3 days per week.  Home exercise guidelines will be given to patient during program as part of exercise prescription that the participant will acknowledge.  Activity Barriers & Risk Stratification: Activity Barriers & Cardiac Risk Stratification - 07/06/18 1050      Activity Barriers & Cardiac Risk Stratification   Activity Barriers  Other (comment)     Comments  Right Knee Pain    Cardiac Risk Stratification  High       6 Minute Walk: 6 Minute Walk    Row Name 07/06/18 1047         6 Minute Walk   Phase  Initial     Distance  1706 feet     Walk Time  6 minutes     # of Rest Breaks  0     MPH  3.23     METS  3.63     RPE  11     Perceived Dyspnea   0     VO2 Peak  12.69     Symptoms  No     Resting HR  58 bpm     Resting BP  114/68     Resting Oxygen Saturation   98 %     Exercise Oxygen Saturation  during 6 min walk  96 %     Max Ex. HR  96 bpm     Max Ex. BP  134/70     2 Minute Post BP  130/76        Oxygen Initial Assessment:   Oxygen Re-Evaluation:   Oxygen Discharge (Final Oxygen Re-Evaluation):   Initial Exercise Prescription: Initial Exercise Prescription - 07/06/18 1000      Date of Initial Exercise RX and Referring Provider   Date  07/06/18    Referring Provider  [name] MD     Expected Discharge Date  09/29/18      Recumbant Bike   Level  2    Watts  40    Minutes  10    METs  3.48      NuStep   Level  3    SPM  75    Minutes  10    METs  2.6      Track   Laps  13    Minutes  10    METs  3.3      Prescription Details   Frequency (times per week)  3x    Duration  Progress to 30 minutes of continuous aerobic without signs/symptoms of physical distress      Intensity   THRR 40-80% of Max Heartrate  61-122    Ratings of Perceived Exertion  11-13    Perceived Dyspnea  0-4      Progression   Progression  Continue progressive overload as per policy without signs/symptoms or physical distress.      Resistance Training   Training Prescription  Yes    Weight  3lbs    Reps  10-15       Perform Capillary Blood Glucose checks as needed.  Exercise Prescription Changes:   Exercise Comments:   Exercise Goals and Review: Exercise Goals    Row Name 07/06/18 1050  Exercise Goals   Increase Physical Activity  Yes       Intervention  Provide advice,  education, support and counseling about physical activity/exercise needs.;Develop an individualized exercise prescription for aerobic and resistive training based on initial evaluation findings, risk stratification, comorbidities and participant's personal goals.       Expected Outcomes  Short Term: Attend rehab on a regular basis to increase amount of physical activity.;Long Term: Add in home exercise to make exercise part of routine and to increase amount of physical activity.;Long Term: Exercising regularly at least 3-5 days a week.       Increase Strength and Stamina  Yes       Intervention  Provide advice, education, support and counseling about physical activity/exercise needs.;Develop an individualized exercise prescription for aerobic and resistive training based on initial evaluation findings, risk stratification, comorbidities and participant's personal goals.       Expected Outcomes  Short Term: Increase workloads from initial exercise prescription for resistance, speed, and METs.;Short Term: Perform resistance training exercises routinely during rehab and add in resistance training at home;Long Term: Improve cardiorespiratory fitness, muscular endurance and strength as measured by increased METs and functional capacity (6MWT)       Able to understand and use rate of perceived exertion (RPE) scale  Yes       Intervention  Provide education and explanation on how to use RPE scale       Expected Outcomes  Short Term: Able to use RPE daily in rehab to express subjective intensity level;Long Term:  Able to use RPE to guide intensity level when exercising independently       Knowledge and understanding of Target Heart Rate Range (THRR)  Yes       Intervention  Provide education and explanation of THRR including how the numbers were predicted and where they are located for reference       Expected Outcomes  Short Term: Able to state/look up THRR;Short Term: Able to use daily as guideline for intensity  in rehab;Long Term: Able to use THRR to govern intensity when exercising independently       Able to check pulse independently  Yes       Intervention  Provide education and demonstration on how to check pulse in carotid and radial arteries.;Review the importance of being able to check your own pulse for safety during independent exercise       Expected Outcomes  Short Term: Able to explain why pulse checking is important during independent exercise;Long Term: Able to check pulse independently and accurately       Understanding of Exercise Prescription  Yes       Intervention  Provide education, explanation, and written materials on patient's individual exercise prescription       Expected Outcomes  Short Term: Able to explain program exercise prescription;Long Term: Able to explain home exercise prescription to exercise independently          Exercise Goals Re-Evaluation :    Discharge Exercise Prescription (Final Exercise Prescription Changes):   Nutrition:  Target Goals: Understanding of nutrition guidelines, daily intake of sodium <1531m, cholesterol <2051m calories 30% from fat and 7% or less from saturated fats, daily to have 5 or more servings of fruits and vegetables.  Biometrics: Pre Biometrics - 07/06/18 1051      Pre Biometrics   Height  5' 7"  (1.702 m)    Weight  86 kg    Waist Circumference  39 inches  Hip Circumference  42 inches    Waist to Hip Ratio  0.93 %    BMI (Calculated)  29.69    Triceps Skinfold  30 mm    % Body Fat  41.3 %    Grip Strength  36 kg    Flexibility  15.5 in    Single Leg Stand  12.47 seconds        Nutrition Therapy Plan and Nutrition Goals: Nutrition Therapy & Goals - 07/06/18 1022      Nutrition Therapy   Diet  carbohydrate consistent heart healthy      Personal Nutrition Goals   Nutrition Goal  Pt to identify and limit food sources of saturated fat, trans fat, sodium, and refined carbohydrates    Personal Goal #2  Pt to  identify food quantities necessary to achieve weight loss of 6-24 lbs. at graduation from cardiac rehab. Goal wt of 170 lb desired.    Personal Goal #3  Pt able to name foods that affect blood glucose.      Intervention Plan   Intervention  Prescribe, educate and counsel regarding individualized specific dietary modifications aiming towards targeted core components such as weight, hypertension, lipid management, diabetes, heart failure and other comorbidities.    Expected Outcomes  Short Term Goal: Understand basic principles of dietary content, such as calories, fat, sodium, cholesterol and nutrients.       Nutrition Assessments: Nutrition Assessments - 07/06/18 1027      MEDFICTS Scores   Pre Score  --       Nutrition Goals Re-Evaluation: Nutrition Goals Re-Evaluation    [place] Name 07/06/18 1022             Goals   Current Weight  189 lb 9.5 oz (86 kg)          Nutrition Goals Re-Evaluation: Nutrition Goals Re-Evaluation    [place] Name 07/06/18 1022             Goals   Current Weight  189 lb 9.5 oz (86 kg)          Nutrition Goals Discharge (Final Nutrition Goals Re-Evaluation): Nutrition Goals Re-Evaluation - 07/06/18 1022      Goals   Current Weight  189 lb 9.5 oz (86 kg)       Psychosocial: Target Goals: Acknowledge presence or absence of significant depression and/or stress, maximize coping skills, provide positive support system. Participant is able to verbalize types and ability to use techniques and skills needed for reducing stress and depression.  Initial Review & Psychosocial Screening: Initial Psych Review & Screening - 07/06/18 1002      Initial Review   Current issues with  None Identified      Family Dynamics   Good Support System?  Yes      Barriers   Psychosocial barriers to participate in program  There are no identifiable barriers or psychosocial needs.      Screening Interventions   Interventions  Encouraged to exercise        Quality of Life Scores: Quality of Life - 07/06/18 1008      Quality of Life   Select  Quality of Life      Quality of Life Scores   Health/Function Pre  26.39 %    Socioeconomic Pre  24 %    Psych/Spiritual Pre  25.71 %    Family Pre  30 %    GLOBAL Pre  26.17 %      Scores of  19 and below usually indicate a poorer quality of life in these areas.  A difference of  2-3 points is a clinically meaningful difference.  A difference of 2-3 points in the total score of the Quality of Life Index has been associated with significant improvement in overall quality of life, self-image, physical symptoms, and general health in studies assessing change in quality of life.  PHQ-9: Recent Review Flowsheet Data    Depression screen [facility] 2/9 05/08/2018 07/12/2017 01/01/2015 08/10/2013   Decreased Interest 0 0 0 0   Down, Depressed, Hopeless 0 0 0 0   PHQ - 2 Score 0 0 0 0   Altered sleeping 0 - - -   Tired, decreased energy 0 - - -   Change in appetite 0 - - -   Feeling bad or failure about yourself  0 - - -   Trouble concentrating 0 - - -   Moving slowly or fidgety/restless 0 - - -   Suicidal thoughts 0 - - -   PHQ-9 Score 0 - - -   Difficult doing work/chores Not difficult at all - - -     Interpretation of Total Score  Total Score Depression Severity:  1-4 = Minimal depression, 5-9 = Mild depression, 10-14 = Moderate depression, 15-19 = Moderately severe depression, 20-27 = Severe depression   Psychosocial Evaluation and Intervention:   Psychosocial Re-Evaluation:   Psychosocial Discharge (Final Psychosocial Re-Evaluation):   Vocational Rehabilitation: Provide vocational rehab assistance to qualifying candidates.   Vocational Rehab Evaluation & Intervention: Vocational Rehab - 07/06/18 1002      Initial Vocational Rehab Evaluation & Intervention   Assessment shows need for Vocational Rehabilitation  No       Education: Education Goals: Education classes will be provided  on a weekly basis, covering required topics. Participant will state understanding/return demonstration of topics presented.  Learning Barriers/Preferences: Learning Barriers/Preferences - 07/06/18 1102      Learning Barriers/Preferences   Learning Barriers  Sight    Learning Preferences  Verbal Instruction;Written Material;Individual Instruction       Education Topics: Count Your Pulse:  -Group instruction provided by verbal instruction, demonstration, patient participation and written materials to support subject.  Instructors address importance of being able to find your pulse and how to count your pulse when at home without a heart monitor.  Patients get hands on experience counting their pulse with staff help and individually.   Heart Attack, Angina, and Risk Factor Modification:  -Group instruction provided by verbal instruction, video, and written materials to support subject.  Instructors address signs and symptoms of angina and heart attacks.    Also discuss risk factors for heart disease and how to make changes to improve heart health risk factors.   Functional Fitness:  -Group instruction provided by verbal instruction, demonstration, patient participation, and written materials to support subject.  Instructors address safety measures for doing things around the house.  Discuss how to get up and down off the floor, how to pick things up properly, how to safely get out of a chair without assistance, and balance training.   Meditation and Mindfulness:  -Group instruction provided by verbal instruction, patient participation, and written materials to support subject.  Instructor addresses importance of mindfulness and meditation practice to help reduce stress and improve awareness.  Instructor also leads participants through a meditation exercise.    Stretching for Flexibility and Mobility:  -Group instruction provided by verbal instruction, patient participation, and written  materials to  support subject.  Instructors lead participants through series of stretches that are designed to increase flexibility thus improving mobility.  These stretches are additional exercise for major muscle groups that are typically performed during regular warm up and cool down.   Hands Only CPR:  -Group verbal, video, and participation provides a basic overview of AHA guidelines for community CPR. Role-play of emergencies allow participants the opportunity to practice calling for help and chest compression technique with discussion of AED use.   Hypertension: -Group verbal and written instruction that provides a basic overview of hypertension including the most recent diagnostic guidelines, risk factor reduction with self-care instructions and medication management.    Nutrition I class: Heart Healthy Eating:  -Group instruction provided by PowerPoint slides, verbal discussion, and written materials to support subject matter. The instructor gives an explanation and review of the Therapeutic Lifestyle Changes diet recommendations, which includes a discussion on lipid goals, dietary fat, sodium, fiber, plant stanol/sterol esters, sugar, and the components of a well-balanced, healthy diet.   Nutrition II class: Lifestyle Skills:  -Group instruction provided by PowerPoint slides, verbal discussion, and written materials to support subject matter. The instructor gives an explanation and review of label reading, grocery shopping for heart health, heart healthy recipe modifications, and ways to make healthier choices when eating out.   Diabetes Question & Answer:  -Group instruction provided by PowerPoint slides, verbal discussion, and written materials to support subject matter. The instructor gives an explanation and review of diabetes co-morbidities, pre- and post-prandial blood glucose goals, pre-exercise blood glucose goals, signs, symptoms, and treatment of hypoglycemia and  hyperglycemia, and foot care basics.   Diabetes Blitz:  -Group instruction provided by PowerPoint slides, verbal discussion, and written materials to support subject matter. The instructor gives an explanation and review of the physiology behind type 1 and type 2 diabetes, diabetes medications and rational behind using different medications, pre- and post-prandial blood glucose recommendations and Hemoglobin A1c goals, diabetes diet, and exercise including blood glucose guidelines for exercising safely.    Portion Distortion:  -Group instruction provided by PowerPoint slides, verbal discussion, written materials, and food models to support subject matter. The instructor gives an explanation of serving size versus portion size, changes in portions sizes over the last 20 years, and what consists of a serving from each food group.   Stress Management:  -Group instruction provided by verbal instruction, video, and written materials to support subject matter.  Instructors review role of stress in heart disease and how to cope with stress positively.     Exercising on Your Own:  -Group instruction provided by verbal instruction, power point, and written materials to support subject.  Instructors discuss benefits of exercise, components of exercise, frequency and intensity of exercise, and end points for exercise.  Also discuss use of nitroglycerin and activating EMS.  Review options of places to exercise outside of rehab.  Review guidelines for sex with heart disease.   Cardiac Drugs I:  -Group instruction provided by verbal instruction and written materials to support subject.  Instructor reviews cardiac drug classes: antiplatelets, anticoagulants, beta blockers, and statins.  Instructor discusses reasons, side effects, and lifestyle considerations for each drug class.   Cardiac Drugs II:  -Group instruction provided by verbal instruction and written materials to support subject.  Instructor  reviews cardiac drug classes: angiotensin converting enzyme inhibitors (ACE-I), angiotensin II receptor blockers (ARBs), nitrates, and calcium channel blockers.  Instructor discusses reasons, side effects, and lifestyle considerations for each drug class.  Anatomy and Physiology of the Circulatory System:  Group verbal and written instruction and models provide basic cardiac anatomy and physiology, with the coronary electrical and arterial systems. Review of: AMI, Angina, Valve disease, Heart Failure, Peripheral Artery Disease, Cardiac Arrhythmia, Pacemakers, and the ICD.   Other Education:  -Group or individual verbal, written, or video instructions that support the educational goals of the cardiac rehab program.   Holiday Eating Survival Tips:  -Group instruction provided by PowerPoint slides, verbal discussion, and written materials to support subject matter. The instructor gives patients tips, tricks, and techniques to help them not only survive but enjoy the holidays despite the onslaught of food that accompanies the holidays.   Knowledge Questionnaire Score: Knowledge Questionnaire Score - 07/06/18 1001      Knowledge Questionnaire Score   Pre Score  18/24       Core Components/Risk Factors/Patient Goals at Admission: Personal Goals and Risk Factors at Admission - 07/06/18 1102      Core Components/Risk Factors/Patient Goals on Admission    Weight Management  Yes    Intervention  Weight Management: Develop a combined nutrition and exercise program designed to reach desired caloric intake, while maintaining appropriate intake of nutrient and fiber, sodium and fats, and appropriate energy expenditure required for the weight goal.;Weight Management: Provide education and appropriate resources to help participant work on and attain dietary goals.;Weight Management/Obesity: Establish reasonable short term and long term weight goals.    Admit Weight  189 lb 9.5 oz (86 kg)    Goal  Weight: Short Term  180 lb (81.6 kg)    Goal Weight: Long Term  170 lb (77.1 kg)    Expected Outcomes  Short Term: Continue to assess and modify interventions until short term weight is achieved;Long Term: Adherence to nutrition and physical activity/exercise program aimed toward attainment of established weight goal;Weight Loss: Understanding of general recommendations for a balanced deficit meal plan, which promotes 1-2 lb weight loss per week and includes a negative energy balance of [phone] kcal/d;Understanding recommendations for meals to include 15-35% energy as protein, 25-35% energy from fat, 35-60% energy from carbohydrates, less than 27m of dietary cholesterol, 20-35 gm of total fiber daily;Understanding of distribution of calorie intake throughout the day with the consumption of 4-5 meals/snacks    Hypertension  Yes    Intervention  Provide education on lifestyle modifcations including regular physical activity/exercise, weight management, moderate sodium restriction and increased consumption of fresh fruit, vegetables, and low fat dairy, alcohol moderation, and smoking cessation.;Monitor prescription use compliance.    Expected Outcomes  Short Term: Continued assessment and intervention until BP is < 140/933mHG in hypertensive participants. < 130/8053mG in hypertensive participants with diabetes, heart failure or chronic kidney disease.;Long Term: Maintenance of blood pressure at goal levels.    Lipids  Yes    Intervention  Provide education and support for participant on nutrition & aerobic/resistive exercise along with prescribed medications to achieve LDL <44m17mDL >40mg5m Expected Outcomes  Short Term: Participant states understanding of desired cholesterol values and is compliant with medications prescribed. Participant is following exercise prescription and nutrition guidelines.;Long Term: Cholesterol controlled with medications as prescribed, with individualized exercise RX and  with personalized nutrition plan. Value goals: LDL < 44mg,19m > 40 mg.    Stress  Yes    Intervention  Offer individual and/or small group education and counseling on adjustment to heart disease, stress management and health-related lifestyle change. Teach and support self-help strategies.;Refer participants  experiencing significant psychosocial distress to appropriate mental health specialists for further evaluation and treatment. When possible, include family members and significant others in education/counseling sessions.    Expected Outcomes  Short Term: Participant demonstrates changes in health-related behavior, relaxation and other stress management skills, ability to obtain effective social support, and compliance with psychotropic medications if prescribed.;Long Term: Emotional wellbeing is indicated by absence of clinically significant psychosocial distress or social isolation.       Core Components/Risk Factors/Patient Goals Review:    Core Components/Risk Factors/Patient Goals at Discharge (Final Review):    ITP Comments: ITP Comments    Row Name 07/06/18 1114           ITP Comments  Dr. Fransico Him, Medical Director           Comments: Patient attended orientation from [phone] to [phone] to review rules and guidelines for program. Completed 6 minute walk test, Intitial ITP, and exercise prescription.  VSS. Telemetry-SB/SR.  Asymptomatic.

## 2018-07-10 ENCOUNTER — Encounter (HOSPITAL_COMMUNITY): Payer: Medicare Other

## 2018-07-10 ENCOUNTER — Other Ambulatory Visit: Payer: Self-pay | Admitting: Internal Medicine

## 2018-07-10 ENCOUNTER — Encounter (HOSPITAL_COMMUNITY)
Admission: RE | Admit: 2018-07-10 | Discharge: 2018-07-10 | Disposition: A | Discharge status: Home / Self Care | Payer: Medicare Other | Source: Ambulatory Visit | Attending: Cardiovascular Disease | Admitting: Cardiovascular Disease

## 2018-07-10 DIAGNOSIS — I213 ST elevation (STEMI) myocardial infarction of unspecified site: Secondary | ICD-10-CM | POA: Diagnosis not present

## 2018-07-10 DIAGNOSIS — Z79899 Other long term (current) drug therapy: Secondary | ICD-10-CM | POA: Diagnosis not present

## 2018-07-10 DIAGNOSIS — I10 Essential (primary) hypertension: Secondary | ICD-10-CM | POA: Diagnosis not present

## 2018-07-10 DIAGNOSIS — E785 Hyperlipidemia, unspecified: Secondary | ICD-10-CM | POA: Diagnosis not present

## 2018-07-10 DIAGNOSIS — Z1231 Encounter for screening mammogram for malignant neoplasm of breast: Secondary | ICD-10-CM

## 2018-07-11 NOTE — Progress Notes (Signed)
Daily Session Note  Patient Details  Name: [name] MRN: [id number] Date of Birth: 12-Mar-1951 Referring Provider:   Flowsheet Row CARDIAC REHAB PHASE II ORIENTATION from 07/06/2018 in [place]  Referring Provider  [name] MD       Encounter Date: 07/10/2018  Check In:   Capillary Blood Glucose: No results found for this or any previous visit (from the past 24 hour(s)).    Social History   Tobacco Use  Smoking Status Never Smoker  Smokeless Tobacco Never Used    Goals Met:  Exercise tolerated well  Goals Unmet:  Not Applicable  Comments: Pt started cardiac rehab today.  Pt tolerated light exercise without difficulty. VSS, telemetry-sinus rhythm, , asymptomatic.  Medication list reconciled. Pt denies barriers to medicaiton compliance.  PSYCHOSOCIAL ASSESSMENT:  PHQ-0. Marland Kitchen Pt exhibits positive coping skills, hopeful outlook with supportive family. No psychosocial needs identified at this time, no psychosocial interventions necessary.  t oriented to exercise equipment and routine.    Understanding verbalized. Andi Hence, RN, BSN Cardiac Pulmonary Rehab    Dr. Fransico Him is Medical Director for Cardiac Rehab at [facility].

## 2018-07-12 ENCOUNTER — Encounter (HOSPITAL_COMMUNITY)
Admission: RE | Admit: 2018-07-12 | Discharge: 2018-07-12 | Disposition: A | Discharge status: Home / Self Care | Payer: Medicare Other | Source: Ambulatory Visit | Attending: Cardiovascular Disease | Admitting: Cardiovascular Disease

## 2018-07-12 ENCOUNTER — Encounter (HOSPITAL_COMMUNITY): Payer: Medicare Other

## 2018-07-12 DIAGNOSIS — I213 ST elevation (STEMI) myocardial infarction of unspecified site: Secondary | ICD-10-CM

## 2018-07-12 DIAGNOSIS — I10 Essential (primary) hypertension: Secondary | ICD-10-CM | POA: Diagnosis not present

## 2018-07-12 DIAGNOSIS — Z79899 Other long term (current) drug therapy: Secondary | ICD-10-CM | POA: Diagnosis not present

## 2018-07-12 DIAGNOSIS — E785 Hyperlipidemia, unspecified: Secondary | ICD-10-CM | POA: Diagnosis not present

## 2018-07-14 ENCOUNTER — Encounter (HOSPITAL_COMMUNITY)
Admission: RE | Admit: 2018-07-14 | Discharge: 2018-07-14 | Disposition: A | Discharge status: Home / Self Care | Payer: Medicare Other | Source: Ambulatory Visit | Attending: Cardiovascular Disease | Admitting: Cardiovascular Disease

## 2018-07-14 ENCOUNTER — Encounter (HOSPITAL_COMMUNITY): Payer: Medicare Other

## 2018-07-14 DIAGNOSIS — I10 Essential (primary) hypertension: Secondary | ICD-10-CM | POA: Diagnosis not present

## 2018-07-14 DIAGNOSIS — I213 ST elevation (STEMI) myocardial infarction of unspecified site: Secondary | ICD-10-CM

## 2018-07-14 DIAGNOSIS — Z79899 Other long term (current) drug therapy: Secondary | ICD-10-CM | POA: Diagnosis not present

## 2018-07-14 DIAGNOSIS — E785 Hyperlipidemia, unspecified: Secondary | ICD-10-CM | POA: Diagnosis not present

## 2018-07-17 ENCOUNTER — Encounter (HOSPITAL_COMMUNITY)
Admission: RE | Admit: 2018-07-17 | Discharge: 2018-07-17 | Disposition: A | Discharge status: Home / Self Care | Payer: Medicare Other | Source: Ambulatory Visit | Attending: Cardiovascular Disease | Admitting: Cardiovascular Disease

## 2018-07-17 ENCOUNTER — Encounter (HOSPITAL_COMMUNITY): Payer: Medicare Other

## 2018-07-17 DIAGNOSIS — I213 ST elevation (STEMI) myocardial infarction of unspecified site: Secondary | ICD-10-CM

## 2018-07-17 DIAGNOSIS — I10 Essential (primary) hypertension: Secondary | ICD-10-CM | POA: Diagnosis not present

## 2018-07-17 DIAGNOSIS — Z79899 Other long term (current) drug therapy: Secondary | ICD-10-CM | POA: Diagnosis not present

## 2018-07-17 DIAGNOSIS — E785 Hyperlipidemia, unspecified: Secondary | ICD-10-CM | POA: Diagnosis not present

## 2018-07-19 ENCOUNTER — Encounter (HOSPITAL_COMMUNITY): Payer: Medicare Other

## 2018-07-19 ENCOUNTER — Encounter (HOSPITAL_COMMUNITY)
Admission: RE | Admit: 2018-07-19 | Discharge: 2018-07-19 | Disposition: A | Discharge status: Home / Self Care | Payer: Medicare Other | Source: Ambulatory Visit | Attending: Cardiovascular Disease | Admitting: Cardiovascular Disease

## 2018-07-19 DIAGNOSIS — I10 Essential (primary) hypertension: Secondary | ICD-10-CM | POA: Diagnosis not present

## 2018-07-19 DIAGNOSIS — I213 ST elevation (STEMI) myocardial infarction of unspecified site: Secondary | ICD-10-CM | POA: Diagnosis not present

## 2018-07-19 DIAGNOSIS — Z79899 Other long term (current) drug therapy: Secondary | ICD-10-CM | POA: Diagnosis not present

## 2018-07-19 DIAGNOSIS — E785 Hyperlipidemia, unspecified: Secondary | ICD-10-CM | POA: Diagnosis not present

## 2018-07-21 ENCOUNTER — Encounter (HOSPITAL_COMMUNITY)
Admission: RE | Admit: 2018-07-21 | Discharge: 2018-07-21 | Disposition: A | Discharge status: Home / Self Care | Payer: Medicare Other | Source: Ambulatory Visit | Attending: Cardiovascular Disease | Admitting: Cardiovascular Disease

## 2018-07-21 ENCOUNTER — Encounter (HOSPITAL_COMMUNITY): Payer: Medicare Other

## 2018-07-21 DIAGNOSIS — E785 Hyperlipidemia, unspecified: Secondary | ICD-10-CM | POA: Diagnosis not present

## 2018-07-21 DIAGNOSIS — I213 ST elevation (STEMI) myocardial infarction of unspecified site: Secondary | ICD-10-CM

## 2018-07-21 DIAGNOSIS — Z79899 Other long term (current) drug therapy: Secondary | ICD-10-CM | POA: Diagnosis not present

## 2018-07-21 DIAGNOSIS — I10 Essential (primary) hypertension: Secondary | ICD-10-CM | POA: Diagnosis not present

## 2018-07-24 ENCOUNTER — Encounter (HOSPITAL_COMMUNITY): Payer: Medicare Other

## 2018-07-24 ENCOUNTER — Encounter (HOSPITAL_COMMUNITY)
Admission: RE | Admit: 2018-07-24 | Discharge: 2018-07-24 | Disposition: A | Discharge status: Home / Self Care | Payer: Medicare Other | Source: Ambulatory Visit | Attending: Cardiovascular Disease | Admitting: Cardiovascular Disease

## 2018-07-24 DIAGNOSIS — I213 ST elevation (STEMI) myocardial infarction of unspecified site: Secondary | ICD-10-CM | POA: Diagnosis not present

## 2018-07-24 DIAGNOSIS — Z79899 Other long term (current) drug therapy: Secondary | ICD-10-CM | POA: Diagnosis not present

## 2018-07-24 DIAGNOSIS — E785 Hyperlipidemia, unspecified: Secondary | ICD-10-CM | POA: Diagnosis not present

## 2018-07-24 DIAGNOSIS — I10 Essential (primary) hypertension: Secondary | ICD-10-CM | POA: Diagnosis not present

## 2018-07-26 ENCOUNTER — Encounter (HOSPITAL_COMMUNITY): Payer: Medicare Other

## 2018-07-26 ENCOUNTER — Encounter (HOSPITAL_COMMUNITY)
Admission: RE | Admit: 2018-07-26 | Discharge: 2018-07-26 | Disposition: A | Discharge status: Home / Self Care | Payer: Medicare Other | Source: Ambulatory Visit | Attending: Cardiovascular Disease | Admitting: Cardiovascular Disease

## 2018-07-26 VITALS — Ht 67.0 in | Wt 189.8 lb

## 2018-07-26 DIAGNOSIS — E785 Hyperlipidemia, unspecified: Secondary | ICD-10-CM | POA: Diagnosis not present

## 2018-07-26 DIAGNOSIS — Z79899 Other long term (current) drug therapy: Secondary | ICD-10-CM | POA: Diagnosis not present

## 2018-07-26 DIAGNOSIS — I213 ST elevation (STEMI) myocardial infarction of unspecified site: Secondary | ICD-10-CM | POA: Diagnosis not present

## 2018-07-26 DIAGNOSIS — I10 Essential (primary) hypertension: Secondary | ICD-10-CM | POA: Diagnosis not present

## 2018-07-26 NOTE — Progress Notes (Signed)
[name] [name] 67 y.o. female Nutrition Note Spoke with pt. Nutrition plan and goals reviewed with pt. Pt is following a heart healthy diet. Pt wants to lose wt. Pt has not actively been trying to lose wt. Wt loss tips reviewed, limiting portion size, eating additional non-starchy vegetables, eating frequently across the day, food Journaling, reducing sugar sweetened beverages etc. Pt shared she usually drinks 2 large sweet teas daily, reinforced previous recommendations to decrease sugar sweetened beverages. Pt shared she usually eats 1x a day, out of habit. Eating infrequently started when her husband passed away, denies difficulty obtaining food. Pt is pre- diabetic, last A1c,  6.1. Recommended decreasing sugar sweetened beverages and other refined carbohydrates, and increasing complex carbohydrates. Per discussion, Pt eats out requently, 3x a week. Discussed eating more frequently at home to help control the amount of sodium, and trans/ saturated fat consumed. Pt expressed understanding of the information reviewed via feed back method. Pt aware of nutrition education classes offered and plans on attending nutrition classes.  Lab Results  Component Value Date   HGBA1C 6.1 05/08/2018    Wt Readings from Last 3 Encounters:  07/26/18 189 lb 13.1 oz (86.1 kg)  07/06/18 189 lb 9.5 oz (86 kg)  05/24/18 188 lb (85.3 kg)    Nutrition Diagnosis ? Food-and nutrition-related knowledge deficit related to lack of exposure to information as related to diagnosis of: ? CVD ? Pre-DM   Nutrition Intervention  Pt to identify and limit food sources of saturated fat, trans fat, sodium, and refined carbohydrates  Pt to identify food quantities necessary to achieve weight loss of 6-24 lbs. at graduation from cardiac rehab. Goal wt of 170 lb desired.   Pt to decrease sugar sweetened beverages  Pt to eat more frequently across the day  Pt able to name foods that affect blood glucose.  Goal(s) ? Pt to  describe the potential benefits of adopting a Heart Healthy diet.    Plan:   Pt to attend nutrition classes ? Nutrition I ? Nutrition II ? Portion Distortion   Will provide client-centered nutrition education as part of interdisciplinary care  Monitor and evaluate progress toward nutrition goal with team.    Laurina Bustle, MS, RD, LDN 07/26/2018 8:03 AM

## 2018-07-27 ENCOUNTER — Other Ambulatory Visit: Payer: Self-pay | Admitting: Internal Medicine

## 2018-07-27 ENCOUNTER — Other Ambulatory Visit: Payer: Self-pay | Admitting: Family Medicine

## 2018-07-27 DIAGNOSIS — E039 Hypothyroidism, unspecified: Secondary | ICD-10-CM

## 2018-07-28 ENCOUNTER — Encounter (HOSPITAL_COMMUNITY)
Admission: RE | Admit: 2018-07-28 | Discharge: 2018-07-28 | Disposition: A | Discharge status: Home / Self Care | Payer: Medicare Other | Source: Ambulatory Visit | Attending: Cardiovascular Disease | Admitting: Cardiovascular Disease

## 2018-07-28 ENCOUNTER — Encounter (HOSPITAL_COMMUNITY): Payer: Self-pay

## 2018-07-28 ENCOUNTER — Encounter (HOSPITAL_COMMUNITY): Payer: Medicare Other

## 2018-07-28 DIAGNOSIS — I213 ST elevation (STEMI) myocardial infarction of unspecified site: Secondary | ICD-10-CM

## 2018-07-28 DIAGNOSIS — I10 Essential (primary) hypertension: Secondary | ICD-10-CM | POA: Diagnosis not present

## 2018-07-28 DIAGNOSIS — E785 Hyperlipidemia, unspecified: Secondary | ICD-10-CM | POA: Diagnosis not present

## 2018-07-28 DIAGNOSIS — Z79899 Other long term (current) drug therapy: Secondary | ICD-10-CM | POA: Diagnosis not present

## 2018-08-01 ENCOUNTER — Other Ambulatory Visit: Payer: Self-pay | Admitting: Internal Medicine

## 2018-08-02 ENCOUNTER — Encounter (HOSPITAL_COMMUNITY)
Admission: RE | Admit: 2018-08-02 | Discharge: 2018-08-02 | Disposition: A | Discharge status: Home / Self Care | Payer: Medicare Other | Source: Ambulatory Visit | Attending: Cardiovascular Disease | Admitting: Cardiovascular Disease

## 2018-08-02 ENCOUNTER — Encounter (HOSPITAL_COMMUNITY): Payer: Medicare Other

## 2018-08-02 DIAGNOSIS — I213 ST elevation (STEMI) myocardial infarction of unspecified site: Secondary | ICD-10-CM | POA: Diagnosis not present

## 2018-08-02 DIAGNOSIS — Z79899 Other long term (current) drug therapy: Secondary | ICD-10-CM | POA: Insufficient documentation

## 2018-08-02 DIAGNOSIS — E785 Hyperlipidemia, unspecified: Secondary | ICD-10-CM | POA: Insufficient documentation

## 2018-08-02 DIAGNOSIS — Z79890 Hormone replacement therapy (postmenopausal): Secondary | ICD-10-CM | POA: Insufficient documentation

## 2018-08-02 DIAGNOSIS — I10 Essential (primary) hypertension: Secondary | ICD-10-CM | POA: Diagnosis not present

## 2018-08-02 NOTE — Progress Notes (Signed)
I have reviewed a Home Exercise Prescription with [name] . [name] is currently exercising at home.  The patient was advised to continue walk 5-7 days a week for 30-45 minutes.  Rod Holler and I discussed how to progress their exercise prescription.The patient stated that they understand the exercise prescription. We reviewed exercise guidelines, target heart rate during exercise, RPE Scale, weather conditions, NTG use, endpoints for exercise, warmup and cool down.  Patient is encouraged to come to me with any questions. I will continue to follow up with the patient to assist them with progression and safety.     Carma Lair MS, ACSM CEP 08/02/2018 8:11 AM

## 2018-08-03 NOTE — Progress Notes (Signed)
Cardiac Individual Treatment Plan  Patient Details  Name: [name] MRN: [id number] Date of Birth: Mar 08, 1951 Referring Provider:     CARDIAC REHAB PHASE II ORIENTATION from 07/06/2018 in [facility]  Referring Provider  [name] MD       Initial Encounter Date:    CARDIAC REHAB PHASE II ORIENTATION from 07/06/2018 in [place]  Date  07/06/18      Visit Diagnosis: ST elevation myocardial infarction (STEMI), unspecified artery ([place])  Patient's Home Medications on Admission:  Current Outpatient Medications:  .  benzonatate (TESSALON) 100 MG capsule, Take 1 capsule (100 mg total) by mouth 3 (three) times daily as needed for cough. (Patient not taking: Reported on 06/29/2018), Disp: 20 capsule, Rfl: 0 .  clopidogrel (PLAVIX) 75 MG tablet, Take 1 tablet (75 mg total) by mouth daily with breakfast., Disp: 90 tablet, Rfl: 3 .  fluticasone (FLONASE) 50 MCG/ACT nasal spray, Place 1 spray into both nostrils daily. (Patient not taking: Reported on 06/29/2018), Disp: 16 g, Rfl: 2 .  isosorbide mononitrate (IMDUR) 30 MG 24 hr tablet, Take 1 tablet (30 mg total) by mouth daily., Disp: 90 tablet, Rfl: 3 .  levothyroxine (SYNTHROID, LEVOTHROID) 125 MCG tablet, TAKE 1 TABLET (125 MCG TOTAL) BY MOUTH DAILY BEFORE BREAKFAST., Disp: 90 tablet, Rfl: 0 .  loratadine (CLARITIN) 10 MG tablet, Take 1 tablet (10 mg total) by mouth daily. (Patient not taking: Reported on 06/29/2018), Disp: 30 tablet, Rfl: 3 .  losartan (COZAAR) 25 MG tablet, Take 1 tablet (25 mg total) by mouth daily., Disp: 90 tablet, Rfl: 3 .  losartan-hydrochlorothiazide (HYZAAR) 100-12.5 MG tablet, TAKE 1 TABLET BY MOUTH DAILY., Disp: 90 tablet, Rfl: 1 .  metoprolol succinate (TOPROL-XL) 25 MG 24 hr tablet, Take 1 tablet (25 mg total) by mouth daily., Disp: 90 tablet, Rfl: 3 .  nitroGLYCERIN (NITROSTAT) 0.4 MG SL tablet, Place 1 tablet (0.4 mg total) under the tongue every 5  (five) minutes as needed for chest pain., Disp: 25 tablet, Rfl: 1 .  rosuvastatin (CRESTOR) 10 MG tablet, Take 1 tablet (10 mg total) by mouth daily., Disp: 90 tablet, Rfl: 3 .  Vitamin D, Ergocalciferol, (DRISDOL) 50000 units CAPS capsule, TAKE 1 CAPSULE (50,000 UNITS TOTAL) BY MOUTH EVERY 7 (SEVEN) DAYS., Disp: 12 capsule, Rfl: 0  Past Medical History: Past Medical History:  Diagnosis Date  . Anemia   . Arthritis   . Cholelithiasis   . Chronic back pain   . Constipation   . Esophageal reflux   . Fibromyalgia   . Hyperlipidemia   . Hypertension   . Hypothyroid   . IBS (irritable bowel syndrome)   . Internal hemorrhoids without mention of complication   . Left leg pain   . Personal history of colonic polyps 05/06/2010   ADENOMATOUS POLYP    Tobacco Use: Social History   Tobacco Use  Smoking Status Never Smoker  Smokeless Tobacco Never Used    Labs: Recent Review Flowsheet Data    Labs for ITP Cardiac and Pulmonary Rehab Latest Ref Rng & Units 12/14/2016 07/11/2017 05/08/2018 05/08/2018 06/27/2018   Cholestrol 100 - 199 mg/dL - 146 184 200 109   LDLCALC 0 - 99 mg/dL - 74 110(H) 118(H) 43   LDLDIRECT mg/dL - - - - -   HDL >39 mg/dL - 36.40(L) 43.00 52 43   Trlycerides 0 - 149 mg/dL - 180.0(H) 158.0(H) 148 114   Hemoglobin A1c 4.6 - 6.5 %  6.2 5.9 6.1 - -      Capillary Blood Glucose: No results found for: GLUCAP   Exercise Target Goals: Exercise Program Goal: Individual exercise prescription set using results from initial 6 min walk test and THRR while considering  patient's activity barriers and safety.   Exercise Prescription Goal: Initial exercise prescription builds to 30-45 minutes a day of aerobic activity, 2-3 days per week.  Home exercise guidelines will be given to patient during program as part of exercise prescription that the participant will acknowledge.  Activity Barriers & Risk Stratification: Activity Barriers & Cardiac Risk Stratification - 07/06/18  1050      Activity Barriers & Cardiac Risk Stratification   Activity Barriers  Other (comment)    Comments  Right Knee Pain    Cardiac Risk Stratification  High       6 Minute Walk: 6 Minute Walk    Row Name 07/06/18 1047         6 Minute Walk   Phase  Initial     Distance  1706 feet     Walk Time  6 minutes     # of Rest Breaks  0     MPH  3.23     METS  3.63     RPE  11     Perceived Dyspnea   0     VO2 Peak  12.69     Symptoms  No     Resting HR  58 bpm     Resting BP  114/68     Resting Oxygen Saturation   98 %     Exercise Oxygen Saturation  during 6 min walk  96 %     Max Ex. HR  96 bpm     Max Ex. BP  134/70     2 Minute Post BP  130/76        Oxygen Initial Assessment:   Oxygen Re-Evaluation:   Oxygen Discharge (Final Oxygen Re-Evaluation):   Initial Exercise Prescription: Initial Exercise Prescription - 07/06/18 1000      Date of Initial Exercise RX and Referring Provider   Date  07/06/18    Referring Provider  [name] MD     Expected Discharge Date  09/29/18      Recumbant Bike   Level  2    Watts  40    Minutes  10    METs  3.48      NuStep   Level  3    SPM  75    Minutes  10    METs  2.6      Track   Laps  13    Minutes  10    METs  3.3      Prescription Details   Frequency (times per week)  3x    Duration  Progress to 30 minutes of continuous aerobic without signs/symptoms of physical distress      Intensity   THRR 40-80% of Max Heartrate  61-122    Ratings of Perceived Exertion  11-13    Perceived Dyspnea  0-4      Progression   Progression  Continue progressive overload as per policy without signs/symptoms or physical distress.      Resistance Training   Training Prescription  Yes    Weight  3lbs    Reps  10-15       Perform Capillary Blood Glucose checks as needed.  Exercise Prescription Changes: Exercise Prescription Changes  [place] Name 07/10/18 1300 07/19/18 1503 07/28/18 1444         Response  to Exercise   Blood Pressure (Admit)  122/66  106/70  128/80     Blood Pressure (Exercise)  158/80  168/96  134/70     Blood Pressure (Exit)  120/70  104/60  130/76     Heart Rate (Admit)  80 bpm  77 bpm  86 bpm     Heart Rate (Exercise)  95 bpm  118 bpm  119 bpm     Heart Rate (Exit)  69 bpm  77 bpm  86 bpm     Rating of Perceived Exertion (Exercise)  12  11  11      Perceived Dyspnea (Exercise)  0  0  0     Symptoms  None  None  None     Comments  Pt oriented to exercise equipment  -  -     Duration  Progress to 30 minutes of  aerobic without signs/symptoms of physical distress  Continue with 30 min of aerobic exercise without signs/symptoms of physical distress.  Continue with 30 min of aerobic exercise without signs/symptoms of physical distress.     Intensity  THRR New  THRR unchanged  THRR unchanged       Progression   Progression  Continue to progress workloads to maintain intensity without signs/symptoms of physical distress.  Continue to progress workloads to maintain intensity without signs/symptoms of physical distress.  Continue to progress workloads to maintain intensity without signs/symptoms of physical distress.     Average METs  2.8  3.7  3.78       Resistance Training   Training Prescription  Yes  No  Yes     Weight  3lbs  -  4lbs     Reps  10-15  -  10-15     Time  10 Minutes  -  10 Minutes       Interval Training   Interval Training  -  -  No       Recumbant Bike   Level  2  2  2      Watts  25  25  25      Minutes  10  10  10      METs  2.5  2.5  2.5       NuStep   Level  3  3  5      SPM  80  95  95     Minutes  10  10  10      METs  2.6  3.8  3.6       Track   Laps  12  15  19      Minutes  10  10  10      METs  3.07  3.61  4.29        Exercise Comments: Exercise Comments    Row Name 07/10/18 1402 08/02/18 0812         Exercise Comments  Pt's first day of exercise. Pt oriented to exercise equipment. Pt off to good start with exercise. Will continue  to monitor.   Reviewed HEP with pt. Pt is currently walking daily at home. Pt will continue to exercise on non rehab days. Will follow up with pt regarding home exercise plan.          Exercise Goals and Review: Exercise Goals    Row Name 07/06/18 1050  Exercise Goals   Increase Physical Activity  Yes       Intervention  Provide advice, education, support and counseling about physical activity/exercise needs.;Develop an individualized exercise prescription for aerobic and resistive training based on initial evaluation findings, risk stratification, comorbidities and participant's personal goals.       Expected Outcomes  Short Term: Attend rehab on a regular basis to increase amount of physical activity.;Long Term: Add in home exercise to make exercise part of routine and to increase amount of physical activity.;Long Term: Exercising regularly at least 3-5 days a week.       Increase Strength and Stamina  Yes       Intervention  Provide advice, education, support and counseling about physical activity/exercise needs.;Develop an individualized exercise prescription for aerobic and resistive training based on initial evaluation findings, risk stratification, comorbidities and participant's personal goals.       Expected Outcomes  Short Term: Increase workloads from initial exercise prescription for resistance, speed, and METs.;Short Term: Perform resistance training exercises routinely during rehab and add in resistance training at home;Long Term: Improve cardiorespiratory fitness, muscular endurance and strength as measured by increased METs and functional capacity (6MWT)       Able to understand and use rate of perceived exertion (RPE) scale  Yes       Intervention  Provide education and explanation on how to use RPE scale       Expected Outcomes  Short Term: Able to use RPE daily in rehab to express subjective intensity level;Long Term:  Able to use RPE to guide intensity level when  exercising independently       Knowledge and understanding of Target Heart Rate Range (THRR)  Yes       Intervention  Provide education and explanation of THRR including how the numbers were predicted and where they are located for reference       Expected Outcomes  Short Term: Able to state/look up THRR;Short Term: Able to use daily as guideline for intensity in rehab;Long Term: Able to use THRR to govern intensity when exercising independently       Able to check pulse independently  Yes       Intervention  Provide education and demonstration on how to check pulse in carotid and radial arteries.;Review the importance of being able to check your own pulse for safety during independent exercise       Expected Outcomes  Short Term: Able to explain why pulse checking is important during independent exercise;Long Term: Able to check pulse independently and accurately       Understanding of Exercise Prescription  Yes       Intervention  Provide education, explanation, and written materials on patient's individual exercise prescription       Expected Outcomes  Short Term: Able to explain program exercise prescription;Long Term: Able to explain home exercise prescription to exercise independently          Exercise Goals Re-Evaluation : Exercise Goals Re-Evaluation    Row Name 08/02/18 0813             Exercise Goal Re-Evaluation   Exercise Goals Review  Able to understand and use rate of perceived exertion (RPE) scale;Increase Physical Activity;Knowledge and understanding of Target Heart Rate Range (THRR);Understanding of Exercise Prescription;Increase Strength and Stamina;Able to check pulse independently       Comments  Reviewed HEP with pt. Also reviewed THRR, RPE Scale, weather precautions, endpoints of exericse, NTG use, warmup and cool down.  Expected Outcomes  Pt will continue to walk 5-7 days a week, 30-45 minutes. Pt will continue to increase cardiorespiratory fitness. Will continue  to monitor and progress pt.           Discharge Exercise Prescription (Final Exercise Prescription Changes): Exercise Prescription Changes - 07/28/18 1444      Response to Exercise   Blood Pressure (Admit)  128/80    Blood Pressure (Exercise)  134/70    Blood Pressure (Exit)  130/76    Heart Rate (Admit)  86 bpm    Heart Rate (Exercise)  119 bpm    Heart Rate (Exit)  86 bpm    Rating of Perceived Exertion (Exercise)  11    Perceived Dyspnea (Exercise)  0    Symptoms  None    Duration  Continue with 30 min of aerobic exercise without signs/symptoms of physical distress.    Intensity  THRR unchanged      Progression   Progression  Continue to progress workloads to maintain intensity without signs/symptoms of physical distress.    Average METs  3.78      Resistance Training   Training Prescription  Yes    Weight  4lbs    Reps  10-15    Time  10 Minutes      Interval Training   Interval Training  No      Recumbant Bike   Level  2    Watts  25    Minutes  10    METs  2.5      NuStep   Level  5    SPM  95    Minutes  10    METs  3.6      Track   Laps  19    Minutes  10    METs  4.29       Nutrition:  Target Goals: Understanding of nutrition guidelines, daily intake of sodium <1535m, cholesterol <2076m calories 30% from fat and 7% or less from saturated fats, daily to have 5 or more servings of fruits and vegetables.  Biometrics: Pre Biometrics - 07/06/18 1051      Pre Biometrics   Height  5' 7"  (1.702 m)    Weight  86 kg    Waist Circumference  39 inches    Hip Circumference  42 inches    Waist to Hip Ratio  0.93 %    BMI (Calculated)  29.69    Triceps Skinfold  30 mm    % Body Fat  41.3 %    Grip Strength  36 kg    Flexibility  15.5 in    Single Leg Stand  12.47 seconds        Nutrition Therapy Plan and Nutrition Goals: Nutrition Therapy & Goals - 07/06/18 1022      Nutrition Therapy   Diet  carbohydrate consistent heart healthy       Personal Nutrition Goals   Nutrition Goal  Pt to identify and limit food sources of saturated fat, trans fat, sodium, and refined carbohydrates    Personal Goal #2  Pt to identify food quantities necessary to achieve weight loss of 6-24 lbs. at graduation from cardiac rehab. Goal wt of 170 lb desired.    Personal Goal #3  Pt able to name foods that affect blood glucose.      Intervention Plan   Intervention  Prescribe, educate and counsel regarding individualized specific dietary modifications aiming towards targeted core components such as weight,  hypertension, lipid management, diabetes, heart failure and other comorbidities.    Expected Outcomes  Short Term Goal: Understand basic principles of dietary content, such as calories, fat, sodium, cholesterol and nutrients.       Nutrition Assessments: Nutrition Assessments - 07/26/18 0805      MEDFICTS Scores   Pre Score  12       Nutrition Goals Re-Evaluation: Nutrition Goals Re-Evaluation    [place] Name 07/06/18 1022             Goals   Current Weight  189 lb 9.5 oz (86 kg)          Nutrition Goals Re-Evaluation: Nutrition Goals Re-Evaluation    [place] Name 07/06/18 1022             Goals   Current Weight  189 lb 9.5 oz (86 kg)          Nutrition Goals Discharge (Final Nutrition Goals Re-Evaluation): Nutrition Goals Re-Evaluation - 07/06/18 1022      Goals   Current Weight  189 lb 9.5 oz (86 kg)       Psychosocial: Target Goals: Acknowledge presence or absence of significant depression and/or stress, maximize coping skills, provide positive support system. Participant is able to verbalize types and ability to use techniques and skills needed for reducing stress and depression.  Initial Review & Psychosocial Screening: Initial Psych Review & Screening - 07/06/18 1002      Initial Review   Current issues with  None Identified      Family Dynamics   Good Support System?  Yes   Pt reports that her family is very  supportive of her.     Barriers   Psychosocial barriers to participate in program  There are no identifiable barriers or psychosocial needs.      Screening Interventions   Interventions  Encouraged to exercise       Quality of Life Scores: Quality of Life - 07/06/18 1008      Quality of Life   Select  Quality of Life      Quality of Life Scores   Health/Function Pre  26.39 %    Socioeconomic Pre  24 %    Psych/Spiritual Pre  25.71 %    Family Pre  30 %    GLOBAL Pre  26.17 %      Scores of 19 and below usually indicate a poorer quality of life in these areas.  A difference of  2-3 points is a clinically meaningful difference.  A difference of 2-3 points in the total score of the Quality of Life Index has been associated with significant improvement in overall quality of life, self-image, physical symptoms, and general health in studies assessing change in quality of life.  PHQ-9: Recent Review Flowsheet Data    Depression screen [facility] 2/9 07/11/2018 05/08/2018 07/12/2017 01/01/2015 08/10/2013   Decreased Interest 0 0 0 0 0   Down, Depressed, Hopeless 0 0 0 0 0   PHQ - 2 Score 0 0 0 0 0   Altered sleeping - 0 - - -   Tired, decreased energy - 0 - - -   Change in appetite - 0 - - -   Feeling bad or failure about yourself  - 0 - - -   Trouble concentrating - 0 - - -   Moving slowly or fidgety/restless - 0 - - -   Suicidal thoughts - 0 - - -   PHQ-9 Score - 0 - - -  Difficult doing work/chores - Not difficult at all - - -     Interpretation of Total Score  Total Score Depression Severity:  1-4 = Minimal depression, 5-9 = Mild depression, 10-14 = Moderate depression, 15-19 = Moderately severe depression, 20-27 = Severe depression   Psychosocial Evaluation and Intervention: Psychosocial Evaluation - 07/28/18 1415      Psychosocial Evaluation & Interventions   Interventions  Encouraged to exercise with the program and follow exercise prescription    Comments  No psychosocial  needs identified.     Expected Outcomes  Chloie will maintain a positive outlook.     Continue Psychosocial Services   No Follow up required       Psychosocial Re-Evaluation: Psychosocial Re-Evaluation    [place] Name 07/28/18 1415             Psychosocial Re-Evaluation   Current issues with  None Identified       Comments  No interventions necessary.        Expected Outcomes  Berta will continue to have a positive outlookwith good coping skills.         Interventions  Encouraged to attend Cardiac Rehabilitation for the exercise       Continue Psychosocial Services   No Follow up required          Psychosocial Discharge (Final Psychosocial Re-Evaluation): Psychosocial Re-Evaluation - 07/28/18 1415      Psychosocial Re-Evaluation   Current issues with  None Identified    Comments  No interventions necessary.     Expected Outcomes  Tyeisha will continue to have a positive outlookwith good coping skills.      Interventions  Encouraged to attend Cardiac Rehabilitation for the exercise    Continue Psychosocial Services   No Follow up required       Vocational Rehabilitation: Provide vocational rehab assistance to qualifying candidates.   Vocational Rehab Evaluation & Intervention: Vocational Rehab - 07/06/18 1002      Initial Vocational Rehab Evaluation & Intervention   Assessment shows need for Vocational Rehabilitation  No       Education: Education Goals: Education classes will be provided on a weekly basis, covering required topics. Participant will state understanding/return demonstration of topics presented.  Learning Barriers/Preferences: Learning Barriers/Preferences - 07/06/18 1102      Learning Barriers/Preferences   Learning Barriers  Sight    Learning Preferences  Verbal Instruction;Written Material;Individual Instruction       Education Topics: Count Your Pulse:  -Group instruction provided by verbal instruction, demonstration, patient participation and  written materials to support subject.  Instructors address importance of being able to find your pulse and how to count your pulse when at home without a heart monitor.  Patients get hands on experience counting their pulse with staff help and individually.   Heart Attack, Angina, and Risk Factor Modification:  -Group instruction provided by verbal instruction, video, and written materials to support subject.  Instructors address signs and symptoms of angina and heart attacks.    Also discuss risk factors for heart disease and how to make changes to improve heart health risk factors.   Functional Fitness:  -Group instruction provided by verbal instruction, demonstration, patient participation, and written materials to support subject.  Instructors address safety measures for doing things around the house.  Discuss how to get up and down off the floor, how to pick things up properly, how to safely get out of a chair without assistance, and balance training.   Meditation  and Mindfulness:  -Group instruction provided by verbal instruction, patient participation, and written materials to support subject.  Instructor addresses importance of mindfulness and meditation practice to help reduce stress and improve awareness.  Instructor also leads participants through a meditation exercise.    Stretching for Flexibility and Mobility:  -Group instruction provided by verbal instruction, patient participation, and written materials to support subject.  Instructors lead participants through series of stretches that are designed to increase flexibility thus improving mobility.  These stretches are additional exercise for major muscle groups that are typically performed during regular warm up and cool down.   Hands Only CPR:  -Group verbal, video, and participation provides a basic overview of AHA guidelines for community CPR. Role-play of emergencies allow participants the opportunity to practice calling for  help and chest compression technique with discussion of AED use.   Hypertension: -Group verbal and written instruction that provides a basic overview of hypertension including the most recent diagnostic guidelines, risk factor reduction with self-care instructions and medication management.    Nutrition I class: Heart Healthy Eating:  -Group instruction provided by PowerPoint slides, verbal discussion, and written materials to support subject matter. The instructor gives an explanation and review of the Therapeutic Lifestyle Changes diet recommendations, which includes a discussion on lipid goals, dietary fat, sodium, fiber, plant stanol/sterol esters, sugar, and the components of a well-balanced, healthy diet.   Nutrition II class: Lifestyle Skills:  -Group instruction provided by PowerPoint slides, verbal discussion, and written materials to support subject matter. The instructor gives an explanation and review of label reading, grocery shopping for heart health, heart healthy recipe modifications, and ways to make healthier choices when eating out.   Diabetes Question & Answer:  -Group instruction provided by PowerPoint slides, verbal discussion, and written materials to support subject matter. The instructor gives an explanation and review of diabetes co-morbidities, pre- and post-prandial blood glucose goals, pre-exercise blood glucose goals, signs, symptoms, and treatment of hypoglycemia and hyperglycemia, and foot care basics.   Diabetes Blitz:  -Group instruction provided by PowerPoint slides, verbal discussion, and written materials to support subject matter. The instructor gives an explanation and review of the physiology behind type 1 and type 2 diabetes, diabetes medications and rational behind using different medications, pre- and post-prandial blood glucose recommendations and Hemoglobin A1c goals, diabetes diet, and exercise including blood glucose guidelines for exercising  safely.    Portion Distortion:  -Group instruction provided by PowerPoint slides, verbal discussion, written materials, and food models to support subject matter. The instructor gives an explanation of serving size versus portion size, changes in portions sizes over the last 20 years, and what consists of a serving from each food group.   Stress Management:  -Group instruction provided by verbal instruction, video, and written materials to support subject matter.  Instructors review role of stress in heart disease and how to cope with stress positively.     Exercising on Your Own:  -Group instruction provided by verbal instruction, power point, and written materials to support subject.  Instructors discuss benefits of exercise, components of exercise, frequency and intensity of exercise, and end points for exercise.  Also discuss use of nitroglycerin and activating EMS.  Review options of places to exercise outside of rehab.  Review guidelines for sex with heart disease.   Cardiac Drugs I:  -Group instruction provided by verbal instruction and written materials to support subject.  Instructor reviews cardiac drug classes: antiplatelets, anticoagulants, beta blockers, and statins.  Instructor discusses  reasons, side effects, and lifestyle considerations for each drug class.   Cardiac Drugs II:  -Group instruction provided by verbal instruction and written materials to support subject.  Instructor reviews cardiac drug classes: angiotensin converting enzyme inhibitors (ACE-I), angiotensin II receptor blockers (ARBs), nitrates, and calcium channel blockers.  Instructor discusses reasons, side effects, and lifestyle considerations for each drug class.   Anatomy and Physiology of the Circulatory System:  Group verbal and written instruction and models provide basic cardiac anatomy and physiology, with the coronary electrical and arterial systems. Review of: AMI, Angina, Valve disease, Heart  Failure, Peripheral Artery Disease, Cardiac Arrhythmia, Pacemakers, and the ICD.   Other Education:  -Group or individual verbal, written, or video instructions that support the educational goals of the cardiac rehab program.   Holiday Eating Survival Tips:  -Group instruction provided by PowerPoint slides, verbal discussion, and written materials to support subject matter. The instructor gives patients tips, tricks, and techniques to help them not only survive but enjoy the holidays despite the onslaught of food that accompanies the holidays.   Knowledge Questionnaire Score: Knowledge Questionnaire Score - 07/06/18 1001      Knowledge Questionnaire Score   Pre Score  18/24       Core Components/Risk Factors/Patient Goals at Admission: Personal Goals and Risk Factors at Admission - 07/06/18 1102      Core Components/Risk Factors/Patient Goals on Admission    Weight Management  Yes    Intervention  Weight Management: Develop a combined nutrition and exercise program designed to reach desired caloric intake, while maintaining appropriate intake of nutrient and fiber, sodium and fats, and appropriate energy expenditure required for the weight goal.;Weight Management: Provide education and appropriate resources to help participant work on and attain dietary goals.;Weight Management/Obesity: Establish reasonable short term and long term weight goals.    Admit Weight  189 lb 9.5 oz (86 kg)    Goal Weight: Short Term  180 lb (81.6 kg)    Goal Weight: Long Term  170 lb (77.1 kg)    Expected Outcomes  Short Term: Continue to assess and modify interventions until short term weight is achieved;Long Term: Adherence to nutrition and physical activity/exercise program aimed toward attainment of established weight goal;Weight Loss: Understanding of general recommendations for a balanced deficit meal plan, which promotes 1-2 lb weight loss per week and includes a negative energy balance of [phone]  kcal/d;Understanding recommendations for meals to include 15-35% energy as protein, 25-35% energy from fat, 35-60% energy from carbohydrates, less than 250m of dietary cholesterol, 20-35 gm of total fiber daily;Understanding of distribution of calorie intake throughout the day with the consumption of 4-5 meals/snacks    Hypertension  Yes    Intervention  Provide education on lifestyle modifcations including regular physical activity/exercise, weight management, moderate sodium restriction and increased consumption of fresh fruit, vegetables, and low fat dairy, alcohol moderation, and smoking cessation.;Monitor prescription use compliance.    Expected Outcomes  Short Term: Continued assessment and intervention until BP is < 140/963mHG in hypertensive participants. < 130/8034mG in hypertensive participants with diabetes, heart failure or chronic kidney disease.;Long Term: Maintenance of blood pressure at goal levels.    Lipids  Yes    Intervention  Provide education and support for participant on nutrition & aerobic/resistive exercise along with prescribed medications to achieve LDL <66m39mDL >40mg59m Expected Outcomes  Short Term: Participant states understanding of desired cholesterol values and is compliant with medications prescribed. Participant is following exercise prescription  and nutrition guidelines.;Long Term: Cholesterol controlled with medications as prescribed, with individualized exercise RX and with personalized nutrition plan. Value goals: LDL < 33m, HDL > 40 mg.    Stress  Yes    Intervention  Offer individual and/or small group education and counseling on adjustment to heart disease, stress management and health-related lifestyle change. Teach and support self-help strategies.;Refer participants experiencing significant psychosocial distress to appropriate mental health specialists for further evaluation and treatment. When possible, include family members and significant others in  education/counseling sessions.    Expected Outcomes  Short Term: Participant demonstrates changes in health-related behavior, relaxation and other stress management skills, ability to obtain effective social support, and compliance with psychotropic medications if prescribed.;Long Term: Emotional wellbeing is indicated by absence of clinically significant psychosocial distress or social isolation.       Core Components/Risk Factors/Patient Goals Review:  Goals and Risk Factor Review    Row Name 07/11/18 1524 07/28/18 1416           Core Components/Risk Factors/Patient Goals Review   Personal Goals Review  Weight Management/Obesity;Hypertension;Lipids;Stress  Weight Management/Obesity;Hypertension;Lipids;Stress      Review  pt with multiple CAD RF demonstrates willingness to participate in CR program.   pt personal goal is to lose weight and inches, while getting in better shape physically.  Pt with multiple CAD RF willing to participate in CR program.  RAdellynhas enjoyed coming to the program thus far and feels she has increased her strength.       Expected Outcomes  pt will participate in CR exercise, nutrition and lifestyle modification to decrease overall RF and reach weight loss/functional ability personal goals.   Pt will participate in CR exercise, nutrition, and lifestyle modification opportunities to decrease overall RF.          Core Components/Risk Factors/Patient Goals at Discharge (Final Review):  Goals and Risk Factor Review - 07/28/18 1416      Core Components/Risk Factors/Patient Goals Review   Personal Goals Review  Weight Management/Obesity;Hypertension;Lipids;Stress    Review  Pt with multiple CAD RF willing to participate in CR program.  RJaseniahas enjoyed coming to the program thus far and feels she has increased her strength.     Expected Outcomes  Pt will participate in CR exercise, nutrition, and lifestyle modification opportunities to decrease overall RF.        ITP  Comments: ITP Comments    Row Name 07/06/18 1114 07/11/18 1522 07/28/18 1414       ITP Comments  Dr. TFransico Him Medical Director   pt started group exercise 07/10/2018.  pt tolerated light activity without difficulty. pt oriented to exercise equipment and safety routine.   30 Day ITP Review.  RHalstonhas been enjoying cardiac rehab thus far.  She has a great attendance and feels that she is gaining strength.         Comments: See ITP Comments.

## 2018-08-04 ENCOUNTER — Encounter (HOSPITAL_COMMUNITY)
Admission: RE | Admit: 2018-08-04 | Discharge: 2018-08-04 | Disposition: A | Discharge status: Home / Self Care | Payer: Medicare Other | Source: Ambulatory Visit | Attending: Cardiovascular Disease | Admitting: Cardiovascular Disease

## 2018-08-04 ENCOUNTER — Encounter (HOSPITAL_COMMUNITY): Payer: Medicare Other

## 2018-08-04 DIAGNOSIS — Z79899 Other long term (current) drug therapy: Secondary | ICD-10-CM | POA: Diagnosis not present

## 2018-08-04 DIAGNOSIS — I213 ST elevation (STEMI) myocardial infarction of unspecified site: Secondary | ICD-10-CM

## 2018-08-04 DIAGNOSIS — I10 Essential (primary) hypertension: Secondary | ICD-10-CM | POA: Diagnosis not present

## 2018-08-04 DIAGNOSIS — E785 Hyperlipidemia, unspecified: Secondary | ICD-10-CM | POA: Diagnosis not present

## 2018-08-07 ENCOUNTER — Encounter (HOSPITAL_COMMUNITY): Payer: Medicare Other

## 2018-08-07 ENCOUNTER — Encounter (HOSPITAL_COMMUNITY)
Admission: RE | Admit: 2018-08-07 | Discharge: 2018-08-07 | Disposition: A | Discharge status: Home / Self Care | Payer: Medicare Other | Source: Ambulatory Visit | Attending: Cardiovascular Disease | Admitting: Cardiovascular Disease

## 2018-08-07 DIAGNOSIS — E785 Hyperlipidemia, unspecified: Secondary | ICD-10-CM | POA: Diagnosis not present

## 2018-08-07 DIAGNOSIS — I213 ST elevation (STEMI) myocardial infarction of unspecified site: Secondary | ICD-10-CM

## 2018-08-07 DIAGNOSIS — Z79899 Other long term (current) drug therapy: Secondary | ICD-10-CM | POA: Diagnosis not present

## 2018-08-07 DIAGNOSIS — I10 Essential (primary) hypertension: Secondary | ICD-10-CM | POA: Diagnosis not present

## 2018-08-09 ENCOUNTER — Encounter (HOSPITAL_COMMUNITY)
Admission: RE | Admit: 2018-08-09 | Discharge: 2018-08-09 | Disposition: A | Discharge status: Home / Self Care | Payer: Medicare Other | Source: Ambulatory Visit | Attending: Cardiovascular Disease | Admitting: Cardiovascular Disease

## 2018-08-09 ENCOUNTER — Encounter (HOSPITAL_COMMUNITY): Payer: Medicare Other

## 2018-08-09 DIAGNOSIS — I10 Essential (primary) hypertension: Secondary | ICD-10-CM | POA: Diagnosis not present

## 2018-08-09 DIAGNOSIS — Z79899 Other long term (current) drug therapy: Secondary | ICD-10-CM | POA: Diagnosis not present

## 2018-08-09 DIAGNOSIS — I213 ST elevation (STEMI) myocardial infarction of unspecified site: Secondary | ICD-10-CM | POA: Diagnosis not present

## 2018-08-09 DIAGNOSIS — E785 Hyperlipidemia, unspecified: Secondary | ICD-10-CM | POA: Diagnosis not present

## 2018-08-10 ENCOUNTER — Other Ambulatory Visit: Payer: Self-pay | Admitting: Family Medicine

## 2018-08-11 ENCOUNTER — Encounter (HOSPITAL_COMMUNITY): Payer: Medicare Other

## 2018-08-14 ENCOUNTER — Encounter (HOSPITAL_COMMUNITY): Payer: Medicare Other

## 2018-08-14 ENCOUNTER — Encounter (HOSPITAL_COMMUNITY)
Admission: RE | Admit: 2018-08-14 | Discharge: 2018-08-14 | Disposition: A | Discharge status: Home / Self Care | Payer: Medicare Other | Source: Ambulatory Visit | Attending: Cardiovascular Disease | Admitting: Cardiovascular Disease

## 2018-08-14 DIAGNOSIS — E785 Hyperlipidemia, unspecified: Secondary | ICD-10-CM | POA: Diagnosis not present

## 2018-08-14 DIAGNOSIS — Z79899 Other long term (current) drug therapy: Secondary | ICD-10-CM | POA: Diagnosis not present

## 2018-08-14 DIAGNOSIS — I213 ST elevation (STEMI) myocardial infarction of unspecified site: Secondary | ICD-10-CM

## 2018-08-14 DIAGNOSIS — I10 Essential (primary) hypertension: Secondary | ICD-10-CM | POA: Diagnosis not present

## 2018-08-16 ENCOUNTER — Encounter (HOSPITAL_COMMUNITY): Payer: Medicare Other

## 2018-08-16 ENCOUNTER — Encounter (HOSPITAL_COMMUNITY)
Admission: RE | Admit: 2018-08-16 | Discharge: 2018-08-16 | Disposition: A | Discharge status: Home / Self Care | Payer: Medicare Other | Source: Ambulatory Visit | Attending: Cardiovascular Disease | Admitting: Cardiovascular Disease

## 2018-08-16 DIAGNOSIS — E785 Hyperlipidemia, unspecified: Secondary | ICD-10-CM | POA: Diagnosis not present

## 2018-08-16 DIAGNOSIS — I213 ST elevation (STEMI) myocardial infarction of unspecified site: Secondary | ICD-10-CM

## 2018-08-16 DIAGNOSIS — Z79899 Other long term (current) drug therapy: Secondary | ICD-10-CM | POA: Diagnosis not present

## 2018-08-16 DIAGNOSIS — I10 Essential (primary) hypertension: Secondary | ICD-10-CM | POA: Diagnosis not present

## 2018-08-18 ENCOUNTER — Encounter (HOSPITAL_COMMUNITY): Payer: Medicare Other

## 2018-08-18 ENCOUNTER — Encounter (HOSPITAL_COMMUNITY)
Admission: RE | Admit: 2018-08-18 | Discharge: 2018-08-18 | Disposition: A | Discharge status: Home / Self Care | Payer: Medicare Other | Source: Ambulatory Visit | Attending: Cardiovascular Disease | Admitting: Cardiovascular Disease

## 2018-08-18 DIAGNOSIS — I10 Essential (primary) hypertension: Secondary | ICD-10-CM | POA: Diagnosis not present

## 2018-08-18 DIAGNOSIS — E785 Hyperlipidemia, unspecified: Secondary | ICD-10-CM | POA: Diagnosis not present

## 2018-08-18 DIAGNOSIS — I213 ST elevation (STEMI) myocardial infarction of unspecified site: Secondary | ICD-10-CM | POA: Diagnosis not present

## 2018-08-18 DIAGNOSIS — Z79899 Other long term (current) drug therapy: Secondary | ICD-10-CM | POA: Diagnosis not present

## 2018-08-21 ENCOUNTER — Encounter (HOSPITAL_COMMUNITY): Payer: Medicare Other

## 2018-08-21 ENCOUNTER — Encounter (HOSPITAL_COMMUNITY)
Admission: RE | Admit: 2018-08-21 | Discharge: 2018-08-21 | Disposition: A | Discharge status: Home / Self Care | Payer: Medicare Other | Source: Ambulatory Visit | Attending: Cardiovascular Disease | Admitting: Cardiovascular Disease

## 2018-08-21 DIAGNOSIS — E785 Hyperlipidemia, unspecified: Secondary | ICD-10-CM | POA: Diagnosis not present

## 2018-08-21 DIAGNOSIS — Z79899 Other long term (current) drug therapy: Secondary | ICD-10-CM | POA: Diagnosis not present

## 2018-08-21 DIAGNOSIS — I213 ST elevation (STEMI) myocardial infarction of unspecified site: Secondary | ICD-10-CM | POA: Diagnosis not present

## 2018-08-21 DIAGNOSIS — I10 Essential (primary) hypertension: Secondary | ICD-10-CM | POA: Diagnosis not present

## 2018-08-22 ENCOUNTER — Ambulatory Visit
Admission: RE | Admit: 2018-08-22 | Discharge: 2018-08-22 | Disposition: A | Discharge status: Home / Self Care | Payer: Medicare Other | Source: Ambulatory Visit | Attending: Internal Medicine | Admitting: Internal Medicine

## 2018-08-22 DIAGNOSIS — Z1231 Encounter for screening mammogram for malignant neoplasm of breast: Secondary | ICD-10-CM | POA: Diagnosis not present

## 2018-08-22 LAB — HM MAMMOGRAPHY

## 2018-08-23 ENCOUNTER — Encounter (HOSPITAL_COMMUNITY): Payer: Medicare Other

## 2018-08-23 ENCOUNTER — Encounter (HOSPITAL_COMMUNITY)
Admission: RE | Admit: 2018-08-23 | Discharge: 2018-08-23 | Disposition: A | Discharge status: Home / Self Care | Payer: Medicare Other | Source: Ambulatory Visit | Attending: Cardiovascular Disease | Admitting: Cardiovascular Disease

## 2018-08-23 DIAGNOSIS — Z79899 Other long term (current) drug therapy: Secondary | ICD-10-CM | POA: Diagnosis not present

## 2018-08-23 DIAGNOSIS — I10 Essential (primary) hypertension: Secondary | ICD-10-CM | POA: Diagnosis not present

## 2018-08-23 DIAGNOSIS — E785 Hyperlipidemia, unspecified: Secondary | ICD-10-CM | POA: Diagnosis not present

## 2018-08-23 DIAGNOSIS — I213 ST elevation (STEMI) myocardial infarction of unspecified site: Secondary | ICD-10-CM | POA: Diagnosis not present

## 2018-08-25 ENCOUNTER — Encounter (HOSPITAL_COMMUNITY): Payer: Medicare Other

## 2018-08-25 ENCOUNTER — Encounter (HOSPITAL_COMMUNITY)
Admission: RE | Admit: 2018-08-25 | Discharge: 2018-08-25 | Disposition: A | Discharge status: Home / Self Care | Payer: Medicare Other | Source: Ambulatory Visit | Attending: Cardiovascular Disease | Admitting: Cardiovascular Disease

## 2018-08-25 DIAGNOSIS — I213 ST elevation (STEMI) myocardial infarction of unspecified site: Secondary | ICD-10-CM | POA: Diagnosis not present

## 2018-08-25 DIAGNOSIS — I10 Essential (primary) hypertension: Secondary | ICD-10-CM | POA: Diagnosis not present

## 2018-08-25 DIAGNOSIS — E785 Hyperlipidemia, unspecified: Secondary | ICD-10-CM | POA: Diagnosis not present

## 2018-08-25 DIAGNOSIS — Z79899 Other long term (current) drug therapy: Secondary | ICD-10-CM | POA: Diagnosis not present

## 2018-08-28 ENCOUNTER — Encounter (HOSPITAL_COMMUNITY)
Admission: RE | Admit: 2018-08-28 | Discharge: 2018-08-28 | Disposition: A | Discharge status: Home / Self Care | Payer: Medicare Other | Source: Ambulatory Visit | Attending: Cardiovascular Disease | Admitting: Cardiovascular Disease

## 2018-08-28 ENCOUNTER — Encounter (HOSPITAL_COMMUNITY): Payer: Self-pay

## 2018-08-28 ENCOUNTER — Encounter (HOSPITAL_COMMUNITY): Payer: Medicare Other

## 2018-08-28 DIAGNOSIS — E785 Hyperlipidemia, unspecified: Secondary | ICD-10-CM | POA: Diagnosis not present

## 2018-08-28 DIAGNOSIS — I213 ST elevation (STEMI) myocardial infarction of unspecified site: Secondary | ICD-10-CM | POA: Diagnosis not present

## 2018-08-28 DIAGNOSIS — Z79899 Other long term (current) drug therapy: Secondary | ICD-10-CM | POA: Diagnosis not present

## 2018-08-28 DIAGNOSIS — I10 Essential (primary) hypertension: Secondary | ICD-10-CM | POA: Diagnosis not present

## 2018-08-30 ENCOUNTER — Encounter (HOSPITAL_COMMUNITY): Payer: Medicare Other

## 2018-08-30 ENCOUNTER — Encounter (HOSPITAL_COMMUNITY)
Admission: RE | Admit: 2018-08-30 | Discharge: 2018-08-30 | Disposition: A | Discharge status: Home / Self Care | Payer: Medicare Other | Source: Ambulatory Visit | Attending: Cardiovascular Disease | Admitting: Cardiovascular Disease

## 2018-08-30 DIAGNOSIS — Z79899 Other long term (current) drug therapy: Secondary | ICD-10-CM | POA: Insufficient documentation

## 2018-08-30 DIAGNOSIS — I10 Essential (primary) hypertension: Secondary | ICD-10-CM | POA: Insufficient documentation

## 2018-08-30 DIAGNOSIS — Z79890 Hormone replacement therapy (postmenopausal): Secondary | ICD-10-CM | POA: Diagnosis not present

## 2018-08-30 DIAGNOSIS — I213 ST elevation (STEMI) myocardial infarction of unspecified site: Secondary | ICD-10-CM | POA: Diagnosis not present

## 2018-08-30 DIAGNOSIS — E785 Hyperlipidemia, unspecified: Secondary | ICD-10-CM | POA: Insufficient documentation

## 2018-08-31 DIAGNOSIS — M5440 Lumbago with sciatica, unspecified side: Secondary | ICD-10-CM | POA: Diagnosis not present

## 2018-08-31 DIAGNOSIS — M542 Cervicalgia: Secondary | ICD-10-CM | POA: Diagnosis not present

## 2018-08-31 NOTE — Progress Notes (Signed)
Cardiac Individual Treatment Plan  Patient Details  Name: [name] MRN: [id number] Date of Birth: July 10, 1951 Referring Provider:     CARDIAC REHAB PHASE II ORIENTATION from 07/06/2018 in [place]  Referring Provider  [name] MD       Initial Encounter Date:    CARDIAC REHAB PHASE II ORIENTATION from 07/06/2018 in [place]  Date  07/06/18      Visit Diagnosis: ST elevation myocardial infarction (STEMI), unspecified artery ([place])  Patient's Home Medications on Admission:  Current Outpatient Medications:  .  benzonatate (TESSALON) 100 MG capsule, Take 1 capsule (100 mg total) by mouth 3 (three) times daily as needed for cough. (Patient not taking: Reported on 06/29/2018), Disp: 20 capsule, Rfl: 0 .  clopidogrel (PLAVIX) 75 MG tablet, Take 1 tablet (75 mg total) by mouth daily with breakfast., Disp: 90 tablet, Rfl: 3 .  fluticasone (FLONASE) 50 MCG/ACT nasal spray, Place 1 spray into both nostrils daily. (Patient not taking: Reported on 06/29/2018), Disp: 16 g, Rfl: 2 .  isosorbide mononitrate (IMDUR) 30 MG 24 hr tablet, Take 1 tablet (30 mg total) by mouth daily., Disp: 90 tablet, Rfl: 3 .  levothyroxine (SYNTHROID, LEVOTHROID) 125 MCG tablet, TAKE 1 TABLET (125 MCG TOTAL) BY MOUTH DAILY BEFORE BREAKFAST., Disp: 90 tablet, Rfl: 0 .  levothyroxine (SYNTHROID, LEVOTHROID) 125 MCG tablet, Take 1 tablet (125 mcg total) by mouth daily., Disp: 90 tablet, Rfl: 1 .  loratadine (CLARITIN) 10 MG tablet, Take 1 tablet (10 mg total) by mouth daily. (Patient not taking: Reported on 06/29/2018), Disp: 30 tablet, Rfl: 3 .  losartan (COZAAR) 25 MG tablet, Take 1 tablet (25 mg total) by mouth daily., Disp: 90 tablet, Rfl: 3 .  losartan-hydrochlorothiazide (HYZAAR) 100-12.5 MG tablet, TAKE 1 TABLET BY MOUTH DAILY., Disp: 90 tablet, Rfl: 1 .  metoprolol succinate (TOPROL-XL) 25 MG 24 hr tablet, Take 1 tablet (25 mg total) by mouth daily.,  Disp: 90 tablet, Rfl: 3 .  nitroGLYCERIN (NITROSTAT) 0.4 MG SL tablet, Place 1 tablet (0.4 mg total) under the tongue every 5 (five) minutes as needed for chest pain., Disp: 25 tablet, Rfl: 1 .  rosuvastatin (CRESTOR) 10 MG tablet, Take 1 tablet (10 mg total) by mouth daily., Disp: 90 tablet, Rfl: 3 .  Vitamin D, Ergocalciferol, (DRISDOL) 50000 units CAPS capsule, TAKE 1 CAPSULE (50,000 UNITS TOTAL) BY MOUTH EVERY 7 (SEVEN) DAYS., Disp: 12 capsule, Rfl: 0  Past Medical History: Past Medical History:  Diagnosis Date  . Anemia   . Arthritis   . Cholelithiasis   . Chronic back pain   . Constipation   . Esophageal reflux   . Fibromyalgia   . Hyperlipidemia   . Hypertension   . Hypothyroid   . IBS (irritable bowel syndrome)   . Internal hemorrhoids without mention of complication   . Left leg pain   . Personal history of colonic polyps 05/06/2010   ADENOMATOUS POLYP    Tobacco Use: Social History   Tobacco Use  Smoking Status Never Smoker  Smokeless Tobacco Never Used    Labs: Recent Review Flowsheet Data    Labs for ITP Cardiac and Pulmonary Rehab Latest Ref Rng & Units 12/14/2016 07/11/2017 05/08/2018 05/08/2018 06/27/2018   Cholestrol 100 - 199 mg/dL - 146 184 200 109   LDLCALC 0 - 99 mg/dL - 74 110(H) 118(H) 43   LDLDIRECT mg/dL - - - - -   HDL >39 mg/dL - 36.40(L)  43.00 52 43   Trlycerides 0 - 149 mg/dL - 180.0(H) 158.0(H) 148 114   Hemoglobin A1c 4.6 - 6.5 % 6.2 5.9 6.1 - -      Capillary Blood Glucose: No results found for: GLUCAP   Exercise Target Goals: Exercise Program Goal: Individual exercise prescription set using results from initial 6 min walk test and THRR while considering  patient's activity barriers and safety.   Exercise Prescription Goal: Initial exercise prescription builds to 30-45 minutes a day of aerobic activity, 2-3 days per week.  Home exercise guidelines will be given to patient during program as part of exercise prescription that the  participant will acknowledge.  Activity Barriers & Risk Stratification: Activity Barriers & Cardiac Risk Stratification - 07/06/18 1050      Activity Barriers & Cardiac Risk Stratification   Activity Barriers  Other (comment)    Comments  Right Knee Pain    Cardiac Risk Stratification  High       6 Minute Walk: 6 Minute Walk    Row Name 07/06/18 1047         6 Minute Walk   Phase  Initial     Distance  1706 feet     Walk Time  6 minutes     # of Rest Breaks  0     MPH  3.23     METS  3.63     RPE  11     Perceived Dyspnea   0     VO2 Peak  12.69     Symptoms  No     Resting HR  58 bpm     Resting BP  114/68     Resting Oxygen Saturation   98 %     Exercise Oxygen Saturation  during 6 min walk  96 %     Max Ex. HR  96 bpm     Max Ex. BP  134/70     2 Minute Post BP  130/76        Oxygen Initial Assessment:   Oxygen Re-Evaluation:   Oxygen Discharge (Final Oxygen Re-Evaluation):   Initial Exercise Prescription: Initial Exercise Prescription - 07/06/18 1000      Date of Initial Exercise RX and Referring Provider   Date  07/06/18    Referring Provider  [name] MD     Expected Discharge Date  09/29/18      Recumbant Bike   Level  2    Watts  40    Minutes  10    METs  3.48      NuStep   Level  3    SPM  75    Minutes  10    METs  2.6      Track   Laps  13    Minutes  10    METs  3.3      Prescription Details   Frequency (times per week)  3x    Duration  Progress to 30 minutes of continuous aerobic without signs/symptoms of physical distress      Intensity   THRR 40-80% of Max Heartrate  61-122    Ratings of Perceived Exertion  11-13    Perceived Dyspnea  0-4      Progression   Progression  Continue progressive overload as per policy without signs/symptoms or physical distress.      Resistance Training   Training Prescription  Yes    Weight  3lbs    Reps  10-15       Perform Capillary Blood Glucose checks as  needed.  Exercise Prescription Changes: Exercise Prescription Changes    Row Name 07/10/18 1300 07/19/18 1503 07/28/18 1444 08/16/18 0800 08/30/18 1600     Response to Exercise   Blood Pressure (Admit)  122/66  106/70  128/80  128/86  132/80   Blood Pressure (Exercise)  158/80  168/96  134/70  162/94  180/80 Recheck: 152/82   Blood Pressure (Exit)  120/70  104/60  130/76  120/78  118/72   Heart Rate (Admit)  80 bpm  77 bpm  86 bpm  82 bpm  76 bpm   Heart Rate (Exercise)  95 bpm  118 bpm  119 bpm  131 bpm  127 bpm   Heart Rate (Exit)  69 bpm  77 bpm  86 bpm  81 bpm  87 bpm   Rating of Perceived Exertion (Exercise)  12  11  11  13  12    Perceived Dyspnea (Exercise)  0  0  0  0  0   Symptoms  None  None  None  None   None    Comments  Pt oriented to exercise equipment  -  -  None  -   Duration  Progress to 30 minutes of  aerobic without signs/symptoms of physical distress  Continue with 30 min of aerobic exercise without signs/symptoms of physical distress.  Continue with 30 min of aerobic exercise without signs/symptoms of physical distress.  Continue with 30 min of aerobic exercise without signs/symptoms of physical distress.  Progress to 45 minutes of aerobic exercise without signs/symptoms of physical distress   Intensity  THRR New  THRR unchanged  THRR unchanged  THRR unchanged  THRR unchanged     Progression   Progression  Continue to progress workloads to maintain intensity without signs/symptoms of physical distress.  Continue to progress workloads to maintain intensity without signs/symptoms of physical distress.  Continue to progress workloads to maintain intensity without signs/symptoms of physical distress.  Continue to progress workloads to maintain intensity without signs/symptoms of physical distress.  Continue to progress workloads to maintain intensity without signs/symptoms of physical distress.   Average METs  2.8  3.7  3.78  4.34  4.88     Resistance Training   Training  Prescription  Yes  No  Yes  No  No   Weight  3lbs  -  4lbs  -  -   Reps  10-15  -  10-15  -  -   Time  10 Minutes  -  10 Minutes  -  -     Interval Training   Interval Training  -  -  No  No  -     Recumbant Bike   Level  2  2  2   2.5  2   Watts  25  25  25   -  -   Minutes  10  10  10  10  10    METs  2.5  2.5  2.5  5  5.2     NuStep   Level  3  3  5  5  5    SPM  80  95  95  100  110   Minutes  10  10  10  10  10    METs  2.6  3.8  3.6  4.2  5.5     Track   Laps  12  15  19  16  17   Minutes  10  10  10  10  10    METs  3.07  3.61  4.29  3.76  3.99     Home Exercise Plan   Plans to continue exercise at  -  -  -  Home (comment)  Home (comment)   Frequency  -  -  -  Add 2 additional days to program exercise sessions.  Add 2 additional days to program exercise sessions.   Initial Home Exercises Provided  -  -  -  08/02/18  08/02/18      Exercise Comments: Exercise Comments    Row Name 07/10/18 1402 08/02/18 0812 08/18/18 1524       Exercise Comments  Pt's first day of exercise. Pt oriented to exercise equipment. Pt off to good start with exercise. Will continue to monitor.   Reviewed HEP with pt. Pt is currently walking daily at home. Pt will continue to exercise on non rehab days. Will follow up with pt regarding home exercise plan.   Reviewed METs and Goals with pt. Pt is continuing to respond well to workloads.         Exercise Goals and Review: Exercise Goals    Row Name 07/06/18 1050             Exercise Goals   Increase Physical Activity  Yes       Intervention  Provide advice, education, support and counseling about physical activity/exercise needs.;Develop an individualized exercise prescription for aerobic and resistive training based on initial evaluation findings, risk stratification, comorbidities and participant's personal goals.       Expected Outcomes  Short Term: Attend rehab on a regular basis to increase amount of physical activity.;Long Term: Add in  home exercise to make exercise part of routine and to increase amount of physical activity.;Long Term: Exercising regularly at least 3-5 days a week.       Increase Strength and Stamina  Yes       Intervention  Provide advice, education, support and counseling about physical activity/exercise needs.;Develop an individualized exercise prescription for aerobic and resistive training based on initial evaluation findings, risk stratification, comorbidities and participant's personal goals.       Expected Outcomes  Short Term: Increase workloads from initial exercise prescription for resistance, speed, and METs.;Short Term: Perform resistance training exercises routinely during rehab and add in resistance training at home;Long Term: Improve cardiorespiratory fitness, muscular endurance and strength as measured by increased METs and functional capacity (6MWT)       Able to understand and use rate of perceived exertion (RPE) scale  Yes       Intervention  Provide education and explanation on how to use RPE scale       Expected Outcomes  Short Term: Able to use RPE daily in rehab to express subjective intensity level;Long Term:  Able to use RPE to guide intensity level when exercising independently       Knowledge and understanding of Target Heart Rate Range (THRR)  Yes       Intervention  Provide education and explanation of THRR including how the numbers were predicted and where they are located for reference       Expected Outcomes  Short Term: Able to state/look up THRR;Short Term: Able to use daily as guideline for intensity in rehab;Long Term: Able to use THRR to govern intensity when exercising independently       Able to check pulse independently  Yes  Intervention  Provide education and demonstration on how to check pulse in carotid and radial arteries.;Review the importance of being able to check your own pulse for safety during independent exercise       Expected Outcomes  Short Term: Able to  explain why pulse checking is important during independent exercise;Long Term: Able to check pulse independently and accurately       Understanding of Exercise Prescription  Yes       Intervention  Provide education, explanation, and written materials on patient's individual exercise prescription       Expected Outcomes  Short Term: Able to explain program exercise prescription;Long Term: Able to explain home exercise prescription to exercise independently          Exercise Goals Re-Evaluation : Exercise Goals Re-Evaluation    [place] Name 08/02/18 0813 08/18/18 1525           Exercise Goal Re-Evaluation   Exercise Goals Review  Able to understand and use rate of perceived exertion (RPE) scale;Increase Physical Activity;Knowledge and understanding of Target Heart Rate Range (THRR);Understanding of Exercise Prescription;Increase Strength and Stamina;Able to check pulse independently  Increase Physical Activity;Understanding of Exercise Prescription      Comments  Reviewed HEP with pt. Also reviewed THRR, RPE Scale, weather precautions, endpoints of exericse, NTG use, warmup and cool down.   Pt is responding well to workloads. Pt [place] mall for 20-30 minutes for exercise. Pt used 5lbs weights today for the first time and responded well.       Expected Outcomes  Pt will continue to walk 5-7 days a week, 30-45 minutes. Pt will continue to increase cardiorespiratory fitness. Will continue to monitor and progress pt.   Pt will continue to walk for exercise 5 days a week for 30-70mnutes. Will continue to monitor and progress pt.          Discharge Exercise Prescription (Final Exercise Prescription Changes): Exercise Prescription Changes - 08/30/18 1600      Response to Exercise   Blood Pressure (Admit)  132/80    Blood Pressure (Exercise)  180/80   Recheck: 152/82   Blood Pressure (Exit)  118/72    Heart Rate (Admit)  76 bpm    Heart Rate (Exercise)  127 bpm    Heart Rate  (Exit)  87 bpm    Rating of Perceived Exertion (Exercise)  12    Perceived Dyspnea (Exercise)  0    Symptoms  None     Duration  Progress to 45 minutes of aerobic exercise without signs/symptoms of physical distress    Intensity  THRR unchanged      Progression   Progression  Continue to progress workloads to maintain intensity without signs/symptoms of physical distress.    Average METs  4.88      Resistance Training   Training Prescription  No      Recumbant Bike   Level  2    Minutes  10    METs  5.2      NuStep   Level  5    SPM  110    Minutes  10    METs  5.5      Track   Laps  17    Minutes  10    METs  3.99      Home Exercise Plan   Plans to continue exercise at  Home (comment)    Frequency  Add 2 additional days to program exercise sessions.  Initial Home Exercises Provided  08/02/18       Nutrition:  Target Goals: Understanding of nutrition guidelines, daily intake of sodium <1518m, cholesterol <2047m calories 30% from fat and 7% or less from saturated fats, daily to have 5 or more servings of fruits and vegetables.  Biometrics: Pre Biometrics - 07/06/18 1051      Pre Biometrics   Height  5' 7"  (1.702 m)    Weight  86 kg    Waist Circumference  39 inches    Hip Circumference  42 inches    Waist to Hip Ratio  0.93 %    BMI (Calculated)  29.69    Triceps Skinfold  30 mm    % Body Fat  41.3 %    Grip Strength  36 kg    Flexibility  15.5 in    Single Leg Stand  12.47 seconds        Nutrition Therapy Plan and Nutrition Goals: Nutrition Therapy & Goals - 07/06/18 1022      Nutrition Therapy   Diet  carbohydrate consistent heart healthy      Personal Nutrition Goals   Nutrition Goal  Pt to identify and limit food sources of saturated fat, trans fat, sodium, and refined carbohydrates    Personal Goal #2  Pt to identify food quantities necessary to achieve weight loss of 6-24 lbs. at graduation from cardiac rehab. Goal wt of 170 lb desired.     Personal Goal #3  Pt able to name foods that affect blood glucose.      Intervention Plan   Intervention  Prescribe, educate and counsel regarding individualized specific dietary modifications aiming towards targeted core components such as weight, hypertension, lipid management, diabetes, heart failure and other comorbidities.    Expected Outcomes  Short Term Goal: Understand basic principles of dietary content, such as calories, fat, sodium, cholesterol and nutrients.       Nutrition Assessments: Nutrition Assessments - 07/26/18 0805      MEDFICTS Scores   Pre Score  12       Nutrition Goals Re-Evaluation: Nutrition Goals Re-Evaluation    [place] 07/06/18 1022             Goals   Current Weight  189 lb 9.5 oz (86 kg)          Nutrition Goals Re-Evaluation: Nutrition Goals Re-Evaluation    [place] 07/06/18 1022             Goals   Current Weight  189 lb 9.5 oz (86 kg)          Nutrition Goals Discharge (Final Nutrition Goals Re-Evaluation): Nutrition Goals Re-Evaluation - 07/06/18 1022      Goals   Current Weight  189 lb 9.5 oz (86 kg)       Psychosocial: Target Goals: Acknowledge presence or absence of significant depression and/or stress, maximize coping skills, provide positive support system. Participant is able to verbalize types and ability to use techniques and skills needed for reducing stress and depression.  Initial Review & Psychosocial Screening: Initial Psych Review & Screening - 07/06/18 1002      Initial Review   Current issues with  None Identified      Family Dynamics   Good Support System?  Yes   Pt reports that her family is very supportive of her.     Barriers   Psychosocial barriers to participate in program  There are no identifiable barriers or psychosocial needs.  Screening Interventions   Interventions  Encouraged to exercise       Quality of Life Scores: Quality of Life - 07/06/18 1008      Quality of Life    Select  Quality of Life      Quality of Life Scores   Health/Function Pre  26.39 %    Socioeconomic Pre  24 %    Psych/Spiritual Pre  25.71 %    Family Pre  30 %    GLOBAL Pre  26.17 %      Scores of 19 and below usually indicate a poorer quality of life in these areas.  A difference of  2-3 points is a clinically meaningful difference.  A difference of 2-3 points in the total score of the Quality of Life Index has been associated with significant improvement in overall quality of life, self-image, physical symptoms, and general health in studies assessing change in quality of life.  PHQ-9: Recent Review Flowsheet Data    Depression screen [facility] 2/9 07/11/2018 05/08/2018 07/12/2017 01/01/2015 08/10/2013   Decreased Interest 0 0 0 0 0   Down, Depressed, Hopeless 0 0 0 0 0   PHQ - 2 Score 0 0 0 0 0   Altered sleeping - 0 - - -   Tired, decreased energy - 0 - - -   Change in appetite - 0 - - -   Feeling bad or failure about yourself  - 0 - - -   Trouble concentrating - 0 - - -   Moving slowly or fidgety/restless - 0 - - -   Suicidal thoughts - 0 - - -   PHQ-9 Score - 0 - - -   Difficult doing work/chores - Not difficult at all - - -     Interpretation of Total Score  Total Score Depression Severity:  1-4 = Minimal depression, 5-9 = Mild depression, 10-14 = Moderate depression, 15-19 = Moderately severe depression, 20-27 = Severe depression   Psychosocial Evaluation and Intervention: Psychosocial Evaluation - 07/28/18 1415      Psychosocial Evaluation & Interventions   Interventions  Encouraged to exercise with the program and follow exercise prescription    Comments  No psychosocial needs identified.     Expected Outcomes  Krystalyn will maintain a positive outlook.     Continue Psychosocial Services   No Follow up required       Psychosocial Re-Evaluation: Psychosocial Re-Evaluation    [place] Name 07/28/18 1415 08/28/18 1640           Psychosocial Re-Evaluation   Current issues with   None Identified  None Identified      Comments  No interventions necessary.   No interventions necessary.       Expected Outcomes  Donnia will continue to have a positive outlookwith good coping skills.    Demi will continue to have a positive outlookwith good coping skills.        Interventions  Encouraged to attend Cardiac Rehabilitation for the exercise  Encouraged to attend Cardiac Rehabilitation for the exercise      Continue Psychosocial Services   No Follow up required  No Follow up required         Psychosocial Discharge (Final Psychosocial Re-Evaluation): Psychosocial Re-Evaluation - 08/28/18 1640      Psychosocial Re-Evaluation   Current issues with  None Identified    Comments  No interventions necessary.     Expected Outcomes  Bradleigh will continue to have a positive  outlookwith good coping skills.      Interventions  Encouraged to attend Cardiac Rehabilitation for the exercise    Continue Psychosocial Services   No Follow up required       Vocational Rehabilitation: Provide vocational rehab assistance to qualifying candidates.   Vocational Rehab Evaluation & Intervention: Vocational Rehab - 07/06/18 1002      Initial Vocational Rehab Evaluation & Intervention   Assessment shows need for Vocational Rehabilitation  No       Education: Education Goals: Education classes will be provided on a weekly basis, covering required topics. Participant will state understanding/return demonstration of topics presented.  Learning Barriers/Preferences: Learning Barriers/Preferences - 07/06/18 1102      Learning Barriers/Preferences   Learning Barriers  Sight    Learning Preferences  Verbal Instruction;Written Material;Individual Instruction       Education Topics: Count Your Pulse:  -Group instruction provided by verbal instruction, demonstration, patient participation and written materials to support subject.  Instructors address importance of being able to find your pulse and  how to count your pulse when at home without a heart monitor.  Patients get hands on experience counting their pulse with staff help and individually.   Heart Attack, Angina, and Risk Factor Modification:  -Group instruction provided by verbal instruction, video, and written materials to support subject.  Instructors address signs and symptoms of angina and heart attacks.    Also discuss risk factors for heart disease and how to make changes to improve heart health risk factors.   Functional Fitness:  -Group instruction provided by verbal instruction, demonstration, patient participation, and written materials to support subject.  Instructors address safety measures for doing things around the house.  Discuss how to get up and down off the floor, how to pick things up properly, how to safely get out of a chair without assistance, and balance training.   Meditation and Mindfulness:  -Group instruction provided by verbal instruction, patient participation, and written materials to support subject.  Instructor addresses importance of mindfulness and meditation practice to help reduce stress and improve awareness.  Instructor also leads participants through a meditation exercise.    Stretching for Flexibility and Mobility:  -Group instruction provided by verbal instruction, patient participation, and written materials to support subject.  Instructors lead participants through series of stretches that are designed to increase flexibility thus improving mobility.  These stretches are additional exercise for major muscle groups that are typically performed during regular warm up and cool down.   Hands Only CPR:  -Group verbal, video, and participation provides a basic overview of AHA guidelines for community CPR. Role-play of emergencies allow participants the opportunity to practice calling for help and chest compression technique with discussion of AED use.   Hypertension: -Group verbal and  written instruction that provides a basic overview of hypertension including the most recent diagnostic guidelines, risk factor reduction with self-care instructions and medication management.    Nutrition I class: Heart Healthy Eating:  -Group instruction provided by PowerPoint slides, verbal discussion, and written materials to support subject matter. The instructor gives an explanation and review of the Therapeutic Lifestyle Changes diet recommendations, which includes a discussion on lipid goals, dietary fat, sodium, fiber, plant stanol/sterol esters, sugar, and the components of a well-balanced, healthy diet.   Nutrition II class: Lifestyle Skills:  -Group instruction provided by PowerPoint slides, verbal discussion, and written materials to support subject matter. The instructor gives an explanation and review of label reading, grocery shopping for heart health,  heart healthy recipe modifications, and ways to make healthier choices when eating out.   Diabetes Question & Answer:  -Group instruction provided by PowerPoint slides, verbal discussion, and written materials to support subject matter. The instructor gives an explanation and review of diabetes co-morbidities, pre- and post-prandial blood glucose goals, pre-exercise blood glucose goals, signs, symptoms, and treatment of hypoglycemia and hyperglycemia, and foot care basics.   Diabetes Blitz:  -Group instruction provided by PowerPoint slides, verbal discussion, and written materials to support subject matter. The instructor gives an explanation and review of the physiology behind type 1 and type 2 diabetes, diabetes medications and rational behind using different medications, pre- and post-prandial blood glucose recommendations and Hemoglobin A1c goals, diabetes diet, and exercise including blood glucose guidelines for exercising safely.    Portion Distortion:  -Group instruction provided by PowerPoint slides, verbal discussion,  written materials, and food models to support subject matter. The instructor gives an explanation of serving size versus portion size, changes in portions sizes over the last 20 years, and what consists of a serving from each food group.   Stress Management:  -Group instruction provided by verbal instruction, video, and written materials to support subject matter.  Instructors review role of stress in heart disease and how to cope with stress positively.     Exercising on Your Own:  -Group instruction provided by verbal instruction, power point, and written materials to support subject.  Instructors discuss benefits of exercise, components of exercise, frequency and intensity of exercise, and end points for exercise.  Also discuss use of nitroglycerin and activating EMS.  Review options of places to exercise outside of rehab.  Review guidelines for sex with heart disease.   Cardiac Drugs I:  -Group instruction provided by verbal instruction and written materials to support subject.  Instructor reviews cardiac drug classes: antiplatelets, anticoagulants, beta blockers, and statins.  Instructor discusses reasons, side effects, and lifestyle considerations for each drug class.   Cardiac Drugs II:  -Group instruction provided by verbal instruction and written materials to support subject.  Instructor reviews cardiac drug classes: angiotensin converting enzyme inhibitors (ACE-I), angiotensin II receptor blockers (ARBs), nitrates, and calcium channel blockers.  Instructor discusses reasons, side effects, and lifestyle considerations for each drug class.   Anatomy and Physiology of the Circulatory System:  Group verbal and written instruction and models provide basic cardiac anatomy and physiology, with the coronary electrical and arterial systems. Review of: AMI, Angina, Valve disease, Heart Failure, Peripheral Artery Disease, Cardiac Arrhythmia, Pacemakers, and the ICD.   Other Education:  -Group  or individual verbal, written, or video instructions that support the educational goals of the cardiac rehab program.   Holiday Eating Survival Tips:  -Group instruction provided by PowerPoint slides, verbal discussion, and written materials to support subject matter. The instructor gives patients tips, tricks, and techniques to help them not only survive but enjoy the holidays despite the onslaught of food that accompanies the holidays.   Knowledge Questionnaire Score: Knowledge Questionnaire Score - 07/06/18 1001      Knowledge Questionnaire Score   Pre Score  18/24       Core Components/Risk Factors/Patient Goals at Admission: Personal Goals and Risk Factors at Admission - 07/06/18 1102      Core Components/Risk Factors/Patient Goals on Admission    Weight Management  Yes    Intervention  Weight Management: Develop a combined nutrition and exercise program designed to reach desired caloric intake, while maintaining appropriate intake of nutrient and fiber, sodium  and fats, and appropriate energy expenditure required for the weight goal.;Weight Management: Provide education and appropriate resources to help participant work on and attain dietary goals.;Weight Management/Obesity: Establish reasonable short term and long term weight goals.    Admit Weight  189 lb 9.5 oz (86 kg)    Goal Weight: Short Term  180 lb (81.6 kg)    Goal Weight: Long Term  170 lb (77.1 kg)    Expected Outcomes  Short Term: Continue to assess and modify interventions until short term weight is achieved;Long Term: Adherence to nutrition and physical activity/exercise program aimed toward attainment of established weight goal;Weight Loss: Understanding of general recommendations for a balanced deficit meal plan, which promotes 1-2 lb weight loss per week and includes a negative energy balance of [phone] kcal/d;Understanding recommendations for meals to include 15-35% energy as protein, 25-35% energy from fat, 35-60%  energy from carbohydrates, less than 292m of dietary cholesterol, 20-35 gm of total fiber daily;Understanding of distribution of calorie intake throughout the day with the consumption of 4-5 meals/snacks    Hypertension  Yes    Intervention  Provide education on lifestyle modifcations including regular physical activity/exercise, weight management, moderate sodium restriction and increased consumption of fresh fruit, vegetables, and low fat dairy, alcohol moderation, and smoking cessation.;Monitor prescription use compliance.    Expected Outcomes  Short Term: Continued assessment and intervention until BP is < 140/973mHG in hypertensive participants. < 130/8048mG in hypertensive participants with diabetes, heart failure or chronic kidney disease.;Long Term: Maintenance of blood pressure at goal levels.    Lipids  Yes    Intervention  Provide education and support for participant on nutrition & aerobic/resistive exercise along with prescribed medications to achieve LDL <18m26mDL >40mg39m Expected Outcomes  Short Term: Participant states understanding of desired cholesterol values and is compliant with medications prescribed. Participant is following exercise prescription and nutrition guidelines.;Long Term: Cholesterol controlled with medications as prescribed, with individualized exercise RX and with personalized nutrition plan. Value goals: LDL < 18mg,62m > 40 mg.    Stress  Yes    Intervention  Offer individual and/or small group education and counseling on adjustment to heart disease, stress management and health-related lifestyle change. Teach and support self-help strategies.;Refer participants experiencing significant psychosocial distress to appropriate mental health specialists for further evaluation and treatment. When possible, include family members and significant others in education/counseling sessions.    Expected Outcomes  Short Term: Participant demonstrates changes in health-related  behavior, relaxation and other stress management skills, ability to obtain effective social support, and compliance with psychotropic medications if prescribed.;Long Term: Emotional wellbeing is indicated by absence of clinically significant psychosocial distress or social isolation.       Core Components/Risk Factors/Patient Goals Review:  Goals and Risk Factor Review    Row Name 07/11/18 1524 07/28/18 1416 08/28/18 1640         Core Components/Risk Factors/Patient Goals Review   Personal Goals Review  Weight Management/Obesity;Hypertension;Lipids;Stress  Weight Management/Obesity;Hypertension;Lipids;Stress  Weight Management/Obesity;Hypertension;Lipids;Stress     Review  pt with multiple CAD RF demonstrates willingness to participate in CR program.   pt personal goal is to lose weight and inches, while getting in better shape physically.  Pt with multiple CAD RF willing to participate in CR program.  Niki hIriannanjoyed coming to the program thus far and feels she has increased her strength.   Pt with multiple CAD RF willing to participate in CR program.  Aundria cNecolanues to enjoy CR.  She feels that she is increasing her strength still.      Expected Outcomes  pt will participate in CR exercise, nutrition and lifestyle modification to decrease overall RF and reach weight loss/functional ability personal goals.   Pt will participate in CR exercise, nutrition, and lifestyle modification opportunities to decrease overall RF.   Pt will participate in CR exercise, nutrition, and lifestyle modification opportunities to decrease overall RF.         Core Components/Risk Factors/Patient Goals at Discharge (Final Review):  Goals and Risk Factor Review - 08/28/18 1640      Core Components/Risk Factors/Patient Goals Review   Personal Goals Review  Weight Management/Obesity;Hypertension;Lipids;Stress    Review  Pt with multiple CAD RF willing to participate in CR program.  Oda continues to enjoy CR.  She  feels that she is increasing her strength still.     Expected Outcomes  Pt will participate in CR exercise, nutrition, and lifestyle modification opportunities to decrease overall RF.        ITP Comments: ITP Comments    Row Name 07/06/18 1114 07/11/18 1522 07/28/18 1414 08/28/18 1639     ITP Comments  Dr. Fransico Him, Medical Director   pt started group exercise 07/10/2018.  pt tolerated light activity without difficulty. pt oriented to exercise equipment and safety routine.   30 Day ITP Review.  Juline has been enjoying cardiac rehab thus far.  She has a great attendance and feels that she is gaining strength.   30 Day ITP Review.  Blakley continues to enjoy CR and the socialization.  She has a great attendance and continues to gain strength.        Comments:  See ITP Comments.

## 2018-09-01 ENCOUNTER — Encounter (HOSPITAL_COMMUNITY)
Admission: RE | Admit: 2018-09-01 | Discharge: 2018-09-01 | Disposition: A | Discharge status: Home / Self Care | Payer: Medicare Other | Source: Ambulatory Visit | Attending: Cardiovascular Disease | Admitting: Cardiovascular Disease

## 2018-09-01 ENCOUNTER — Encounter (HOSPITAL_COMMUNITY): Payer: Medicare Other

## 2018-09-01 DIAGNOSIS — I10 Essential (primary) hypertension: Secondary | ICD-10-CM | POA: Diagnosis not present

## 2018-09-01 DIAGNOSIS — E785 Hyperlipidemia, unspecified: Secondary | ICD-10-CM | POA: Diagnosis not present

## 2018-09-01 DIAGNOSIS — I213 ST elevation (STEMI) myocardial infarction of unspecified site: Secondary | ICD-10-CM | POA: Diagnosis not present

## 2018-09-01 DIAGNOSIS — Z79899 Other long term (current) drug therapy: Secondary | ICD-10-CM | POA: Diagnosis not present

## 2018-09-04 ENCOUNTER — Encounter (HOSPITAL_COMMUNITY): Payer: Medicare Other

## 2018-09-04 ENCOUNTER — Encounter (HOSPITAL_COMMUNITY)
Admission: RE | Admit: 2018-09-04 | Discharge: 2018-09-04 | Disposition: A | Discharge status: Home / Self Care | Payer: Medicare Other | Source: Ambulatory Visit | Attending: Cardiovascular Disease | Admitting: Cardiovascular Disease

## 2018-09-04 DIAGNOSIS — Z79899 Other long term (current) drug therapy: Secondary | ICD-10-CM | POA: Diagnosis not present

## 2018-09-04 DIAGNOSIS — I213 ST elevation (STEMI) myocardial infarction of unspecified site: Secondary | ICD-10-CM

## 2018-09-04 DIAGNOSIS — I10 Essential (primary) hypertension: Secondary | ICD-10-CM | POA: Diagnosis not present

## 2018-09-04 DIAGNOSIS — E785 Hyperlipidemia, unspecified: Secondary | ICD-10-CM | POA: Diagnosis not present

## 2018-09-04 NOTE — Progress Notes (Signed)
OUTPATIENT CARDIAC REHAB  Rehab report given for pt to take to Dr. [name] office visit 09/06/2018. Andi Hence, RN, BSN Cardiac Pulmonary Rehab

## 2018-09-06 ENCOUNTER — Ambulatory Visit (INDEPENDENT_AMBULATORY_CARE_PROVIDER_SITE_OTHER): Payer: Medicare Other | Admitting: Cardiovascular Disease

## 2018-09-06 ENCOUNTER — Encounter (HOSPITAL_COMMUNITY): Payer: Medicare Other

## 2018-09-06 ENCOUNTER — Encounter: Payer: Self-pay | Admitting: Cardiovascular Disease

## 2018-09-06 VITALS — BP 132/80 | HR 68 | Ht 67.0 in | Wt 188.0 lb

## 2018-09-06 DIAGNOSIS — I2510 Atherosclerotic heart disease of native coronary artery without angina pectoris: Secondary | ICD-10-CM

## 2018-09-06 DIAGNOSIS — I5181 Takotsubo syndrome: Secondary | ICD-10-CM

## 2018-09-06 NOTE — Patient Instructions (Addendum)
Medication Instructions:  Your provider recommends that you continue on your current medications as directed. Please refer to the Current Medication list given to you today.    Labwork: None  Testing/Procedures: Your provider has requested that you have an echocardiogram. Echocardiography is a painless test that uses sound waves to create images of your heart. It provides your doctor with information about the size and shape of your heart and how well your heart's chambers and valves are working. This procedure takes approximately one hour. There are no restrictions for this procedure. Your echo is scheduled Tuesday, October 15. You will need to arrive by 7:00AM for your 7:30AM appointment.  Follow-Up: You have an appointment with Richardson Dopp on Tuesday, February 27, 2019. Please arrive by 8:00AM.  Any Other Special Instructions Will Be Listed Below (If Applicable).     If you need a refill on your cardiac medications before your next appointment, please call your pharmacy.

## 2018-09-06 NOTE — Progress Notes (Signed)
Cardiology Office Note:    Date:  09/06/2018   ID:  [name], DOB October 29, 1951, MRN [id number]  PCP:  [name], MD  Cardiologist:  [name], MD  Electrophysiologist:  None   Referring MD: [name], MD   Chief Complaint  Patient presents with  . Follow-up    stress cardiomyopathy    History of Present Illness:    [name] is a 67 y.o. female with a hx of coronary artery disease and stress cardiomyopathy.  She presents today for follow-up evaluation.  She presented in June 2019 with acute coronary syndrome and was taken emergently to the cardiac catheterization lab.  This study demonstrated 70% stenosis in the first diagonal, with the branch being too small for PCI.  There is hypokinesis of the anterior and apical myocardium, somewhat out of proportion to the patient's coronary artery disease.  Stress cardiomyopathy was considered.  The patient has recovered well.  She is here alone today and has no specific cardiac-related complaints.  She denies chest pain, chest pressure, or shortness of breath.  She is participating in cardiac rehab and she enjoys this.  Past Medical History:  Diagnosis Date  . Anemia   . Arthritis   . Cholelithiasis   . Chronic back pain   . Constipation   . Esophageal reflux   . Fibromyalgia   . Hyperlipidemia   . Hypertension   . Hypothyroid   . IBS (irritable bowel syndrome)   . Internal hemorrhoids without mention of complication   . Left leg pain   . Personal history of colonic polyps 05/06/2010   ADENOMATOUS POLYP    Past Surgical History:  Procedure Laterality Date  . ABDOMINAL HYSTERECTOMY    . CERVICAL DISC SURGERY     PLATE IN NECK & BACK  . CHOLECYSTECTOMY    . COLONOSCOPY  09/27/2011   NORMAL   . LEFT HEART CATH AND CORONARY ANGIOGRAPHY N/A 05/08/2018   Procedure: LEFT HEART CATH AND CORONARY ANGIOGRAPHY;  Surgeon: Belva Crome, MD;  Location: [facility];  Service: Cardiovascular;  Laterality: N/A;    . LUMBAR [place]    . TONSILLECTOMY      Current Medications: Current Meds  Medication Sig  . clopidogrel (PLAVIX) 75 MG tablet Take 1 tablet (75 mg total) by mouth daily with breakfast.  . fluticasone (FLONASE) 50 MCG/ACT nasal spray Place 1 spray into both nostrils daily.  . isosorbide mononitrate (IMDUR) 30 MG 24 hr tablet Take 1 tablet (30 mg total) by mouth daily.  Marland Kitchen levothyroxine (SYNTHROID, LEVOTHROID) 125 MCG tablet TAKE 1 TABLET (125 MCG TOTAL) BY MOUTH DAILY BEFORE BREAKFAST.  Marland Kitchen loratadine (CLARITIN) 10 MG tablet Take 1 tablet (10 mg total) by mouth daily.  Marland Kitchen losartan (COZAAR) 25 MG tablet Take 1 tablet (25 mg total) by mouth daily.  . metoprolol succinate (TOPROL-XL) 25 MG 24 hr tablet Take 1 tablet (25 mg total) by mouth daily.  . rosuvastatin (CRESTOR) 10 MG tablet Take 1 tablet (10 mg total) by mouth daily.  . Vitamin D, Ergocalciferol, (DRISDOL) 50000 units CAPS capsule TAKE 1 CAPSULE (50,000 UNITS TOTAL) BY MOUTH EVERY 7 (SEVEN) DAYS.     Allergies:   Livalo [pitavastatin]; Aspirin; Gadolinium; Hydrocodone-acetaminophen; Iohexol; and Lipitor [atorvastatin]   Social History   Socioeconomic History  . Marital status: Widowed    Spouse name: Not on file  . Number of children: 2  . Years of education: Not on file  .  Highest education level: Not on file  Occupational History  . Occupation: housewife    Employer: UNEMPLOYED   Social Needs  . Financial resource strain: Not hard at all  . Food insecurity:    Worry: Never true    Inability: Never true  . Transportation needs:    Medical: No    Non-medical: No  Tobacco Use  . Smoking status: Never Smoker  . Smokeless tobacco: Never Used  Substance and Sexual Activity  . Alcohol use: No    Alcohol/week: 0.0 standard drinks  . Drug use: No  . Sexual activity: Not Currently  Lifestyle  . Physical activity:    Days per week: 3 days    Minutes per session: 40 min  . Stress: Not at all  Relationships  .  Social connections:    Talks on phone: More than three times a week    Gets together: More than three times a week    Attends religious service: More than 4 times per year    Active member of club or organization: Yes    Attends meetings of clubs or organizations: More than 4 times per year    Relationship status: Married  Other Topics Concern  . Not on file  Social History Narrative   Daily Caffeine Use     Family History: The patient's family history includes Breast cancer in her cousin and maternal aunt; COPD in her mother; Dementia in her mother; Diabetes in her mother; Heart failure in her father; Hypertension in her mother; Kidney disease in her mother; Lung disease in her mother. There is no history of Colon cancer.  ROS:   Please see the history of present illness.    Positive for excessive sweating.  All other systems reviewed and are negative.  EKGs/Labs/Other Studies Reviewed:    The following studies were reviewed today:  Echo 05-10-2018: Study Conclusions  - Left ventricle: The cavity size was normal. There was mild   concentric hypertrophy. Systolic function was mildly reduced. The   estimated ejection fraction was in the range of 45% to 50%.   Hypokinesis of the apicalanterior and apical myocardium;   consistent with ischemia in the distribution of the left anterior   descending coronary artery. Doppler parameters are consistent   with abnormal left ventricular relaxation (grade 1 diastolic   dysfunction).  Cath 05-08-2018: Conclusion    Acute coronary syndrome with anterior wall motion abnormality and essentially widely patent coronary arteries.  Suspect variant stress cardiomyopathy.  (Cannot totally exclude transient occlusion and recanalization of the first diagonal which is relatively small and has moderate somewhat hazy disease in the mid segment up to 70%.)  Normal left main  LAD is large, wraps around the left ventricular apex, and gives origin to 3  diagonal branches, the second of which is discussed above with mid vessel 70% narrowing in an artery that is less than 2 mm in diameter.  Beyond that there is either a small branch or occlusion of the branch.  Images are ambiguous.  Circumflex is large, first marginal and second marginal widely patent.  Second marginal bifurcates.  Right coronary is tortuous but widely patent.  Abnormal left ventricular wall motion with mid to distal anterior wall akinesis, EF 45%, and elevated LVEDP consistent with acute combined systolic and diastolic heart failure.  RECOMMENDATIONS:   Risk factor modification: Blood pressure 130/80 mmHg or less, LDL cholesterol less than 70, check A1c, and encourage exercise when appropriate.  Add low-dose beta-blocker  IV nitroglycerin and IV heparin x24 hours then wean.  2D Doppler echocardiogram tomorrow.    EKG:  EKG is not ordered today.    Recent Labs: 05/08/2018: ALT 10; TSH 1.73 05/13/2018: BUN 7; Creatinine, Ser 0.80; Potassium 4.0; Sodium 141 05/16/2018: Hemoglobin 12.5; Platelets 383  Recent Lipid Panel    Component Value Date/Time   CHOL 109 06/27/2018 0812   TRIG 114 06/27/2018 0812   HDL 43 06/27/2018 0812   CHOLHDL 2.5 06/27/2018 0812   CHOLHDL 3.8 05/08/2018 1155   VLDL 30 05/08/2018 1155   LDLCALC 43 06/27/2018 0812   LDLDIRECT 88.0 10/19/2016 0844    Physical Exam:    VS:  BP 132/80   Pulse 68   Ht 5' 7"  (1.702 m)   Wt 188 lb (85.3 kg)   BMI 29.44 kg/m     Wt Readings from Last 3 Encounters:  09/06/18 188 lb (85.3 kg)  07/26/18 189 lb 13.1 oz (86.1 kg)  07/06/18 189 lb 9.5 oz (86 kg)     GEN:  Well nourished, well developed in no acute distress HEENT: Normal NECK: No JVD; No carotid bruits LYMPHATICS: No lymphadenopathy CARDIAC: RRR, no murmurs, rubs, gallops RESPIRATORY:  Clear to auscultation without rales, wheezing or rhonchi  ABDOMEN: Soft, non-tender, non-distended MUSCULOSKELETAL:  No edema; No deformity  SKIN:  Warm and dry NEUROLOGIC:  Alert and oriented x 3 PSYCHIATRIC:  Normal affect   ASSESSMENT:    1. Takotsubo cardiomyopathy   2. Coronary artery disease involving native coronary artery of native heart without angina pectoris    PLAN:    In order of problems listed above:  1. The patient appears to be tolerating her medical regimen well.  I reviewed her flowsheets from cardiac rehab today.  She continues on clopidogrel for antiplatelet therapy in the setting of aspirin allergy.  She is treated with a beta-blocker, ARB, and nitrate.  No changes are recommended.  We will update an echocardiogram to reassess LV systolic function.  There is some question about whether she had stress cardiomyopathy or ischemic-related changes.  With stress cardiomyopathy we would expect normalization of LV systolic function. 2. The patient is treated with a statin drug using rosuvastatin 10 mg daily.  Lifestyle modification is discussed.   Medication Adjustments/Labs and Tests Ordered: Current medicines are reviewed at length with the patient today.  Concerns regarding medicines are outlined above.  Orders Placed This Encounter  Procedures  . ECHOCARDIOGRAM COMPLETE   No orders of the defined types were placed in this encounter.   Patient Instructions  Medication Instructions:  Your provider recommends that you continue on your current medications as directed. Please refer to the Current Medication list given to you today.    Labwork: None  Testing/Procedures: Your provider has requested that you have an echocardiogram. Echocardiography is a painless test that uses sound waves to create images of your heart. It provides your doctor with information about the size and shape of your heart and how well your heart's chambers and valves are working. This procedure takes approximately one hour. There are no restrictions for this procedure. Your echo is scheduled Tuesday, October 15. You will need to arrive by  7:00AM for your 7:30AM appointment.  Follow-Up: You have an appointment with Richardson Dopp on Tuesday, February 27, 2019. Please arrive by 8:00AM.  Any Other Special Instructions Will Be Listed Below (If Applicable).     If you need a refill on your cardiac medications before your next appointment, please  call your pharmacy.      Signed, [name], MD  09/06/2018 6:02 PM    [place]

## 2018-09-08 ENCOUNTER — Encounter (HOSPITAL_COMMUNITY): Payer: Medicare Other

## 2018-09-08 ENCOUNTER — Encounter (HOSPITAL_COMMUNITY)
Admission: RE | Admit: 2018-09-08 | Discharge: 2018-09-08 | Disposition: A | Discharge status: Home / Self Care | Payer: Medicare Other | Source: Ambulatory Visit | Attending: Cardiovascular Disease | Admitting: Cardiovascular Disease

## 2018-09-08 DIAGNOSIS — I213 ST elevation (STEMI) myocardial infarction of unspecified site: Secondary | ICD-10-CM | POA: Diagnosis not present

## 2018-09-08 DIAGNOSIS — I10 Essential (primary) hypertension: Secondary | ICD-10-CM | POA: Diagnosis not present

## 2018-09-08 DIAGNOSIS — E785 Hyperlipidemia, unspecified: Secondary | ICD-10-CM | POA: Diagnosis not present

## 2018-09-08 DIAGNOSIS — Z79899 Other long term (current) drug therapy: Secondary | ICD-10-CM | POA: Diagnosis not present

## 2018-09-11 ENCOUNTER — Encounter (HOSPITAL_COMMUNITY)
Admission: RE | Admit: 2018-09-11 | Discharge: 2018-09-11 | Disposition: A | Discharge status: Home / Self Care | Payer: Medicare Other | Source: Ambulatory Visit | Attending: Cardiovascular Disease | Admitting: Cardiovascular Disease

## 2018-09-11 ENCOUNTER — Encounter (HOSPITAL_COMMUNITY): Payer: Medicare Other

## 2018-09-11 DIAGNOSIS — I213 ST elevation (STEMI) myocardial infarction of unspecified site: Secondary | ICD-10-CM | POA: Diagnosis not present

## 2018-09-11 DIAGNOSIS — I10 Essential (primary) hypertension: Secondary | ICD-10-CM | POA: Diagnosis not present

## 2018-09-11 DIAGNOSIS — E785 Hyperlipidemia, unspecified: Secondary | ICD-10-CM | POA: Diagnosis not present

## 2018-09-11 DIAGNOSIS — Z79899 Other long term (current) drug therapy: Secondary | ICD-10-CM | POA: Diagnosis not present

## 2018-09-12 ENCOUNTER — Other Ambulatory Visit: Payer: Self-pay

## 2018-09-12 ENCOUNTER — Ambulatory Visit (HOSPITAL_COMMUNITY): Payer: Medicare Other | Attending: Cardiovascular Disease

## 2018-09-12 DIAGNOSIS — I5181 Takotsubo syndrome: Secondary | ICD-10-CM | POA: Insufficient documentation

## 2018-09-13 ENCOUNTER — Encounter (HOSPITAL_COMMUNITY): Payer: Medicare Other

## 2018-09-15 ENCOUNTER — Encounter (HOSPITAL_COMMUNITY)
Admission: RE | Admit: 2018-09-15 | Discharge: 2018-09-15 | Disposition: A | Discharge status: Home / Self Care | Payer: Medicare Other | Source: Ambulatory Visit | Attending: Cardiovascular Disease | Admitting: Cardiovascular Disease

## 2018-09-15 ENCOUNTER — Encounter (HOSPITAL_COMMUNITY): Payer: Medicare Other

## 2018-09-15 DIAGNOSIS — Z79899 Other long term (current) drug therapy: Secondary | ICD-10-CM | POA: Diagnosis not present

## 2018-09-15 DIAGNOSIS — I213 ST elevation (STEMI) myocardial infarction of unspecified site: Secondary | ICD-10-CM

## 2018-09-15 DIAGNOSIS — I10 Essential (primary) hypertension: Secondary | ICD-10-CM | POA: Diagnosis not present

## 2018-09-15 DIAGNOSIS — E785 Hyperlipidemia, unspecified: Secondary | ICD-10-CM | POA: Diagnosis not present

## 2018-09-18 ENCOUNTER — Encounter (HOSPITAL_COMMUNITY)
Admission: RE | Admit: 2018-09-18 | Discharge: 2018-09-18 | Disposition: A | Discharge status: Home / Self Care | Payer: Medicare Other | Source: Ambulatory Visit | Attending: Cardiovascular Disease | Admitting: Cardiovascular Disease

## 2018-09-18 ENCOUNTER — Encounter (HOSPITAL_COMMUNITY): Payer: Medicare Other

## 2018-09-18 DIAGNOSIS — E785 Hyperlipidemia, unspecified: Secondary | ICD-10-CM | POA: Diagnosis not present

## 2018-09-18 DIAGNOSIS — I213 ST elevation (STEMI) myocardial infarction of unspecified site: Secondary | ICD-10-CM

## 2018-09-18 DIAGNOSIS — I10 Essential (primary) hypertension: Secondary | ICD-10-CM | POA: Diagnosis not present

## 2018-09-18 DIAGNOSIS — Z79899 Other long term (current) drug therapy: Secondary | ICD-10-CM | POA: Diagnosis not present

## 2018-09-20 ENCOUNTER — Encounter (HOSPITAL_COMMUNITY)
Admission: RE | Admit: 2018-09-20 | Discharge: 2018-09-20 | Disposition: A | Discharge status: Home / Self Care | Payer: Medicare Other | Source: Ambulatory Visit | Attending: Cardiovascular Disease | Admitting: Cardiovascular Disease

## 2018-09-20 ENCOUNTER — Encounter (HOSPITAL_COMMUNITY): Payer: Medicare Other

## 2018-09-20 DIAGNOSIS — Z79899 Other long term (current) drug therapy: Secondary | ICD-10-CM | POA: Diagnosis not present

## 2018-09-20 DIAGNOSIS — I10 Essential (primary) hypertension: Secondary | ICD-10-CM | POA: Diagnosis not present

## 2018-09-20 DIAGNOSIS — E785 Hyperlipidemia, unspecified: Secondary | ICD-10-CM | POA: Diagnosis not present

## 2018-09-20 DIAGNOSIS — I213 ST elevation (STEMI) myocardial infarction of unspecified site: Secondary | ICD-10-CM | POA: Diagnosis not present

## 2018-09-21 ENCOUNTER — Encounter (HOSPITAL_COMMUNITY): Payer: Self-pay

## 2018-09-22 ENCOUNTER — Encounter (HOSPITAL_COMMUNITY)
Admission: RE | Admit: 2018-09-22 | Discharge: 2018-09-22 | Disposition: A | Discharge status: Home / Self Care | Payer: Medicare Other | Source: Ambulatory Visit | Attending: Cardiovascular Disease | Admitting: Cardiovascular Disease

## 2018-09-22 ENCOUNTER — Encounter (HOSPITAL_COMMUNITY): Payer: Medicare Other

## 2018-09-22 DIAGNOSIS — I10 Essential (primary) hypertension: Secondary | ICD-10-CM | POA: Diagnosis not present

## 2018-09-22 DIAGNOSIS — Z79899 Other long term (current) drug therapy: Secondary | ICD-10-CM | POA: Diagnosis not present

## 2018-09-22 DIAGNOSIS — E785 Hyperlipidemia, unspecified: Secondary | ICD-10-CM | POA: Diagnosis not present

## 2018-09-22 DIAGNOSIS — I213 ST elevation (STEMI) myocardial infarction of unspecified site: Secondary | ICD-10-CM

## 2018-09-22 NOTE — Progress Notes (Signed)
[name] [address] [place], [place] [address] Phone: [phone] Subjective:   [name], am serving as a scribe for Dr. Hulan Saas.   CC: Knee pain, left thumb pain  TOI:[id number]  [name] is a 67 y.o. female coming in with complaint of knee pian. Has been doing cardiac rehab and her knee is doing well.  Patient has been more active.  Not noticing swelling.  [name] increasing instability.  Also complains of pain in left thumb for past 2 weeks. Pain located at [facility] joint. Pain dull and constant at rest and with use.  Patient states that sometimes can even give her a throbbing sensation at night.  Has not noticed any weakness.  States localized to the thumb joint.  [name] numbness.  Rates the severity pain is 7 out of 10 sometimes low.    Past Medical History:  Diagnosis Date  . Anemia   . Arthritis   . Cholelithiasis   . Chronic back pain   . Constipation   . Esophageal reflux   . Fibromyalgia   . Hyperlipidemia   . Hypertension   . Hypothyroid   . IBS (irritable bowel syndrome)   . Internal hemorrhoids without mention of complication   . Left leg pain   . Personal history of colonic polyps 05/06/2010   ADENOMATOUS POLYP   Past Surgical History:  Procedure Laterality Date  . ABDOMINAL HYSTERECTOMY    . CERVICAL DISC SURGERY     PLATE IN NECK & BACK  . CHOLECYSTECTOMY    . COLONOSCOPY  09/27/2011   NORMAL   . LEFT HEART CATH AND CORONARY ANGIOGRAPHY N/A 05/08/2018   Procedure: LEFT HEART CATH AND CORONARY ANGIOGRAPHY;  Surgeon: Belva Crome, MD;  Location: [facility];  Service: Cardiovascular;  Laterality: N/A;  . LUMBAR [place]    . TONSILLECTOMY     Social History   Socioeconomic History  . Marital status: Widowed    Spouse name: Not on file  . Number of children: 2  . Years of education: Not on file  . Highest education level: Not on file  Occupational History  . Occupation: housewife    Employer:  UNEMPLOYED   Social Needs  . Financial resource strain: Not hard at all  . Food insecurity:    Worry: Never true    Inability: Never true  . Transportation needs:    Medical: [name]    Non-medical: [name]  Tobacco Use  . Smoking status: Never Smoker  . Smokeless tobacco: Never Used  Substance and Sexual Activity  . Alcohol use: [name]    Alcohol/week: 0.0 standard drinks  . Drug use: [name]  . Sexual activity: Not Currently  Lifestyle  . Physical activity:    Days per week: 3 days    Minutes per session: 40 min  . Stress: Not at all  Relationships  . Social connections:    Talks on phone: More than three times a week    Gets together: More than three times a week    Attends religious service: More than 4 times per year    Active member of club or organization: Yes    Attends meetings of clubs or organizations: More than 4 times per year    Relationship status: Married  Other Topics Concern  . Not on file  Social History Narrative   Daily Caffeine Use   Allergies  Allergen Reactions  . Livalo [Pitavastatin] Other (See  Comments)    myalgias  . Aspirin Other (See Comments)    unknown  . Gadolinium Hives    patient unsure of which kind of dye she had a reaction to until reaction to Magnevist - may be allergic to x-ray contrast, Onset Date: [id number]   . Hydrocodone-Acetaminophen   . Iohexol Hives    patient unsure of which kind of dye she had a reaction to until reaction to Magnevist - may be allergic to x-ray contrast, Onset Date: [id number]  . Lipitor [Atorvastatin] Other (See Comments)    Myalgias   Family History  Problem Relation Age of Onset  . Diabetes Mother        aunts and uncles  . Kidney disease Mother        stage 3  . Lung disease Mother        colapsed lung/in hospice  . COPD Mother   . Hypertension Mother   . Dementia Mother   . Breast cancer Maternal Aunt   . Heart failure Father        Mother  . Breast cancer Cousin   . Colon cancer Neg Hx     Current  Outpatient Medications (Endocrine & Metabolic):  .  levothyroxine (SYNTHROID, LEVOTHROID) 125 MCG tablet, TAKE 1 TABLET (125 MCG TOTAL) BY MOUTH DAILY BEFORE BREAKFAST.  Current Outpatient Medications (Cardiovascular):  .  isosorbide mononitrate (IMDUR) 30 MG 24 hr tablet, Take 1 tablet (30 mg total) by mouth daily. Marland Kitchen  losartan (COZAAR) 25 MG tablet, Take 1 tablet (25 mg total) by mouth daily. .  metoprolol succinate (TOPROL-XL) 25 MG 24 hr tablet, Take 1 tablet (25 mg total) by mouth daily. .  nitroGLYCERIN (NITROSTAT) 0.4 MG SL tablet, Place 1 tablet (0.4 mg total) under the tongue every 5 (five) minutes as needed for chest pain. .  rosuvastatin (CRESTOR) 10 MG tablet, Take 1 tablet (10 mg total) by mouth daily.  Current Outpatient Medications (Respiratory):  .  benzonatate (TESSALON) 100 MG capsule, Take 1 capsule (100 mg total) by mouth 3 (three) times daily as needed for cough. .  fluticasone (FLONASE) 50 MCG/ACT nasal spray, Place 1 spray into both nostrils daily. Marland Kitchen  loratadine (CLARITIN) 10 MG tablet, Take 1 tablet (10 mg total) by mouth daily.   Current Outpatient Medications (Hematological):  .  clopidogrel (PLAVIX) 75 MG tablet, Take 1 tablet (75 mg total) by mouth daily with breakfast.  Current Outpatient Medications (Other):  Marland Kitchen  Vitamin D, Ergocalciferol, (DRISDOL) 50000 units CAPS capsule, TAKE 1 CAPSULE (50,000 UNITS TOTAL) BY MOUTH EVERY 7 (SEVEN) DAYS.    Past medical history, social, surgical and family history all reviewed in electronic medical record.  [name] pertanent information unless stated regarding to the chief complaint.   Review of Systems:  [name] headache, visual changes, nausea, vomiting, diarrhea, constipation, dizziness, abdominal pain, skin rash, fevers, chills, night sweats, weight loss, swollen lymph nodes, body aches, joint swelling, chest pain, shortness of breath, mood changes.  Mild positive muscle aches  Objective  Blood pressure 122/68, pulse 65, height  5' 7"  (1.702 m), weight 186 lb (84.4 kg), SpO2 98 %.    General: [name] apparent distress alert and oriented x3 mood and affect normal, dressed appropriately.  HEENT: Pupils equal, extraocular movements intact  Respiratory: Patient's speak in full sentences and does not appear short of breath  Cardiovascular: [name] lower extremity edema, non tender, [name] erythema  Skin: Warm dry intact with [name] signs of infection or rash on extremities or  on axial skeleton.  Abdomen: Soft nontender  Neuro: Cranial nerves II through XII are intact, neurovascularly intact in all extremities with 2+ DTRs and 2+ pulses.  Lymph: [name] lymphadenopathy of posterior or anterior cervical chain or axillae bilaterally.  Gait normal with good balance and coordination.  MSK:  Non tender with full range of motion and good stability and symmetric strength and tone of shoulders, elbows, wrist, hip, and ankles bilaterally.  Mild arthritic changes of multiple joints Still some instability noted at the knees bilaterally.  Normal CMC joint but does have a positive grind.  [name] thenar eminence wasting noted.  Procedure: Real-time Ultrasound Guided Injection of left CMC joint Device: GE Logiq Q7 Ultrasound guided injection is preferred based studies that show increased duration, increased effect, greater accuracy, decreased procedural pain, increased response rate, and decreased cost with ultrasound guided versus blind injection.  Verbal informed consent obtained.  Time-out conducted.  Noted [name] overlying erythema, induration, or other signs of local infection.  Skin prepped in a sterile fashion.  Local anesthesia: Topical Ethyl chloride.  With sterile technique and under real time ultrasound guidance: With a 25-gauge 1 inch needle patient was injected with 0.5 cc of 0.5% Marcaine and 0.5 cc of Kenalog 40 mg/mL Completed without difficulty  Pain immediately resolved suggesting accurate placement of the medication.  Advised to call if  fevers/chills, erythema, induration, drainage, or persistent bleeding.  Images permanently stored and available for review in the ultrasound unit.  Impression: Technically successful ultrasound guided injection.    Impression and Recommendations:     This case required medical decision making of moderate complexity. The above documentation has been reviewed and is accurate and complete Emily Pulley, DO       Note: This dictation was prepared with Dragon dictation along with smaller phrase technology. Any transcriptional errors that result from this process are unintentional.

## 2018-09-25 ENCOUNTER — Encounter (HOSPITAL_COMMUNITY): Payer: Medicare Other

## 2018-09-25 ENCOUNTER — Ambulatory Visit (INDEPENDENT_AMBULATORY_CARE_PROVIDER_SITE_OTHER): Payer: Medicare Other | Admitting: Family Medicine

## 2018-09-25 ENCOUNTER — Ambulatory Visit: Payer: Self-pay

## 2018-09-25 VITALS — BP 122/68 | HR 65 | Ht 67.0 in | Wt 186.0 lb

## 2018-09-25 DIAGNOSIS — M79645 Pain in left finger(s): Principal | ICD-10-CM

## 2018-09-25 DIAGNOSIS — M1712 Unilateral primary osteoarthritis, left knee: Secondary | ICD-10-CM

## 2018-09-25 DIAGNOSIS — G8929 Other chronic pain: Secondary | ICD-10-CM

## 2018-09-25 DIAGNOSIS — M1812 Unilateral primary osteoarthritis of first carpometacarpal joint, left hand: Secondary | ICD-10-CM

## 2018-09-25 NOTE — Assessment & Plan Note (Signed)
Doing well nearly 4 months after injection.  Continue to monitor.

## 2018-09-25 NOTE — Patient Instructions (Addendum)
Great to see you  [name] is your friend pennsaid pinkie amount topically 2 times daily as needed.  Injected the thumb joint today  Watch heavy lifting.  Make an appointment with me in 6 weeks but if not perfect

## 2018-09-25 NOTE — Assessment & Plan Note (Signed)
CMC arthritis.  Discussed icing regimen and exercise.  Discussed which activities to do which wants to avoid.  Discussed potential bracing at night.  Topical anti-inflammatories given.  Follow-up again in 4 to 6 weeks

## 2018-09-27 ENCOUNTER — Encounter (HOSPITAL_COMMUNITY): Payer: Medicare Other

## 2018-09-27 ENCOUNTER — Encounter (HOSPITAL_COMMUNITY)
Admission: RE | Admit: 2018-09-27 | Discharge: 2018-09-27 | Disposition: A | Discharge status: Home / Self Care | Payer: Medicare Other | Source: Ambulatory Visit | Attending: Cardiovascular Disease | Admitting: Cardiovascular Disease

## 2018-09-27 VITALS — Ht 67.0 in | Wt 187.2 lb

## 2018-09-27 DIAGNOSIS — I10 Essential (primary) hypertension: Secondary | ICD-10-CM | POA: Diagnosis not present

## 2018-09-27 DIAGNOSIS — I213 ST elevation (STEMI) myocardial infarction of unspecified site: Secondary | ICD-10-CM | POA: Diagnosis not present

## 2018-09-27 DIAGNOSIS — Z79899 Other long term (current) drug therapy: Secondary | ICD-10-CM | POA: Diagnosis not present

## 2018-09-27 DIAGNOSIS — E785 Hyperlipidemia, unspecified: Secondary | ICD-10-CM | POA: Diagnosis not present

## 2018-09-28 NOTE — Progress Notes (Signed)
Cardiac Individual Treatment Plan  Patient Details  Name: [name] MRN: [id number] Date of Birth: 1951/04/28 Referring Provider:     CARDIAC REHAB PHASE II ORIENTATION from 07/06/2018 in [place]  Referring Provider  [name] MD       Initial Encounter Date:    CARDIAC REHAB PHASE II ORIENTATION from 07/06/2018 in [place]  Date  07/06/18      Visit Diagnosis: ST elevation myocardial infarction (STEMI), unspecified artery ([place])  Patient's Home Medications on Admission:  Current Outpatient Medications:  .  benzonatate (TESSALON) 100 MG capsule, Take 1 capsule (100 mg total) by mouth 3 (three) times daily as needed for cough., Disp: 20 capsule, Rfl: 0 .  clopidogrel (PLAVIX) 75 MG tablet, Take 1 tablet (75 mg total) by mouth daily with breakfast., Disp: 90 tablet, Rfl: 3 .  fluticasone (FLONASE) 50 MCG/ACT nasal spray, Place 1 spray into both nostrils daily., Disp: 16 g, Rfl: 2 .  isosorbide mononitrate (IMDUR) 30 MG 24 hr tablet, Take 1 tablet (30 mg total) by mouth daily., Disp: 90 tablet, Rfl: 3 .  levothyroxine (SYNTHROID, LEVOTHROID) 125 MCG tablet, TAKE 1 TABLET (125 MCG TOTAL) BY MOUTH DAILY BEFORE BREAKFAST., Disp: 90 tablet, Rfl: 0 .  loratadine (CLARITIN) 10 MG tablet, Take 1 tablet (10 mg total) by mouth daily., Disp: 30 tablet, Rfl: 3 .  losartan (COZAAR) 25 MG tablet, Take 1 tablet (25 mg total) by mouth daily., Disp: 90 tablet, Rfl: 3 .  metoprolol succinate (TOPROL-XL) 25 MG 24 hr tablet, Take 1 tablet (25 mg total) by mouth daily., Disp: 90 tablet, Rfl: 3 .  nitroGLYCERIN (NITROSTAT) 0.4 MG SL tablet, Place 1 tablet (0.4 mg total) under the tongue every 5 (five) minutes as needed for chest pain., Disp: 25 tablet, Rfl: 1 .  rosuvastatin (CRESTOR) 10 MG tablet, Take 1 tablet (10 mg total) by mouth daily., Disp: 90 tablet, Rfl: 3 .  Vitamin D, Ergocalciferol, (DRISDOL) 50000 units CAPS capsule, TAKE 1  CAPSULE (50,000 UNITS TOTAL) BY MOUTH EVERY 7 (SEVEN) DAYS., Disp: 12 capsule, Rfl: 0  Past Medical History: Past Medical History:  Diagnosis Date  . Anemia   . Arthritis   . Cholelithiasis   . Chronic back pain   . Constipation   . Esophageal reflux   . Fibromyalgia   . Hyperlipidemia   . Hypertension   . Hypothyroid   . IBS (irritable bowel syndrome)   . Internal hemorrhoids without mention of complication   . Left leg pain   . Personal history of colonic polyps 05/06/2010   ADENOMATOUS POLYP    Tobacco Use: Social History   Tobacco Use  Smoking Status Never Smoker  Smokeless Tobacco Never Used    Labs: Recent Review Flowsheet Data    Labs for ITP Cardiac and Pulmonary Rehab Latest Ref Rng & Units 12/14/2016 07/11/2017 05/08/2018 05/08/2018 06/27/2018   Cholestrol 100 - 199 mg/dL - 146 184 200 109   LDLCALC 0 - 99 mg/dL - 74 110(H) 118(H) 43   LDLDIRECT mg/dL - - - - -   HDL >39 mg/dL - 36.40(L) 43.00 52 43   Trlycerides 0 - 149 mg/dL - 180.0(H) 158.0(H) 148 114   Hemoglobin A1c 4.6 - 6.5 % 6.2 5.9 6.1 - -      Capillary Blood Glucose: No results found for: GLUCAP   Exercise Target Goals: Exercise Program Goal: Individual exercise prescription set using results from initial 6  min walk test and THRR while considering  patient's activity barriers and safety.   Exercise Prescription Goal: Initial exercise prescription builds to 30-45 minutes a day of aerobic activity, 2-3 days per week.  Home exercise guidelines will be given to patient during program as part of exercise prescription that the participant will acknowledge.  Activity Barriers & Risk Stratification: Activity Barriers & Cardiac Risk Stratification - 07/06/18 1050      Activity Barriers & Cardiac Risk Stratification   Activity Barriers  Other (comment)    Comments  Right Knee Pain    Cardiac Risk Stratification  High       6 Minute Walk: 6 Minute Walk    Row Name 07/06/18 1047 09/27/18 0810        6 Minute Walk   Phase  Initial  Discharge    Distance  1706 feet  1908 feet    Distance % Change  -  11.84 %    Distance Feet Change  -  202 ft    Walk Time  6 minutes  6 minutes    # of Rest Breaks  0  0    MPH  3.23  3.61    METS  3.63  4.49    RPE  11  11    Perceived Dyspnea   0  0    VO2 Peak  12.69  15.72    Symptoms  No  No    Resting HR  58 bpm  90 bpm    Resting BP  114/68  124/70    Resting Oxygen Saturation   98 %  -    Exercise Oxygen Saturation  during 6 min walk  96 %  -    Max Ex. HR  96 bpm  137 bpm    Max Ex. BP  134/70  142/84    2 Minute Post BP  130/76  118/70       Oxygen Initial Assessment:   Oxygen Re-Evaluation:   Oxygen Discharge (Final Oxygen Re-Evaluation):   Initial Exercise Prescription: Initial Exercise Prescription - 07/06/18 1000      Date of Initial Exercise RX and Referring Provider   Date  07/06/18    Referring Provider  [name] MD     Expected Discharge Date  09/29/18      Recumbant Bike   Level  2    Watts  40    Minutes  10    METs  3.48      NuStep   Level  3    SPM  75    Minutes  10    METs  2.6      Track   Laps  13    Minutes  10    METs  3.3      Prescription Details   Frequency (times per week)  3x    Duration  Progress to 30 minutes of continuous aerobic without signs/symptoms of physical distress      Intensity   THRR 40-80% of Max Heartrate  61-122    Ratings of Perceived Exertion  11-13    Perceived Dyspnea  0-4      Progression   Progression  Continue progressive overload as per policy without signs/symptoms or physical distress.      Resistance Training   Training Prescription  Yes    Weight  3lbs    Reps  10-15       Perform Capillary Blood Glucose checks as needed.  Exercise Prescription Changes: Exercise Prescription Changes    Row Name 07/10/18 1300 07/19/18 1503 07/28/18 1444 08/16/18 0800 08/30/18 1600     Response to Exercise   Blood Pressure (Admit)  122/66  106/70   128/80  128/86  132/80   Blood Pressure (Exercise)  158/80  168/96  134/70  162/94  180/80 Recheck: 152/82   Blood Pressure (Exit)  120/70  104/60  130/76  120/78  118/72   Heart Rate (Admit)  80 bpm  77 bpm  86 bpm  82 bpm  76 bpm   Heart Rate (Exercise)  95 bpm  118 bpm  119 bpm  131 bpm  127 bpm   Heart Rate (Exit)  69 bpm  77 bpm  86 bpm  81 bpm  87 bpm   Rating of Perceived Exertion (Exercise)  12  11  11  13  12    Perceived Dyspnea (Exercise)  0  0  0  0  0   Symptoms  None  None  None  None   None    Comments  Pt oriented to exercise equipment  -  -  None  -   Duration  Progress to 30 minutes of  aerobic without signs/symptoms of physical distress  Continue with 30 min of aerobic exercise without signs/symptoms of physical distress.  Continue with 30 min of aerobic exercise without signs/symptoms of physical distress.  Continue with 30 min of aerobic exercise without signs/symptoms of physical distress.  Progress to 45 minutes of aerobic exercise without signs/symptoms of physical distress   Intensity  THRR New  THRR unchanged  THRR unchanged  THRR unchanged  THRR unchanged     Progression   Progression  Continue to progress workloads to maintain intensity without signs/symptoms of physical distress.  Continue to progress workloads to maintain intensity without signs/symptoms of physical distress.  Continue to progress workloads to maintain intensity without signs/symptoms of physical distress.  Continue to progress workloads to maintain intensity without signs/symptoms of physical distress.  Continue to progress workloads to maintain intensity without signs/symptoms of physical distress.   Average METs  2.8  3.7  3.78  4.34  4.88     Resistance Training   Training Prescription  Yes  No  Yes  No  No   Weight  3lbs  -  4lbs  -  -   Reps  10-15  -  10-15  -  -   Time  10 Minutes  -  10 Minutes  -  -     Interval Training   Interval Training  -  -  No  No  -     Recumbant Bike   Level   2  2  2   2.5  2   Watts  25  25  25   -  -   Minutes  10  10  10  10  10    METs  2.5  2.5  2.5  5  5.2     NuStep   Level  3  3  5  5  5    SPM  80  95  95  100  110   Minutes  10  10  10  10  10    METs  2.6  3.8  3.6  4.2  5.5     Track   Laps  12  15  19  16  17    Minutes  10  10  10   10  10   METs  3.07  3.61  4.29  3.76  3.99     Home Exercise Plan   Plans to continue exercise at  -  -  -  Home (comment)  Home (comment)   Frequency  -  -  -  Add 2 additional days to program exercise sessions.  Add 2 additional days to program exercise sessions.   Initial Home Exercises Provided  -  -  -  08/02/18  08/02/18   Row Name 09/11/18 1600 09/22/18 0848           Response to Exercise   Blood Pressure (Admit)  122/80  118/80      Blood Pressure (Exercise)  162/84  132/68      Blood Pressure (Exit)  104/60  102/78      Heart Rate (Admit)  85 bpm  84 bpm      Heart Rate (Exercise)  132 bpm  120 bpm      Heart Rate (Exit)  91 bpm  80 bpm      Rating of Perceived Exertion (Exercise)  12  12      Perceived Dyspnea (Exercise)  0  0      Symptoms  None  None      Comments  None  None      Duration  Progress to 45 minutes of aerobic exercise without signs/symptoms of physical distress  Progress to 45 minutes of aerobic exercise without signs/symptoms of physical distress      Intensity  THRR unchanged  THRR unchanged        Progression   Progression  Continue to progress workloads to maintain intensity without signs/symptoms of physical distress.  Continue to progress workloads to maintain intensity without signs/symptoms of physical distress.      Average METs  4.73  4.53        Resistance Training   Training Prescription  Yes  Yes      Weight  5lbs  5lbs      Reps  10-15  10-15      Time  10 Minutes  10 Minutes        Interval Training   Interval Training  No  No        Recumbant Bike   Level  2  2      Minutes  10  10      METs  5.2  4.9        NuStep   Level  5  5       SPM  110  110      Minutes  10  10      METs  5.2  4.9        Track   Laps  16  16      Minutes  10  10      METs  3.76  3.76        Home Exercise Plan   Plans to continue exercise at  Home (comment) Walking  Home (comment) Walking      Frequency  Add 2 additional days to program exercise sessions.  Add 2 additional days to program exercise sessions.      Initial Home Exercises Provided  08/02/18  08/02/18         Exercise Comments: Exercise Comments    Row Name 07/10/18 1402 08/02/18 0812 08/18/18 1524 09/18/18 1037     Exercise Comments  Pt's first day of exercise. Pt  oriented to exercise equipment. Pt off to good start with exercise. Will continue to monitor.   Reviewed HEP with pt. Pt is currently walking daily at home. Pt will continue to exercise on non rehab days. Will follow up with pt regarding home exercise plan.   Reviewed METs and Goals with pt. Pt is continuing to respond well to workloads.   Reviewed METs and Goals with pt. Pt is continuing to increase stamina and exercise workloads. Pt continues to put forth great effort while in rehab.        Exercise Goals and Review: Exercise Goals    Row Name 07/06/18 1050             Exercise Goals   Increase Physical Activity  Yes       Intervention  Provide advice, education, support and counseling about physical activity/exercise needs.;Develop an individualized exercise prescription for aerobic and resistive training based on initial evaluation findings, risk stratification, comorbidities and participant's personal goals.       Expected Outcomes  Short Term: Attend rehab on a regular basis to increase amount of physical activity.;Long Term: Add in home exercise to make exercise part of routine and to increase amount of physical activity.;Long Term: Exercising regularly at least 3-5 days a week.       Increase Strength and Stamina  Yes       Intervention  Provide advice, education, support and counseling about physical  activity/exercise needs.;Develop an individualized exercise prescription for aerobic and resistive training based on initial evaluation findings, risk stratification, comorbidities and participant's personal goals.       Expected Outcomes  Short Term: Increase workloads from initial exercise prescription for resistance, speed, and METs.;Short Term: Perform resistance training exercises routinely during rehab and add in resistance training at home;Long Term: Improve cardiorespiratory fitness, muscular endurance and strength as measured by increased METs and functional capacity (6MWT)       Able to understand and use rate of perceived exertion (RPE) scale  Yes       Intervention  Provide education and explanation on how to use RPE scale       Expected Outcomes  Short Term: Able to use RPE daily in rehab to express subjective intensity level;Long Term:  Able to use RPE to guide intensity level when exercising independently       Knowledge and understanding of Target Heart Rate Range (THRR)  Yes       Intervention  Provide education and explanation of THRR including how the numbers were predicted and where they are located for reference       Expected Outcomes  Short Term: Able to state/look up THRR;Short Term: Able to use daily as guideline for intensity in rehab;Long Term: Able to use THRR to govern intensity when exercising independently       Able to check pulse independently  Yes       Intervention  Provide education and demonstration on how to check pulse in carotid and radial arteries.;Review the importance of being able to check your own pulse for safety during independent exercise       Expected Outcomes  Short Term: Able to explain why pulse checking is important during independent exercise;Long Term: Able to check pulse independently and accurately       Understanding of Exercise Prescription  Yes       Intervention  Provide education, explanation, and written materials on patient's individual  exercise prescription       Expected Outcomes  Short Term: Able to explain program exercise prescription;Long Term: Able to explain home exercise prescription to exercise independently          Exercise Goals Re-Evaluation : Exercise Goals Re-Evaluation    Row Name 08/02/18 0813 08/18/18 1525 09/18/18 1038         Exercise Goal Re-Evaluation   Exercise Goals Review  Able to understand and use rate of perceived exertion (RPE) scale;Increase Physical Activity;Knowledge and understanding of Target Heart Rate Range (THRR);Understanding of Exercise Prescription;Increase Strength and Stamina;Able to check pulse independently  Increase Physical Activity;Understanding of Exercise Prescription  Increase Physical Activity;Understanding of Exercise Prescription     Comments  Reviewed HEP with pt. Also reviewed THRR, RPE Scale, weather precautions, endpoints of exericse, NTG use, warmup and cool down.   Pt is responding well to workloads. Pt [place] mall for 20-30 minutes for exercise. Pt used 5lbs weights today for the first time and responded well.   Pt is making great progress while in rehab. Pt had a recent visit with cardiologist. Pt states she recieved a great report from doctor and is motivated to continue to exercise and live a heart healthy lifestyle. Will follow up with pt regarding increase Nustep level to 6 from 5.      Expected Outcomes  Pt will continue to walk 5-7 days a week, 30-45 minutes. Pt will continue to increase cardiorespiratory fitness. Will continue to monitor and progress pt.   Pt will continue to walk for exercise 5 days a week for 30-19mnutes. Will continue to monitor and progress pt.   Pt will continue to walk at home 4-5 days a week for 30-45 minutes. Pt has about 8 more sessions in rehab. Will continue to work with pt to increase cardiovascular strength.         Discharge Exercise Prescription (Final Exercise Prescription Changes): Exercise Prescription  Changes - 09/22/18 0848      Response to Exercise   Blood Pressure (Admit)  118/80    Blood Pressure (Exercise)  132/68    Blood Pressure (Exit)  102/78    Heart Rate (Admit)  84 bpm    Heart Rate (Exercise)  120 bpm    Heart Rate (Exit)  80 bpm    Rating of Perceived Exertion (Exercise)  12    Perceived Dyspnea (Exercise)  0    Symptoms  None    Comments  None    Duration  Progress to 45 minutes of aerobic exercise without signs/symptoms of physical distress    Intensity  THRR unchanged      Progression   Progression  Continue to progress workloads to maintain intensity without signs/symptoms of physical distress.    Average METs  4.53      Resistance Training   Training Prescription  Yes    Weight  5lbs    Reps  10-15    Time  10 Minutes      Interval Training   Interval Training  No      Recumbant Bike   Level  2    Minutes  10    METs  4.9      NuStep   Level  5    SPM  110    Minutes  10    METs  4.9      Track   Laps  16    Minutes  10    METs  3.76      Home Exercise Plan  Plans to continue exercise at  Home (comment)   Walking   Frequency  Add 2 additional days to program exercise sessions.    Initial Home Exercises Provided  08/02/18       Nutrition:  Target Goals: Understanding of nutrition guidelines, daily intake of sodium <1512m, cholesterol <2054m calories 30% from fat and 7% or less from saturated fats, daily to have 5 or more servings of fruits and vegetables.  Biometrics: Pre Biometrics - 07/06/18 1051      Pre Biometrics   Height  5' 7"  (1.702 m)    Weight  86 kg    Waist Circumference  39 inches    Hip Circumference  42 inches    Waist to Hip Ratio  0.93 %    BMI (Calculated)  29.69    Triceps Skinfold  30 mm    % Body Fat  41.3 %    Grip Strength  36 kg    Flexibility  15.5 in    Single Leg Stand  12.47 seconds      Post Biometrics - 09/27/18 0812       Post  Biometrics   Height  5' 7"  (1.702 m)    Weight  84.9 kg     Waist Circumference  39 inches    Hip Circumference  41 inches    Waist to Hip Ratio  0.95 %    BMI (Calculated)  29.31    Triceps Skinfold  34 mm    % Body Fat  41.4 %    Grip Strength  31 kg   Pre was done on left hand, Post was done on right hand due to arthritis in left hand   Flexibility  12.5 in    Single Leg Stand  8.78 seconds       Nutrition Therapy Plan and Nutrition Goals: Nutrition Therapy & Goals - 07/06/18 1022      Nutrition Therapy   Diet  carbohydrate consistent heart healthy      Personal Nutrition Goals   Nutrition Goal  Pt to identify and limit food sources of saturated fat, trans fat, sodium, and refined carbohydrates    Personal Goal #2  Pt to identify food quantities necessary to achieve weight loss of 6-24 lbs. at graduation from cardiac rehab. Goal wt of 170 lb desired.    Personal Goal #3  Pt able to name foods that affect blood glucose.      Intervention Plan   Intervention  Prescribe, educate and counsel regarding individualized specific dietary modifications aiming towards targeted core components such as weight, hypertension, lipid management, diabetes, heart failure and other comorbidities.    Expected Outcomes  Short Term Goal: Understand basic principles of dietary content, such as calories, fat, sodium, cholesterol and nutrients.       Nutrition Assessments: Nutrition Assessments - 07/26/18 0805      MEDFICTS Scores   Pre Score  12       Nutrition Goals Re-Evaluation: Nutrition Goals Re-Evaluation    [place] 07/06/18 1022             Goals   Current Weight  189 lb 9.5 oz (86 kg)          Nutrition Goals Re-Evaluation: Nutrition Goals Re-Evaluation    [place] 07/06/18 1022             Goals   Current Weight  189 lb 9.5 oz (86 kg)  Nutrition Goals Discharge (Final Nutrition Goals Re-Evaluation): Nutrition Goals Re-Evaluation - 07/06/18 1022      Goals   Current Weight  189 lb 9.5 oz (86 kg)        Psychosocial: Target Goals: Acknowledge presence or absence of significant depression and/or stress, maximize coping skills, provide positive support system. Participant is able to verbalize types and ability to use techniques and skills needed for reducing stress and depression.  Initial Review & Psychosocial Screening: Initial Psych Review & Screening - 07/06/18 1002      Initial Review   Current issues with  None Identified      Family Dynamics   Good Support System?  Yes   Pt reports that her family is very supportive of her.     Barriers   Psychosocial barriers to participate in program  There are no identifiable barriers or psychosocial needs.      Screening Interventions   Interventions  Encouraged to exercise       Quality of Life Scores: Quality of Life - 07/06/18 1008      Quality of Life   Select  Quality of Life      Quality of Life Scores   Health/Function Pre  26.39 %    Socioeconomic Pre  24 %    Psych/Spiritual Pre  25.71 %    Family Pre  30 %    GLOBAL Pre  26.17 %      Scores of 19 and below usually indicate a poorer quality of life in these areas.  A difference of  2-3 points is a clinically meaningful difference.  A difference of 2-3 points in the total score of the Quality of Life Index has been associated with significant improvement in overall quality of life, self-image, physical symptoms, and general health in studies assessing change in quality of life.  PHQ-9: Recent Review Flowsheet Data    Depression screen [facility] 2/9 07/11/2018 05/08/2018 07/12/2017 01/01/2015 08/10/2013   Decreased Interest 0 0 0 0 0   Down, Depressed, Hopeless 0 0 0 0 0   PHQ - 2 Score 0 0 0 0 0   Altered sleeping - 0 - - -   Tired, decreased energy - 0 - - -   Change in appetite - 0 - - -   Feeling bad or failure about yourself  - 0 - - -   Trouble concentrating - 0 - - -   Moving slowly or fidgety/restless - 0 - - -   Suicidal thoughts - 0 - - -   PHQ-9 Score - 0 - - -    Difficult doing work/chores - Not difficult at all - - -     Interpretation of Total Score  Total Score Depression Severity:  1-4 = Minimal depression, 5-9 = Mild depression, 10-14 = Moderate depression, 15-19 = Moderately severe depression, 20-27 = Severe depression   Psychosocial Evaluation and Intervention: Psychosocial Evaluation - 07/28/18 1415      Psychosocial Evaluation & Interventions   Interventions  Encouraged to exercise with the program and follow exercise prescription    Comments  No psychosocial needs identified.     Expected Outcomes  [name] will maintain a positive outlook.     Continue Psychosocial Services   No Follow up required       Psychosocial Re-Evaluation: Psychosocial Re-Evaluation    [place] Name 07/28/18 1415 08/28/18 1640 09/21/18 1656         Psychosocial Re-Evaluation   Current issues  with  None Identified  None Identified  None Identified     Comments  No interventions necessary.   No interventions necessary.   No interventions necessary.      Expected Outcomes  [name] will continue to have a positive outlookwith good coping skills.    [name] will continue to have a positive outlookwith good coping skills.    [name] will continue to have a positive outlookwith good coping skills.       Interventions  Encouraged to attend Cardiac Rehabilitation for the exercise  Encouraged to attend Cardiac Rehabilitation for the exercise  Encouraged to attend Cardiac Rehabilitation for the exercise     Continue Psychosocial Services   No Follow up required  No Follow up required  No Follow up required        Psychosocial Discharge (Final Psychosocial Re-Evaluation): Psychosocial Re-Evaluation - 09/21/18 1656      Psychosocial Re-Evaluation   Current issues with  None Identified    Comments  No interventions necessary.     Expected Outcomes  [name] will continue to have a positive outlookwith good coping skills.      Interventions  Encouraged to attend Cardiac  Rehabilitation for the exercise    Continue Psychosocial Services   No Follow up required       Vocational Rehabilitation: Provide vocational rehab assistance to qualifying candidates.   Vocational Rehab Evaluation & Intervention: Vocational Rehab - 07/06/18 1002      Initial Vocational Rehab Evaluation & Intervention   Assessment shows need for Vocational Rehabilitation  No       Education: Education Goals: Education classes will be provided on a weekly basis, covering required topics. Participant will state understanding/return demonstration of topics presented.  Learning Barriers/Preferences: Learning Barriers/Preferences - 07/06/18 1102      Learning Barriers/Preferences   Learning Barriers  Sight    Learning Preferences  Verbal Instruction;Written Material;Individual Instruction       Education Topics: Count Your Pulse:  -Group instruction provided by verbal instruction, demonstration, patient participation and written materials to support subject.  Instructors address importance of being able to find your pulse and how to count your pulse when at home without a heart monitor.  Patients get hands on experience counting their pulse with staff help and individually.   Heart Attack, Angina, and Risk Factor Modification:  -Group instruction provided by verbal instruction, video, and written materials to support subject.  Instructors address signs and symptoms of angina and heart attacks.    Also discuss risk factors for heart disease and how to make changes to improve heart health risk factors.   Functional Fitness:  -Group instruction provided by verbal instruction, demonstration, patient participation, and written materials to support subject.  Instructors address safety measures for doing things around the house.  Discuss how to get up and down off the floor, how to pick things up properly, how to safely get out of a chair without assistance, and balance  training.   Meditation and Mindfulness:  -Group instruction provided by verbal instruction, patient participation, and written materials to support subject.  Instructor addresses importance of mindfulness and meditation practice to help reduce stress and improve awareness.  Instructor also leads participants through a meditation exercise.    Stretching for Flexibility and Mobility:  -Group instruction provided by verbal instruction, patient participation, and written materials to support subject.  Instructors lead participants through series of stretches that are designed to increase flexibility thus improving mobility.  These stretches are additional exercise  for major muscle groups that are typically performed during regular warm up and cool down.   Hands Only CPR:  -Group verbal, video, and participation provides a basic overview of AHA guidelines for community CPR. Role-play of emergencies allow participants the opportunity to practice calling for help and chest compression technique with discussion of AED use.   Hypertension: -Group verbal and written instruction that provides a basic overview of hypertension including the most recent diagnostic guidelines, risk factor reduction with self-care instructions and medication management.    Nutrition I class: Heart Healthy Eating:  -Group instruction provided by PowerPoint slides, verbal discussion, and written materials to support subject matter. The instructor gives an explanation and review of the Therapeutic Lifestyle Changes diet recommendations, which includes a discussion on lipid goals, dietary fat, sodium, fiber, plant stanol/sterol esters, sugar, and the components of a well-balanced, healthy diet.   Nutrition II class: Lifestyle Skills:  -Group instruction provided by PowerPoint slides, verbal discussion, and written materials to support subject matter. The instructor gives an explanation and review of label reading, grocery  shopping for heart health, heart healthy recipe modifications, and ways to make healthier choices when eating out.   Diabetes Question & Answer:  -Group instruction provided by PowerPoint slides, verbal discussion, and written materials to support subject matter. The instructor gives an explanation and review of diabetes co-morbidities, pre- and post-prandial blood glucose goals, pre-exercise blood glucose goals, signs, symptoms, and treatment of hypoglycemia and hyperglycemia, and foot care basics.   Diabetes Blitz:  -Group instruction provided by PowerPoint slides, verbal discussion, and written materials to support subject matter. The instructor gives an explanation and review of the physiology behind type 1 and type 2 diabetes, diabetes medications and rational behind using different medications, pre- and post-prandial blood glucose recommendations and Hemoglobin A1c goals, diabetes diet, and exercise including blood glucose guidelines for exercising safely.    Portion Distortion:  -Group instruction provided by PowerPoint slides, verbal discussion, written materials, and food models to support subject matter. The instructor gives an explanation of serving size versus portion size, changes in portions sizes over the last 20 years, and what consists of a serving from each food group.   Stress Management:  -Group instruction provided by verbal instruction, video, and written materials to support subject matter.  Instructors review role of stress in heart disease and how to cope with stress positively.     Exercising on Your Own:  -Group instruction provided by verbal instruction, power point, and written materials to support subject.  Instructors discuss benefits of exercise, components of exercise, frequency and intensity of exercise, and end points for exercise.  Also discuss use of nitroglycerin and activating EMS.  Review options of places to exercise outside of rehab.  Review guidelines  for sex with heart disease.   Cardiac Drugs I:  -Group instruction provided by verbal instruction and written materials to support subject.  Instructor reviews cardiac drug classes: antiplatelets, anticoagulants, beta blockers, and statins.  Instructor discusses reasons, side effects, and lifestyle considerations for each drug class.   Cardiac Drugs II:  -Group instruction provided by verbal instruction and written materials to support subject.  Instructor reviews cardiac drug classes: angiotensin converting enzyme inhibitors (ACE-I), angiotensin II receptor blockers (ARBs), nitrates, and calcium channel blockers.  Instructor discusses reasons, side effects, and lifestyle considerations for each drug class.   Anatomy and Physiology of the Circulatory System:  Group verbal and written instruction and models provide basic cardiac anatomy and physiology, with the coronary  electrical and arterial systems. Review of: AMI, Angina, Valve disease, Heart Failure, Peripheral Artery Disease, Cardiac Arrhythmia, Pacemakers, and the ICD.   Other Education:  -Group or individual verbal, written, or video instructions that support the educational goals of the cardiac rehab program.   Holiday Eating Survival Tips:  -Group instruction provided by PowerPoint slides, verbal discussion, and written materials to support subject matter. The instructor gives patients tips, tricks, and techniques to help them not only survive but enjoy the holidays despite the onslaught of food that accompanies the holidays.   Knowledge Questionnaire Score: Knowledge Questionnaire Score - 07/06/18 1001      Knowledge Questionnaire Score   Pre Score  18/24       Core Components/Risk Factors/Patient Goals at Admission: Personal Goals and Risk Factors at Admission - 07/06/18 1102      Core Components/Risk Factors/Patient Goals on Admission    Weight Management  Yes    Intervention  Weight Management: Develop a combined  nutrition and exercise program designed to reach desired caloric intake, while maintaining appropriate intake of nutrient and fiber, sodium and fats, and appropriate energy expenditure required for the weight goal.;Weight Management: Provide education and appropriate resources to help participant work on and attain dietary goals.;Weight Management/Obesity: Establish reasonable short term and long term weight goals.    Admit Weight  189 lb 9.5 oz (86 kg)    Goal Weight: Short Term  180 lb (81.6 kg)    Goal Weight: Long Term  170 lb (77.1 kg)    Expected Outcomes  Short Term: Continue to assess and modify interventions until short term weight is achieved;Long Term: Adherence to nutrition and physical activity/exercise program aimed toward attainment of established weight goal;Weight Loss: Understanding of general recommendations for a balanced deficit meal plan, which promotes 1-2 lb weight loss per week and includes a negative energy balance of [phone] kcal/d;Understanding recommendations for meals to include 15-35% energy as protein, 25-35% energy from fat, 35-60% energy from carbohydrates, less than 230m of dietary cholesterol, 20-35 gm of total fiber daily;Understanding of distribution of calorie intake throughout the day with the consumption of 4-5 meals/snacks    Hypertension  Yes    Intervention  Provide education on lifestyle modifcations including regular physical activity/exercise, weight management, moderate sodium restriction and increased consumption of fresh fruit, vegetables, and low fat dairy, alcohol moderation, and smoking cessation.;Monitor prescription use compliance.    Expected Outcomes  Short Term: Continued assessment and intervention until BP is < 140/968mHG in hypertensive participants. < 130/8060mG in hypertensive participants with diabetes, heart failure or chronic kidney disease.;Long Term: Maintenance of blood pressure at goal levels.    Lipids  Yes    Intervention  Provide  education and support for participant on nutrition & aerobic/resistive exercise along with prescribed medications to achieve LDL <6m46mDL >40mg72m Expected Outcomes  Short Term: Participant states understanding of desired cholesterol values and is compliant with medications prescribed. Participant is following exercise prescription and nutrition guidelines.;Long Term: Cholesterol controlled with medications as prescribed, with individualized exercise RX and with personalized nutrition plan. Value goals: LDL < 6mg,54m > 40 mg.    Stress  Yes    Intervention  Offer individual and/or small group education and counseling on adjustment to heart disease, stress management and health-related lifestyle change. Teach and support self-help strategies.;Refer participants experiencing significant psychosocial distress to appropriate mental health specialists for further evaluation and treatment. When possible, include family members and significant others in education/counseling  sessions.    Expected Outcomes  Short Term: Participant demonstrates changes in health-related behavior, relaxation and other stress management skills, ability to obtain effective social support, and compliance with psychotropic medications if prescribed.;Long Term: Emotional wellbeing is indicated by absence of clinically significant psychosocial distress or social isolation.       Core Components/Risk Factors/Patient Goals Review:  Goals and Risk Factor Review    Row Name 07/11/18 1524 07/28/18 1416 08/28/18 1640 09/21/18 1656       Core Components/Risk Factors/Patient Goals Review   Personal Goals Review  Weight Management/Obesity;Hypertension;Lipids;Stress  Weight Management/Obesity;Hypertension;Lipids;Stress  Weight Management/Obesity;Hypertension;Lipids;Stress  Weight Management/Obesity;Hypertension;Lipids;Stress    Review  pt with multiple CAD RF demonstrates willingness to participate in CR program.   pt personal goal is to  lose weight and inches, while getting in better shape physically.  Pt with multiple CAD RF willing to participate in CR program.  [name] has enjoyed coming to the program thus far and feels she has increased her strength.   Pt with multiple CAD RF willing to participate in CR program.  [name] continues to enjoy CR.  She feels that she is increasing her strength still.   Pt with multiple CAD RF willing to participate in CR program.  [name] continues to enjoy CR and the socialization.  She feels that she is increasing her strength still.     Expected Outcomes  pt will participate in CR exercise, nutrition and lifestyle modification to decrease overall RF and reach weight loss/functional ability personal goals.   Pt will participate in CR exercise, nutrition, and lifestyle modification opportunities to decrease overall RF.   Pt will participate in CR exercise, nutrition, and lifestyle modification opportunities to decrease overall RF.   Pt will participate in CR exercise, nutrition, and lifestyle modification opportunities to decrease overall RF.        Core Components/Risk Factors/Patient Goals at Discharge (Final Review):  Goals and Risk Factor Review - 09/21/18 1656      Core Components/Risk Factors/Patient Goals Review   Personal Goals Review  Weight Management/Obesity;Hypertension;Lipids;Stress    Review  Pt with multiple CAD RF willing to participate in CR program.  [name] continues to enjoy CR and the socialization.  She feels that she is increasing her strength still.     Expected Outcomes  Pt will participate in CR exercise, nutrition, and lifestyle modification opportunities to decrease overall RF.        ITP Comments: ITP Comments    Row Name 07/06/18 1114 07/11/18 1522 07/28/18 1414 08/28/18 1639 09/21/18 1655   ITP Comments  Dr. Fransico Him, Medical Director   pt started group exercise 07/10/2018.  pt tolerated light activity without difficulty. pt oriented to exercise equipment and safety  routine.   30 Day ITP Review.  [name] has been enjoying cardiac rehab thus far.  She has a great attendance and feels that she is gaining strength.   30 Day ITP Review.  [name] continues to enjoy CR and the socialization.  She has a great attendance and continues to gain strength.   30 Day ITP Review.  [name] continues to enjoy CR and the socialization.  She continues to gain strength.       Comments: See ITP Comments.

## 2018-09-29 ENCOUNTER — Encounter (HOSPITAL_COMMUNITY)
Admission: RE | Admit: 2018-09-29 | Discharge: 2018-09-29 | Disposition: A | Discharge status: Home / Self Care | Payer: Medicare Other | Source: Ambulatory Visit | Attending: Cardiovascular Disease | Admitting: Cardiovascular Disease

## 2018-09-29 ENCOUNTER — Encounter (HOSPITAL_COMMUNITY): Payer: Medicare Other

## 2018-09-29 DIAGNOSIS — Z79899 Other long term (current) drug therapy: Secondary | ICD-10-CM | POA: Diagnosis not present

## 2018-09-29 DIAGNOSIS — E785 Hyperlipidemia, unspecified: Secondary | ICD-10-CM | POA: Insufficient documentation

## 2018-09-29 DIAGNOSIS — Z79890 Hormone replacement therapy (postmenopausal): Secondary | ICD-10-CM | POA: Insufficient documentation

## 2018-09-29 DIAGNOSIS — I10 Essential (primary) hypertension: Secondary | ICD-10-CM | POA: Diagnosis not present

## 2018-09-29 DIAGNOSIS — I213 ST elevation (STEMI) myocardial infarction of unspecified site: Secondary | ICD-10-CM | POA: Diagnosis not present

## 2018-10-02 ENCOUNTER — Encounter (HOSPITAL_COMMUNITY): Payer: Medicare Other

## 2018-10-02 ENCOUNTER — Encounter (HOSPITAL_COMMUNITY)
Admission: RE | Admit: 2018-10-02 | Discharge: 2018-10-02 | Disposition: A | Discharge status: Home / Self Care | Payer: Medicare Other | Source: Ambulatory Visit | Attending: Cardiovascular Disease | Admitting: Cardiovascular Disease

## 2018-10-02 DIAGNOSIS — I213 ST elevation (STEMI) myocardial infarction of unspecified site: Secondary | ICD-10-CM | POA: Diagnosis not present

## 2018-10-02 DIAGNOSIS — I10 Essential (primary) hypertension: Secondary | ICD-10-CM | POA: Diagnosis not present

## 2018-10-02 DIAGNOSIS — Z79899 Other long term (current) drug therapy: Secondary | ICD-10-CM | POA: Diagnosis not present

## 2018-10-02 DIAGNOSIS — E785 Hyperlipidemia, unspecified: Secondary | ICD-10-CM | POA: Diagnosis not present

## 2018-10-04 ENCOUNTER — Encounter (HOSPITAL_COMMUNITY)
Admission: RE | Admit: 2018-10-04 | Discharge: 2018-10-04 | Disposition: A | Discharge status: Home / Self Care | Payer: Medicare Other | Source: Ambulatory Visit | Attending: Cardiovascular Disease | Admitting: Cardiovascular Disease

## 2018-10-04 ENCOUNTER — Encounter (HOSPITAL_COMMUNITY): Payer: Medicare Other

## 2018-10-04 DIAGNOSIS — I213 ST elevation (STEMI) myocardial infarction of unspecified site: Secondary | ICD-10-CM

## 2018-10-04 DIAGNOSIS — Z79899 Other long term (current) drug therapy: Secondary | ICD-10-CM | POA: Diagnosis not present

## 2018-10-04 DIAGNOSIS — E785 Hyperlipidemia, unspecified: Secondary | ICD-10-CM | POA: Diagnosis not present

## 2018-10-04 DIAGNOSIS — I10 Essential (primary) hypertension: Secondary | ICD-10-CM | POA: Diagnosis not present

## 2018-10-06 ENCOUNTER — Encounter (HOSPITAL_COMMUNITY)
Admission: RE | Admit: 2018-10-06 | Discharge: 2018-10-06 | Disposition: A | Discharge status: Home / Self Care | Payer: Medicare Other | Source: Ambulatory Visit | Attending: Cardiovascular Disease | Admitting: Cardiovascular Disease

## 2018-10-06 ENCOUNTER — Encounter (HOSPITAL_COMMUNITY): Payer: Medicare Other

## 2018-10-06 DIAGNOSIS — I213 ST elevation (STEMI) myocardial infarction of unspecified site: Secondary | ICD-10-CM | POA: Diagnosis not present

## 2018-10-06 DIAGNOSIS — Z79899 Other long term (current) drug therapy: Secondary | ICD-10-CM | POA: Diagnosis not present

## 2018-10-06 DIAGNOSIS — E785 Hyperlipidemia, unspecified: Secondary | ICD-10-CM | POA: Diagnosis not present

## 2018-10-06 DIAGNOSIS — I10 Essential (primary) hypertension: Secondary | ICD-10-CM | POA: Diagnosis not present

## 2018-10-09 ENCOUNTER — Encounter (HOSPITAL_COMMUNITY)
Admission: RE | Admit: 2018-10-09 | Discharge: 2018-10-09 | Disposition: A | Discharge status: Home / Self Care | Payer: Medicare Other | Source: Ambulatory Visit | Attending: Cardiovascular Disease | Admitting: Cardiovascular Disease

## 2018-10-09 ENCOUNTER — Encounter (HOSPITAL_COMMUNITY): Payer: Medicare Other

## 2018-10-09 DIAGNOSIS — I10 Essential (primary) hypertension: Secondary | ICD-10-CM | POA: Diagnosis not present

## 2018-10-09 DIAGNOSIS — Z79899 Other long term (current) drug therapy: Secondary | ICD-10-CM | POA: Diagnosis not present

## 2018-10-09 DIAGNOSIS — I213 ST elevation (STEMI) myocardial infarction of unspecified site: Secondary | ICD-10-CM

## 2018-10-09 DIAGNOSIS — E785 Hyperlipidemia, unspecified: Secondary | ICD-10-CM | POA: Diagnosis not present

## 2018-10-11 ENCOUNTER — Encounter (HOSPITAL_COMMUNITY): Payer: Medicare Other

## 2018-10-19 NOTE — Progress Notes (Signed)
Discharge Progress Report  Patient Details  Name: [name] MRN: [id number] Date of Birth: 02-Oct-1951 Referring Provider:     [facility] from 07/06/2018 in [place]  Referring Provider  [name] MD        Number of Visits: 36  Reason for Discharge:  Patient reached a stable level of exercise. Patient independent in their exercise. Patient has met program and personal goals.  Smoking History:  Social History   Tobacco Use  Smoking Status Never Smoker  Smokeless Tobacco Never Used    Diagnosis:  ST elevation myocardial infarction (STEMI), unspecified artery ([place])  ADL [facility]:   Initial Exercise Prescription: Initial Exercise Prescription - 07/06/18 1000      Date of Initial Exercise RX and Referring Provider   Date  07/06/18    Referring Provider  [name] MD     Expected Discharge Date  09/29/18      Recumbant Bike   Level  2    Watts  40    Minutes  10    METs  3.48      NuStep   Level  3    SPM  75    Minutes  10    METs  2.6      Track   Laps  13    Minutes  10    METs  3.3      Prescription Details   Frequency (times per week)  3x    Duration  Progress to 30 minutes of continuous aerobic without signs/symptoms of physical distress      Intensity   THRR 40-80% of Max Heartrate  61-122    Ratings of Perceived Exertion  11-13    Perceived Dyspnea  0-4      Progression   Progression  Continue progressive overload as per policy without signs/symptoms or physical distress.      Resistance Training   Training Prescription  Yes    Weight  3lbs    Reps  10-15       Discharge Exercise Prescription (Final Exercise Prescription Changes): Exercise Prescription Changes - 10/09/18 1138      Response to Exercise   Blood Pressure (Admit)  130/78    Blood Pressure (Exercise)  138/76    Blood Pressure (Exit)  118/68    Heart Rate (Admit)  85 bpm    Heart Rate (Exercise)  117 bpm     Heart Rate (Exit)  86 bpm    Rating of Perceived Exertion (Exercise)  10    Perceived Dyspnea (Exercise)  0    Symptoms  Pt graduated from Cardiac Rehab     Comments  None    Duration  Progress to 45 minutes of aerobic exercise without signs/symptoms of physical distress    Intensity  THRR unchanged      Progression   Progression  Continue to progress workloads to maintain intensity without signs/symptoms of physical distress.    Average METs  4.6      Resistance Training   Training Prescription  No      Interval Training   Interval Training  No      Recumbant Bike   Level  3.5    Minutes  10    METs  6.2      NuStep   Level  5    SPM  110    Minutes  10    METs  3.6  Track   Laps  17    Minutes  10    METs  3.99      Home Exercise Plan   Plans to continue exercise at  Home (comment)   Walking   Frequency  Add 3 additional days to program exercise sessions.    Initial Home Exercises Provided  08/02/18       Functional Capacity: 6 Minute Walk    Row Name 07/06/18 1047 09/27/18 0810       6 Minute Walk   Phase  Initial  Discharge    Distance  1706 feet  1908 feet    Distance % Change  -  11.84 %    Distance Feet Change  -  202 ft    Walk Time  6 minutes  6 minutes    # of Rest Breaks  0  0    MPH  3.23  3.61    METS  3.63  4.49    RPE  11  11    Perceived Dyspnea   0  0    VO2 Peak  12.69  15.72    Symptoms  No  No    Resting HR  58 bpm  90 bpm    Resting BP  114/68  124/70    Resting Oxygen Saturation   98 %  -    Exercise Oxygen Saturation  during 6 min walk  96 %  -    Max Ex. HR  96 bpm  137 bpm    Max Ex. BP  134/70  142/84    2 Minute Post BP  130/76  118/70       Psychological, QOL, Others - Outcomes: PHQ 2/9: Depression screen [facility] 2/9 10/19/2018 07/11/2018 05/08/2018 07/12/2017 01/01/2015  Decreased Interest 0 0 0 0 0  Down, Depressed, Hopeless 0 0 0 0 0  PHQ - 2 Score 0 0 0 0 0  Altered sleeping - - 0 - -  Tired, decreased energy -  - 0 - -  Change in appetite - - 0 - -  Feeling bad or failure about yourself  - - 0 - -  Trouble concentrating - - 0 - -  Moving slowly or fidgety/restless - - 0 - -  Suicidal thoughts - - 0 - -  PHQ-9 Score - - 0 - -  Difficult doing work/chores - - Not difficult at all - -  Some recent data might be hidden    Quality of Life: Quality of Life - 10/12/18 1144      Quality of Life   Select  --   Pt did not return post QOL       Personal Goals: Goals established at orientation with interventions provided to work toward goal. Personal Goals and Risk Factors at Admission - 07/06/18 1102      Core Components/Risk Factors/Patient Goals on Admission    Weight Management  Yes    Intervention  Weight Management: Develop a combined nutrition and exercise program designed to reach desired caloric intake, while maintaining appropriate intake of nutrient and fiber, sodium and fats, and appropriate energy expenditure required for the weight goal.;Weight Management: Provide education and appropriate resources to help participant work on and attain dietary goals.;Weight Management/Obesity: Establish reasonable short term and long term weight goals.    Admit Weight  189 lb 9.5 oz (86 kg)    Goal Weight: Short Term  180 lb (81.6 kg)    Goal Weight: Long Term  170 lb (77.1 kg)    Expected Outcomes  Short Term: Continue to assess and modify interventions until short term weight is achieved;Long Term: Adherence to nutrition and physical activity/exercise program aimed toward attainment of established weight goal;Weight Loss: Understanding of general recommendations for a balanced deficit meal plan, which promotes 1-2 lb weight loss per week and includes a negative energy balance of [phone] kcal/d;Understanding recommendations for meals to include 15-35% energy as protein, 25-35% energy from fat, 35-60% energy from carbohydrates, less than 267m of dietary cholesterol, 20-35 gm of total fiber  daily;Understanding of distribution of calorie intake throughout the day with the consumption of 4-5 meals/snacks    Hypertension  Yes    Intervention  Provide education on lifestyle modifcations including regular physical activity/exercise, weight management, moderate sodium restriction and increased consumption of fresh fruit, vegetables, and low fat dairy, alcohol moderation, and smoking cessation.;Monitor prescription use compliance.    Expected Outcomes  Short Term: Continued assessment and intervention until BP is < 140/951mHG in hypertensive participants. < 130/8022mG in hypertensive participants with diabetes, heart failure or chronic kidney disease.;Long Term: Maintenance of blood pressure at goal levels.    Lipids  Yes    Intervention  Provide education and support for participant on nutrition & aerobic/resistive exercise along with prescribed medications to achieve LDL <12m3mDL >40mg68m Expected Outcomes  Short Term: Participant states understanding of desired cholesterol values and is compliant with medications prescribed. Participant is following exercise prescription and nutrition guidelines.;Long Term: Cholesterol controlled with medications as prescribed, with individualized exercise RX and with personalized nutrition plan. Value goals: LDL < 12mg,26m > 40 mg.    Stress  Yes    Intervention  Offer individual and/or small group education and counseling on adjustment to heart disease, stress management and health-related lifestyle change. Teach and support self-help strategies.;Refer participants experiencing significant psychosocial distress to appropriate mental health specialists for further evaluation and treatment. When possible, include family members and significant others in education/counseling sessions.    Expected Outcomes  Short Term: Participant demonstrates changes in health-related behavior, relaxation and other stress management skills, ability to obtain effective social  support, and compliance with psychotropic medications if prescribed.;Long Term: Emotional wellbeing is indicated by absence of clinically significant psychosocial distress or social isolation.        Personal Goals Discharge: Goals and Risk Factor Review    Row Name 07/11/18 1524 07/28/18 1416 08/28/18 1640 09/21/18 1656 10/12/18 1644     Core Components/Risk Factors/Patient Goals Review   Personal Goals Review  Weight Management/Obesity;Hypertension;Lipids;Stress  Weight Management/Obesity;Hypertension;Lipids;Stress  Weight Management/Obesity;Hypertension;Lipids;Stress  Weight Management/Obesity;Hypertension;Lipids;Stress  -   Review  pt with multiple CAD RF demonstrates willingness to participate in CR program.   pt personal goal is to lose weight and inches, while getting in better shape physically.  Pt with multiple CAD RF willing to participate in CR program.  Lashun hJeydanjoyed coming to the program thus far and feels she has increased her strength.   Pt with multiple CAD RF willing to participate in CR program.  Reiley cHallienues to enjoy CR.  She feels that she is increasing her strength still.   Pt with multiple CAD RF willing to participate in CR program.  Tonianne cIyonnanues to enjoy CR and the socialization.  She feels that she is increasing her strength still.   Tamorah hChandlarraduated from CR [id number] complete sessions.  She feels that she increased her confidence.  She enjoyed participating in CR.  Expected Outcomes  pt will participate in CR exercise, nutrition and lifestyle modification to decrease overall RF and reach weight loss/functional ability personal goals.   Pt will participate in CR exercise, nutrition, and lifestyle modification opportunities to decrease overall RF.   Pt will participate in CR exercise, nutrition, and lifestyle modification opportunities to decrease overall RF.   Pt will participate in CR exercise, nutrition, and lifestyle modification opportunities to decrease overall RF.    Pt will participate exercise, nutrition, and lifestyle modification opportunities to decrease overall RF.  Jerae plans to walk for exercise.       Exercise Goals and Review: Exercise Goals    Row Name 07/06/18 1050             Exercise Goals   Increase Physical Activity  Yes       Intervention  Provide advice, education, support and counseling about physical activity/exercise needs.;Develop an individualized exercise prescription for aerobic and resistive training based on initial evaluation findings, risk stratification, comorbidities and participant's personal goals.       Expected Outcomes  Short Term: Attend rehab on a regular basis to increase amount of physical activity.;Long Term: Add in home exercise to make exercise part of routine and to increase amount of physical activity.;Long Term: Exercising regularly at least 3-5 days a week.       Increase Strength and Stamina  Yes       Intervention  Provide advice, education, support and counseling about physical activity/exercise needs.;Develop an individualized exercise prescription for aerobic and resistive training based on initial evaluation findings, risk stratification, comorbidities and participant's personal goals.       Expected Outcomes  Short Term: Increase workloads from initial exercise prescription for resistance, speed, and METs.;Short Term: Perform resistance training exercises routinely during rehab and add in resistance training at home;Long Term: Improve cardiorespiratory fitness, muscular endurance and strength as measured by increased METs and functional capacity (6MWT)       Able to understand and use rate of perceived exertion (RPE) scale  Yes       Intervention  Provide education and explanation on how to use RPE scale       Expected Outcomes  Short Term: Able to use RPE daily in rehab to express subjective intensity level;Long Term:  Able to use RPE to guide intensity level when exercising independently        Knowledge and understanding of Target Heart Rate Range (THRR)  Yes       Intervention  Provide education and explanation of THRR including how the numbers were predicted and where they are located for reference       Expected Outcomes  Short Term: Able to state/look up THRR;Short Term: Able to use daily as guideline for intensity in rehab;Long Term: Able to use THRR to govern intensity when exercising independently       Able to check pulse independently  Yes       Intervention  Provide education and demonstration on how to check pulse in carotid and radial arteries.;Review the importance of being able to check your own pulse for safety during independent exercise       Expected Outcomes  Short Term: Able to explain why pulse checking is important during independent exercise;Long Term: Able to check pulse independently and accurately       Understanding of Exercise Prescription  Yes       Intervention  Provide education, explanation, and written materials on patient's individual exercise prescription  Expected Outcomes  Short Term: Able to explain program exercise prescription;Long Term: Able to explain home exercise prescription to exercise independently          Exercise Goals Re-Evaluation: Exercise Goals Re-Evaluation    Row Name 08/02/18 0813 08/18/18 1525 09/18/18 1038 10/12/18 1146       Exercise Goal Re-Evaluation   Exercise Goals Review  Able to understand and use rate of perceived exertion (RPE) scale;Increase Physical Activity;Knowledge and understanding of Target Heart Rate Range (THRR);Understanding of Exercise Prescription;Increase Strength and Stamina;Able to check pulse independently  Increase Physical Activity;Understanding of Exercise Prescription  Increase Physical Activity;Understanding of Exercise Prescription  Increase Physical Activity;Understanding of Exercise Prescription    Comments  Reviewed HEP with pt. Also reviewed THRR, RPE Scale, weather precautions, endpoints  of exericse, NTG use, warmup and cool down.   Pt is responding well to workloads. Pt [place] mall for 20-30 minutes for exercise. Pt used 5lbs weights today for the first time and responded well.   Pt is making great progress while in rehab. Pt had a recent visit with cardiologist. Pt states she recieved a great report from doctor and is motivated to continue to exercise and live a heart healthy lifestyle. Will follow up with pt regarding increase Nustep level to 6 from 5.   Pt completed 36 sessions of Cardiac Rehab. Pt increased her functional capacity by 11.84%. Pt increased post 6MWT distance by 266f. Pt decreased weight by 3lbs. Pt's goal was to increase stamina and edurance. Pt reached goal and states she is feeling better.    Expected Outcomes  Pt will continue to walk 5-7 days a week, 30-45 minutes. Pt will continue to increase cardiorespiratory fitness. Will continue to monitor and progress pt.   Pt will continue to walk for exercise 5 days a week for 30-481mutes. Will continue to monitor and progress pt.   Pt will continue to walk at home 4-5 days a week for 30-45 minutes. Pt has about 8 more sessions in rehab. Will continue to work with pt to increase cardiovascular strength.   Pt will continue to walk 3-4 days at home for 30-45 minutes. Pt will continue to live a heart healthy lifestyle.        Nutrition & Weight - Outcomes: Pre Biometrics - 07/06/18 1051      Pre Biometrics   Height  _0  (1.702 m)    Weight  86 kg    Waist Circumference  39 inches    Hip Circumference  42 inches    Waist to Hip Ratio  0.93 %    BMI (Calculated)  29.69    Triceps Skinfold  30 mm    % Body Fat  41.3 %    Grip Strength  36 kg    Flexibility  15.5 in    Single Leg Stand  12.47 seconds      Post Biometrics - 09/27/18 0812       Post  Biometrics   Height  _1  (1.702 m)    Weight  84.9 kg    Waist Circumference  39 inches    Hip Circumference  41 inches    Waist to Hip  Ratio  0.95 %    BMI (Calculated)  29.31    Triceps Skinfold  34 mm    % Body Fat  41.4 %    Grip Strength  31 kg   Pre was done on left hand, Post was done on right  hand due to arthritis in left hand   Flexibility  12.5 in    Single Leg Stand  8.78 seconds       Nutrition: Nutrition Therapy & Goals - 07/06/18 1022      Nutrition Therapy   Diet  carbohydrate consistent heart healthy      Personal Nutrition Goals   Nutrition Goal  Pt to identify and limit food sources of saturated fat, trans fat, sodium, and refined carbohydrates    Personal Goal #2  Pt to identify food quantities necessary to achieve weight loss of 6-24 lbs. at graduation from cardiac rehab. Goal wt of 170 lb desired.    Personal Goal #3  Pt able to name foods that affect blood glucose.      Intervention Plan   Intervention  Prescribe, educate and counsel regarding individualized specific dietary modifications aiming towards targeted core components such as weight, hypertension, lipid management, diabetes, heart failure and other comorbidities.    Expected Outcomes  Short Term Goal: Understand basic principles of dietary content, such as calories, fat, sodium, cholesterol and nutrients.       Nutrition Discharge: Nutrition Assessments - 10/13/18 1411      MEDFICTS Scores   Pre Score  12    Post Score  --   pt did not return survey      Education Questionnaire Score: Knowledge Questionnaire Score - 10/12/18 1144      Knowledge Questionnaire Score   Post Score  --   Pt did not return Post Quiz      Goals reviewed with patient; copy given to patient.

## 2018-11-05 NOTE — Progress Notes (Signed)
[name] [place] , [place] [address] Phone: [phone] Subjective:   [name], am serving as a scribe for Dr. Hulan Saas.   CC: Left thumb pain  WLN:[id number]  [name] is a 67 y.o. female coming in with complaint of left thumb pain. She has had improvement. Soreness at night.  Nothing to severe though.  States overall 80% better  Also complains of soreness in left knee. Does feel that she improved from last visit.  Patient does have known arthritic changes.  Patient denies any numbness or tingling or any instability.  Happy with the results       Past Medical History:  Diagnosis Date  . Anemia   . Arthritis   . Cholelithiasis   . Chronic back pain   . Constipation   . Esophageal reflux   . Fibromyalgia   . Hyperlipidemia   . Hypertension   . Hypothyroid   . IBS (irritable bowel syndrome)   . Internal hemorrhoids without mention of complication   . Left leg pain   . Personal history of colonic polyps 05/06/2010   ADENOMATOUS POLYP   Past Surgical History:  Procedure Laterality Date  . ABDOMINAL HYSTERECTOMY    . CERVICAL DISC SURGERY     PLATE IN NECK & BACK  . CHOLECYSTECTOMY    . COLONOSCOPY  09/27/2011   NORMAL   . LEFT HEART CATH AND CORONARY ANGIOGRAPHY N/A 05/08/2018   Procedure: LEFT HEART CATH AND CORONARY ANGIOGRAPHY;  Surgeon: Belva Crome, MD;  Location: [facility];  Service: Cardiovascular;  Laterality: N/A;  . LUMBAR [place]    . TONSILLECTOMY     Social History   Socioeconomic History  . Marital status: Widowed    Spouse name: Not on file  . Number of children: 2  . Years of education: Not on file  . Highest education level: Not on file  Occupational History  . Occupation: housewife    Employer: UNEMPLOYED   Social Needs  . Financial resource strain: Not hard at all  . Food insecurity:    Worry: Never true    Inability: Never true  . Transportation needs:   Medical: [name]    Non-medical: [name]  Tobacco Use  . Smoking status: Never Smoker  . Smokeless tobacco: Never Used  Substance and Sexual Activity  . Alcohol use: [name]    Alcohol/week: 0.0 standard drinks  . Drug use: [name]  . Sexual activity: Not Currently  Lifestyle  . Physical activity:    Days per week: 3 days    Minutes per session: 40 min  . Stress: Not at all  Relationships  . Social connections:    Talks on phone: More than three times a week    Gets together: More than three times a week    Attends religious service: More than 4 times per year    Active member of club or organization: Yes    Attends meetings of clubs or organizations: More than 4 times per year    Relationship status: Married  Other Topics Concern  . Not on file  Social History Narrative   Daily Caffeine Use   Allergies  Allergen Reactions  . Livalo [Pitavastatin] Other (See Comments)    myalgias  . Aspirin Other (See Comments)    unknown  . Gadolinium Hives    patient unsure of which kind of dye she had a reaction to until reaction to Magnevist -  may be allergic to x-ray contrast, Onset Date: [id number]   . Hydrocodone-Acetaminophen   . Iohexol Hives    patient unsure of which kind of dye she had a reaction to until reaction to Magnevist - may be allergic to x-ray contrast, Onset Date: [id number]  . Lipitor [Atorvastatin] Other (See Comments)    Myalgias   Family History  Problem Relation Age of Onset  . Diabetes Mother        aunts and uncles  . Kidney disease Mother        stage 3  . Lung disease Mother        colapsed lung/in hospice  . COPD Mother   . Hypertension Mother   . Dementia Mother   . Breast cancer Maternal Aunt   . Heart failure Father        Mother  . Breast cancer Cousin   . Colon cancer Neg Hx     Current Outpatient Medications (Endocrine & Metabolic):  .  levothyroxine (SYNTHROID, LEVOTHROID) 125 MCG tablet, TAKE 1 TABLET (125 MCG TOTAL) BY MOUTH DAILY BEFORE  BREAKFAST.  Current Outpatient Medications (Cardiovascular):  .  isosorbide mononitrate (IMDUR) 30 MG 24 hr tablet, Take 1 tablet (30 mg total) by mouth daily. Marland Kitchen  losartan (COZAAR) 25 MG tablet, Take 1 tablet (25 mg total) by mouth daily. .  metoprolol succinate (TOPROL-XL) 25 MG 24 hr tablet, Take 1 tablet (25 mg total) by mouth daily. .  nitroGLYCERIN (NITROSTAT) 0.4 MG SL tablet, Place 1 tablet (0.4 mg total) under the tongue every 5 (five) minutes as needed for chest pain. .  rosuvastatin (CRESTOR) 10 MG tablet, Take 1 tablet (10 mg total) by mouth daily.  Current Outpatient Medications (Respiratory):  .  benzonatate (TESSALON) 100 MG capsule, Take 1 capsule (100 mg total) by mouth 3 (three) times daily as needed for cough. .  fluticasone (FLONASE) 50 MCG/ACT nasal spray, Place 1 spray into both nostrils daily. Marland Kitchen  loratadine (CLARITIN) 10 MG tablet, Take 1 tablet (10 mg total) by mouth daily.   Current Outpatient Medications (Hematological):  .  clopidogrel (PLAVIX) 75 MG tablet, Take 1 tablet (75 mg total) by mouth daily with breakfast.  Current Outpatient Medications (Other):  Marland Kitchen  Vitamin D, Ergocalciferol, (DRISDOL) 50000 units CAPS capsule, TAKE 1 CAPSULE (50,000 UNITS TOTAL) BY MOUTH EVERY 7 (SEVEN) DAYS.    Past medical history, social, surgical and family history all reviewed in electronic medical record.  [name] pertanent information unless stated regarding to the chief complaint.   Review of Systems:  [name] headache, visual changes, nausea, vomiting, diarrhea, constipation, dizziness, abdominal pain, skin rash, fevers, chills, night sweats, weight loss, swollen lymph nodes, body aches, joint swelling,  chest pain, shortness of breath, mood changes.  Mild positive muscle aches  Objective  Blood pressure 132/82, pulse 69, height 5' 7"  (1.702 m), weight 189 lb (85.7 kg), SpO2 99 %.    General: [name] apparent distress alert and oriented x3 mood and affect normal, dressed appropriately.   HEENT: Pupils equal, extraocular movements intact  Respiratory: Patient's speak in full sentences and does not appear short of breath  Cardiovascular: [name] lower extremity edema, non tender, [name] erythema  Skin: Warm dry intact with [name] signs of infection or rash on extremities or on axial skeleton.  Abdomen: Soft nontender  Neuro: Cranial nerves II through XII are intact, neurovascularly intact in all extremities with 2+ DTRs and 2+ pulses.  Lymph: [name] lymphadenopathy of posterior or anterior cervical chain or  axillae bilaterally.  Gait normal with good balance and coordination.  MSK:  Non tender with full range of motion and good stability and symmetric strength and tone of shoulders, elbows, wrist, hip, and ankles bilaterally.  Mild pain over the left [facility] joint with grind test Knee: Left valgus deformity noted. Large thigh to calf ratio.  Tender to palpation over medial and PF joint line.  ROM full in flexion and extension and lower leg rotation. instability with valgus force.  painful patellar compression. Patellar glide with moderate crepitus. Patellar and quadriceps tendons unremarkable. Hamstring and quadriceps strength is normal. Contralateral knee shows mild arthritic changes as well   Impression and Recommendations:     The above documentation has been reviewed and is accurate and complete Lyndal Pulley, DO       Note: This dictation was prepared with Dragon dictation along with smaller phrase technology. Any transcriptional errors that result from this process are unintentional.

## 2018-11-06 ENCOUNTER — Encounter: Payer: Self-pay | Admitting: Family Medicine

## 2018-11-06 ENCOUNTER — Ambulatory Visit (INDEPENDENT_AMBULATORY_CARE_PROVIDER_SITE_OTHER): Payer: Medicare Other | Admitting: Family Medicine

## 2018-11-06 VITALS — BP 132/82 | HR 69 | Ht 67.0 in | Wt 189.0 lb

## 2018-11-06 DIAGNOSIS — Z23 Encounter for immunization: Secondary | ICD-10-CM

## 2018-11-06 DIAGNOSIS — M1712 Unilateral primary osteoarthritis, left knee: Secondary | ICD-10-CM | POA: Diagnosis not present

## 2018-11-06 DIAGNOSIS — M1812 Unilateral primary osteoarthritis of first carpometacarpal joint, left hand: Secondary | ICD-10-CM | POA: Diagnosis not present

## 2018-11-06 DIAGNOSIS — R7303 Prediabetes: Secondary | ICD-10-CM

## 2018-11-06 LAB — GLUCOSE, POCT (MANUAL RESULT ENTRY): POC GLUCOSE: 116 mg/dL — AB (ref 70–99)

## 2018-11-06 NOTE — Assessment & Plan Note (Signed)
Stable.  Discussed icing regimen and home exercise.  Discussed which activities to do which wants to avoid.  Increase activity as tolerated.  Follow-up again in 4 to 8 weeks

## 2018-11-06 NOTE — Assessment & Plan Note (Signed)
Patient does have arthritic changes of the knee.  Has done well after the injection though.  Continue to monitor at this time.  No significant changes.  Follow-up again in 2 months

## 2018-11-06 NOTE — Patient Instructions (Signed)
Good to see you  Ice is your friend Stay active Flu shot given today  Keep trucking along  See me again  2 months

## 2019-01-06 NOTE — Progress Notes (Signed)
[name] [place], [place] [address] Phone: [phone] Subjective:    I'm seeing this patient by the request  of:    CC: Knee pain and thumb pain follow-up  GNO:[id number]     Update 01/08/2019: [name] is a 68 y.o. female coming in with complaint of left knee pain. She states that her pain in her knee has subsided.  Known arthritic changes.  States that she is feeling 90% better.  She is here today for left [facility] joint pain. Feels a soreness in thenar eminence. Also experiences numbness intermittently but mostly at night. Denies pain with grasping or with pronation and supination.   Patient is concerned that it will get much worse.  Still not as bad As before the injection in October.  Been the left side.    Past Medical History:  Diagnosis Date  . Anemia   . Arthritis   . Cholelithiasis   . Chronic back pain   . Constipation   . Esophageal reflux   . Fibromyalgia   . Hyperlipidemia   . Hypertension   . Hypothyroid   . IBS (irritable bowel syndrome)   . Internal hemorrhoids without mention of complication   . Left leg pain   . Personal history of colonic polyps 05/06/2010   ADENOMATOUS POLYP   Past Surgical History:  Procedure Laterality Date  . ABDOMINAL HYSTERECTOMY    . CERVICAL DISC SURGERY     PLATE IN NECK & BACK  . CHOLECYSTECTOMY    . COLONOSCOPY  09/27/2011   NORMAL   . LEFT HEART CATH AND CORONARY ANGIOGRAPHY N/A 05/08/2018   Procedure: LEFT HEART CATH AND CORONARY ANGIOGRAPHY;  Surgeon: Belva Crome, MD;  Location: [facility];  Service: Cardiovascular;  Laterality: N/A;  . LUMBAR [place]    . TONSILLECTOMY     Social History   Socioeconomic History  . Marital status: Widowed    Spouse name: Not on file  . Number of children: 2  . Years of education: Not on file  . Highest education level: Not on file  Occupational History  . Occupation: housewife    Employer: UNEMPLOYED   Social  Needs  . Financial resource strain: Not hard at all  . Food insecurity:    Worry: Never true    Inability: Never true  . Transportation needs:    Medical: No    Non-medical: No  Tobacco Use  . Smoking status: Never Smoker  . Smokeless tobacco: Never Used  Substance and Sexual Activity  . Alcohol use: No    Alcohol/week: 0.0 standard drinks  . Drug use: No  . Sexual activity: Not Currently  Lifestyle  . Physical activity:    Days per week: 3 days    Minutes per session: 40 min  . Stress: Not at all  Relationships  . Social connections:    Talks on phone: More than three times a week    Gets together: More than three times a week    Attends religious service: More than 4 times per year    Active member of club or organization: Yes    Attends meetings of clubs or organizations: More than 4 times per year    Relationship status: Married  Other Topics Concern  . Not on file  Social History Narrative   Daily Caffeine Use   Allergies  Allergen Reactions  . Livalo [Pitavastatin] Other (See Comments)    myalgias  .  Aspirin Other (See Comments)    unknown  . Gadolinium Hives    patient unsure of which kind of dye she had a reaction to until reaction to Magnevist - may be allergic to x-ray contrast, Onset Date: [id number]   . Hydrocodone-Acetaminophen   . Iohexol Hives    patient unsure of which kind of dye she had a reaction to until reaction to Magnevist - may be allergic to x-ray contrast, Onset Date: [id number]  . Lipitor [Atorvastatin] Other (See Comments)    Myalgias   Family History  Problem Relation Age of Onset  . Diabetes Mother        aunts and uncles  . Kidney disease Mother        stage 3  . Lung disease Mother        colapsed lung/in hospice  . COPD Mother   . Hypertension Mother   . Dementia Mother   . Breast cancer Maternal Aunt   . Heart failure Father        Mother  . Breast cancer Cousin   . Colon cancer Neg Hx     Current Outpatient Medications  (Endocrine & Metabolic):  .  levothyroxine (SYNTHROID, LEVOTHROID) 125 MCG tablet, TAKE 1 TABLET (125 MCG TOTAL) BY MOUTH DAILY BEFORE BREAKFAST.  Current Outpatient Medications (Cardiovascular):  .  isosorbide mononitrate (IMDUR) 30 MG 24 hr tablet, Take 1 tablet (30 mg total) by mouth daily. Marland Kitchen  losartan (COZAAR) 25 MG tablet, Take 1 tablet (25 mg total) by mouth daily. .  metoprolol succinate (TOPROL-XL) 25 MG 24 hr tablet, Take 1 tablet (25 mg total) by mouth daily. .  nitroGLYCERIN (NITROSTAT) 0.4 MG SL tablet, Place 1 tablet (0.4 mg total) under the tongue every 5 (five) minutes as needed for chest pain. .  rosuvastatin (CRESTOR) 10 MG tablet, Take 1 tablet (10 mg total) by mouth daily.  Current Outpatient Medications (Respiratory):  .  benzonatate (TESSALON) 100 MG capsule, Take 1 capsule (100 mg total) by mouth 3 (three) times daily as needed for cough. .  fluticasone (FLONASE) 50 MCG/ACT nasal spray, Place 1 spray into both nostrils daily. Marland Kitchen  loratadine (CLARITIN) 10 MG tablet, Take 1 tablet (10 mg total) by mouth daily.   Current Outpatient Medications (Hematological):  .  clopidogrel (PLAVIX) 75 MG tablet, Take 1 tablet (75 mg total) by mouth daily with breakfast.  Current Outpatient Medications (Other):  Marland Kitchen  Vitamin D, Ergocalciferol, (DRISDOL) 50000 units CAPS capsule, TAKE 1 CAPSULE (50,000 UNITS TOTAL) BY MOUTH EVERY 7 (SEVEN) DAYS.    Past medical history, social, surgical and family history all reviewed in electronic medical record.  No pertanent information unless stated regarding to the chief complaint.   Review of Systems:  No headache, visual changes, nausea, vomiting, diarrhea, constipation, dizziness, abdominal pain, skin rash, fevers, chills, night sweats, weight loss, swollen lymph nodes, body aches, joint swelling, muscle aches, chest pain, shortness of breath, mood changes.   Objective  Blood pressure 110/82, pulse 72, height 5' 7"  (1.702 m), SpO2 96 %.     General: No apparent distress alert and oriented x3 mood and affect normal, dressed appropriately.  HEENT: Pupils equal, extraocular movements intact  Respiratory: Patient's speak in full sentences and does not appear short of breath  Cardiovascular: No lower extremity edema, non tender, no erythema  Skin: Warm dry intact with no signs of infection or rash on extremities or on axial skeleton.  Abdomen: Soft nontender  Neuro: Cranial nerves II through XII  are intact, neurovascularly intact in all extremities with 2+ DTRs and 2+ pulses.  Lymph: No lymphadenopathy of posterior or anterior cervical chain or axillae bilaterally.  Gait normal with good balance and coordination.  MSK:  tender with full range of motion and good stability and symmetric strength and tone of shoulders, elbows, wrist, hip, and ankles bilaterally.  Knee: Bilateral valgus deformity noted.  Abnormal thigh to calf ratio.  Tender to palpation over medial and PF joint line.  ROM full in flexion and extension and lower leg rotation. Mild instability with valgus force.  painful patellar compression. Patellar glide with mild crepitus. Patellar and quadriceps tendons unremarkable. Hamstring and quadriceps strength is normal.   Left hand exam does show some thenar eminence wasting noted.  Positive CMC grind.  Mild positive Finkelstein's.  Negative Tinel's.  Full range of motion of the wrist noted  Limited musculoskeletal ultrasound was performed and interpreted by Lyndal Pulley  Limited ultrasound of patient's left [facility] joint shows some mild to moderate osteoarthritic changes but no joint effusion noted today.  Mild tenosynovitis noted of the abductor pollicis longus tendon sheath.  Otherwise fairly unremarkable.  Median nerve diameter within normal limits Impression: CMC arthritis   Impression and Recommendations:     This case required medical decision making of moderate complexity. The above documentation has been  reviewed and is accurate and complete Lyndal Pulley, DO       Note: This dictation was prepared with Dragon dictation along with smaller phrase technology. Any transcriptional errors that result from this process are unintentional.

## 2019-01-08 ENCOUNTER — Ambulatory Visit: Payer: Self-pay

## 2019-01-08 ENCOUNTER — Ambulatory Visit (INDEPENDENT_AMBULATORY_CARE_PROVIDER_SITE_OTHER): Payer: Medicare Other | Admitting: Family Medicine

## 2019-01-08 VITALS — BP 110/82 | HR 72 | Ht 67.0 in

## 2019-01-08 DIAGNOSIS — M1712 Unilateral primary osteoarthritis, left knee: Secondary | ICD-10-CM

## 2019-01-08 DIAGNOSIS — M79642 Pain in left hand: Secondary | ICD-10-CM | POA: Diagnosis not present

## 2019-01-08 DIAGNOSIS — M1812 Unilateral primary osteoarthritis of first carpometacarpal joint, left hand: Secondary | ICD-10-CM

## 2019-01-08 NOTE — Assessment & Plan Note (Signed)
Doing relatively well at this moment.  No need in changing any medical management at this time

## 2019-01-08 NOTE — Assessment & Plan Note (Signed)
Ultrasound done today.  Patient does have arthritic changes but no significant inflammation.  Encourage patient to try more conservative therapy with the home exercises and bracing before we get into the more aggressive therapy such as injections again.  Patient is in agreement with the plan and will follow-up again in 2 months

## 2019-01-08 NOTE — Patient Instructions (Addendum)
Good to see you  pennsaid pinkie amount topically 2 times daily as needed.  Wear the brace day and night for 1 week then nightly for at least 2 weeks Vitamin D 2000 IU daily  CoQ10 213m daily  Tart cherry extract any dose at night Go to [employer] and see if they have deals  See me again in 2 months

## 2019-01-12 ENCOUNTER — Encounter: Payer: Self-pay | Admitting: Internal Medicine

## 2019-01-12 ENCOUNTER — Ambulatory Visit (INDEPENDENT_AMBULATORY_CARE_PROVIDER_SITE_OTHER): Payer: Medicare Other | Admitting: Internal Medicine

## 2019-01-12 ENCOUNTER — Other Ambulatory Visit (INDEPENDENT_AMBULATORY_CARE_PROVIDER_SITE_OTHER): Payer: Medicare Other

## 2019-01-12 VITALS — BP 126/70 | HR 61 | Temp 98.6°F | Ht 67.0 in | Wt 186.8 lb

## 2019-01-12 DIAGNOSIS — K5904 Chronic idiopathic constipation: Secondary | ICD-10-CM | POA: Diagnosis not present

## 2019-01-12 DIAGNOSIS — R7303 Prediabetes: Secondary | ICD-10-CM

## 2019-01-12 DIAGNOSIS — E039 Hypothyroidism, unspecified: Secondary | ICD-10-CM

## 2019-01-12 DIAGNOSIS — R945 Abnormal results of liver function studies: Secondary | ICD-10-CM

## 2019-01-12 DIAGNOSIS — Z23 Encounter for immunization: Secondary | ICD-10-CM

## 2019-01-12 DIAGNOSIS — I10 Essential (primary) hypertension: Secondary | ICD-10-CM | POA: Diagnosis not present

## 2019-01-12 DIAGNOSIS — R7989 Other specified abnormal findings of blood chemistry: Secondary | ICD-10-CM

## 2019-01-12 LAB — COMPREHENSIVE METABOLIC PANEL
ALBUMIN: 4 g/dL (ref 3.5–5.2)
ALT: 66 U/L — ABNORMAL HIGH (ref 0–35)
AST: 86 U/L — ABNORMAL HIGH (ref 0–37)
Alkaline Phosphatase: 95 U/L (ref 39–117)
BUN: 12 mg/dL (ref 6–23)
CO2: 28 mEq/L (ref 19–32)
Calcium: 9.3 mg/dL (ref 8.4–10.5)
Chloride: 103 mEq/L (ref 96–112)
Creatinine, Ser: 0.79 mg/dL (ref 0.40–1.20)
GFR: 87.67 mL/min (ref >60.00)
Glucose, Bld: 174 mg/dL — ABNORMAL HIGH (ref 70–99)
Potassium: 3.7 mEq/L (ref 3.5–5.1)
Sodium: 139 mEq/L (ref 135–145)
Total Bilirubin: 1.9 mg/dL — ABNORMAL HIGH (ref 0.2–1.2)
Total Protein: 7.1 g/dL (ref 6.0–8.3)

## 2019-01-12 LAB — TSH: TSH: <0.01 u[IU]/mL — ABNORMAL LOW (ref 0.35–4.50)

## 2019-01-12 LAB — HEMOGLOBIN A1C: Hgb A1c MFr Bld: 6 % (ref 4.6–6.5)

## 2019-01-12 LAB — MAGNESIUM: Magnesium: 1.7 mg/dL (ref 1.5–2.5)

## 2019-01-12 MED ORDER — MAGNESIUM OXIDE 400 MG PO CAPS: 1.0000 | ORAL_CAPSULE | Freq: Every day | ORAL | 1 refills | Status: DC | PRN

## 2019-01-12 MED ORDER — LUBIPROSTONE 24 MCG PO CAPS: 24.0000 ug | ORAL_CAPSULE | Freq: Two times a day (BID) | ORAL | 1 refills | Status: DC

## 2019-01-12 NOTE — Progress Notes (Signed)
Subjective:  Patient ID: [name], female    DOB: [birth year]  Age: 68 y.o. MRN: [id number]  CC: Hypothyroidism   HPI ADDIS BENNIE presents for f/up - She complains of constipation.  She has about 1-2 bowel movements a week and intermittently feels bloated.  She is not getting adequate symptom relief with over-the-counter remedies.  Outpatient Medications Prior to Visit  Medication Sig Dispense Refill  . clopidogrel (PLAVIX) 75 MG tablet Take 1 tablet (75 mg total) by mouth daily with breakfast. 90 tablet 3  . fluticasone (FLONASE) 50 MCG/ACT nasal spray Place 1 spray into both nostrils daily. 16 g 2  . isosorbide mononitrate (IMDUR) 30 MG 24 hr tablet Take 1 tablet (30 mg total) by mouth daily. 90 tablet 3  . loratadine (CLARITIN) 10 MG tablet Take 1 tablet (10 mg total) by mouth daily. 30 tablet 3  . losartan (COZAAR) 25 MG tablet Take 1 tablet (25 mg total) by mouth daily. 90 tablet 3  . metoprolol succinate (TOPROL-XL) 25 MG 24 hr tablet Take 1 tablet (25 mg total) by mouth daily. 90 tablet 3  . nitroGLYCERIN (NITROSTAT) 0.4 MG SL tablet Place 1 tablet (0.4 mg total) under the tongue every 5 (five) minutes as needed for chest pain. 25 tablet 1  . Vitamin D, Ergocalciferol, (DRISDOL) 50000 units CAPS capsule TAKE 1 CAPSULE (50,000 UNITS TOTAL) BY MOUTH EVERY 7 (SEVEN) DAYS. 12 capsule 0  . benzonatate (TESSALON) 100 MG capsule Take 1 capsule (100 mg total) by mouth 3 (three) times daily as needed for cough. 20 capsule 0  . levothyroxine (SYNTHROID, LEVOTHROID) 125 MCG tablet TAKE 1 TABLET (125 MCG TOTAL) BY MOUTH DAILY BEFORE BREAKFAST. 90 tablet 0  . rosuvastatin (CRESTOR) 10 MG tablet Take 1 tablet (10 mg total) by mouth daily. 90 tablet 3   No facility-administered medications prior to visit.     ROS Review of Systems  Constitutional: Negative for diaphoresis, fatigue and unexpected weight change.  HENT: Negative.   Respiratory: Negative for cough, chest tightness, shortness  of breath and wheezing.   Cardiovascular: Negative for chest pain, palpitations and leg swelling.  Gastrointestinal: Positive for constipation. Negative for abdominal pain, blood in stool, diarrhea, nausea and vomiting.  Endocrine: Negative for cold intolerance and heat intolerance.  Genitourinary: Negative.  Negative for difficulty urinating, dysuria and hematuria.  Musculoskeletal: Negative for arthralgias, back pain, myalgias and neck pain.  Skin: Negative.  Negative for color change, pallor and rash.  Neurological: Negative.  Negative for dizziness, weakness, light-headedness and headaches.  Hematological: Negative for adenopathy. Does not bruise/bleed easily.  Psychiatric/Behavioral: Negative.     Objective:  BP 126/70 (BP Location: Left Arm, Patient Position: Sitting, Cuff Size: Normal)   Pulse 61   Temp 98.6 F (37 C) (Oral)   Ht 5' 7"  (1.702 m)   Wt 186 lb 12 oz (84.7 kg)   SpO2 94%   BMI 29.25 kg/m   BP Readings from Last 3 Encounters:  01/12/19 126/70  01/08/19 110/82  11/06/18 132/82    Wt Readings from Last 3 Encounters:  01/12/19 186 lb 12 oz (84.7 kg)  11/06/18 189 lb (85.7 kg)  09/27/18 187 lb 2.7 oz (84.9 kg)    Physical Exam Vitals signs reviewed.  Constitutional:      Appearance: She is not ill-appearing or diaphoretic.  HENT:     Nose: Nose normal. No congestion or rhinorrhea.     Mouth/Throat:     Mouth: Mucous membranes are  moist.     Pharynx: Oropharynx is clear. No oropharyngeal exudate or posterior oropharyngeal erythema.  Eyes:     General: No scleral icterus.    Conjunctiva/sclera: Conjunctivae normal.  Neck:     Musculoskeletal: Normal range of motion and neck supple. No muscular tenderness.  Cardiovascular:     Rate and Rhythm: Normal rate and regular rhythm.     Pulses: Normal pulses.     Heart sounds: No murmur. No gallop.   Pulmonary:     Effort: Pulmonary effort is normal. No respiratory distress.     Breath sounds: No stridor. No  wheezing, rhonchi or rales.  Abdominal:     General: Abdomen is flat. Bowel sounds are decreased.     Palpations: There is no hepatomegaly, splenomegaly or mass.     Tenderness: There is no abdominal tenderness. There is no guarding.  Musculoskeletal: Normal range of motion.        General: No swelling.     Right lower leg: No edema.     Left lower leg: No edema.  Skin:    General: Skin is warm and dry.     Coloration: Skin is not pale.     Findings: No erythema or rash.  Neurological:     General: No focal deficit present.     Mental Status: She is oriented to person, place, and time. Mental status is at baseline.     Lab Results  Component Value Date   WBC 8.4 05/16/2018   HGB 12.5 05/16/2018   HCT 36.8 05/16/2018   PLT 383 05/16/2018   GLUCOSE 174 (H) 01/12/2019   CHOL 109 06/27/2018   TRIG 114 06/27/2018   HDL 43 06/27/2018   LDLDIRECT 88.0 10/19/2016   LDLCALC 43 06/27/2018   ALT 66 (H) 01/12/2019   AST 86 (H) 01/12/2019   NA 139 01/12/2019   K 3.7 01/12/2019   CL 103 01/12/2019   CREATININE 0.79 01/12/2019   BUN 12 01/12/2019   CO2 28 01/12/2019   TSH <0.01 Repeated and verified X2. (L) 01/12/2019   INR 1.01 05/08/2018   HGBA1C 6.0 01/12/2019    No results found.  Assessment & Plan:   Monika was seen today for hypothyroidism.  Diagnoses and all orders for this visit:  Acquired hypothyroidism- Her TSH is suppressed so I have asked her to lower her dose of levothyroxine. -     TSH; Future -     levothyroxine (SYNTHROID, LEVOTHROID) 88 MCG tablet; Take 1 tablet (88 mcg total) by mouth daily.  Prediabetes- Her A1c is at 6.0%.  Medical therapy is not indicated.  She was encouraged to improve her lifestyle modifications. -     Comprehensive metabolic panel; Future -     Hemoglobin A1c; Future  Essential hypertension- Her blood pressure is adequately well controlled.  Electrolytes and renal function are normal. -     Comprehensive metabolic panel;  Future  Need for pneumococcal vaccination -     Pneumococcal polysaccharide vaccine 23-valent greater than or equal to 2yo subcutaneous/IM  Chronic idiopathic constipation- Her magnesium level is in the low normal range so have asked her to start taking a magnesium supplement.  This may help the constipation.  I have also asked her to start Amitiza.  Otherwise her labs are negative for secondary causes. -     Comprehensive metabolic panel; Future -     Magnesium; Future -     lubiprostone (AMITIZA) 24 MCG capsule; Take 1 capsule (24  mcg total) by mouth 2 (two) times daily with a meal. -     Magnesium Oxide 400 MG CAPS; Take 1 capsule (400 mg total) by mouth daily as needed.  Elevated LFTs- I have asked her to undergo a liver ultrasound to see if she has developed NASH. -     US Abdomen Limited RUQ; Future   I have discontinued Kerin Ransom. Horstman's benzonatate and levothyroxine. I am also having her start on lubiprostone, Magnesium Oxide, and levothyroxine. Additionally, I am having her maintain her isosorbide mononitrate, losartan, metoprolol succinate, nitroGLYCERIN, clopidogrel, fluticasone, loratadine, rosuvastatin, and Vitamin D (Ergocalciferol).  Meds ordered this encounter  Medications  . lubiprostone (AMITIZA) 24 MCG capsule    Sig: Take 1 capsule (24 mcg total) by mouth 2 (two) times daily with a meal.    Dispense:  180 capsule    Refill:  1  . Magnesium Oxide 400 MG CAPS    Sig: Take 1 capsule (400 mg total) by mouth daily as needed.    Dispense:  90 capsule    Refill:  1  . levothyroxine (SYNTHROID, LEVOTHROID) 88 MCG tablet    Sig: Take 1 tablet (88 mcg total) by mouth daily.    Dispense:  90 tablet    Refill:  0     Follow-up: Return in about 4 months (around 05/13/2019).  [name], MD

## 2019-01-12 NOTE — Patient Instructions (Signed)

## 2019-01-15 ENCOUNTER — Encounter: Payer: Self-pay | Admitting: Internal Medicine

## 2019-01-15 DIAGNOSIS — R7989 Other specified abnormal findings of blood chemistry: Secondary | ICD-10-CM | POA: Insufficient documentation

## 2019-01-15 DIAGNOSIS — R945 Abnormal results of liver function studies: Secondary | ICD-10-CM

## 2019-01-15 MED ORDER — LEVOTHYROXINE SODIUM 88 MCG PO TABS: 88.0000 ug | ORAL_TABLET | Freq: Every day | ORAL | 0 refills | Status: DC

## 2019-01-22 ENCOUNTER — Ambulatory Visit
Admission: RE | Admit: 2019-01-22 | Discharge: 2019-01-22 | Disposition: A | Discharge status: Home / Self Care | Payer: Medicare Other | Source: Ambulatory Visit | Attending: Internal Medicine | Admitting: Internal Medicine

## 2019-01-22 ENCOUNTER — Other Ambulatory Visit: Payer: Self-pay | Admitting: Internal Medicine

## 2019-01-22 DIAGNOSIS — R7989 Other specified abnormal findings of blood chemistry: Secondary | ICD-10-CM

## 2019-01-22 DIAGNOSIS — K7581 Nonalcoholic steatohepatitis (NASH): Secondary | ICD-10-CM

## 2019-01-22 DIAGNOSIS — R945 Abnormal results of liver function studies: Principal | ICD-10-CM

## 2019-01-22 DIAGNOSIS — K7689 Other specified diseases of liver: Secondary | ICD-10-CM | POA: Diagnosis not present

## 2019-01-22 MED ORDER — PIOGLITAZONE HCL 15 MG PO TABS: 15.0000 mg | ORAL_TABLET | Freq: Every day | ORAL | 1 refills | Status: DC

## 2019-01-30 DIAGNOSIS — H40003 Preglaucoma, unspecified, bilateral: Secondary | ICD-10-CM | POA: Diagnosis not present

## 2019-02-16 ENCOUNTER — Encounter: Payer: Self-pay | Admitting: Physician Assistant

## 2019-02-25 ENCOUNTER — Telehealth: Payer: Self-pay | Admitting: Physician Assistant

## 2019-02-25 NOTE — Telephone Encounter (Signed)
   Cardiac Questionnaire:    Since your last visit or hospitalization:    1. Have you been having new or worsening chest pain? no   2. Have you been having new or worsening shortness of breath? no 3. Have you been having new or worsening leg swelling, wt gain, or increase in abdominal girth (pants fitting more tightly)? no   4. Have you had any passing out spells? no    Patient does not have smartphone. She is agreeable to telephone visit.  PLAN:  1. Please call on 02/26/2019 to provide instructions and obtain consent.  Richardson Dopp, PA-C    02/25/2019 2:02 PM

## 2019-02-26 ENCOUNTER — Telehealth: Payer: Self-pay | Admitting: *Deleted

## 2019-02-26 DIAGNOSIS — I5181 Takotsubo syndrome: Secondary | ICD-10-CM | POA: Insufficient documentation

## 2019-02-26 NOTE — Telephone Encounter (Signed)
      TELEPHONE CALL NOTE  LIANE TRIBBEY has been deemed a candidate for a follow-up tele-health visit to limit community exposure during the Covid-19 pandemic. I spoke with the patient via phone to ensure availability of phone/video source, confirm preferred email & phone number, and discuss instructions and expectations.  I reminded REBBECCA OSUNA to be prepared with any vital sign and/or heart rhythm information that could potentially be obtained via home monitoring, at the time of her visit. I reminded LETICA GIAIMO to expect a phone call at the time of her visit if her visit.  Did the patient verbally acknowledge consent to treatment? Darlyn Chamber, CMA 02/26/2019 4:09 PM

## 2019-02-26 NOTE — Progress Notes (Signed)
Virtual Visit via Telephone Note    Evaluation Performed:  Follow-up visit  This visit type was conducted due to national recommendations for restrictions regarding the COVID-19 Pandemic (e.g. social distancing).  This format is felt to be most appropriate for this patient at this time.  All issues noted in this document were discussed and addressed.  No physical exam was performed (except for noted visual exam findings with Video Visits).  Please refer to the patient's chart (MyChart message for video visits and phone note for telephone visits) for the patient's consent to telehealth for [facility].  Date:  02/27/2019   ID:  [name], DOB 1951/10/30, MRN [id number]  Patient Location:  Home   Provider location:   Home   PCP:  [name], MD  Cardiologist:  [name], MD   Electrophysiologist:  None   Chief Complaint:  Follow up on Cardiomyopathy, CAD  History of Present Illness:    [name] is a 68 y.o. female who presents via audio/video conferencing for a telehealth visit today.    She has coronary artery disease and Tako-Tsubo CM.  She presented in 6/19 with ACS.  Cardiac Catheterization demonstrated 70% stenosis in the D1 that was too small for PCI.  She was noted to have EF 45-50 with anterior and apical HK suspicious or stress CM.  She was last seen by Dr. [name] in 08/2018.    Today, she is doing well.  She has not had any chest discomfort, significant shortness of breath, orthopnea, syncope or dizziness.  She has mild ankle edema at times that she attributes to increased sodium in her diet.  She has not had any bleeding issues.   The patient does not symptoms concerning for COVID-19 infection (fever, chills, cough, or new shortness of breath).    Prior CV studies:   The following studies were reviewed today:  Echo 09/12/18 Mild conc LVH, EF 55-60, no RWMA, Gr 1 DD, trivial TR, PASP 20  Echo 05/10/18 EF 45-50, mild LVH, apical ant and apical HK,  Gr 1 DD  Cardiac Catheterization 05/08/18 D2 70, 100 EF 45-50      Past Medical History:  Diagnosis Date  . Anemia   . Arthritis   . Cholelithiasis   . Chronic back pain   . Constipation   . Esophageal reflux   . Fibromyalgia   . Hyperlipidemia   . Hypertension   . Hypothyroid   . IBS (irritable bowel syndrome)   . Internal hemorrhoids without mention of complication   . Left leg pain   . Personal history of colonic polyps 05/06/2010   ADENOMATOUS POLYP   Past Surgical History:  Procedure Laterality Date  . ABDOMINAL HYSTERECTOMY    . CERVICAL DISC SURGERY     PLATE IN NECK & BACK  . CHOLECYSTECTOMY    . COLONOSCOPY  09/27/2011   NORMAL   . LEFT HEART CATH AND CORONARY ANGIOGRAPHY N/A 05/08/2018   Procedure: LEFT HEART CATH AND CORONARY ANGIOGRAPHY;  Surgeon: Belva Crome, MD;  Location: [place] [facility];  Service: Cardiovascular;  Laterality: N/A;  . LUMBAR [place]    . TONSILLECTOMY       Current Meds  Medication Sig  . clopidogrel (PLAVIX) 75 MG tablet Take 1 tablet (75 mg total) by mouth daily with breakfast.  . fluticasone (FLONASE) 50 MCG/ACT nasal spray Place 1 spray into both nostrils daily.  . isosorbide mononitrate (IMDUR) 30 MG 24 hr tablet Take  1 tablet (30 mg total) by mouth daily.  Marland Kitchen levothyroxine (SYNTHROID, LEVOTHROID) 88 MCG tablet Take 1 tablet (88 mcg total) by mouth daily.  Marland Kitchen loratadine (CLARITIN) 10 MG tablet Take 1 tablet (10 mg total) by mouth daily.  Marland Kitchen losartan (COZAAR) 25 MG tablet Take 1 tablet (25 mg total) by mouth daily.  Marland Kitchen lubiprostone (AMITIZA) 24 MCG capsule Take 1 capsule (24 mcg total) by mouth 2 (two) times daily with a meal.  . magnesium oxide (MAG-OX) 400 MG tablet Take 400 mg by mouth daily.  . metoprolol succinate (TOPROL-XL) 25 MG 24 hr tablet Take 1 tablet (25 mg total) by mouth daily.  . nitroGLYCERIN (NITROSTAT) 0.4 MG SL tablet Place 1 tablet (0.4 mg total) under the tongue every 5 (five) minutes as needed for  chest pain.  . pioglitazone (ACTOS) 15 MG tablet Take 1 tablet (15 mg total) by mouth daily.  . rosuvastatin (CRESTOR) 10 MG tablet Take 1 tablet (10 mg total) by mouth daily.  . Vitamin D, Ergocalciferol, (DRISDOL) 50000 units CAPS capsule TAKE 1 CAPSULE (50,000 UNITS TOTAL) BY MOUTH EVERY 7 (SEVEN) DAYS.  . [DISCONTINUED] Magnesium Oxide 400 MG CAPS Take 1 capsule (400 mg total) by mouth daily as needed. (Patient taking differently: Take 1 capsule by mouth daily. )     Allergies:   Aspirin; Gadolinium; Iohexol; Livalo [pitavastatin]; Hydrocodone-acetaminophen; and Lipitor [atorvastatin]   Social History   Tobacco Use  . Smoking status: Never Smoker  . Smokeless tobacco: Never Used  Substance Use Topics  . Alcohol use: No    Alcohol/week: 0.0 standard drinks  . Drug use: No     Family Hx: The patient's family history includes Breast cancer in her cousin and maternal aunt; COPD in her mother; Dementia in her mother; Diabetes in her mother; Heart failure in her father; Hypertension in her mother; Kidney disease in her mother; Lung disease in her mother. There is no history of Colon cancer.  ROS:   Please see the history of present illness.    All other systems reviewed and are negative.   Labs/Other Tests and Data Reviewed:    Recent Labs: 05/16/2018: Hemoglobin 12.5; Platelets 383 01/12/2019: ALT 66; BUN 12; Creatinine, Ser 0.79; Magnesium 1.7; Potassium 3.7; Sodium 139; TSH <0.01 Repeated and verified X2.   Recent Lipid Panel Lab Results  Component Value Date/Time   CHOL 109 06/27/2018 08:12 AM   TRIG 114 06/27/2018 08:12 AM   HDL 43 06/27/2018 08:12 AM   CHOLHDL 2.5 06/27/2018 08:12 AM   CHOLHDL 3.8 05/08/2018 11:55 AM   LDLCALC 43 06/27/2018 08:12 AM   LDLDIRECT 88.0 10/19/2016 08:44 AM   From [employer] Tool   Wt Readings from Last 3 Encounters:  02/27/19 187 lb 12.8 oz (85.2 kg)  01/12/19 186 lb 12 oz (84.7 kg)  11/06/18 189 lb (85.7 kg)     Exam:    Vital Signs:   BP 118/82   Pulse 79   Ht 5' 7"  (1.702 m)   Wt 187 lb 12.8 oz (85.2 kg)   BMI 29.41 kg/m     ASSESSMENT & PLAN:    Takotsubo cardiomyopathy EF was previously 45-50% the time of her presentation.  Most recent echocardiogram in October 2019 demonstrates return of normal LV function.  She is doing well without Sorbide, losartan, metoprolol succinate.  Coronary artery disease involving native coronary artery of native heart without angina pectoris Cardiac catheterization in June 2019 demonstrated 70% stenosis in the second diagonal and probable  distal occlusion.  Cardiomyopathy seem to be out of proportion to her coronary artery disease.  She has been managed medically.  She remains on clopidogrel, isosorbide and rosuvastatin.  She is not currently having any symptoms of angina.  Essential hypertension The patient's blood pressure is controlled on her current regimen.  Continue current therapy.   Hyperlipidemia LDL goal <70 LDL optimal on most recent lab work.  Continue current Rx.      COVID-19 Education: The signs and symptoms of COVID-19 were discussed with the patient and how to seek care for testing (follow up with PCP or arrange E-visit).  The importance of social distancing was discussed today.  Patient Risk:   After full review of this patients clinical status, I feel that they are at least moderate risk at this time.  Time:   Today, I have spent 8.5 minutes with the patient with telehealth technology discussing the above problems.     Medication Adjustments/Labs and Tests Ordered: Current medicines are reviewed at length with the patient today.  Concerns regarding medicines are outlined above.  Tests Ordered: No orders of the defined types were placed in this encounter.  Medication Changes: No orders of the defined types were placed in this encounter.   Disposition:  Follow up in 6 month(s)  Signed, Richardson Dopp, PA-C  02/27/2019 8:37 AM    [facility]  [facility]

## 2019-02-27 ENCOUNTER — Encounter: Payer: Self-pay | Admitting: Physician Assistant

## 2019-02-27 ENCOUNTER — Telehealth (INDEPENDENT_AMBULATORY_CARE_PROVIDER_SITE_OTHER): Payer: Medicare Other | Admitting: Physician Assistant

## 2019-02-27 ENCOUNTER — Other Ambulatory Visit: Payer: Self-pay

## 2019-02-27 VITALS — BP 118/82 | HR 79 | Ht 67.0 in | Wt 187.8 lb

## 2019-02-27 DIAGNOSIS — E785 Hyperlipidemia, unspecified: Secondary | ICD-10-CM

## 2019-02-27 DIAGNOSIS — I5181 Takotsubo syndrome: Secondary | ICD-10-CM

## 2019-02-27 DIAGNOSIS — I10 Essential (primary) hypertension: Secondary | ICD-10-CM

## 2019-02-27 DIAGNOSIS — I2510 Atherosclerotic heart disease of native coronary artery without angina pectoris: Secondary | ICD-10-CM

## 2019-02-27 NOTE — Patient Instructions (Signed)
Medication Instructions:  No changes.  Continue your current medications.  If you need a refill on your cardiac medications before your next appointment, please call your pharmacy.   Lab work: None  If you have labs (blood work) drawn today and your tests are completely normal, you will receive your results only by: Marland Kitchen MyChart Message (if you have MyChart) OR . A paper copy in the mail If you have any lab test that is abnormal or we need to change your treatment, we will call you to review the results.  Testing/Procedures: None  Follow-Up: At [facility], you and your health needs are our priority.  As part of our continuing mission to provide you with exceptional heart care, we have created designated Provider Care Teams.  These Care Teams include your primary Cardiologist (physician) and Advanced Practice Providers (APPs -  Physician Assistants and Nurse Practitioners) who all work together to provide you with the care you need, when you need it. You will need a follow up appointment in:  6 months.  Please call our office 2 months in advance to schedule this appointment.  You may see [name], MD or Richardson Dopp, PA-C   Any Other Special Instructions Will Be Listed Below (If Applicable).

## 2019-03-20 ENCOUNTER — Encounter: Payer: Self-pay | Admitting: Internal Medicine

## 2019-03-20 ENCOUNTER — Other Ambulatory Visit (INDEPENDENT_AMBULATORY_CARE_PROVIDER_SITE_OTHER): Payer: Medicare Other

## 2019-03-20 ENCOUNTER — Other Ambulatory Visit: Payer: Self-pay

## 2019-03-20 ENCOUNTER — Ambulatory Visit (INDEPENDENT_AMBULATORY_CARE_PROVIDER_SITE_OTHER): Payer: Medicare Other | Admitting: Internal Medicine

## 2019-03-20 VITALS — BP 128/78 | HR 68 | Temp 98.3°F | Ht 67.0 in | Wt 190.0 lb

## 2019-03-20 DIAGNOSIS — J069 Acute upper respiratory infection, unspecified: Secondary | ICD-10-CM

## 2019-03-20 DIAGNOSIS — K7581 Nonalcoholic steatohepatitis (NASH): Secondary | ICD-10-CM | POA: Diagnosis not present

## 2019-03-20 DIAGNOSIS — B9789 Other viral agents as the cause of diseases classified elsewhere: Secondary | ICD-10-CM

## 2019-03-20 DIAGNOSIS — E039 Hypothyroidism, unspecified: Secondary | ICD-10-CM | POA: Diagnosis not present

## 2019-03-20 DIAGNOSIS — I10 Essential (primary) hypertension: Secondary | ICD-10-CM | POA: Diagnosis not present

## 2019-03-20 DIAGNOSIS — K5904 Chronic idiopathic constipation: Secondary | ICD-10-CM

## 2019-03-20 LAB — COMPREHENSIVE METABOLIC PANEL
ALT: 17 U/L (ref 0–35)
AST: 24 U/L (ref 0–37)
Albumin: 4 g/dL (ref 3.5–5.2)
Alkaline Phosphatase: 94 U/L (ref 39–117)
BUN: 8 mg/dL (ref 6–23)
CO2: 28 mEq/L (ref 19–32)
Calcium: 9.3 mg/dL (ref 8.4–10.5)
Chloride: 100 mEq/L (ref 96–112)
Creatinine, Ser: 0.73 mg/dL (ref 0.40–1.20)
GFR: 95.98 mL/min (ref >60.00)
Glucose, Bld: 178 mg/dL — ABNORMAL HIGH (ref 70–99)
Potassium: 3.7 mEq/L (ref 3.5–5.1)
Sodium: 137 mEq/L (ref 135–145)
Total Bilirubin: 0.8 mg/dL (ref 0.2–1.2)
Total Protein: 7.3 g/dL (ref 6.0–8.3)

## 2019-03-20 LAB — TSH: TSH: 1.12 u[IU]/mL (ref 0.35–4.50)

## 2019-03-20 MED ORDER — LINACLOTIDE 145 MCG PO CAPS: 145.0000 ug | ORAL_CAPSULE | Freq: Every day | ORAL | 1 refills | Status: DC

## 2019-03-20 MED ORDER — DEXTROMETHORPHAN POLISTIREX ER 30 MG/5ML PO SUER: 30.0000 mg | Freq: Two times a day (BID) | ORAL | 0 refills | Status: DC | PRN

## 2019-03-20 NOTE — Patient Instructions (Signed)
Cough, Adult  Coughing is a reflex that clears your throat and your airways. Coughing helps to heal and protect your lungs. It is normal to cough occasionally, but a cough that happens with other symptoms or lasts a long time may be a sign of a condition that needs treatment. A cough may last only 2-3 weeks (acute), or it may last longer than 8 weeks (chronic). What are the causes? Coughing is commonly caused by:  Breathing in substances that irritate your lungs.  A viral or bacterial respiratory infection.  Allergies.  Asthma.  Postnasal drip.  Smoking.  Acid backing up from the stomach into the esophagus (gastroesophageal reflux).  Certain medicines.  Chronic lung problems, including COPD (or rarely, lung cancer).  Other medical conditions such as heart failure. Follow these instructions at home: Pay attention to any changes in your symptoms. Take these actions to help with your discomfort:  Take medicines only as told by your health care provider. ? If you were prescribed an antibiotic medicine, take it as told by your health care provider. Do not stop taking the antibiotic even if you start to feel better. ? Talk with your health care provider before you take a cough suppressant medicine.  Drink enough fluid to keep your urine clear or pale yellow.  If the air is dry, use a cold steam vaporizer or humidifier in your bedroom or your home to help loosen secretions.  Avoid anything that causes you to cough at work or at home.  If your cough is worse at night, try sleeping in a semi-upright position.  Avoid cigarette smoke. If you smoke, quit smoking. If you need help quitting, ask your health care provider.  Avoid caffeine.  Avoid alcohol.  Rest as needed. Contact a health care provider if:  You have new symptoms.  You cough up pus.  Your cough does not get better after 2-3 weeks, or your cough gets worse.  You cannot control your cough with suppressant  medicines and you are losing sleep.  You develop pain that is getting worse or pain that is not controlled with pain medicines.  You have a fever.  You have unexplained weight loss.  You have night sweats. Get help right away if:  You cough up blood.  You have difficulty breathing.  Your heartbeat is very fast. This information is not intended to replace advice given to you by your health care provider. Make sure you discuss any questions you have with your health care provider. Document Released: 05/14/2011 Document Revised: 04/22/2016 Document Reviewed: 01/22/2015 Elsevier Interactive Patient Education  2019 [employer]  

## 2019-03-20 NOTE — Progress Notes (Signed)
Subjective:  Patient ID: [name], female    DOB: 08/06/1951  Age: 68 y.o. MRN: [id number]  CC: Hypothyroidism and Hypertension   HPI ICESIS RENN presents for f/up - She continues to complain of constipation.  She tried Amitiza but says it caused vague side effects.  She wants to go back to taking Linzess.  She denies fatigue, DOE, CP, SOB, palpitations, edema, or weight changes.  She complains of a one-week history of cough that is productive of thin yellow phlegm.  She denies fever, chills, night sweats, shortness of breath, or wheezing.  Outpatient Medications Prior to Visit  Medication Sig Dispense Refill  . clopidogrel (PLAVIX) 75 MG tablet Take 1 tablet (75 mg total) by mouth daily with breakfast. 90 tablet 3  . fluticasone (FLONASE) 50 MCG/ACT nasal spray Place 1 spray into both nostrils daily. 16 g 2  . isosorbide mononitrate (IMDUR) 30 MG 24 hr tablet Take 1 tablet (30 mg total) by mouth daily. 90 tablet 3  . levothyroxine (SYNTHROID, LEVOTHROID) 88 MCG tablet Take 1 tablet (88 mcg total) by mouth daily. 90 tablet 0  . loratadine (CLARITIN) 10 MG tablet Take 1 tablet (10 mg total) by mouth daily. 30 tablet 3  . losartan (COZAAR) 25 MG tablet Take 1 tablet (25 mg total) by mouth daily. 90 tablet 3  . magnesium oxide (MAG-OX) 400 MG tablet Take 400 mg by mouth daily.    . metoprolol succinate (TOPROL-XL) 25 MG 24 hr tablet Take 1 tablet (25 mg total) by mouth daily. 90 tablet 3  . nitroGLYCERIN (NITROSTAT) 0.4 MG SL tablet Place 1 tablet (0.4 mg total) under the tongue every 5 (five) minutes as needed for chest pain. 25 tablet 1  . pioglitazone (ACTOS) 15 MG tablet Take 1 tablet (15 mg total) by mouth daily. 90 tablet 1  . Vitamin D, Ergocalciferol, (DRISDOL) 50000 units CAPS capsule TAKE 1 CAPSULE (50,000 UNITS TOTAL) BY MOUTH EVERY 7 (SEVEN) DAYS. 12 capsule 0  . lubiprostone (AMITIZA) 24 MCG capsule Take 1 capsule (24 mcg total) by mouth 2 (two) times daily with a meal. 180  capsule 1  . rosuvastatin (CRESTOR) 10 MG tablet Take 1 tablet (10 mg total) by mouth daily. 90 tablet 3   No facility-administered medications prior to visit.     ROS Review of Systems  Constitutional: Negative for appetite change, diaphoresis, fatigue and unexpected weight change.  HENT: Negative.   Eyes: Negative for visual disturbance.  Respiratory: Negative for cough, chest tightness, shortness of breath and wheezing.   Cardiovascular: Negative for chest pain, palpitations and leg swelling.  Gastrointestinal: Positive for constipation. Negative for abdominal pain, diarrhea, nausea and vomiting.  Endocrine: Negative for cold intolerance and heat intolerance.  Genitourinary: Negative.  Negative for difficulty urinating.  Musculoskeletal: Negative.  Negative for arthralgias and myalgias.  Skin: Negative.  Negative for color change.  Neurological: Negative.  Negative for dizziness, weakness, light-headedness and headaches.  Hematological: Negative for adenopathy. Does not bruise/bleed easily.  Psychiatric/Behavioral: Negative.     Objective:  BP 128/78 (BP Location: Left Arm, Patient Position: Sitting, Cuff Size: Large)   Pulse 68   Temp 98.3 F (36.8 C) (Oral)   Ht 5' 7"  (1.702 m)   Wt 190 lb (86.2 kg)   SpO2 99%   BMI 29.76 kg/m   BP Readings from Last 3 Encounters:  03/20/19 128/78  02/27/19 118/82  01/12/19 126/70    Wt Readings from Last 3 Encounters:  03/20/19 190  lb (86.2 kg)  02/27/19 187 lb 12.8 oz (85.2 kg)  01/12/19 186 lb 12 oz (84.7 kg)    Physical Exam Vitals signs reviewed.  Constitutional:      Appearance: She is not ill-appearing or diaphoretic.  HENT:     Nose: Nose normal. No congestion or rhinorrhea.     Mouth/Throat:     Mouth: Mucous membranes are moist.     Pharynx: Oropharynx is clear. No oropharyngeal exudate or posterior oropharyngeal erythema.  Eyes:     General: No scleral icterus.    Conjunctiva/sclera: Conjunctivae normal.   Neck:     Musculoskeletal: Normal range of motion and neck supple. No muscular tenderness.  Cardiovascular:     Rate and Rhythm: Normal rate and regular rhythm.     Heart sounds: No murmur.  Pulmonary:     Effort: Pulmonary effort is normal. No respiratory distress.     Breath sounds: No stridor. No wheezing, rhonchi or rales.  Abdominal:     General: Abdomen is flat. Bowel sounds are normal. There is no distension.     Palpations: There is no hepatomegaly, splenomegaly or mass.     Tenderness: There is no abdominal tenderness. There is no guarding.  Musculoskeletal: Normal range of motion.     Right lower leg: No edema.     Left lower leg: No edema.  Lymphadenopathy:     Cervical: No cervical adenopathy.  Skin:    General: Skin is warm and dry.     Coloration: Skin is not jaundiced or pale.  Neurological:     General: No focal deficit present.     Lab Results  Component Value Date   WBC 8.4 05/16/2018   HGB 12.5 05/16/2018   HCT 36.8 05/16/2018   PLT 383 05/16/2018   GLUCOSE 178 (H) 03/20/2019   CHOL 109 06/27/2018   TRIG 114 06/27/2018   HDL 43 06/27/2018   LDLDIRECT 88.0 10/19/2016   LDLCALC 43 06/27/2018   ALT 17 03/20/2019   AST 24 03/20/2019   NA 137 03/20/2019   K 3.7 03/20/2019   CL 100 03/20/2019   CREATININE 0.73 03/20/2019   BUN 8 03/20/2019   CO2 28 03/20/2019   TSH 1.12 03/20/2019   INR 1.01 05/08/2018   HGBA1C 6.0 01/12/2019    US Abdomen Limited Ruq  Result Date: 01/22/2019 CLINICAL DATA:  Elevated liver enzymes EXAM: ULTRASOUND ABDOMEN LIMITED RIGHT UPPER QUADRANT COMPARISON:  None. FINDINGS: Gallbladder: Surgically absent. Common bile duct: Diameter: 4 mm. No intrahepatic or extrahepatic biliary duct dilatation. Liver: No focal lesion identified. Liver echogenicity is increased diffusely. Portal vein is patent on color Doppler imaging with normal direction of blood flow towards the liver. IMPRESSION: Diffuse increase in liver echogenicity, a  finding indicative of hepatic steatosis. While no focal liver lesions are evident on this study, it must be cautioned that the sensitivity of ultrasound for detection of focal liver lesions is diminished in this circumstance. Gallbladder absent. Electronically Signed   By: Lowella Grip III M.D.   On: 01/22/2019 13:40    Assessment & Plan:   Madelon was seen today for hypothyroidism and hypertension.  Diagnoses and all orders for this visit:  Essential hypertension- He blood pressure is well controlled.  Electrolytes and renal function are normal. -     Comprehensive metabolic panel; Future  NASH (nonalcoholic steatohepatitis)- Her LFTs are normal now that she has started pioglitazone.  Will continue. -     Comprehensive metabolic panel; Future  Acquired hypothyroidism- Her TSH is in the normal range.  She will remain on the current dose of levothyroxine. -     TSH; Future  Chronic idiopathic constipation -     linaclotide (LINZESS) 145 MCG CAPS capsule; Take 1 capsule (145 mcg total) by mouth daily before breakfast.  Viral URI with cough -     dextromethorphan (DELSYM) 30 MG/5ML liquid; Take 5 mLs (30 mg total) by mouth 2 (two) times daily between meals as needed for cough.   I have discontinued Adilynne Fitzwater. Streiff's lubiprostone. I am also having her start on linaclotide and dextromethorphan. Additionally, I am having her maintain her isosorbide mononitrate, losartan, metoprolol succinate, nitroGLYCERIN, clopidogrel, fluticasone, loratadine, rosuvastatin, Vitamin D (Ergocalciferol), levothyroxine, pioglitazone, and magnesium oxide.  Meds ordered this encounter  Medications  . linaclotide (LINZESS) 145 MCG CAPS capsule    Sig: Take 1 capsule (145 mcg total) by mouth daily before breakfast.    Dispense:  90 capsule    Refill:  1  . dextromethorphan (DELSYM) 30 MG/5ML liquid    Sig: Take 5 mLs (30 mg total) by mouth 2 (two) times daily between meals as needed for cough.    Dispense:  89  mL    Refill:  0     Follow-up: Return if symptoms worsen or fail to improve.  [name], MD

## 2019-04-10 ENCOUNTER — Other Ambulatory Visit: Payer: Self-pay | Admitting: Internal Medicine

## 2019-04-10 DIAGNOSIS — E039 Hypothyroidism, unspecified: Secondary | ICD-10-CM

## 2019-04-24 ENCOUNTER — Other Ambulatory Visit: Payer: Self-pay | Admitting: Cardiovascular Disease

## 2019-04-24 MED ORDER — CLOPIDOGREL BISULFATE 75 MG PO TABS: 75.0000 mg | ORAL_TABLET | Freq: Every day | ORAL | 3 refills | Status: DC

## 2019-04-24 MED ORDER — METOPROLOL SUCCINATE ER 25 MG PO TB24: 25.0000 mg | ORAL_TABLET | Freq: Every day | ORAL | 3 refills | Status: DC

## 2019-04-24 MED ORDER — LOSARTAN POTASSIUM 25 MG PO TABS: 25.0000 mg | ORAL_TABLET | Freq: Every day | ORAL | 3 refills | Status: DC

## 2019-04-24 MED ORDER — ISOSORBIDE MONONITRATE ER 30 MG PO TB24: 30.0000 mg | ORAL_TABLET | Freq: Every day | ORAL | 3 refills | Status: DC

## 2019-04-24 NOTE — Addendum Note (Signed)
Addended by: Derl Barrow on: 04/24/2019 12:51 PM   Modules accepted: Orders

## 2019-04-24 NOTE — Addendum Note (Signed)
Addended by: Derl Barrow on: 04/24/2019 12:56 PM   Modules accepted: Orders

## 2019-04-26 DIAGNOSIS — M2550 Pain in unspecified joint: Secondary | ICD-10-CM | POA: Diagnosis not present

## 2019-04-26 DIAGNOSIS — R768 Other specified abnormal immunological findings in serum: Secondary | ICD-10-CM | POA: Diagnosis not present

## 2019-05-02 DIAGNOSIS — H40029 Open angle with borderline findings, high risk, unspecified eye: Secondary | ICD-10-CM | POA: Diagnosis not present

## 2019-05-14 ENCOUNTER — Ambulatory Visit: Payer: Medicare Other | Admitting: Internal Medicine

## 2019-05-21 ENCOUNTER — Ambulatory Visit (INDEPENDENT_AMBULATORY_CARE_PROVIDER_SITE_OTHER): Payer: Medicare Other | Admitting: Internal Medicine

## 2019-05-21 ENCOUNTER — Other Ambulatory Visit: Payer: Self-pay

## 2019-05-21 ENCOUNTER — Other Ambulatory Visit (INDEPENDENT_AMBULATORY_CARE_PROVIDER_SITE_OTHER): Payer: Medicare Other

## 2019-05-21 ENCOUNTER — Encounter: Payer: Self-pay | Admitting: Internal Medicine

## 2019-05-21 VITALS — BP 138/86 | HR 62 | Temp 98.6°F | Resp 16 | Ht 67.0 in | Wt 189.8 lb

## 2019-05-21 DIAGNOSIS — K7581 Nonalcoholic steatohepatitis (NASH): Secondary | ICD-10-CM

## 2019-05-21 DIAGNOSIS — Z0000 Encounter for general adult medical examination without abnormal findings: Secondary | ICD-10-CM

## 2019-05-21 DIAGNOSIS — E559 Vitamin D deficiency, unspecified: Secondary | ICD-10-CM | POA: Diagnosis not present

## 2019-05-21 DIAGNOSIS — E785 Hyperlipidemia, unspecified: Secondary | ICD-10-CM | POA: Diagnosis not present

## 2019-05-21 DIAGNOSIS — I10 Essential (primary) hypertension: Secondary | ICD-10-CM

## 2019-05-21 DIAGNOSIS — R7303 Prediabetes: Secondary | ICD-10-CM

## 2019-05-21 DIAGNOSIS — E039 Hypothyroidism, unspecified: Secondary | ICD-10-CM

## 2019-05-21 DIAGNOSIS — E781 Pure hyperglyceridemia: Secondary | ICD-10-CM

## 2019-05-21 LAB — CBC WITH DIFFERENTIAL/PLATELET
Basophils Absolute: 0.1 10*3/uL (ref 0.0–0.1)
Basophils Relative: 1.2 % (ref 0.0–3.0)
Eosinophils Absolute: 0.1 10*3/uL (ref 0.0–0.7)
Eosinophils Relative: 1.8 % (ref 0.0–5.0)
HCT: 39 % (ref 36.0–46.0)
Hemoglobin: 13.1 g/dL (ref 12.0–15.0)
Lymphocytes Relative: 31.6 % (ref 12.0–46.0)
Lymphs Abs: 2.5 10*3/uL (ref 0.7–4.0)
MCHC: 33.7 g/dL (ref 30.0–36.0)
MCV: 81.7 fl (ref 78.0–100.0)
Monocytes Absolute: 0.6 10*3/uL (ref 0.1–1.0)
Monocytes Relative: 7.1 % (ref 3.0–12.0)
Neutro Abs: 4.7 10*3/uL (ref 1.4–7.7)
Neutrophils Relative %: 58.3 % (ref 43.0–77.0)
Platelets: 267 10*3/uL (ref 150.0–400.0)
RBC: 4.77 Mil/uL (ref 3.87–5.11)
RDW: 13.8 % (ref 11.5–15.5)
WBC: 8 10*3/uL (ref 4.0–10.5)

## 2019-05-21 LAB — BASIC METABOLIC PANEL
BUN: 7 mg/dL (ref 6–23)
CO2: 27 mEq/L (ref 19–32)
Calcium: 9.3 mg/dL (ref 8.4–10.5)
Chloride: 102 mEq/L (ref 96–112)
Creatinine, Ser: 0.75 mg/dL (ref 0.40–1.20)
GFR: 92.99 mL/min (ref >60.00)
Glucose, Bld: 98 mg/dL (ref 70–99)
Potassium: 3.5 mEq/L (ref 3.5–5.1)
Sodium: 139 mEq/L (ref 135–145)

## 2019-05-21 LAB — HEPATIC FUNCTION PANEL
ALT: 34 U/L (ref 0–35)
AST: 44 U/L — ABNORMAL HIGH (ref 0–37)
Albumin: 4.1 g/dL (ref 3.5–5.2)
Alkaline Phosphatase: 87 U/L (ref 39–117)
Bilirubin, Direct: 0.3 mg/dL (ref 0.0–0.3)
Total Bilirubin: 1.6 mg/dL — ABNORMAL HIGH (ref 0.2–1.2)
Total Protein: 7.2 g/dL (ref 6.0–8.3)

## 2019-05-21 LAB — LIPID PANEL
Cholesterol: 123 mg/dL (ref 0–200)
HDL: 43.9 mg/dL (ref >39.00)
LDL Cholesterol: 55 mg/dL (ref 0–99)
NonHDL: 79.46
Total CHOL/HDL Ratio: 3
Triglycerides: 122 mg/dL (ref 0.0–149.0)
VLDL: 24.4 mg/dL (ref 0.0–40.0)

## 2019-05-21 LAB — MAGNESIUM: Magnesium: 1.9 mg/dL (ref 1.5–2.5)

## 2019-05-21 LAB — TSH: TSH: 1.52 u[IU]/mL (ref 0.35–4.50)

## 2019-05-21 LAB — HEMOGLOBIN A1C: Hgb A1c MFr Bld: 6.1 % (ref 4.6–6.5)

## 2019-05-21 LAB — VITAMIN D 25 HYDROXY (VIT D DEFICIENCY, FRACTURES): VITD: 51.29 ng/mL (ref 30.00–100.00)

## 2019-05-21 NOTE — Progress Notes (Signed)
Subjective:  Patient ID: [name], female    DOB: 04-11-1951  Age: 68 y.o. MRN: [id number]  CC: Hypertension, Hypothyroidism, and Hyperlipidemia   HPI XUAN MATEUS presents for f/up - She has decided not to take pioglitazone.  She has been working on her lifestyle modifications.  She has felt well recently and offers no complaints.  Outpatient Medications Prior to Visit  Medication Sig Dispense Refill  . clopidogrel (PLAVIX) 75 MG tablet Take 1 tablet (75 mg total) by mouth daily with breakfast. 90 tablet 3  . fluticasone (FLONASE) 50 MCG/ACT nasal spray Place 1 spray into both nostrils daily. 16 g 2  . isosorbide mononitrate (IMDUR) 30 MG 24 hr tablet Take 1 tablet (30 mg total) by mouth daily. 90 tablet 3  . levothyroxine (SYNTHROID) 88 MCG tablet TAKE 1 TABLET BY MOUTH EVERY DAY 90 tablet 1  . linaclotide (LINZESS) 145 MCG CAPS capsule Take 1 capsule (145 mcg total) by mouth daily before breakfast. 90 capsule 1  . losartan (COZAAR) 25 MG tablet Take 1 tablet (25 mg total) by mouth daily. 90 tablet 3  . magnesium oxide (MAG-OX) 400 MG tablet Take 400 mg by mouth daily.    . metoprolol succinate (TOPROL-XL) 25 MG 24 hr tablet Take 1 tablet (25 mg total) by mouth daily. 90 tablet 3  . nitroGLYCERIN (NITROSTAT) 0.4 MG SL tablet Place 1 tablet (0.4 mg total) under the tongue every 5 (five) minutes as needed for chest pain. 25 tablet 1  . Vitamin D, Ergocalciferol, (DRISDOL) 50000 units CAPS capsule TAKE 1 CAPSULE (50,000 UNITS TOTAL) BY MOUTH EVERY 7 (SEVEN) DAYS. 12 capsule 0  . dextromethorphan (DELSYM) 30 MG/5ML liquid Take 5 mLs (30 mg total) by mouth 2 (two) times daily between meals as needed for cough. 89 mL 0  . loratadine (CLARITIN) 10 MG tablet Take 1 tablet (10 mg total) by mouth daily. 30 tablet 3  . pioglitazone (ACTOS) 15 MG tablet Take 1 tablet (15 mg total) by mouth daily. 90 tablet 1  . rosuvastatin (CRESTOR) 10 MG tablet Take 1 tablet (10 mg total) by mouth daily. 90  tablet 3   No facility-administered medications prior to visit.     ROS Review of Systems  Constitutional: Negative for appetite change, diaphoresis, fatigue and unexpected weight change.  HENT: Negative.  Negative for sore throat and trouble swallowing.   Eyes: Negative for visual disturbance.  Respiratory: Negative for cough, chest tightness, shortness of breath and wheezing.   Cardiovascular: Negative for chest pain, palpitations and leg swelling.  Gastrointestinal: Negative for abdominal pain, constipation, diarrhea, nausea and vomiting.  Endocrine: Negative.  Negative for cold intolerance and heat intolerance.  Genitourinary: Negative.  Negative for difficulty urinating.  Musculoskeletal: Negative.  Negative for arthralgias and myalgias.  Skin: Negative.   Neurological: Negative.  Negative for dizziness, weakness, light-headedness and headaches.  Hematological: Negative for adenopathy. Does not bruise/bleed easily.  Psychiatric/Behavioral: Negative.     Objective:  BP 138/86 (BP Location: Left Arm, Patient Position: Sitting, Cuff Size: Normal)   Pulse 62   Temp 98.6 F (37 C) (Oral)   Resp 16   Ht 5' 7"  (1.702 m)   Wt 189 lb 12 oz (86.1 kg)   SpO2 97%   BMI 29.72 kg/m   BP Readings from Last 3 Encounters:  05/21/19 138/86  03/20/19 128/78  02/27/19 118/82    Wt Readings from Last 3 Encounters:  05/21/19 189 lb 12 oz (86.1 kg)  03/20/19  190 lb (86.2 kg)  02/27/19 187 lb 12.8 oz (85.2 kg)    Physical Exam Vitals signs reviewed.  Constitutional:      Appearance: She is not ill-appearing or diaphoretic.  HENT:     Nose: Nose normal. No congestion.     Mouth/Throat:     Mouth: Mucous membranes are moist.  Eyes:     General: No scleral icterus.    Conjunctiva/sclera: Conjunctivae normal.  Neck:     Musculoskeletal: Normal range of motion and neck supple. No neck rigidity.  Cardiovascular:     Rate and Rhythm: Normal rate and regular rhythm.     Heart  sounds: No murmur. No gallop.   Pulmonary:     Effort: Pulmonary effort is normal.     Breath sounds: No stridor. No wheezing, rhonchi or rales.  Abdominal:     General: Abdomen is protuberant. Bowel sounds are normal. There is no distension.     Palpations: There is no mass.     Tenderness: There is no abdominal tenderness.  Genitourinary:    Comments: Breast, GU, rectal exams were deferred at her request. Musculoskeletal: Normal range of motion.     Right lower leg: No edema.     Left lower leg: No edema.  Lymphadenopathy:     Cervical: No cervical adenopathy.  Skin:    General: Skin is warm and dry.     Coloration: Skin is not pale.  Neurological:     General: No focal deficit present.     Mental Status: She is alert and oriented to person, place, and time. Mental status is at baseline.  Psychiatric:        Mood and Affect: Mood normal.        Behavior: Behavior normal.     Lab Results  Component Value Date   WBC 8.0 05/21/2019   HGB 13.1 05/21/2019   HCT 39.0 05/21/2019   PLT 267.0 05/21/2019   GLUCOSE 98 05/21/2019   CHOL 123 05/21/2019   TRIG 122.0 05/21/2019   HDL 43.90 05/21/2019   LDLDIRECT 88.0 10/19/2016   LDLCALC 55 05/21/2019   ALT 34 05/21/2019   AST 44 (H) 05/21/2019   NA 139 05/21/2019   K 3.5 05/21/2019   CL 102 05/21/2019   CREATININE 0.75 05/21/2019   BUN 7 05/21/2019   CO2 27 05/21/2019   TSH 1.52 05/21/2019   INR 1.01 05/08/2018   HGBA1C 6.1 05/21/2019    US Abdomen Limited Ruq  Result Date: 01/22/2019 CLINICAL DATA:  Elevated liver enzymes EXAM: ULTRASOUND ABDOMEN LIMITED RIGHT UPPER QUADRANT COMPARISON:  None. FINDINGS: Gallbladder: Surgically absent. Common bile duct: Diameter: 4 mm. No intrahepatic or extrahepatic biliary duct dilatation. Liver: No focal lesion identified. Liver echogenicity is increased diffusely. Portal vein is patent on color Doppler imaging with normal direction of blood flow towards the liver. IMPRESSION: Diffuse  increase in liver echogenicity, a finding indicative of hepatic steatosis. While no focal liver lesions are evident on this study, it must be cautioned that the sensitivity of ultrasound for detection of focal liver lesions is diminished in this circumstance. Gallbladder absent. Electronically Signed   By: Lowella Grip III M.D.   On: 01/22/2019 13:40    Assessment & Plan:   Dempsey was seen today for hypertension, hypothyroidism and hyperlipidemia.  Diagnoses and all orders for this visit:  Prediabetes- She is prediabetic.  Medical therapy is not indicated. -     Hemoglobin A1c; Future  Pure hyperglyceridemia- Improvement noted -  Lipid panel; Future  Hyperlipidemia LDL goal <70- She has achieved her LDL goal is doing well on the statin. -     Lipid panel; Future  Acquired hypothyroidism- Her TSH is in the normal range.  She will remain on the current dose of levothyroxine. -     TSH; Future  NASH (nonalcoholic steatohepatitis)- Her LFTs remain slightly elevated but she is not willing to take pioglitazone.  She will continue to work on her lifestyle modifications. -     Hepatic function panel; Future  Vitamin D deficiency -     VITAMIN D 25 Hydroxy (Vit-D Deficiency, Fractures); Future  Routine general medical examination at a health care facility-exam completed  Essential hypertension- Her blood pressure is adequately well controlled. -     Magnesium; Future -     CBC with Differential/Platelet; Future -     Basic metabolic panel; Future   I have discontinued Doree Kuehne. Moralez's loratadine, pioglitazone, and dextromethorphan. I am also having her maintain her nitroGLYCERIN, fluticasone, rosuvastatin, Vitamin D (Ergocalciferol), magnesium oxide, linaclotide, levothyroxine, metoprolol succinate, clopidogrel, isosorbide mononitrate, and losartan.  No orders of the defined types were placed in this encounter.    Follow-up: Return in about 6 months (around 11/20/2019).  [name], MD

## 2019-05-21 NOTE — Progress Notes (Addendum)
Subjective:   [name] is a 68 y.o. female who presents for Medicare Annual (Subsequent) preventive examination.  Review of Systems:   Cardiac Risk Factors include: advanced age (>51mn, >>100women);hypertension;dyslipidemia Sleep patterns: no sleep issues, feels rested on waking, gets up 0-1 times nightly to void and sleeps 8-9 hours nightly.    Home Safety/Smoke Alarms: Feels safe in home. Smoke alarms in place.  Living environment; residence and Firearm Safety: 1-story house/ trailer Lives alone, no needs for DME, good support system. Seat Belt Safety/Bike Helmet: Wears seat belt.     Objective:     Vitals: BP (!) 141/72   Pulse 63   Resp 18   Ht 5' 7"  (1.702 m)   Wt 189 lb (85.7 kg)   SpO2 99%   BMI 29.60 kg/m   Body mass index is 29.6 kg/m.  Advanced Directives 05/22/2019 05/13/2018 05/08/2018 05/08/2018 01/03/2017 12/20/2016 01/07/2016  Does Patient Have a Medical Advance Directive? No No No No No No No  Would patient like information on creating a medical advance directive? No - Patient declined No - Patient declined Yes (ED - Information included in AVS) Yes (ED - Information included in AVS) - - No - patient declined information    Tobacco Social History   Tobacco Use  Smoking Status Never Smoker  Smokeless Tobacco Never Used     Counseling given: Not Answered  Past Medical History:  Diagnosis Date  . Anemia   . Arthritis   . Cholelithiasis   . Chronic back pain   . Constipation   . Esophageal reflux   . Fibromyalgia   . Hyperlipidemia   . Hypertension   . Hypothyroid   . IBS (irritable bowel syndrome)   . Internal hemorrhoids without mention of complication   . Left leg pain   . Personal history of colonic polyps 05/06/2010   ADENOMATOUS POLYP   Past Surgical History:  Procedure Laterality Date  . ABDOMINAL HYSTERECTOMY    . CERVICAL DISC SURGERY     PLATE IN NECK & BACK  . CHOLECYSTECTOMY    . COLONOSCOPY  09/27/2011   NORMAL   . LEFT HEART  CATH AND CORONARY ANGIOGRAPHY N/A 05/08/2018   Procedure: LEFT HEART CATH AND CORONARY ANGIOGRAPHY;  Surgeon: SBelva Crome MD;  Location: [facility];  Service: Cardiovascular;  Laterality: N/A;  . LUMBAR [place]   . TONSILLECTOMY     Family History  Problem Relation Age of Onset  . Diabetes Mother        aunts and uncles  . Kidney disease Mother        stage 3  . Lung disease Mother        colapsed lung/in hospice  . COPD Mother   . Hypertension Mother   . Dementia Mother   . Breast cancer Maternal Aunt   . Heart failure Father        Mother  . Breast cancer Cousin   . Colon cancer Neg Hx    Social History   Socioeconomic History  . Marital status: Widowed    Spouse name: Not on file  . Number of children: 2  . Years of education: Not on file  . Highest education level: Not on file  Occupational History  . Occupation: housewife    Employer: UNEMPLOYED   Social Needs  . Financial resource strain: Not hard at all  . Food insecurity    Worry: Never true  Inability: Never true  . Transportation needs    Medical: No    Non-medical: No  Tobacco Use  . Smoking status: Never Smoker  . Smokeless tobacco: Never Used  Substance and Sexual Activity  . Alcohol use: No    Alcohol/week: 0.0 standard drinks  . Drug use: No  . Sexual activity: Not Currently  Lifestyle  . Physical activity    Days per week: 0 days    Minutes per session: 0 min  . Stress: Not at all  Relationships  . Social connections    Talks on phone: More than three times a week    Gets together: More than three times a week    Attends religious service: More than 4 times per year    Active member of club or organization: Yes    Attends meetings of clubs or organizations: More than 4 times per year    Relationship status: Married  Other Topics Concern  . Not on file  Social History Narrative   Daily Caffeine Use    Outpatient Encounter Medications as of 05/22/2019  Medication Sig   . clopidogrel (PLAVIX) 75 MG tablet Take 1 tablet (75 mg total) by mouth daily with breakfast.  . fluticasone (FLONASE) 50 MCG/ACT nasal spray Place 1 spray into both nostrils daily.  . isosorbide mononitrate (IMDUR) 30 MG 24 hr tablet Take 1 tablet (30 mg total) by mouth daily.  Marland Kitchen levothyroxine (SYNTHROID) 88 MCG tablet TAKE 1 TABLET BY MOUTH EVERY DAY  . linaclotide (LINZESS) 145 MCG CAPS capsule Take 1 capsule (145 mcg total) by mouth daily before breakfast.  . losartan (COZAAR) 25 MG tablet Take 1 tablet (25 mg total) by mouth daily.  . magnesium oxide (MAG-OX) 400 MG tablet Take 400 mg by mouth daily.  . metoprolol succinate (TOPROL-XL) 25 MG 24 hr tablet Take 1 tablet (25 mg total) by mouth daily.  . nitroGLYCERIN (NITROSTAT) 0.4 MG SL tablet Place 1 tablet (0.4 mg total) under the tongue every 5 (five) minutes as needed for chest pain.  . Vitamin D, Ergocalciferol, (DRISDOL) 50000 units CAPS capsule TAKE 1 CAPSULE (50,000 UNITS TOTAL) BY MOUTH EVERY 7 (SEVEN) DAYS.  Marland Kitchen rosuvastatin (CRESTOR) 10 MG tablet Take 1 tablet (10 mg total) by mouth daily.   No facility-administered encounter medications on file as of 05/22/2019.     Activities of Daily Living In your present state of health, do you have any difficulty performing the following activities: 05/22/2019  Hearing? N  Vision? N  Difficulty concentrating or making decisions? N  Walking or climbing stairs? N  Dressing or bathing? N  Doing errands, shopping? N  Preparing Food and eating ? N  Using the Toilet? N  In the past six months, have you accidently leaked urine? N  Do you have problems with loss of bowel control? N  Managing your Medications? N  Managing your Finances? N  Housekeeping or managing your Housekeeping? N  Some recent data might be hidden    Patient Care Team: [name], MD as PCP - Cyndia Diver, MD as PCP - Cardiology (Cardiology)    Assessment:   This is a routine wellness examination  for [name]. Physical assessment deferred to PCP.   Exercise Activities and Dietary recommendations Current Exercise Habits: The patient does not participate in regular exercise at present([employer] TV exercise program for senior resource provided), Exercise limited by: None identified  Diet (meal preparation, eat out, water intake, caffeinated beverages, dairy products, fruits and  vegetables): in general, an "unhealthy" diet, on average, 1-2 meals per day, on average, 4-5 fast food meals per week   Reviewed heart healthy diet. Encouraged patient to limit eating fast food and to not skip meals.  Discussed supplementing with Ensure or Boost and to increase healthier foods with protein like cottage cheese and yogurt. Encouraged patient to increase daily water and healthy fluid intake. Diet education was attached to patient's AVS.  Goals    . Patient Stated     I want to continue to eat healthy and exercise. Enjoy life, family and worship God.    . Patient Stated     I want to eat healthier by eating more vegetables and drink more water.       Fall Risk Fall Risk  05/22/2019 05/21/2019 07/11/2018 05/08/2018 07/12/2017  Falls in the past year? 0 0 No No No  Number falls in past yr: 0 0 - - -  Injury with Fall? - 0 - - -  Follow up - Falls evaluation completed - - -    Depression Screen PHQ 2/9 Scores 05/22/2019 10/19/2018 07/11/2018 05/08/2018  PHQ - 2 Score 0 0 0 0  PHQ- 9 Score - - - 0     Cognitive Function       Ad8 score reviewed for issues:  Issues making decisions: no  Less interest in hobbies / activities: no  Repeats questions, stories (family complaining): no  Trouble using ordinary gadgets (microwave, computer, phone):no  Forgets the month or year: no  Mismanaging finances: no  Remembering appts: no  Daily problems with thinking and/or memory: no Ad8 score is= 0  Immunization History  Administered Date(s) Administered  . Influenza Split 11/15/2011  . Influenza  Whole 12/11/2009  . Influenza, High Dose Seasonal PF 10/19/2016, 12/01/2017, 11/06/2018  . Influenza,inj,Quad PF,6+ Mos 08/10/2013, 09/18/2015  . Pneumococcal Conjugate-13 06/14/2016  . Pneumococcal Polysaccharide-23 07/20/2013, 01/12/2019  . Td 06/23/2010  . Zoster 11/06/2013   Screening Tests Health Maintenance  Topic Date Due  . INFLUENZA VACCINE  06/30/2019  . COLONOSCOPY  01/04/2020  . TETANUS/TDAP  06/23/2020  . MAMMOGRAM  08/22/2020  . DEXA SCAN  Completed  . Hepatitis C Screening  Completed  . PNA vac Low Risk Adult  Completed      Plan:    Reviewed health maintenance screenings with patient today and relevant education, vaccines, and/or referrals were provided.   Continue to eat heart healthy diet (full of fruits, vegetables, whole grains, lean protein, water--limit salt, fat, and sugar intake) and increase physical activity as tolerated.  Continue doing brain stimulating activities (puzzles, reading, adult coloring books, staying active) to keep memory sharp.   I have personally reviewed and noted the following in the patient's chart:   . Medical and social history . Use of alcohol, tobacco or illicit drugs  . Current medications and supplements . Functional ability and status . Nutritional status . Physical activity . Advanced directives . List of other physicians . Vitals . Screenings to include cognitive, depression, and falls . Referrals and appointments  In addition, I have reviewed and discussed with patient certain preventive protocols, quality metrics, and best practice recommendations. A written personalized care plan for preventive services as well as general preventive health recommendations were provided to patient.     Michiel Cowboy, RN  05/22/2019  Medical screening examination/treatment/procedure(s) were performed by non-physician practitioner and as supervising physician I was immediately available for consultation/collaboration. I agree with  above. [name],  MD

## 2019-05-21 NOTE — Patient Instructions (Signed)

## 2019-05-22 ENCOUNTER — Other Ambulatory Visit: Payer: Self-pay

## 2019-05-22 ENCOUNTER — Ambulatory Visit (INDEPENDENT_AMBULATORY_CARE_PROVIDER_SITE_OTHER): Payer: Medicare Other | Admitting: *Deleted

## 2019-05-22 VITALS — BP 141/72 | HR 63 | Resp 18 | Ht 67.0 in | Wt 189.0 lb

## 2019-05-22 DIAGNOSIS — Z0000 Encounter for general adult medical examination without abnormal findings: Secondary | ICD-10-CM

## 2019-05-22 NOTE — Patient Instructions (Addendum)
If you cannot attend class in person, you can still exercise at home. Video taped versions of [facility] classes are shown on [employer] (GTN) at 8 am and 1 pm Mondays through Fridays. You can also purchase a copy of the [facility] DVD by calling [place] (GTN) [employer]. GTN is available on [employer] channel 13 with a digital cable box and on [place] channel 31. GTN is also available on [employer], channel 99. To view GTN, go to channel 99, press OK, select [place], then select GTN to start the channel.  Continue doing brain stimulating activities (puzzles, reading, adult coloring books, staying active) to keep memory sharp.   Continue to eat heart healthy diet (full of fruits, vegetables, whole grains, lean protein, water--limit salt, fat, and sugar intake) and increase physical activity as tolerated.   [name] , Thank you for taking time to come for your Medicare Wellness Visit. I appreciate your ongoing commitment to your health goals. Please review the following plan we discussed and let me know if I can assist you in the future.   These are the goals we discussed: Goals    . Patient Stated     I want to continue to eat healthy and exercise. Enjoy life, family and worship God.    . Patient Stated     I want to eat healthier by eating more vegetables and drink more water.       This is a list of the screening recommended for you and due dates:  Health Maintenance  Topic Date Due  . Flu Shot  06/30/2019  . Colon Cancer Screening  01/04/2020  . Tetanus Vaccine  06/23/2020  . Mammogram  08/22/2020  . DEXA scan (bone density measurement)  Completed  .  Hepatitis C: One time screening is recommended by Center for Disease Control  (CDC) for  adults born from [birth year] through 1965.   Completed  . Pneumonia vaccines  Completed      Preventive Care 46 Years and Older, Female Preventive care refers to lifestyle choices and visits with  your health care provider that can promote health and wellness. What does preventive care include?  A yearly physical exam. This is also called an annual well check.  Dental exams once or twice a year.  Routine eye exams. Ask your health care provider how often you should have your eyes checked.  Personal lifestyle choices, including: ? Daily care of your teeth and gums. ? Regular physical activity. ? Eating a healthy diet. ? Avoiding tobacco and drug use. ? Limiting alcohol use. ? Practicing safe sex. ? Taking low-dose aspirin every day. ? Taking vitamin and mineral supplements as recommended by your health care provider. What happens during an annual well check? The services and screenings done by your health care provider during your annual well check will depend on your age, overall health, lifestyle risk factors, and family history of disease. Counseling Your health care provider may ask you questions about your:  Alcohol use.  Tobacco use.  Drug use.  Emotional well-being.  Home and relationship well-being.  Sexual activity.  Eating habits.  History of falls.  Memory and ability to understand (cognition).  Work and work Statistician.  Reproductive health.  Screening You may have the following tests or measurements:  Height, weight, and BMI.  Blood pressure.  Lipid and cholesterol levels. These may be checked every 5 years, or more frequently if you are over 41 years old.  Skin  check.  Lung cancer screening. You may have this screening every year starting at age 10 if you have a 30-pack-year history of smoking and currently smoke or have quit within the past 15 years.  Colorectal cancer screening. All adults should have this screening starting at age 48 and continuing until age 37. You will have tests every 1-10 years, depending on your results and the type of screening test. People at increased risk should start screening at an earlier age. Screening  tests may include: ? Guaiac-based fecal occult blood testing. ? Fecal immunochemical test (FIT). ? Stool DNA test. ? Virtual colonoscopy. ? Sigmoidoscopy. During this test, a flexible tube with a tiny camera (sigmoidoscope) is used to examine your rectum and lower colon. The sigmoidoscope is inserted through your anus into your rectum and lower colon. ? Colonoscopy. During this test, a long, thin, flexible tube with a tiny camera (colonoscope) is used to examine your entire colon and rectum.  Hepatitis C blood test.  Hepatitis B blood test.  Sexually transmitted disease (STD) testing.  Diabetes screening. This is done by checking your blood sugar (glucose) after you have not eaten for a while (fasting). You may have this done every 1-3 years.  Bone density scan. This is done to screen for osteoporosis. You may have this done starting at age 72.  Mammogram. This may be done every 1-2 years. Talk to your health care provider about how often you should have regular mammograms. Talk with your health care provider about your test results, treatment options, and if necessary, the need for more tests. Vaccines Your health care provider may recommend certain vaccines, such as:  Influenza vaccine. This is recommended every year.  Tetanus, diphtheria, and acellular pertussis (Tdap, Td) vaccine. You may need a Td booster every 10 years.  Varicella vaccine. You may need this if you have not been vaccinated.  Zoster vaccine. You may need this after age 75.  Measles, mumps, and rubella (MMR) vaccine. You may need at least one dose of MMR if you were born in 1957 or later. You may also need a second dose.  Pneumococcal 13-valent conjugate (PCV13) vaccine. One dose is recommended after age 3.  Pneumococcal polysaccharide (PPSV23) vaccine. One dose is recommended after age 67.  Meningococcal vaccine. You may need this if you have certain conditions.  Hepatitis A vaccine. You may need this if  you have certain conditions or if you travel or work in places where you may be exposed to hepatitis A.  Hepatitis B vaccine. You may need this if you have certain conditions or if you travel or work in places where you may be exposed to hepatitis B.  Haemophilus influenzae type b (Hib) vaccine. You may need this if you have certain conditions. Talk to your health care provider about which screenings and vaccines you need and how often you need them. This information is not intended to replace advice given to you by your health care provider. Make sure you discuss any questions you have with your health care provider. Document Released: 12/12/2015 Document Revised: 01/05/2018 Document Reviewed: 09/16/2015 Elsevier Interactive Patient Education  2019 [place] DASH stands for "Dietary Approaches to Stop Hypertension." The DASH eating plan is a healthy eating plan that has been shown to reduce high blood pressure (hypertension). It may also reduce your risk for type 2 diabetes, heart disease, and stroke. The DASH eating plan may also help with weight loss. What are tips for  following this plan?  General guidelines  Avoid eating more than 2,300 mg (milligrams) of salt (sodium) a day. If you have hypertension, you may need to reduce your sodium intake to 1,500 mg a day.  Limit alcohol intake to no more than 1 drink a day for nonpregnant women and 2 drinks a day for men. One drink equals 12 oz of beer, 5 oz of Kirin Pastorino, or 1 oz of hard liquor.  Work with your health care provider to maintain a healthy body weight or to lose weight. Ask what an ideal weight is for you.  Get at least 30 minutes of exercise that causes your heart to beat faster (aerobic exercise) most days of the week. Activities may include walking, swimming, or biking.  Work with your health care provider or diet and nutrition specialist (dietitian) to adjust your eating plan to your individual calorie  needs. Reading food labels   Check food labels for the amount of sodium per serving. Choose foods with less than 5 percent of the Daily Value of sodium. Generally, foods with less than 300 mg of sodium per serving fit into this eating plan.  To find whole grains, look for the word "whole" as the first word in the ingredient list. Shopping  Buy products labeled as "low-sodium" or "no salt added."  Buy fresh foods. Avoid canned foods and premade or frozen meals. Cooking  Avoid adding salt when cooking. Use salt-free seasonings or herbs instead of table salt or sea salt. Check with your health care provider or pharmacist before using salt substitutes.  Do not fry foods. Cook foods using healthy methods such as baking, boiling, grilling, and broiling instead.  Cook with heart-healthy oils, such as olive, canola, soybean, or sunflower oil. Meal planning  Eat a balanced diet that includes: ? 5 or more servings of fruits and vegetables each day. At each meal, try to fill half of your plate with fruits and vegetables. ? Up to 6-8 servings of whole grains each day. ? Less than 6 oz of lean meat, poultry, or fish each day. A 3-oz serving of meat is about the same size as a deck of cards. One egg equals 1 oz. ? 2 servings of low-fat dairy each day. ? A serving of nuts, seeds, or beans 5 times each week. ? Heart-healthy fats. Healthy fats called Omega-3 fatty acids are found in foods such as flaxseeds and coldwater fish, like sardines, salmon, and mackerel.  Limit how much you eat of the following: ? Canned or prepackaged foods. ? Food that is high in trans fat, such as fried foods. ? Food that is high in saturated fat, such as fatty meat. ? Sweets, desserts, sugary drinks, and other foods with added sugar. ? Full-fat dairy products.  Do not salt foods before eating.  Try to eat at least 2 vegetarian meals each week.  Eat more home-cooked food and less restaurant, buffet, and fast  food.  When eating at a restaurant, ask that your food be prepared with less salt or no salt, if possible. What foods are recommended? The items listed may not be a complete list. Talk with your dietitian about what dietary choices are best for you. Grains Whole-grain or whole-wheat bread. Whole-grain or whole-wheat pasta. Brown rice. [place]. Bulgur. Whole-grain and low-sodium cereals. Pita bread. Low-fat, low-sodium crackers. Whole-wheat flour tortillas. Vegetables Fresh or frozen vegetables (raw, steamed, roasted, or grilled). Low-sodium or reduced-sodium tomato and vegetable juice. Low-sodium or reduced-sodium tomato sauce and tomato  paste. Low-sodium or reduced-sodium canned vegetables. Fruits All fresh, dried, or frozen fruit. Canned fruit in natural juice (without added sugar). Meat and other protein foods Skinless chicken or [place]. Ground chicken or [place]. Pork with fat trimmed off. Fish and seafood. Egg whites. Dried beans, peas, or lentils. Unsalted nuts, nut butters, and seeds. Unsalted canned beans. Lean cuts of beef with fat trimmed off. Low-sodium, lean deli meat. Dairy Low-fat (1%) or fat-free (skim) milk. Fat-free, low-fat, or reduced-fat cheeses. Nonfat, low-sodium ricotta or cottage cheese. Low-fat or nonfat yogurt. Low-fat, low-sodium cheese. Fats and oils Soft margarine without trans fats. Vegetable oil. Low-fat, reduced-fat, or light mayonnaise and salad dressings (reduced-sodium). Canola, safflower, olive, soybean, and sunflower oils. Avocado. Seasoning and other foods Herbs. Spices. Seasoning mixes without salt. Unsalted popcorn and pretzels. Fat-free sweets. What foods are not recommended? The items listed may not be a complete list. Talk with your dietitian about what dietary choices are best for you. Grains Baked goods made with fat, such as croissants, muffins, or some breads. Dry pasta or rice meal packs. Vegetables Creamed or fried vegetables. Vegetables  in a cheese sauce. Regular canned vegetables (not low-sodium or reduced-sodium). Regular canned tomato sauce and paste (not low-sodium or reduced-sodium). Regular tomato and vegetable juice (not low-sodium or reduced-sodium). Angie Fava. Olives. Fruits Canned fruit in a light or heavy syrup. Fried fruit. Fruit in cream or butter sauce. Meat and other protein foods Fatty cuts of meat. Ribs. Fried meat. Berniece Salines. Sausage. Bologna and other processed lunch meats. Salami. Fatback. Hotdogs. Bratwurst. Salted nuts and seeds. Canned beans with added salt. Canned or smoked fish. Whole eggs or egg yolks. Chicken or [place] with skin. Dairy Whole or 2% milk, cream, and half-and-half. Whole or full-fat cream cheese. Whole-fat or sweetened yogurt. Full-fat cheese. Nondairy creamers. Whipped toppings. Processed cheese and cheese spreads. Fats and oils Butter. Stick margarine. Lard. Shortening. Ghee. Bacon fat. Tropical oils, such as coconut, palm kernel, or palm oil. Seasoning and other foods Salted popcorn and pretzels. Onion salt, garlic salt, seasoned salt, table salt, and sea salt. Worcestershire sauce. Tartar sauce. Barbecue sauce. Teriyaki sauce. Soy sauce, including reduced-sodium. Steak sauce. Canned and packaged gravies. Fish sauce. Oyster sauce. Cocktail sauce. Horseradish that you find on the shelf. Ketchup. Mustard. Meat flavorings and tenderizers. Bouillon cubes. Hot sauce and Tabasco sauce. Premade or packaged marinades. Premade or packaged taco seasonings. Relishes. Regular salad dressings. Where to find more information:  National Heart, Lung, and [place]: https://wilson-eaton.com/  American Heart Association: www.heart.org Summary  The DASH eating plan is a healthy eating plan that has been shown to reduce high blood pressure (hypertension). It may also reduce your risk for type 2 diabetes, heart disease, and stroke.  With the DASH eating plan, you should limit salt (sodium) intake to 2,300 mg a  day. If you have hypertension, you may need to reduce your sodium intake to 1,500 mg a day.  When on the DASH eating plan, aim to eat more fresh fruits and vegetables, whole grains, lean proteins, low-fat dairy, and heart-healthy fats.  Work with your health care provider or diet and nutrition specialist (dietitian) to adjust your eating plan to your individual calorie needs. This information is not intended to replace advice given to you by your health care provider. Make sure you discuss any questions you have with your health care provider. Document Released: 11/04/2011 Document Revised: 11/08/2016 Document Reviewed: 11/08/2016 Elsevier Interactive Patient Education  2019 [place] and Cholesterol Restricted Eating Plan Getting  too much fat and cholesterol in your diet may cause health problems. Choosing the right foods helps keep your fat and cholesterol at normal levels. This can keep you from getting certain diseases. Your doctor may recommend an eating plan that includes:  Total fat: ______% or less of total calories a day.  Saturated fat: ______% or less of total calories a day.  Cholesterol: less than _________mg a day.  Fiber: ______g a day. What are tips for following this plan? Meal planning  At meals, divide your plate into four equal parts: ? Fill one-half of your plate with vegetables and green salads. ? Fill one-fourth of your plate with whole grains. ? Fill one-fourth of your plate with low-fat (lean) protein foods.  Eat fish that is high in omega-3 fats at least two times a week. This includes mackerel, tuna, sardines, and salmon.  Eat foods that are high in fiber, such as whole grains, beans, apples, broccoli, carrots, peas, and barley. General tips   Work with your doctor to lose weight if you need to.  Avoid: ? Foods with added sugar. ? Fried foods. ? Foods with partially hydrogenated oils.  Limit alcohol intake to no more than 1 drink a day for  nonpregnant women and 2 drinks a day for men. One drink equals 12 oz of beer, 5 oz of Ki Corbo, or 1 oz of hard liquor. Reading food labels  Check food labels for: ? Trans fats. ? Partially hydrogenated oils. ? Saturated fat (g) in each serving. ? Cholesterol (mg) in each serving. ? Fiber (g) in each serving.  Choose foods with healthy fats, such as: ? Monounsaturated fats. ? Polyunsaturated fats. ? Omega-3 fats.  Choose grain products that have whole grains. Look for the word "whole" as the first word in the ingredient list. Cooking  Cook foods using low-fat methods. These include baking, boiling, grilling, and broiling.  Eat more home-cooked foods. Eat at restaurants and buffets less often.  Avoid cooking using saturated fats, such as butter, cream, palm oil, palm kernel oil, and coconut oil. Recommended foods  Fruits  All fresh, canned (in natural juice), or frozen fruits. Vegetables  Fresh or frozen vegetables (raw, steamed, roasted, or grilled). Green salads. Grains  Whole grains, such as whole wheat or whole grain breads, crackers, cereals, and pasta. Unsweetened oatmeal, bulgur, barley, quinoa, or brown rice. Corn or whole wheat flour tortillas. Meats and other protein foods  Ground beef (85% or leaner), grass-fed beef, or beef trimmed of fat. Skinless chicken or [place]. Ground chicken or [place]. Pork trimmed of fat. All fish and seafood. Egg whites. Dried beans, peas, or lentils. Unsalted nuts or seeds. Unsalted canned beans. Nut butters without added sugar or oil. Dairy  Low-fat or nonfat dairy products, such as skim or 1% milk, 2% or reduced-fat cheeses, low-fat and fat-free ricotta or cottage cheese, or plain low-fat and nonfat yogurt. Fats and oils  Tub margarine without trans fats. Light or reduced-fat mayonnaise and salad dressings. Avocado. Olive, canola, sesame, or safflower oils. The items listed above may not be a complete list of foods and beverages you can  eat. Contact a dietitian for more information. Foods to avoid Fruits  Canned fruit in heavy syrup. Fruit in cream or butter sauce. Fried fruit. Vegetables  Vegetables cooked in cheese, cream, or butter sauce. Fried vegetables. Grains  White bread. White pasta. White rice. Cornbread. Bagels, pastries, and croissants. Crackers and snack foods that contain trans fat and hydrogenated oils. Meats and other  protein foods  Fatty cuts of meat. Ribs, chicken wings, bacon, sausage, bologna, salami, chitterlings, fatback, hot dogs, bratwurst, and packaged lunch meats. Liver and organ meats. Whole eggs and egg yolks. Chicken and [place] with skin. Fried meat. Dairy  Whole or 2% milk, cream, half-and-half, and cream cheese. Whole milk cheeses. Whole-fat or sweetened yogurt. Full-fat cheeses. Nondairy creamers and whipped toppings. Processed cheese, cheese spreads, and cheese curds. Beverages  Alcohol. Sugar-sweetened drinks such as sodas, lemonade, and fruit drinks. Fats and oils  Butter, stick margarine, lard, shortening, ghee, or bacon fat. Coconut, palm kernel, and palm oils. Sweets and desserts  Corn syrup, sugars, honey, and molasses. Candy. Jam and jelly. Syrup. Sweetened cereals. Cookies, pies, cakes, donuts, muffins, and ice cream. The items listed above may not be a complete list of foods and beverages you should avoid. Contact a dietitian for more information. Summary  Choosing the right foods helps keep your fat and cholesterol at normal levels. This can keep you from getting certain diseases.  At meals, fill one-half of your plate with vegetables and green salads.  Eat high-fiber foods, like whole grains, beans, apples, carrots, peas, and barley.  Limit added sugar, saturated fats, alcohol, and fried foods. This information is not intended to replace advice given to you by your health care provider. Make sure you discuss any questions you have with your health care  provider. Document Released: 05/16/2012 Document Revised: 07/19/2018 Document Reviewed: 08/02/2017 Elsevier Interactive Patient Education  2019 [employer].

## 2019-06-08 DIAGNOSIS — H40003 Preglaucoma, unspecified, bilateral: Secondary | ICD-10-CM | POA: Diagnosis not present

## 2019-06-19 IMAGING — MR MR LUMBAR SPINE W/O CM
4 of 5 series · 26 of 48 positions shown · non-contrast
Comparison: MRI 09/16/2014.  Radiographs 08/29/2017

CLINICAL DATA: Low back pain with sciatica

EXAM:
MRI LUMBAR SPINE WITHOUT CONTRAST
TECHNIQUE: Multiplanar, multisequence MR imaging of the lumbar spine was
performed. No intravenous contrast was administered.

[Series 4: T1 · sagittal · 4.0mm · 0.55mm/px · 5 of 13 slices shown (1 of 2)]
[im 1/13]
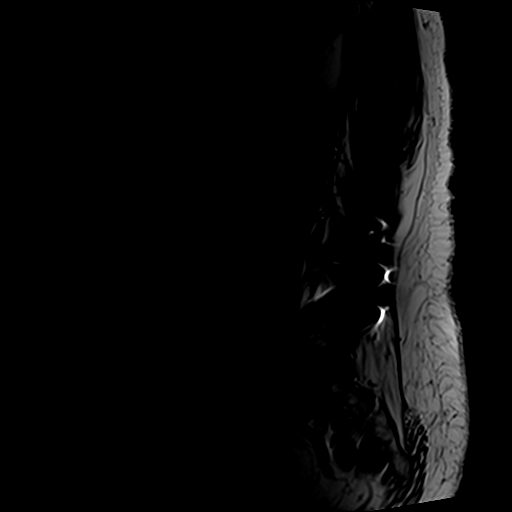
[im 4/13]
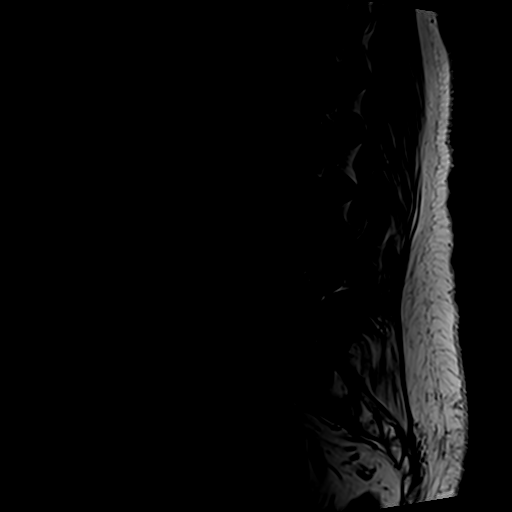
[im 7/13]
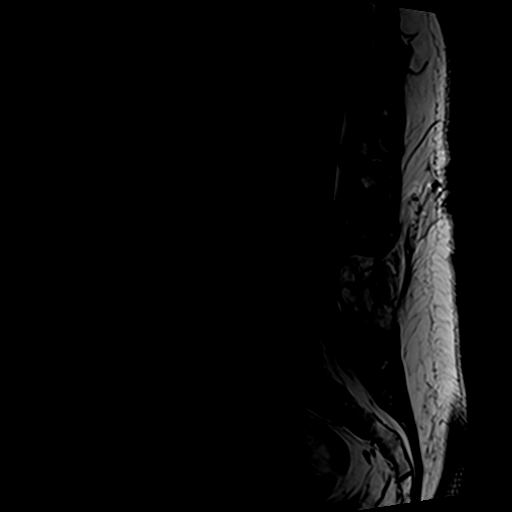
[im 10/13]
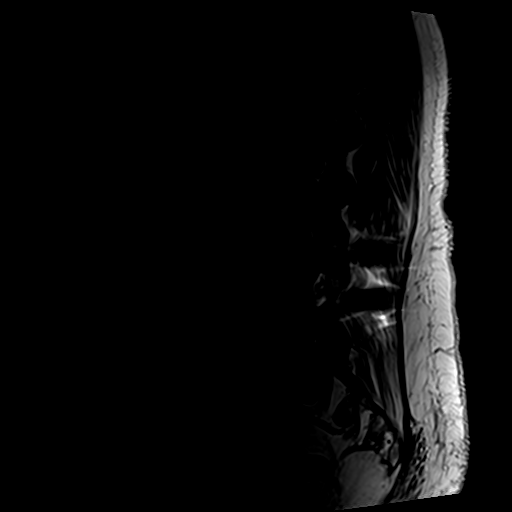
[im 13/13]
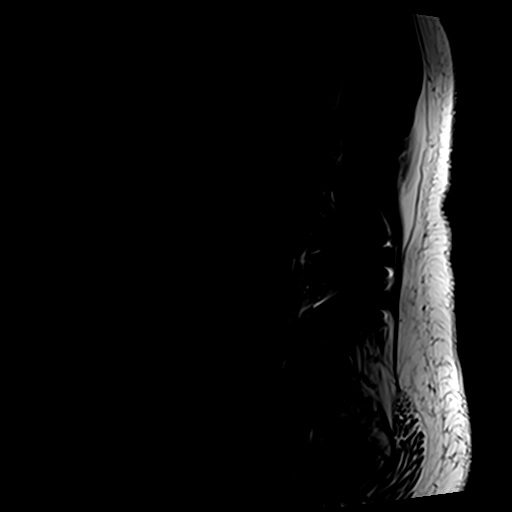

[Series 5: T2 post-contrast · sagittal · 4.0mm · 0.55mm/px · 5 of 13 slices shown]
[im 1/13]
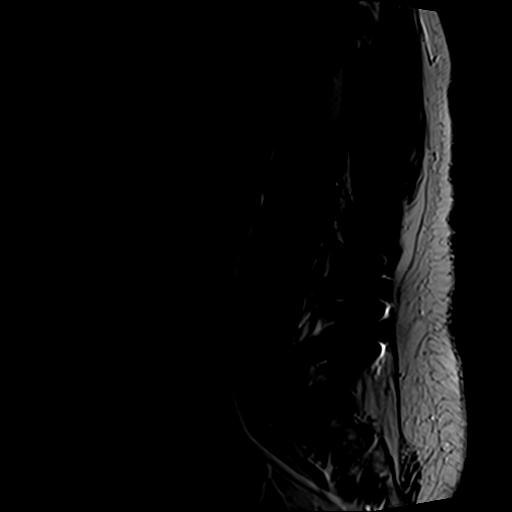
[im 4/13]
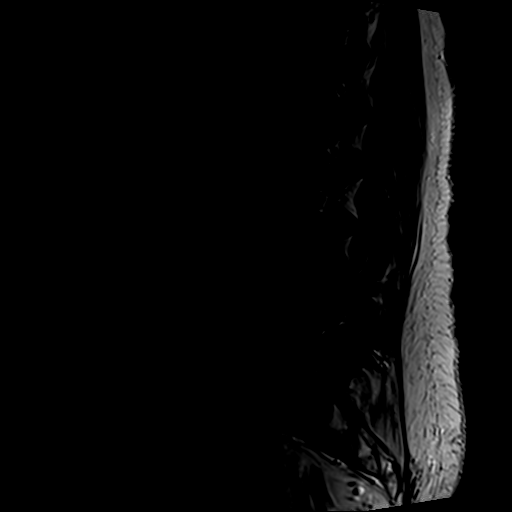
[im 7/13]
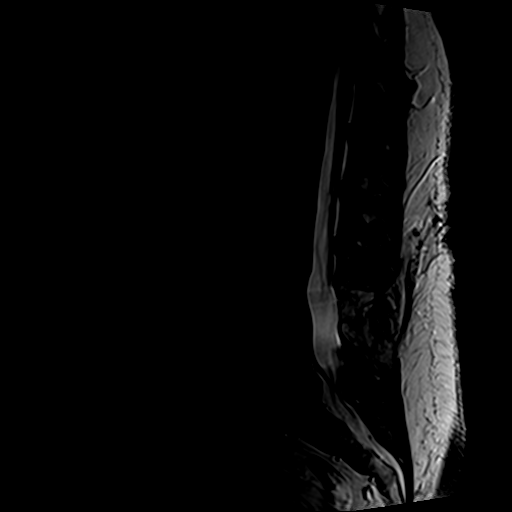
[im 10/13]
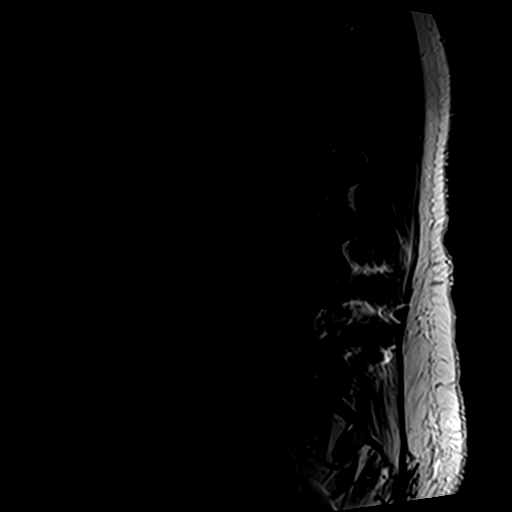
[im 13/13]
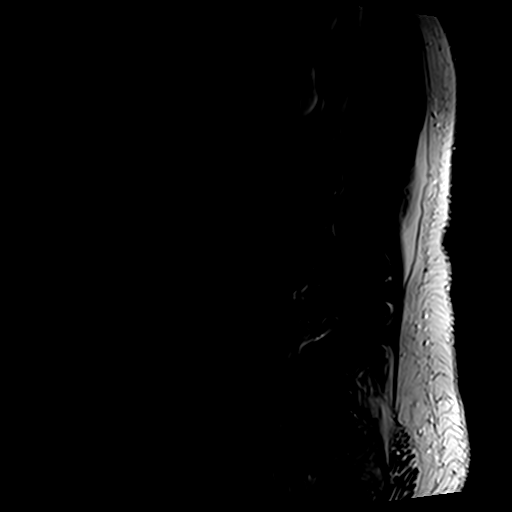

[Series 6: T2 · axial · 4.0mm · 0.70mm/px · z∈[-8,+193]mm · 10 of 39 slices shown]
[im 3/39]
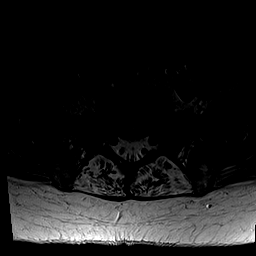
[im 6/39]
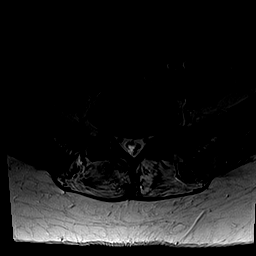
[im 8/39]
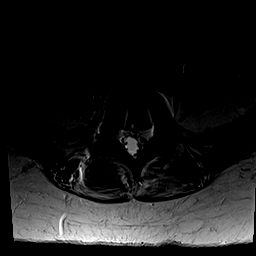
[im 13/39]
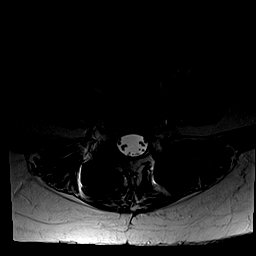
[im 18/39]
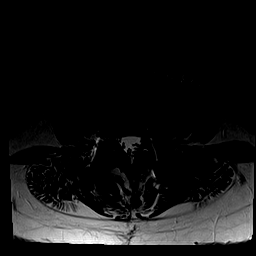
[im 21/39]
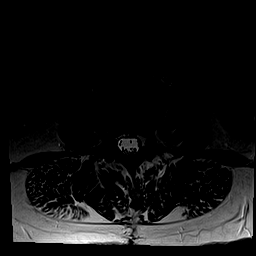
[im 23/39]
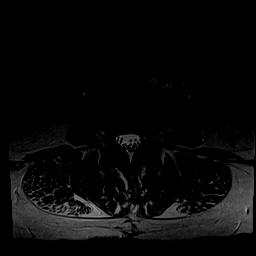
[im 28/39]
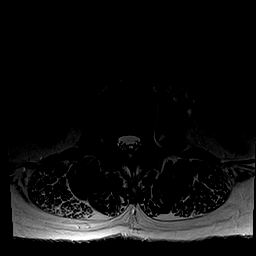
[im 33/39]
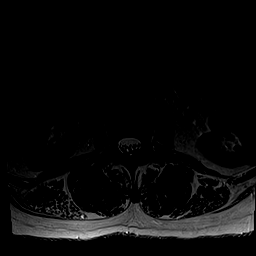
[im 39/39]
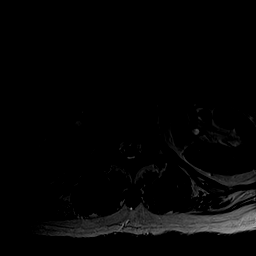

[Series 7: T1 · axial · 4.0mm · 0.35mm/px · z∈[-8,+163]mm · 6 of 39 slices shown (2 of 2)]
[im 3/39]
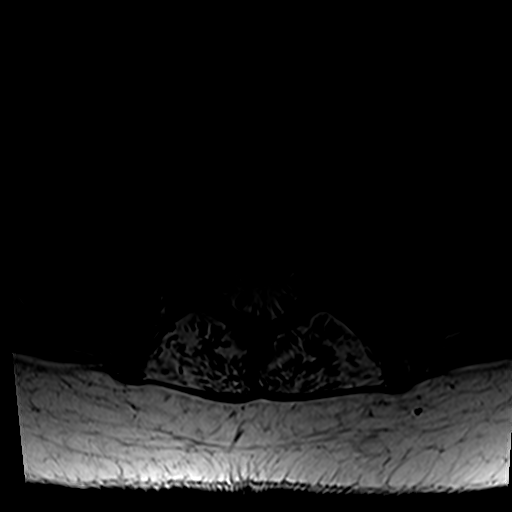
[im 6/39]
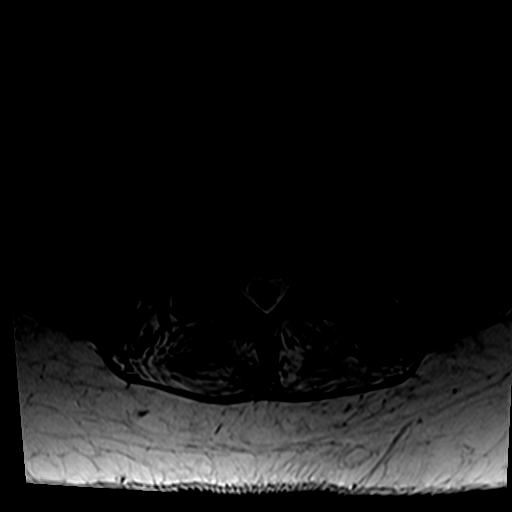
[im 8/39]
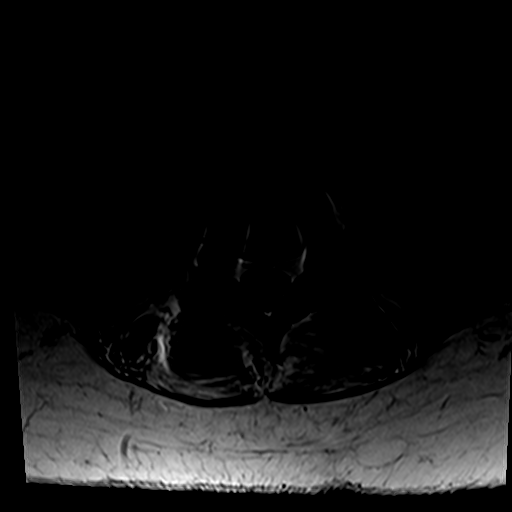
[im 13/39]
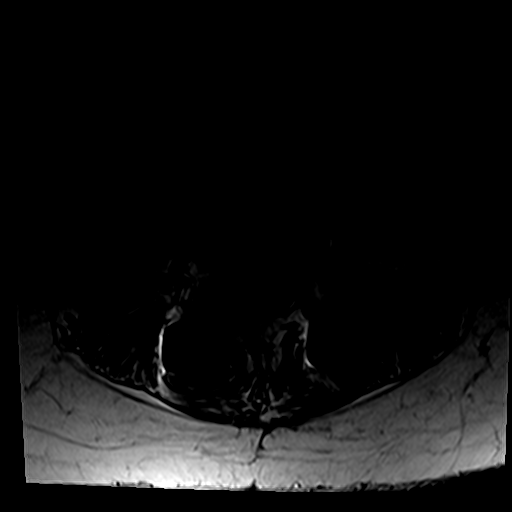
[im 21/39]
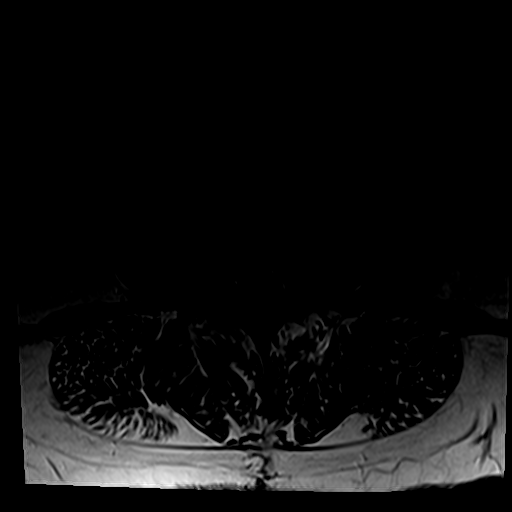
[im 33/39]
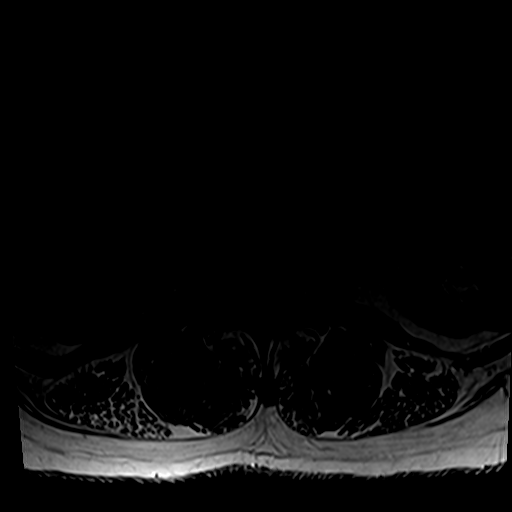

[26 of 48 positions shown; findings below may reference images not displayed]

FINDINGS: Segmentation:  Normal

Alignment: Mild retrolisthesis C3-4 unchanged. Remaining alignment
normal

Vertebrae:  Negative for fracture or mass.

Conus medullaris: Extends to the L1-2 level and appears normal.

Paraspinal and other soft tissues: Negative

Disc levels:

L1-2:  Negative

L2-3:  Negative

L3-4: Mild retrolisthesis. Mild disc and facet degeneration without
significant stenosis. No change from the prior MRI

L4-5: Pedicle screw and interbody fusion. Solid interbody fusion.
Posterior decompression. Negative for stenosis. No interval change.

L5-S1: Mild disc bulging and early facet degeneration without spinal
or foraminal stenosis. No significant change.
IMPRESSION: Solid fusion L4-5 without stenosis.

Mild disc and facet degeneration L3-4 and L5-S1 unchanged from 1762

## 2019-07-06 ENCOUNTER — Other Ambulatory Visit: Payer: Self-pay | Admitting: Internal Medicine

## 2019-07-06 DIAGNOSIS — K5904 Chronic idiopathic constipation: Secondary | ICD-10-CM

## 2019-07-11 ENCOUNTER — Other Ambulatory Visit: Payer: Self-pay | Admitting: Cardiovascular Disease

## 2019-07-16 ENCOUNTER — Other Ambulatory Visit: Payer: Self-pay | Admitting: Internal Medicine

## 2019-07-16 DIAGNOSIS — Z1231 Encounter for screening mammogram for malignant neoplasm of breast: Secondary | ICD-10-CM

## 2019-08-04 ENCOUNTER — Emergency Department (HOSPITAL_COMMUNITY): Payer: Medicare Other

## 2019-08-04 ENCOUNTER — Encounter (HOSPITAL_COMMUNITY): Payer: Self-pay | Admitting: *Deleted

## 2019-08-04 ENCOUNTER — Other Ambulatory Visit: Payer: Self-pay

## 2019-08-04 ENCOUNTER — Emergency Department (HOSPITAL_COMMUNITY)
Admission: EM | Admit: 2019-08-04 | Discharge: 2019-08-04 | Disposition: A | Discharge status: Home / Self Care | Payer: Medicare Other | Attending: Emergency Medicine | Admitting: Emergency Medicine

## 2019-08-04 DIAGNOSIS — R519 Headache, unspecified: Secondary | ICD-10-CM

## 2019-08-04 DIAGNOSIS — Z79899 Other long term (current) drug therapy: Secondary | ICD-10-CM | POA: Insufficient documentation

## 2019-08-04 DIAGNOSIS — Z7982 Long term (current) use of aspirin: Secondary | ICD-10-CM | POA: Diagnosis not present

## 2019-08-04 DIAGNOSIS — R42 Dizziness and giddiness: Secondary | ICD-10-CM | POA: Insufficient documentation

## 2019-08-04 DIAGNOSIS — I10 Essential (primary) hypertension: Secondary | ICD-10-CM | POA: Insufficient documentation

## 2019-08-04 DIAGNOSIS — R51 Headache: Secondary | ICD-10-CM | POA: Diagnosis not present

## 2019-08-04 LAB — CBC
HCT: 37.7 % (ref 36.0–46.0)
Hemoglobin: 12.7 g/dL (ref 12.0–15.0)
MCH: 27.3 pg (ref 26.0–34.0)
MCHC: 33.7 g/dL (ref 30.0–36.0)
MCV: 81.1 fL (ref 80.0–100.0)
Platelets: 277 10*3/uL (ref 150–400)
RBC: 4.65 MIL/uL (ref 3.87–5.11)
RDW: 13.9 % (ref 11.5–15.5)
WBC: 6.6 10*3/uL (ref 4.0–10.5)
nRBC: 0 % (ref 0.0–0.2)

## 2019-08-04 LAB — BASIC METABOLIC PANEL
Anion gap: 11 (ref 5–15)
BUN: 6 mg/dL — ABNORMAL LOW (ref 8–23)
CO2: 26 mmol/L (ref 22–32)
Calcium: 8.8 mg/dL — ABNORMAL LOW (ref 8.9–10.3)
Chloride: 105 mmol/L (ref 98–111)
Creatinine, Ser: 0.8 mg/dL (ref 0.44–1.00)
GFR calc Af Amer: >60 mL/min (ref >60)
GFR calc non Af Amer: >60 mL/min (ref >60)
Glucose, Bld: 104 mg/dL — ABNORMAL HIGH (ref 70–99)
Potassium: 3.4 mmol/L — ABNORMAL LOW (ref 3.5–5.1)
Sodium: 142 mmol/L (ref 135–145)

## 2019-08-04 LAB — TROPONIN I (HIGH SENSITIVITY)
Troponin I (High Sensitivity): <2 ng/L (ref <18)
Troponin I (High Sensitivity): 2 ng/L (ref <18)

## 2019-08-04 MED ORDER — DIPHENHYDRAMINE HCL 50 MG/ML IJ SOLN
12.5000 mg | Freq: Once | INTRAMUSCULAR | Status: AC
  Administered 2019-08-04: 12.5 mg via INTRAVENOUS
  Filled 2019-08-04: qty 1

## 2019-08-04 MED ORDER — MECLIZINE HCL 25 MG PO TABS
25.0000 mg | ORAL_TABLET | Freq: Once | ORAL | Status: AC
  Administered 2019-08-04: 25 mg via ORAL
  Filled 2019-08-04: qty 1

## 2019-08-04 MED ORDER — SODIUM CHLORIDE 0.9 % IV BOLUS
1000.0000 mL | Freq: Once | INTRAVENOUS | Status: AC
  Administered 2019-08-04: 1000 mL via INTRAVENOUS

## 2019-08-04 MED ORDER — MECLIZINE HCL 25 MG PO TABS: 25.0000 mg | ORAL_TABLET | Freq: Two times a day (BID) | ORAL | 0 refills | Status: DC | PRN

## 2019-08-04 MED ORDER — METOCLOPRAMIDE HCL 5 MG/ML IJ SOLN
10.0000 mg | Freq: Once | INTRAMUSCULAR | Status: AC
  Administered 2019-08-04: 10 mg via INTRAVENOUS
  Filled 2019-08-04: qty 2

## 2019-08-04 NOTE — ED Notes (Signed)
RN offered to take patient out to the front in a wheelchair. Patient declined.

## 2019-08-04 NOTE — ED Provider Notes (Signed)
[facility] DEPT Provider Note   CSN: [id number] Arrival date & time: 08/04/19  1336     History   Chief Complaint Chief Complaint  Patient presents with  . Fatigue  . Dizziness    HPI [name] is a 68 y.o. female history of hypertension, hyperlipidemia, here presenting with dizziness.  Patient states that she is dizzy and lightheaded since this morning .  She states that it is worse when she leans her head forward and is associated with some headaches as well.  She has some nausea and vomiting as well but denies any photophobia or phonophobia.  Denies any neck pain or stiffness or fevers.  Denies falling or trouble speaking.     The history is provided by the patient.    Past Medical History:  Diagnosis Date  . Anemia   . Arthritis   . Cholelithiasis   . Chronic back pain   . Constipation   . Esophageal reflux   . Fibromyalgia   . Hyperlipidemia   . Hypertension   . Hypothyroid   . IBS (irritable bowel syndrome)   . Internal hemorrhoids without mention of complication   . Left leg pain   . Personal history of colonic polyps 05/06/2010   ADENOMATOUS POLYP    Patient Active Problem List   Diagnosis Date Noted  . Takotsubo cardiomyopathy 02/26/2019  . NASH (nonalcoholic steatohepatitis) 01/22/2019  . Chronic idiopathic constipation 01/12/2019  . Arthritis of carpometacarpal [facility]) joint of left thumb 09/25/2018  . Degenerative arthritis of left knee 05/24/2018  . CAD (coronary artery disease) 05/16/2018  . Vitamin D deficiency 11/11/2017  . Greater trochanteric bursitis of left hip 08/29/2017  . Degenerative disc disease, lumbar 08/29/2017  . Prediabetes 06/14/2016  . Obesity (BMI 30.0-34.9) 06/14/2016  . Routine general medical examination at a health care facility 12/31/2014  . Osteopenia 07/20/2013  . GERD (gastroesophageal reflux disease) 09/16/2011  . Generalized anxiety disorder 09/16/2011  . Constipation, slow transit  09/16/2011  . DJD (degenerative joint disease) of knee 06/25/2011  . Pure hyperglyceridemia 06/24/2011  . Hyperlipidemia LDL goal <70 07/30/2009  . Hypothyroidism 04/25/2009  . Essential hypertension 04/25/2009  . FIBROMYALGIA 04/25/2009    Past Surgical History:  Procedure Laterality Date  . ABDOMINAL HYSTERECTOMY    . CERVICAL DISC SURGERY     PLATE IN NECK & BACK  . CHOLECYSTECTOMY    . COLONOSCOPY  09/27/2011   NORMAL   . LEFT HEART CATH AND CORONARY ANGIOGRAPHY N/A 05/08/2018   Procedure: LEFT HEART CATH AND CORONARY ANGIOGRAPHY;  Surgeon: Belva Crome, MD;  Location: [facility];  Service: Cardiovascular;  Laterality: N/A;  . LUMBAR [place]    . TONSILLECTOMY       OB History    Gravida  3   Para  2   Term      Preterm      AB  1   Living  2     SAB  1   TAB      Ectopic      Multiple      Live Births               Home Medications    Prior to Admission medications   Medication Sig Start Date End Date Taking? Authorizing Provider  clopidogrel (PLAVIX) 75 MG tablet Take 1 tablet (75 mg total) by mouth daily with breakfast. 04/24/19   [name], MD  fluticasone [facility]) 50 MCG/ACT  nasal spray Place 1 spray into both nostrils daily. 05/12/18   Duke, Tami Lin, PA  isosorbide mononitrate (IMDUR) 30 MG 24 hr tablet Take 1 tablet (30 mg total) by mouth daily. 04/24/19   [name], MD  levothyroxine (SYNTHROID) 88 MCG tablet TAKE 1 TABLET BY MOUTH EVERY DAY Patient taking differently: Take 88 mcg by mouth daily before breakfast.  04/10/19   [name], MD  linaclotide [facility]) 145 MCG CAPS capsule Take 1 capsule (145 mcg total) by mouth daily before breakfast. 03/20/19   [name], MD  losartan (COZAAR) 25 MG tablet Take 1 tablet (25 mg total) by mouth daily. 04/24/19   [name], MD  magnesium oxide (MAG-OX) 400 MG tablet Take 400 mg by mouth daily.    [provider]  metoprolol succinate (TOPROL-XL)  25 MG 24 hr tablet Take 1 tablet (25 mg total) by mouth daily. 04/24/19   [name], MD  nitroGLYCERIN (NITROSTAT) 0.4 MG SL tablet Place 1 tablet (0.4 mg total) under the tongue every 5 (five) minutes as needed for chest pain. 05/11/18   Duke, Tami Lin, PA  rosuvastatin (CRESTOR) 10 MG tablet TAKE 1 TABLET BY MOUTH EVERY DAY Patient taking differently: Take 10 mg by mouth daily.  07/12/19   [name], MD  Vitamin D, Ergocalciferol, (DRISDOL) 50000 units CAPS capsule TAKE 1 CAPSULE (50,000 UNITS TOTAL) BY MOUTH EVERY 7 (SEVEN) DAYS. 07/27/18   Lyndal Pulley, DO    Family History Family History  Problem Relation Age of Onset  . Diabetes Mother        aunts and uncles  . Kidney disease Mother        stage 3  . Lung disease Mother        colapsed lung/in hospice  . COPD Mother   . Hypertension Mother   . Dementia Mother   . Breast cancer Maternal Aunt   . Heart failure Father        Mother  . Breast cancer Cousin   . Colon cancer Neg Hx     Social History Social History   Tobacco Use  . Smoking status: Never Smoker  . Smokeless tobacco: Never Used  Substance Use Topics  . Alcohol use: No    Alcohol/week: 0.0 standard drinks  . Drug use: No     Allergies   Aspirin, Gadolinium, Iohexol, Livalo [pitavastatin], Hydrocodone-acetaminophen, and Lipitor [atorvastatin]   Review of Systems Review of Systems  Neurological: Positive for dizziness.  All other systems reviewed and are negative.    Physical Exam Updated Vital Signs BP (!) 178/82 (BP Location: Right Arm)   Pulse 62   Temp 98.2 F (36.8 C) (Oral)   Resp 16   SpO2 100%   Physical Exam Vitals signs and nursing note reviewed.  HENT:     Head: Normocephalic.     Right Ear: Tympanic membrane normal.     Nose: Nose normal.     Mouth/Throat:     Mouth: Mucous membranes are moist.  Eyes:     Comments: ? Mild nystagmus to the left, no vertical nystagmus   Neck:     Musculoskeletal: Normal  range of motion.  Cardiovascular:     Rate and Rhythm: Normal rate and regular rhythm.     Pulses: Normal pulses.  Pulmonary:     Effort: Pulmonary effort is normal.  Abdominal:     General: Abdomen is flat.  Musculoskeletal: Normal range of motion.  Skin:  General: Skin is warm.     Capillary Refill: Capillary refill takes less than 2 seconds.  Neurological:     General: No focal deficit present.     Mental Status: She is alert and oriented to person, place, and time.     Comments: CN 2- 12 intact, nl strength bilaterally, nl finger to nose, nl gait   Psychiatric:        Mood and Affect: Mood normal.        Behavior: Behavior normal.      ED Treatments / Results  Labs (all labs ordered are listed, but only abnormal results are displayed) Labs Reviewed  BASIC METABOLIC PANEL - Abnormal; Notable for the following components:      Result Value   Potassium 3.4 (*)    Glucose, Bld 104 (*)    BUN 6 (*)    Calcium 8.8 (*)    All other components within normal limits  CBC  TROPONIN I (HIGH SENSITIVITY)  TROPONIN I (HIGH SENSITIVITY)    EKG EKG Interpretation  Date/Time:  Saturday August 04 2019 13:45:26 EDT Ventricular Rate:  60 PR Interval:    QRS Duration: 96 QT Interval:  404 QTC Calculation: 404 R Axis:   23 Text Interpretation:  Sinus rhythm RSR' in V1 or V2, right VCD or RVH No significant change since last tracing Confirmed by Wandra Arthurs [phone]) on 08/04/2019 6:13:55 PM   Radiology No results found.  Procedures Procedures (including critical care time)  Medications Ordered in ED Medications  sodium chloride 0.9 % bolus 1,000 mL (1,000 mLs Intravenous Bolus from Bag 08/04/19 1835)  metoCLOPramide (REGLAN) injection 10 mg (10 mg Intravenous Given 08/04/19 1842)  diphenhydrAMINE (BENADRYL) injection 12.5 mg (12.5 mg Intravenous Given 08/04/19 1844)  meclizine (ANTIVERT) tablet 25 mg (25 mg Oral Given 08/04/19 1842)     Initial Impression / Assessment and  Plan / ED Course  I have reviewed the triage vital signs and the nursing notes.  Pertinent labs & imaging results that were available during my care of the patient were reviewed by me and considered in my medical decision making (see chart for details).       KEMBA HOPPES is a 68 y.o. female who presented with dizziness, headaches.  I think likely migraines or mild dehydration.  I have low suspicion for subarachnoid or posterior circulation stroke. Nl neuro exam currently. Will get labs, CT head. If CT head showed no bleed, will not need LP. Will give migraine cocktail   8:09 PM Labs unremarkable, CT head normal. Felt better after migraine cocktail. Stable for discharge    Final Clinical Impressions(s) / ED Diagnoses   Final diagnoses:  None    ED Discharge Orders    None       Drenda Freeze, MD 08/04/19 2009

## 2019-08-04 NOTE — Discharge Instructions (Addendum)
Stay hydrated   Take tylenol, motrin for headaches  Take meclizine for dizziness   See your doctor   Return to ER if you have worse headaches, dizziness, falling

## 2019-08-04 NOTE — ED Triage Notes (Signed)
Pt states she has felt dizzy and tired since yesterday. Pt denies pain, states "I just don't feel like moving".

## 2019-08-13 ENCOUNTER — Other Ambulatory Visit (INDEPENDENT_AMBULATORY_CARE_PROVIDER_SITE_OTHER): Payer: Medicare Other

## 2019-08-13 ENCOUNTER — Other Ambulatory Visit: Payer: Self-pay

## 2019-08-13 ENCOUNTER — Ambulatory Visit (INDEPENDENT_AMBULATORY_CARE_PROVIDER_SITE_OTHER): Payer: Medicare Other | Admitting: Internal Medicine

## 2019-08-13 ENCOUNTER — Encounter: Payer: Self-pay | Admitting: Internal Medicine

## 2019-08-13 VITALS — BP 184/96 | HR 62 | Temp 98.8°F | Resp 16 | Ht 67.0 in | Wt 185.0 lb

## 2019-08-13 DIAGNOSIS — K7581 Nonalcoholic steatohepatitis (NASH): Secondary | ICD-10-CM

## 2019-08-13 DIAGNOSIS — E039 Hypothyroidism, unspecified: Secondary | ICD-10-CM

## 2019-08-13 DIAGNOSIS — I10 Essential (primary) hypertension: Secondary | ICD-10-CM

## 2019-08-13 DIAGNOSIS — Z23 Encounter for immunization: Secondary | ICD-10-CM | POA: Diagnosis not present

## 2019-08-13 LAB — CBC WITH DIFFERENTIAL/PLATELET
Basophils Absolute: 0.1 10*3/uL (ref 0.0–0.1)
Basophils Relative: 1.2 % (ref 0.0–3.0)
Eosinophils Absolute: 0.1 10*3/uL (ref 0.0–0.7)
Eosinophils Relative: 1.4 % (ref 0.0–5.0)
HCT: 41.2 % (ref 36.0–46.0)
Hemoglobin: 13.7 g/dL (ref 12.0–15.0)
Lymphocytes Relative: 38.7 % (ref 12.0–46.0)
Lymphs Abs: 3.1 10*3/uL (ref 0.7–4.0)
MCHC: 33.1 g/dL (ref 30.0–36.0)
MCV: 81.4 fl (ref 78.0–100.0)
Monocytes Absolute: 0.6 10*3/uL (ref 0.1–1.0)
Monocytes Relative: 7.4 % (ref 3.0–12.0)
Neutro Abs: 4.1 10*3/uL (ref 1.4–7.7)
Neutrophils Relative %: 51.3 % (ref 43.0–77.0)
Platelets: 287 10*3/uL (ref 150.0–400.0)
RBC: 5.07 Mil/uL (ref 3.87–5.11)
RDW: 13.9 % (ref 11.5–15.5)
WBC: 8 10*3/uL (ref 4.0–10.5)

## 2019-08-13 LAB — BASIC METABOLIC PANEL
BUN: 5 mg/dL — ABNORMAL LOW (ref 6–23)
CO2: 28 mEq/L (ref 19–32)
Calcium: 9.9 mg/dL (ref 8.4–10.5)
Chloride: 103 mEq/L (ref 96–112)
Creatinine, Ser: 0.75 mg/dL (ref 0.40–1.20)
GFR: 92.92 mL/min (ref >60.00)
Glucose, Bld: 102 mg/dL — ABNORMAL HIGH (ref 70–99)
Potassium: 4 mEq/L (ref 3.5–5.1)
Sodium: 141 mEq/L (ref 135–145)

## 2019-08-13 LAB — HEPATIC FUNCTION PANEL
ALT: 20 U/L (ref 0–35)
AST: 27 U/L (ref 0–37)
Albumin: 4.2 g/dL (ref 3.5–5.2)
Alkaline Phosphatase: 87 U/L (ref 39–117)
Bilirubin, Direct: 0.2 mg/dL (ref 0.0–0.3)
Total Bilirubin: 1.1 mg/dL (ref 0.2–1.2)
Total Protein: 7.7 g/dL (ref 6.0–8.3)

## 2019-08-13 LAB — TSH: TSH: 2.8 u[IU]/mL (ref 0.35–4.50)

## 2019-08-13 LAB — CORTISOL: Cortisol, Plasma: 8.8 ug/dL

## 2019-08-13 MED ORDER — INDAPAMIDE 1.25 MG PO TABS: 1.2500 mg | ORAL_TABLET | Freq: Every day | ORAL | 0 refills | Status: DC

## 2019-08-13 NOTE — Progress Notes (Signed)
Subjective:  Patient ID: [name], female    DOB: 1951-07-09  Age: 68 y.o. MRN: [id number]  CC: Hypertension   HPI [name] presents for f/up -she was recently seen in the ED for elevated blood pressure with headache and dizziness.  She thinks she was dehydrated at the time.  She tells me the headache persists but her blood pressure is not well controlled.  Outpatient Medications Prior to Visit  Medication Sig Dispense Refill  . clopidogrel (PLAVIX) 75 MG tablet Take 1 tablet (75 mg total) by mouth daily with breakfast. 90 tablet 3  . fluticasone (FLONASE) 50 MCG/ACT nasal spray Place 1 spray into both nostrils daily. (Patient taking differently: Place 1 spray into both nostrils daily as needed for allergies or rhinitis. ) 16 g 2  . isosorbide mononitrate (IMDUR) 30 MG 24 hr tablet Take 1 tablet (30 mg total) by mouth daily. 90 tablet 3  . levothyroxine (SYNTHROID) 88 MCG tablet TAKE 1 TABLET BY MOUTH EVERY DAY (Patient taking differently: Take 88 mcg by mouth daily before breakfast. ) 90 tablet 1  . linaclotide (LINZESS) 145 MCG CAPS capsule Take 1 capsule (145 mcg total) by mouth daily before breakfast. 90 capsule 1  . losartan (COZAAR) 25 MG tablet Take 1 tablet (25 mg total) by mouth daily. 90 tablet 3  . meclizine (ANTIVERT) 25 MG tablet Take 1 tablet (25 mg total) by mouth 2 (two) times daily as needed for dizziness. 10 tablet 0  . metoprolol succinate (TOPROL-XL) 25 MG 24 hr tablet Take 1 tablet (25 mg total) by mouth daily. 90 tablet 3  . nitroGLYCERIN (NITROSTAT) 0.4 MG SL tablet Place 1 tablet (0.4 mg total) under the tongue every 5 (five) minutes as needed for chest pain. 25 tablet 1  . rosuvastatin (CRESTOR) 10 MG tablet TAKE 1 TABLET BY MOUTH EVERY DAY (Patient taking differently: Take 10 mg by mouth daily. ) 90 tablet 2  . Vitamin D, Ergocalciferol, (DRISDOL) 50000 units CAPS capsule TAKE 1 CAPSULE (50,000 UNITS TOTAL) BY MOUTH EVERY 7 (SEVEN) DAYS. 12 capsule 0   No  facility-administered medications prior to visit.     ROS Review of Systems  Constitutional: Negative for diaphoresis and fatigue.  HENT: Negative.   Eyes: Negative for visual disturbance.  Respiratory: Negative for cough, chest tightness, shortness of breath and wheezing.   Cardiovascular: Negative for chest pain, palpitations and leg swelling.  Gastrointestinal: Negative for abdominal pain, constipation, diarrhea, nausea and vomiting.  Endocrine: Negative.   Genitourinary: Negative.  Negative for difficulty urinating, dysuria and hematuria.  Musculoskeletal: Negative for arthralgias and myalgias.  Skin: Negative.  Negative for color change.  Neurological: Positive for headaches. Negative for dizziness, weakness and light-headedness.  Hematological: Negative for adenopathy. Does not bruise/bleed easily.  Psychiatric/Behavioral: Negative.     Objective:  BP (!) 184/96 (BP Location: Left Arm, Patient Position: Sitting, Cuff Size: Normal)   Pulse 62   Temp 98.8 F (37.1 C) (Oral)   Resp 16   Ht 5' 7"  (1.702 m)   Wt 185 lb (83.9 kg)   SpO2 96%   BMI 28.98 kg/m   BP Readings from Last 3 Encounters:  08/13/19 (!) 184/96  08/04/19 (!) 152/82  05/22/19 (!) 141/72    Wt Readings from Last 3 Encounters:  08/13/19 185 lb (83.9 kg)  05/22/19 189 lb (85.7 kg)  05/21/19 189 lb 12 oz (86.1 kg)    Physical Exam Vitals signs reviewed.  Constitutional:  Appearance: Normal appearance. She is not ill-appearing or diaphoretic.  HENT:     Nose: Nose normal.     Mouth/Throat:     Mouth: Mucous membranes are moist.  Eyes:     General: No scleral icterus.    Conjunctiva/sclera: Conjunctivae normal.  Neck:     Musculoskeletal: Normal range of motion and neck supple.  Cardiovascular:     Rate and Rhythm: Normal rate and regular rhythm.     Heart sounds: No murmur.  Pulmonary:     Effort: Pulmonary effort is normal.     Breath sounds: No stridor. No wheezing, rhonchi or rales.   Abdominal:     General: Abdomen is flat.     Palpations: There is no hepatomegaly, splenomegaly or mass.     Tenderness: There is no abdominal tenderness. There is no guarding.  Musculoskeletal: Normal range of motion.     Right lower leg: No edema.     Left lower leg: No edema.  Skin:    General: Skin is warm and dry.  Neurological:     General: No focal deficit present.     Mental Status: She is alert.  Psychiatric:        Mood and Affect: Mood normal.        Behavior: Behavior normal.     Lab Results  Component Value Date   WBC 8.0 08/13/2019   HGB 13.7 08/13/2019   HCT 41.2 08/13/2019   PLT 287.0 08/13/2019   GLUCOSE 102 (H) 08/13/2019   CHOL 123 05/21/2019   TRIG 122.0 05/21/2019   HDL 43.90 05/21/2019   LDLDIRECT 88.0 10/19/2016   LDLCALC 55 05/21/2019   ALT 20 08/13/2019   AST 27 08/13/2019   NA 141 08/13/2019   K 4.0 08/13/2019   CL 103 08/13/2019   CREATININE 0.75 08/13/2019   BUN 5 (L) 08/13/2019   CO2 28 08/13/2019   TSH 2.80 08/13/2019   INR 1.01 05/08/2018   HGBA1C 6.1 05/21/2019    Ct Head Wo Contrast  Result Date: 08/04/2019 CLINICAL DATA:  Pt states she has felt dizzy and tired since yesterday. Pt denies pain, states "I just don't feel like moving". EXAM: CT HEAD WITHOUT CONTRAST TECHNIQUE: Contiguous axial images were obtained from the base of the skull through the vertex without intravenous contrast. COMPARISON:  CT head 11/14/2017 FINDINGS: Brain: No evidence of acute infarction, hemorrhage, hydrocephalus, extra-axial collection or mass lesion/mass effect. Vascular: No hyperdense vessel or unexpected calcification. Skull: Normal. Negative for fracture or focal lesion. Sinuses/Orbits: No acute finding. Other: None. IMPRESSION: No acute intracranial process. Electronically Signed   By: Audie Pinto M.D.   On: 08/04/2019 19:32    Assessment & Plan:   [name] was seen today for hypertension.  Diagnoses and all orders for this visit:  Essential  hypertension- Her blood pressure is not adequately well controlled and she is symptomatic.  Her labs are negative for secondary causes or endorgan damage.  I have asked her to add indapamide to her current antihypertensive regimen. -     Basic metabolic panel; Future -     CBC with Differential/Platelet; Future -     Urinalysis, Routine w reflex microscopic; Future -     Cortisol; Future -     indapamide (LOZOL) 1.25 MG tablet; Take 1 tablet (1.25 mg total) by mouth daily.  NASH (nonalcoholic steatohepatitis)- Her LFTs are normal now. -     Hepatic function panel; Future  Acquired hypothyroidism- Her TSH is in  the normal range.  She will remain on the current dose of levothyroxine. -     TSH; Future  Need for influenza vaccination -     Flu Vaccine QUAD High Dose(Fluad)   I am having Kerin Ransom. Krausz start on indapamide. I am also having her maintain her nitroGLYCERIN, fluticasone, Vitamin D (Ergocalciferol), linaclotide, levothyroxine, metoprolol succinate, clopidogrel, isosorbide mononitrate, losartan, rosuvastatin, and meclizine.  Meds ordered this encounter  Medications  . indapamide (LOZOL) 1.25 MG tablet    Sig: Take 1 tablet (1.25 mg total) by mouth daily.    Dispense:  90 tablet    Refill:  0     Follow-up: Return in about 3 weeks (around 09/03/2019).  [name], MD

## 2019-08-13 NOTE — Patient Instructions (Signed)

## 2019-08-17 ENCOUNTER — Encounter (HOSPITAL_COMMUNITY): Payer: Self-pay

## 2019-08-17 ENCOUNTER — Observation Stay (HOSPITAL_BASED_OUTPATIENT_CLINIC_OR_DEPARTMENT_OTHER): Payer: Medicare Other

## 2019-08-17 ENCOUNTER — Other Ambulatory Visit: Payer: Self-pay

## 2019-08-17 ENCOUNTER — Observation Stay (HOSPITAL_COMMUNITY)
Admission: EM | Admit: 2019-08-17 | Discharge: 2019-08-17 | Disposition: A | Discharge status: Home / Self Care | Payer: Medicare Other | Attending: Internal Medicine | Admitting: Internal Medicine

## 2019-08-17 ENCOUNTER — Emergency Department (HOSPITAL_COMMUNITY): Payer: Medicare Other

## 2019-08-17 DIAGNOSIS — I2510 Atherosclerotic heart disease of native coronary artery without angina pectoris: Secondary | ICD-10-CM | POA: Diagnosis present

## 2019-08-17 DIAGNOSIS — Z886 Allergy status to analgesic agent status: Secondary | ICD-10-CM | POA: Insufficient documentation

## 2019-08-17 DIAGNOSIS — M797 Fibromyalgia: Secondary | ICD-10-CM | POA: Diagnosis not present

## 2019-08-17 DIAGNOSIS — Z885 Allergy status to narcotic agent status: Secondary | ICD-10-CM | POA: Diagnosis not present

## 2019-08-17 DIAGNOSIS — I25118 Atherosclerotic heart disease of native coronary artery with other forms of angina pectoris: Secondary | ICD-10-CM | POA: Diagnosis not present

## 2019-08-17 DIAGNOSIS — R079 Chest pain, unspecified: Principal | ICD-10-CM | POA: Insufficient documentation

## 2019-08-17 DIAGNOSIS — Z7902 Long term (current) use of antithrombotics/antiplatelets: Secondary | ICD-10-CM | POA: Insufficient documentation

## 2019-08-17 DIAGNOSIS — Z79899 Other long term (current) drug therapy: Secondary | ICD-10-CM | POA: Diagnosis not present

## 2019-08-17 DIAGNOSIS — E785 Hyperlipidemia, unspecified: Secondary | ICD-10-CM | POA: Diagnosis not present

## 2019-08-17 DIAGNOSIS — I10 Essential (primary) hypertension: Secondary | ICD-10-CM

## 2019-08-17 DIAGNOSIS — R110 Nausea: Secondary | ICD-10-CM | POA: Diagnosis not present

## 2019-08-17 DIAGNOSIS — E669 Obesity, unspecified: Secondary | ICD-10-CM | POA: Diagnosis not present

## 2019-08-17 DIAGNOSIS — Z79890 Hormone replacement therapy (postmenopausal): Secondary | ICD-10-CM | POA: Insufficient documentation

## 2019-08-17 DIAGNOSIS — K589 Irritable bowel syndrome without diarrhea: Secondary | ICD-10-CM | POA: Insufficient documentation

## 2019-08-17 DIAGNOSIS — K219 Gastro-esophageal reflux disease without esophagitis: Secondary | ICD-10-CM | POA: Insufficient documentation

## 2019-08-17 DIAGNOSIS — Z20828 Contact with and (suspected) exposure to other viral communicable diseases: Secondary | ICD-10-CM | POA: Diagnosis not present

## 2019-08-17 DIAGNOSIS — E039 Hypothyroidism, unspecified: Secondary | ICD-10-CM | POA: Diagnosis not present

## 2019-08-17 LAB — LIPID PANEL
Cholesterol: 123 mg/dL (ref 0–200)
HDL: 47 mg/dL (ref >40)
LDL Cholesterol: 55 mg/dL (ref 0–99)
Total CHOL/HDL Ratio: 2.6 RATIO
Triglycerides: 103 mg/dL (ref <150)
VLDL: 21 mg/dL (ref 0–40)

## 2019-08-17 LAB — RAPID URINE DRUG SCREEN, HOSP PERFORMED
Amphetamines: NOT DETECTED
Barbiturates: NOT DETECTED
Benzodiazepines: NOT DETECTED
Cocaine: NOT DETECTED
Opiates: POSITIVE — AB
Tetrahydrocannabinol: NOT DETECTED

## 2019-08-17 LAB — CBC
HCT: 36.6 % (ref 36.0–46.0)
Hemoglobin: 13 g/dL (ref 12.0–15.0)
MCH: 28.1 pg (ref 26.0–34.0)
MCHC: 35.5 g/dL (ref 30.0–36.0)
MCV: 79 fL — ABNORMAL LOW (ref 80.0–100.0)
Platelets: 263 10*3/uL (ref 150–400)
RBC: 4.63 MIL/uL (ref 3.87–5.11)
RDW: 13.6 % (ref 11.5–15.5)
WBC: 8 10*3/uL (ref 4.0–10.5)
nRBC: 0 % (ref 0.0–0.2)

## 2019-08-17 LAB — TROPONIN I (HIGH SENSITIVITY)
Troponin I (High Sensitivity): 2 ng/L (ref <18)
Troponin I (High Sensitivity): 3 ng/L (ref <18)
Troponin I (High Sensitivity): 3 ng/L (ref <18)

## 2019-08-17 LAB — BASIC METABOLIC PANEL
Anion gap: 7 (ref 5–15)
BUN: 8 mg/dL (ref 8–23)
CO2: 25 mmol/L (ref 22–32)
Calcium: 9 mg/dL (ref 8.9–10.3)
Chloride: 107 mmol/L (ref 98–111)
Creatinine, Ser: 0.77 mg/dL (ref 0.44–1.00)
GFR calc Af Amer: >60 mL/min (ref >60)
GFR calc non Af Amer: >60 mL/min (ref >60)
Glucose, Bld: 128 mg/dL — ABNORMAL HIGH (ref 70–99)
Potassium: 3.8 mmol/L (ref 3.5–5.1)
Sodium: 139 mmol/L (ref 135–145)

## 2019-08-17 LAB — ECHOCARDIOGRAM COMPLETE
Height: 67 in
Weight: 2959.98 oz

## 2019-08-17 LAB — SARS CORONAVIRUS 2 (TAT 6-24 HRS): SARS Coronavirus 2: NEGATIVE

## 2019-08-17 LAB — HIV ANTIBODY (ROUTINE TESTING W REFLEX): HIV Screen 4th Generation wRfx: NONREACTIVE

## 2019-08-17 MED ORDER — AMLODIPINE BESYLATE 5 MG PO TABS
5.0000 mg | ORAL_TABLET | Freq: Every day | ORAL | Status: DC
  Administered 2019-08-17: 16:00:00 5 mg via ORAL
  Filled 2019-08-17: qty 1

## 2019-08-17 MED ORDER — INDAPAMIDE 1.25 MG PO TABS
1.2500 mg | ORAL_TABLET | Freq: Every day | ORAL | Status: DC
  Administered 2019-08-17: 1.25 mg via ORAL
  Filled 2019-08-17: qty 1

## 2019-08-17 MED ORDER — LOSARTAN POTASSIUM 25 MG PO TABS
25.0000 mg | ORAL_TABLET | Freq: Every day | ORAL | Status: DC
  Administered 2019-08-17: 25 mg via ORAL
  Filled 2019-08-17: qty 1

## 2019-08-17 MED ORDER — LINACLOTIDE 145 MCG PO CAPS
145.0000 ug | ORAL_CAPSULE | Freq: Every day | ORAL | Status: DC
  Administered 2019-08-17: 145 ug via ORAL
  Filled 2019-08-17 (×2): qty 1

## 2019-08-17 MED ORDER — AMLODIPINE BESYLATE 5 MG PO TABS: 5.0000 mg | ORAL_TABLET | Freq: Every day | ORAL | 0 refills | Status: DC

## 2019-08-17 MED ORDER — ONDANSETRON HCL 4 MG/2ML IJ SOLN
4.0000 mg | Freq: Once | INTRAMUSCULAR | Status: AC
  Administered 2019-08-17: 4 mg via INTRAVENOUS
  Filled 2019-08-17: qty 2

## 2019-08-17 MED ORDER — ROSUVASTATIN CALCIUM 5 MG PO TABS
10.0000 mg | ORAL_TABLET | Freq: Every day | ORAL | Status: DC
  Administered 2019-08-17: 10 mg via ORAL
  Filled 2019-08-17: qty 2

## 2019-08-17 MED ORDER — HYDRALAZINE HCL 25 MG PO TABS
25.0000 mg | ORAL_TABLET | Freq: Three times a day (TID) | ORAL | Status: DC | PRN
  Administered 2019-08-17: 25 mg via ORAL
  Filled 2019-08-17: qty 1

## 2019-08-17 MED ORDER — CARVEDILOL 6.25 MG PO TABS
6.2500 mg | ORAL_TABLET | Freq: Two times a day (BID) | ORAL | Status: DC
  Administered 2019-08-17: 6.25 mg via ORAL
  Filled 2019-08-17: qty 1

## 2019-08-17 MED ORDER — ONDANSETRON HCL 4 MG/2ML IJ SOLN: 4.0000 mg | Freq: Four times a day (QID) | INTRAMUSCULAR | Status: DC | PRN

## 2019-08-17 MED ORDER — MORPHINE SULFATE (PF) 2 MG/ML IV SOLN
2.0000 mg | INTRAVENOUS | Status: DC | PRN
  Administered 2019-08-17: 04:00:00 2 mg via INTRAVENOUS
  Filled 2019-08-17: qty 1

## 2019-08-17 MED ORDER — CLOPIDOGREL BISULFATE 75 MG PO TABS
75.0000 mg | ORAL_TABLET | Freq: Every day | ORAL | Status: DC
  Administered 2019-08-17: 75 mg via ORAL
  Filled 2019-08-17 (×2): qty 1

## 2019-08-17 MED ORDER — ENOXAPARIN SODIUM 40 MG/0.4ML ~~LOC~~ SOLN
40.0000 mg | SUBCUTANEOUS | Status: DC
  Administered 2019-08-17: 40 mg via SUBCUTANEOUS
  Filled 2019-08-17 (×2): qty 0.4

## 2019-08-17 MED ORDER — METOPROLOL SUCCINATE ER 25 MG PO TB24
25.0000 mg | ORAL_TABLET | Freq: Every day | ORAL | Status: DC
  Administered 2019-08-17: 25 mg via ORAL
  Filled 2019-08-17: qty 1

## 2019-08-17 MED ORDER — MECLIZINE HCL 25 MG PO TABS
25.0000 mg | ORAL_TABLET | Freq: Two times a day (BID) | ORAL | Status: DC | PRN
  Filled 2019-08-17: qty 1

## 2019-08-17 MED ORDER — CARVEDILOL 6.25 MG PO TABS: 6.2500 mg | ORAL_TABLET | Freq: Two times a day (BID) | ORAL | 0 refills | Status: DC

## 2019-08-17 MED ORDER — ISOSORBIDE MONONITRATE ER 30 MG PO TB24
30.0000 mg | ORAL_TABLET | Freq: Every day | ORAL | Status: DC
  Administered 2019-08-17: 30 mg via ORAL
  Filled 2019-08-17: qty 1

## 2019-08-17 MED ORDER — SODIUM CHLORIDE 0.9% FLUSH
3.0000 mL | Freq: Once | INTRAVENOUS | Status: AC
  Administered 2019-08-17: 3 mL via INTRAVENOUS

## 2019-08-17 MED ORDER — FLUTICASONE PROPIONATE 50 MCG/ACT NA SUSP: 1.0000 | Freq: Every day | NASAL | Status: DC | PRN

## 2019-08-17 MED ORDER — LEVOTHYROXINE SODIUM 88 MCG PO TABS
88.0000 ug | ORAL_TABLET | Freq: Every day | ORAL | Status: DC
  Administered 2019-08-17: 88 ug via ORAL
  Filled 2019-08-17: qty 1

## 2019-08-17 MED ORDER — NITROGLYCERIN 0.4 MG SL SUBL
0.4000 mg | SUBLINGUAL_TABLET | SUBLINGUAL | Status: DC | PRN
  Administered 2019-08-17 (×2): 0.4 mg via SUBLINGUAL
  Filled 2019-08-17: qty 1

## 2019-08-17 NOTE — Progress Notes (Signed)
  Echocardiogram 2D Echocardiogram has been performed.  Burnett Kanaris 08/17/2019, 2:13 PM

## 2019-08-17 NOTE — Progress Notes (Signed)
Patient d/c home this evening wheeled out by the Aide. D/c instructions given and all questions answered. Assessment remained unchanged prior to discharge.

## 2019-08-17 NOTE — H&P (Signed)
History and Physical    XITLALY AULT WOE:[id number] DOB: [birth year] DOA: 08/17/2019  Referring MD/NP/PA:   PCP: [name], MD   Patient coming from:  The patient is coming from home.  At baseline, pt is independent for most of ADL.        Chief Complaint: chest pain  HPI: [name] is a 68 y.o. female with medical history significant of HTN, HLD, GERD, hypothyroidism, fibromyalgia, Takotsubo cardiomyopathy, [place], CAD, MI, obesity, who presents with chest pain.  Patient states that her chest pain started in midnight, which is located in the front chest, moderate, pressure like pain, radiating to the jaw and left arm.  It is associated with nausea.  Patient does not have shortness breath, cough, fever or chills.  No vomiting, diarrhea or abdominal pain.  No symptoms of UTI or unilateral weakness. No tenderness in the calf areas.  Cardiac cath 05/08/18:  Acute coronary syndrome with anterior wall motion abnormality and essentially widely patent coronary arteries.  Suspect variant stress cardiomyopathy.  (Cannot totally exclude transient occlusion and recanalization of the first diagonal which is relatively small and has moderate somewhat hazy disease in the mid segment up to 70%.)  Normal left main  LAD is large, wraps around the left ventricular apex, and gives origin to 3 diagonal branches, the second of which is discussed above with mid vessel 70% narrowing in an artery that is less than 2 mm in diameter.  Beyond that there is either a small branch or occlusion of the branch.  Images are ambiguous.  Circumflex is large, first marginal and second marginal widely patent.  Second marginal bifurcates.  Right coronary is tortuous but widely patent.  Abnormal left ventricular wall motion with mid to distal anterior wall akinesis, EF 45%, and elevated LVEDP consistent with acute combined systolic and diastolic heart failure.  ED Course: pt was found to have troponin 2, WBC 8.0, pending  COVID-19 test, electrolytes renal function okay, temperature normal, blood pressure 135/90, bradycardia, oxygen saturation 96% on room air.  Chest x-ray negative.  Review of Systems:   General: no fevers, chills, no body weight gain, has fatigue HEENT: no blurry vision, hearing changes or sore throat Respiratory: no dyspnea, coughing, wheezing CV: has chest pain, no palpitations GI: has nausea, no vomiting, abdominal pain, diarrhea, constipation GU: no dysuria, burning on urination, increased urinary frequency, hematuria  Ext: no leg edema Neuro: no unilateral weakness, numbness, or tingling, no vision change or hearing loss Skin: no rash, no skin tear. MSK: No muscle spasm, no deformity, no limitation of range of movement in spin Heme: No easy bruising.  Travel history: No recent long distant travel.  Allergy:  Allergies  Allergen Reactions  . Aspirin Other (See Comments)    unknown  . Gadolinium Hives    patient unsure of which kind of dye she had a reaction to until reaction to Magnevist - may be allergic to x-ray contrast, Onset Date: [id number]   . Iohexol Hives    patient unsure of which kind of dye she had a reaction to until reaction to Magnevist - may be allergic to x-ray contrast, Onset Date: [id number]  . Livalo [Pitavastatin] Other (See Comments)    myalgias  . Hydrocodone-Acetaminophen Other (See Comments)  . Lipitor [Atorvastatin] Other (See Comments)    Myalgias    Past Medical History:  Diagnosis Date  . Anemia   . Arthritis   . Cholelithiasis   . Chronic back pain   .  Constipation   . Esophageal reflux   . Fibromyalgia   . Hyperlipidemia   . Hypertension   . Hypothyroid   . IBS (irritable bowel syndrome)   . Internal hemorrhoids without mention of complication   . Left leg pain   . Personal history of colonic polyps 05/06/2010   ADENOMATOUS POLYP    Past Surgical History:  Procedure Laterality Date  . ABDOMINAL HYSTERECTOMY    . CERVICAL DISC  SURGERY     PLATE IN NECK & BACK  . CHOLECYSTECTOMY    . COLONOSCOPY  09/27/2011   NORMAL   . LEFT HEART CATH AND CORONARY ANGIOGRAPHY N/A 05/08/2018   Procedure: LEFT HEART CATH AND CORONARY ANGIOGRAPHY;  Surgeon: Belva Crome, MD;  Location: [place] [facility];  Service: Cardiovascular;  Laterality: N/A;  . LUMBAR [place]    . TONSILLECTOMY      Social History:  reports that she has never smoked. She has never used smokeless tobacco. She reports that she does not drink alcohol or use drugs.  Family History:  Family History  Problem Relation Age of Onset  . Diabetes Mother        aunts and uncles  . Kidney disease Mother        stage 3  . Lung disease Mother        colapsed lung/in hospice  . COPD Mother   . Hypertension Mother   . Dementia Mother   . Breast cancer Maternal Aunt   . Heart failure Father        Mother  . Breast cancer Cousin   . Colon cancer Neg Hx      Prior to Admission medications   Medication Sig Start Date End Date Taking? Authorizing Provider  clopidogrel (PLAVIX) 75 MG tablet Take 1 tablet (75 mg total) by mouth daily with breakfast. 04/24/19  Yes [name], MD  fluticasone [facility]) 50 MCG/ACT nasal spray Place 1 spray into both nostrils daily. Patient taking differently: Place 1 spray into both nostrils daily as needed for allergies or rhinitis.  05/12/18  Yes Duke, Tami Lin, PA  indapamide (LOZOL) 1.25 MG tablet Take 1 tablet (1.25 mg total) by mouth daily. 08/13/19  Yes [name], MD  isosorbide mononitrate (IMDUR) 30 MG 24 hr tablet Take 1 tablet (30 mg total) by mouth daily. 04/24/19  Yes [name], MD  levothyroxine (SYNTHROID) 88 MCG tablet TAKE 1 TABLET BY MOUTH EVERY DAY Patient taking differently: Take 88 mcg by mouth daily before breakfast.  04/10/19  Yes [name], MD  linaclotide [facility]) 145 MCG CAPS capsule Take 1 capsule (145 mcg total) by mouth daily before breakfast. 03/20/19  Yes [name], MD   losartan (COZAAR) 25 MG tablet Take 1 tablet (25 mg total) by mouth daily. 04/24/19  Yes [name], MD  meclizine (ANTIVERT) 25 MG tablet Take 1 tablet (25 mg total) by mouth 2 (two) times daily as needed for dizziness. 08/04/19  Yes Drenda Freeze, MD  metoprolol succinate (TOPROL-XL) 25 MG 24 hr tablet Take 1 tablet (25 mg total) by mouth daily. 04/24/19  Yes [name], MD  nitroGLYCERIN (NITROSTAT) 0.4 MG SL tablet Place 1 tablet (0.4 mg total) under the tongue every 5 (five) minutes as needed for chest pain. 05/11/18  Yes Duke, Tami Lin, PA  rosuvastatin (CRESTOR) 10 MG tablet TAKE 1 TABLET BY MOUTH EVERY DAY Patient taking differently: Take 10 mg by mouth daily.  07/12/19  Yes [name],  Legrand Como, MD  Vitamin D, Ergocalciferol, (DRISDOL) 50000 units CAPS capsule TAKE 1 CAPSULE (50,000 UNITS TOTAL) BY MOUTH EVERY 7 (SEVEN) DAYS. Patient taking differently: Take 50,000 Units by mouth every Thursday.  07/27/18  Yes Lyndal Pulley, DO    Physical Exam: Vitals:   08/17/19 0130 08/17/19 0131 08/17/19 0215 08/17/19 0230  BP: (!) 150/84 (!) 150/84 (!) 167/82 135/90  Pulse: 66 67 (!) 57 (!) 59  Resp: 13 14 (!) 21 13  Temp:  98.5 F (36.9 C)    TempSrc:  Oral    SpO2: 96% 98% 97% 96%   General: Not in acute distress HEENT:       Eyes: PERRL, EOMI, no scleral icterus.       ENT: No discharge from the ears and nose, no pharynx injection, no tonsillar enlargement.        Neck: No JVD, no bruit, no mass felt. Heme: No neck lymph node enlargement. Cardiac: S1/S2, RRR, No murmurs, No gallops or rubs. Respiratory: No rales, wheezing, rhonchi or rubs. GI: Soft, nondistended, nontender, no rebound pain, no organomegaly, BS present. GU: No hematuria Ext: No pitting leg edema bilaterally. 2+DP/PT pulse bilaterally. Musculoskeletal: No joint deformities, No joint redness or warmth, no limitation of ROM in spin. Skin: No rashes.  Neuro: Alert, oriented X3, cranial nerves II-XII grossly  intact, moves all extremities normally. Psych: Patient is not psychotic, no suicidal or hemocidal ideation.  Labs on Admission: I have personally reviewed following labs and imaging studies  CBC: Recent Labs  Lab 08/13/19 1611 08/17/19 0139  WBC 8.0 8.0  NEUTROABS 4.1  --   HGB 13.7 13.0  HCT 41.2 36.6  MCV 81.4 79.0*  PLT 287.0 956   Basic Metabolic Panel: Recent Labs  Lab 08/13/19 1611 08/17/19 0139  NA 141 139  K 4.0 3.8  CL 103 107  CO2 28 25  GLUCOSE 102* 128*  BUN 5* 8  CREATININE 0.75 0.77  CALCIUM 9.9 9.0   GFR: Estimated Creatinine Clearance: 74.9 mL/min (by C-G formula based on SCr of 0.77 mg/dL). Liver Function Tests: Recent Labs  Lab 08/13/19 1611  AST 27  ALT 20  ALKPHOS 87  BILITOT 1.1  PROT 7.7  ALBUMIN 4.2   No results for input(s): LIPASE, AMYLASE in the last 168 hours. No results for input(s): AMMONIA in the last 168 hours. Coagulation Profile: No results for input(s): INR, PROTIME in the last 168 hours. Cardiac Enzymes: No results for input(s): CKTOTAL, CKMB, CKMBINDEX, TROPONINI in the last 168 hours. BNP (last 3 results) No results for input(s): PROBNP in the last 8760 hours. HbA1C: No results for input(s): HGBA1C in the last 72 hours. CBG: No results for input(s): GLUCAP in the last 168 hours. Lipid Profile: No results for input(s): CHOL, HDL, LDLCALC, TRIG, CHOLHDL, LDLDIRECT in the last 72 hours. Thyroid Function Tests: No results for input(s): TSH, T4TOTAL, FREET4, T3FREE, THYROIDAB in the last 72 hours. Anemia Panel: No results for input(s): VITAMINB12, FOLATE, FERRITIN, TIBC, IRON, RETICCTPCT in the last 72 hours. Urine analysis:    Component Value Date/Time   COLORURINE YELLOW 03/07/2014 [place] 03/07/2014 0844   LABSPEC >=1.030 (A) 03/07/2014 0844   PHURINE 5.5 03/07/2014 [place] 03/07/2014 0844   HGBUR NEGATIVE 03/07/2014 [place] 03/07/2014 [place] 03/07/2014 0844   PROTEINUR NEGATIVE 08/21/2009 1115   UROBILINOGEN 1.0 03/07/2014 0844   NITRITE NEGATIVE 03/07/2014 0844   LEUKOCYTESUR  NEGATIVE 03/07/2014 0844   Sepsis Labs: @LABRCNTIP (procalcitonin:4,lacticidven:4) )No results found for this or any previous visit (from the past 240 hour(s)).   Radiological Exams on Admission: Dg Chest 2 View  Result Date: 08/17/2019 CLINICAL DATA:  Chest pain EXAM: CHEST - 2 VIEW COMPARISON:  05/13/2018 FINDINGS: The heart size and mediastinal contours are within normal limits. Both lungs are clear. The visualized skeletal structures are unremarkable. IMPRESSION: No active cardiopulmonary disease. Electronically Signed   By: Inez Catalina M.D.   On: 08/17/2019 02:05     EKG: Independently reviewed.  Sinus rhythm, QTC 404, low voltage, left axis deviation.  Assessment/Plan Principal Problem:   Chest pain Active Problems:   Hypothyroidism   Hyperlipidemia LDL goal <70   Essential hypertension   GERD (gastroesophageal reflux disease)   CAD (coronary artery disease)   Chest pain and hx of CAD: initial trop negative. EKG no acute ischemia.   - will place on Tele bed for obs - Trend Trop - Repeat EKG in the am  - prn Nitroglycerin, Morphine, plavix and crestor - Risk factor stratification: will check FLP and A1C  - check UDS - inpt card consult was requested via Epic  Hypothyroidism: -Synthroid  Hyperlipidemia LDL goal <70: -Crestor  Essential hypertension: -Continue indapamide, losartan and metoprolol -PRN oral hydralazine  GERD (gastroesophageal reflux disease): -protonix    DVT ppx: SQ Lovenox Code Status: Full code Family Communication: None at bed side.     Disposition Plan:  Anticipate discharge back to previous home environment Consults called:  none Admission status: Obs / tele     Date of Service 08/17/2019    [place] [facility]   If 7PM-7AM, please contact  night-coverage [email or web address] Password [email or web address] 08/17/2019, 3:21 AM

## 2019-08-17 NOTE — ED Provider Notes (Signed)
[place] EMERGENCY DEPARTMENT Provider Note   CSN: [id number] Arrival date & time: 08/17/19  0127     History   Chief Complaint Chief Complaint  Patient presents with  . Chest Pain    HPI [name] is a 68 y.o. female.  HPI: A 68 year old patient with a history of hypertension and hypercholesterolemia presents for evaluation of chest pain. Initial onset of pain was approximately 1-3 hours ago. The patient's chest pain is described as heaviness/pressure/tightness, is not worse with exertion and is relieved by nitroglycerin. The patient complains of nausea and reports some diaphoresis. The patient's chest pain is middle- or left-sided, is not well-localized, is not sharp and does radiate to the arms/jaw/neck. The patient has no history of stroke, has no history of peripheral artery disease, has not smoked in the past 90 days, denies any history of treated diabetes, has no relevant family history of coronary artery disease (first degree relative at less than age 30) and does not have an elevated BMI (>=30).   The history is provided by the patient.  Chest Pain Pain location:  Substernal area Pain quality: pressure   Radiates to: back and neck. Pain severity:  Severe Onset quality:  Sudden Duration:  2 hours Timing:  Constant Progression:  Improving Chronicity:  New Relieved by:  Nitroglycerin Worsened by:  Nothing Associated symptoms: fatigue and nausea   Associated symptoms: no abdominal pain, no cough, no fever and no shortness of breath   Associated symptoms comment:  "clammy"  Patient reports his pain feels similar to her prior MI.  She has a allergy to aspirin.  She received 2 nitros with some relief. She does report recent fatigue, but otherwise been well.  No vomiting or diarrhea.  No recent rectal bleeding.  No fevers or cough  Past Medical History:  Diagnosis Date  . Anemia   . Arthritis   . Cholelithiasis   . Chronic back pain   .  Constipation   . Esophageal reflux   . Fibromyalgia   . Hyperlipidemia   . Hypertension   . Hypothyroid   . IBS (irritable bowel syndrome)   . Internal hemorrhoids without mention of complication   . Left leg pain   . Personal history of colonic polyps 05/06/2010   ADENOMATOUS POLYP    Patient Active Problem List   Diagnosis Date Noted  . Takotsubo cardiomyopathy 02/26/2019  . NASH (nonalcoholic steatohepatitis) 01/22/2019  . Chronic idiopathic constipation 01/12/2019  . Arthritis of carpometacarpal [facility]) joint of left thumb 09/25/2018  . Degenerative arthritis of left knee 05/24/2018  . CAD (coronary artery disease) 05/16/2018  . Vitamin D deficiency 11/11/2017  . Greater trochanteric bursitis of left hip 08/29/2017  . Degenerative disc disease, lumbar 08/29/2017  . Prediabetes 06/14/2016  . Obesity (BMI 30.0-34.9) 06/14/2016  . Routine general medical examination at a health care facility 12/31/2014  . Osteopenia 07/20/2013  . GERD (gastroesophageal reflux disease) 09/16/2011  . Generalized anxiety disorder 09/16/2011  . Constipation, slow transit 09/16/2011  . DJD (degenerative joint disease) of knee 06/25/2011  . Pure hyperglyceridemia 06/24/2011  . Hyperlipidemia LDL goal <70 07/30/2009  . Hypothyroidism 04/25/2009  . Essential hypertension 04/25/2009  . FIBROMYALGIA 04/25/2009    Past Surgical History:  Procedure Laterality Date  . ABDOMINAL HYSTERECTOMY    . CERVICAL DISC SURGERY     PLATE IN NECK & BACK  . CHOLECYSTECTOMY    . COLONOSCOPY  09/27/2011   NORMAL   . LEFT HEART  CATH AND CORONARY ANGIOGRAPHY N/A 05/08/2018   Procedure: LEFT HEART CATH AND CORONARY ANGIOGRAPHY;  Surgeon: Belva Crome, MD;  Location: [place] [facility];  Service: Cardiovascular;  Laterality: N/A;  . LUMBAR [place]    . TONSILLECTOMY       OB History    Gravida  3   Para  2   Term      Preterm      AB  1   Living  2     SAB  1   TAB      Ectopic       Multiple      Live Births               Home Medications    Prior to Admission medications   Medication Sig Start Date End Date Taking? Authorizing Provider  clopidogrel (PLAVIX) 75 MG tablet Take 1 tablet (75 mg total) by mouth daily with breakfast. 04/24/19   [name], MD  fluticasone [facility]) 50 MCG/ACT nasal spray Place 1 spray into both nostrils daily. Patient taking differently: Place 1 spray into both nostrils daily as needed for allergies or rhinitis.  05/12/18   Duke, Tami Lin, PA  indapamide (LOZOL) 1.25 MG tablet Take 1 tablet (1.25 mg total) by mouth daily. 08/13/19   [name], MD  isosorbide mononitrate (IMDUR) 30 MG 24 hr tablet Take 1 tablet (30 mg total) by mouth daily. 04/24/19   [name], MD  levothyroxine (SYNTHROID) 88 MCG tablet TAKE 1 TABLET BY MOUTH EVERY DAY Patient taking differently: Take 88 mcg by mouth daily before breakfast.  04/10/19   [name], MD  linaclotide [facility]) 145 MCG CAPS capsule Take 1 capsule (145 mcg total) by mouth daily before breakfast. 03/20/19   [name], MD  losartan (COZAAR) 25 MG tablet Take 1 tablet (25 mg total) by mouth daily. 04/24/19   [name], MD  meclizine (ANTIVERT) 25 MG tablet Take 1 tablet (25 mg total) by mouth 2 (two) times daily as needed for dizziness. 08/04/19   Drenda Freeze, MD  metoprolol succinate (TOPROL-XL) 25 MG 24 hr tablet Take 1 tablet (25 mg total) by mouth daily. 04/24/19   [name], MD  nitroGLYCERIN (NITROSTAT) 0.4 MG SL tablet Place 1 tablet (0.4 mg total) under the tongue every 5 (five) minutes as needed for chest pain. 05/11/18   Duke, Tami Lin, PA  rosuvastatin (CRESTOR) 10 MG tablet TAKE 1 TABLET BY MOUTH EVERY DAY Patient taking differently: Take 10 mg by mouth daily.  07/12/19   [name], MD  Vitamin D, Ergocalciferol, (DRISDOL) 50000 units CAPS capsule TAKE 1 CAPSULE (50,000 UNITS TOTAL) BY MOUTH EVERY 7 (SEVEN) DAYS. 07/27/18   Lyndal Pulley, DO    Family History Family History  Problem Relation Age of Onset  . Diabetes Mother        aunts and uncles  . Kidney disease Mother        stage 3  . Lung disease Mother        colapsed lung/in hospice  . COPD Mother   . Hypertension Mother   . Dementia Mother   . Breast cancer Maternal Aunt   . Heart failure Father        Mother  . Breast cancer Cousin   . Colon cancer Neg Hx     Social History Social History   Tobacco Use  . Smoking status: Never Smoker  . Smokeless  tobacco: Never Used  Substance Use Topics  . Alcohol use: No    Alcohol/week: 0.0 standard drinks  . Drug use: No     Allergies   Aspirin, Gadolinium, Iohexol, Livalo [pitavastatin], Hydrocodone-acetaminophen, and Lipitor [atorvastatin]   Review of Systems Review of Systems  Constitutional: Positive for fatigue. Negative for fever.  Respiratory: Negative for cough and shortness of breath.   Cardiovascular: Positive for chest pain.  Gastrointestinal: Positive for nausea. Negative for abdominal pain and blood in stool.  All other systems reviewed and are negative.    Physical Exam Updated Vital Signs BP (!) 150/84 (BP Location: Left Arm)   Pulse 67   Temp 98.5 F (36.9 C) (Oral)   Resp 14   SpO2 98%   Physical Exam CONSTITUTIONAL: Well developed/well nourished HEAD: Normocephalic/atraumatic EYES: EOMI/PERRL ENMT: Mucous membranes moist NECK: supple no meningeal signs SPINE/BACK:entire spine nontender CV: S1/S2 noted, no murmurs/rubs/gallops noted LUNGS: Lungs are clear to auscultation bilaterally, no apparent distress ABDOMEN: soft, nontender, no rebound or guarding GU:no cva tenderness NEURO: Pt is awake/alert/appropriate, moves all extremitiesx4.  No facial droop.   EXTREMITIES: pulses normal/equal x4, full ROM SKIN: warm, color normal PSYCH: no abnormalities of mood noted, alert and oriented to situation   ED Treatments / Results  Labs (all labs ordered are  listed, but only abnormal results are displayed) Labs Reviewed  BASIC METABOLIC PANEL - Abnormal; Notable for the following components:      Result Value   Glucose, Bld 128 (*)    All other components within normal limits  CBC - Abnormal; Notable for the following components:   MCV 79.0 (*)    All other components within normal limits  SARS CORONAVIRUS 2 (TAT 6-24 HRS)  TROPONIN I (HIGH SENSITIVITY)    EKG EKG Interpretation  Date/Time:  Friday August 17 2019 01:30:40 EDT Ventricular Rate:  65 PR Interval:    QRS Duration: 92 QT Interval:  388 QTC Calculation: 404 R Axis:   -22 Text Interpretation:  Sinus rhythm Borderline left axis deviation Abnormal R-wave progression, early transition Minimal ST elevation, inferior leads No significant change since last tracing Confirmed by Ripley Fraise [phone]) on 08/17/2019 1:42:12 AM   Radiology Dg Chest 2 View  Result Date: 08/17/2019 CLINICAL DATA:  Chest pain EXAM: CHEST - 2 VIEW COMPARISON:  05/13/2018 FINDINGS: The heart size and mediastinal contours are within normal limits. Both lungs are clear. The visualized skeletal structures are unremarkable. IMPRESSION: No active cardiopulmonary disease. Electronically Signed   By: Inez Catalina M.D.   On: 08/17/2019 02:05    Procedures Procedures  Medications Ordered in ED Medications  nitroGLYCERIN (NITROSTAT) SL tablet 0.4 mg (0.4 mg Sublingual Given 08/17/19 0239)  sodium chloride flush (NS) 0.9 % injection 3 mL (3 mLs Intravenous Given 08/17/19 0218)  ondansetron (ZOFRAN) injection 4 mg (4 mg Intravenous Given 08/17/19 0218)     Initial Impression / Assessment and Plan / ED Course  I have reviewed the triage vital signs and the nursing notes.  Pertinent labs & imaging results that were available during my care of the patient were reviewed by me and considered in my medical decision making (see chart for details).     HEAR Score: 6  2:11 AM Patient with history of CAD  presents with chest pain.  She is still having ongoing pain but refuses further nitroglycerin.  She has an allergy to aspirin.  She does request meds for nausea 2:53 AM Patient did accept nitroglycerin with some improvement  in her pain and pressure. Due to heart score of 6, with concerning history of chest pain with previous abnormal cardiac cath, will admit to the hospital patient prefers to be admitted. 3:09 AM Discussed with Dr. Blaine Hamper for admission.  Final Clinical Impressions(s) / ED Diagnoses   Final diagnoses:  Chest pain, rule out acute myocardial infarction    ED Discharge Orders    None       Ripley Fraise, MD 08/17/19 [phone]
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
## 2019-08-17 NOTE — Discharge Summary (Signed)
Discharge Summary  [name] PZW:[id number] DOB: Oct 22, 1951  PCP: Emily Lima, MD  Admit date: 08/17/2019 Discharge date: 08/17/2019  Time spent: 30 mins  Recommendations for Outpatient Follow-up:  1. PCP in 1 week 2. Cardiology as scheduled  Discharge Diagnoses:  Active Hospital Problems   Diagnosis Date Noted  . Chest pain 08/17/2019  . CAD (coronary artery disease) 05/16/2018  . GERD (gastroesophageal reflux disease) 09/16/2011  . Hyperlipidemia LDL goal <70 07/30/2009  . Hypothyroidism 04/25/2009  . Essential hypertension 04/25/2009    Resolved Hospital Problems  No resolved problems to display.    Discharge Condition: Stable  Diet recommendation: Heart healthy  Vitals:   08/17/19 1400 08/17/19 1417  BP:  119/70  Pulse: 72 70  Resp:  18  Temp:  98.1 F (36.7 C)  SpO2:  94%    History of present illness:  [name] is a 68 y.o. female with medical history significant of HTN, HLD, GERD, hypothyroidism, fibromyalgia, Takotsubo cardiomyopathy, Nash, CAD, MI, obesity, who presents with chest pain. Patient states that her chest pain started in midnight, which is located in the front chest, moderate, pressure like pain, radiating to the jaw and left arm.  It is associated with nausea.  Patient does not have shortness breath, cough, fever or chills.  No vomiting, diarrhea or abdominal pain.  No symptoms of UTI or unilateral weakness. No tenderness in the calf areas.  Patient admitted for further management.    Today, patient denies any new complaints, denies any further chest pain.  Reported that she is having some emotional stress.  Denies any worsening shortness of breath, dizziness, diaphoresis, abdominal pain, nausea/vomiting, fever/chills.  Patient stable to be discharged from a cardiology standpoint, with close follow-up.  Hospital Course:  Principal Problem:   Chest pain Active Problems:   Hypothyroidism   Hyperlipidemia LDL goal <70   Essential  hypertension   GERD (gastroesophageal reflux disease)   CAD (coronary artery disease)  Chest pain/hx of CAD  Currently chest pain-free Troponin x2 negative, EKG no acute ischemia LDL 55, A1c 6.1 Echo with EF 70 to 75%, left ventricular diastolic Doppler parameters are consistent with impaired relaxation pattern Cardiology consulted, recommending switching metoprolol to Coreg, statin amlodipine, discontinue inadapamide.  Follow-up closely with cardiology Continue prn Nitroglycerin, plavix and crestor Follow-up with cardiology  Hypothyroidism Continue Synthroid  Hyperlipidemia Continue Crestor  Essential hypertension Continue losartan, start amlodipine, discontinue inadapamide, start Coreg, discontinue metoprolol Follow-up with PCP/cardiology  GERD (gastroesophageal reflux disease): Continue protonix         Malnutrition Type:      Malnutrition Characteristics:      Nutrition Interventions:      Estimated body mass index is 28.97 kg/m as calculated from the following:   Height as of this encounter: 5' 7"  (1.702 m).   Weight as of this encounter: 83.9 kg.    Procedures:  None  Consultations:  Cardiology  Discharge Exam: BP 119/70 (BP Location: Left Arm)   Pulse 70   Temp 98.1 F (36.7 C) (Oral)   Resp 18   Ht 5' 7"  (1.702 m)   Wt 83.9 kg   SpO2 94%   BMI 28.97 kg/m   General: NAD Cardiovascular: S1, S2 present Respiratory: CTA B  Discharge Instructions You were cared for by a hospitalist during your hospital stay. If you have any questions about your discharge medications or the care you received while you were in the hospital after you are discharged, you can call  the unit and asked to speak with the hospitalist on call if the hospitalist that took care of you is not available. Once you are discharged, your primary care physician will handle any further medical issues. Please note that NO REFILLS for any discharge medications will be  authorized once you are discharged, as it is imperative that you return to your primary care physician (or establish a relationship with a primary care physician if you do not have one) for your aftercare needs so that they can reassess your need for medications and monitor your lab values.   Allergies as of 08/17/2019      Reactions   Aspirin Other (See Comments)   unknown   Gadolinium Hives   patient unsure of which kind of dye she had a reaction to until reaction to Magnevist - may be allergic to x-ray contrast, Onset Date: [id number]   Iohexol Hives   patient unsure of which kind of dye she had a reaction to until reaction to Magnevist - may be allergic to x-ray contrast, Onset Date: [id number]   Livalo [pitavastatin] Other (See Comments)   myalgias   Hydrocodone-acetaminophen Other (See Comments)   Lipitor [atorvastatin] Other (See Comments)   Myalgias      Medication List    STOP taking these medications   indapamide 1.25 MG tablet Commonly known as: LOZOL   metoprolol succinate 25 MG 24 hr tablet Commonly known as: TOPROL-XL     TAKE these medications   amLODipine 5 MG tablet Commonly known as: NORVASC Take 1 tablet (5 mg total) by mouth daily. Start taking on: August 18, 2019   carvedilol 6.25 MG tablet Commonly known as: COREG Take 1 tablet (6.25 mg total) by mouth 2 (two) times daily with a meal.   clopidogrel 75 MG tablet Commonly known as: PLAVIX Take 1 tablet (75 mg total) by mouth daily with breakfast.   fluticasone 50 MCG/ACT nasal spray Commonly known as: FLONASE Place 1 spray into both nostrils daily. What changed:   when to take this  reasons to take this   isosorbide mononitrate 30 MG 24 hr tablet Commonly known as: IMDUR Take 1 tablet (30 mg total) by mouth daily.   levothyroxine 88 MCG tablet Commonly known as: SYNTHROID TAKE 1 TABLET BY MOUTH EVERY DAY What changed: when to take this   linaclotide 145 MCG Caps capsule Commonly known as:  Linzess Take 1 capsule (145 mcg total) by mouth daily before breakfast.   losartan 25 MG tablet Commonly known as: COZAAR Take 1 tablet (25 mg total) by mouth daily.   meclizine 25 MG tablet Commonly known as: ANTIVERT Take 1 tablet (25 mg total) by mouth 2 (two) times daily as needed for dizziness.   nitroGLYCERIN 0.4 MG SL tablet Commonly known as: NITROSTAT Place 1 tablet (0.4 mg total) under the tongue every 5 (five) minutes as needed for chest pain.   rosuvastatin 10 MG tablet Commonly known as: CRESTOR TAKE 1 TABLET BY MOUTH EVERY DAY   Vitamin D (Ergocalciferol) 1.25 MG (50000 UT) Caps capsule Commonly known as: DRISDOL TAKE 1 CAPSULE (50,000 UNITS TOTAL) BY MOUTH EVERY 7 (SEVEN) DAYS. What changed: when to take this      Allergies  Allergen Reactions  . Aspirin Other (See Comments)    unknown  . Gadolinium Hives    patient unsure of which kind of dye she had a reaction to until reaction to Magnevist - may be allergic to x-ray contrast, Onset Date: 53299242   .  Iohexol Hives    patient unsure of which kind of dye she had a reaction to until reaction to Magnevist - may be allergic to x-ray contrast, Onset Date: [id number]  . Livalo [Pitavastatin] Other (See Comments)    myalgias  . Hydrocodone-Acetaminophen Other (See Comments)  . Lipitor [Atorvastatin] Other (See Comments)    Myalgias   Follow-up Information    Emily Lima, MD. Schedule an appointment as soon as possible for a visit in 1 week(s).   Specialty: Internal Medicine Contact information: [address] [address] [phone]        [name], MD .   Specialty: Cardiology Contact information: [phone] [address] [place] [address] [phone]            The results of significant diagnostics from this hospitalization (including imaging, microbiology, ancillary and laboratory) are listed below for reference.    Significant Diagnostic  Studies: Dg Chest 2 View  Result Date: 08/17/2019 CLINICAL DATA:  Chest pain EXAM: CHEST - 2 VIEW COMPARISON:  05/13/2018 FINDINGS: The heart size and mediastinal contours are within normal limits. Both lungs are clear. The visualized skeletal structures are unremarkable. IMPRESSION: No active cardiopulmonary disease. Electronically Signed   By: Inez Catalina M.D.   On: 08/17/2019 02:05   Ct Head Wo Contrast  Result Date: 08/04/2019 CLINICAL DATA:  Pt states she has felt dizzy and tired since yesterday. Pt denies pain, states "I just don't feel like moving". EXAM: CT HEAD WITHOUT CONTRAST TECHNIQUE: Contiguous axial images were obtained from the base of the skull through the vertex without intravenous contrast. COMPARISON:  CT head 11/14/2017 FINDINGS: Brain: No evidence of acute infarction, hemorrhage, hydrocephalus, extra-axial collection or mass lesion/mass effect. Vascular: No hyperdense vessel or unexpected calcification. Skull: Normal. Negative for fracture or focal lesion. Sinuses/Orbits: No acute finding. Other: None. IMPRESSION: No acute intracranial process. Electronically Signed   By: Audie Pinto M.D.   On: 08/04/2019 19:32    Microbiology: Recent Results (from the past 240 hour(s))  SARS CORONAVIRUS 2 (TAT 6-24 HRS) Nasopharyngeal Nasopharyngeal Swab     Status: None   Collection Time: 08/17/19  2:53 AM   Specimen: Nasopharyngeal Swab  Result Value Ref Range Status   SARS Coronavirus 2 NEGATIVE NEGATIVE Final    Comment: (NOTE) SARS-CoV-2 target nucleic acids are NOT DETECTED. The SARS-CoV-2 RNA is generally detectable in upper and lower respiratory specimens during the acute phase of infection. Negative results do not preclude SARS-CoV-2 infection, do not rule out co-infections with other pathogens, and should not be used as the sole basis for treatment or other patient management decisions. Negative results must be combined with clinical observations, patient history, and  epidemiological information. The expected result is Negative. Fact Sheet for Patients: SugarRoll.be Fact Sheet for Healthcare Providers: https://www.woods-mathews.com/ This test is not yet approved or cleared by the [place] FDA and  has been authorized for detection and/or diagnosis of SARS-CoV-2 by FDA under an Emergency Use Authorization (EUA). This EUA will remain  in effect (meaning this test can be used) for the duration of the COVID-19 declaration under Section 56 4(b)(1) of the Act, 21 U.S.C. section 360bbb-3(b)(1), unless the authorization is terminated or revoked sooner. Performed at [facility], [place] [address] [place], [place] [address]      Labs: Basic Metabolic Panel: Recent Labs  Lab 08/13/19 1611 08/17/19 0139  NA 141 139  K 4.0 3.8  CL 103 107  CO2 28 25  GLUCOSE 102* 128*  BUN 5* 8  CREATININE 0.75 0.77  CALCIUM 9.9 9.0   Liver Function Tests: Recent Labs  Lab 08/13/19 1611  AST 27  ALT 20  ALKPHOS 87  BILITOT 1.1  PROT 7.7  ALBUMIN 4.2   No results for input(s): LIPASE, AMYLASE in the last 168 hours. No results for input(s): AMMONIA in the last 168 hours. CBC: Recent Labs  Lab 08/13/19 1611 08/17/19 0139  WBC 8.0 8.0  NEUTROABS 4.1  --   HGB 13.7 13.0  HCT 41.2 36.6  MCV 81.4 79.0*  PLT 287.0 263   Cardiac Enzymes: No results for input(s): CKTOTAL, CKMB, CKMBINDEX, TROPONINI in the last 168 hours. BNP: BNP (last 3 results) No results for input(s): BNP in the last 8760 hours.  ProBNP (last 3 results) No results for input(s): PROBNP in the last 8760 hours.  CBG: No results for input(s): GLUCAP in the last 168 hours.     Signed:  Alma Friendly, MD [facility] 08/17/2019, 6:01 PM

## 2019-08-17 NOTE — Social Work (Addendum)
11:01am- Called [facility] Notification [facility]; pt is not connected with their services per the representative at [phone]. They directed CSW to call [facility]. Await return call from [facility] at [phone].  11:26am- return call from [facility]; they state they do not need notification of pt hospital arrival. If pt has further questions she can contact them at the number above.   Westley Hummer, MSW, [place] Work [phone]

## 2019-08-17 NOTE — ED Notes (Signed)
ED TO INPATIENT HANDOFF REPORT  ED Nurse Name and Phone #: Merary Garguilo, [phone]  S Name/Age/Gender [name] 68 y.o. female Room/Bed: 032C/032C  Code Status   Code Status: Full Code  Home/SNF/Other Home Patient oriented to: self, place, time and situation Is this baseline? Yes   Triage Complete: Triage complete  Chief Complaint chest pain  Triage Note Per pt she was at home tonight at midnight and she woke up with central chain pain that radiates to her jaw and in to her left arm. Pt said nauseated. Ems gave 4 mg zofran, 2 nitro's. When given nitro helped with chest pain some. No SOB, Hx of MI 2019   Allergies Allergies  Allergen Reactions  . Aspirin Other (See Comments)    unknown  . Gadolinium Hives    patient unsure of which kind of dye she had a reaction to until reaction to Magnevist - may be allergic to x-ray contrast, Onset Date: [id number]   . Iohexol Hives    patient unsure of which kind of dye she had a reaction to until reaction to Magnevist - may be allergic to x-ray contrast, Onset Date: [id number]  . Livalo [Pitavastatin] Other (See Comments)    myalgias  . Hydrocodone-Acetaminophen Other (See Comments)  . Lipitor [Atorvastatin] Other (See Comments)    Myalgias    Level of Care/Admitting Diagnosis ED Disposition    ED Disposition Condition Comment   Admit  Hospital Area: [place] [[id number]]  Level of Care: Telemetry Cardiac [103]  I expect the patient will be discharged within 24 hours: No (not a candidate for 5C-Observation unit)  Covid Evaluation: Asymptomatic Screening Protocol (No Symptoms)  Diagnosis: Chest pain [841324]  Admitting Physician: Ivor Costa [[id number]]  Attending Physician: Ivor Costa [[id number]]  PT Class (Do Not Modify): Observation [104]  PT Acc Code (Do Not Modify): Observation [10022]       B Medical/Surgery History Past Medical History:  Diagnosis Date  . Anemia   . Arthritis   . Cholelithiasis   .  Chronic back pain   . Constipation   . Esophageal reflux   . Fibromyalgia   . Hyperlipidemia   . Hypertension   . Hypothyroid   . IBS (irritable bowel syndrome)   . Internal hemorrhoids without mention of complication   . Left leg pain   . Personal history of colonic polyps 05/06/2010   ADENOMATOUS POLYP   Past Surgical History:  Procedure Laterality Date  . ABDOMINAL HYSTERECTOMY    . CERVICAL DISC SURGERY     PLATE IN NECK & BACK  . CHOLECYSTECTOMY    . COLONOSCOPY  09/27/2011   NORMAL   . LEFT HEART CATH AND CORONARY ANGIOGRAPHY N/A 05/08/2018   Procedure: LEFT HEART CATH AND CORONARY ANGIOGRAPHY;  Surgeon: Belva Crome, MD;  Location: [place] [facility];  Service: Cardiovascular;  Laterality: N/A;  . LUMBAR [place]    . TONSILLECTOMY       A IV Location/Drains/Wounds Patient Lines/Drains/Airways Status   Active Line/Drains/Airways    Name:   Placement date:   Placement time:   Site:   Days:   Peripheral IV 08/17/19 Right Antecubital   08/17/19    0136    Antecubital   less than 1          Intake/Output Last 24 hours No intake or output data in the 24 hours ending 08/17/19 4010  Labs/Imaging Results for orders placed or performed during the hospital encounter of  08/17/19 (from the past 48 hour(s))  Basic metabolic panel     Status: Abnormal   Collection Time: 08/17/19  1:39 AM  Result Value Ref Range   Sodium 139 135 - 145 mmol/L   Potassium 3.8 3.5 - 5.1 mmol/L   Chloride 107 98 - 111 mmol/L   CO2 25 22 - 32 mmol/L   Glucose, Bld 128 (H) 70 - 99 mg/dL   BUN 8 8 - 23 mg/dL   Creatinine, Ser 0.77 0.44 - 1.00 mg/dL   Calcium 9.0 8.9 - 10.3 mg/dL   GFR calc non Af Amer >60 >60 mL/min   GFR calc Af Amer >60 >60 mL/min   Anion gap 7 5 - 15    Comment: Performed at [facility], [place] [address], [place], [place] [address]  CBC     Status: Abnormal   Collection Time: 08/17/19  1:39 AM  Result Value Ref Range   WBC 8.0 4.0 - 10.5 K/uL   RBC 4.63  3.87 - 5.11 MIL/uL   Hemoglobin 13.0 12.0 - 15.0 g/dL   HCT 36.6 36.0 - 46.0 %   MCV 79.0 (L) 80.0 - 100.0 fL   MCH 28.1 26.0 - 34.0 pg   MCHC 35.5 30.0 - 36.0 g/dL   RDW 13.6 11.5 - 15.5 %   Platelets 263 150 - 400 K/uL   nRBC 0.0 0.0 - 0.2 %    Comment: Performed at [facility], [place] [address] [place], [place] [address]  Troponin I (High Sensitivity)     Status: None   Collection Time: 08/17/19  1:39 AM  Result Value Ref Range   Troponin I (High Sensitivity) 2 <18 ng/L    Comment: (NOTE) Elevated high sensitivity troponin I (hsTnI) values and significant  changes across serial measurements may suggest ACS but many other  chronic and acute conditions are known to elevate hsTnI results.  Refer to the "Links" section for chest pain algorithms and additional  guidance. Performed at [facility], [place] [address], [place], [place] [address]   SARS CORONAVIRUS 2 (TAT 6-24 HRS) Nasopharyngeal Nasopharyngeal Swab     Status: None   Collection Time: 08/17/19  2:53 AM   Specimen: Nasopharyngeal Swab  Result Value Ref Range   SARS Coronavirus 2 NEGATIVE NEGATIVE    Comment: (NOTE) SARS-CoV-2 target nucleic acids are NOT DETECTED. The SARS-CoV-2 RNA is generally detectable in upper and lower respiratory specimens during the acute phase of infection. Negative results do not preclude SARS-CoV-2 infection, do not rule out co-infections with other pathogens, and should not be used as the sole basis for treatment or other patient management decisions. Negative results must be combined with clinical observations, patient history, and epidemiological information. The expected result is Negative. Fact Sheet for Patients: SugarRoll.be Fact Sheet for Healthcare Providers: https://www.woods-mathews.com/ This test is not yet approved or cleared by the [place] FDA and  has been authorized for detection and/or diagnosis of SARS-CoV-2  by FDA under an Emergency Use Authorization (EUA). This EUA will remain  in effect (meaning this test can be used) for the duration of the COVID-19 declaration under Section 56 4(b)(1) of the Act, 21 U.S.C. section 360bbb-3(b)(1), unless the authorization is terminated or revoked sooner. Performed at [facility], [place] [address], [place], [place] [address]   Troponin I (High Sensitivity)     Status: None   Collection Time: 08/17/19  3:15 AM  Result Value Ref Range  Troponin I (High Sensitivity) 3 <18 ng/L    Comment: (NOTE) Elevated high sensitivity troponin I (hsTnI) values and significant  changes across serial measurements may suggest ACS but many other  chronic and acute conditions are known to elevate hsTnI results.  Refer to the "Links" section for chest pain algorithms and additional  guidance. Performed at [facility], [place] [address] [place], [place] [address]    Dg Chest 2 View  Result Date: 08/17/2019 CLINICAL DATA:  Chest pain EXAM: CHEST - 2 VIEW COMPARISON:  05/13/2018 FINDINGS: The heart size and mediastinal contours are within normal limits. Both lungs are clear. The visualized skeletal structures are unremarkable. IMPRESSION: No active cardiopulmonary disease. Electronically Signed   By: Inez Catalina M.D.   On: 08/17/2019 02:05    Pending Labs Unresulted Labs (From admission, onward)    Start     Ordered   08/17/19 0549  Hemoglobin A1c  Tomorrow morning,   R     08/17/19 0549   08/17/19 0549  Lipid panel  Tomorrow morning,   R    Comments: Please obtain as a fasting lipid panel - should not have eaten/ drank food for 8 hours prior to labs.    08/17/19 0549   08/17/19 0549  Rapid urine drug screen (hospital performed)  Once,   STAT     08/17/19 0549   08/17/19 0549  HIV antibody (Routine Testing)  Once,   STAT     08/17/19 0549          Vitals/Pain Today's Vitals   08/17/19 0500 08/17/19 0530 08/17/19 0545 08/17/19 0550  BP: (!) 162/74  (!) 170/79 (!) 157/74   Pulse: 68 64 60   Resp: (!) 23 17 18    Temp:      TempSrc:      SpO2: 98% 98% 97%   PainSc:    0-No pain    Isolation Precautions No active isolations  Medications Medications  nitroGLYCERIN (NITROSTAT) SL tablet 0.4 mg (0.4 mg Sublingual Given 08/17/19 0253)  morphine 2 MG/ML injection 2 mg (2 mg Intravenous Given 08/17/19 0417)  ondansetron (ZOFRAN) injection 4 mg (has no administration in time range)  enoxaparin (LOVENOX) injection 40 mg (has no administration in time range)  hydrALAZINE (APRESOLINE) tablet 25 mg (25 mg Oral Given 08/17/19 0415)  indapamide (LOZOL) tablet 1.25 mg (has no administration in time range)  isosorbide mononitrate (IMDUR) 24 hr tablet 30 mg (has no administration in time range)  losartan (COZAAR) tablet 25 mg (has no administration in time range)  metoprolol succinate (TOPROL-XL) 24 hr tablet 25 mg (has no administration in time range)  rosuvastatin (CRESTOR) tablet 10 mg (has no administration in time range)  levothyroxine (SYNTHROID) tablet 88 mcg (has no administration in time range)  linaclotide (LINZESS) capsule 145 mcg (has no administration in time range)  meclizine (ANTIVERT) tablet 25 mg (has no administration in time range)  clopidogrel (PLAVIX) tablet 75 mg (has no administration in time range)  fluticasone (FLONASE) 50 MCG/ACT nasal spray 1 spray (has no administration in time range)  sodium chloride flush (NS) 0.9 % injection 3 mL (3 mLs Intravenous Given 08/17/19 0218)  ondansetron (ZOFRAN) injection 4 mg (4 mg Intravenous Given 08/17/19 0218)    Mobility walks Low fall risk   Focused Assessments Cardiac Assessment Handoff:  Cardiac Rhythm: Normal sinus rhythm Lab Results  Component Value Date   CKTOTAL 85 11/06/2013   CKMB 0.5 06/14/2012   TROPONINI 0.47 (HH) 05/09/2018  Lab Results  Component Value Date   DDIMER 0.45 06/14/2012   Does the Patient currently have chest pain? Yes      R Recommendations: See Admitting Provider Note  Report given to:   Additional Notes:

## 2019-08-17 NOTE — ED Triage Notes (Signed)
Per pt she was at home tonight at midnight and she woke up with central chain pain that radiates to her jaw and in to her left arm. Pt said nauseated. Ems gave 4 mg zofran, 2 nitro's. When given nitro helped with chest pain some. No SOB, Hx of MI 2019

## 2019-08-18 LAB — HEMOGLOBIN A1C
Hgb A1c MFr Bld: 6 % — ABNORMAL HIGH (ref 4.8–5.6)
Mean Plasma Glucose: 126 mg/dL

## 2019-08-20 ENCOUNTER — Telehealth: Payer: Self-pay | Admitting: *Deleted

## 2019-08-20 NOTE — Telephone Encounter (Signed)
Rec'd msg on patient Ping Portal stating pt was admitted to [facility]. [facility] observation 08/17/19 @ 3:17am for Chest pain. Pt was discharged 08/17/19 7:20 PM to Home. Will call pt to follow=up and make hosp f/u appt if need.Marland KitchenJohny Chess

## 2019-08-20 NOTE — Telephone Encounter (Signed)
Transition Care Management Follow-up Telephone Call   Date discharged? 08/17/19   How have you been since you were released from the hospital? Pt states she is doing alright   Do you understand why you were in the hospital? YES   Do you understand the discharge instructions? YES   Where were you discharged to? Home   Items Reviewed:  Medications reviewed: YES. She is no longer taking the metoprolol which has been updated on med ist  Allergies reviewed: YES  Dietary changes reviewed: YES, heart healthy  Referrals reviewed: YES, pt states have appt scheduled for 09/04/19 w/cardiology   Functional Questionnaire:   Activities of Daily Living (ADLs):   She states she are independent in the following: ambulation, bathing and hygiene, feeding, continence, grooming, toileting and dressing States she doesn't need assistance she is able to get around just fine   Any transportation issues/concerns?: NO   Any patient concerns? NO   Confirmed importance and date/time of follow-up visits scheduled YES, appt 09/03/19  Provider Appointment booked with Dr. [name]  Confirmed with patient if condition begins to worsen call PCP or go to the ER.  Patient was given the office number and encouraged to call back with question or concerns.  : YES

## 2019-08-23 ENCOUNTER — Other Ambulatory Visit: Payer: Self-pay

## 2019-08-23 NOTE — Patient Outreach (Signed)
[facility]) Care Management  08/23/2019  KADIA ABAYA 1951-07-19 [id number]  Medication Adherence call to Mrs. [place] Compliant Voice message left with a call back number. Mrs. Tierno is showing past due on Pioglitazone 15 mg under [place].   [facility] Management Direct Dial [phone]  Fax [phone] [email or web address]

## 2019-08-27 NOTE — Progress Notes (Signed)
[name] [address] [place], [place] [address] Phone: [phone] Subjective:   I [name] am serving as a Education administrator for Dr. Hulan Saas.   CC: Bilateral knee pain  VFI:[id number]   01/08/2019 Ultrasound done today.  Patient does have arthritic changes but no significant inflammation.  Encourage patient to try more conservative therapy with the home exercises and bracing before we get into the more aggressive therapy such as injections again.  Patient is in agreement with the plan and will follow-up again in 2 months  Doing relatively well at this moment.  No need in changing any medical management at this time  08/28/2019 [name] is a 68 y.o. female coming in with complaint of bilateral knee pain. Patient states her knees are painful. Bilateral injections.  Patient does states is still seems to be the left greater than right.  Patient states it can affect daily activities.      Past Medical History:  Diagnosis Date  . Anemia   . Arthritis   . Cholelithiasis   . Chronic back pain   . Constipation   . Esophageal reflux   . Fibromyalgia   . Hyperlipidemia   . Hypertension   . Hypothyroid   . IBS (irritable bowel syndrome)   . Internal hemorrhoids without mention of complication   . Left leg pain   . Personal history of colonic polyps 05/06/2010   ADENOMATOUS POLYP   Past Surgical History:  Procedure Laterality Date  . ABDOMINAL HYSTERECTOMY    . CERVICAL DISC SURGERY     PLATE IN NECK & BACK  . CHOLECYSTECTOMY    . COLONOSCOPY  09/27/2011   NORMAL   . LEFT HEART CATH AND CORONARY ANGIOGRAPHY N/A 05/08/2018   Procedure: LEFT HEART CATH AND CORONARY ANGIOGRAPHY;  Surgeon: Belva Crome, MD;  Location: [facility];  Service: Cardiovascular;  Laterality: N/A;  . LUMBAR [place]    . TONSILLECTOMY     Social History   Socioeconomic History  . Marital status: Widowed    Spouse name: Not on file  . Number of  children: 2  . Years of education: Not on file  . Highest education level: Not on file  Occupational History  . Occupation: housewife    Employer: UNEMPLOYED   Social Needs  . Financial resource strain: Not hard at all  . Food insecurity    Worry: Never true    Inability: Never true  . Transportation needs    Medical: No    Non-medical: No  Tobacco Use  . Smoking status: Never Smoker  . Smokeless tobacco: Never Used  Substance and Sexual Activity  . Alcohol use: No    Alcohol/week: 0.0 standard drinks  . Drug use: No  . Sexual activity: Not Currently  Lifestyle  . Physical activity    Days per week: 0 days    Minutes per session: 0 min  . Stress: Not at all  Relationships  . Social connections    Talks on phone: More than three times a week    Gets together: More than three times a week    Attends religious service: More than 4 times per year    Active member of club or organization: Yes    Attends meetings of clubs or organizations: More than 4 times per year    Relationship status: Married  Other Topics Concern  . Not on file  Social History Narrative   Daily  Caffeine Use   Allergies  Allergen Reactions  . Aspirin Other (See Comments)    unknown  . Gadolinium Hives    patient unsure of which kind of dye she had a reaction to until reaction to Magnevist - may be allergic to x-ray contrast, Onset Date: [id number]   . Iohexol Hives    patient unsure of which kind of dye she had a reaction to until reaction to Magnevist - may be allergic to x-ray contrast, Onset Date: [id number]  . Livalo [Pitavastatin] Other (See Comments)    myalgias  . Hydrocodone-Acetaminophen Other (See Comments)  . Lipitor [Atorvastatin] Other (See Comments)    Myalgias   Family History  Problem Relation Age of Onset  . Diabetes Mother        aunts and uncles  . Kidney disease Mother        stage 3  . Lung disease Mother        colapsed lung/in hospice  . COPD Mother   . Hypertension  Mother   . Dementia Mother   . Breast cancer Maternal Aunt   . Heart failure Father        Mother  . Breast cancer Cousin   . Colon cancer Neg Hx     Current Outpatient Medications (Endocrine & Metabolic):  .  levothyroxine (SYNTHROID) 88 MCG tablet, TAKE 1 TABLET BY MOUTH EVERY DAY (Patient taking differently: Take 88 mcg by mouth daily before breakfast. )  Current Outpatient Medications (Cardiovascular):  .  amLODipine (NORVASC) 5 MG tablet, Take 1 tablet (5 mg total) by mouth daily. .  carvedilol (COREG) 6.25 MG tablet, Take 1 tablet (6.25 mg total) by mouth 2 (two) times daily with a meal. .  isosorbide mononitrate (IMDUR) 30 MG 24 hr tablet, Take 1 tablet (30 mg total) by mouth daily. Marland Kitchen  losartan (COZAAR) 25 MG tablet, Take 1 tablet (25 mg total) by mouth daily. .  nitroGLYCERIN (NITROSTAT) 0.4 MG SL tablet, Place 1 tablet (0.4 mg total) under the tongue every 5 (five) minutes as needed for chest pain. .  rosuvastatin (CRESTOR) 10 MG tablet, TAKE 1 TABLET BY MOUTH EVERY DAY (Patient taking differently: Take 10 mg by mouth daily. )  Current Outpatient Medications (Respiratory):  .  fluticasone (FLONASE) 50 MCG/ACT nasal spray, Place 1 spray into both nostrils daily. (Patient taking differently: Place 1 spray into both nostrils daily as needed for allergies or rhinitis. )   Current Outpatient Medications (Hematological):  .  clopidogrel (PLAVIX) 75 MG tablet, Take 1 tablet (75 mg total) by mouth daily with breakfast.  Current Outpatient Medications (Other):  .  linaclotide (LINZESS) 145 MCG CAPS capsule, Take 1 capsule (145 mcg total) by mouth daily before breakfast. .  meclizine (ANTIVERT) 25 MG tablet, Take 1 tablet (25 mg total) by mouth 2 (two) times daily as needed for dizziness. .  Vitamin D, Ergocalciferol, (DRISDOL) 50000 units CAPS capsule, TAKE 1 CAPSULE (50,000 UNITS TOTAL) BY MOUTH EVERY 7 (SEVEN) DAYS. (Patient taking differently: Take 50,000 Units by mouth every  Thursday. )    Past medical history, social, surgical and family history all reviewed in electronic medical record.  No pertanent information unless stated regarding to the chief complaint.   Review of Systems:  No headache, visual changes, nausea, vomiting, diarrhea, constipation, dizziness, abdominal pain, skin rash, fevers, chills, night sweats, weight loss, swollen lymph nodes,  chest pain, shortness of breath, mood changes.  Positive muscle aches and body aches  Objective  Blood  pressure 110/70, pulse 65, height 5' 7"  (1.702 m), weight 186 lb (84.4 kg), SpO2 97 %.    General: No apparent distress alert and oriented x3 mood and affect normal, dressed appropriately.  HEENT: Pupils equal, extraocular movements intact  Respiratory: Patient's speak in full sentences and does not appear short of breath  Cardiovascular: No lower extremity edema, non tender, no erythema  Skin: Warm dry intact with no signs of infection or rash on extremities or on axial skeleton.  Abdomen: Soft nontender  Neuro: Cranial nerves II through XII are intact, neurovascularly intact in all extremities with 2+ DTRs and 2+ pulses.  Lymph: No lymphadenopathy of posterior or anterior cervical chain or axillae bilaterally.  Gait antalgic MSK:  Non tender with full range of motion and good stability and symmetric strength and tone of shoulders, elbows, wrist, hip, knee and ankle bilaterally  Knee: Bilateral valgus deformity noted. Large thigh to calf ratio.  Tender to palpation over medial and PF joint line.  ROM full in flexion and extension and lower leg rotation. instability with valgus force.  painful patellar compression. Patellar glide with moderate crepitus. Patellar and quadriceps tendons unremarkable. Hamstring and quadriceps strength is normal.     After informed written and verbal consent, patient was seated on exam table. Left knee was prepped with alcohol swab and utilizing anterolateral approach,  patient's left knee space was injected with 4:1  marcaine 0.5%: Kenalog 23m/dL. Patient tolerated the procedure well without immediate complications. Impression and Recommendations:     This case required medical decision making of moderate complexity. The above documentation has been reviewed and is accurate and complete ZLyndal Pulley DO       Note: This dictation was prepared with Dragon dictation along with smaller phrase technology. Any transcriptional errors that result from this process are unintentional.

## 2019-08-28 ENCOUNTER — Encounter: Payer: Self-pay | Admitting: Family Medicine

## 2019-08-28 ENCOUNTER — Ambulatory Visit (INDEPENDENT_AMBULATORY_CARE_PROVIDER_SITE_OTHER): Payer: Medicare Other | Admitting: Family Medicine

## 2019-08-28 ENCOUNTER — Other Ambulatory Visit: Payer: Self-pay

## 2019-08-28 DIAGNOSIS — M1712 Unilateral primary osteoarthritis, left knee: Secondary | ICD-10-CM | POA: Diagnosis not present

## 2019-08-28 NOTE — Patient Instructions (Signed)
Good to see you See me again in 3 months

## 2019-08-28 NOTE — Assessment & Plan Note (Signed)
Repeat injection given today.  Tolerated the procedure well.  Discussed icing regimen and home exercises, discussed respiratory syndrome.  Board.  Patient is to increase activity as tolerated.  We discussed the possibility of viscosupplementation if not improvement.  Otherwise follow-up again in 3 months

## 2019-08-30 ENCOUNTER — Other Ambulatory Visit: Payer: Self-pay

## 2019-08-30 ENCOUNTER — Ambulatory Visit
Admission: RE | Admit: 2019-08-30 | Discharge: 2019-08-30 | Disposition: A | Discharge status: Home / Self Care | Payer: Medicare Other | Source: Ambulatory Visit | Attending: Internal Medicine | Admitting: Internal Medicine

## 2019-08-30 DIAGNOSIS — Z1231 Encounter for screening mammogram for malignant neoplasm of breast: Secondary | ICD-10-CM | POA: Diagnosis not present

## 2019-08-30 LAB — HM MAMMOGRAPHY

## 2019-09-03 ENCOUNTER — Encounter: Payer: Self-pay | Admitting: Internal Medicine

## 2019-09-03 ENCOUNTER — Ambulatory Visit (INDEPENDENT_AMBULATORY_CARE_PROVIDER_SITE_OTHER): Payer: Medicare Other | Admitting: Internal Medicine

## 2019-09-03 ENCOUNTER — Other Ambulatory Visit: Payer: Self-pay

## 2019-09-03 VITALS — BP 138/78 | HR 63 | Temp 98.5°F | Resp 16 | Ht 67.0 in | Wt 184.2 lb

## 2019-09-03 DIAGNOSIS — K5904 Chronic idiopathic constipation: Secondary | ICD-10-CM | POA: Diagnosis not present

## 2019-09-03 DIAGNOSIS — I25118 Atherosclerotic heart disease of native coronary artery with other forms of angina pectoris: Secondary | ICD-10-CM

## 2019-09-03 DIAGNOSIS — I10 Essential (primary) hypertension: Secondary | ICD-10-CM | POA: Diagnosis not present

## 2019-09-03 DIAGNOSIS — E039 Hypothyroidism, unspecified: Secondary | ICD-10-CM | POA: Diagnosis not present

## 2019-09-03 NOTE — Progress Notes (Signed)
Subjective:  Patient ID: [name], female    DOB: 1950-12-15  Age: 68 y.o. MRN: [id number]  CC: Hypertension   HPI [name] presents for a hosp f/up.  She was recently admitted for elevated blood pressure, nausea, and atypical chest pain.  She ruled out for ischemia.  No specific cause for her symptoms was identified.  She has felt well since discharge and tells me her blood pressure has been well controlled.  Hospital Course:  Principal Problem:   Chest pain Active Problems:   Hypothyroidism   Hyperlipidemia LDL goal <70   Essential hypertension   GERD (gastroesophageal reflux disease)   CAD (coronary artery disease)  Chest pain/hx of CAD  Currently chest pain-free Troponin x2 negative, EKG no acute ischemia LDL 55, A1c 6.1 Echo with EF 70 to 75%, left ventricular diastolic Doppler parameters are consistent with impaired relaxation pattern Cardiology consulted, recommending switching metoprolol to Coreg, statin amlodipine, discontinue inadapamide.  Follow-up closely with cardiology Continue prn Nitroglycerin, plavix and crestor Follow-up with cardiology  Hypothyroidism Continue Synthroid  Hyperlipidemia Continue Crestor  Essential hypertension Continue losartan, start amlodipine, discontinue inadapamide, start Coreg, discontinue metoprolol Follow-up with PCP/cardiology  GERD (gastroesophageal reflux disease): Continue protonix   Outpatient Medications Prior to Visit  Medication Sig Dispense Refill  . amLODipine (NORVASC) 5 MG tablet Take 1 tablet (5 mg total) by mouth daily. 30 tablet 0  . carvedilol (COREG) 6.25 MG tablet Take 1 tablet (6.25 mg total) by mouth 2 (two) times daily with a meal. 60 tablet 0  . clopidogrel (PLAVIX) 75 MG tablet Take 1 tablet (75 mg total) by mouth daily with breakfast. 90 tablet 3  . fluticasone (FLONASE) 50 MCG/ACT nasal spray Place 1 spray into both nostrils daily. (Patient taking differently: Place 1 spray into both  nostrils daily as needed for allergies or rhinitis. ) 16 g 2  . isosorbide mononitrate (IMDUR) 30 MG 24 hr tablet Take 1 tablet (30 mg total) by mouth daily. 90 tablet 3  . levothyroxine (SYNTHROID) 88 MCG tablet TAKE 1 TABLET BY MOUTH EVERY DAY (Patient taking differently: Take 88 mcg by mouth daily before breakfast. ) 90 tablet 1  . linaclotide (LINZESS) 145 MCG CAPS capsule Take 1 capsule (145 mcg total) by mouth daily before breakfast. 90 capsule 1  . losartan (COZAAR) 25 MG tablet Take 1 tablet (25 mg total) by mouth daily. 90 tablet 3  . meclizine (ANTIVERT) 25 MG tablet Take 1 tablet (25 mg total) by mouth 2 (two) times daily as needed for dizziness. 10 tablet 0  . nitroGLYCERIN (NITROSTAT) 0.4 MG SL tablet Place 1 tablet (0.4 mg total) under the tongue every 5 (five) minutes as needed for chest pain. 25 tablet 1  . rosuvastatin (CRESTOR) 10 MG tablet TAKE 1 TABLET BY MOUTH EVERY DAY (Patient taking differently: Take 10 mg by mouth daily. ) 90 tablet 2  . Vitamin D, Ergocalciferol, (DRISDOL) 50000 units CAPS capsule TAKE 1 CAPSULE (50,000 UNITS TOTAL) BY MOUTH EVERY 7 (SEVEN) DAYS. (Patient taking differently: Take 50,000 Units by mouth every Thursday. ) 12 capsule 0   No facility-administered medications prior to visit.     ROS Review of Systems  Constitutional: Negative for diaphoresis and fatigue.  HENT: Negative.  Negative for trouble swallowing.   Eyes: Negative for visual disturbance.  Respiratory: Negative for cough, chest tightness and wheezing.   Cardiovascular: Negative for chest pain, palpitations and leg swelling.  Gastrointestinal: Negative for abdominal pain, diarrhea, nausea and  vomiting.  Endocrine: Negative.   Genitourinary: Negative.  Negative for difficulty urinating.  Musculoskeletal: Negative.  Negative for arthralgias and myalgias.  Skin: Negative.  Negative for pallor.  Neurological: Negative.  Negative for dizziness, weakness and light-headedness.   Hematological: Negative for adenopathy. Does not bruise/bleed easily.  Psychiatric/Behavioral: Negative.     Objective:  BP 138/78 (BP Location: Left Arm, Patient Position: Sitting, Cuff Size: Normal)   Pulse 63   Temp 98.5 F (36.9 C) (Oral)   Resp 16   Ht 5' 7"  (1.702 m)   Wt 184 lb 4 oz (83.6 kg)   SpO2 99%   BMI 28.86 kg/m   BP Readings from Last 3 Encounters:  09/03/19 138/78  08/28/19 110/70  08/17/19 119/70    Wt Readings from Last 3 Encounters:  09/03/19 184 lb 4 oz (83.6 kg)  08/28/19 186 lb (84.4 kg)  08/17/19 185 lb (83.9 kg)    Physical Exam Vitals signs reviewed.  Constitutional:      Appearance: Normal appearance.  HENT:     Nose: Nose normal.     Mouth/Throat:     Mouth: Mucous membranes are moist.     Pharynx: No oropharyngeal exudate.  Eyes:     General: No scleral icterus. Neck:     Musculoskeletal: Normal range of motion and neck supple.  Cardiovascular:     Rate and Rhythm: Normal rate and regular rhythm.     Heart sounds: No murmur.  Pulmonary:     Effort: Pulmonary effort is normal.     Breath sounds: No stridor. No wheezing, rhonchi or rales.  Abdominal:     General: Abdomen is protuberant. Bowel sounds are normal. There is no distension.     Palpations: There is no hepatomegaly or splenomegaly.     Tenderness: There is no abdominal tenderness.  Musculoskeletal: Normal range of motion.     Right lower leg: No edema.     Left lower leg: No edema.  Lymphadenopathy:     Cervical: No cervical adenopathy.  Skin:    General: Skin is warm and dry.  Neurological:     General: No focal deficit present.     Mental Status: She is alert.  Psychiatric:        Mood and Affect: Mood normal.        Behavior: Behavior normal.     Lab Results  Component Value Date   WBC 8.0 08/17/2019   HGB 13.0 08/17/2019   HCT 36.6 08/17/2019   PLT 263 08/17/2019   GLUCOSE 128 (H) 08/17/2019   CHOL 123 08/17/2019   TRIG 103 08/17/2019   HDL 47  08/17/2019   LDLDIRECT 88.0 10/19/2016   LDLCALC 55 08/17/2019   ALT 20 08/13/2019   AST 27 08/13/2019   NA 139 08/17/2019   K 3.8 08/17/2019   CL 107 08/17/2019   CREATININE 0.77 08/17/2019   BUN 8 08/17/2019   CO2 25 08/17/2019   TSH 2.80 08/13/2019   INR 1.01 05/08/2018   HGBA1C 6.0 (H) 08/17/2019    Mm 3d Screen Breast Bilateral  Result Date: 08/30/2019 CLINICAL DATA:  Screening. EXAM: DIGITAL SCREENING BILATERAL MAMMOGRAM WITH TOMO AND CAD COMPARISON:  Previous exam(s). ACR Breast Density Category b: There are scattered areas of fibroglandular density. FINDINGS: There are no findings suspicious for malignancy. Images were processed with CAD. IMPRESSION: No mammographic evidence of malignancy. A result letter of this screening mammogram will be mailed directly to the patient. RECOMMENDATION: Screening mammogram in one  year. (Code:SM-B-01Y) BI-RADS CATEGORY  1: Negative. Electronically Signed   By: Claudie Revering M.D.   On: 08/30/2019 10:02    Assessment & Plan:   Chanci was seen today for hypertension.  Diagnoses and all orders for this visit:  Essential hypertension- Her blood pressure is adequately well controlled.  Chronic idiopathic constipation- She is getting adequate symptom relief with Linzess.  Hypothyroidism, unspecified type- Her recent TSH was in the normal range.  She will remain on the current dose of levothyroxine.  Coronary artery disease involving native coronary artery of native heart with other form of angina pectoris [facility])- She has had no recurrent episodes of angina.  Will continue aggressive risk factor modifications.   I am having [name] maintain her nitroGLYCERIN, fluticasone, Vitamin D (Ergocalciferol), linaclotide, levothyroxine, clopidogrel, isosorbide mononitrate, losartan, rosuvastatin, meclizine, amLODipine, and carvedilol.  No orders of the defined types were placed in this encounter.    Follow-up: Return in about 6 months (around  03/03/2020).  [name], MD

## 2019-09-03 NOTE — Patient Instructions (Signed)

## 2019-09-03 NOTE — Progress Notes (Deleted)
{Choose 1 Note Type (Telehealth Visit or Telephone Visit):[phone]}   Date:  09/03/2019   ID:  [name], DOB 03/13/1951, MRN [id number]  {Patient Location:[phone]::"Home"} {Provider Location:[phone]::"Home"}  PCP:  [name], MD  Cardiologist:  [name], MD *** Electrophysiologist:  None   Evaluation Performed:  {Choose Visit Type:[phone]::"Follow-Up Visit"}  Chief Complaint:  ***  History of Present Illness:    [name] is a 68 y.o. female with ***  Coronary artery disease   T[place]-Tsubo CM   S/p ACS 04/2018 >> small D1 with 70% (too small for PCI)  EF 45-50, ant and apical HK >> stress induced CM  Echocardiogram 10/19: EF 55-60   Echocardiogram 07/2019: EF 70-75  Hypertension   Hyperlipidemia   GERD   Hypothyroidism  She was last seen by me via Telemedicine in 01/2019.  She was admitted 08/17/2019 for chest pain.  HS-Troponin was negative. She was seen by Dr. Sallyanne Kuster for Cardiology and Amlodipine was added to cover for possible vasospasm.  ***  The patient {does/does not:200015} have symptoms concerning for COVID-19 infection (fever, chills, cough, or new shortness of breath).    Past Medical History:  Diagnosis Date  . Anemia   . Arthritis   . Cholelithiasis   . Chronic back pain   . Constipation   . Esophageal reflux   . Fibromyalgia   . Hyperlipidemia   . Hypertension   . Hypothyroid   . IBS (irritable bowel syndrome)   . Internal hemorrhoids without mention of complication   . Left leg pain   . Personal history of colonic polyps 05/06/2010   ADENOMATOUS POLYP   Past Surgical History:  Procedure Laterality Date  . ABDOMINAL HYSTERECTOMY    . CERVICAL DISC SURGERY     PLATE IN NECK & BACK  . CHOLECYSTECTOMY    . COLONOSCOPY  09/27/2011   NORMAL   . LEFT HEART CATH AND CORONARY ANGIOGRAPHY N/A 05/08/2018   Procedure: LEFT HEART CATH AND CORONARY ANGIOGRAPHY;  Surgeon: Belva Crome, MD;  Location: [facility];   Service: Cardiovascular;  Laterality: N/A;  . LUMBAR [place]    . TONSILLECTOMY       No outpatient medications have been marked as taking for the 09/04/19 encounter (Appointment) with Richardson Dopp T, PA-C.     Allergies:   Aspirin, Gadolinium, Iohexol, Livalo [pitavastatin], Hydrocodone-acetaminophen, and Lipitor [atorvastatin]   Social History   Tobacco Use  . Smoking status: Never Smoker  . Smokeless tobacco: Never Used  Substance Use Topics  . Alcohol use: No    Alcohol/week: 0.0 standard drinks  . Drug use: No     Family Hx: The patient's family history includes Breast cancer in her cousin and maternal aunt; COPD in her mother; Dementia in her mother; Diabetes in her mother; Heart failure in her father; Hypertension in her mother; Kidney disease in her mother; Lung disease in her mother. There is no history of Colon cancer.  ROS:   Please see the history of present illness.    *** All other systems reviewed and are negative.   Prior CV studies:   The following studies were reviewed today:  *** Echocardiogram 08/17/2019 EF 70-75, impaired relaxation (Gr 1 DD), normal RVSF  Echo 09/12/18 Mild conc LVH, EF 55-60, no RWMA, Gr 1 DD, trivial TR, PASP 20  Echo 05/10/18 EF 45-50, mild LVH, apical ant and apical HK, Gr 1 DD  Cardiac Catheterization 05/08/18 D2 70, 100 EF 45-50  Labs/Other Tests and Data Reviewed:    EKG:  {EKG/Telemetry Strips [phone]}  Recent Labs: 05/21/2019: Magnesium 1.9 08/13/2019: ALT 20; TSH 2.80 08/17/2019: BUN 8; Creatinine, Ser 0.77; Hemoglobin 13.0; Platelets 263; Potassium 3.8; Sodium 139   Recent Lipid Panel Lab Results  Component Value Date/Time   CHOL 123 08/17/2019 07:47 AM   CHOL 109 06/27/2018 08:12 AM   TRIG 103 08/17/2019 07:47 AM   HDL 47 08/17/2019 07:47 AM   HDL 43 06/27/2018 08:12 AM   CHOLHDL 2.6 08/17/2019 07:47 AM   LDLCALC 55 08/17/2019 07:47 AM   LDLCALC 43 06/27/2018 08:12 AM   LDLDIRECT  88.0 10/19/2016 08:44 AM    Wt Readings from Last 3 Encounters:  09/03/19 184 lb 4 oz (83.6 kg)  08/28/19 186 lb (84.4 kg)  08/17/19 185 lb (83.9 kg)     Objective:    Vital Signs:  There were no vitals taken for this visit.   {[facility] Virtual Exam (Optional):[id number]::"VITAL SIGNS:  reviewed"}  ASSESSMENT & PLAN:    1. *** Takotsubo cardiomyopathy EF was previously 45-50% the time of her presentation.  Most recent echocardiogram in October 2019 demonstrates return of normal LV function.  She is doing well without Sorbide, losartan, metoprolol succinate.  Coronary artery disease involving native coronary artery of native heart without angina pectoris Cardiac catheterization in June 2019 demonstrated 70% stenosis in the second diagonal and probable distal occlusion.  Cardiomyopathy seem to be out of proportion to her coronary artery disease.  She has been managed medically.  She remains on clopidogrel, isosorbide and rosuvastatin.  She is not currently having any symptoms of angina.  Essential hypertension The patient's blood pressure is controlled on her current regimen.  Continue current therapy.   Hyperlipidemia LDL goal <70 LDL optimal on most recent lab work.  Continue current Rx.      COVID-19 Education: The signs and symptoms of COVID-19 were discussed with the patient and how to seek care for testing (follow up with PCP or arrange E-visit).  ***The importance of social distancing was discussed today.  Time:   Today, I have spent *** minutes with the patient with telehealth technology discussing the above problems.     Medication Adjustments/Labs and Tests Ordered: Current medicines are reviewed at length with the patient today.  Concerns regarding medicines are outlined above.   Tests Ordered: No orders of the defined types were placed in this encounter.   Medication Changes: No orders of the defined types were placed in this encounter.   Follow Up:   {F/U Format:[phone]} {follow up:15908}  Signed, Richardson Dopp, PA-C  09/03/2019 5:21 PM    [facility]

## 2019-09-04 ENCOUNTER — Telehealth: Payer: Medicare Other | Admitting: Physician Assistant

## 2019-09-05 ENCOUNTER — Encounter: Payer: Self-pay | Admitting: Gynecology

## 2019-09-06 ENCOUNTER — Other Ambulatory Visit: Payer: Self-pay

## 2019-09-06 ENCOUNTER — Encounter: Payer: Self-pay | Admitting: Cardiology

## 2019-09-06 ENCOUNTER — Ambulatory Visit (INDEPENDENT_AMBULATORY_CARE_PROVIDER_SITE_OTHER): Payer: Medicare Other | Admitting: Cardiology

## 2019-09-06 VITALS — BP 120/70 | HR 76 | Ht 67.0 in | Wt 182.8 lb

## 2019-09-06 DIAGNOSIS — E785 Hyperlipidemia, unspecified: Secondary | ICD-10-CM

## 2019-09-06 DIAGNOSIS — I25118 Atherosclerotic heart disease of native coronary artery with other forms of angina pectoris: Secondary | ICD-10-CM | POA: Diagnosis not present

## 2019-09-06 DIAGNOSIS — I10 Essential (primary) hypertension: Secondary | ICD-10-CM

## 2019-09-06 MED ORDER — NITROGLYCERIN 0.4 MG SL SUBL: 0.4000 mg | SUBLINGUAL_TABLET | SUBLINGUAL | 3 refills | Status: DC | PRN

## 2019-09-06 MED ORDER — AMLODIPINE BESYLATE 5 MG PO TABS: 5.0000 mg | ORAL_TABLET | Freq: Every day | ORAL | 3 refills | Status: DC

## 2019-09-06 MED ORDER — CARVEDILOL 6.25 MG PO TABS: 6.2500 mg | ORAL_TABLET | Freq: Two times a day (BID) | ORAL | 3 refills | Status: DC

## 2019-09-06 NOTE — Progress Notes (Signed)
Cardiology Office Note:    Date:  09/06/2019   ID:  [name], DOB 03/19/1951, MRN [id number]  PCP:  [name], MD  Cardiologist:  [name], MD  Referring MD: [name], MD   Chief Complaint  Patient presents with  . Hospitalization Follow-up  . Chest Pain    History of Present Illness:    [name] is a 68 y.o. female with a past medical history significant for ACS/Takotsubo CM 05/11/2018 w/ D2 70% (small vessel) and EF 45%, HTN, HLD, hypothyroid, GERD, fibromyalgia, NASH, obesity.   She had an ER visit on 9 5 for headache and dizziness associated with poorly controlled hypertension with blood pressure 178/82.  The ER MD suspected possible migraine, or mild dehydration.  She felt better after migraine cocktail and was discharged.  On 9/14 at PCP visit, BP 184/96, indapamide 1.25 mg daily was added.  The patient was then admitted to the hospital 08/17/2019 with complaints of chest pain.  She ruled out for MI by enzymes.  EKG without acute ischemia.  Echocardiogram showed normal LV systolic function, actually hyperdynamic, with EF 70-75%.  There was elevated LV end-diastolic pressure.  No LVH.  Her blood pressure continue to be elevated.  Metoprolol was switched to carvedilol and indapamide was discontinued in favor of amlodipine for its anti-vasospastic action.  Ms. [name] is here today for hospital follow-up. She says that she had awoken during the night with nausea and chest pain that prompted her to go to the hospital. She cannot describe the pain. She called 911. BP was elevated 190/104. No shortness of breath.   Today she reports that she has had no further chest pain.  She denies shortness of breath, orthopnea, PND, lightheadedness or syncope.  She has very slight ankle edema, does not bother her.   She does light housework. She has had no chest pain since the hosptial.   Cardiac studies   Echocardiogram 08/17/2019 IMPRESSIONS    1. Left ventricular  ejection fraction, by visual estimation, is 70 to 75%. The left ventricle has hyperdynamic function. Normal left ventricular size. There is no left ventricular hypertrophy.  2. Elevated left ventricular end-diastolic pressure.  3. Left ventricular diastolic Doppler parameters are consistent with impaired relaxation pattern of LV diastolic filling.  4. Global right ventricle has normal systolic function.The right ventricular size is normal. No increase in right ventricular wall thickness.  5. Left atrial size was normal.  6. Right atrial size was normal.  7. The mitral valve is normal in structure. No evidence of mitral valve regurgitation. No evidence of mitral stenosis.  8. The tricuspid valve is normal in structure. Tricuspid valve regurgitation was not visualized by color flow Doppler.  9. The aortic valve is normal in structure. Aortic valve regurgitation was not visualized by color flow Doppler. Structurally normal aortic valve, with no evidence of sclerosis or stenosis. 10. The pulmonic valve was normal in structure. Pulmonic valve regurgitation is not visualized by color flow Doppler. 11. The inferior vena cava is normal in size with greater than 50% respiratory variability, suggesting right atrial pressure of 3 mmHg.  ECHO: 09/12/2018 - Left ventricle: The cavity size was normal. There was mild concentric hypertrophy. Systolic function was normal. The estimated ejection fraction was in the range of 55% to 60%. Wall motion was normal; there were no regional wall motion abnormalities. Doppler parameters are consistent with indetermiante left ventricular relaxation (grade 1 diastolic dysfunction). Doppler parameters are consistent with indeterminate  ventricular filling pressure. - Aortic valve: Transvalvular velocity was within the normal range. There was no stenosis. There was no regurgitation. - Mitral valve: Transvalvular velocity was within the normal range. There  was no evidence for stenosis. There was no regurgitation. - Right ventricle: The cavity size was normal. Wall thickness was normal. Systolic function was normal. - Atrial septum: No defect or patent foramen ovale was identified by color flow Doppler. - Tricuspid valve: There was trivial regurgitation. - Pulmonary arteries: Systolic pressure was within the normal range. PA peak pressure: 20 mm Hg (S). - Global longitudinal strain -20.9% (normal).  CATH: Urgent left cardiac catheterization6/10/19 Conclusion    Acute coronary syndrome with anterior wall motion abnormality and essentially widely patent coronary arteries. Suspect variant stress cardiomyopathy. (Cannot totally exclude transient occlusion and recanalization of the first diagonal which is relatively small and has moderate somewhat hazy disease in the mid segment up to 70%.)  Normal left main  LAD is large, wraps around the left ventricular apex, and gives origin to 3 diagonal branches, the second of which is discussed above with mid vessel 70% narrowing in an artery that is less than 2 mm in diameter. Beyond that there is either a small branch or occlusion of the branch. Images are ambiguous.  Circumflex is large, first marginal and second marginal widely patent. Second marginal bifurcates.  Right coronary is tortuous but widely patent.  Abnormal left ventricular wall motion with mid to distal anterior wall akinesis, EF 45%, and elevated LVEDP consistent with acute combined systolic and diastolic heart failure.  RECOMMENDATIONS:   Risk factor modification: Blood pressure 130/80 mmHg or less, LDL cholesterol less than 70, check A1c, and encourage exercise when appropriate.  Add low-dose beta-blocker  IV nitroglycerin and IV heparin x24 hours then wean.  2D Doppler echocardiogram tomorrow.  Diagnostic Dominance: Right    ECHO6/12/19 Study Conclusions - Left ventricle: The cavity size was normal.  There was mild concentric hypertrophy. Systolic function was mildly reduced. The estimated ejection fraction was in the range of 45% to 50%. Hypokinesis of the apicalanterior and apical myocardium; consistent with ischemia in the distribution of the left anterior descending coronary artery. Doppler parameters are consistent with abnormal left ventricular relaxation (grade 1 diastolic dysfunction).    Past Medical History:  Diagnosis Date  . Anemia   . Arthritis   . Cholelithiasis   . Chronic back pain   . Constipation   . Esophageal reflux   . Fibromyalgia   . Hyperlipidemia   . Hypertension   . Hypothyroid   . IBS (irritable bowel syndrome)   . Internal hemorrhoids without mention of complication   . Left leg pain   . Personal history of colonic polyps 05/06/2010   ADENOMATOUS POLYP    Past Surgical History:  Procedure Laterality Date  . ABDOMINAL HYSTERECTOMY    . CERVICAL DISC SURGERY     PLATE IN NECK & BACK  . CHOLECYSTECTOMY    . COLONOSCOPY  09/27/2011   NORMAL   . LEFT HEART CATH AND CORONARY ANGIOGRAPHY N/A 05/08/2018   Procedure: LEFT HEART CATH AND CORONARY ANGIOGRAPHY;  Surgeon: Belva Crome, MD;  Location: [facility];  Service: Cardiovascular;  Laterality: N/A;  . LUMBAR Slope    . TONSILLECTOMY      Current Medications: Current Meds  Medication Sig  . amLODipine (NORVASC) 5 MG tablet Take 1 tablet (5 mg total) by mouth daily.  . carvedilol (COREG) 6.25 MG tablet Take 1  tablet (6.25 mg total) by mouth 2 (two) times daily with a meal.  . clopidogrel (PLAVIX) 75 MG tablet Take 1 tablet (75 mg total) by mouth daily with breakfast.  . fluticasone (FLONASE) 50 MCG/ACT nasal spray Place 1 spray into both nostrils daily.  . isosorbide mononitrate (IMDUR) 30 MG 24 hr tablet Take 1 tablet (30 mg total) by mouth daily.  Marland Kitchen levothyroxine (SYNTHROID) 88 MCG tablet TAKE 1 TABLET BY MOUTH EVERY DAY  . linaclotide (LINZESS) 145 MCG CAPS  capsule Take 1 capsule (145 mcg total) by mouth daily before breakfast.  . losartan (COZAAR) 25 MG tablet Take 1 tablet (25 mg total) by mouth daily.  . meclizine (ANTIVERT) 25 MG tablet Take 1 tablet (25 mg total) by mouth 2 (two) times daily as needed for dizziness.  . nitroGLYCERIN (NITROSTAT) 0.4 MG SL tablet Place 1 tablet (0.4 mg total) under the tongue every 5 (five) minutes as needed for chest pain.  . rosuvastatin (CRESTOR) 10 MG tablet TAKE 1 TABLET BY MOUTH EVERY DAY  . Vitamin D, Ergocalciferol, (DRISDOL) 50000 units CAPS capsule TAKE 1 CAPSULE (50,000 UNITS TOTAL) BY MOUTH EVERY 7 (SEVEN) DAYS.  . [DISCONTINUED] amLODipine (NORVASC) 5 MG tablet Take 1 tablet (5 mg total) by mouth daily.  . [DISCONTINUED] carvedilol (COREG) 6.25 MG tablet Take 1 tablet (6.25 mg total) by mouth 2 (two) times daily with a meal.  . [DISCONTINUED] nitroGLYCERIN (NITROSTAT) 0.4 MG SL tablet Place 1 tablet (0.4 mg total) under the tongue every 5 (five) minutes as needed for chest pain.     Allergies:   Aspirin, Gadolinium, Iohexol, Livalo [pitavastatin], Hydrocodone-acetaminophen, and Lipitor [atorvastatin]   Social History   Socioeconomic History  . Marital status: Widowed    Spouse name: Not on file  . Number of children: 2  . Years of education: Not on file  . Highest education level: Not on file  Occupational History  . Occupation: housewife    Employer: UNEMPLOYED   Social Needs  . Financial resource strain: Not hard at all  . Food insecurity    Worry: Never true    Inability: Never true  . Transportation needs    Medical: No    Non-medical: No  Tobacco Use  . Smoking status: Never Smoker  . Smokeless tobacco: Never Used  Substance and Sexual Activity  . Alcohol use: No    Alcohol/week: 0.0 standard drinks  . Drug use: No  . Sexual activity: Not Currently  Lifestyle  . Physical activity    Days per week: 0 days    Minutes per session: 0 min  . Stress: Not at all  Relationships   . Social connections    Talks on phone: More than three times a week    Gets together: More than three times a week    Attends religious service: More than 4 times per year    Active member of club or organization: Yes    Attends meetings of clubs or organizations: More than 4 times per year    Relationship status: Married  Other Topics Concern  . Not on file  Social History Narrative   Daily Caffeine Use     Family History: The patient's family history includes Breast cancer in her cousin and maternal aunt; COPD in her mother; Dementia in her mother; Diabetes in her mother; Heart failure in her father; Hypertension in her mother; Kidney disease in her mother; Lung disease in her mother. There is no history of Colon cancer.  ROS:   Please see the history of present illness.     All other systems reviewed and are negative.   EKG:  EKG is not ordered today.   Recent Labs: 05/21/2019: Magnesium 1.9 08/13/2019: ALT 20; TSH 2.80 08/17/2019: BUN 8; Creatinine, Ser 0.77; Hemoglobin 13.0; Platelets 263; Potassium 3.8; Sodium 139   Recent Lipid Panel    Component Value Date/Time   CHOL 123 08/17/2019 0747   CHOL 109 06/27/2018 0812   TRIG 103 08/17/2019 0747   HDL 47 08/17/2019 0747   HDL 43 06/27/2018 0812   CHOLHDL 2.6 08/17/2019 0747   VLDL 21 08/17/2019 0747   LDLCALC 55 08/17/2019 0747   LDLCALC 43 06/27/2018 0812   LDLDIRECT 88.0 10/19/2016 0844    Physical Exam:    VS:  BP 120/70   Pulse 76   Ht 5' 7"  (1.702 m)   Wt 182 lb 12.8 oz (82.9 kg)   SpO2 97%   BMI 28.63 kg/m     Wt Readings from Last 6 Encounters:  09/06/19 182 lb 12.8 oz (82.9 kg)  09/03/19 184 lb 4 oz (83.6 kg)  08/28/19 186 lb (84.4 kg)  08/17/19 185 lb (83.9 kg)  08/13/19 185 lb (83.9 kg)  05/22/19 189 lb (85.7 kg)     Physical Exam  Constitutional: She is oriented to person, place, and time. She appears well-developed and well-nourished. No distress.  HENT:  Head: Normocephalic and atraumatic.   Neck: Normal range of motion. Neck supple. No JVD present.  Cardiovascular: Normal rate, regular rhythm, normal heart sounds and intact distal pulses. Exam reveals no gallop and no friction rub.  No murmur heard. Pulmonary/Chest: Effort normal and breath sounds normal. No respiratory distress. She has no wheezes. She has no rales.  Abdominal: Soft. Bowel sounds are normal.  Musculoskeletal: Normal range of motion.     Comments: Trace ankle edema  Neurological: She is alert and oriented to person, place, and time.  Skin: Skin is warm and dry.  Psychiatric: She has a normal mood and affect. Her behavior is normal. Judgment and thought content normal.  Vitals reviewed.    ASSESSMENT:    1. Coronary artery disease of native artery of native heart with stable angina pectoris ([place])   2. Essential (primary) hypertension   3. Hyperlipidemia LDL goal <70    PLAN:    In order of problems listed above:  CAD -History of small D2 mid 70% and occluded distally by cath 04/2018, medical therapy only option since less than 2 mm diameter -Seen at the hospital 08/17/2019 for chest pain.  Echocardiogram with no wall motion abnormalities, normal EF.  Ruled out for MI by EKG and enzymes.  Medication changes for better blood pressure control and amlodipine for possible coronary vasospasm. -Continues on Imdur, Plavix, beta-blocker, statin -No chest pain since the hospital.  -Advised to add some walking for exercise to improve vascular tone and cardiac health.   Hypertension -Recently has had elevated blood pressures.  Medication changes over the last month with the last time at hospitalization on 08/17/2019 with switching from metoprolol to carvedilol.  Stopping indapamide and starting amlodipine.  Continue losartan 25 mg -BP is very good today.  -Has trace ankle edema possibly related to amlodipine, but this does not bother the patient, will not change therapy.  Hyperlipidemia -On rosuvastatin 10 mg  daily.  LDL is 55, at goal of less than 70.  Continue current therapy.   Medication Adjustments/Labs and Tests Ordered: Current medicines  are reviewed at length with the patient today.  Concerns regarding medicines are outlined above. Labs and tests ordered and medication changes are outlined in the patient instructions below:  Patient Instructions  Medication Instructions:  Your physician recommends that you continue on your current medications as directed. Please refer to the Current Medication list given to you today.  If you need a refill on your cardiac medications before your next appointment, please call your pharmacy.   Lab work: None  If you have labs (blood work) drawn today and your tests are completely normal, you will receive your results only by: Marland Kitchen MyChart Message (if you have MyChart) OR . A paper copy in the mail If you have any lab test that is abnormal or we need to change your treatment, we will call you to review the results.  Testing/Procedures: None   Follow-Up: At [facility], you and your health needs are our priority.  As part of our continuing mission to provide you with exceptional heart care, we have created designated Provider Care Teams.  These Care Teams include your primary Cardiologist (physician) and Advanced Practice Providers (APPs -  Physician Assistants and Nurse Practitioners) who all work together to provide you with the care you need, when you need it. You will need a follow up appointment in:  6 months.  Please call our office 2 months in advance to schedule this appointment.  You may see [name], MD or one of the following Advanced Practice Providers on your designated Care Team: Richardson Dopp, PA-C [place], [place] . Daune Perch, NP  Any Other Special Instructions Will Be Listed Below (If Applicable).  \Lifestyle Modifications to Prevent and Treat Heart Disease -Recommend heart healthy/Mediterranean diet, with whole grains,  fruits, vegetables, fish, lean meats, nuts, olive oil and avocado oil.  -Limit salt intake to less than 2000 mg per day.  -Recommend moderate walking, starting slowly with a few minutes and working up to 3-5 times/week for 30-50 minutes each session. Aim for at least 150 minutes.week. Goal should be pace of 3 miles/hours, or walking 1.5 miles in 30 minutes -Recommend avoidance of tobacco products. Avoid excess alcohol. -Keep blood pressure well controlled, ideally less than 130/80.   ----------------------------------------  DASH Eating Plan DASH stands for "Dietary Approaches to Stop Hypertension." The DASH eating plan is a healthy eating plan that has been shown to reduce high blood pressure (hypertension). It may also reduce your risk for type 2 diabetes, heart disease, and stroke. The DASH eating plan may also help with weight loss. What are tips for following this plan?  General guidelines  Avoid eating more than 2,300 mg (milligrams) of salt (sodium) a day. If you have hypertension, you may need to reduce your sodium intake to 1,500 mg a day.  Limit alcohol intake to no more than 1 drink a day for nonpregnant women and 2 drinks a day for men. One drink equals 12 oz of beer, 5 oz of wine, or 1 oz of hard liquor.  Work with your health care provider to maintain a healthy body weight or to lose weight. Ask what an ideal weight is for you.  Get at least 30 minutes of exercise that causes your heart to beat faster (aerobic exercise) most days of the week. Activities may include walking, swimming, or biking.  Work with your health care provider or diet and nutrition specialist (dietitian) to adjust your eating plan to your individual calorie needs. Reading food labels   Check  food labels for the amount of sodium per serving. Choose foods with less than 5 percent of the Daily Value of sodium. Generally, foods with less than 300 mg of sodium per serving fit into this eating plan.  To find  whole grains, look for the word "whole" as the first word in the ingredient list. Shopping  Buy products labeled as "low-sodium" or "no salt added."  Buy fresh foods. Avoid canned foods and premade or frozen meals. Cooking  Avoid adding salt when cooking. Use salt-free seasonings or herbs instead of table salt or sea salt. Check with your health care provider or pharmacist before using salt substitutes.  Do not fry foods. Cook foods using healthy methods such as baking, boiling, grilling, and broiling instead.  Cook with heart-healthy oils, such as olive, canola, soybean, or sunflower oil. Meal planning  Eat a balanced diet that includes: ? 5 or more servings of fruits and vegetables each day. At each meal, try to fill half of your plate with fruits and vegetables. ? Up to 6-8 servings of whole grains each day. ? Less than 6 oz of lean meat, poultry, or fish each day. A 3-oz serving of meat is about the same size as a deck of cards. One egg equals 1 oz. ? 2 servings of low-fat dairy each day. ? A serving of nuts, seeds, or beans 5 times each week. ? Heart-healthy fats. Healthy fats called Omega-3 fatty acids are found in foods such as flaxseeds and coldwater fish, like sardines, salmon, and mackerel.  Limit how much you eat of the following: ? Canned or prepackaged foods. ? Food that is high in trans fat, such as fried foods. ? Food that is high in saturated fat, such as fatty meat. ? Sweets, desserts, sugary drinks, and other foods with added sugar. ? Full-fat dairy products.  Do not salt foods before eating.  Try to eat at least 2 vegetarian meals each week.  Eat more home-cooked food and less restaurant, buffet, and fast food.  When eating at a restaurant, ask that your food be prepared with less salt or no salt, if possible. What foods are recommended? The items listed may not be a complete list. Talk with your dietitian about what dietary choices are best for you. Grains  Whole-grain or whole-wheat bread. Whole-grain or whole-wheat pasta. Brown rice. [place]. Bulgur. Whole-grain and low-sodium cereals. Pita bread. Low-fat, low-sodium crackers. Whole-wheat flour tortillas. Vegetables Fresh or frozen vegetables (raw, steamed, roasted, or grilled). Low-sodium or reduced-sodium tomato and vegetable juice. Low-sodium or reduced-sodium tomato sauce and tomato paste. Low-sodium or reduced-sodium canned vegetables. Fruits All fresh, dried, or frozen fruit. Canned fruit in natural juice (without added sugar). Meat and other protein foods Skinless chicken or [place]. Ground chicken or [place]. Pork with fat trimmed off. Fish and seafood. Egg whites. Dried beans, peas, or lentils. Unsalted nuts, nut butters, and seeds. Unsalted canned beans. Lean cuts of beef with fat trimmed off. Low-sodium, lean deli meat. Dairy Low-fat (1%) or fat-free (skim) milk. Fat-free, low-fat, or reduced-fat cheeses. Nonfat, low-sodium ricotta or cottage cheese. Low-fat or nonfat yogurt. Low-fat, low-sodium cheese. Fats and oils Soft margarine without trans fats. Vegetable oil. Low-fat, reduced-fat, or light mayonnaise and salad dressings (reduced-sodium). Canola, safflower, olive, soybean, and sunflower oils. Avocado. Seasoning and other foods Herbs. Spices. Seasoning mixes without salt. Unsalted popcorn and pretzels. Fat-free sweets. What foods are not recommended? The items listed may not be a complete list. Talk with your dietitian about what  dietary choices are best for you. Grains Baked goods made with fat, such as croissants, muffins, or some breads. Dry pasta or rice meal packs. Vegetables Creamed or fried vegetables. Vegetables in a cheese sauce. Regular canned vegetables (not low-sodium or reduced-sodium). Regular canned tomato sauce and paste (not low-sodium or reduced-sodium). Regular tomato and vegetable juice (not low-sodium or reduced-sodium). Angie Fava. Olives. Fruits Canned  fruit in a light or heavy syrup. Fried fruit. Fruit in cream or butter sauce. Meat and other protein foods Fatty cuts of meat. Ribs. Fried meat. Berniece Salines. Sausage. Bologna and other processed lunch meats. Salami. Fatback. Hotdogs. Bratwurst. Salted nuts and seeds. Canned beans with added salt. Canned or smoked fish. Whole eggs or egg yolks. Chicken or [place] with skin. Dairy Whole or 2% milk, cream, and half-and-half. Whole or full-fat cream cheese. Whole-fat or sweetened yogurt. Full-fat cheese. Nondairy creamers. Whipped toppings. Processed cheese and cheese spreads. Fats and oils Butter. Stick margarine. Lard. Shortening. Ghee. Bacon fat. Tropical oils, such as coconut, palm kernel, or palm oil. Seasoning and other foods Salted popcorn and pretzels. Onion salt, garlic salt, seasoned salt, table salt, and sea salt. Worcestershire sauce. Tartar sauce. Barbecue sauce. Teriyaki sauce. Soy sauce, including reduced-sodium. Steak sauce. Canned and packaged gravies. Fish sauce. Oyster sauce. Cocktail sauce. Horseradish that you find on the shelf. Ketchup. Mustard. Meat flavorings and tenderizers. Bouillon cubes. Hot sauce and Tabasco sauce. Premade or packaged marinades. Premade or packaged taco seasonings. Relishes. Regular salad dressings. Where to find more information:  National Heart, Lung, and [place]: https://wilson-eaton.com/  American Heart Association: www.heart.org Summary  The DASH eating plan is a healthy eating plan that has been shown to reduce high blood pressure (hypertension). It may also reduce your risk for type 2 diabetes, heart disease, and stroke.  With the DASH eating plan, you should limit salt (sodium) intake to 2,300 mg a day. If you have hypertension, you may need to reduce your sodium intake to 1,500 mg a day.  When on the DASH eating plan, aim to eat more fresh fruits and vegetables, whole grains, lean proteins, low-fat dairy, and heart-healthy fats.  Work with your health  care provider or diet and nutrition specialist (dietitian) to adjust your eating plan to your individual calorie needs. This information is not intended to replace advice given to you by your health care provider. Make sure you discuss any questions you have with your health care provider. Document Released: 11/04/2011 Document Revised: 10/28/2017 Document Reviewed: 11/08/2016 Elsevier Patient Education  2020 [place], Daune Perch, NP  09/07/2019 4:41 AM    [place]

## 2019-09-06 NOTE — Patient Instructions (Addendum)
Medication Instructions:  Your physician recommends that you continue on your current medications as directed. Please refer to the Current Medication list given to you today.  If you need a refill on your cardiac medications before your next appointment, please call your pharmacy.   Lab work: None  If you have labs (blood work) drawn today and your tests are completely normal, you will receive your results only by: Marland Kitchen MyChart Message (if you have MyChart) OR . A paper copy in the mail If you have any lab test that is abnormal or we need to change your treatment, we will call you to review the results.  Testing/Procedures: None   Follow-Up: At [facility], you and your health needs are our priority.  As part of our continuing mission to provide you with exceptional heart care, we have created designated Provider Care Teams.  These Care Teams include your primary Cardiologist (physician) and Advanced Practice Providers (APPs -  Physician Assistants and Nurse Practitioners) who all work together to provide you with the care you need, when you need it. You will need a follow up appointment in:  6 months.  Please call our office 2 months in advance to schedule this appointment.  You may see [name], MD or one of the following Advanced Practice Providers on your designated Care Team: Richardson Dopp, PA-C [place], [place] . Daune Perch, NP  Any Other Special Instructions Will Be Listed Below (If Applicable).  \Lifestyle Modifications to Prevent and Treat Heart Disease -Recommend heart healthy/Mediterranean diet, with whole grains, fruits, vegetables, fish, lean meats, nuts, olive oil and avocado oil.  -Limit salt intake to less than 2000 mg per day.  -Recommend moderate walking, starting slowly with a few minutes and working up to 3-5 times/week for 30-50 minutes each session. Aim for at least 150 minutes.week. Goal should be pace of 3 miles/hours, or walking 1.5 miles in 30 minutes  -Recommend avoidance of tobacco products. Avoid excess alcohol. -Keep blood pressure well controlled, ideally less than 130/80.   ----------------------------------------  DASH Eating Plan DASH stands for "Dietary Approaches to Stop Hypertension." The DASH eating plan is a healthy eating plan that has been shown to reduce high blood pressure (hypertension). It may also reduce your risk for type 2 diabetes, heart disease, and stroke. The DASH eating plan may also help with weight loss. What are tips for following this plan?  General guidelines  Avoid eating more than 2,300 mg (milligrams) of salt (sodium) a day. If you have hypertension, you may need to reduce your sodium intake to 1,500 mg a day.  Limit alcohol intake to no more than 1 drink a day for nonpregnant women and 2 drinks a day for men. One drink equals 12 oz of beer, 5 oz of wine, or 1 oz of hard liquor.  Work with your health care provider to maintain a healthy body weight or to lose weight. Ask what an ideal weight is for you.  Get at least 30 minutes of exercise that causes your heart to beat faster (aerobic exercise) most days of the week. Activities may include walking, swimming, or biking.  Work with your health care provider or diet and nutrition specialist (dietitian) to adjust your eating plan to your individual calorie needs. Reading food labels   Check food labels for the amount of sodium per serving. Choose foods with less than 5 percent of the Daily Value of sodium. Generally, foods with less than 300 mg of sodium per serving  fit into this eating plan.  To find whole grains, look for the word "whole" as the first word in the ingredient list. Shopping  Buy products labeled as "low-sodium" or "no salt added."  Buy fresh foods. Avoid canned foods and premade or frozen meals. Cooking  Avoid adding salt when cooking. Use salt-free seasonings or herbs instead of table salt or sea salt. Check with your health care  provider or pharmacist before using salt substitutes.  Do not fry foods. Cook foods using healthy methods such as baking, boiling, grilling, and broiling instead.  Cook with heart-healthy oils, such as olive, canola, soybean, or sunflower oil. Meal planning  Eat a balanced diet that includes: ? 5 or more servings of fruits and vegetables each day. At each meal, try to fill half of your plate with fruits and vegetables. ? Up to 6-8 servings of whole grains each day. ? Less than 6 oz of lean meat, poultry, or fish each day. A 3-oz serving of meat is about the same size as a deck of cards. One egg equals 1 oz. ? 2 servings of low-fat dairy each day. ? A serving of nuts, seeds, or beans 5 times each week. ? Heart-healthy fats. Healthy fats called Omega-3 fatty acids are found in foods such as flaxseeds and coldwater fish, like sardines, salmon, and mackerel.  Limit how much you eat of the following: ? Canned or prepackaged foods. ? Food that is high in trans fat, such as fried foods. ? Food that is high in saturated fat, such as fatty meat. ? Sweets, desserts, sugary drinks, and other foods with added sugar. ? Full-fat dairy products.  Do not salt foods before eating.  Try to eat at least 2 vegetarian meals each week.  Eat more home-cooked food and less restaurant, buffet, and fast food.  When eating at a restaurant, ask that your food be prepared with less salt or no salt, if possible. What foods are recommended? The items listed may not be a complete list. Talk with your dietitian about what dietary choices are best for you. Grains Whole-grain or whole-wheat bread. Whole-grain or whole-wheat pasta. Brown rice. [place]ena Morrow. Bulgur. Whole-grain and low-sodium cereals. Pita bread. Low-fat, low-sodium crackers. Whole-wheat flour tortillas. Vegetables Fresh or frozen vegetables (raw, steamed, roasted, or grilled). Low-sodium or reduced-sodium tomato and vegetable juice. Low-sodium or  reduced-sodium tomato sauce and tomato paste. Low-sodium or reduced-sodium canned vegetables. Fruits All fresh, dried, or frozen fruit. Canned fruit in natural juice (without added sugar). Meat and other protein foods Skinless chicken or [place]. Ground chicken or [place]. Pork with fat trimmed off. Fish and seafood. Egg whites. Dried beans, peas, or lentils. Unsalted nuts, nut butters, and seeds. Unsalted canned beans. Lean cuts of beef with fat trimmed off. Low-sodium, lean deli meat. Dairy Low-fat (1%) or fat-free (skim) milk. Fat-free, low-fat, or reduced-fat cheeses. Nonfat, low-sodium ricotta or cottage cheese. Low-fat or nonfat yogurt. Low-fat, low-sodium cheese. Fats and oils Soft margarine without trans fats. Vegetable oil. Low-fat, reduced-fat, or light mayonnaise and salad dressings (reduced-sodium). Canola, safflower, olive, soybean, and sunflower oils. Avocado. Seasoning and other foods Herbs. Spices. Seasoning mixes without salt. Unsalted popcorn and pretzels. Fat-free sweets. What foods are not recommended? The items listed may not be a complete list. Talk with your dietitian about what dietary choices are best for you. Grains Baked goods made with fat, such as croissants, muffins, or some breads. Dry pasta or rice meal packs. Vegetables Creamed or fried vegetables. Vegetables in a  cheese sauce. Regular canned vegetables (not low-sodium or reduced-sodium). Regular canned tomato sauce and paste (not low-sodium or reduced-sodium). Regular tomato and vegetable juice (not low-sodium or reduced-sodium). Angie Fava. Olives. Fruits Canned fruit in a light or heavy syrup. Fried fruit. Fruit in cream or butter sauce. Meat and other protein foods Fatty cuts of meat. Ribs. Fried meat. Berniece Salines. Sausage. Bologna and other processed lunch meats. Salami. Fatback. Hotdogs. Bratwurst. Salted nuts and seeds. Canned beans with added salt. Canned or smoked fish. Whole eggs or egg yolks. Chicken or [place]  with skin. Dairy Whole or 2% milk, cream, and half-and-half. Whole or full-fat cream cheese. Whole-fat or sweetened yogurt. Full-fat cheese. Nondairy creamers. Whipped toppings. Processed cheese and cheese spreads. Fats and oils Butter. Stick margarine. Lard. Shortening. Ghee. Bacon fat. Tropical oils, such as coconut, palm kernel, or palm oil. Seasoning and other foods Salted popcorn and pretzels. Onion salt, garlic salt, seasoned salt, table salt, and sea salt. Worcestershire sauce. Tartar sauce. Barbecue sauce. Teriyaki sauce. Soy sauce, including reduced-sodium. Steak sauce. Canned and packaged gravies. Fish sauce. Oyster sauce. Cocktail sauce. Horseradish that you find on the shelf. Ketchup. Mustard. Meat flavorings and tenderizers. Bouillon cubes. Hot sauce and Tabasco sauce. Premade or packaged marinades. Premade or packaged taco seasonings. Relishes. Regular salad dressings. Where to find more information:  National Heart, Lung, and [place]: https://wilson-eaton.com/  American Heart Association: www.heart.org Summary  The DASH eating plan is a healthy eating plan that has been shown to reduce high blood pressure (hypertension). It may also reduce your risk for type 2 diabetes, heart disease, and stroke.  With the DASH eating plan, you should limit salt (sodium) intake to 2,300 mg a day. If you have hypertension, you may need to reduce your sodium intake to 1,500 mg a day.  When on the DASH eating plan, aim to eat more fresh fruits and vegetables, whole grains, lean proteins, low-fat dairy, and heart-healthy fats.  Work with your health care provider or diet and nutrition specialist (dietitian) to adjust your eating plan to your individual calorie needs. This information is not intended to replace advice given to you by your health care provider. Make sure you discuss any questions you have with your health care provider. Document Released: 11/04/2011 Document Revised: 10/28/2017  Document Reviewed: 11/08/2016 Elsevier Patient Education  2020 [employer].

## 2019-09-07 ENCOUNTER — Other Ambulatory Visit: Payer: Self-pay

## 2019-09-07 NOTE — Patient Outreach (Signed)
[facility]) Care Management  09/07/2019  [name] 1951/11/03 [id number]   Medication Adherence call to [name] [facility] Ident[facility] Verify spoke with patient she is past due on Pioglitazone 15 mg patient explain she is no longer taking this medication. [name] is showing past due under [facility].   [facility] Direct Dial [phone]  Fax [phone] [email or web address]

## 2019-09-29 ENCOUNTER — Other Ambulatory Visit: Payer: Self-pay | Admitting: Internal Medicine

## 2019-09-29 DIAGNOSIS — E039 Hypothyroidism, unspecified: Secondary | ICD-10-CM

## 2019-10-01 ENCOUNTER — Other Ambulatory Visit: Payer: Self-pay | Admitting: Internal Medicine

## 2019-11-15 ENCOUNTER — Other Ambulatory Visit: Payer: Self-pay | Admitting: Internal Medicine

## 2019-11-15 DIAGNOSIS — I10 Essential (primary) hypertension: Secondary | ICD-10-CM

## 2019-11-19 NOTE — Progress Notes (Signed)
[name] [place], [place] [address] Phone: [phone] Subjective:   Fontaine No, am serving as a scribe for Dr. Hulan Saas.  I'm seeing this patient by the request  of:    CC: Bilateral knee pain follow-up  SHN:[id number]   08/28/2019 Repeat injection given today.  Tolerated the procedure well.  Discussed icing regimen and home exercises, discussed respiratory syndrome.  Board.  Patient is to increase activity as tolerated.  We discussed the possibility of viscosupplementation if not improvement.  Otherwise follow-up again in 3 months  Update 11/20/2019 [name] is a 68 y.o. female coming in with complaint of left knee pain. Patient continues to have knee pain.  Left-sided only.  Started hurting over the course last several weeks.  Nothing that seems to be too much instability at the moment.  Discussed icing regimen which she has been doing minorly.     Past Medical History:  Diagnosis Date  . Anemia   . Arthritis   . Cholelithiasis   . Chronic back pain   . Constipation   . Esophageal reflux   . Fibromyalgia   . Hyperlipidemia   . Hypertension   . Hypothyroid   . IBS (irritable bowel syndrome)   . Internal hemorrhoids without mention of complication   . Left leg pain   . Personal history of colonic polyps 05/06/2010   ADENOMATOUS POLYP   Past Surgical History:  Procedure Laterality Date  . ABDOMINAL HYSTERECTOMY    . CERVICAL DISC SURGERY     PLATE IN NECK & BACK  . CHOLECYSTECTOMY    . COLONOSCOPY  09/27/2011   NORMAL   . LEFT HEART CATH AND CORONARY ANGIOGRAPHY N/A 05/08/2018   Procedure: LEFT HEART CATH AND CORONARY ANGIOGRAPHY;  Surgeon: Belva Crome, MD;  Location: [facility];  Service: Cardiovascular;  Laterality: N/A;  . LUMBAR [place]    . TONSILLECTOMY     Social History   Socioeconomic History  . Marital status: Widowed    Spouse name: Not on file  . Number of children: 2  . Years  of education: Not on file  . Highest education level: Not on file  Occupational History  . Occupation: housewife    Employer: UNEMPLOYED   Tobacco Use  . Smoking status: Never Smoker  . Smokeless tobacco: Never Used  Substance and Sexual Activity  . Alcohol use: No    Alcohol/week: 0.0 standard drinks  . Drug use: No  . Sexual activity: Not Currently  Other Topics Concern  . Not on file  Social History Narrative   Daily Caffeine Use   Social Determinants of Health   Financial Resource Strain:   . Difficulty of Paying Living Expenses: Not on file  Food Insecurity:   . Worried About Charity fundraiser in the Last Year: Not on file  . Ran Out of Food in the Last Year: Not on file  Transportation Needs:   . Lack of Transportation (Medical): Not on file  . Lack of Transportation (Non-Medical): Not on file  Physical Activity: Inactive  . Days of Exercise per Week: 0 days  . Minutes of Exercise per Session: 0 min  Stress:   . Feeling of Stress : Not on file  Social Connections:   . Frequency of Communication with Friends and Family: Not on file  . Frequency of Social Gatherings with Friends and Family: Not on file  . Attends Religious Services:  Not on file  . Active Member of Clubs or Organizations: Not on file  . Attends Archivist Meetings: Not on file  . Marital Status: Not on file   Allergies  Allergen Reactions  . Aspirin Other (See Comments)    unknown  . Gadolinium Hives    patient unsure of which kind of dye she had a reaction to until reaction to Magnevist - may be allergic to x-ray contrast, Onset Date: [id number]   . Iohexol Hives    patient unsure of which kind of dye she had a reaction to until reaction to Magnevist - may be allergic to x-ray contrast, Onset Date: [id number]  . Livalo [Pitavastatin] Other (See Comments)    myalgias  . Hydrocodone-Acetaminophen Other (See Comments)  . Lipitor [Atorvastatin] Other (See Comments)    Myalgias   Family  History  Problem Relation Age of Onset  . Diabetes Mother        aunts and uncles  . Kidney disease Mother        stage 3  . Lung disease Mother        colapsed lung/in hospice  . COPD Mother   . Hypertension Mother   . Dementia Mother   . Breast cancer Maternal Aunt   . Heart failure Father        Mother  . Breast cancer Cousin   . Colon cancer Neg Hx     Current Outpatient Medications (Endocrine & Metabolic):  .  levothyroxine (SYNTHROID) 88 MCG tablet, TAKE 1 TABLET BY MOUTH EVERY DAY  Current Outpatient Medications (Cardiovascular):  .  amLODipine (NORVASC) 5 MG tablet, Take 1 tablet (5 mg total) by mouth daily. .  carvedilol (COREG) 6.25 MG tablet, Take 1 tablet (6.25 mg total) by mouth 2 (two) times daily with a meal. .  isosorbide mononitrate (IMDUR) 30 MG 24 hr tablet, Take 1 tablet (30 mg total) by mouth daily. Marland Kitchen  losartan (COZAAR) 25 MG tablet, Take 1 tablet (25 mg total) by mouth daily. .  nitroGLYCERIN (NITROSTAT) 0.4 MG SL tablet, Place 1 tablet (0.4 mg total) under the tongue every 5 (five) minutes as needed for chest pain. .  rosuvastatin (CRESTOR) 10 MG tablet, TAKE 1 TABLET BY MOUTH EVERY DAY  Current Outpatient Medications (Respiratory):  .  fluticasone (FLONASE) 50 MCG/ACT nasal spray, Place 1 spray into both nostrils daily.   Current Outpatient Medications (Hematological):  .  clopidogrel (PLAVIX) 75 MG tablet, Take 1 tablet (75 mg total) by mouth daily with breakfast.  Current Outpatient Medications (Other):  .  linaclotide (LINZESS) 145 MCG CAPS capsule, Take 1 capsule (145 mcg total) by mouth daily before breakfast. .  meclizine (ANTIVERT) 25 MG tablet, Take 1 tablet (25 mg total) by mouth 2 (two) times daily as needed for dizziness. .  Vitamin D, Ergocalciferol, (DRISDOL) 1.25 MG (50000 UT) CAPS capsule, TAKE 1 CAPSULE (50,000 UNITS TOTAL) BY MOUTH EVERY 7 (SEVEN) DAYS.    Past medical history, social, surgical and family history all reviewed in  electronic medical record.  No pertanent information unless stated regarding to the chief complaint.   Review of Systems:  No headache, visual changes, nausea, vomiting, diarrhea, constipation, dizziness, abdominal pain, skin rash, fevers, chills, night sweats, weight loss, swollen lymph nodes, body aches, joint swelling, , chest pain, shortness of breath, mood changes.  Positive muscle aches  Objective  Blood pressure 120/70, pulse 77, height 5' 7"  (1.702 m), weight 184 lb (83.5 kg), SpO2 99 %.  General: No apparent distress alert and oriented x3 mood and affect normal, dressed appropriately.  HEENT: Pupils equal, extraocular movements intact  Respiratory: Patient's speak in full sentences and does not appear short of breath  Cardiovascular: No lower extremity edema, non tender, no erythema  Skin: Warm dry intact with no signs of infection or rash on extremities or on axial skeleton.  Abdomen: Soft nontender  Neuro: Cranial nerves II through XII are intact, neurovascularly intact in all extremities with 2+ DTRs and 2+ pulses.  Lymph: No lymphadenopathy of posterior or anterior cervical chain or axillae bilaterally.  Gait antalgic gait.  MSK:  Non tender with full range of motion and good stability and symmetric strength and tone of shoulders, elbows, wrist, hip, and ankles bilaterally.  Knee: Left valgus deformity noted.  Abnormal thigh to calf ratio.  Tender to palpation over medial and PF joint line.  ROM full in flexion and extension and lower leg rotation. instability with valgus force.  painful patellar compression. Patellar glide with moderate crepitus. Patellar and quadriceps tendons unremarkable. Hamstring and quadriceps strength is normal. Contralateral knee shows mild arthritic changes as well     After informed written and verbal consent, patient was seated on exam table. Left knee was prepped with alcohol swab and utilizing anterolateral approach, patient's left knee  space was injected with 4:1  marcaine 0.5%: Kenalog 21m/dL. Patient tolerated the procedure well without immediate complications.   Impression and Recommendations:     This case required medical decision making of moderate complexity. The above documentation has been reviewed and is accurate and complete ZLyndal Pulley DO       Note: This dictation was prepared with Dragon dictation along with smaller phrase technology. Any transcriptional errors that result from this process are unintentional.

## 2019-11-19 NOTE — Assessment & Plan Note (Signed)
Injection given again today.  Tolerated the procedure well.  Could be candidate for viscosupplementation but will hold at this time.  Continue to encourage bracing, home exercises, icing regimen follow-up again in 10 weeks

## 2019-11-20 ENCOUNTER — Ambulatory Visit (INDEPENDENT_AMBULATORY_CARE_PROVIDER_SITE_OTHER): Payer: Medicare Other | Admitting: Family Medicine

## 2019-11-20 ENCOUNTER — Other Ambulatory Visit: Payer: Self-pay

## 2019-11-20 ENCOUNTER — Encounter: Payer: Self-pay | Admitting: Family Medicine

## 2019-11-20 ENCOUNTER — Ambulatory Visit: Payer: Medicare Other | Admitting: Internal Medicine

## 2019-11-20 DIAGNOSIS — M1712 Unilateral primary osteoarthritis, left knee: Secondary | ICD-10-CM

## 2019-11-20 NOTE — Patient Instructions (Signed)
See me again in 10 weeks Ice 20 minutes 2 x a day Happy Holidays!

## 2019-12-02 ENCOUNTER — Emergency Department (HOSPITAL_COMMUNITY): Payer: Medicare Other

## 2019-12-02 ENCOUNTER — Encounter (HOSPITAL_COMMUNITY): Payer: Self-pay

## 2019-12-02 ENCOUNTER — Emergency Department (HOSPITAL_COMMUNITY)
Admission: EM | Admit: 2019-12-02 | Discharge: 2019-12-03 | Disposition: A | Discharge status: Home / Self Care | Payer: Medicare Other | Attending: Emergency Medicine | Admitting: Emergency Medicine

## 2019-12-02 DIAGNOSIS — Z743 Need for continuous supervision: Secondary | ICD-10-CM | POA: Diagnosis not present

## 2019-12-02 DIAGNOSIS — R079 Chest pain, unspecified: Secondary | ICD-10-CM | POA: Diagnosis not present

## 2019-12-02 DIAGNOSIS — E039 Hypothyroidism, unspecified: Secondary | ICD-10-CM | POA: Diagnosis not present

## 2019-12-02 DIAGNOSIS — R0789 Other chest pain: Secondary | ICD-10-CM | POA: Diagnosis not present

## 2019-12-02 DIAGNOSIS — K219 Gastro-esophageal reflux disease without esophagitis: Secondary | ICD-10-CM | POA: Diagnosis not present

## 2019-12-02 DIAGNOSIS — I10 Essential (primary) hypertension: Secondary | ICD-10-CM | POA: Diagnosis not present

## 2019-12-02 DIAGNOSIS — Z7902 Long term (current) use of antithrombotics/antiplatelets: Secondary | ICD-10-CM | POA: Diagnosis not present

## 2019-12-02 DIAGNOSIS — Z79899 Other long term (current) drug therapy: Secondary | ICD-10-CM | POA: Diagnosis not present

## 2019-12-02 DIAGNOSIS — R05 Cough: Secondary | ICD-10-CM | POA: Diagnosis not present

## 2019-12-02 DIAGNOSIS — R000 Tachycardia, unspecified: Secondary | ICD-10-CM | POA: Diagnosis not present

## 2019-12-02 DIAGNOSIS — I2510 Atherosclerotic heart disease of native coronary artery without angina pectoris: Secondary | ICD-10-CM | POA: Insufficient documentation

## 2019-12-02 LAB — CBC
HCT: 40.1 % (ref 36.0–46.0)
Hemoglobin: 13.8 g/dL (ref 12.0–15.0)
MCH: 27.6 pg (ref 26.0–34.0)
MCHC: 34.4 g/dL (ref 30.0–36.0)
MCV: 80.2 fL (ref 80.0–100.0)
Platelets: 291 10*3/uL (ref 150–400)
RBC: 5 MIL/uL (ref 3.87–5.11)
RDW: 13.5 % (ref 11.5–15.5)
WBC: 8.2 10*3/uL (ref 4.0–10.5)
nRBC: 0 % (ref 0.0–0.2)

## 2019-12-02 LAB — BASIC METABOLIC PANEL
Anion gap: 10 (ref 5–15)
BUN: 5 mg/dL — ABNORMAL LOW (ref 8–23)
CO2: 21 mmol/L — ABNORMAL LOW (ref 22–32)
Calcium: 9 mg/dL (ref 8.9–10.3)
Chloride: 102 mmol/L (ref 98–111)
Creatinine, Ser: 0.74 mg/dL (ref 0.44–1.00)
GFR calc Af Amer: >60 mL/min (ref >60)
GFR calc non Af Amer: >60 mL/min (ref >60)
Glucose, Bld: 134 mg/dL — ABNORMAL HIGH (ref 70–99)
Potassium: 3.6 mmol/L (ref 3.5–5.1)
Sodium: 133 mmol/L — ABNORMAL LOW (ref 135–145)

## 2019-12-02 LAB — TROPONIN I (HIGH SENSITIVITY): Troponin I (High Sensitivity): 3 ng/L (ref <18)

## 2019-12-02 MED ORDER — SODIUM CHLORIDE 0.9% FLUSH: 3.0000 mL | Freq: Once | INTRAVENOUS | Status: DC

## 2019-12-02 NOTE — ED Triage Notes (Signed)
Pt comes via [facility] for CP that radiates to her back, took one nitro without relief. No other cardiac symptoms

## 2019-12-03 DIAGNOSIS — R0789 Other chest pain: Secondary | ICD-10-CM | POA: Diagnosis not present

## 2019-12-03 DIAGNOSIS — K219 Gastro-esophageal reflux disease without esophagitis: Secondary | ICD-10-CM | POA: Diagnosis not present

## 2019-12-03 LAB — TROPONIN I (HIGH SENSITIVITY): Troponin I (High Sensitivity): 2 ng/L (ref <18)

## 2019-12-03 MED ORDER — AMLODIPINE BESYLATE 5 MG PO TABS
5.0000 mg | ORAL_TABLET | Freq: Once | ORAL | Status: AC
  Administered 2019-12-03: 11:00:00 5 mg via ORAL
  Filled 2019-12-03: qty 1

## 2019-12-03 MED ORDER — CARVEDILOL 12.5 MG PO TABS: 6.2500 mg | ORAL_TABLET | Freq: Two times a day (BID) | ORAL | Status: DC

## 2019-12-03 MED ORDER — LIDOCAINE VISCOUS HCL 2 % MT SOLN
15.0000 mL | Freq: Once | OROMUCOSAL | Status: AC
  Administered 2019-12-03: 15 mL via ORAL
  Filled 2019-12-03: qty 15

## 2019-12-03 MED ORDER — ALUM & MAG HYDROXIDE-SIMETH 200-200-20 MG/5ML PO SUSP
30.0000 mL | Freq: Once | ORAL | Status: AC
  Administered 2019-12-03: 30 mL via ORAL
  Filled 2019-12-03: qty 30

## 2019-12-03 MED ORDER — LOSARTAN POTASSIUM 50 MG PO TABS
25.0000 mg | ORAL_TABLET | ORAL | Status: AC
  Administered 2019-12-03: 25 mg via ORAL
  Filled 2019-12-03: qty 1

## 2019-12-03 MED ORDER — FAMOTIDINE 40 MG PO TABS: 40.0000 mg | ORAL_TABLET | Freq: Every day | ORAL | 0 refills | Status: DC

## 2019-12-03 MED ORDER — CLOPIDOGREL BISULFATE 75 MG PO TABS
75.0000 mg | ORAL_TABLET | Freq: Once | ORAL | Status: AC
  Administered 2019-12-03: 11:00:00 75 mg via ORAL
  Filled 2019-12-03: qty 1

## 2019-12-03 NOTE — Discharge Instructions (Addendum)
This call make an appointment after cardiology in the next week.  Please follow-up with your primary care doctor.  I prescribed you Pepcid.  Please take as prescribed.

## 2019-12-03 NOTE — ED Provider Notes (Addendum)
[facility] EMERGENCY DEPARTMENT Provider Note   CSN: [id number] Arrival date & time: 12/02/19  2107     History Chief Complaint  Patient presents with  . Chest Pain    [name] is a 69 y.o. female with history of hypertension, chronic back pain, esophageal reflux, fibromyalgia, hyperlipidemia, arthritis.  Status post abdominal hysterectomy, cholecystectomy, cervical disc surgery  HPI History of hyperlipidemia, hypertension, fibromyalgia, obesity, CAD on Plavix, ACS/Takotsubo  Patient planes of ED right-sided chest pain that is severe, 10/10 and occasionally radiates to her back.  Patient states that pain was constant Saturday night but became intermittent.  Patient states that it was gone for several hours while she was in the waiting room is currently 10 / 10.  Patient has difficulty describing pain but stated it is not pleuritic or exertional.  Seems to have been when she is at rest.  Patient also endorses sinus drainage coughing and achiness in her back and neck.  Patient states she has a history of acid reflux and states that her chest pain is burning and irritated in character.  Patient states she takes no medications for acid reflux.  Patient states that her symptoms were not relieved with nitroglycerin that she was given by EMS.  My review of EMR patient has history of ACS/Takotsubo cardiomyopathy 05/11/2018.  Was most recently seen by cardiology 3 months ago.  Recent hospital admission was 08/17/2019 when she was at admitted for chest pain.  EKG and troponins were normal.  Echo with normal LV function and normal contractility.  Recent heart cath 05/08/2018 showed LAD branches with stenosis (70%).     Past Medical History:  Diagnosis Date  . Anemia   . Arthritis   . Cholelithiasis   . Chronic back pain   . Constipation   . Esophageal reflux   . Fibromyalgia   . Hyperlipidemia   . Hypertension   . Hypothyroid   . IBS (irritable bowel syndrome)   .  Internal hemorrhoids without mention of complication   . Left leg pain   . Personal history of colonic polyps 05/06/2010   ADENOMATOUS POLYP     Patient Active Problem List   Diagnosis Date Noted  . Chest pain 08/17/2019  . Takotsubo cardiomyopathy 02/26/2019  . NASH (nonalcoholic steatohepatitis) 01/22/2019  . Chronic idiopathic constipation 01/12/2019  . Arthritis of carpometacarpal [facility]) joint of left thumb 09/25/2018  . Degenerative arthritis of left knee 05/24/2018  . CAD (coronary artery disease) 05/16/2018  . Vitamin D deficiency 11/11/2017  . Greater trochanteric bursitis of left hip 08/29/2017  . Degenerative disc disease, lumbar 08/29/2017  . Prediabetes 06/14/2016  . Obesity (BMI 30.0-34.9) 06/14/2016  . Routine general medical examination at a health care facility 12/31/2014  . Osteopenia 07/20/2013  . GERD (gastroesophageal reflux disease) 09/16/2011  . Generalized anxiety disorder 09/16/2011  . Constipation, slow transit 09/16/2011  . DJD (degenerative joint disease) of knee 06/25/2011  . Pure hyperglyceridemia 06/24/2011  . Hyperlipidemia LDL goal <70 07/30/2009  . Hypothyroidism 04/25/2009  . Essential hypertension 04/25/2009  . FIBROMYALGIA 04/25/2009    Past Surgical History:  Procedure Laterality Date  . ABDOMINAL HYSTERECTOMY    . CERVICAL DISC SURGERY     PLATE IN NECK & BACK  . CHOLECYSTECTOMY    . COLONOSCOPY  09/27/2011   NORMAL   . LEFT HEART CATH AND CORONARY ANGIOGRAPHY N/A 05/08/2018   Procedure: LEFT HEART CATH AND CORONARY ANGIOGRAPHY;  Surgeon: Belva Crome, MD;  Location:  [facility]VASIVE CV [facility];  Service: Cardiovascular;  Laterality: N/A;  . LUMBAR [place]    . TONSILLECTOMY       OB History    Gravida  3   Para  2   Term      Preterm      AB  1   Living  2     SAB  1   TAB      Ectopic      Multiple      Live Births              Family History  Problem Relation Age of Onset  . Diabetes Mother         aunts and uncles  . Kidney disease Mother        stage 3  . Lung disease Mother        colapsed lung/in hospice  . COPD Mother   . Hypertension Mother   . Dementia Mother   . Breast cancer Maternal Aunt   . Heart failure Father        Mother  . Breast cancer Cousin   . Colon cancer Neg Hx     Social History   Tobacco Use  . Smoking status: Never Smoker  . Smokeless tobacco: Never Used  Substance Use Topics  . Alcohol use: No    Alcohol/week: 0.0 standard drinks  . Drug use: No    Home Medications Prior to Admission medications   Medication Sig Start Date End Date Taking? Authorizing Provider  amLODipine (NORVASC) 5 MG tablet Take 1 tablet (5 mg total) by mouth daily. 09/06/19   Daune Perch, NP  carvedilol (COREG) 6.25 MG tablet Take 1 tablet (6.25 mg total) by mouth 2 (two) times daily with a meal. 09/06/19   Daune Perch, NP  clopidogrel (PLAVIX) 75 MG tablet Take 1 tablet (75 mg total) by mouth daily with breakfast. 04/24/19   [name], MD  fluticasone [facility]) 50 MCG/ACT nasal spray Place 1 spray into both nostrils daily. 05/12/18   [facility], Tami Lin, PA  isosorbide mononitrate (IMDUR) 30 MG 24 hr tablet Take 1 tablet (30 mg total) by mouth daily. 04/24/19   [name], MD  levothyroxine (SYNTHROID) 88 MCG tablet TAKE 1 TABLET BY MOUTH EVERY DAY 09/29/19   [name], MD  linaclotide [facility]) 145 MCG CAPS capsule Take 1 capsule (145 mcg total) by mouth daily before breakfast. 03/20/19   [name], MD  losartan (COZAAR) 25 MG tablet Take 1 tablet (25 mg total) by mouth daily. 04/24/19   [name], MD  meclizine (ANTIVERT) 25 MG tablet Take 1 tablet (25 mg total) by mouth 2 (two) times daily as needed for dizziness. 08/04/19   Drenda Freeze, MD  nitroGLYCERIN (NITROSTAT) 0.4 MG SL tablet Place 1 tablet (0.4 mg total) under the tongue every 5 (five) minutes as needed for chest pain. 09/06/19   Daune Perch, NP  rosuvastatin (CRESTOR) 10 MG  tablet TAKE 1 TABLET BY MOUTH EVERY DAY 07/12/19   [name], MD  Vitamin D, Ergocalciferol, (DRISDOL) 1.25 MG (50000 UT) CAPS capsule TAKE 1 CAPSULE (50,000 UNITS TOTAL) BY MOUTH EVERY 7 (SEVEN) DAYS. 10/01/19   [name], MD    Allergies    Aspirin, Gadolinium, Iohexol, Livalo [pitavastatin], Hydrocodone-acetaminophen, and Lipitor [atorvastatin]  Review of Systems   Review of Systems  Constitutional: Negative for chills and fever.  HENT: Positive for congestion and rhinorrhea.   Eyes: Negative  for pain.  Respiratory: Positive for cough. Negative for shortness of breath.   Cardiovascular: Positive for chest pain. Negative for leg swelling.  Gastrointestinal: Negative for abdominal pain and vomiting.  Genitourinary: Negative for dysuria.  Musculoskeletal: Positive for back pain. Negative for myalgias.  Skin: Negative for rash.  Neurological: Negative for dizziness and headaches.    Physical Exam Updated Vital Signs BP (!) 150/90 (BP Location: Right Arm)   Pulse 85   Temp 98.8 F (37.1 C) (Oral)   Resp 18   SpO2 100%   Physical Exam Vitals and nursing note reviewed.  Constitutional:      General: She is not in acute distress.    Comments: Appears to be in significant distress.  Is clutching her chest and pulling around her bed.  HENT:     Head: Normocephalic and atraumatic.     Nose: Nose normal.     Mouth/Throat:     Mouth: Mucous membranes are moist.  Eyes:     General: No scleral icterus. Cardiovascular:     Rate and Rhythm: Normal rate and regular rhythm.     Pulses: Normal pulses.     Heart sounds: Normal heart sounds.  Pulmonary:     Effort: Pulmonary effort is normal. No respiratory distress.     Breath sounds: No wheezing.     Comments: Lungs are clear to auscultation bilaterally.  No increased work of breathing.  Speaking in full sentences Abdominal:     Palpations: Abdomen is soft.     Tenderness: There is no abdominal tenderness.     Comments:  No tenderness of abdomen.  No CVA tenderness.  Musculoskeletal:     Cervical back: Normal range of motion. No rigidity.     Right lower leg: No edema.     Left lower leg: No edema.     Comments: No calf tenderness.  No lower extremity edema.  Skin:    General: Skin is warm and dry.     Capillary Refill: Capillary refill takes less than 2 seconds.     Comments: Lower extremities without edema or swelling.  Neurological:     Mental Status: She is alert. Mental status is at baseline.     Comments: Alert and oriented.  Moves all 4 limbs.  Sensation intact all 4 limbs.  Psychiatric:        Mood and Affect: Mood normal.        Behavior: Behavior normal.     ED Results / Procedures / Treatments   Labs (all labs ordered are listed, but only abnormal results are displayed) Labs Reviewed  BASIC METABOLIC PANEL - Abnormal; Notable for the following components:      Result Value   Sodium 133 (*)    CO2 21 (*)    Glucose, Bld 134 (*)    BUN 5 (*)    All other components within normal limits  CBC  TROPONIN I (HIGH SENSITIVITY)  TROPONIN I (HIGH SENSITIVITY)    EKG EKG Interpretation  Date/Time:  Sunday December 02 2019 21:10:00 EST Ventricular Rate:  103 PR Interval:  192 QRS Duration: 76 QT Interval:  328 QTC Calculation: 429 R Axis:   -30 Text Interpretation: Sinus tachycardia Left axis deviation Cannot rule out Anteroseptal infarct , age undetermined Abnormal ECG When compared with ECG of 08/17/2019, HEART RATE has increased Confirmed by Delora Fuel ([id number]) on 12/03/2019 12:14:11 AM   Radiology DG Chest 2 View  Result Date: 12/02/2019 CLINICAL DATA:  Chest pain  EXAM: CHEST - 2 VIEW COMPARISON:  08/17/2019 FINDINGS: The heart size and mediastinal contours are within normal limits. Both lungs are clear. Mild degenerative changes of the spine. IMPRESSION: No active cardiopulmonary disease. Electronically Signed   By: Donavan Foil M.D.   On: 12/02/2019 21:39     Procedures Procedures (including critical care time)  Medications Ordered in ED Medications  sodium chloride flush (NS) 0.9 % injection 3 mL (has no administration in time range)    ED Course  I have reviewed the triage vital signs and the nursing notes.  Pertinent labs & imaging results that were available during my care of the patient were reviewed by me and considered in my medical decision making (see chart for details).    MDM Rules/Calculators/A&P                      Is 69 year old female presented today with achy chest pain that started Saturday night.  Is been constant until she came to ED and became intermittent.  Patient states pain radiates to her back.  She has a history of GERD, hypertension, hyperlipidemia.     Presentation most concerning for acid reflux or ACS also considered emergent diagnoses including thoracic aortic dissection, PE, peptic ulcer disease, pneumothorax, [place].  Chest x-ray independently reviewed myself with no evidence of infiltrate, pulmonary edema, widened mediastinum, or other acute cardiopulmonary disease.  BMP is unremarkable mild hyponatremia unlikely be causing any of her symptoms.  No leukocytosis.  Troponin x2 within normal limits.  EKG is nonischemic.  As patient symptoms were not improved with nitroglycerin and perhaps somewhat worsened I have some suspicion that patient is experiencing severe acid reflux.Perhaps nitroglycerin has relaxed her LES and caused worsening reflux.  Her chest pain description is atypical for ACS.  And as discussed above troponin x2 within normal limits.  I discussed this case with my attending physician who cosigned this note including patient's presenting symptoms, physical exam, and planned diagnostics and interventions. Attending physician stated agreement with plan or made changes to plan which were implemented.   Attending physician assessed patient at bedside.  Patient given her home medications as  she has not taken them this morning due to long ED wait time.  Also given GI cocktail.  On reassessment patient states that her pain was 10/10 and is now approximately 90% better.  States she is comfortable of discharge home with follow-up with cardiology.  Will discharge patient with Pepcid as I suspect that she has some degree of of reflux contributing to her pain.  Patient given strict return precautions.   Patient's most recent hospitalization for chest pain was without significant findings.  Her cardiac catheter showed some stenosis and minor vessels however was overall reassuring.  No evidence of Takotsubo cardiomyopathy on her last echo.  Discussed with attending physician Lajean Saver who agreed that patient is well for discharge with cardiology follow-up in 1 week.     The medical records were personally reviewed by myself. I personally reviewed all lab results and interpreted all imaging studies and either concurred with their official read or contacted radiology for clarification.   This patient appears reasonably screened and I doubt any other medical condition requiring further workup, evaluation, or treatment in the ED at this time prior to discharge.   Patient's vitals are WNL apart from vital sign abnormalities discussed above, patient is in NAD, and able to ambulate in the ED at their baseline. Pain has been managed or  a plan has been made for home management and has no complaints prior to discharge. Patient is comfortable with above plan and for discharge at this time. All questions were answered prior to disposition. Results from the ER workup discussed with the patient face to face and all questions answered to the best of my ability. The patient is safe for discharge with strict return precautions. Patient appears safe for discharge with appropriate follow-up. Conveyed my impression with the patient and they voiced understanding and are agreeable to plan.   An After Visit  Summary was printed and given to the patient.  Portions of this note were generated with [employer]. Dictation errors may occur despite best attempts at proofreading.   JAXSON KEENER was evaluated in Emergency Department on 12/03/2019 for the symptoms described in the history of present illness. She was evaluated in the context of the global COVID-19 pandemic, which necessitated consideration that the patient might be at risk for infection with the SARS-CoV-2 virus that causes COVID-19. Institutional protocols and algorithms that pertain to the evaluation of patients at risk for COVID-19 are in a state of rapid change based on information released by regulatory bodies including the CDC and federal and state organizations. These policies and algorithms were followed during the patient's care in the ED.   Final Clinical Impression(s) / ED Diagnoses Final diagnoses:  None    Rx / DC Orders ED Discharge Orders    None       Tedd Sias, [place] 12/03/19 1132    Lajean Saver, MD 12/03/19 1415    Pati Gallo [place], [place] 12/03/19 1651    Lajean Saver, MD 12/04/19 0700

## 2019-12-10 ENCOUNTER — Ambulatory Visit: Payer: Medicare Other | Admitting: Cardiology

## 2019-12-10 ENCOUNTER — Telehealth: Payer: Self-pay

## 2019-12-10 NOTE — Telephone Encounter (Signed)
Copied from [place] [phone]. Topic: General - Other >> Dec 10, 2019  1:38 PM Yvette Rack wrote: Reason for CRM: Pt stated she went to get a Covid test because she loss her sense of taste and smell over the weekend. Pt stated she is waiting for the test results but would like Dr. [name] nurse to call her back because she has questions about possible medications that she can take. Pt requests call back. Cb# [phone]

## 2019-12-10 NOTE — Progress Notes (Deleted)
Cardiology Office Note:    Date:  12/10/2019   ID:  [name], DOB Sep 15, 1951, MRN [id number]  PCP:  Emily Lima, MD  Cardiologist:  Emily Mocha, MD  Referring MD: Emily Lima, MD   No chief complaint on file. ***  History of Present Illness:    [name] is a 69 y.o. female with a past medical history significant for ACS/Takotsubo CM 05/11/2018 w/ D2 70% (small vessel) and EF 45%, HTN, HLD, hypothyroid, GERD, fibromyalgia, NASH, obesity.   She had an ER visit on 08/04/19 for headache and dizziness associated with poorly controlled hypertension with blood pressure 178/82.  The ER MD suspected possible migraine, or mild dehydration.  She felt better after migraine cocktail and was discharged.  On 9/14 at PCP visit, BP 184/96, indapamide 1.25 mg daily was added.  The patient was then admitted to the hospital 08/17/2019 with complaints of chest pain.  She ruled out for MI by enzymes.  EKG without acute ischemia.  Echocardiogram showed normal LV systolic function, actually hyperdynamic, with EF 70-75%.  There was elevated LV end-diastolic pressure.  No LVH.  Her blood pressure continued to be elevated.  Metoprolol was switched to carvedilol and indapamide was discontinued in favor of amlodipine for its anti-vasospastic action.  [name] was seen in the ED on 12/01/2018 with complaints of severe chest pain.  Enzymes were negative and EKG was nonischemic.  Her symptoms were felt to be atypical and are greatly improved with GI cocktail.  She was started on Pepcid for presumed GERD and able to be discharged home.  She is here today for follow-up of that ED visit.  ?increase carvedilol or losartan   Cardiac studies   Echocardiogram 08/17/2019 IMPRESSIONS   1. Left ventricular ejection fraction, by visual estimation, is 70 to 75%. The left ventricle has hyperdynamic function. Normal left ventricular size. There is no left ventricular hypertrophy. 2. Elevated left  ventricular end-diastolic pressure. 3. Left ventricular diastolic Doppler parameters are consistent with impaired relaxation pattern of LV diastolic filling. 4. Global right ventricle has normal systolic function.The right ventricular size is normal. No increase in right ventricular wall thickness. 5. Left atrial size was normal. 6. Right atrial size was normal. 7. The mitral valve is normal in structure. No evidence of mitral valve regurgitation. No evidence of mitral stenosis. 8. The tricuspid valve is normal in structure. Tricuspid valve regurgitation was not visualized by color flow Doppler. 9. The aortic valve is normal in structure. Aortic valve regurgitation was not visualized by color flow Doppler. Structurally normal aortic valve, with no evidence of sclerosis or stenosis. 10. The pulmonic valve was normal in structure. Pulmonic valve regurgitation is not visualized by color flow Doppler. 11. The inferior vena cava is normal in size with greater than 50% respiratory variability, suggesting right atrial pressure of 3 mmHg.  ECHO: 09/12/2018 - Left ventricle: The cavity size was normal. There was mild concentric hypertrophy. Systolic function was normal. The estimated ejection fraction was in the range of 55% to 60%. Wall motion was normal; there were no regional wall motion abnormalities. Doppler parameters are consistent with indetermiante left ventricular relaxation (grade 1 diastolic dysfunction). Doppler parameters are consistent with indeterminate ventricular filling pressure. - Aortic valve: Transvalvular velocity was within the normal range. There was no stenosis. There was no regurgitation. - Mitral valve: Transvalvular velocity was within the normal range. There was no evidence for stenosis. There was no regurgitation. - Right ventricle: The cavity size  was normal. Wall thickness was normal. Systolic function was normal. - Atrial septum: No  defect or patent foramen ovale was identified by color flow Doppler. - Tricuspid valve: There was trivial regurgitation. - Pulmonary arteries: Systolic pressure was within the normal range. PA peak pressure: 20 mm Hg (S). - Global longitudinal strain -20.9% (normal).  CATH:Urgent left cardiac catheterization6/10/19 Conclusion    Acute coronary syndrome with anterior wall motion abnormality and essentially widely patent coronary arteries. Suspect variant stress cardiomyopathy. (Cannot totally exclude transient occlusion and recanalization of the first diagonal which is relatively small and has moderate somewhat hazy disease in the mid segment up to 70%.)  Normal left main  LAD is large, wraps around the left ventricular apex, and gives origin to 3 diagonal branches, the second of which is discussed above with mid vessel 70% narrowing in an artery that is less than 2 mm in diameter. Beyond that there is either a small branch or occlusion of the branch. Images are ambiguous.  Circumflex is large, first marginal and second marginal widely patent. Second marginal bifurcates.  Right coronary is tortuous but widely patent.  Abnormal left ventricular wall motion with mid to distal anterior wall akinesis, EF 45%, and elevated LVEDP consistent with acute combined systolic and diastolic heart failure.  RECOMMENDATIONS:   Risk factor modification: Blood pressure 130/80 mmHg or less, LDL cholesterol less than 70, check A1c, and encourage exercise when appropriate.  Add low-dose beta-blocker  IV nitroglycerin and IV heparin x24 hours then wean.  2D Doppler echocardiogram tomorrow.  Diagnostic Dominance: Right    ECHO6/12/19 Study Conclusions - Left ventricle: The cavity size was normal. There was mild concentric hypertrophy. Systolic function was mildly reduced. The estimated ejection fraction was in the range of 45% to 50%. Hypokinesis of the apicalanterior and  apical myocardium; consistent with ischemia in the distribution of the left anterior descending coronary artery. Doppler parameters are consistent with abnormal left ventricular relaxation (grade 1 diastolic dysfunction).     Past Medical History:  Diagnosis Date  . Anemia   . Arthritis   . Cholelithiasis   . Chronic back pain   . Constipation   . Esophageal reflux   . Fibromyalgia   . Hyperlipidemia   . Hypertension   . Hypothyroid   . IBS (irritable bowel syndrome)   . Internal hemorrhoids without mention of complication   . Left leg pain   . Personal history of colonic polyps 05/06/2010   ADENOMATOUS POLYP    Past Surgical History:  Procedure Laterality Date  . ABDOMINAL HYSTERECTOMY    . CERVICAL DISC SURGERY     PLATE IN NECK & BACK  . CHOLECYSTECTOMY    . COLONOSCOPY  09/27/2011   NORMAL   . LEFT HEART CATH AND CORONARY ANGIOGRAPHY N/A 05/08/2018   Procedure: LEFT HEART CATH AND CORONARY ANGIOGRAPHY;  Surgeon: Belva Crome, MD;  Location: [facility];  Service: Cardiovascular;  Laterality: N/A;  . LUMBAR [place]    . TONSILLECTOMY      Current Medications: No outpatient medications have been marked as taking for the 12/10/19 encounter (Appointment) with Daune Perch, NP.     Allergies:   Aspirin, Gadolinium, Iohexol, Livalo [pitavastatin], Hydrocodone-acetaminophen, and Lipitor [atorvastatin]   Social History   Socioeconomic History  . Marital status: Widowed    Spouse name: Not on file  . Number of children: 2  . Years of education: Not on file  . Highest education level: Not on file  Occupational History  . Occupation: housewife    Employer: UNEMPLOYED   Tobacco Use  . Smoking status: Never Smoker  . Smokeless tobacco: Never Used  Substance and Sexual Activity  . Alcohol use: No    Alcohol/week: 0.0 standard drinks  . Drug use: No  . Sexual activity: Not Currently  Other Topics Concern  . Not on file  Social History  Narrative   Daily Caffeine Use   Social Determinants of Health   Financial Resource Strain:   . Difficulty of Paying Living Expenses: Not on file  Food Insecurity:   . Worried About Charity fundraiser in the Last Year: Not on file  . Ran Out of Food in the Last Year: Not on file  Transportation Needs:   . Lack of Transportation (Medical): Not on file  . Lack of Transportation (Non-Medical): Not on file  Physical Activity: Inactive  . Days of Exercise per Week: 0 days  . Minutes of Exercise per Session: 0 min  Stress:   . Feeling of Stress : Not on file  Social Connections:   . Frequency of Communication with Friends and Family: Not on file  . Frequency of Social Gatherings with Friends and Family: Not on file  . Attends Religious Services: Not on file  . Active Member of Clubs or Organizations: Not on file  . Attends Archivist Meetings: Not on file  . Marital Status: Not on file     Family History: The patient's family history includes Breast cancer in her cousin and maternal aunt; COPD in her mother; Dementia in her mother; Diabetes in her mother; Heart failure in her father; Hypertension in her mother; Kidney disease in her mother; Lung disease in her mother. There is no history of Colon cancer. ROS:   Please see the history of present illness.     All other systems reviewed and are negative.   EKG:  EKG is *** ordered today.  The ekg ordered today demonstrates ***  Recent Labs: 05/21/2019: Magnesium 1.9 08/13/2019: ALT 20; TSH 2.80 12/02/2019: BUN 5; Creatinine, Ser 0.74; Hemoglobin 13.8; Platelets 291; Potassium 3.6; Sodium 133   Recent Lipid Panel    Component Value Date/Time   CHOL 123 08/17/2019 0747   CHOL 109 06/27/2018 0812   TRIG 103 08/17/2019 0747   HDL 47 08/17/2019 0747   HDL 43 06/27/2018 0812   CHOLHDL 2.6 08/17/2019 0747   VLDL 21 08/17/2019 0747   LDLCALC 55 08/17/2019 0747   LDLCALC 43 06/27/2018 0812   LDLDIRECT 88.0 10/19/2016 0844     Physical Exam:    VS:  There were no vitals taken for this visit.    Wt Readings from Last 6 Encounters:  11/20/19 184 lb (83.5 kg)  09/06/19 182 lb 12.8 oz (82.9 kg)  09/03/19 184 lb 4 oz (83.6 kg)  08/28/19 186 lb (84.4 kg)  08/17/19 185 lb (83.9 kg)  08/13/19 185 lb (83.9 kg)     Physical Exam***   ASSESSMENT:    1. Chest pain, unspecified type   2. Coronary artery disease of native artery of native heart with stable angina pectoris ([place])   3. Essential (primary) hypertension   4. Hyperlipidemia LDL goal <70    PLAN:    In order of problems listed above:  Chest pain -Patient presented to the ED with chest pain on 12/02/2019.  Enzymes were negative and EKG nonischemic.  Symptoms resolved with GI cocktail.  Patient was started on Pepcid for  treatment of presumed GERD. -  CAD -History of small D2 mid 70% and occluded distally by cath 04/2018, medical therapy only option since less than 2 mm diameter -Seen at the hospital 08/17/2019 for chest pain.  Echocardiogram with no wall motion abnormalities, normal EF.  Ruled out for MI by EKG and enzymes.  Medication changes for better blood pressure control and amlodipine for possible coronary vasospasm. -Continues on Imdur, Plavix, beta-blocker, statin  Hypertension -On amlodipine 5 mg, carvedilol 6.25 mg, Imdur 30 mg, losartan 25 mg  Hyperlipidemia -On rosuvastatin 10 mg daily.  LDL is 55 07/2019, at goal of less than 70.  Continue current therapy.    Medication Adjustments/Labs and Tests Ordered: Current medicines are reviewed at length with the patient today.  Concerns regarding medicines are outlined above. Labs and tests ordered and medication changes are outlined in the patient instructions below:  There are no Patient Instructions on file for this visit.   Signed, Daune Perch, NP  12/10/2019 4:37 AM    [place]

## 2019-12-10 NOTE — Telephone Encounter (Signed)
Pt stated that she tested today. Pt wanted to know if there was anything that she could do to get her sense of taste and her sense of smell.   Informed pt that there was nothing that we knew of to make that better.

## 2019-12-13 ENCOUNTER — Telehealth: Payer: Self-pay

## 2019-12-13 NOTE — Telephone Encounter (Signed)
No, I sure don't. I did look them up and it looks like some kind of primary care or urgent care

## 2019-12-13 NOTE — Telephone Encounter (Signed)
Copied from [place]. Topic: Referral - Request for Referral >> Dec 13, 2019  1:38 PM Beverley Fiedler wrote: Patient requesting a referral to [facility] [phone].Patient would like a callback from nurse in regards to referral at [phone]

## 2019-12-13 NOTE — Telephone Encounter (Signed)
Pt called saying she need to add that she tested yesterday for Covid.  She took the test on Monday and got her results yesterday.    CB#  [phone]

## 2019-12-14 NOTE — Telephone Encounter (Signed)
Spoke to pt and she stated that she did request the referral. It is a company that she found out about from her sister and brother-in-law. They come out to the patients home and check on them, BP and other vitals. They also make sure that they have other needs as well while pt is quarantining.   Called to [facility]. They are faxing over a referral form. Gave Lori's number.

## 2019-12-14 NOTE — Telephone Encounter (Signed)
Form completed and faxed. Pt informed of same.

## 2019-12-14 NOTE — Telephone Encounter (Signed)
Received referral form - gave to [facility]

## 2019-12-16 ENCOUNTER — Other Ambulatory Visit: Payer: Self-pay | Admitting: Internal Medicine

## 2019-12-17 NOTE — Telephone Encounter (Signed)
Pt states [facility] has never received the paperwork..  Pt states she has a fax numer, I do not see that a fax number was provided. Pt states she needs the referral faxed ASAP.  Pt asked if I could do now.  Fax:  [phone]   [phone]

## 2019-12-18 ENCOUNTER — Other Ambulatory Visit: Payer: Self-pay | Admitting: Internal Medicine

## 2019-12-18 DIAGNOSIS — I10 Essential (primary) hypertension: Secondary | ICD-10-CM

## 2019-12-18 NOTE — Telephone Encounter (Signed)
Pt is calling again, to have this referral faxed.  Pt is upset that it has not been handled yet.   She says it is urgent  [phone] please call pt  She has been waiting since last Thursday

## 2019-12-18 NOTE — Telephone Encounter (Signed)
Pt informed that [facility] has received the fax for the referral and will be calling pt today.

## 2019-12-31 ENCOUNTER — Ambulatory Visit (INDEPENDENT_AMBULATORY_CARE_PROVIDER_SITE_OTHER): Payer: Medicare Other | Admitting: Internal Medicine

## 2019-12-31 ENCOUNTER — Encounter: Payer: Self-pay | Admitting: Internal Medicine

## 2019-12-31 ENCOUNTER — Other Ambulatory Visit: Payer: Self-pay

## 2019-12-31 VITALS — BP 136/84 | HR 86 | Temp 97.8°F | Resp 16 | Ht 67.0 in | Wt 178.0 lb

## 2019-12-31 DIAGNOSIS — K2100 Gastro-esophageal reflux disease with esophagitis, without bleeding: Secondary | ICD-10-CM

## 2019-12-31 DIAGNOSIS — I10 Essential (primary) hypertension: Secondary | ICD-10-CM

## 2019-12-31 DIAGNOSIS — R7303 Prediabetes: Secondary | ICD-10-CM | POA: Diagnosis not present

## 2019-12-31 DIAGNOSIS — E039 Hypothyroidism, unspecified: Secondary | ICD-10-CM

## 2019-12-31 DIAGNOSIS — E559 Vitamin D deficiency, unspecified: Secondary | ICD-10-CM

## 2019-12-31 LAB — BASIC METABOLIC PANEL
BUN: 8 mg/dL (ref 6–23)
CO2: 30 mEq/L (ref 19–32)
Calcium: 9.6 mg/dL (ref 8.4–10.5)
Chloride: 103 mEq/L (ref 96–112)
Creatinine, Ser: 0.68 mg/dL (ref 0.40–1.20)
GFR: 103.93 mL/min (ref >60.00)
Glucose, Bld: 114 mg/dL — ABNORMAL HIGH (ref 70–99)
Potassium: 3.7 mEq/L (ref 3.5–5.1)
Sodium: 140 mEq/L (ref 135–145)

## 2019-12-31 LAB — HEMOGLOBIN A1C: Hgb A1c MFr Bld: 6.2 % (ref 4.6–6.5)

## 2019-12-31 LAB — TSH: TSH: 1.06 u[IU]/mL (ref 0.35–4.50)

## 2019-12-31 LAB — VITAMIN D 25 HYDROXY (VIT D DEFICIENCY, FRACTURES): VITD: 44.77 ng/mL (ref 30.00–100.00)

## 2019-12-31 MED ORDER — FAMOTIDINE 40 MG PO TABS: 40.0000 mg | ORAL_TABLET | Freq: Every day | ORAL | 1 refills | Status: DC

## 2019-12-31 MED ORDER — DEXLANSOPRAZOLE 30 MG PO CPDR: 30.0000 mg | DELAYED_RELEASE_CAPSULE | Freq: Every day | ORAL | 1 refills | Status: DC

## 2019-12-31 NOTE — Patient Instructions (Signed)
Gastroesophageal Reflux Disease, Adult Gastroesophageal reflux (GER) happens when acid from the stomach flows up into the tube that connects the mouth and the stomach (esophagus). Normally, food travels down the esophagus and stays in the stomach to be digested. However, when a person has GER, food and stomach acid sometimes move back up into the esophagus. If this becomes a more serious problem, the person may be diagnosed with a disease called gastroesophageal reflux disease (GERD). GERD occurs when the reflux:  Happens often.  Causes frequent or severe symptoms.  Causes problems such as damage to the esophagus. When stomach acid comes in contact with the esophagus, the acid may cause soreness (inflammation) in the esophagus. Over time, GERD may create small holes (ulcers) in the lining of the esophagus. What are the causes? This condition is caused by a problem with the muscle between the esophagus and the stomach (lower esophageal sphincter, or LES). Normally, the LES muscle closes after food passes through the esophagus to the stomach. When the LES is weakened or abnormal, it does not close properly, and that allows food and stomach acid to go back up into the esophagus. The LES can be weakened by certain dietary substances, medicines, and medical conditions, including:  Tobacco use.  Pregnancy.  Having a hiatal hernia.  Alcohol use.  Certain foods and beverages, such as coffee, chocolate, onions, and peppermint. What increases the risk? You are more likely to develop this condition if you:  Have an increased body weight.  Have a connective tissue disorder.  Use NSAID medicines. What are the signs or symptoms? Symptoms of this condition include:  Heartburn.  Difficult or painful swallowing.  The feeling of having a lump in the throat.  Abitter taste in the mouth.  Bad breath.  Having a large amount of saliva.  Having an upset or bloated  stomach.  Belching.  Chest pain. Different conditions can cause chest pain. Make sure you see your health care provider if you experience chest pain.  Shortness of breath or wheezing.  Ongoing (chronic) cough or a night-time cough.  Wearing away of tooth enamel.  Weight loss. How is this diagnosed? Your health care provider will take a medical history and perform a physical exam. To determine if you have mild or severe GERD, your health care provider may also monitor how you respond to treatment. You may also have tests, including:  A test to examine your stomach and esophagus with a small camera (endoscopy).  A test thatmeasures the acidity level in your esophagus.  A test thatmeasures how much pressure is on your esophagus.  A barium swallow or modified barium swallow test to show the shape, size, and functioning of your esophagus. How is this treated? The goal of treatment is to help relieve your symptoms and to prevent complications. Treatment for this condition may vary depending on how severe your symptoms are. Your health care provider may recommend:  Changes to your diet.  Medicine.  Surgery. Follow these instructions at home: Eating and drinking   Follow a diet as recommended by your health care provider. This may involve avoiding foods and drinks such as: ? Coffee and tea (with or without caffeine). ? Drinks that containalcohol. ? Energy drinks and sports drinks. ? Carbonated drinks or sodas. ? Chocolate and cocoa. ? Peppermint and mint flavorings. ? Garlic and onions. ? Horseradish. ? Spicy and acidic foods, including peppers, chili powder, curry powder, vinegar, hot sauces, and barbecue sauce. ? Citrus fruit juices and citrus   fruits, such as oranges, lemons, and limes. ? Tomato-based foods, such as red sauce, chili, salsa, and pizza with red sauce. ? Fried and fatty foods, such as donuts, french fries, potato chips, and high-fat dressings. ? High-fat  meats, such as hot dogs and fatty cuts of red and white meats, such as rib eye steak, sausage, ham, and bacon. ? High-fat dairy items, such as whole milk, butter, and cream cheese.  Eat small, frequent meals instead of large meals.  Avoid drinking large amounts of liquid with your meals.  Avoid eating meals during the 2-3 hours before bedtime.  Avoid lying down right after you eat.  Do not exercise right after you eat. Lifestyle   Do not use any products that contain nicotine or tobacco, such as cigarettes, e-cigarettes, and chewing tobacco. If you need help quitting, ask your health care provider.  Try to reduce your stress by using methods such as yoga or meditation. If you need help reducing stress, ask your health care provider.  If you are overweight, reduce your weight to an amount that is healthy for you. Ask your health care provider for guidance about a safe weight loss goal. General instructions  Pay attention to any changes in your symptoms.  Take over-the-counter and prescription medicines only as told by your health care provider. Do not take aspirin, ibuprofen, or other NSAIDs unless your health care provider told you to do so.  Wear loose-fitting clothing. Do not wear anything tight around your waist that causes pressure on your abdomen.  Raise (elevate) the head of your bed about 6 inches (15 cm).  Avoid bending over if this makes your symptoms worse.  Keep all follow-up visits as told by your health care provider. This is important. Contact a health care provider if:  You have: ? New symptoms. ? Unexplained weight loss. ? Difficulty swallowing or it hurts to swallow. ? Wheezing or a persistent cough. ? A hoarse voice.  Your symptoms do not improve with treatment. Get help right away if you:  Have pain in your arms, neck, jaw, teeth, or back.  Feel sweaty, dizzy, or light-headed.  Have chest pain or shortness of breath.  Vomit and your vomit looks  like blood or coffee grounds.  Faint.  Have stool that is bloody or black.  Cannot swallow, drink, or eat. Summary  Gastroesophageal reflux happens when acid from the stomach flows up into the esophagus. GERD is a disease in which the reflux happens often, causes frequent or severe symptoms, or causes problems such as damage to the esophagus.  Treatment for this condition may vary depending on how severe your symptoms are. Your health care provider may recommend diet and lifestyle changes, medicine, or surgery.  Contact a health care provider if you have new or worsening symptoms.  Take over-the-counter and prescription medicines only as told by your health care provider. Do not take aspirin, ibuprofen, or other NSAIDs unless your health care provider told you to do so.  Keep all follow-up visits as told by your health care provider. This is important. This information is not intended to replace advice given to you by your health care provider. Make sure you discuss any questions you have with your health care provider. Document Revised: 05/24/2018 Document Reviewed: 05/24/2018 Elsevier Patient Education  2020 [employer]  

## 2019-12-31 NOTE — Progress Notes (Signed)
Subjective:  Patient ID: [name], female    DOB: 02-23-1951  Age: 69 y.o. MRN: [id number]  CC: Hypertension, Gastroesophageal Reflux, and Hypothyroidism   This visit occurred during the SARS-CoV-2 public health emergency.  Safety protocols were in place, including screening questions prior to the visit, additional usage of staff PPE, and extensive cleaning of exam room while observing appropriate contact time as indicated for disinfecting solutions.    HPI [name] presents for f/up - She was seen in the ED again for atypical chest pain under her sternum.  This recent episode was triggered by eating oranges.  She describes getting a GI cocktail in the ED and says that her chest pain resolved.  She was told to take an H2 blocker which she has.  This is helped some but she still has episodes of heartburn.  She has had no more episodes of chest pain and she denies odynophagia, dysphagia, shortness of breath, or abdominal pain.  Outpatient Medications Prior to Visit  Medication Sig Dispense Refill   amLODipine (NORVASC) 5 MG tablet Take 1 tablet (5 mg total) by mouth daily. 90 tablet 3   carvedilol (COREG) 6.25 MG tablet Take 1 tablet (6.25 mg total) by mouth 2 (two) times daily with a meal. 180 tablet 3   clopidogrel (PLAVIX) 75 MG tablet Take 1 tablet (75 mg total) by mouth daily with breakfast. 90 tablet 3   fluticasone (FLONASE) 50 MCG/ACT nasal spray Place 1 spray into both nostrils daily. 16 g 2   isosorbide mononitrate (IMDUR) 30 MG 24 hr tablet Take 1 tablet (30 mg total) by mouth daily. 90 tablet 3   levothyroxine (SYNTHROID) 88 MCG tablet TAKE 1 TABLET BY MOUTH EVERY DAY 90 tablet 1   linaclotide (LINZESS) 145 MCG CAPS capsule Take 1 capsule (145 mcg total) by mouth daily before breakfast. 90 capsule 1   losartan (COZAAR) 25 MG tablet Take 1 tablet (25 mg total) by mouth daily. 90 tablet 3   meclizine (ANTIVERT) 25 MG tablet Take 1 tablet (25 mg total) by mouth 2 (two)  times daily as needed for dizziness. 10 tablet 0   nitroGLYCERIN (NITROSTAT) 0.4 MG SL tablet Place 1 tablet (0.4 mg total) under the tongue every 5 (five) minutes as needed for chest pain. 25 tablet 3   rosuvastatin (CRESTOR) 10 MG tablet TAKE 1 TABLET BY MOUTH EVERY DAY 90 tablet 2   Vitamin D, Ergocalciferol, (DRISDOL) 1.25 MG (50000 UNIT) CAPS capsule TAKE 1 CAPSULE (50,000 UNITS TOTAL) BY MOUTH EVERY 7 (SEVEN) DAYS. 12 capsule 0   famotidine (PEPCID) 40 MG tablet Take 1 tablet (40 mg total) by mouth daily for 14 days. 14 tablet 0   No facility-administered medications prior to visit.    ROS Review of Systems  Constitutional: Negative for appetite change, diaphoresis, fatigue and unexpected weight change.  HENT: Negative.  Negative for sore throat, trouble swallowing and voice change.   Eyes: Negative.   Respiratory: Negative.  Negative for cough, choking, chest tightness, shortness of breath and wheezing.   Cardiovascular: Negative for chest pain, palpitations and leg swelling.  Gastrointestinal: Negative for abdominal pain, blood in stool, constipation, diarrhea, nausea and vomiting.  Endocrine: Negative.   Genitourinary: Negative.  Negative for difficulty urinating.  Musculoskeletal: Negative.   Skin: Negative.  Negative for color change.  Neurological: Negative.  Negative for dizziness, weakness and headaches.  Hematological: Negative for adenopathy. Does not bruise/bleed easily.    Objective:  BP 136/84  Pulse 86    Temp 97.8 F (36.6 C) (Oral)    Resp 16    Ht 5' 7"  (1.702 m)    Wt 178 lb (80.7 kg)    SpO2 99%    BMI 27.88 kg/m   BP Readings from Last 3 Encounters:  12/31/19 136/84  12/03/19 (!) 183/79  11/20/19 120/70    Wt Readings from Last 3 Encounters:  12/31/19 178 lb (80.7 kg)  11/20/19 184 lb (83.5 kg)  09/06/19 182 lb 12.8 oz (82.9 kg)    Physical Exam Vitals reviewed.  Constitutional:      Appearance: Normal appearance.  HENT:     Nose: Nose  normal.     Mouth/Throat:     Mouth: Mucous membranes are moist.  Eyes:     General: No scleral icterus.    Conjunctiva/sclera: Conjunctivae normal.  Cardiovascular:     Rate and Rhythm: Normal rate and regular rhythm.     Heart sounds: No murmur. No gallop.   Pulmonary:     Effort: Pulmonary effort is normal.     Breath sounds: No stridor. No wheezing, rhonchi or rales.  Abdominal:     General: Abdomen is flat. Bowel sounds are normal. There is no distension.     Palpations: Abdomen is soft. There is no hepatomegaly or splenomegaly.     Tenderness: There is no abdominal tenderness.     Hernia: No hernia is present.  Musculoskeletal:        General: Normal range of motion.     Cervical back: Neck supple.     Right lower leg: No edema.     Left lower leg: No edema.  Lymphadenopathy:     Cervical: No cervical adenopathy.  Skin:    General: Skin is warm and dry.     Coloration: Skin is not pale.  Neurological:     General: No focal deficit present.     Mental Status: She is alert.  Psychiatric:        Mood and Affect: Mood normal.        Behavior: Behavior normal.     Lab Results  Component Value Date   WBC 8.2 12/02/2019   HGB 13.8 12/02/2019   HCT 40.1 12/02/2019   PLT 291 12/02/2019   GLUCOSE 114 (H) 12/31/2019   CHOL 123 08/17/2019   TRIG 103 08/17/2019   HDL 47 08/17/2019   LDLDIRECT 88.0 10/19/2016   LDLCALC 55 08/17/2019   ALT 20 08/13/2019   AST 27 08/13/2019   NA 140 12/31/2019   K 3.7 12/31/2019   CL 103 12/31/2019   CREATININE 0.68 12/31/2019   BUN 8 12/31/2019   CO2 30 12/31/2019   TSH 1.06 12/31/2019   INR 1.01 05/08/2018   HGBA1C 6.2 12/31/2019    DG Chest 2 View  Result Date: 12/02/2019 CLINICAL DATA:  Chest pain EXAM: CHEST - 2 VIEW COMPARISON:  08/17/2019 FINDINGS: The heart size and mediastinal contours are within normal limits. Both lungs are clear. Mild degenerative changes of the spine. IMPRESSION: No active cardiopulmonary disease.  Electronically Signed   By: Donavan Foil M.D.   On: 12/02/2019 21:39    Assessment & Plan:   [name] was seen today for hypertension, gastroesophageal reflux and hypothyroidism.  Diagnoses and all orders for this visit:  Essential hypertension- Her blood pressure is adequately well controlled. -     Basic metabolic panel  Hypothyroidism, unspecified type- Her TSH is in the normal range.  She will  remain on the current dose of levothyroxine. -     TSH  Prediabetes- Her A1c is at 6.2%.  Medical therapy is not indicated. -     Hemoglobin A1c  Vitamin D deficiency- Her vitamin D level is normal now. -     VITAMIN D 25 Hydroxy (Vit-D Deficiency, Fractures)  Gastroesophageal reflux disease with esophagitis without hemorrhage- Since she has significant symptoms with this I recommended that she take a PPI as well as the H2 blocker. -     famotidine (PEPCID) 40 MG tablet; Take 1 tablet (40 mg total) by mouth daily. -     Dexlansoprazole 30 MG capsule; Take 1 capsule (30 mg total) by mouth daily.   I have changed [name]'s famotidine. I am also having her start on Dexlansoprazole. Additionally, I am having her maintain her fluticasone, linaclotide, clopidogrel, isosorbide mononitrate, losartan, rosuvastatin, meclizine, nitroGLYCERIN, amLODipine, carvedilol, levothyroxine, and Vitamin D (Ergocalciferol).  Meds ordered this encounter  Medications   famotidine (PEPCID) 40 MG tablet    Sig: Take 1 tablet (40 mg total) by mouth daily.    Dispense:  90 tablet    Refill:  1   Dexlansoprazole 30 MG capsule    Sig: Take 1 capsule (30 mg total) by mouth daily.    Dispense:  90 capsule    Refill:  1     Follow-up: Return in about 6 months (around 06/29/2020).  [name], MD

## 2020-01-01 ENCOUNTER — Telehealth: Payer: Self-pay | Admitting: Internal Medicine

## 2020-01-01 ENCOUNTER — Other Ambulatory Visit: Payer: Self-pay | Admitting: Internal Medicine

## 2020-01-01 DIAGNOSIS — K219 Gastro-esophageal reflux disease without esophagitis: Secondary | ICD-10-CM

## 2020-01-01 MED ORDER — PANTOPRAZOLE SODIUM 20 MG PO TBEC: 20.0000 mg | DELAYED_RELEASE_TABLET | Freq: Every day | ORAL | 1 refills | Status: DC

## 2020-01-01 NOTE — Telephone Encounter (Signed)
    Pt c/o medication issue:  1. Name of Medication: Dexlansoprazole 30 MG capsule  2. How are you currently taking this medication (dosage and times per day)? n/a  3. Are you having a reaction (difficulty breathing--STAT)? No  4. What is your medication issue? Patient states med too costly $391 for 90 day supply. Patient states she can not afford med. Please advise

## 2020-01-01 NOTE — Telephone Encounter (Signed)
Is there an alternative to the dexilant? Pt states medication costs too much.

## 2020-01-02 NOTE — Telephone Encounter (Signed)
Pt contacted and informed new rx for pepcid has been sent in.

## 2020-01-04 ENCOUNTER — Encounter: Payer: Self-pay | Admitting: Cardiology

## 2020-01-04 ENCOUNTER — Other Ambulatory Visit: Payer: Self-pay

## 2020-01-04 ENCOUNTER — Ambulatory Visit (INDEPENDENT_AMBULATORY_CARE_PROVIDER_SITE_OTHER): Payer: Medicare Other | Admitting: Cardiology

## 2020-01-04 VITALS — BP 110/66 | HR 86 | Ht 67.0 in | Wt 178.0 lb

## 2020-01-04 DIAGNOSIS — I10 Essential (primary) hypertension: Secondary | ICD-10-CM

## 2020-01-04 DIAGNOSIS — E785 Hyperlipidemia, unspecified: Secondary | ICD-10-CM

## 2020-01-04 DIAGNOSIS — I25118 Atherosclerotic heart disease of native coronary artery with other forms of angina pectoris: Secondary | ICD-10-CM

## 2020-01-04 DIAGNOSIS — R0789 Other chest pain: Secondary | ICD-10-CM

## 2020-01-04 NOTE — Progress Notes (Signed)
Cardiology Office Note:    Date:  01/04/2020   ID:  [name], DOB 1950/12/30, MRN [id number]  PCP:  [name], MD  Cardiologist:  [name], MD  Referring MD: [name], MD   Chief Complaint  Patient presents with  . Hospitalization Follow-up  . Chest Pain  . Coronary Artery Disease    History of Present Illness:    [name] is a 69 y.o. female with a past medical history significant for ACS/Takotsubo CM 05/11/2018 w/ D2 70% (small vessel) and EF 45%, HTN, HLD, hypothyroid, GERD, fibromyalgia, NASH, obesity.   She had an ER visit on 08/04/19 for headache and dizziness associated with poorly controlled hypertension with blood pressure 178/82.  The ER MD suspected possible migraine, or mild dehydration.  She felt better after migraine cocktail and was discharged.  On 9/14 at PCP visit, BP 184/96, indapamide 1.25 mg daily was added.  The patient was then admitted to the hospital 08/17/2019 with complaints of chest pain.  She ruled out for MI by enzymes.  EKG without acute ischemia.  Echocardiogram showed normal LV systolic function, actually hyperdynamic, with EF 70-75%.  There was elevated LV end-diastolic pressure.  No LVH.  Her blood pressure continued to be elevated.  Metoprolol was switched to carvedilol and indapamide was discontinued in favor of amlodipine for its anti-vasospastic action.  Ms. Beshara was seen in the ED on 12/01/2018 with complaints of severe chest pain.  Enzymes were negative and EKG was nonischemic.  Her symptoms were felt to be atypical and were greatly improved with GI cocktail.  She was started on Pepcid for presumed GERD and able to be discharged home.  She is here today for follow-up of that ED visit. She missed her appt on 12/10/19. She is a little anxious as she says she has been taking her medications wrong. She was not taking her carvedilol at all until 1 week ago and she was taking her losartan twice a day.  Now she thinks she has it  straightened out. I had her call back after the office visit to review all her meds and they are correct.   She reports that she tested  positive for COVID 19 on 12/10/2019 through [place]. She had lost her sense of taste and smell and had chills. She was followed by remote health and quarantined since then (20 days). She thinks she got COVID at the ED visit.   Today she feels well. She denies chest pain/pressure, shortness of breath, orthopnea, PND, palpitations, lightheadedness or syncope. She has trace ankle puffiness.   Cardiac studies   Echocardiogram 08/17/2019 IMPRESSIONS  1. Left ventricular ejection fraction, by visual estimation, is 70 to 75%. The left ventricle has hyperdynamic function. Normal left ventricular size. There is no left ventricular hypertrophy. 2. Elevated left ventricular end-diastolic pressure. 3. Left ventricular diastolic Doppler parameters are consistent with impaired relaxation pattern of LV diastolic filling. 4. Global right ventricle has normal systolic function.The right ventricular size is normal. No increase in right ventricular wall thickness. 5. Left atrial size was normal. 6. Right atrial size was normal. 7. The mitral valve is normal in structure. No evidence of mitral valve regurgitation. No evidence of mitral stenosis. 8. The tricuspid valve is normal in structure. Tricuspid valve regurgitation was not visualized by color flow Doppler. 9. The aortic valve is normal in structure. Aortic valve regurgitation was not visualized by color flow Doppler. Structurally normal aortic valve, with no evidence of sclerosis  or stenosis. 10. The pulmonic valve was normal in structure. Pulmonic valve regurgitation is not visualized by color flow Doppler. 11. The inferior vena cava is normal in size with greater than 50% respiratory variability, suggesting right atrial pressure of 3 mmHg.  ECHO: 09/12/2018 - Left ventricle: The cavity size was normal. There  was mild concentric hypertrophy. Systolic function was normal. The estimated ejection fraction was in the range of 55% to 60%. Wall motion was normal; there were no regional wall motion abnormalities. Doppler parameters are consistent with indetermiante left ventricular relaxation (grade 1 diastolic dysfunction). Doppler parameters are consistent with indeterminate ventricular filling pressure. - Aortic valve: Transvalvular velocity was within the normal range. There was no stenosis. There was no regurgitation. - Mitral valve: Transvalvular velocity was within the normal range. There was no evidence for stenosis. There was no regurgitation. - Right ventricle: The cavity size was normal. Wall thickness was normal. Systolic function was normal. - Atrial septum: No defect or patent foramen ovale was identified by color flow Doppler. - Tricuspid valve: There was trivial regurgitation. - Pulmonary arteries: Systolic pressure was within the normal range. PA peak pressure: 20 mm Hg (S). - Global longitudinal strain -20.9% (normal).  CATH:Urgent left cardiac catheterization6/10/19 Conclusion    Acute coronary syndrome with anterior wall motion abnormality and essentially widely patent coronary arteries. Suspect variant stress cardiomyopathy. (Cannot totally exclude transient occlusion and recanalization of the first diagonal which is relatively small and has moderate somewhat hazy disease in the mid segment up to 70%.)  Normal left main  LAD is large, wraps around the left ventricular apex, and gives origin to 3 diagonal branches, the second of which is discussed above with mid vessel 70% narrowing in an artery that is less than 2 mm in diameter. Beyond that there is either a small branch or occlusion of the branch. Images are ambiguous.  Circumflex is large, first marginal and second marginal widely patent. Second marginal bifurcates.  Right coronary is  tortuous but widely patent.  Abnormal left ventricular wall motion with mid to distal anterior wall akinesis, EF 45%, and elevated LVEDP consistent with acute combined systolic and diastolic heart failure.  RECOMMENDATIONS:   Risk factor modification: Blood pressure 130/80 mmHg or less, LDL cholesterol less than 70, check A1c, and encourage exercise when appropriate.  Add low-dose beta-blocker  IV nitroglycerin and IV heparin x24 hours then wean.  2D Doppler echocardiogram tomorrow.  Diagnostic Dominance: Right    ECHO6/12/19 Study Conclusions - Left ventricle: The cavity size was normal. There was mild concentric hypertrophy. Systolic function was mildly reduced. The estimated ejection fraction was in the range of 45% to 50%. Hypokinesis of the apicalanterior and apical myocardium; consistent with ischemia in the distribution of the left anterior descending coronary artery. Doppler parameters are consistent with abnormal left ventricular relaxation (grade 1 diastolic dysfunction).     Past Medical History:  Diagnosis Date  . Anemia   . Arthritis   . Cholelithiasis   . Chronic back pain   . Constipation   . Esophageal reflux   . Fibromyalgia   . Hyperlipidemia   . Hypertension   . Hypothyroid   . IBS (irritable bowel syndrome)   . Internal hemorrhoids without mention of complication   . Left leg pain   . Personal history of colonic polyps 05/06/2010   ADENOMATOUS POLYP    Past Surgical History:  Procedure Laterality Date  . ABDOMINAL HYSTERECTOMY    . CERVICAL DISC SURGERY  PLATE IN NECK & BACK  . CHOLECYSTECTOMY    . COLONOSCOPY  09/27/2011   NORMAL   . LEFT HEART CATH AND CORONARY ANGIOGRAPHY N/A 05/08/2018   Procedure: LEFT HEART CATH AND CORONARY ANGIOGRAPHY;  Surgeon: Belva Crome, MD;  Location: [facility];  Service: Cardiovascular;  Laterality: N/A;  . LUMBAR [place]    . TONSILLECTOMY      Current  Medications: Current Meds  Medication Sig  . amLODipine (NORVASC) 5 MG tablet Take 1 tablet (5 mg total) by mouth daily.  . carvedilol (COREG) 6.25 MG tablet Take 1 tablet (6.25 mg total) by mouth 2 (two) times daily with a meal.  . clopidogrel (PLAVIX) 75 MG tablet Take 1 tablet (75 mg total) by mouth daily with breakfast.  . famotidine (PEPCID) 40 MG tablet Take 1 tablet (40 mg total) by mouth daily.  . fluticasone (FLONASE) 50 MCG/ACT nasal spray Place 1 spray into both nostrils daily.  . isosorbide mononitrate (IMDUR) 30 MG 24 hr tablet Take 1 tablet (30 mg total) by mouth daily.  Marland Kitchen levothyroxine (SYNTHROID) 88 MCG tablet TAKE 1 TABLET BY MOUTH EVERY DAY  . linaclotide (LINZESS) 145 MCG CAPS capsule Take 1 capsule (145 mcg total) by mouth daily before breakfast.  . losartan (COZAAR) 25 MG tablet Take 1 tablet (25 mg total) by mouth daily.  . meclizine (ANTIVERT) 25 MG tablet Take 1 tablet (25 mg total) by mouth 2 (two) times daily as needed for dizziness.  . nitroGLYCERIN (NITROSTAT) 0.4 MG SL tablet Place 1 tablet (0.4 mg total) under the tongue every 5 (five) minutes as needed for chest pain.  . pantoprazole (PROTONIX) 20 MG tablet Take 1 tablet (20 mg total) by mouth daily.  . rosuvastatin (CRESTOR) 10 MG tablet TAKE 1 TABLET BY MOUTH EVERY DAY  . Vitamin D, Ergocalciferol, (DRISDOL) 1.25 MG (50000 UNIT) CAPS capsule TAKE 1 CAPSULE (50,000 UNITS TOTAL) BY MOUTH EVERY 7 (SEVEN) DAYS.     Allergies:   Aspirin, Gadolinium, Iohexol, Livalo [pitavastatin], Hydrocodone-acetaminophen, and Lipitor [atorvastatin]   Social History   Socioeconomic History  . Marital status: Widowed    Spouse name: Not on file  . Number of children: 2  . Years of education: Not on file  . Highest education level: Not on file  Occupational History  . Occupation: housewife    Employer: UNEMPLOYED   Tobacco Use  . Smoking status: Never Smoker  . Smokeless tobacco: Never Used  Substance and Sexual Activity    . Alcohol use: No    Alcohol/week: 0.0 standard drinks  . Drug use: No  . Sexual activity: Not Currently  Other Topics Concern  . Not on file  Social History Narrative   Daily Caffeine Use   Social Determinants of Health   Financial Resource Strain:   . Difficulty of Paying Living Expenses: Not on file  Food Insecurity:   . Worried About Charity fundraiser in the Last Year: Not on file  . Ran Out of Food in the Last Year: Not on file  Transportation Needs:   . Lack of Transportation (Medical): Not on file  . Lack of Transportation (Non-Medical): Not on file  Physical Activity: Inactive  . Days of Exercise per Week: 0 days  . Minutes of Exercise per Session: 0 min  Stress:   . Feeling of Stress : Not on file  Social Connections:   . Frequency of Communication with Friends and Family: Not on file  .  Frequency of Social Gatherings with Friends and Family: Not on file  . Attends Religious Services: Not on file  . Active Member of Clubs or Organizations: Not on file  . Attends Archivist Meetings: Not on file  . Marital Status: Not on file     Family History: The patient's family history includes Breast cancer in her cousin and maternal aunt; COPD in her mother; Dementia in her mother; Diabetes in her mother; Heart failure in her father; Hypertension in her mother; Kidney disease in her mother; Lung disease in her mother. There is no history of Colon cancer. ROS:   Please see the history of present illness.     All other systems reviewed and are negative.   EKG:  EKG is not ordered today.   Recent Labs: 05/21/2019: Magnesium 1.9 08/13/2019: ALT 20 12/02/2019: Hemoglobin 13.8; Platelets 291 12/31/2019: BUN 8; Creatinine, Ser 0.68; Potassium 3.7; Sodium 140; TSH 1.06   Recent Lipid Panel    Component Value Date/Time   CHOL 123 08/17/2019 0747   CHOL 109 06/27/2018 0812   TRIG 103 08/17/2019 0747   HDL 47 08/17/2019 0747   HDL 43 06/27/2018 0812   CHOLHDL 2.6  08/17/2019 0747   VLDL 21 08/17/2019 0747   LDLCALC 55 08/17/2019 0747   LDLCALC 43 06/27/2018 0812   LDLDIRECT 88.0 10/19/2016 0844    Physical Exam:    VS:  BP 110/66   Pulse 86   Ht 5' 7"  (1.702 m)   Wt 178 lb (80.7 kg)   SpO2 94%   BMI 27.88 kg/m     Wt Readings from Last 6 Encounters:  01/04/20 178 lb (80.7 kg)  12/31/19 178 lb (80.7 kg)  11/20/19 184 lb (83.5 kg)  09/06/19 182 lb 12.8 oz (82.9 kg)  09/03/19 184 lb 4 oz (83.6 kg)  08/28/19 186 lb (84.4 kg)     Physical Exam  Constitutional: She is oriented to person, place, and time. She appears well-developed and well-nourished. No distress.  HENT:  Head: Normocephalic and atraumatic.  Neck: No JVD present. No tracheal deviation present. No thyromegaly present.  Cardiovascular: Normal rate, regular rhythm, normal heart sounds and intact distal pulses. Exam reveals no gallop and no friction rub.  No murmur heard. Pulmonary/Chest: Effort normal and breath sounds normal. No respiratory distress. She has no wheezes. She has no rales.  Abdominal: Soft. Bowel sounds are normal.  Musculoskeletal:        General: No edema. Normal range of motion.     Cervical back: Normal range of motion and neck supple.  Neurological: She is alert and oriented to person, place, and time.  Skin: Skin is warm and dry.  Psychiatric: She has a normal mood and affect. Her behavior is normal. Judgment and thought content normal.  Vitals reviewed.    ASSESSMENT:    1. Other chest pain   2. Coronary artery disease of native artery of native heart with stable angina pectoris ([place])   3. Essential (primary) hypertension   4. Hyperlipidemia LDL goal <70    PLAN:    In order of problems listed above:  Chest pain -Patient presented to the ED with chest pain on 12/02/2019.  Enzymes were negative and EKG nonischemic.  Symptoms resolved with GI cocktail.  Patient was started on Pepcid for treatment of presumed GERD. -No further chest pain. She  says was related to reflux.   CAD -History of small D2 mid 70% and occluded distally by cath 04/2018,  medical therapy only option since less than 2 mm diameter -Seen at the hospital 08/17/2019 for chest pain.  Echocardiogram with no wall motion abnormalities, normal EF.  Ruled out for MI by EKG and enzymes.  Medication changes for better blood pressure control and amlodipine for possible coronary vasospasm. -Continues on Imdur, Plavix, beta-blocker, statin. I reviewed her medications and instructions for taking them in depth and provided notes on her med list telling her when to take them and what each med is for. She was very Patent attorney.   Hypertension -On amlodipine 5 mg, carvedilol 6.25 mg, Imdur 30 mg, losartan 25 mg -BP well controlled today.   Hyperlipidemia -On rosuvastatin 10 mg daily.  LDL is 55 07/2019, at goal of less than 70.  Continue current therapy.   Medication Adjustments/Labs and Tests Ordered: Current medicines are reviewed at length with the patient today.  Concerns regarding medicines are outlined above. Labs and tests ordered and medication changes are outlined in the patient instructions below:  Patient Instructions  Medication Instructions:  Your physician recommends that you continue on your current medications as directed. Please refer to the Current Medication list given to you today.  *If you need a refill on your cardiac medications before your next appointment, please call your pharmacy*  Lab Work: None ordered  If you have labs (blood work) drawn today and your tests are completely normal, you will receive your results only by: Marland Kitchen MyChart Message (if you have MyChart) OR . A paper copy in the mail If you have any lab test that is abnormal or we need to change your treatment, we will call you to review the results.  Testing/Procedures: None ordered   Follow-Up: At [facility], you and your health needs are our priority.  As part of our continuing  mission to provide you with exceptional heart care, we have created designated Provider Care Teams.  These Care Teams include your primary Cardiologist (physician) and Advanced Practice Providers (APPs -  Physician Assistants and Nurse Practitioners) who all work together to provide you with the care you need, when you need it.  Your next appointment:   6 month(s)  The format for your next appointment:   In Person  Provider:   You may see [name], MD or one of the following Advanced Practice Providers on your designated Care Team:    Richardson Dopp, PA-C  [place], [place]  Daune Perch, NP   Other Instructions None      Signed, Daune Perch, NP  01/04/2020 10:31 AM    [place]

## 2020-01-04 NOTE — Patient Instructions (Signed)
Medication Instructions:  Your physician recommends that you continue on your current medications as directed. Please refer to the Current Medication list given to you today.  *If you need a refill on your cardiac medications before your next appointment, please call your pharmacy*  Lab Work: None ordered  If you have labs (blood work) drawn today and your tests are completely normal, you will receive your results only by: Marland Kitchen MyChart Message (if you have MyChart) OR . A paper copy in the mail If you have any lab test that is abnormal or we need to change your treatment, we will call you to review the results.  Testing/Procedures: None ordered   Follow-Up: At [facility], you and your health needs are our priority.  As part of our continuing mission to provide you with exceptional heart care, we have created designated Provider Care Teams.  These Care Teams include your primary Cardiologist (physician) and Advanced Practice Providers (APPs -  Physician Assistants and Nurse Practitioners) who all work together to provide you with the care you need, when you need it.  Your next appointment:   6 month(s)  The format for your next appointment:   In Person  Provider:   You may see [name], MD or one of the following Advanced Practice Providers on your designated Care Team:    Richardson Dopp, PA-C  [place], [place]  Daune Perch, NP   Other Instructions None

## 2020-01-28 NOTE — Progress Notes (Signed)
[facility] [address] [place] Phone: [phone] Subjective:   I [name] am serving as a Education administrator for Dr. Hulan [name].  This visit occurred during the SARS-CoV-2 public health emergency.  Safety protocols were in place, including screening questions prior to the visit, additional usage of staff PPE, and extensive cleaning of exam room while observing appropriate contact time as indicated for disinfecting solutions.    I'm seeing this patient by the request  of:  Emily Lima, MD  CC: Left knee pain follow-up  GEX:[id number]   11/20/2019 Injection given again today.  Tolerated the procedure well.  Could be candidate for viscosupplementation but will hold at this time.  Continue to encourage bracing, home exercises, icing regimen follow-up again in 10 weeks  Update 01/29/2020 [name] is a 69 y.o. female coming in with complaint of left knee pain. Patient states she feels alright. Knee acts up at times.      Known arthritic changes of the knee.  Last injection was November 20, 2019  Past Medical History:  Diagnosis Date  . Anemia   . Arthritis   . Cholelithiasis   . Chronic back pain   . Constipation   . Esophageal reflux   . Fibromyalgia   . Hyperlipidemia   . Hypertension   . Hypothyroid   . IBS (irritable bowel syndrome)   . Internal hemorrhoids without mention of complication   . Left leg pain   . Personal history of colonic polyps 05/06/2010   ADENOMATOUS POLYP   Past Surgical History:  Procedure Laterality Date  . ABDOMINAL HYSTERECTOMY    . CERVICAL DISC SURGERY     PLATE IN NECK & BACK  . CHOLECYSTECTOMY    . COLONOSCOPY  09/27/2011   NORMAL   . LEFT HEART CATH AND CORONARY ANGIOGRAPHY N/A 05/08/2018   Procedure: LEFT HEART CATH AND CORONARY ANGIOGRAPHY;  Surgeon: Belva Crome, MD;  Location: [facility];  Service: Cardiovascular;  Laterality: N/A;  . LUMBAR [place]    .  TONSILLECTOMY     Social History   Socioeconomic History  . Marital status: Widowed    Spouse name: Not on file  . Number of children: 2  . Years of education: Not on file  . Highest education level: Not on file  Occupational History  . Occupation: housewife    Employer: UNEMPLOYED   Tobacco Use  . Smoking status: Never Smoker  . Smokeless tobacco: Never Used  Substance and Sexual Activity  . Alcohol use: No    Alcohol/week: 0.0 standard drinks  . Drug use: No  . Sexual activity: Not Currently  Other Topics Concern  . Not on file  Social History Narrative   Daily Caffeine Use   Social Determinants of Health   Financial Resource Strain:   . Difficulty of Paying Living Expenses: Not on file  Food Insecurity:   . Worried About Charity fundraiser in the Last Year: Not on file  . Ran Out of Food in the Last Year: Not on file  Transportation Needs:   . Lack of Transportation (Medical): Not on file  . Lack of Transportation (Non-Medical): Not on file  Physical Activity: Inactive  . Days of Exercise per Week: 0 days  . Minutes of Exercise per Session: 0 min  Stress:   . Feeling of Stress : Not on file  Social Connections:   . Frequency of Communication with Friends and  Family: Not on file  . Frequency of Social Gatherings with Friends and Family: Not on file  . Attends Religious Services: Not on file  . Active Member of Clubs or Organizations: Not on file  . Attends Archivist Meetings: Not on file  . Marital Status: Not on file   Allergies  Allergen Reactions  . Aspirin Other (See Comments)    unknown  . Gadolinium Hives    patient unsure of which kind of dye she had a reaction to until reaction to Magnevist - may be allergic to x-ray contrast, Onset Date: [id number]   . Iohexol Hives    patient unsure of which kind of dye she had a reaction to until reaction to Magnevist - may be allergic to x-ray contrast, Onset Date: [id number]  . Livalo [Pitavastatin]  Other (See Comments)    myalgias  . Hydrocodone-Acetaminophen Other (See Comments)  . Lipitor [Atorvastatin] Other (See Comments)    Myalgias   Family History  Problem Relation Age of Onset  . Diabetes Mother        aunts and uncles  . Kidney disease Mother        stage 3  . Lung disease Mother        colapsed lung/in hospice  . COPD Mother   . Hypertension Mother   . Dementia Mother   . Breast cancer Maternal Aunt   . Heart failure Father        Mother  . Breast cancer Cousin   . Colon cancer Neg Hx     Current Outpatient Medications (Endocrine & Metabolic):  .  levothyroxine (SYNTHROID) 88 MCG tablet, TAKE 1 TABLET BY MOUTH EVERY DAY  Current Outpatient Medications (Cardiovascular):  .  amLODipine (NORVASC) 5 MG tablet, Take 1 tablet (5 mg total) by mouth daily. .  carvedilol (COREG) 6.25 MG tablet, Take 1 tablet (6.25 mg total) by mouth 2 (two) times daily with a meal. .  isosorbide mononitrate (IMDUR) 30 MG 24 hr tablet, Take 1 tablet (30 mg total) by mouth daily. Marland Kitchen  losartan (COZAAR) 25 MG tablet, Take 1 tablet (25 mg total) by mouth daily. .  nitroGLYCERIN (NITROSTAT) 0.4 MG SL tablet, Place 1 tablet (0.4 mg total) under the tongue every 5 (five) minutes as needed for chest pain. .  rosuvastatin (CRESTOR) 10 MG tablet, TAKE 1 TABLET BY MOUTH EVERY DAY  Current Outpatient Medications (Respiratory):  .  fluticasone (FLONASE) 50 MCG/ACT nasal spray, Place 1 spray into both nostrils daily.   Current Outpatient Medications (Hematological):  .  clopidogrel (PLAVIX) 75 MG tablet, Take 1 tablet (75 mg total) by mouth daily with breakfast.  Current Outpatient Medications (Other):  .  famotidine (PEPCID) 40 MG tablet, Take 1 tablet (40 mg total) by mouth daily. Marland Kitchen  linaclotide (LINZESS) 145 MCG CAPS capsule, Take 1 capsule (145 mcg total) by mouth daily before breakfast. .  meclizine (ANTIVERT) 25 MG tablet, Take 1 tablet (25 mg total) by mouth 2 (two) times daily as needed  for dizziness. .  pantoprazole (PROTONIX) 20 MG tablet, Take 1 tablet (20 mg total) by mouth daily. .  Vitamin D, Ergocalciferol, (DRISDOL) 1.25 MG (50000 UNIT) CAPS capsule, TAKE 1 CAPSULE (50,000 UNITS TOTAL) BY MOUTH EVERY 7 (SEVEN) DAYS.   Reviewed prior external information including notes and imaging from  primary care provider As well as notes that were available from care everywhere and other healthcare systems.  Past medical history, social, surgical and family history all reviewed  in electronic medical record.  No pertanent information unless stated regarding to the chief complaint.   Review of Systems:  No headache, visual changes, nausea, vomiting, diarrhea, constipation, dizziness, abdominal pain, skin rash, fevers, chills, night sweats, weight loss, swollen lymph nodes, body aches, joint swelling, chest pain, shortness of breath, mood changes. POSITIVE muscle aches  Objective  Blood pressure 128/70, pulse 65, height 5' 7"  (1.702 m), weight 180 lb (81.6 kg), SpO2 96 %.   General: No apparent distress alert and oriented x3 mood and affect normal, dressed appropriately.  HEENT: Pupils equal, extraocular movements intact  Respiratory: Patient's speak in full sentences and does not appear short of breath  Cardiovascular: No lower extremity edema, non tender, no erythema  Skin: Warm dry intact with no signs of infection or rash on extremities or on axial skeleton.  Abdomen: Soft nontender  Neuro: Cranial nerves II through XII are intact, neurovascularly intact in all extremities with 2+ DTRs and 2+ pulses.  Lymph: No lymphadenopathy of posterior or anterior cervical chain or axillae bilaterally.  Gait n minimal antalgic MSK:  Non tender with full range of motion and good stability and symmetric strength and tone of shoulders, elbows, wrist, hip and ankles bilaterally.  Knee: Left valgus deformity noted. Large thigh to calf ratio.  Tender to palpation over medial and PF joint  line.  ROM full in flexion and extension and lower leg rotation. instability with valgus force.  painful patellar compression. Patellar glide with moderate crepitus. Patellar and quadriceps tendons unremarkable. Hamstring and quadriceps strength is normal. Contralateral knee shows minimal arthritic changes    Impression and Recommendations:      The above documentation has been reviewed and is accurate and complete Lyndal Pulley, DO       Note: This dictation was prepared with Dragon dictation along with smaller phrase technology. Any transcriptional errors that result from this process are unintentional.

## 2020-01-29 ENCOUNTER — Other Ambulatory Visit: Payer: Self-pay

## 2020-01-29 ENCOUNTER — Encounter: Payer: Self-pay | Admitting: Family Medicine

## 2020-01-29 ENCOUNTER — Ambulatory Visit (INDEPENDENT_AMBULATORY_CARE_PROVIDER_SITE_OTHER): Payer: Medicare Other | Admitting: Family Medicine

## 2020-01-29 DIAGNOSIS — M1712 Unilateral primary osteoarthritis, left knee: Secondary | ICD-10-CM

## 2020-01-29 NOTE — Assessment & Plan Note (Signed)
Chronic problem but doing very well at the moment.  Stable.  No significant changes in medical management.  Follow-up with me again in 6 weeks

## 2020-01-29 NOTE — Patient Instructions (Signed)
Good to see you Zinc 30 mg daily for a month You did well because you take care of yourself See me again in 6 weeks

## 2020-02-07 ENCOUNTER — Encounter: Payer: Self-pay | Admitting: Physician Assistant

## 2020-02-07 ENCOUNTER — Ambulatory Visit (INDEPENDENT_AMBULATORY_CARE_PROVIDER_SITE_OTHER): Payer: Medicare Other | Admitting: Physician Assistant

## 2020-02-07 VITALS — BP 124/82 | HR 68 | Temp 98.5°F | Ht 67.0 in | Wt 179.5 lb

## 2020-02-07 DIAGNOSIS — Z7901 Long term (current) use of anticoagulants: Secondary | ICD-10-CM

## 2020-02-07 DIAGNOSIS — Z01818 Encounter for other preprocedural examination: Secondary | ICD-10-CM | POA: Diagnosis not present

## 2020-02-07 DIAGNOSIS — Z86010 Personal history of colonic polyps: Secondary | ICD-10-CM | POA: Diagnosis not present

## 2020-02-07 DIAGNOSIS — K219 Gastro-esophageal reflux disease without esophagitis: Secondary | ICD-10-CM | POA: Diagnosis not present

## 2020-02-07 MED ORDER — NA SULFATE-K SULFATE-MG SULF 17.5-3.13-1.6 GM/177ML PO SOLN: 1.0000 | Freq: Once | ORAL | 0 refills | Status: AC

## 2020-02-07 NOTE — Progress Notes (Signed)
Chief Complaint: History of colon polyps  HPI:    [name] is a 69 year old AA female, known to Dr. Hilarie Fredrickson, with a past medical history as listed below including IBS and fibromyalgia on chronic anticoagulation with Plavix (08/17/2019 echo with EF 70-75%), who was referred to me by [name], MD for history of colon polyps.    01/03/2017 colonoscopy for surveillance given her personal history of adenomatous polyps on a colonoscopy 5 years prior, with findings of one 6 mm polyp in the cecum, one 15 mm polyp in the ascending colon, one 5 mm polyp in the descending colon and internal hemorrhoids.  Pathology showed tubular adenomas.  Repeat recommended in 3 years.    Today, the patient presents to clinic and explains that in June 2019 she had a heart attack and was put on Plavix.  The only other thing since that is that she had Covid in January.  Tells me she has also had some increase in reflux symptoms but this is now under control because I'm on "two pills".  (She is on Pepcid 40 mg daily and Pantoprazole 20 mg daily).  Denies any other GI complaints or concerns.     Denies fever, chills, weight loss, change in bowel habits or abdominal pain.  Past Medical History:  Diagnosis Date  . Anemia   . Arthritis   . Cholelithiasis   . Chronic back pain   . Constipation   . Esophageal reflux   . Fibromyalgia   . Hyperlipidemia   . Hypertension   . Hypothyroid   . IBS (irritable bowel syndrome)   . Internal hemorrhoids without mention of complication   . Left leg pain   . Personal history of colonic polyps 05/06/2010   ADENOMATOUS POLYP    Past Surgical History:  Procedure Laterality Date  . ABDOMINAL HYSTERECTOMY    . CERVICAL DISC SURGERY     PLATE IN NECK & BACK  . CHOLECYSTECTOMY    . COLONOSCOPY  09/27/2011   NORMAL   . LEFT HEART CATH AND CORONARY ANGIOGRAPHY N/A 05/08/2018   Procedure: LEFT HEART CATH AND CORONARY ANGIOGRAPHY;  Surgeon: Belva Crome, MD;  Location: [facility];  Service: Cardiovascular;  Laterality: N/A;  . LUMBAR [place]    . TONSILLECTOMY      Current Outpatient Medications  Medication Sig Dispense Refill  . amLODipine (NORVASC) 5 MG tablet Take 1 tablet (5 mg total) by mouth daily. 90 tablet 3  . carvedilol (COREG) 6.25 MG tablet Take 1 tablet (6.25 mg total) by mouth 2 (two) times daily with a meal. 180 tablet 3  . clopidogrel (PLAVIX) 75 MG tablet Take 1 tablet (75 mg total) by mouth daily with breakfast. 90 tablet 3  . famotidine (PEPCID) 40 MG tablet Take 1 tablet (40 mg total) by mouth daily. 90 tablet 1  . fluticasone (FLONASE) 50 MCG/ACT nasal spray Place 1 spray into both nostrils daily. 16 g 2  . isosorbide mononitrate (IMDUR) 30 MG 24 hr tablet Take 1 tablet (30 mg total) by mouth daily. 90 tablet 3  . levothyroxine (SYNTHROID) 88 MCG tablet TAKE 1 TABLET BY MOUTH EVERY DAY 90 tablet 1  . linaclotide (LINZESS) 145 MCG CAPS capsule Take 1 capsule (145 mcg total) by mouth daily before breakfast. 90 capsule 1  . losartan (COZAAR) 25 MG tablet Take 1 tablet (25 mg total) by mouth daily. 90 tablet 3  . meclizine (ANTIVERT) 25 MG tablet Take 1 tablet (  25 mg total) by mouth 2 (two) times daily as needed for dizziness. 10 tablet 0  . nitroGLYCERIN (NITROSTAT) 0.4 MG SL tablet Place 1 tablet (0.4 mg total) under the tongue every 5 (five) minutes as needed for chest pain. 25 tablet 3  . pantoprazole (PROTONIX) 20 MG tablet Take 1 tablet (20 mg total) by mouth daily. 90 tablet 1  . rosuvastatin (CRESTOR) 10 MG tablet TAKE 1 TABLET BY MOUTH EVERY DAY 90 tablet 2  . Vitamin D, Ergocalciferol, (DRISDOL) 1.25 MG (50000 UNIT) CAPS capsule TAKE 1 CAPSULE (50,000 UNITS TOTAL) BY MOUTH EVERY 7 (SEVEN) DAYS. 12 capsule 0   No current facility-administered medications for this visit.    Allergies as of 02/07/2020 - Review Complete 01/29/2020  Allergen Reaction Noted  . Aspirin Other (See Comments)   . Gadolinium Hives 03/26/2007    . Iohexol Hives 03/26/2007  . Livalo [pitavastatin] Other (See Comments) 06/14/2012  . Hydrocodone-acetaminophen Other (See Comments)   . Lipitor [atorvastatin] Other (See Comments) 12/11/2009    Family History  Problem Relation Age of Onset  . Diabetes Mother        aunts and uncles  . Kidney disease Mother        stage 3  . Lung disease Mother        colapsed lung/in hospice  . COPD Mother   . Hypertension Mother   . Dementia Mother   . Breast cancer Maternal Aunt   . Heart failure Father        Mother  . Breast cancer Cousin   . Colon cancer Neg Hx     Social History   Socioeconomic History  . Marital status: Widowed    Spouse name: Not on file  . Number of children: 2  . Years of education: Not on file  . Highest education level: Not on file  Occupational History  . Occupation: housewife    Employer: UNEMPLOYED   Tobacco Use  . Smoking status: Never Smoker  . Smokeless tobacco: Never Used  Substance and Sexual Activity  . Alcohol use: No    Alcohol/week: 0.0 standard drinks  . Drug use: No  . Sexual activity: Not Currently  Other Topics Concern  . Not on file  Social History Narrative   Daily Caffeine Use   Social Determinants of Health   Financial Resource Strain:   . Difficulty of Paying Living Expenses:   Food Insecurity:   . Worried About Charity fundraiser in the Last Year:   . Arboriculturist in the Last Year:   Transportation Needs:   . Film/video editor (Medical):   Marland Kitchen Lack of Transportation (Non-Medical):   Physical Activity: Inactive  . Days of Exercise per Week: 0 days  . Minutes of Exercise per Session: 0 min  Stress:   . Feeling of Stress :   Social Connections:   . Frequency of Communication with Friends and Family:   . Frequency of Social Gatherings with Friends and Family:   . Attends Religious Services:   . Active Member of Clubs or Organizations:   . Attends Archivist Meetings:   Marland Kitchen Marital Status:   Intimate  Partner Violence:   . Fear of Current or Ex-Partner:   . Emotionally Abused:   Marland Kitchen Physically Abused:   . Sexually Abused:     Review of Systems:    Constitutional: No weight loss, fever or chills Skin: No rash Cardiovascular: No chest pain Respiratory: No SOB  Gastrointestinal: See HPI and otherwise negative Genitourinary: No dysuria  Neurological: No headache, dizziness or syncope Musculoskeletal: No new muscle or joint pain Hematologic: No bleeding Psychiatric: No history of depression or anxiety   Physical Exam:  Vital signs: BP 124/82 (BP Location: Left Arm, Patient Position: Sitting, Cuff Size: Normal)   Pulse 68   Temp 98.5 F (36.9 C) (Oral)   Ht 5' 7"  (1.702 m)   Wt 179 lb 8 oz (81.4 kg)   BMI 28.11 kg/m   Constitutional:   Pleasant AA female appears to be in NAD, Well developed, Well nourished, alert and cooperative Head:  Normocephalic and atraumatic. Eyes:   PEERL, EOMI. No icterus. Conjunctiva pink. Ears:  Normal auditory acuity. Neck:  Supple Throat: Oral cavity and pharynx without inflammation, swelling or lesion.  Respiratory: Respirations even and unlabored. Lungs clear to auscultation bilaterally.   No wheezes, crackles, or rhonchi.  Cardiovascular: Normal S1, S2. No MRG. Regular rate and rhythm. No peripheral edema, cyanosis or pallor.  Gastrointestinal:  Soft, nondistended, nontender. No rebound or guarding. Normal bowel sounds. No appreciable masses or hepatomegaly. Rectal:  Not performed.  Msk:  Symmetrical without gross deformities. Without edema, no deformity or joint abnormality.  Neurologic:  Alert and  oriented x4;  grossly normal neurologically.  Skin:   Dry and intact without significant lesions or rashes. Psychiatric: Demonstrates good judgement and reason without abnormal affect or behaviors.  MOST RECENT LABS AND IMAGING: CBC    Component Value Date/Time   WBC 8.2 12/02/2019 2113   RBC 5.00 12/02/2019 2113   HGB 13.8 12/02/2019 2113    HGB 12.5 05/16/2018 1206   HCT 40.1 12/02/2019 2113   HCT 36.8 05/16/2018 1206   PLT 291 12/02/2019 2113   PLT 383 05/16/2018 1206   MCV 80.2 12/02/2019 2113   MCV 82 05/16/2018 1206   MCH 27.6 12/02/2019 2113   MCHC 34.4 12/02/2019 2113   RDW 13.5 12/02/2019 2113   RDW 14.1 05/16/2018 1206   LYMPHSABS 3.1 08/13/2019 1611   MONOABS 0.6 08/13/2019 1611   EOSABS 0.1 08/13/2019 1611   BASOSABS 0.1 08/13/2019 1611    CMP     Component Value Date/Time   NA 140 12/31/2019 0854   K 3.7 12/31/2019 0854   CL 103 12/31/2019 0854   CO2 30 12/31/2019 0854   GLUCOSE 114 (H) 12/31/2019 0854   BUN 8 12/31/2019 0854   CREATININE 0.68 12/31/2019 0854   CALCIUM 9.6 12/31/2019 0854   PROT 7.7 08/13/2019 1611   ALBUMIN 4.2 08/13/2019 1611   AST 27 08/13/2019 1611   ALT 20 08/13/2019 1611   ALKPHOS 87 08/13/2019 1611   BILITOT 1.1 08/13/2019 1611   GFRNONAA >60 12/02/2019 2113   GFRAA >60 12/02/2019 2113    Assessment: 1.  History of adenomatous polyps: Last colonoscopy in 2018 with recommendation to repeat in 3 years 2.  Chronic anticoagulation status post MI in June 2019: With Plavix 3.  GERD: Now under control with Pantoprazole 20 and Pepcid 40 daily  Plan: 1.  Scheduled patient for surveillance colonoscopy in the [facility] with Dr. Elmo Putt.  Did discuss risks, benefits, limitations and alternatives and the patient agrees to proceed. 2.  Patient was advised to hold her Plavix for 5 days prior to time procedure.  We will communicate with her prescribing physician to ensure that holding her Plavix is acceptable for her. 3.  Continue Pantoprazole and Famotidine for reflux symptoms. 4.  Patient to follow in clinic with [place]  as needed/directed after colonoscopy  Ellouise Newer, PA-C [facility] 02/07/2020, 8:29 AM  Cc: [name], MD

## 2020-02-07 NOTE — Patient Instructions (Signed)
If you are age 69 or older, your body mass index should be between 23-30. Your Body mass index is 28.11 kg/m. If this is out of the aforementioned range listed, please consider follow up with your Primary Care Provider.  If you are age 46 or younger, your body mass index should be between 19-25. Your Body mass index is 28.11 kg/m. If this is out of the aformentioned range listed, please consider follow up with your Primary Care Provider.   You have been scheduled for a colonoscopy. Please follow written instructions given to you at your visit today.  Please pick up your prep supplies at the pharmacy within the next 1-3 days. If you use inhalers (even only as needed), please bring them with you on the day of your procedure.

## 2020-02-12 ENCOUNTER — Telehealth: Payer: Self-pay | Admitting: Internal Medicine

## 2020-02-12 DIAGNOSIS — I25118 Atherosclerotic heart disease of native coronary artery with other forms of angina pectoris: Secondary | ICD-10-CM

## 2020-02-12 NOTE — Progress Notes (Signed)
  Chronic Care Management   Note  02/12/2020 Name: [name] MRN: [id number] DOB: Sep 21, 1951  [name] is a 69 y.o. year old female who is a primary care patient of [name], MD. I reached out to [name] by phone today in response to a referral sent by Ms. Kerin Ransom Glazer's PCP, [name], MD.   Ms. Rosensteel was given information about Chronic Care Management services today including:  1. CCM service includes personalized support from designated clinical staff supervised by her physician, including individualized plan of care and coordination with other care providers 2. 24/7 contact phone numbers for assistance for urgent and routine care needs. 3. Service will only be billed when office clinical staff spend 20 minutes or more in a month to coordinate care. 4. Only one practitioner may furnish and bill the service in a calendar month. 5. The patient may stop CCM services at any time (effective at the end of the month) by phone call to the office staff.   Patient agreed to services and verbal consent obtained.   Follow up plan:   Raynicia Dukes [employer] Scheduler

## 2020-02-13 NOTE — Progress Notes (Signed)
Addendum: Reviewed and agree with assessment and management plan. Drayk Humbarger M, MD  

## 2020-02-14 ENCOUNTER — Other Ambulatory Visit: Payer: Self-pay | Admitting: Internal Medicine

## 2020-02-14 DIAGNOSIS — Z1159 Encounter for screening for other viral diseases: Secondary | ICD-10-CM | POA: Diagnosis not present

## 2020-02-14 LAB — SARS CORONAVIRUS 2 (TAT 6-24 HRS): SARS Coronavirus 2: NEGATIVE

## 2020-02-20 ENCOUNTER — Telehealth: Payer: Self-pay

## 2020-02-20 ENCOUNTER — Telehealth: Payer: Self-pay | Admitting: *Deleted

## 2020-02-20 NOTE — Telephone Encounter (Signed)
Pt scheduled for procedure in the [facility] Friday with Dr. Hilarie Fredrickson. [facility] just called and states pt was supposed to be told when to hold plavix for procedure. No note in epic from cardiology or managing MD for plavix and pt states she has not received any info about holding plavix. Please advise.

## 2020-02-20 NOTE — Telephone Encounter (Signed)
[facility] Pre-operative Risk Assessment     Request for surgical clearance:     Endoscopy Procedure  What type of surgery is being performed?     colonoscopy  When is this surgery scheduled?     Tuesday 04/08/20 @ 8 am  What type of clearance is required ?   Pharmacy  Are there any medications that need to be held prior to surgery and how long? Plavix 5 days  Practice name and name of physician performing surgery?      [facility]  What is your office phone and fax number?      Phone- [phone]  [phone]  Anesthesia type (None, local, MAC, general) ?       MAC

## 2020-02-20 NOTE — Telephone Encounter (Signed)
Patient has been rescheduled for 04/08/20 @ 8 am. Will work on clearance now.

## 2020-02-21 NOTE — Telephone Encounter (Signed)
   Primary Cardiologist: [name], MD  Chart reviewed as part of pre-operative protocol coverage. Given past medical history and time since last visit, based on ACC/AHA guidelines, TAVI GAUGHRAN would be at acceptable risk for the planned procedure without further cardiovascular testing.   OK to hold plavix for 5 days prior to colonoscopy at low cardiac risk. Resume as soon as OK with GI after the procedure.   I will route this recommendation to the requesting party via Epic fax function and remove from pre-op pool.  Please call with questions.  Daune Perch, NP 02/21/2020, 9:31 AM

## 2020-02-21 NOTE — Telephone Encounter (Signed)
Yes ok to hold plavix x 5 days prior to colonoscopy at low cardiac risk. thx

## 2020-02-22 ENCOUNTER — Encounter: Payer: Self-pay | Admitting: Internal Medicine

## 2020-02-22 NOTE — Addendum Note (Signed)
Addended by: Karle Barr on: 02/22/2020 03:01 PM   Modules accepted: Orders

## 2020-02-27 NOTE — Telephone Encounter (Signed)
Left message for patient to call the office

## 2020-02-27 NOTE — Telephone Encounter (Signed)
Pt returned your call.  

## 2020-02-29 ENCOUNTER — Ambulatory Visit: Payer: Medicare Other | Admitting: Pharmacist

## 2020-02-29 ENCOUNTER — Other Ambulatory Visit: Payer: Self-pay

## 2020-02-29 DIAGNOSIS — E039 Hypothyroidism, unspecified: Secondary | ICD-10-CM

## 2020-02-29 DIAGNOSIS — I25118 Atherosclerotic heart disease of native coronary artery with other forms of angina pectoris: Secondary | ICD-10-CM

## 2020-02-29 DIAGNOSIS — K219 Gastro-esophageal reflux disease without esophagitis: Secondary | ICD-10-CM

## 2020-02-29 DIAGNOSIS — E785 Hyperlipidemia, unspecified: Secondary | ICD-10-CM

## 2020-02-29 DIAGNOSIS — I10 Essential (primary) hypertension: Secondary | ICD-10-CM

## 2020-02-29 NOTE — Patient Instructions (Addendum)
Visit Information  Thank you for meeting with me to discuss your medications! I look forward to working with you to achieve your health care goals. Below is a summary of what we talked about during the visit:  Goals Addressed            This Visit's Progress   . Blood pressure < 130/80       CARE PLAN ENTRY (see longtitudinal plan of care for additional care plan information)  Current Barriers:  . Current antihypertensive regimen: amlodipine 5 mg once daily, carvedilol 6.25 mg twice a day, losartan 25 mg once daily, isosorbide mononitrate ER 30 mg once daily . Previous antihypertensives tried: metoprolol, indapamide, olmesartan, HCTZ, Bystolic, valsartan . Last practice recorded BP readings:  BP Readings from Last 3 Encounters:  02/07/20 124/82  01/29/20 128/70  01/04/20 110/66   . Current home BP readings: 128/78  Pharmacist Clinical Goal(s):  Marland Kitchen Over the next 90 days, patient will work with PharmD and providers to optimize antihypertensive regimen  Interventions: . Comprehensive medication review performed; medication list updated in the electronic medical record.  . Recommended to take carvedilol once in AM and once in PM to ensure 24-hour benefit for blood pressure and heart attack prevention  Patient Self Care Activities:  . Patient will continue to check BP a few times a week , document, and provide at future appointments . Patient will focus on medication adherence by pill box/pill pack  Initial goal documentation     . Cholesterol: LDL < 70       CARE PLAN ENTRY (see longtitudinal plan of care for additional care plan information)  Current Barriers:  . Controlled hyperlipidemia/CAD, complicated by history of CAD . Current antihyperlipidemic regimen: rosuvastatin 10 mg daily, clopidogrel 75 mg daily . Previous antihyperlipidemic medications tried: atorvastatin, pravastatin, Livalo . Most recent lipid panel:     Component Value Date/Time   CHOL 123 08/17/2019  0747   TRIG 103 08/17/2019 0747   HDL 47 08/17/2019 0747   CHOLHDL 2.6 08/17/2019 0747   VLDL 21 08/17/2019 0747   LDLCALC 55 08/17/2019 0747    Pharmacist Clinical Goal(s):  Marland Kitchen Over the next 90 days, patient will work with PharmD and providers towards optimized antihyperlipidemic therapy  Interventions: . Comprehensive medication review performed; medication list updated in electronic medical record.  . Discussed importance of healthy diet and medication adherence . Cholesterol is currently at goal, keep up the good work!  Patient Self Care Activities:  . Patient will focus on medication adherence by pill box/pill pack  Initial goal documentation     . Control GERD/heartburn       CARE PLAN ENTRY  Current Barriers:  . Chronic Disease Management support, education, and care coordination needs related to  GERD  Pharmacist Clinical Goal(s):  Marland Kitchen Improve ease of medication administration  Interventions: . Comprehensive medication review performed. . Recommended to move pantoprazole and famotidine from lunchtime to AM with other meds. They do not have to be separated from other meds, but will work best if they are taken ~30 minutes prior to eating.  Patient Self Care Activities:  . Patient verbalizes understanding of plan to move medications to AM  Initial goal documentation    . Hypothyroidism at goal       CARE PLAN ENTRY  Current Barriers:  . Chronic Disease Management support, education, and care coordination needs related to  hypothyroidism  Pharmacist Clinical Goal(s):  Marland Kitchen Maintain TSH (thyroid lab) within normal range  Interventions: . Comprehensive medication review performed. . Continue taking levothyroxine with other medications in the morning since thyroid level is at goal this way  Patient Self Care Activities:  . Self administers medications as prescribed, Calls pharmacy for medication refills, and Calls provider office for new concerns or  questions  Initial goal documentation    . Pharmacy Care Plan          Current Barriers:  . Polypharmacy; complex patient with multiple comorbidities including hypertension, CAD, hyperlipidemia, hypothyroidism . Self-manages medications by pill box  Pharmacist Clinical Goal(s):  Marland Kitchen Over the next 90 days, patient will work with PharmD and provider towards optimized medication management . Ensure safety, efficacy, and affordability of medications . Improve ease of medication administration  Interventions: . Comprehensive medication review performed; medication list updated in electronic medical record . Utilize [facility] for medication synchronization, packaging and delivery  Patient Self Care Activities:  . Patient will take medications as prescribed . Patient will focus on improved adherence by pill box/pill pack  Initial goal documentation        [name] was given information about Chronic Care Management services today including:  1. CCM service includes personalized support from designated clinical staff supervised by her physician, including individualized plan of care and coordination with other care providers 2. 24/7 contact phone numbers for assistance for urgent and routine care needs. 3. Standard insurance, coinsurance, copays and deductibles apply for chronic care management only during months in which we provide at least 20 minutes of these services. Most insurances cover these services at 100%, however patients may be responsible for any copay, coinsurance and/or deductible if applicable. This service may help you avoid the need for more expensive face-to-face services. 4. Only one practitioner may furnish and bill the service in a calendar month. 5. The patient may stop CCM services at any time (effective at the end of the month) by phone call to the office staff.  Patient agreed to services and verbal consent obtained.   The patient verbalized  understanding of instructions provided today and agreed to receive a mailed copy of patient instruction and/or educational materials. Telephone follow up appointment with pharmacy team member scheduled for: 3 months  Charlene Brooke, PharmD Clinical Pharmacist [facility] [phone]   Food Choices for Gastroesophageal Reflux Disease, Adult When you have gastroesophageal reflux disease (GERD), the foods you eat and your eating habits are very important. Choosing the right foods can help ease your discomfort. Think about working with a nutrition specialist (dietitian) to help you make good choices. What are tips for following this plan?  Meals  Choose healthy foods that are low in fat, such as fruits, vegetables, whole grains, low-fat dairy products, and lean meat, fish, and poultry.  Eat small meals often instead of 3 large meals a day. Eat your meals slowly, and in a place where you are relaxed. Avoid bending over or lying down until 2-3 hours after eating.  Avoid eating meals 2-3 hours before bed.  Avoid drinking a lot of liquid with meals.  Cook foods using methods other than frying. Bake, grill, or broil food instead.  Avoid or limit: ? Chocolate. ? Peppermint or spearmint. ? Alcohol. ? Pepper. ? Black and decaffeinated coffee. ? Black and decaffeinated tea. ? Bubbly (carbonated) soft drinks. ? Caffeinated energy drinks and soft drinks.  Limit high-fat foods such as: ? Fatty meat or fried foods. ? Whole milk, cream, butter, or ice cream. ? Nuts  and nut butters. ? Pastries, donuts, and sweets made with butter or shortening.  Avoid foods that cause symptoms. These foods may be different for everyone. Common foods that cause symptoms include: ? Tomatoes. ? Oranges, lemons, and limes. ? Peppers. ? Spicy food. ? Onions and garlic. ? Vinegar. Lifestyle  Maintain a healthy weight. Ask your doctor what weight is healthy for you. If you need to  lose weight, work with your doctor to do so safely.  Exercise for at least 30 minutes for 5 or more days each week, or as told by your doctor.  Wear loose-fitting clothes.  Do not smoke. If you need help quitting, ask your doctor.  Sleep with the head of your bed higher than your feet. Use a wedge under the mattress or blocks under the bed frame to raise the head of the bed. Summary  When you have gastroesophageal reflux disease (GERD), food and lifestyle choices are very important in easing your symptoms.  Eat small meals often instead of 3 large meals a day. Eat your meals slowly, and in a place where you are relaxed.  Limit high-fat foods such as fatty meat or fried foods.  Avoid bending over or lying down until 2-3 hours after eating.  Avoid peppermint and spearmint, caffeine, alcohol, and chocolate. This information is not intended to replace advice given to you by your health care provider. Make sure you discuss any questions you have with your health care provider. Document Revised: 03/08/2019 Document Reviewed: 12/21/2016 Elsevier Patient Education  [place].

## 2020-02-29 NOTE — Chronic Care Management (AMB) (Signed)
Chronic Care Management Pharmacy  Name: [name]  MRN: [id number] DOB: 03-13-1951  Chief Complaint/ HPI  [name],  69 y.o. , female presents for their Initial CCM visit with the clinical pharmacist via telephone due to COVID-19 Pandemic.  PCP : [name], MD  Their chronic conditions include: HTN, CAD, HLD, GERD, hypothyroidism,  Prediabetes  Grew up in [place], [place], been in [place] for 30 years. 2 daughters, 6 grandkids, 6 grandkids, from [place] to [place]. Retired in 80. Husband died 01/30/2017.   Office Visits: 01-07-20 Dr [name] OV: pt stable, recent ED for chest pain, determined related to GERD. Changed PPI to Dexilant, however pt could not afford so back to pantoprazole.  Consult Visit: 02/07/20 PA Lemmon (GI): scheduled colonoscopy. Pt to hold Plavix for 5 days prior.   01/29/20 Dr [name] (Sports med): zinc 30 mg daily x 1 month.   01/04/20 NP Phylliss Bob (cardiology): meds reviewed in depth w/ pt, no changes.  12/02/19 ED visit: chest pain, determined related to GERD. Improved with GI cocktail, somewhat worsened by NTG.  09/06/19 NP Phylliss Bob (cardiology): went over med changes from hospitalization on 9/18 - switched metoprolol to carvedilol, stopped indapamide and started amlodipine  Medications: Outpatient Encounter Medications as of 02/29/2020  Medication Sig  . amLODipine (NORVASC) 5 MG tablet Take 1 tablet (5 mg total) by mouth daily.  . carvedilol (COREG) 6.25 MG tablet Take 1 tablet (6.25 mg total) by mouth 2 (two) times daily with a meal.  . clopidogrel (PLAVIX) 75 MG tablet Take 1 tablet (75 mg total) by mouth daily with breakfast.  . famotidine (PEPCID) 40 MG tablet Take 1 tablet (40 mg total) by mouth daily.  . fluticasone (FLONASE) 50 MCG/ACT nasal spray Place 1 spray into both nostrils daily.  . isosorbide mononitrate (IMDUR) 30 MG 24 hr tablet Take 1 tablet (30 mg total) by mouth daily.  Marland Kitchen levothyroxine (SYNTHROID) 88 MCG tablet TAKE 1 TABLET BY MOUTH EVERY DAY  .  linaclotide (LINZESS) 145 MCG CAPS capsule Take 1 capsule (145 mcg total) by mouth daily before breakfast. (Patient taking differently: Take 145 mcg by mouth daily as needed. )  . losartan (COZAAR) 25 MG tablet Take 1 tablet (25 mg total) by mouth daily.  . meclizine (ANTIVERT) 25 MG tablet Take 1 tablet (25 mg total) by mouth 2 (two) times daily as needed for dizziness.  . nitroGLYCERIN (NITROSTAT) 0.4 MG SL tablet Place 1 tablet (0.4 mg total) under the tongue every 5 (five) minutes as needed for chest pain.  . pantoprazole (PROTONIX) 20 MG tablet Take 1 tablet (20 mg total) by mouth daily.  . rosuvastatin (CRESTOR) 10 MG tablet TAKE 1 TABLET BY MOUTH EVERY DAY  . Vitamin D, Ergocalciferol, (DRISDOL) 1.25 MG (50000 UNIT) CAPS capsule TAKE 1 CAPSULE (50,000 UNITS TOTAL) BY MOUTH EVERY 7 (SEVEN) DAYS.   No facility-administered encounter medications on file as of 02/29/2020.     Current Diagnosis/Assessment:  SDOH Interventions     Most Recent Value  SDOH Interventions  SDOH Interventions for the Following Domains  Stress  Stress Interventions  Provide Counseling      Goals Addressed            This Visit's Progress   . Blood pressure < 130/80       CARE PLAN ENTRY (see longtitudinal plan of care for additional care plan information)  Current Barriers:  . Current antihypertensive regimen: amlodipine 5 mg once daily, carvedilol 6.25 mg twice  a day, losartan 25 mg once daily, isosorbide mononitrate ER 30 mg once daily . Previous antihypertensives tried: metoprolol, indapamide, olmesartan, HCTZ, Bystolic, valsartan . Last practice recorded BP readings:  BP Readings from Last 3 Encounters:  02/07/20 124/82  01/29/20 128/70  01/04/20 110/66   . Current home BP readings: 128/78  Pharmacist Clinical Goal(s):  Marland Kitchen Over the next 90 days, patient will work with PharmD and providers to optimize antihypertensive regimen  Interventions: . Comprehensive medication review performed;  medication list updated in the electronic medical record.  . Recommended to take carvedilol once in AM and once in PM to ensure 24-hour benefit for blood pressure and heart attack prevention  Patient Self Care Activities:  . Patient will continue to check BP a few times a week , document, and provide at future appointments . Patient will focus on medication adherence by pill box/pill pack  Initial goal documentation     . Cholesterol: LDL < 70       CARE PLAN ENTRY (see longtitudinal plan of care for additional care plan information)  Current Barriers:  . Controlled hyperlipidemia/CAD, complicated by history of CAD . Current antihyperlipidemic regimen: rosuvastatin 10 mg daily, clopidogrel 75 mg daily . Previous antihyperlipidemic medications tried: atorvastatin, pravastatin, Livalo . Most recent lipid panel:     Component Value Date/Time   CHOL 123 08/17/2019 0747   TRIG 103 08/17/2019 0747   HDL 47 08/17/2019 0747   CHOLHDL 2.6 08/17/2019 0747   VLDL 21 08/17/2019 0747   LDLCALC 55 08/17/2019 0747    Pharmacist Clinical Goal(s):  Marland Kitchen Over the next 90 days, patient will work with PharmD and providers towards optimized antihyperlipidemic therapy  Interventions: . Comprehensive medication review performed; medication list updated in electronic medical record.  . Discussed importance of healthy diet and medication adherence . Cholesterol is currently at goal, keep up the good work!  Patient Self Care Activities:  . Patient will focus on medication adherence by pill box/pill pack  Initial goal documentation     . Control GERD/heartburn       CARE PLAN ENTRY  Current Barriers:  . Chronic Disease Management support, education, and care coordination needs related to  GERD  Pharmacist Clinical Goal(s):  Marland Kitchen Improve ease of medication administration  Interventions: . Comprehensive medication review performed. . Recommended to move pantoprazole and famotidine from lunchtime to  AM with other meds. They do not have to be separated from other meds, but will work best if they are taken ~30 minutes prior to eating.  Patient Self Care Activities:  . Patient verbalizes understanding of plan to move medications to AM  Initial goal documentation    . Hypothyroidism at goal       CARE PLAN ENTRY  Current Barriers:  . Chronic Disease Management support, education, and care coordination needs related to  hypothyroidism  Pharmacist Clinical Goal(s):  Marland Kitchen Maintain TSH (thyroid lab) within normal range  Interventions: . Comprehensive medication review performed. . Continue taking levothyroxine with other medications in the morning since thyroid level is at goal this way  Patient Self Care Activities:  . Self administers medications as prescribed, Calls pharmacy for medication refills, and Calls provider office for new concerns or questions  Initial goal documentation    . Pharmacy Care Plan          Current Barriers:  . Polypharmacy; complex patient with multiple comorbidities including hypertension, CAD, hyperlipidemia, hypothyroidism . Self-manages medications by pill box  Pharmacist Clinical Goal(s):  .  Over the next 90 days, patient will work with PharmD and provider towards optimized medication management . Ensure safety, efficacy, and affordability of medications . Improve ease of medication administration  Interventions: . Comprehensive medication review performed; medication list updated in electronic medical record . Utilize [facility] for medication synchronization, packaging and delivery  Patient Self Care Activities:  . Patient will take medications as prescribed . Patient will focus on improved adherence by pill box/pill pack  Initial goal documentation        Hypertension   BP today is:  <130/80  Office blood pressures are  BP Readings from Last 3 Encounters:  02/07/20 124/82  01/29/20 128/70  01/04/20 110/66   Lab Results    Component Value Date   CREATININE 0.68 12/31/2019   CREATININE 0.74 12/02/2019   CREATININE 0.77 08/17/2019   Patient has failed these meds in the past: metoprolol, indapamide, olmesartan, HCTZ, Bystolic, valsartan  Patient is currently controlled on the following medications: amlodipine 5 mg daily AM, carvedilol 6.25 mg BID, losartan 25 mg daily, isosorbide [place] 30 mg daily  Patient checks BP at home 3-5x per week  Patient home BP readings are ranging: 128/78, HR 83  We discussed diet and exercise extensively, pt avoids salt, greasy foods. Pt has only been taking carvedilol once a day, discussed duration of action for carvedilol and benefits of BID dosing, including cardioprotection. Pt agreed to take BID.  Plan  Continue current medications and control with diet and exercise  Counseled to take carvedilol BID  Hyperlipidemia/CAD   Cath 05/08/2018: ACS c/w stress cardiomyopathy, largely patent vessels, no PCI, rec'd risk factor modification.    Lipid Panel     Component Value Date/Time   CHOL 123 08/17/2019 0747   TRIG 103 08/17/2019 0747   HDL 47 08/17/2019 0747   CHOLHDL 2.6 08/17/2019 0747   VLDL 21 08/17/2019 0747   LDLCALC 55 08/17/2019 0747     Patient has failed these meds in past: atorvastatin, pravastatin, pitavastatin  Patient is currently controlled on the following medications: rosuvastatin 10 mg daily, clopidogrel 75 mg daily, nitroglycerin 0.4 mg PRN   We discussed:  diet and exercise extensively, cholesterol goals, benefits of statin and clopidogrel for secondary prevention.  Plan  Continue current medications and control with diet and exercise    Hypothyroidism   TSH  Date Value Ref Range Status  12/31/2019 1.06 0.35 - 4.50 uIU/mL Final    Patient has failed these meds in past:  Patient is currently controlled on the following medications: levothyroxine 88 mcg daily  We discussed:  Pt is taking levothyroxine with other meds in the morning,  discussed since TSH is at goal she should continue taking it this way.  Plan  Continue current medications    GERD/GI   Patient has failed these meds in past: Dexilant (cost) Patient is currently controlled on the following medications: pantoprazole 20 mg daily, famotidine 40 mg daily, Linzess 145 mcg PRN  We discussed:  Pt takes PPI and Pepcid at 12pm, she thought they had to be separated from other meds. To simplify med administration, counseled pt to move meds to breakfast with other meds if desired. Plan  Continue current medications   Health Maintenance   Patient is currently controlled on the following medications: Flonase prn, Vitamin D 50K IU weekly Sat,  meclizine 25 mg BID prn  We discussed:  Patient is satisfied with current OTC regimen and denies issues  Plan  Continue current medications  Medication Management   Pt uses [facility] for all medications Uses pill box Pt endorses 90% compliance - wasn't taking carvedilol in PM  We discussed: Verbal consent obtained for [facility] enhanced pharmacy services (medication synchronization, adherence packaging, delivery coordination). A medication sync plan was created to allow patient to get all medications delivered once every 30 to 90 days per patient preference. Patient understands they have freedom to choose pharmacy and clinical pharmacist will coordinate care between all prescribers and [facility].  Plan  Utilize [facility] for medication synchronization, packaging and delivery     Follow up: 3 month phone visit  Charlene Brooke, PharmD Clinical Pharmacist [facility] at [facility] [phone]

## 2020-03-02 NOTE — Progress Notes (Signed)
I have collaborated with the care management provider regarding care management and care coordination activities outlined in this encounter and have reviewed this encounter including documentation in the note and care plan. I am certifying that I agree with the content of this note and encounter as supervising physician.  

## 2020-03-03 ENCOUNTER — Telehealth: Payer: Self-pay

## 2020-03-03 DIAGNOSIS — E039 Hypothyroidism, unspecified: Secondary | ICD-10-CM

## 2020-03-03 DIAGNOSIS — K219 Gastro-esophageal reflux disease without esophagitis: Secondary | ICD-10-CM

## 2020-03-03 DIAGNOSIS — K2100 Gastro-esophageal reflux disease with esophagitis, without bleeding: Secondary | ICD-10-CM

## 2020-03-03 MED ORDER — PANTOPRAZOLE SODIUM 20 MG PO TBEC: 20.0000 mg | DELAYED_RELEASE_TABLET | Freq: Every day | ORAL | 1 refills | Status: DC

## 2020-03-03 MED ORDER — LEVOTHYROXINE SODIUM 88 MCG PO TABS: 88.0000 ug | ORAL_TABLET | Freq: Every day | ORAL | 1 refills | Status: DC

## 2020-03-03 MED ORDER — VITAMIN D (ERGOCALCIFEROL) 1.25 MG (50000 UNIT) PO CAPS: 50000.0000 [IU] | ORAL_CAPSULE | ORAL | 0 refills | Status: DC

## 2020-03-03 MED ORDER — FAMOTIDINE 40 MG PO TABS: 40.0000 mg | ORAL_TABLET | Freq: Every day | ORAL | 1 refills | Status: DC

## 2020-03-03 NOTE — Telephone Encounter (Signed)
-----   Message from [place], [facility] sent at 02/29/2020  1:00 PM EDT ----- Regarding: Med refills Pt is switching to [facility], can you order her med refills?  Vitamin D 50,000 IU weekly Famotidine 40 mg Levothyroxine 88 mcg Pantoprazole 20 mg

## 2020-03-04 ENCOUNTER — Other Ambulatory Visit: Payer: Self-pay | Admitting: Internal Medicine

## 2020-03-04 ENCOUNTER — Telehealth: Payer: Self-pay | Admitting: Cardiovascular Disease

## 2020-03-04 MED ORDER — CLOPIDOGREL BISULFATE 75 MG PO TABS: 75.0000 mg | ORAL_TABLET | Freq: Every day | ORAL | 3 refills | Status: DC

## 2020-03-04 MED ORDER — LOSARTAN POTASSIUM 25 MG PO TABS: 25.0000 mg | ORAL_TABLET | Freq: Every day | ORAL | 3 refills | Status: DC

## 2020-03-04 MED ORDER — ISOSORBIDE MONONITRATE ER 30 MG PO TB24: 30.0000 mg | ORAL_TABLET | Freq: Every day | ORAL | 3 refills | Status: DC

## 2020-03-04 MED ORDER — AMLODIPINE BESYLATE 5 MG PO TABS: 5.0000 mg | ORAL_TABLET | Freq: Every day | ORAL | 3 refills | Status: DC

## 2020-03-04 MED ORDER — CARVEDILOL 6.25 MG PO TABS: 6.2500 mg | ORAL_TABLET | Freq: Two times a day (BID) | ORAL | 3 refills | Status: DC

## 2020-03-04 MED ORDER — ROSUVASTATIN CALCIUM 10 MG PO TABS: 10.0000 mg | ORAL_TABLET | Freq: Every day | ORAL | 3 refills | Status: DC

## 2020-03-04 MED ORDER — NITROGLYCERIN 0.4 MG SL SUBL: 0.4000 mg | SUBLINGUAL_TABLET | SUBLINGUAL | 6 refills | Status: AC | PRN

## 2020-03-04 NOTE — Telephone Encounter (Signed)
Pt's medications were sent to pt's pharmacy as requested. Confirmation received.  

## 2020-03-04 NOTE — Telephone Encounter (Signed)
*  STAT* If patient is at the pharmacy, call can be transferred to refill team.   1. Which medications need to be refilled? (please list name of each medication and dose if known)  Carvedilol, Clopidogrel, Isosorbide, Lorartan, Rosuvastati, Amlodipine and Nirtoglycerin  2. Which pharmacy/location (including street and city if local pharmacy) is medication to be sent to? [facility]- [phone]  3. Do they need a 30 day or 90 day supply? 90 days and refills

## 2020-03-04 NOTE — Telephone Encounter (Signed)
Patient informed to hold Plavix for 5 days prior to colonoscopy.

## 2020-03-14 ENCOUNTER — Ambulatory Visit: Payer: Medicare Other | Admitting: Family Medicine

## 2020-03-23 ENCOUNTER — Other Ambulatory Visit: Payer: Self-pay | Admitting: Internal Medicine

## 2020-03-23 DIAGNOSIS — E039 Hypothyroidism, unspecified: Secondary | ICD-10-CM

## 2020-03-31 ENCOUNTER — Telehealth: Payer: Self-pay | Admitting: Physician Assistant

## 2020-03-31 NOTE — Telephone Encounter (Signed)
Spoke with the patient and reviewed the time and date of the patient's procedure.

## 2020-03-31 NOTE — Telephone Encounter (Signed)
Pt is scheduled for a colonoscopy 04/08/20 and requested to go over instructions.

## 2020-04-03 DIAGNOSIS — H40029 Open angle with borderline findings, high risk, unspecified eye: Secondary | ICD-10-CM | POA: Diagnosis not present

## 2020-04-04 ENCOUNTER — Telehealth: Payer: Self-pay

## 2020-04-04 NOTE — Telephone Encounter (Signed)
Pt has not had a recent COVID test for upcoming colonoscopy procedure schedule for 04/08/20.  Called and spoke to pt.  Pt stated that she had her 2nd dose of the COVID vaccination on 03/31/20 which is less than 2 weeks before procedure.  Pt stated that she did not want to get a COVID test. Pt said that she was positive for COVID back in January but it has been over 90 days. Informed pt of policy that requires pt to have 2nd dose of COVID vaccination completed at least 2 weeks before procedure or have a COVID test.  Pt does not want to get another COVID test.  Informed pt that I will send a message to Dr. Hilarie Fredrickson for clearance to perform procedure. Instructed pt to follow through with prep instructions as planned. Pt stated that she has stopped taking Plavix on 04/02/20 as instructed.

## 2020-04-07 NOTE — Telephone Encounter (Signed)
Spoke with pt and she is aware. She rescheduled to 05/28/20@8am  for colon. New instructions mailed to pt and she will not need covid test now. Pt knows to resume her plavix now and will have undated instructions as to when to hold it again.

## 2020-04-07 NOTE — Telephone Encounter (Signed)
By policy the patient will need a COVID-19 test if within 2 weeks of the 2nd vaccine. This policy is in place to keep patients and staff as safe as possible during the pandemic. She is very close to reaching her 14 days after vaccination. I can apologize for the inconvenience but the policy is our policy  She can try to get a test today or reschedule to another date for colonoscopy which will be 14 days after 2nd vaccine at which point she would not need preprocedure testing  If she delays the procedure, she should resume Plavix today and then stop 5 days before the new procedure date.

## 2020-04-08 ENCOUNTER — Encounter: Payer: Self-pay | Admitting: Internal Medicine

## 2020-04-10 ENCOUNTER — Ambulatory Visit (INDEPENDENT_AMBULATORY_CARE_PROVIDER_SITE_OTHER): Payer: Medicare Other | Admitting: Internal Medicine

## 2020-04-10 ENCOUNTER — Other Ambulatory Visit: Payer: Self-pay

## 2020-04-10 ENCOUNTER — Encounter: Payer: Self-pay | Admitting: Internal Medicine

## 2020-04-10 VITALS — BP 160/86 | HR 70 | Temp 98.5°F | Ht 67.0 in | Wt 180.0 lb

## 2020-04-10 DIAGNOSIS — H6692 Otitis media, unspecified, left ear: Secondary | ICD-10-CM

## 2020-04-10 DIAGNOSIS — J309 Allergic rhinitis, unspecified: Secondary | ICD-10-CM

## 2020-04-10 DIAGNOSIS — H6982 Other specified disorders of Eustachian tube, left ear: Secondary | ICD-10-CM

## 2020-04-10 MED ORDER — MUCINEX 600 MG PO TB12: 1200.0000 mg | ORAL_TABLET | Freq: Two times a day (BID) | ORAL | 1 refills | Status: DC | PRN

## 2020-04-10 MED ORDER — PREDNISONE 10 MG PO TABS: ORAL_TABLET | ORAL | 0 refills | Status: DC

## 2020-04-10 MED ORDER — AZITHROMYCIN 250 MG PO TABS: ORAL_TABLET | ORAL | 0 refills | Status: DC

## 2020-04-10 NOTE — Progress Notes (Signed)
Subjective:    Patient ID: [name], female    DOB: 1951/05/27, 69 y.o.   MRN: [id number]  HPI   Here with 2-3 days acute onset fever, left ear pain, pressure, headache, general weakness and malaise, and greenish d/c, with mild ST and cough, but pt denies chest pain, wheezing, increased sob or doe, orthopnea, PND, increased LE swelling, palpitations, dizziness or syncope. Does have several wks ongoing nasal allergy symptoms with clearish congestion, itch and sneezing, without fever, pain, ST, cough, swelling or wheezing.  Also left ear popping and crackling as well. Past Medical History:  Diagnosis Date  . Anemia   . Arthritis   . Cholelithiasis   . Chronic back pain   . Constipation   . Esophageal reflux   . Fibromyalgia   . Hyperlipidemia   . Hypertension   . Hypothyroid   . IBS (irritable bowel syndrome)   . Internal hemorrhoids without mention of complication   . Left leg pain   . Personal history of colonic polyps 05/06/2010   ADENOMATOUS POLYP   Past Surgical History:  Procedure Laterality Date  . ABDOMINAL HYSTERECTOMY    . CERVICAL DISC SURGERY     PLATE IN NECK & BACK  . CHOLECYSTECTOMY    . COLONOSCOPY  09/27/2011   NORMAL   . LEFT HEART CATH AND CORONARY ANGIOGRAPHY N/A 05/08/2018   Procedure: LEFT HEART CATH AND CORONARY ANGIOGRAPHY;  Surgeon: Belva Crome, MD;  Location: [facility];  Service: Cardiovascular;  Laterality: N/A;  . LUMBAR [place]    . TONSILLECTOMY      reports that she has never smoked. She has never used smokeless tobacco. She reports that she does not drink alcohol or use drugs. family history includes Breast cancer in her cousin and maternal aunt; COPD in her mother; Dementia in her mother; Diabetes in her mother; Heart failure in her father; Hypertension in her mother; Kidney disease in her mother; Lung disease in her mother. Allergies  Allergen Reactions  . Aspirin Other (See Comments)    unknown  . Gadolinium Hives   patient unsure of which kind of dye she had a reaction to until reaction to Magnevist - may be allergic to x-ray contrast, Onset Date: [id number]   . Iohexol Hives    patient unsure of which kind of dye she had a reaction to until reaction to Magnevist - may be allergic to x-ray contrast, Onset Date: [id number]  . Livalo [Pitavastatin] Other (See Comments)    myalgias  . Hydrocodone-Acetaminophen Other (See Comments)  . Lipitor [Atorvastatin] Other (See Comments)    Myalgias   Current Outpatient Medications on File Prior to Visit  Medication Sig Dispense Refill  . amLODipine (NORVASC) 5 MG tablet Take 1 tablet (5 mg total) by mouth daily. 90 tablet 3  . carvedilol (COREG) 6.25 MG tablet Take 1 tablet (6.25 mg total) by mouth 2 (two) times daily with a meal. 180 tablet 3  . clopidogrel (PLAVIX) 75 MG tablet Take 1 tablet (75 mg total) by mouth daily with breakfast. 90 tablet 3  . famotidine (PEPCID) 40 MG tablet Take 1 tablet (40 mg total) by mouth daily. 90 tablet 1  . fluticasone (FLONASE) 50 MCG/ACT nasal spray Place 1 spray into both nostrils daily. 16 g 2  . isosorbide mononitrate (IMDUR) 30 MG 24 hr tablet Take 1 tablet (30 mg total) by mouth daily. 90 tablet 3  . levothyroxine (SYNTHROID) 88 MCG tablet TAKE 1 TABLET  BY MOUTH EVERY DAY 90 tablet 1  . linaclotide (LINZESS) 145 MCG CAPS capsule Take 1 capsule (145 mcg total) by mouth daily before breakfast. (Patient taking differently: Take 145 mcg by mouth daily as needed. ) 90 capsule 1  . losartan (COZAAR) 25 MG tablet Take 1 tablet (25 mg total) by mouth daily. 90 tablet 3  . meclizine (ANTIVERT) 25 MG tablet Take 1 tablet (25 mg total) by mouth 2 (two) times daily as needed for dizziness. 10 tablet 0  . nitroGLYCERIN (NITROSTAT) 0.4 MG SL tablet Place 1 tablet (0.4 mg total) under the tongue every 5 (five) minutes as needed for chest pain. 25 tablet 6  . pantoprazole (PROTONIX) 20 MG tablet Take 1 tablet (20 mg total) by mouth daily. 90  tablet 1  . rosuvastatin (CRESTOR) 10 MG tablet Take 1 tablet (10 mg total) by mouth daily. 90 tablet 3  . Vitamin D, Ergocalciferol, (DRISDOL) 1.25 MG (50000 UNIT) CAPS capsule TAKE 1 CAPSULE (50,000 UNITS TOTAL) BY MOUTH EVERY 7 (SEVEN) DAYS. 12 capsule 0  . [DISCONTINUED] levothyroxine (SYNTHROID) 88 MCG tablet Take 1 tablet (88 mcg total) by mouth daily. 90 tablet 1   No current facility-administered medications on file prior to visit.   Review of Systems All otherwise neg per pt     Objective:   Physical Exam BP (!) 160/86 (BP Location: Left Arm, Patient Position: Sitting, Cuff Size: Large)   Pulse 70   Temp 98.5 F (36.9 C) (Oral)   Ht 5' 7"  (1.702 m)   Wt 180 lb (81.6 kg)   SpO2 96%   BMI 28.19 kg/m  VS noted,  Constitutional: Pt appears in NAD HENT: Head: NCAT.  Right Ear: External ear normal.  Left Ear: External ear normal.  Eyes: . Pupils are equal, round, and reactive to light. Conjunctivae and EOM are normal Bilat tm's with mild erythema left > right.  Max sinus areas none tender.  Pharynx with mild erythema, no exudate Nose: without d/c or deformity Neck: Neck supple. Gross normal ROM Cardiovascular: Normal rate and regular rhythm.   Pulmonary/Chest: Effort normal and breath sounds without rales or wheezing.  Abd:  Soft, NT, ND, + BS, no organomegaly Neurological: Pt is alert. At baseline orientation, motor grossly intact Skin: Skin is warm. No rashes, other new lesions, no LE edema Psychiatric: Pt behavior is normal without agitation  All otherwise neg per pt Lab Results  Component Value Date   WBC 8.2 12/02/2019   HGB 13.8 12/02/2019   HCT 40.1 12/02/2019   PLT 291 12/02/2019   GLUCOSE 114 (H) 12/31/2019   CHOL 123 08/17/2019   TRIG 103 08/17/2019   HDL 47 08/17/2019   LDLDIRECT 88.0 10/19/2016   LDLCALC 55 08/17/2019   ALT 20 08/13/2019   AST 27 08/13/2019   NA 140 12/31/2019   K 3.7 12/31/2019   CL 103 12/31/2019   CREATININE 0.68 12/31/2019    BUN 8 12/31/2019   CO2 30 12/31/2019   TSH 1.06 12/31/2019   INR 1.01 05/08/2018   HGBA1C 6.2 12/31/2019      Assessment & Plan:

## 2020-04-10 NOTE — Patient Instructions (Signed)
Please take all new medication as prescribed - the antibiotic and prednisone  Please also take the mucinex twice per day to help with the left clogged ear and fluid  Please continue all other medications as before, including continuing the flonase  Please have the pharmacy call with any other refills you may need.  Please continue your efforts at being more active, low cholesterol diet, and weight control.  Please keep your appointments with your specialists as you may have planned

## 2020-04-10 NOTE — Telephone Encounter (Signed)
Updated instructions mailed to pt.

## 2020-04-12 ENCOUNTER — Encounter: Payer: Self-pay | Admitting: Internal Medicine

## 2020-04-12 DIAGNOSIS — H6982 Other specified disorders of Eustachian tube, left ear: Secondary | ICD-10-CM | POA: Insufficient documentation

## 2020-04-12 DIAGNOSIS — J309 Allergic rhinitis, unspecified: Secondary | ICD-10-CM | POA: Insufficient documentation

## 2020-04-12 DIAGNOSIS — H6692 Otitis media, unspecified, left ear: Secondary | ICD-10-CM | POA: Insufficient documentation

## 2020-04-12 NOTE — Assessment & Plan Note (Signed)
Mild to mod, for predpac asd, restart fonase, to f/u any worsening symptoms or concerns

## 2020-04-12 NOTE — Assessment & Plan Note (Signed)
For mucinex otc bid prn

## 2020-04-12 NOTE — Assessment & Plan Note (Addendum)
Mild to mod, for antibx course,  to f/u any worsening symptoms or concerns  I spent 31 minutes in preparing to see the patient by review of recent labs, imaging and procedures, obtaining and reviewing separately obtained history, communicating with the patient and family or caregiver, ordering medications, tests or procedures, and documenting clinical information in the EHR including the differential Dx, treatment, and any further evaluation and other management of left otitis media, left eustahcian tube dysfxn, allergies

## 2020-04-25 ENCOUNTER — Other Ambulatory Visit: Payer: Self-pay

## 2020-04-25 ENCOUNTER — Encounter: Payer: Self-pay | Admitting: Cardiovascular Disease

## 2020-04-25 ENCOUNTER — Ambulatory Visit (INDEPENDENT_AMBULATORY_CARE_PROVIDER_SITE_OTHER): Payer: Medicare Other | Admitting: Cardiovascular Disease

## 2020-04-25 VITALS — BP 126/64 | HR 83 | Ht 67.0 in | Wt 182.0 lb

## 2020-04-25 DIAGNOSIS — I2510 Atherosclerotic heart disease of native coronary artery without angina pectoris: Secondary | ICD-10-CM

## 2020-04-25 DIAGNOSIS — I5181 Takotsubo syndrome: Secondary | ICD-10-CM

## 2020-04-25 DIAGNOSIS — I10 Essential (primary) hypertension: Secondary | ICD-10-CM | POA: Diagnosis not present

## 2020-04-25 DIAGNOSIS — E782 Mixed hyperlipidemia: Secondary | ICD-10-CM

## 2020-04-25 MED ORDER — FLUTICASONE PROPIONATE 50 MCG/ACT NA SUSP: 1.0000 | Freq: Every day | NASAL | 2 refills | Status: DC

## 2020-04-25 NOTE — Progress Notes (Signed)
Cardiology Office Note:    Date:  04/25/2020   ID:  [name], DOB Nov 10, 1951, MRN [id number]  PCP:  [name], MD  Cardiologist:  [name], MD  Electrophysiologist:  None   Referring MD: [name], MD   Chief Complaint  Patient presents with  . Coronary Artery Disease    History of Present Illness:    [name] is a 69 y.o. female with a hx of Takotsubo cardiomyopathy, presenting today for follow-up evaluation.  The patient presented in 2019 with acute Takotsubo syndrome, LVEF 45% at the time.  Follow-up echo demonstrated normalization of LV function with an LVEF in the range of 70 to 75%.  The patient is also been followed for hypertension and hypertensive heart disease.  She had COVID-19 in January 2021 but had a good recovery from this.  The patient is here alone today.  She is doing well overall.  She has been walking for exercise on a regular basis with no exertional symptoms.  Her blood pressure has been well controlled.  She denies chest pain, chest pressure, or shortness of breath.  No heart palpitations, orthopnea, or PND.  Past Medical History:  Diagnosis Date  . Anemia   . Arthritis   . Cholelithiasis   . Chronic back pain   . Constipation   . Esophageal reflux   . Fibromyalgia   . Hyperlipidemia   . Hypertension   . Hypothyroid   . IBS (irritable bowel syndrome)   . Internal hemorrhoids without mention of complication   . Left leg pain   . Personal history of colonic polyps 05/06/2010   ADENOMATOUS POLYP    Past Surgical History:  Procedure Laterality Date  . ABDOMINAL HYSTERECTOMY    . CERVICAL DISC SURGERY     PLATE IN NECK & BACK  . CHOLECYSTECTOMY    . COLONOSCOPY  09/27/2011   NORMAL   . LEFT HEART CATH AND CORONARY ANGIOGRAPHY N/A 05/08/2018   Procedure: LEFT HEART CATH AND CORONARY ANGIOGRAPHY;  Surgeon: Belva Crome, MD;  Location: [facility];  Service: Cardiovascular;  Laterality: N/A;  . LUMBAR [place]      . TONSILLECTOMY      Current Medications: Current Meds  Medication Sig  . amLODipine (NORVASC) 5 MG tablet Take 1 tablet (5 mg total) by mouth daily.  . carvedilol (COREG) 6.25 MG tablet Take 1 tablet (6.25 mg total) by mouth 2 (two) times daily with a meal.  . clopidogrel (PLAVIX) 75 MG tablet Take 1 tablet (75 mg total) by mouth daily with breakfast.  . famotidine (PEPCID) 40 MG tablet Take 1 tablet (40 mg total) by mouth daily.  . fluticasone (FLONASE) 50 MCG/ACT nasal spray Place 1 spray into both nostrils daily.  Marland Kitchen guaiFENesin (MUCINEX) 600 MG 12 hr tablet Take 2 tablets (1,200 mg total) by mouth 2 (two) times daily as needed.  . isosorbide mononitrate (IMDUR) 30 MG 24 hr tablet Take 1 tablet (30 mg total) by mouth daily.  Marland Kitchen levothyroxine (SYNTHROID) 88 MCG tablet TAKE 1 TABLET BY MOUTH EVERY DAY  . linaclotide (LINZESS) 145 MCG CAPS capsule Take 1 capsule (145 mcg total) by mouth daily before breakfast. (Patient taking differently: Take 145 mcg by mouth daily as needed. )  . losartan (COZAAR) 25 MG tablet Take 1 tablet (25 mg total) by mouth daily.  . meclizine (ANTIVERT) 25 MG tablet Take 1 tablet (25 mg total) by mouth 2 (two) times daily as  needed for dizziness.  . nitroGLYCERIN (NITROSTAT) 0.4 MG SL tablet Place 1 tablet (0.4 mg total) under the tongue every 5 (five) minutes as needed for chest pain.  . pantoprazole (PROTONIX) 20 MG tablet Take 1 tablet (20 mg total) by mouth daily.  . rosuvastatin (CRESTOR) 10 MG tablet Take 1 tablet (10 mg total) by mouth daily.  . Vitamin D, Ergocalciferol, (DRISDOL) 1.25 MG (50000 UNIT) CAPS capsule TAKE 1 CAPSULE (50,000 UNITS TOTAL) BY MOUTH EVERY 7 (SEVEN) DAYS.  . [DISCONTINUED] fluticasone (FLONASE) 50 MCG/ACT nasal spray Place 1 spray into both nostrils daily.     Allergies:   Aspirin, Gadolinium, Iohexol, Livalo [pitavastatin], Hydrocodone-acetaminophen, and Lipitor [atorvastatin]   Social History   Socioeconomic History  . Marital  status: Widowed    Spouse name: Not on file  . Number of children: 2  . Years of education: Not on file  . Highest education level: Not on file  Occupational History  . Occupation: housewife    Employer: UNEMPLOYED   Tobacco Use  . Smoking status: Never Smoker  . Smokeless tobacco: Never Used  Substance and Sexual Activity  . Alcohol use: No    Alcohol/week: 0.0 standard drinks  . Drug use: No  . Sexual activity: Not Currently  Other Topics Concern  . Not on file  Social History Narrative   Daily Caffeine Use   Social Determinants of Health   Financial Resource Strain:   . Difficulty of Paying Living Expenses:   Food Insecurity:   . Worried About Charity fundraiser in the Last Year:   . Arboriculturist in the Last Year:   Transportation Needs:   . Film/video editor (Medical):   Marland Kitchen Lack of Transportation (Non-Medical):   Physical Activity: Inactive  . Days of Exercise per Week: 0 days  . Minutes of Exercise per Session: 0 min  Stress: No Stress Concern Present  . Feeling of Stress : Only a little  Social Connections:   . Frequency of Communication with Friends and Family:   . Frequency of Social Gatherings with Friends and Family:   . Attends Religious Services:   . Active Member of Clubs or Organizations:   . Attends Archivist Meetings:   Marland Kitchen Marital Status:      Family History: The patient's family history includes Breast cancer in her cousin and maternal aunt; COPD in her mother; Dementia in her mother; Diabetes in her mother; Heart failure in her father; Hypertension in her mother; Kidney disease in her mother; Lung disease in her mother. There is no history of Colon cancer.  ROS:   Please see the history of present illness.    All other systems reviewed and are negative.  EKGs/Labs/Other Studies Reviewed:    The following studies were reviewed today: Cardiac Catheterization 05-08-2018: Conclusion   Acute coronary syndrome with anterior wall  motion abnormality and essentially widely patent coronary arteries.  Suspect variant stress cardiomyopathy.  (Cannot totally exclude transient occlusion and recanalization of the first diagonal which is relatively small and has moderate somewhat hazy disease in the mid segment up to 70%.)  Normal left main  LAD is large, wraps around the left ventricular apex, and gives origin to 3 diagonal branches, the second of which is discussed above with mid vessel 70% narrowing in an artery that is less than 2 mm in diameter.  Beyond that there is either a small branch or occlusion of the branch.  Images are ambiguous.  Circumflex is large, first marginal and second marginal widely patent.  Second marginal bifurcates.  Right coronary is tortuous but widely patent.  Abnormal left ventricular wall motion with mid to distal anterior wall akinesis, EF 45%, and elevated LVEDP consistent with acute combined systolic and diastolic heart failure.  RECOMMENDATIONS:   Risk factor modification: Blood pressure 130/80 mmHg or less, LDL cholesterol less than 70, check A1c, and encourage exercise when appropriate.  Add low-dose beta-blocker  IV nitroglycerin and IV heparin x24 hours then wean.  2D Doppler echocardiogram tomorrow.   EKG:  EKG is not ordered today.   Recent Labs: 05/21/2019: Magnesium 1.9 08/13/2019: ALT 20 12/02/2019: Hemoglobin 13.8; Platelets 291 12/31/2019: BUN 8; Creatinine, Ser 0.68; Potassium 3.7; Sodium 140; TSH 1.06  Recent Lipid Panel    Component Value Date/Time   CHOL 123 08/17/2019 0747   CHOL 109 06/27/2018 0812   TRIG 103 08/17/2019 0747   HDL 47 08/17/2019 0747   HDL 43 06/27/2018 0812   CHOLHDL 2.6 08/17/2019 0747   VLDL 21 08/17/2019 0747   LDLCALC 55 08/17/2019 0747   LDLCALC 43 06/27/2018 0812   LDLDIRECT 88.0 10/19/2016 0844    Physical Exam:    VS:  BP 126/64   Pulse 83   Ht 5' 7"  (1.702 m)   Wt 182 lb (82.6 kg)   SpO2 97%   BMI 28.51 kg/m     Wt  Readings from Last 3 Encounters:  04/25/20 182 lb (82.6 kg)  04/10/20 180 lb (81.6 kg)  02/07/20 179 lb 8 oz (81.4 kg)     GEN:  Well nourished, well developed in no acute distress HEENT: Normal NECK: No JVD; No carotid bruits LYMPHATICS: No lymphadenopathy CARDIAC: Regular rate and rhythm, 2/6 early peaking systolic ejection murmur at the right upper sternal border RESPIRATORY:  Clear to auscultation without rales, wheezing or rhonchi  ABDOMEN: Soft, non-tender, non-distended MUSCULOSKELETAL:  No edema; No deformity  SKIN: Warm and dry NEUROLOGIC:  Alert and oriented x 3 PSYCHIATRIC:  Normal affect   ASSESSMENT:    1. Takotsubo cardiomyopathy   2. Essential (primary) hypertension   3. Coronary artery disease involving native coronary artery of native heart without angina pectoris   4. Mixed hyperlipidemia    PLAN:    In order of problems listed above:  1. Doing well, recovery of LV function noted.  Continue beta-blocker.  Discussed stress reduction.  At the time of her acute event, she had been under a great deal of stress with a variety of issues going on.  Seems to be doing much better now. 2. Blood pressure is well controlled on amlodipine, carvedilol, isosorbide, and losartan. 3. No anginal symptoms on current regimen.  No changes made today.  She continues on clopidogrel in light of an aspirin allergy. 4. Lipids are at goal.  She continues on rosuvastatin.  LDL cholesterol is less than 70 mg/dL.  LFTs are normal.   Medication Adjustments/Labs and Tests Ordered: Current medicines are reviewed at length with the patient today.  Concerns regarding medicines are outlined above.  No orders of the defined types were placed in this encounter.  Meds ordered this encounter  Medications  . fluticasone (FLONASE) 50 MCG/ACT nasal spray    Sig: Place 1 spray into both nostrils daily.    Dispense:  16 g    Refill:  2    Patient Instructions  Medication Instructions:  Your  provider recommends that you continue on your current medications as directed. Please  refer to the Current Medication list given to you today.   *If you need a refill on your cardiac medications before your next appointment, please call your pharmacy*  Follow-Up: At [facility], you and your health needs are our priority.  As part of our continuing mission to provide you with exceptional heart care, we have created designated Provider Care Teams.  These Care Teams include your primary Cardiologist (physician) and Advanced Practice Providers (APPs -  Physician Assistants and Nurse Practitioners) who all work together to provide you with the care you need, when you need it. Your next appointment:   12 month(s) The format for your next appointment:   In Person Provider:   You may see [name], MD or one of the following Advanced Practice Providers on your designated Care Team:    Richardson Dopp, PA-C  Robbie Lis, [place]      Signed, [name], MD  04/25/2020 3:55 PM    [place]

## 2020-04-25 NOTE — Patient Instructions (Signed)

## 2020-05-19 ENCOUNTER — Ambulatory Visit (INDEPENDENT_AMBULATORY_CARE_PROVIDER_SITE_OTHER): Payer: Medicare Other | Admitting: Family Medicine

## 2020-05-19 ENCOUNTER — Ambulatory Visit: Payer: Self-pay

## 2020-05-19 ENCOUNTER — Other Ambulatory Visit: Payer: Self-pay

## 2020-05-19 ENCOUNTER — Ambulatory Visit (INDEPENDENT_AMBULATORY_CARE_PROVIDER_SITE_OTHER): Payer: Medicare Other

## 2020-05-19 ENCOUNTER — Encounter: Payer: Self-pay | Admitting: Family Medicine

## 2020-05-19 VITALS — BP 122/80 | HR 79 | Ht 67.0 in | Wt 182.6 lb

## 2020-05-19 DIAGNOSIS — M1712 Unilateral primary osteoarthritis, left knee: Secondary | ICD-10-CM | POA: Diagnosis not present

## 2020-05-19 DIAGNOSIS — M25562 Pain in left knee: Secondary | ICD-10-CM | POA: Diagnosis not present

## 2020-05-19 NOTE — Progress Notes (Signed)
I, Wendy Poet, LAT, ATC, am serving as scribe for Dr. Lynne Leader.  [name] is a 69 y.o. female who presents to [place] at [facility] today for f/u of L knee pain.  She was last seen by Dr. [name] on 01/29/20 for f/u after having a L knee injection on 11/20/19.  Since her last appt w/ Dr. [name], pt reports that her L knee has been flared up for about 2 weeks w/ no new MOI.  She reports some mild swelling in her L knee and some popping in her L knee.  She has not tried Voltaren gel yet.   Pertinent review of systems: No fevers or chills  Relevant historical information: Takotsubo cardiomyopathy   Exam:  BP 122/80 (BP Location: Left Arm, Patient Position: Sitting, Cuff Size: Normal)   Pulse 79   Ht 5' 7"  (1.702 m)   Wt 182 lb 9.6 oz (82.8 kg)   SpO2 96%   BMI 28.60 kg/m  General: Well Developed, well nourished, and in no acute distress.   MSK: Left knee small joint effusion. Tender palpation medial and lateral joint line. Full range of motion with crepitation. Stable ligamentous exam. Intact strength.    Lab and Radiology Results  X-ray images left knee obtained today personally and independently reviewed Moderate to severe medial compartment DJD.  Moderate lateral DJD.  Mild to moderate patellofemoral DJD.  No acute fractures. Await formal radiology review  Procedure: Real-time Ultrasound Guided Injection of left knee lateral superior patellar space Device: Philips Affiniti 50G Images permanently stored and available for review in the ultrasound unit. Verbal informed consent obtained.  Discussed risks and benefits of procedure. Warned about infection bleeding damage to structures skin hypopigmentation and fat atrophy among others. Patient expresses understanding and agreement Time-out conducted.   Noted no overlying erythema, induration, or other signs of local infection.   Skin prepped in a sterile fashion.   Local anesthesia: Topical Ethyl  chloride.   With sterile technique and under real time ultrasound guidance:  40 mg of Kenalog and 2 mL of Marcaine injected easily.   Completed without difficulty   Pain immediately resolved suggesting accurate placement of the medication.   Advised to call if fevers/chills, erythema, induration, drainage, or persistent bleeding.   Images permanently stored and available for review in the ultrasound unit.  Impression: Technically successful ultrasound guided injection.       Assessment and Plan: 69 y.o. female with left knee pain due to DJD.  Plan for injection today as well as topical Voltaren gel.  Recommend continued physical activity as tolerated.  Recheck back as needed.   PDMP not reviewed this encounter. Orders Placed This Encounter  Procedures  . [place] LIMITED JOINT SPACE STRUCTURES LOW LEFT(NO LINKED CHARGES)    Order Specific Question:   Reason for Exam (SYMPTOM  OR DIAGNOSIS REQUIRED)    Answer:   L knee pain    Order Specific Question:   Preferred imaging location?    Answer:   [facility]  . DG Knee AP/LAT W/Sun[place] Left    Standing Status:   Future    Standing Expiration Date:   05/19/2021    Order Specific Question:   Reason for Exam (SYMPTOM  OR DIAGNOSIS REQUIRED)    Answer:   eval knee pain    Order Specific Question:   Preferred imaging location?    Answer:   [facility]    Order Specific Question:  Radiology Contrast Protocol - do NOT remove file path    Answer:   [email or web address]   No orders of the defined types were placed in this encounter.    Discussed warning signs or symptoms. Please see discharge instructions. Patient expresses understanding.   The above documentation has been reviewed and is accurate and complete Lynne Leader, M.D.

## 2020-05-19 NOTE — Patient Instructions (Signed)
Thank you for coming in today. Plan for xray today.  Recheck as need with Dr Georgina Snell or Dr [name].  Try using over the counter voltaren gel on the knees up to 4x daily.  Ok to also take tylenol.   Stay active.

## 2020-05-20 DIAGNOSIS — R768 Other specified abnormal immunological findings in serum: Secondary | ICD-10-CM | POA: Diagnosis not present

## 2020-05-20 DIAGNOSIS — M2550 Pain in unspecified joint: Secondary | ICD-10-CM | POA: Diagnosis not present

## 2020-05-20 NOTE — Progress Notes (Signed)
X-ray left knee shows medium arthritis.

## 2020-05-21 ENCOUNTER — Other Ambulatory Visit (INDEPENDENT_AMBULATORY_CARE_PROVIDER_SITE_OTHER): Payer: Medicare Other

## 2020-05-21 ENCOUNTER — Other Ambulatory Visit: Payer: Self-pay

## 2020-05-21 ENCOUNTER — Encounter: Payer: Self-pay | Admitting: Internal Medicine

## 2020-05-21 ENCOUNTER — Ambulatory Visit (INDEPENDENT_AMBULATORY_CARE_PROVIDER_SITE_OTHER): Payer: Medicare Other | Admitting: Internal Medicine

## 2020-05-21 VITALS — BP 138/76 | HR 64 | Temp 98.5°F | Ht 67.0 in | Wt 181.5 lb

## 2020-05-21 DIAGNOSIS — L659 Nonscarring hair loss, unspecified: Secondary | ICD-10-CM | POA: Diagnosis not present

## 2020-05-21 DIAGNOSIS — J302 Other seasonal allergic rhinitis: Secondary | ICD-10-CM | POA: Diagnosis not present

## 2020-05-21 DIAGNOSIS — L680 Hirsutism: Secondary | ICD-10-CM | POA: Diagnosis not present

## 2020-05-21 DIAGNOSIS — E039 Hypothyroidism, unspecified: Secondary | ICD-10-CM

## 2020-05-21 DIAGNOSIS — I10 Essential (primary) hypertension: Secondary | ICD-10-CM

## 2020-05-21 LAB — TSH: TSH: 2.89 u[IU]/mL (ref 0.35–4.50)

## 2020-05-21 MED ORDER — FINASTERIDE 1 MG PO TABS: 1.0000 mg | ORAL_TABLET | Freq: Every day | ORAL | 1 refills | Status: DC

## 2020-05-21 MED ORDER — LEVOCETIRIZINE DIHYDROCHLORIDE 5 MG PO TABS: 5.0000 mg | ORAL_TABLET | Freq: Every evening | ORAL | 1 refills | Status: DC

## 2020-05-21 NOTE — Patient Instructions (Signed)
Hypothyroidism  Hypothyroidism is when the thyroid gland does not make enough of certain hormones (it is underactive). The thyroid gland is a small gland located in the lower front part of the neck, just in front of the windpipe (trachea). This gland makes hormones that help control how the body uses food for energy (metabolism) as well as how the heart and brain function. These hormones also play a role in keeping your bones strong. When the thyroid is underactive, it produces too little of the hormones thyroxine (T4) and triiodothyronine (T3). What are the causes? This condition may be caused by:  Hashimoto's disease. This is a disease in which the body's disease-fighting system (immune system) attacks the thyroid gland. This is the most common cause.  Viral infections.  Pregnancy.  Certain medicines.  Birth defects.  Past radiation treatments to the head or neck for cancer.  Past treatment with radioactive iodine.  Past exposure to radiation in the environment.  Past surgical removal of part or all of the thyroid.  Problems with a gland in the center of the brain (pituitary gland).  Lack of enough iodine in the diet. What increases the risk? You are more likely to develop this condition if:  You are female.  You have a family history of thyroid conditions.  You use a medicine called lithium.  You take medicines that affect the immune system (immunosuppressants). What are the signs or symptoms? Symptoms of this condition include:  Feeling as though you have no energy (lethargy).  Not being able to tolerate cold.  Weight gain that is not explained by a change in diet or exercise habits.  Lack of appetite.  Dry skin.  Coarse hair.  Menstrual irregularity.  Slowing of thought processes.  Constipation.  Sadness or depression. How is this diagnosed? This condition may be diagnosed based on:  Your symptoms, your medical history, and a physical exam.  Blood  tests. You may also have imaging tests, such as an ultrasound or MRI. How is this treated? This condition is treated with medicine that replaces the thyroid hormones that your body does not make. After you begin treatment, it may take several weeks for symptoms to go away. Follow these instructions at home:  Take over-the-counter and prescription medicines only as told by your health care provider.  If you start taking any new medicines, tell your health care provider.  Keep all follow-up visits as told by your health care provider. This is important. ? As your condition improves, your dosage of thyroid hormone medicine may change. ? You will need to have blood tests regularly so that your health care provider can monitor your condition. Contact a health care provider if:  Your symptoms do not get better with treatment.  You are taking thyroid replacement medicine and you: ? Sweat a lot. ? Have tremors. ? Feel anxious. ? Lose weight rapidly. ? Cannot tolerate heat. ? Have emotional swings. ? Have diarrhea. ? Feel weak. Get help right away if you have:  Chest pain.  An irregular heartbeat.  A rapid heartbeat.  Difficulty breathing. Summary  Hypothyroidism is when the thyroid gland does not make enough of certain hormones (it is underactive).  When the thyroid is underactive, it produces too little of the hormones thyroxine (T4) and triiodothyronine (T3).  The most common cause is Hashimoto's disease, a disease in which the body's disease-fighting system (immune system) attacks the thyroid gland. The condition can also be caused by viral infections, medicine, pregnancy, or past   radiation treatment to the head or neck.  Symptoms may include weight gain, dry skin, constipation, feeling as though you do not have energy, and not being able to tolerate cold.  This condition is treated with medicine to replace the thyroid hormones that your body does not make. This information  is not intended to replace advice given to you by your health care provider. Make sure you discuss any questions you have with your health care provider. Document Revised: 10/28/2017 Document Reviewed: 10/26/2017 Elsevier Patient Education  2020 [employer]vier Inc[employer]  

## 2020-05-21 NOTE — Progress Notes (Signed)
Subjective:  Patient ID: [name], female    DOB: 20-Mar-1951  Age: 69 y.o. MRN: [id number]  CC: Hypothyroidism and Hypertension  This visit occurred during the SARS-CoV-2 public health emergency.  Safety protocols were in place, including screening questions prior to the visit, additional usage of staff PPE, and extensive cleaning of exam room while observing appropriate contact time as indicated for disinfecting solutions.    HPI [name] presents for f/up - She complains of a 6-week history of hair thinning out on the top of her scalp.  She also has some hair on her chin.  She otherwise feels well and offers no complaints.  She is active and denies any recent episodes of chest pain, shortness of breath, palpitations, edema, or fatigue.  Outpatient Medications Prior to Visit  Medication Sig Dispense Refill  . amLODipine (NORVASC) 5 MG tablet Take 1 tablet (5 mg total) by mouth daily. 90 tablet 3  . carvedilol (COREG) 6.25 MG tablet Take 1 tablet (6.25 mg total) by mouth 2 (two) times daily with a meal. 180 tablet 3  . clopidogrel (PLAVIX) 75 MG tablet Take 1 tablet (75 mg total) by mouth daily with breakfast. 90 tablet 3  . famotidine (PEPCID) 40 MG tablet Take 1 tablet (40 mg total) by mouth daily. 90 tablet 1  . fluticasone (FLONASE) 50 MCG/ACT nasal spray Place 1 spray into both nostrils daily. 16 g 2  . guaiFENesin (MUCINEX) 600 MG 12 hr tablet Take 2 tablets (1,200 mg total) by mouth 2 (two) times daily as needed. 60 tablet 1  . isosorbide mononitrate (IMDUR) 30 MG 24 hr tablet Take 1 tablet (30 mg total) by mouth daily. 90 tablet 3  . levothyroxine (SYNTHROID) 88 MCG tablet TAKE 1 TABLET BY MOUTH EVERY DAY 90 tablet 1  . linaclotide (LINZESS) 145 MCG CAPS capsule Take 1 capsule (145 mcg total) by mouth daily before breakfast. (Patient taking differently: Take 145 mcg by mouth daily as needed. ) 90 capsule 1  . losartan (COZAAR) 25 MG tablet Take 1 tablet (25 mg total) by mouth  daily. 90 tablet 3  . meclizine (ANTIVERT) 25 MG tablet Take 1 tablet (25 mg total) by mouth 2 (two) times daily as needed for dizziness. 10 tablet 0  . nitroGLYCERIN (NITROSTAT) 0.4 MG SL tablet Place 1 tablet (0.4 mg total) under the tongue every 5 (five) minutes as needed for chest pain. 25 tablet 6  . pantoprazole (PROTONIX) 20 MG tablet Take 1 tablet (20 mg total) by mouth daily. 90 tablet 1  . rosuvastatin (CRESTOR) 10 MG tablet Take 1 tablet (10 mg total) by mouth daily. 90 tablet 3  . Vitamin D, Ergocalciferol, (DRISDOL) 1.25 MG (50000 UNIT) CAPS capsule TAKE 1 CAPSULE (50,000 UNITS TOTAL) BY MOUTH EVERY 7 (SEVEN) DAYS. 12 capsule 0   No facility-administered medications prior to visit.    ROS Review of Systems  HENT: Positive for congestion and rhinorrhea.        + popping sensation in her L ear    Objective:  BP 138/76 (BP Location: Left Arm, Patient Position: Sitting, Cuff Size: Normal)   Pulse 64   Temp 98.5 F (36.9 C) (Oral)   Ht 5' 7"  (1.702 m)   Wt 181 lb 8 oz (82.3 kg)   SpO2 96%   BMI 28.43 kg/m   BP Readings from Last 3 Encounters:  05/21/20 138/76  05/19/20 122/80  04/25/20 126/64    Wt Readings from Last 3  Encounters:  05/21/20 181 lb 8 oz (82.3 kg)  05/19/20 182 lb 9.6 oz (82.8 kg)  04/25/20 182 lb (82.6 kg)    Physical Exam Vitals reviewed.  Constitutional:      Appearance: Normal appearance.  HENT:     Right Ear: Hearing, tympanic membrane, ear canal and external ear normal.     Left Ear: Hearing, tympanic membrane, ear canal and external ear normal.     Nose: Nose normal. No mucosal edema, congestion or rhinorrhea.     Right Turbinates: Not pale.     Left Turbinates: Not pale.     Right Sinus: No maxillary sinus tenderness or frontal sinus tenderness.     Left Sinus: No maxillary sinus tenderness or frontal sinus tenderness.     Mouth/Throat:     Mouth: Mucous membranes are moist.  Eyes:     General: No scleral icterus.     Conjunctiva/sclera: Conjunctivae normal.  Cardiovascular:     Rate and Rhythm: Normal rate and regular rhythm.     Heart sounds: No murmur heard.   Pulmonary:     Effort: Pulmonary effort is normal.     Breath sounds: No stridor. No wheezing, rhonchi or rales.  Abdominal:     General: Abdomen is protuberant. Bowel sounds are normal. There is no distension.     Palpations: Abdomen is soft. There is no hepatomegaly, splenomegaly or mass.     Tenderness: There is no abdominal tenderness.  Musculoskeletal:        General: Normal range of motion.     Cervical back: Neck supple.     Right lower leg: No edema.     Left lower leg: No edema.  Lymphadenopathy:     Cervical: No cervical adenopathy.  Skin:    General: Skin is warm and dry.     Coloration: Skin is not pale.     Comments: She has female pattern hair thinning on the top of her scalp.  She also has a few terminal hairs on her chin.  There is no alopecia.  Neurological:     General: No focal deficit present.     Mental Status: She is alert.  Psychiatric:        Mood and Affect: Mood normal.        Behavior: Behavior normal.     Lab Results  Component Value Date   WBC 8.2 12/02/2019   HGB 13.8 12/02/2019   HCT 40.1 12/02/2019   PLT 291 12/02/2019   GLUCOSE 114 (H) 12/31/2019   CHOL 123 08/17/2019   TRIG 103 08/17/2019   HDL 47 08/17/2019   LDLDIRECT 88.0 10/19/2016   LDLCALC 55 08/17/2019   ALT 20 08/13/2019   AST 27 08/13/2019   NA 140 12/31/2019   K 3.7 12/31/2019   CL 103 12/31/2019   CREATININE 0.68 12/31/2019   BUN 8 12/31/2019   CO2 30 12/31/2019   TSH 2.89 05/21/2020   INR 1.01 05/08/2018   HGBA1C 6.2 12/31/2019    DG Chest 2 View  Result Date: 12/02/2019 CLINICAL DATA:  Chest pain EXAM: CHEST - 2 VIEW COMPARISON:  08/17/2019 FINDINGS: The heart size and mediastinal contours are within normal limits. Both lungs are clear. Mild degenerative changes of the spine. IMPRESSION: No active cardiopulmonary  disease. Electronically Signed   By: Donavan Foil M.D.   On: 12/02/2019 21:39    Assessment & Plan:   [name] was seen today for hypothyroidism and hypertension.  Diagnoses and all orders  for this visit:  Hirsutism- Her testosterone level is normal.  I recommended that she start taking a 5 alpha reductase inhibitor. -     Cancel: Testosterone,Free and Total -     finasteride (PROPECIA) 1 MG tablet; Take 1 tablet (1 mg total) by mouth daily. -     Testosterone, Free & Total-Female [facility]; Future -     Testosterone,Free and Total; Future  Balding- See above. -     Cancel: Testosterone,Free and Total -     finasteride (PROPECIA) 1 MG tablet; Take 1 tablet (1 mg total) by mouth daily. -     Testosterone, Free & Total-Female [facility]; Future -     Testosterone,Free and Total; Future  Essential hypertension- Her BP is well controlled  Acquired hypothyroidism- Her TSH is on the normal range. She will stay on the current T4 dose. -     TSH; Future  Seasonal allergic rhinitis, unspecified trigger- She has signs and symptoms of eustachian tube dysfunction.  I recommended that she upgrade to a more potent antihistamine. -     levocetirizine (XYZAL) 5 MG tablet; Take 1 tablet (5 mg total) by mouth every evening.   I am having [name] start on finasteride and levocetirizine. I am also having her maintain her linaclotide, meclizine, famotidine, pantoprazole, Vitamin D (Ergocalciferol), rosuvastatin, nitroGLYCERIN, losartan, isosorbide mononitrate, amLODipine, carvedilol, clopidogrel, levothyroxine, Mucinex, and fluticasone.  Meds ordered this encounter  Medications  . finasteride (PROPECIA) 1 MG tablet    Sig: Take 1 tablet (1 mg total) by mouth daily.    Dispense:  90 tablet    Refill:  1  . levocetirizine (XYZAL) 5 MG tablet    Sig: Take 1 tablet (5 mg total) by mouth every evening.    Dispense:  90 tablet    Refill:  1   I spent 50 minutes in preparing to see the patient by review of  recent labs, imaging and procedures, obtaining and reviewing separately obtained history, communicating with the patient and family or caregiver, ordering medications, tests or procedures, and documenting clinical information in the EHR including the differential Dx, treatment, and any further evaluation and other management of 1. Hirsutism 2. Balding 3. Essential hypertension 4. Acquired hypothyroidism 5. Seasonal allergic rhinitis, unspecified trigger     Follow-up: Return in about 3 months (around 08/21/2020).  [name], MD

## 2020-05-25 LAB — TESTOSTERONE,FREE AND TOTAL
Testosterone, Free: 0.3 pg/mL (ref 0.0–4.2)
Testosterone: 19 ng/dL (ref 3–67)

## 2020-05-26 ENCOUNTER — Other Ambulatory Visit: Payer: Self-pay | Admitting: Internal Medicine

## 2020-05-27 ENCOUNTER — Ambulatory Visit: Payer: Self-pay | Admitting: Pharmacist

## 2020-05-27 ENCOUNTER — Ambulatory Visit: Payer: Medicare Other

## 2020-05-27 DIAGNOSIS — E785 Hyperlipidemia, unspecified: Secondary | ICD-10-CM

## 2020-05-27 DIAGNOSIS — I10 Essential (primary) hypertension: Secondary | ICD-10-CM

## 2020-05-27 NOTE — Progress Notes (Signed)
  Chronic Care Management   Outreach Note  05/27/2020 Name: [name] MRN: [id number] DOB: 1950/12/21  Referred by: Emily Lima, MD Reason for referral : No chief complaint on file.   Reviewed chart for medication changes ahead of medication coordination call:  Office visits: 04/10/20 Dr [name] - otitis media, rx'd azithromycin, guaifenesin, prednisone. 04/25/20 Dr [name] (cardiology): BP controlled on 4 meds. No med changes. 05/19/20 Dr [name] (sports med): try OTC Voltaren gel, Tylenol. 05/21/20 Dr [name] OV: Hirsuitism - testosterone level normal. Start finasteride for hair loss. Change to levocetirizine for allergies.  Medications changes: -Start finasteride 1 mg daily -Start levocetirizine 5 mg HS  BP Readings from Last 3 Encounters:  05/21/20 138/76  05/19/20 122/80  04/25/20 126/64    Lab Results  Component Value Date   HGBA1C 6.2 12/31/2019     Medication management via [facility] pharmacy: Patient obtains medications through Adherence Packaging  90 Days   Patient is due for next adherence delivery on: 05/28/20 Called patient and reviewed medications and coordinated delivery.  This delivery to include:  Levothyroxine 32mg daily-Before Breakfast   Isosrb Mono 349mER daily-Breakfast    Losartan Potassium 2541maily-Breakfast    Clopidgrel 68m40mily-Breakfast   Pantoprazole 20mg59mly-Breakfast    Famotidine 40mg 2my-Breakfast    Amlodipine 5mg da50m-Breakfast    Carvedilol 6.25mg tw62mdaily -Breakfast, Bedtime    Rosuvastatin 10mg dai55medtime    Levocetirizi 5mg daily68mdtime   Vitamin D 50000 unit every Saturday-Vial   Patient declined finasteride as it is not covered by insurance, and she has found a homeopathic remedy (olive oil) that has helped with her balding.  Confirmed delivery date of 05/28/20, advised patient that pharmacy will contact them the morning of delivery.   [name]

## 2020-05-28 ENCOUNTER — Telehealth: Payer: Self-pay

## 2020-05-28 ENCOUNTER — Encounter: Payer: Self-pay | Admitting: Internal Medicine

## 2020-05-28 NOTE — Telephone Encounter (Signed)
Pt scheduled for previsit 06/25/20@8 :30am, colon scheduled in the [facility] 07/21/20@8am . Pt aware of appts.

## 2020-05-28 NOTE — Telephone Encounter (Signed)
See telephone contact on 04/04/20. Looks like patient canceled her scheduled colonoscopy for 04/08/20 due to not wanting to be COVID tested. I was never informed of the cancellation or need for new clearance. Looks like new instructions mailed to patient did not include when to hold Plavix.

## 2020-05-28 NOTE — Telephone Encounter (Signed)
See telephone note Pt will need to be rescheduled with appropriate plavix hold

## 2020-05-28 NOTE — Telephone Encounter (Signed)
I rescheduled the pt and discussed with her holding her plavix which was on her first set of instructions. I forgot to type that on the new set of instructions. Sorry that was my error.

## 2020-05-28 NOTE — Telephone Encounter (Signed)
Patient is scheduled to see Dr. Hilarie Fredrickson on 05/28/20 at 0800. Called patient at [phone] to see if she was running late to appointment. Patient stated that she called office yesterday to inquire about guidance for if she needed to stop taking her blood thinner prior to procedure. She stated that "someone in the office said they would call her back with cardiac clearance", but she never received a phone call. She did not stop taking blood thinner, and she did not prep for procedure. Patient stated that she would like to reschedule. Gave patient number to office to reschedule her procedure. Patient verbalized understanding.

## 2020-06-10 ENCOUNTER — Other Ambulatory Visit: Payer: Self-pay

## 2020-06-10 ENCOUNTER — Ambulatory Visit: Payer: Medicare Other | Admitting: Pharmacist

## 2020-06-10 DIAGNOSIS — I10 Essential (primary) hypertension: Secondary | ICD-10-CM

## 2020-06-10 DIAGNOSIS — I25118 Atherosclerotic heart disease of native coronary artery with other forms of angina pectoris: Secondary | ICD-10-CM

## 2020-06-10 DIAGNOSIS — E785 Hyperlipidemia, unspecified: Secondary | ICD-10-CM

## 2020-06-10 NOTE — Patient Instructions (Addendum)
Visit Information  Phone number for Pharmacist: [phone]  Goals Addressed            This Visit's Progress   . Pharmacy Care Plan       CARE PLAN ENTRY (see longitudinal plan of care for additional care plan information)  Current Barriers:  . Chronic Disease Management support, education, and care coordination needs related to Hypertension, Hyperlipidemia, and Coronary Artery Disease   Hypertension BP Readings from Last 3 Encounters:  05/21/20 138/76  05/19/20 122/80  04/25/20 126/64 .  Pharmacist Clinical Goal(s): o Over the next 30 days, patient will work with PharmD and providers to maintain BP goal <130/80 . Current regimen:  o amlodipine 5 mg daily AM,  o carvedilol 6.25 mg BID,  o losartan 25 mg daily,  o isosorbide MN 30 mg daily . Interventions: o Discussed BP goals and benefits of medication for prevention of heart attack / stroke o Discussed swelling may be a side effect of amlodipine; recommended to monitor for next 3 weeks until PCP appt and see if improves . Patient self care activities - Over the next 30 days, patient will: o Check BP twice daily, document, and provide at future appointments o Ensure daily salt intake < 2300 mg/day o Monitor lower leg swelling   Hyperlipidemia / Coronary artery disease Lab Results  Component Value Date/Time   LDLCALC 55 08/17/2019 07:47 AM   LDLCALC 43 06/27/2018 08:12 AM   LDLDIRECT 88.0 10/19/2016 08:44 AM .  Pharmacist Clinical Goal(s): o Over the next 30 days, patient will work with PharmD and providers to maintain LDL goal < 70 . Current regimen:  o rosuvastatin 10 mg daily,  o clopidogrel 75 mg daily,  o nitroglycerin 0.4 mg PRN  . Interventions: o Discussed cholesterol goals and benefits of medication for prevention of heart attack / stroke . Patient self care activities - Over the next 30 days, patient will: o Continue medication as prescribed o Continue low cholesterol diet  Cramping . Pharmacist  Clinical Goal(s) o Over the next 30 days, patient will work with PharmD and providers to optimize therapy . Current regimen:  o No medications . Interventions: o Discussed potential causes for cramping, including musculoskeletal overexertion, electrolyte abnormalities (low magnesium or potassium), dehydration o Discussed importance of hydration  o Recommended magnesium supplement for cramping . Patient self care activities - Over the next 30 days, patient will: o Take magnesium 400 mg at bedtime o Follow up with PCP as scheduled  Medication management . Pharmacist Clinical Goal(s): o Over the next 30 days, patient will work with PharmD and providers to achieve optimal medication adherence . Current pharmacy: [facility] . Interventions o Comprehensive medication review performed. o Utilize [facility] pharmacy for medication synchronization, packaging and delivery . Patient self care activities - Over the next 30 days, patient will: o Focus on medication adherence by pill pack o Take medications as prescribed o Report any questions or concerns to PharmD and/or provider(s)  Please see past updates related to this goal by clicking on the "Past Updates" button in the selected goal       Patient verbalizes understanding of instructions provided today.   Telephone follow up appointment with pharmacy team member scheduled for: 1 month  Charlene Brooke, PharmD Clinical Pharmacist [facility] [phone]  Leg Cramps Leg cramps occur when one or more muscles tighten and you have no control over this tightening (involuntary muscle contraction). Muscle cramps can develop in any muscle, but  the most common place is in the calf muscles of the leg. Those cramps can occur during exercise or when you are at rest. Leg cramps are painful, and they may last for a few seconds to a few minutes. Cramps may return several times before they finally stop. Usually, leg cramps are  not caused by a serious medical problem. In many cases, the cause is not known. Some common causes include:  Excessive physical effort (overexertion), such as during intense exercise.  Overuse from repetitive motions, or doing the same thing over and over.  Staying in a certain position for a long period of time.  Improper preparation, form, or technique while performing a sport or an activity.  Dehydration.  Injury.  Side effects of certain medicines.  Abnormally low levels of minerals in your blood (electrolytes), especially potassium and calcium. This could result from: ? Pregnancy. ? Taking diuretic medicines. Follow these instructions at home: Eating and drinking  Drink enough fluid to keep your urine pale yellow. Staying hydrated may help prevent cramps.  Eat a healthy diet that includes plenty of nutrients to help your muscles function. A healthy diet includes fruits and vegetables, lean protein, whole grains, and low-fat or nonfat dairy products. Managing pain, stiffness, and swelling      Try massaging, stretching, and relaxing the affected muscle. Do this for several minutes at a time.  If directed, put ice on areas that are sore or painful after a cramp: ? Put ice in a plastic bag. ? Place a towel between your skin and the bag. ? Leave the ice on for 20 minutes, 2-3 times a day.  If directed, apply heat to muscles that are tense or tight. Do this before you exercise, or as often as told by your health care provider. Use the heat source that your health care provider recommends, such as a moist heat pack or a heating pad. ? Place a towel between your skin and the heat source. ? Leave the heat on for 20-30 minutes. ? Remove the heat if your skin turns bright red. This is especially important if you are unable to feel pain, heat, or cold. You may have a greater risk of getting burned.  Try taking hot showers or baths to help relax tight muscles. General  instructions  If you are having frequent leg cramps, avoid intense exercise for several days.  Take over-the-counter and prescription medicines only as told by your health care provider.  Keep all follow-up visits as told by your health care provider. This is important. Contact a health care provider if:  Your leg cramps get more severe or more frequent, or they do not improve over time.  Your foot becomes cold, numb, or blue. Summary  Muscle cramps can develop in any muscle, but the most common place is in the calf muscles of the leg.  Leg cramps are painful, and they may last for a few seconds to a few minutes.  Usually, leg cramps are not caused by a serious medical problem. Often, the cause is not known.  Stay hydrated and take over-the-counter and prescription medicines only as told by your health care provider. This information is not intended to replace advice given to you by your health care provider. Make sure you discuss any questions you have with your health care provider. Document Revised: 10/28/2017 Document Reviewed: 08/25/2017 Elsevier Patient Education  2020 [employer].

## 2020-06-10 NOTE — Chronic Care Management (AMB) (Signed)
Chronic Care Management Pharmacy  Name: RAGHAD LORENZ  MRN: [id number] DOB: 11-16-1951  Chief Complaint/ HPI  [name],  69 y.o. , female presents for their Follow-Up CCM visit with the clinical pharmacist via telephone due to COVID-19 Pandemic.  PCP : [name], MD  Their chronic conditions include: HTN, CAD, HLD, GERD, hypothyroidism,  Prediabetes  Grew up in [place], [place], been in [place] for 30 years. 2 daughters, 6 grandkids, 6 grandkids, from [place] to [place]. Retired in 59. Husband died 01/01/17.   Office Visits: May 30, 2020 Dr [name] OV: Hirsuitism - testosterone level normal. Start finasteride for hair loss. Change to levocetirizine for allergies.  04/10/20 Dr Jenny Reichmann - otitis media, rx'd azithromycin, guaifenesin, prednisone.  12/31/19 Dr [name] OV: pt stable, recent ED for chest pain, determined related to GERD. Changed PPI to Dexilant, however pt could not afford so back to pantoprazole.  Consult Visit: 04/25/20 Dr [name] (cardiology): BP controlled on 4 meds. No med changes.  05/19/20 Dr Georgina Snell (sports med): try OTC Voltaren gel, Tylenol.  02/07/20 PA Lemmon (GI): scheduled colonoscopy. Pt to hold Plavix for 5 days prior.   01/29/20 Dr [name] (Sports med): zinc 30 mg daily x 1 month.   01/04/20 NP Phylliss Bob (cardiology): meds reviewed in depth w/ pt, no changes.  12/02/19 ED visit: chest pain, determined related to GERD. Improved with GI cocktail, somewhat worsened by NTG.  09/06/19 NP Phylliss Bob (cardiology): went over med changes from hospitalization on 9/18 - switched metoprolol to carvedilol, stopped indapamide and started amlodipine  Medications: Outpatient Encounter Medications as of 06/10/2020  Medication Sig Note  . amLODipine (NORVASC) 5 MG tablet Take 1 tablet (5 mg total) by mouth daily.   . carvedilol (COREG) 6.25 MG tablet Take 1 tablet (6.25 mg total) by mouth 2 (two) times daily with a meal.   . clopidogrel (PLAVIX) 75 MG tablet Take 1 tablet (75 mg total) by mouth daily with  breakfast.   . famotidine (PEPCID) 40 MG tablet Take 1 tablet (40 mg total) by mouth daily.   . fluticasone (FLONASE) 50 MCG/ACT nasal spray Place 1 spray into both nostrils daily.   Marland Kitchen guaiFENesin (MUCINEX) 600 MG 12 hr tablet Take 2 tablets (1,200 mg total) by mouth 2 (two) times daily as needed.   . isosorbide mononitrate (IMDUR) 30 MG 24 hr tablet Take 1 tablet (30 mg total) by mouth daily.   Marland Kitchen levocetirizine (XYZAL) 5 MG tablet Take 1 tablet (5 mg total) by mouth every evening.   Marland Kitchen levothyroxine (SYNTHROID) 88 MCG tablet TAKE 1 TABLET BY MOUTH EVERY DAY   . linaclotide (LINZESS) 145 MCG CAPS capsule Take 1 capsule (145 mcg total) by mouth daily before breakfast. (Patient taking differently: Take 145 mcg by mouth daily as needed. )   . losartan (COZAAR) 25 MG tablet Take 1 tablet (25 mg total) by mouth daily.   . meclizine (ANTIVERT) 25 MG tablet Take 1 tablet (25 mg total) by mouth 2 (two) times daily as needed for dizziness.   . nitroGLYCERIN (NITROSTAT) 0.4 MG SL tablet Place 1 tablet (0.4 mg total) under the tongue every 5 (five) minutes as needed for chest pain.   . pantoprazole (PROTONIX) 20 MG tablet Take 1 tablet (20 mg total) by mouth daily.   . rosuvastatin (CRESTOR) 10 MG tablet Take 1 tablet (10 mg total) by mouth daily.   . Vitamin D, Ergocalciferol, (DRISDOL) 1.25 MG (50000 UNIT) CAPS capsule TAKE ONE CAPSULE BY MOUTH ONCE A WEEK  ON SATURDAY   . finasteride (PROPECIA) 1 MG tablet Take 1 tablet (1 mg total) by mouth daily. (Patient not taking: Reported on 06/10/2020) 05/27/2020: Not covered by insurance  . [DISCONTINUED] levothyroxine (SYNTHROID) 88 MCG tablet Take 1 tablet (88 mcg total) by mouth daily.    No facility-administered encounter medications on file as of 06/10/2020.     Current Diagnosis/Assessment:  SDOH Interventions     Most Recent Value  SDOH Interventions  Financial Strain Interventions Intervention Not Indicated      Goals Addressed            This  Visit's Progress   . Pharmacy Care Plan       CARE PLAN ENTRY (see longitudinal plan of care for additional care plan information)  Current Barriers:  . Chronic Disease Management support, education, and care coordination needs related to Hypertension, Hyperlipidemia, and Coronary Artery Disease   Hypertension BP Readings from Last 3 Encounters:  05/21/20 138/76  05/19/20 122/80  04/25/20 126/64 .  Pharmacist Clinical Goal(s): o Over the next 30 days, patient will work with PharmD and providers to maintain BP goal <130/80 . Current regimen:  o amlodipine 5 mg daily AM,  o carvedilol 6.25 mg BID,  o losartan 25 mg daily,  o isosorbide MN 30 mg daily . Interventions: o Discussed BP goals and benefits of medication for prevention of heart attack / stroke o Discussed swelling may be a side effect of amlodipine; recommended to monitor for next 3 weeks until PCP appt and see if improves . Patient self care activities - Over the next 30 days, patient will: o Check BP twice daily, document, and provide at future appointments o Ensure daily salt intake < 2300 mg/day o Monitor lower leg swelling   Hyperlipidemia / Coronary artery disease Lab Results  Component Value Date/Time   LDLCALC 55 08/17/2019 07:47 AM   LDLCALC 43 06/27/2018 08:12 AM   LDLDIRECT 88.0 10/19/2016 08:44 AM .  Pharmacist Clinical Goal(s): o Over the next 30 days, patient will work with PharmD and providers to maintain LDL goal < 70 . Current regimen:  o rosuvastatin 10 mg daily,  o clopidogrel 75 mg daily,  o nitroglycerin 0.4 mg PRN  . Interventions: o Discussed cholesterol goals and benefits of medication for prevention of heart attack / stroke . Patient self care activities - Over the next 30 days, patient will: o Continue medication as prescribed o Continue low cholesterol diet  Cramping . Pharmacist Clinical Goal(s) o Over the next 30 days, patient will work with PharmD and providers to optimize  therapy . Current regimen:  o No medications . Interventions: o Discussed potential causes for cramping, including musculoskeletal overexertion, electrolyte abnormalities (low magnesium or potassium), dehydration o Discussed importance of hydration  o Recommended magnesium supplement for cramping . Patient self care activities - Over the next 30 days, patient will: o Take magnesium 400 mg at bedtime o Follow up with PCP as scheduled  Medication management . Pharmacist Clinical Goal(s): o Over the next 30 days, patient will work with PharmD and providers to achieve optimal medication adherence . Current pharmacy: [facility] . Interventions o Comprehensive medication review performed. o Utilize [facility] pharmacy for medication synchronization, packaging and delivery . Patient self care activities - Over the next 30 days, patient will: o Focus on medication adherence by pill pack o Take medications as prescribed o Report any questions or concerns to PharmD and/or provider(s)  Please see past updates related to this goal  by clicking on the "Past Updates" button in the selected goal        Hypertension   BP goal is:  <130/80  Office blood pressures are  BP Readings from Last 3 Encounters:  05/21/20 138/76  05/19/20 122/80  04/25/20 126/64   Kidney Function Lab Results  Component Value Date/Time   CREATININE 0.68 12/31/2019 08:54 AM   CREATININE 0.74 12/02/2019 09:13 PM   GFR 103.93 12/31/2019 08:54 AM   GFRNONAA >60 12/02/2019 09:13 PM   GFRAA >60 12/02/2019 09:13 PM   K 3.7 12/31/2019 08:54 AM   K 3.6 12/02/2019 09:13 PM   Lab Results  Component Value Date/Time   MG 1.9 05/21/2019 09:59 AM   Patient checks BP at home twice daily Patient home BP readings are ranging:  120/67 AM, 108/66 HR 94 138/72 HR 77  Patient has failed these meds in the past: metoprolol, indapamide, olmesartan, HCTZ, Bystolic, valsartan  Patient is currently controlled on the following  medications:   amlodipine 5 mg daily AM,   carvedilol 6.25 mg BID,   losartan 25 mg daily,   isosorbide MN 30 mg daily  We discussed diet and exercise extensively, pt avoids salt, greasy foods. Pt now c/o occasional swelling in ankles, cramps in lower leg. Stressed low salt diet. Discussed magnesium supplement for cramping.   Pt has PCP f/u in 3 wks - advised to continue monitoring BP and swelling side effect; if it does not improve consider d/c amlodipine.   Plan   Continue current medications and control with diet and exercise  Consider d/c amlodipine if swelling does not improve   Hyperlipidemia / CAD   Cath 05/08/2018: ACS c/w stress cardiomyopathy, largely patent vessels, no PCI, rec'd risk factor modification.    LDL goal < 70  Lipid Panel     Component Value Date/Time   CHOL 123 08/17/2019 0747   CHOL 109 06/27/2018 0812   TRIG 103 08/17/2019 0747   HDL 47 08/17/2019 0747   HDL 43 06/27/2018 0812   CHOLHDL 2.6 08/17/2019 0747   VLDL 21 08/17/2019 0747   LDLCALC 55 08/17/2019 0747   LDLCALC 43 06/27/2018 0812   LDLDIRECT 88.0 10/19/2016 0844   LABVLDL 23 06/27/2018 0812   Hepatic Function Latest Ref Rng & Units 08/13/2019 05/21/2019 03/20/2019  Total Protein 6.0 - 8.3 g/dL 7.7 7.2 7.3  Albumin 3.5 - 5.2 g/dL 4.2 4.1 4.0  AST 0 - 37 U/L 27 44(H) 24  ALT 0 - 35 U/L 20 34 17  Alk Phosphatase 39 - 117 U/L 87 87 94  Total Bilirubin 0.2 - 1.2 mg/dL 1.1 1.6(H) 0.8  Bilirubin, Direct 0.0 - 0.3 mg/dL 0.2 0.3 -   Patient has failed these meds in past: atorvastatin, pravastatin, pitavastatin  Patient is currently controlled on the following medications:   rosuvastatin 10 mg daily,   clopidogrel 75 mg daily,   nitroglycerin 0.4 mg PRN   We discussed:  diet and exercise extensively, cholesterol goals, benefits of statin and clopidogrel for secondary prevention. Pt has colonoscopy coming up and will be holding clopidogrel 1 week before.  Plan  Continue current  medications and control with diet and exercise    Hypothyroidism   TSH  Date Value Ref Range Status  05/21/2020 2.89 0.35 - 4.50 uIU/mL Final    Patient has failed these meds in past:  Patient is currently controlled on the following medications:   levothyroxine 88 mcg daily  We discussed: TSH is at goal, pt  denies s/sx hypothyroidism; no changes indicated  Plan  Continue current medications    GERD   Patient has failed these meds in past: Dexilant (cost) Patient is currently controlled on the following medications:   pantoprazole 20 mg daily,  famotidine 40 mg daily,   We discussed:  Pt denies issues with meds.  Plan  Continue current medications  Allergies   Patient has failed these meds in past: n/a Patient is currently controlled on the following medications:  . Levocetirizine 5 mg HS . Flonase nasal spray PRN  We discussed:  Patient is satisfied with current regimen and denies issues  Plan  Continue current medications  Knee pain   Patient has failed these meds in past: n/a Patient is currently controlled on the following medications:  . OTC Voltaren gel . Tylenol  We discussed:  Pt had knee injections recently and reports she has not needed additional medication since then.  Plan  Continue current medications  Health Maintenance   Lab Results  Component Value Date/Time   VD25OH 44.77 12/31/2019 08:54 AM   VD25OH 51.29 05/21/2019 09:59 AM   Patient is currently controlled on the following medications:   Vitamin D 50K IU weekly Sat,    meclizine 25 mg BID prn  We discussed:  Patient is satisfied with current OTC regimen and denies issues. Pt may be able to switch to maintenance dose of Vitamin D - 1000-2000 IU daily.  Plan  Continue current medications  Consider switch to maintenance dose of Vitamin D 2000 IU daily   Medication Management   Pt uses [facility] for all medications 90-day pill packs  We discussed:  Reviewed patient's [facility] medication and Epic medication profile assuring there are no discrepancies or gaps in therapy. Confirmed all fill dates appropriate and verified with patient that there is a sufficient quantity of all prescribed medications at home. Informed patient to call me any time if needing medications before scheduled deliveries. The medication sync date was 05/28/20.  Plan  Utilize [facility] pharmacy for medication synchronization, packaging and delivery     Follow up: 1 month phone visit  Charlene Brooke, PharmD Clinical Pharmacist [facility] [phone]

## 2020-06-25 ENCOUNTER — Ambulatory Visit (AMBULATORY_SURGERY_CENTER): Payer: Self-pay | Admitting: *Deleted

## 2020-06-25 ENCOUNTER — Other Ambulatory Visit: Payer: Self-pay

## 2020-06-25 VITALS — Ht 67.0 in | Wt 180.0 lb

## 2020-06-25 DIAGNOSIS — Z86010 Personal history of colonic polyps: Secondary | ICD-10-CM

## 2020-06-25 NOTE — Progress Notes (Addendum)
No egg or soy allergy known to patient  No issues with past sedation with any surgeries or procedures no intubation problems in the past  No diet pills per patient No home 02 use per patient  No blood thinners per patient  Pt denies issues with constipation  No A fib or A flutter  EMMI video to pt or MyChart  COVID 19 guidelines implemented in [facility] today   Patient already had Suprep from a previously cancelled appt.  Due to the COVID-19 pandemic we are asking patients to follow these guidelines. Please only bring one care partner. Please be aware that your care partner may wait in the car in the parking lot or if they feel like they will be too hot to wait in the car, they may wait in the lobby on the 4th floor. All care partners are required to wear a mask the entire time (we do not have any that we can provide them), they need to practice social distancing, and we will do a Covid check for all patient's and care partners when you arrive. Also we will check their temperature and your temperature. If the care partner waits in their car they need to stay in the parking lot the entire time and we will call them on their cell phone when the patient is ready for discharge so they can bring the car to the front of the building. Also all patient's will need to wear a mask into building.

## 2020-06-30 ENCOUNTER — Encounter: Payer: Self-pay | Admitting: Internal Medicine

## 2020-06-30 ENCOUNTER — Ambulatory Visit (INDEPENDENT_AMBULATORY_CARE_PROVIDER_SITE_OTHER): Payer: Medicare Other | Admitting: Internal Medicine

## 2020-06-30 ENCOUNTER — Other Ambulatory Visit: Payer: Self-pay

## 2020-06-30 VITALS — BP 130/82 | HR 70 | Temp 98.1°F | Ht 67.0 in | Wt 179.0 lb

## 2020-06-30 DIAGNOSIS — R7303 Prediabetes: Secondary | ICD-10-CM | POA: Diagnosis not present

## 2020-06-30 DIAGNOSIS — Z86010 Personal history of colonic polyps: Secondary | ICD-10-CM

## 2020-06-30 DIAGNOSIS — Z23 Encounter for immunization: Secondary | ICD-10-CM | POA: Diagnosis not present

## 2020-06-30 DIAGNOSIS — Z0000 Encounter for general adult medical examination without abnormal findings: Secondary | ICD-10-CM | POA: Diagnosis not present

## 2020-06-30 DIAGNOSIS — E876 Hypokalemia: Secondary | ICD-10-CM

## 2020-06-30 DIAGNOSIS — E785 Hyperlipidemia, unspecified: Secondary | ICD-10-CM

## 2020-06-30 DIAGNOSIS — I10 Essential (primary) hypertension: Secondary | ICD-10-CM

## 2020-06-30 DIAGNOSIS — E781 Pure hyperglyceridemia: Secondary | ICD-10-CM | POA: Diagnosis not present

## 2020-06-30 DIAGNOSIS — E559 Vitamin D deficiency, unspecified: Secondary | ICD-10-CM | POA: Diagnosis not present

## 2020-06-30 NOTE — Progress Notes (Signed)
Subjective:  Patient ID: [name], female    DOB: 31-Dec-1950  Age: 69 y.o. MRN: [id number]  CC: Hypertension  This visit occurred during the SARS-CoV-2 public health emergency.  Safety protocols were in place, including screening questions prior to the visit, additional usage of staff PPE, and extensive cleaning of exam room while observing appropriate contact time as indicated for disinfecting solutions.    HPI SELISA TENSLEY presents for f/up - She has been monitoring her BP and it has been well controlled.  She denies any recent episodes of chest pain, shortness of breath, palpitations, edema, or fatigue.  She has mild laryngitis but tells me the heartburn is well controlled.  She denies weight loss, loss of appetite, abdominal pain, odynophagia, dysphagia, melena, or bright red blood per rectum.  Outpatient Medications Prior to Visit  Medication Sig Dispense Refill  . amLODipine (NORVASC) 5 MG tablet Take 1 tablet (5 mg total) by mouth daily. 90 tablet 3  . carvedilol (COREG) 6.25 MG tablet Take 1 tablet (6.25 mg total) by mouth 2 (two) times daily with a meal. 180 tablet 3  . clopidogrel (PLAVIX) 75 MG tablet Take 1 tablet (75 mg total) by mouth daily with breakfast. 90 tablet 3  . famotidine (PEPCID) 40 MG tablet Take 1 tablet (40 mg total) by mouth daily. 90 tablet 1  . fluticasone (FLONASE) 50 MCG/ACT nasal spray Place 1 spray into both nostrils daily. 16 g 2  . guaiFENesin (MUCINEX) 600 MG 12 hr tablet Take 2 tablets (1,200 mg total) by mouth 2 (two) times daily as needed. 60 tablet 1  . isosorbide mononitrate (IMDUR) 30 MG 24 hr tablet Take 1 tablet (30 mg total) by mouth daily. 90 tablet 3  . levocetirizine (XYZAL) 5 MG tablet Take 1 tablet (5 mg total) by mouth every evening. 90 tablet 1  . levothyroxine (SYNTHROID) 88 MCG tablet TAKE 1 TABLET BY MOUTH EVERY DAY 90 tablet 1  . linaclotide (LINZESS) 145 MCG CAPS capsule Take 1 capsule (145 mcg total) by mouth daily before  breakfast. (Patient taking differently: Take 145 mcg by mouth daily as needed. ) 90 capsule 1  . losartan (COZAAR) 25 MG tablet Take 1 tablet (25 mg total) by mouth daily. 90 tablet 3  . meclizine (ANTIVERT) 25 MG tablet Take 1 tablet (25 mg total) by mouth 2 (two) times daily as needed for dizziness. 10 tablet 0  . Na Sulfate-K Sulfate-Mg Sulf (SUPREP BOWEL PREP KIT PO) Take 1 kit by mouth once. Colonoscopy bowel prep  ( already had kit from prev. Cancelled colon)    . nitroGLYCERIN (NITROSTAT) 0.4 MG SL tablet Place 1 tablet (0.4 mg total) under the tongue every 5 (five) minutes as needed for chest pain. 25 tablet 6  . pantoprazole (PROTONIX) 20 MG tablet Take 1 tablet (20 mg total) by mouth daily. 90 tablet 1  . rosuvastatin (CRESTOR) 10 MG tablet Take 1 tablet (10 mg total) by mouth daily. 90 tablet 3  . Vitamin D, Ergocalciferol, (DRISDOL) 1.25 MG (50000 UNIT) CAPS capsule TAKE ONE CAPSULE BY MOUTH ONCE A WEEK ON SATURDAY 12 capsule 0  . finasteride (PROPECIA) 1 MG tablet Take 1 tablet (1 mg total) by mouth daily. (Patient not taking: Reported on 06/10/2020) 90 tablet 1   No facility-administered medications prior to visit.    ROS Review of Systems  Constitutional: Negative for appetite change, diaphoresis, fatigue and unexpected weight change.  HENT: Positive for voice change. Negative for trouble  swallowing.   Eyes: Negative.   Respiratory: Negative for cough, chest tightness, shortness of breath and wheezing.   Cardiovascular: Negative for chest pain, palpitations and leg swelling.  Gastrointestinal: Negative for abdominal pain, blood in stool, constipation, diarrhea, nausea and vomiting.  Endocrine: Negative.  Negative for cold intolerance and heat intolerance.  Genitourinary: Negative.  Negative for difficulty urinating.  Musculoskeletal: Negative.  Negative for arthralgias and myalgias.  Skin: Negative.  Negative for color change and pallor.  Neurological: Negative for dizziness,  weakness, light-headedness, numbness and headaches.  Hematological: Negative for adenopathy. Does not bruise/bleed easily.  Psychiatric/Behavioral: Negative.     Objective:  BP (!) 130/82 (BP Location: Left Arm, Patient Position: Sitting, Cuff Size: Large)   Pulse 70   Temp 98.1 F (36.7 C) (Oral)   Ht 5' 7" (1.702 m)   Wt 179 lb (81.2 kg)   SpO2 97%   BMI 28.04 kg/m   BP Readings from Last 3 Encounters:  06/30/20 (!) 130/82  05/21/20 138/76  05/19/20 122/80    Wt Readings from Last 3 Encounters:  06/30/20 179 lb (81.2 kg)  06/25/20 180 lb (81.6 kg)  05/21/20 181 lb 8 oz (82.3 kg)    Physical Exam Vitals reviewed.  Constitutional:      Appearance: Normal appearance.  HENT:     Nose: Nose normal.     Mouth/Throat:     Mouth: Mucous membranes are moist.  Eyes:     General: No scleral icterus.    Conjunctiva/sclera: Conjunctivae normal.  Cardiovascular:     Rate and Rhythm: Normal rate and regular rhythm.     Heart sounds: No murmur heard.   Pulmonary:     Effort: Pulmonary effort is normal.     Breath sounds: No stridor. No wheezing, rhonchi or rales.  Abdominal:     General: Abdomen is flat. Bowel sounds are normal. There is no distension.     Palpations: Abdomen is soft. There is no hepatomegaly, splenomegaly or mass.     Tenderness: There is no abdominal tenderness.  Musculoskeletal:        General: Normal range of motion.     Cervical back: Neck supple.     Right lower leg: No edema.     Left lower leg: No edema.  Lymphadenopathy:     Cervical: No cervical adenopathy.  Skin:    General: Skin is warm and dry.     Coloration: Skin is not pale.  Neurological:     General: No focal deficit present.     Mental Status: She is alert.  Psychiatric:        Mood and Affect: Mood normal.        Behavior: Behavior normal.     Lab Results  Component Value Date   WBC 8.2 12/02/2019   HGB 13.8 12/02/2019   HCT 40.1 12/02/2019   PLT 291 12/02/2019    GLUCOSE 128 (H) 06/30/2020   CHOL 137 06/30/2020   TRIG 140 06/30/2020   HDL 52 06/30/2020   LDLDIRECT 88.0 10/19/2016   LDLCALC 63 06/30/2020   ALT 9 06/30/2020   AST 15 06/30/2020   NA 140 06/30/2020   K 3.4 (L) 06/30/2020   CL 102 06/30/2020   CREATININE 0.82 06/30/2020   BUN 8 06/30/2020   CO2 29 06/30/2020   TSH 2.89 05/21/2020   INR 1.01 05/08/2018   HGBA1C 5.9 (H) 06/30/2020    DG Chest 2 View  Result Date: 12/02/2019 CLINICAL DATA:  Chest  pain EXAM: CHEST - 2 VIEW COMPARISON:  08/17/2019 FINDINGS: The heart size and mediastinal contours are within normal limits. Both lungs are clear. Mild degenerative changes of the spine. IMPRESSION: No active cardiopulmonary disease. Electronically Signed   By: Kim  Fujinaga M.D.   On: 12/02/2019 21:39    Assessment & Plan:   Gini was seen today for hypertension.  Diagnoses and all orders for this visit:  Essential hypertension-her blood pressure is adequately well controlled.  She has developed hypokalemia but her other electrolytes are normal. -     BASIC METABOLIC PANEL WITH GFR; Future -     BASIC METABOLIC PANEL WITH GFR -     potassium chloride SA (KLOR-CON) 20 MEQ tablet; Take 1 tablet (20 mEq total) by mouth 2 (two) times daily.  Hyperlipidemia LDL goal <70-she has achieved her LDL goal and is doing well on the statin. -     Lipid panel; Future -     Hepatic function panel; Future -     Hepatic function panel -     Lipid panel  Pure hyperglyceridemia-her triglycerides are normal now. -     Lipid panel; Future -     Lipid panel  Vitamin D deficiency-her vitamin D level is normal now. -     VITAMIN D 25 Hydroxy (Vit-D Deficiency, Fractures); Future -     VITAMIN D 25 Hydroxy (Vit-D Deficiency, Fractures)  Prediabetes-her A1c is at 5.9%.  Medical therapy is not indicated. -     Hemoglobin A1c; Future -     BASIC METABOLIC PANEL WITH GFR; Future -     BASIC METABOLIC PANEL WITH GFR -     Hemoglobin A1c  Need for  Tdap vaccination -     Tdap vaccine greater than or equal to 7yo IM  Hypokalemia due to inadequate potassium intake -     potassium chloride SA (KLOR-CON) 20 MEQ tablet; Take 1 tablet (20 mEq total) by mouth 2 (two) times daily.  Routine general medical examination at a health care facility- Exam completed, labs reviewed, vaccines reviewed and updated, screening for breast and cervical cancer up-to-date, she is referred for follow-up colonoscopy regarding a history of polyps, patient education material was given.  Personal history of colonic polyps -     Ambulatory referral to Gastroenterology   I have discontinued Djuna J. Belvin's finasteride. I am also having her start on potassium chloride SA. Additionally, I am having her maintain her linaclotide, meclizine, famotidine, pantoprazole, rosuvastatin, nitroGLYCERIN, losartan, isosorbide mononitrate, amLODipine, carvedilol, clopidogrel, levothyroxine, Mucinex, fluticasone, levocetirizine, Vitamin D (Ergocalciferol), and Na Sulfate-K Sulfate-Mg Sulf (SUPREP BOWEL PREP KIT PO).  Meds ordered this encounter  Medications  . potassium chloride SA (KLOR-CON) 20 MEQ tablet    Sig: Take 1 tablet (20 mEq total) by mouth 2 (two) times daily.    Dispense:  180 tablet    Refill:  1   In addition to time spent on CPE, I spent 50 minutes in preparing to see the patient by review of recent labs, imaging and procedures, obtaining and reviewing separately obtained history, communicating with the patient and family or caregiver, ordering medications, tests or procedures, and documenting clinical information in the EHR including the differential Dx, treatment, and any further evaluation and other management of 1. Essential hypertension 2. Hyperlipidemia LDL goal <70 3. Pure hyperglyceridemia 4. Vitamin D deficiency 5. Prediabetes 6. Hypokalemia due to inadequate potassium intake 7. Personal history of colonic polyps     Follow-up: Return in   about 6 months  (around 12/31/2020).  Thomas Jones, MD 

## 2020-06-30 NOTE — Patient Instructions (Signed)

## 2020-07-01 ENCOUNTER — Encounter: Payer: Self-pay | Admitting: Internal Medicine

## 2020-07-01 DIAGNOSIS — Z23 Encounter for immunization: Secondary | ICD-10-CM | POA: Insufficient documentation

## 2020-07-01 DIAGNOSIS — E876 Hypokalemia: Secondary | ICD-10-CM | POA: Insufficient documentation

## 2020-07-01 LAB — HEMOGLOBIN A1C
Hgb A1c MFr Bld: 5.9 % of total Hgb — ABNORMAL HIGH (ref <5.7)
Mean Plasma Glucose: 123 (calc)
eAG (mmol/L): 6.8 (calc)

## 2020-07-01 LAB — HEPATIC FUNCTION PANEL
AG Ratio: 1.4 (calc) (ref 1.0–2.5)
ALT: 9 U/L (ref 6–29)
AST: 15 U/L (ref 10–35)
Albumin: 4.1 g/dL (ref 3.6–5.1)
Alkaline phosphatase (APISO): 72 U/L (ref 37–153)
Bilirubin, Direct: 0.3 mg/dL — ABNORMAL HIGH (ref 0.0–0.2)
Globulin: 3 g/dL (calc) (ref 1.9–3.7)
Indirect Bilirubin: 1 mg/dL (calc) (ref 0.2–1.2)
Total Bilirubin: 1.3 mg/dL — ABNORMAL HIGH (ref 0.2–1.2)
Total Protein: 7.1 g/dL (ref 6.1–8.1)

## 2020-07-01 LAB — BASIC METABOLIC PANEL WITH GFR
BUN: 8 mg/dL (ref 7–25)
CO2: 29 mmol/L (ref 20–32)
Calcium: 9.6 mg/dL (ref 8.6–10.4)
Chloride: 102 mmol/L (ref 98–110)
Creat: 0.82 mg/dL (ref 0.50–0.99)
GFR, Est African American: 85 mL/min/{1.73_m2} (ref >=60)
GFR, Est Non African American: 73 mL/min/{1.73_m2} (ref >=60)
Glucose, Bld: 128 mg/dL — ABNORMAL HIGH (ref 65–99)
Potassium: 3.4 mmol/L — ABNORMAL LOW (ref 3.5–5.3)
Sodium: 140 mmol/L (ref 135–146)

## 2020-07-01 LAB — LIPID PANEL
Cholesterol: 137 mg/dL (ref <200)
HDL: 52 mg/dL (ref >=50)
LDL Cholesterol (Calc): 63 mg/dL (calc)
Non-HDL Cholesterol (Calc): 85 mg/dL (calc) (ref <130)
Total CHOL/HDL Ratio: 2.6 (calc) (ref <5.0)
Triglycerides: 140 mg/dL (ref <150)

## 2020-07-01 LAB — VITAMIN D 25 HYDROXY (VIT D DEFICIENCY, FRACTURES): Vit D, 25-Hydroxy: 76 ng/mL (ref 30–100)

## 2020-07-01 MED ORDER — POTASSIUM CHLORIDE CRYS ER 20 MEQ PO TBCR: 20.0000 meq | EXTENDED_RELEASE_TABLET | Freq: Two times a day (BID) | ORAL | 1 refills | Status: DC

## 2020-07-03 ENCOUNTER — Ambulatory Visit: Payer: Medicare Other | Admitting: Pharmacist

## 2020-07-03 ENCOUNTER — Other Ambulatory Visit: Payer: Self-pay

## 2020-07-03 DIAGNOSIS — I25118 Atherosclerotic heart disease of native coronary artery with other forms of angina pectoris: Secondary | ICD-10-CM

## 2020-07-03 DIAGNOSIS — I10 Essential (primary) hypertension: Secondary | ICD-10-CM

## 2020-07-03 DIAGNOSIS — E785 Hyperlipidemia, unspecified: Secondary | ICD-10-CM

## 2020-07-03 NOTE — Chronic Care Management (AMB) (Signed)
Chronic Care Management Pharmacy  Name: [name]  MRN: [id number] DOB: 02-07-1951  Chief Complaint/ HPI  [name],  69 y.o. , female presents for their Follow-Up CCM visit with the clinical pharmacist via telephone.  PCP : Emily Lima, MD  Their chronic conditions include: Hypertension, Hyperlipidemia, Coronary Artery Disease, GERD, Hypothyroidism and Prediabetes  [name] grew up in [place], [place], and has been in [place] for 30 years. She has 2 daughters, 6 grandkids, from [place] to [facility]. She retired in 1994, and her Husband passed in 2018.   Office Visits: 06/30/20 Dr [name] OV: added potassium 20 mEq BID (K 3.4).  05/21/20 Dr [name] OV: Hirsuitism - testosterone level normal. Start finasteride for hair loss. Change to levocetirizine for allergies.  04/10/20 Dr Jenny Reichmann - otitis media, rx'd azithromycin, guaifenesin, prednisone.  12/31/19 Dr [name] OV: pt stable, recent ED for chest pain, determined related to GERD. Changed PPI to Dexilant, however pt could not afford so back to pantoprazole.  Consult Visit: 04/25/20 Dr [name] (cardiology): BP controlled on 4 meds. No med changes.  05/19/20 Dr Georgina Snell (sports med): try OTC Voltaren gel, Tylenol.  02/07/20 PA Lemmon (GI): scheduled colonoscopy. Pt to hold Plavix for 5 days prior.   01/29/20 Dr [name] (Sports med): zinc 30 mg daily x 1 month.   01/04/20 NP Phylliss Bob (cardiology): meds reviewed in depth w/ pt, no changes.  12/02/19 ED visit: chest pain, determined related to GERD. Improved with GI cocktail, somewhat worsened by NTG.  09/06/19 NP Phylliss Bob (cardiology): went over med changes from hospitalization on 9/18 - switched metoprolol to carvedilol, stopped indapamide and started amlodipine  Medications: Outpatient Encounter Medications as of 07/03/2020  Medication Sig  . amLODipine (NORVASC) 5 MG tablet Take 1 tablet (5 mg total) by mouth daily.  . carvedilol (COREG) 6.25 MG tablet Take 1 tablet (6.25 mg total) by mouth 2 (two) times daily  with a meal.  . clopidogrel (PLAVIX) 75 MG tablet Take 1 tablet (75 mg total) by mouth daily with breakfast.  . famotidine (PEPCID) 40 MG tablet Take 1 tablet (40 mg total) by mouth daily.  . fluticasone (FLONASE) 50 MCG/ACT nasal spray Place 1 spray into both nostrils daily.  Marland Kitchen guaiFENesin (MUCINEX) 600 MG 12 hr tablet Take 2 tablets (1,200 mg total) by mouth 2 (two) times daily as needed.  . isosorbide mononitrate (IMDUR) 30 MG 24 hr tablet Take 1 tablet (30 mg total) by mouth daily.  Marland Kitchen levocetirizine (XYZAL) 5 MG tablet Take 1 tablet (5 mg total) by mouth every evening.  Marland Kitchen levothyroxine (SYNTHROID) 88 MCG tablet TAKE 1 TABLET BY MOUTH EVERY DAY  . linaclotide (LINZESS) 145 MCG CAPS capsule Take 1 capsule (145 mcg total) by mouth daily before breakfast. (Patient taking differently: Take 145 mcg by mouth daily as needed. )  . losartan (COZAAR) 25 MG tablet Take 1 tablet (25 mg total) by mouth daily.  . meclizine (ANTIVERT) 25 MG tablet Take 1 tablet (25 mg total) by mouth 2 (two) times daily as needed for dizziness.  . Na Sulfate-K Sulfate-Mg Sulf (SUPREP BOWEL PREP KIT PO) Take 1 kit by mouth once. Colonoscopy bowel prep  ( already had kit from prev. Cancelled colon)  . nitroGLYCERIN (NITROSTAT) 0.4 MG SL tablet Place 1 tablet (0.4 mg total) under the tongue every 5 (five) minutes as needed for chest pain.  . pantoprazole (PROTONIX) 20 MG tablet Take 1 tablet (20 mg total) by mouth daily.  . potassium chloride SA (  KLOR-CON) 20 MEQ tablet Take 1 tablet (20 mEq total) by mouth 2 (two) times daily.  . rosuvastatin (CRESTOR) 10 MG tablet Take 1 tablet (10 mg total) by mouth daily.  . Vitamin D, Ergocalciferol, (DRISDOL) 1.25 MG (50000 UNIT) CAPS capsule TAKE ONE CAPSULE BY MOUTH ONCE A WEEK ON SATURDAY  . [DISCONTINUED] levothyroxine (SYNTHROID) 88 MCG tablet Take 1 tablet (88 mcg total) by mouth daily.   No facility-administered encounter medications on file as of 07/03/2020.     Current  Diagnosis/Assessment:    Goals Addressed            This Visit's Progress   . Pharmacy Care Plan       CARE PLAN ENTRY (see longitudinal plan of care for additional care plan information)  Current Barriers:  . Chronic Disease Management support, education, and care coordination needs related to Hypertension, Hyperlipidemia, and Coronary Artery Disease   Hypertension BP Readings from Last 3 Encounters:  05/21/20 138/76  05/19/20 122/80  04/25/20 126/64 .  Pharmacist Clinical Goal(s): o Over the next 365 days, patient will work with PharmD and providers to maintain BP goal <130/80 . Current regimen:  o amlodipine 5 mg daily AM,  o carvedilol 6.25 mg BID,  o losartan 25 mg daily,  o isosorbide MN 30 mg daily . Interventions: o Discussed BP goals and benefits of medication for prevention of heart attack / stroke o Discussed swelling may be a side effect of amlodipine; recommended to monitor for next 3 weeks until PCP appt and see if improves . Patient self care activities - Over the next 365 days, patient will: o Check BP twice daily, document, and provide at future appointments o Ensure daily salt intake < 2300 mg/day o Monitor lower leg swelling   Hyperlipidemia / Coronary artery disease Lab Results  Component Value Date/Time   LDLCALC 55 08/17/2019 07:47 AM   LDLCALC 43 06/27/2018 08:12 AM   LDLDIRECT 88.0 10/19/2016 08:44 AM .  Pharmacist Clinical Goal(s): o Over the next 365 days, patient will work with PharmD and providers to maintain LDL goal < 70 . Current regimen:  o rosuvastatin 10 mg daily,  o clopidogrel 75 mg daily,  o nitroglycerin 0.4 mg PRN  . Interventions: o Discussed cholesterol goals and benefits of medication for prevention of heart attack / stroke . Patient self care activities - Over the next 365 days, patient will: o Continue medication as prescribed o Continue low cholesterol diet  Medication management . Pharmacist Clinical  Goal(s): o Over the next 365 days, patient will work with PharmD and providers to achieve optimal medication adherence . Current pharmacy: [facility] . Interventions o Comprehensive medication review performed. o Utilize [facility] pharmacy for medication synchronization, packaging and delivery . Patient self care activities - Over the next 365 days, patient will: o Focus on medication adherence by pill pack o Take medications as prescribed o Report any questions or concerns to PharmD and/or provider(s)  Please see past updates related to this goal by clicking on the "Past Updates" button in the selected goal        Hypertension   BP goal is:  <130/80  Office blood pressures are  BP Readings from Last 3 Encounters:  06/30/20 (!) 130/82  05/21/20 138/76  05/19/20 122/80   Kidney Function Lab Results  Component Value Date/Time   CREATININE 0.82 06/30/2020 08:43 AM   CREATININE 0.68 12/31/2019 08:54 AM   CREATININE 0.74 12/02/2019 09:13 PM   GFR 103.93 12/31/2019 08:54  AM   GFRNONAA 73 06/30/2020 08:43 AM   GFRAA 85 06/30/2020 08:43 AM   K 3.4 (L) 06/30/2020 08:43 AM   K 3.7 12/31/2019 08:54 AM   Patient checks BP at home daily Patient home BP readings are ranging: 127/75, 133/85, 128/74  Patient has failed these meds in the past: metoprolol, indapamide, olmesartan, HCTZ, Bystolic, valsartan  Patient is currently controlled on the following medications:   amlodipine 5 mg daily AM,   carvedilol 6.25 mg BID,   losartan 25 mg daily,   isosorbide MN 30 mg daily  Potassium 20 mEq BID  We discussed: pt reports leg cramps have completely resolved; her BP at home is controlled; she denies issues or side effects with medications, no longer complaining of swelling.  Pt has new potassium supplement due to low K at PCP visit; discussed consequences of low potassium and goal range.  Plan   Continue current medications and control with diet and exercise   Hyperlipidemia /  CAD   Cath 05/08/2018: ACS c/w stress cardiomyopathy, largely patent vessels, no PCI, rec'd risk factor modification.    LDL goal < 70  Lipid Panel     Component Value Date/Time   CHOL 137 06/30/2020 0843   CHOL 109 06/27/2018 0812   TRIG 140 06/30/2020 0843   HDL 52 06/30/2020 0843   HDL 43 06/27/2018 0812   CHOLHDL 2.6 06/30/2020 0843   VLDL 21 08/17/2019 0747   LDLCALC 63 06/30/2020 0843   LDLDIRECT 88.0 10/19/2016 0844   LABVLDL 23 06/27/2018 0812   Hepatic Function Latest Ref Rng & Units 06/30/2020 08/13/2019 05/21/2019  Total Protein 6.1 - 8.1 g/dL 7.1 7.7 7.2  Albumin 3.5 - 5.2 g/dL - 4.2 4.1  AST 10 - 35 U/L 15 27 44(H)  ALT 6 - 29 U/L 9 20 34  Alk Phosphatase 39 - 117 U/L - 87 87  Total Bilirubin 0.2 - 1.2 mg/dL 1.3(H) 1.1 1.6(H)  Bilirubin, Direct 0.0 - 0.2 mg/dL 0.3(H) 0.2 0.3   Patient has failed these meds in past: atorvastatin, pravastatin, pitavastatin  Patient is currently controlled on the following medications:   rosuvastatin 10 mg daily,   clopidogrel 75 mg daily,   nitroglycerin 0.4 mg PRN   We discussed:  Pt endorses compliance with medications and denies side effects  Plan  Continue current medications and control with diet and exercise   Hypothyroidism   TSH  Date Value Ref Range Status  05/21/2020 2.89 0.35 - 4.50 uIU/mL Final    Patient has failed these meds in past:  Patient is currently controlled on the following medications:   levothyroxine 88 mcg daily  We discussed: TSH is at goal, pt denies s/sx hypothyroidism; no changes indicated  Plan  Continue current medications   Health Maintenance   Lab Results  Component Value Date/Time   VD25OH 76 06/30/2020 08:43 AM   VD25OH 44.77 12/31/2019 08:54 AM   VD25OH 51.29 05/21/2019 09:59 AM   Patient is currently controlled on the following medications:   Vitamin D2 50,000 IU weekly Sat  meclizine 25 mg BID prn  We discussed:  Patient is satisfied with current OTC regimen and  denies issues. Pt may be able to switch to maintenance dose of Vitamin D - 1000-2000 IU daily.  Plan  Continue current medications  Consider switch to maintenance dose of Vitamin D 2000 IU daily  Medication Management   Pt uses [facility] for all medications 90-day pill packs  We discussed: Reviewed patient's  [facility] medication and Epic medication profile assuring there are no discrepancies or gaps in therapy. Confirmed all fill dates appropriate and verified with patient that there is a sufficient quantity of all prescribed medications at home. Informed patient to call me any time if needing medications before scheduled deliveries. The medication sync date was 05/28/20.  Plan  Utilize [facility] pharmacy for medication synchronization, packaging and delivery     Follow up: 8 month phone visit  Charlene Brooke, PharmD Clinical Pharmacist [facility] [phone]

## 2020-07-03 NOTE — Patient Instructions (Addendum)
Visit Information  Phone number for Pharmacist: [phone]  Goals Addressed            This Visit's Progress   . Pharmacy Care Plan       CARE PLAN ENTRY (see longitudinal plan of care for additional care plan information)  Current Barriers:  . Chronic Disease Management support, education, and care coordination needs related to Hypertension, Hyperlipidemia, and Coronary Artery Disease   Hypertension BP Readings from Last 3 Encounters:  05/21/20 138/76  05/19/20 122/80  04/25/20 126/64 .  Pharmacist Clinical Goal(s): o Over the next 365 days, patient will work with PharmD and providers to maintain BP goal <130/80 . Current regimen:  o amlodipine 5 mg daily AM,  o carvedilol 6.25 mg BID,  o losartan 25 mg daily,  o isosorbide MN 30 mg daily . Interventions: o Discussed BP goals and benefits of medication for prevention of heart attack / stroke o Discussed swelling may be a side effect of amlodipine; recommended to monitor for next 3 weeks until PCP appt and see if improves . Patient self care activities - Over the next 365 days, patient will: o Check BP twice daily, document, and provide at future appointments o Ensure daily salt intake < 2300 mg/day o Monitor lower leg swelling   Hyperlipidemia / Coronary artery disease Lab Results  Component Value Date/Time   LDLCALC 55 08/17/2019 07:47 AM   LDLCALC 43 06/27/2018 08:12 AM   LDLDIRECT 88.0 10/19/2016 08:44 AM .  Pharmacist Clinical Goal(s): o Over the next 365 days, patient will work with PharmD and providers to maintain LDL goal < 70 . Current regimen:  o rosuvastatin 10 mg daily,  o clopidogrel 75 mg daily,  o nitroglycerin 0.4 mg PRN  . Interventions: o Discussed cholesterol goals and benefits of medication for prevention of heart attack / stroke . Patient self care activities - Over the next 365 days, patient will: o Continue medication as prescribed o Continue low cholesterol diet  Medication  management . Pharmacist Clinical Goal(s): o Over the next 365 days, patient will work with PharmD and providers to achieve optimal medication adherence . Current pharmacy: [facility] . Interventions o Comprehensive medication review performed. o Utilize [facility] pharmacy for medication synchronization, packaging and delivery . Patient self care activities - Over the next 365 days, patient will: o Focus on medication adherence by pill pack o Take medications as prescribed o Report any questions or concerns to PharmD and/or provider(s)  Please see past updates related to this goal by clicking on the "Past Updates" button in the selected goal       Patient verbalizes understanding of instructions provided today.   Telephone follow up appointment with pharmacy team member scheduled for: 8 months  Charlene Brooke, PharmD Clinical Pharmacist [facility] After Age 63 After age 75, you are at a higher risk for certain long-term diseases and infections as well as injuries from falls. Falls are a major cause of broken bones and head injuries in people who are older than age 44. Getting regular preventive care can help to keep you healthy and well. Preventive care includes getting regular testing and making lifestyle changes as recommended by your health care provider. Talk with your health care provider about:  Which screenings and tests you should have. A screening is a test that checks for a disease when you have no symptoms.  A diet and exercise plan that is right for you. What  should I know about screenings and tests to prevent falls? Screening and testing are the best ways to find a health problem early. Early diagnosis and treatment give you the best chance of managing medical conditions that are common after age 35. Certain conditions and lifestyle choices may make you more likely to have a fall. Your health care provider may  recommend:  Regular vision checks. Poor vision and conditions such as cataracts can make you more likely to have a fall. If you wear glasses, make sure to get your prescription updated if your vision changes.  Medicine review. Work with your health care provider to regularly review all of the medicines you are taking, including over-the-counter medicines. Ask your health care provider about any side effects that may make you more likely to have a fall. Tell your health care provider if any medicines that you take make you feel dizzy or sleepy.  Osteoporosis screening. Osteoporosis is a condition that causes the bones to get weaker. This can make the bones weak and cause them to break more easily.  Blood pressure screening. Blood pressure changes and medicines to control blood pressure can make you feel dizzy.  Strength and balance checks. Your health care provider may recommend certain tests to check your strength and balance while standing, walking, or changing positions.  Foot health exam. Foot pain and numbness, as well as not wearing proper footwear, can make you more likely to have a fall.  Depression screening. You may be more likely to have a fall if you have a fear of falling, feel emotionally low, or feel unable to do activities that you used to do.  Alcohol use screening. Using too much alcohol can affect your balance and may make you more likely to have a fall. What actions can I take to lower my risk of falls? General instructions  Talk with your health care provider about your risks for falling. Tell your health care provider if: ? You fall. Be sure to tell your health care provider about all falls, even ones that seem minor. ? You feel dizzy, sleepy, or off-balance.  Take over-the-counter and prescription medicines only as told by your health care provider. These include any supplements.  Eat a healthy diet and maintain a healthy weight. A healthy diet includes low-fat dairy  products, low-fat (lean) meats, and fiber from whole grains, beans, and lots of fruits and vegetables. Home safety  Remove any tripping hazards, such as rugs, cords, and clutter.  Install safety equipment such as grab bars in bathrooms and safety rails on stairs.  Keep rooms and walkways well-lit. Activity   Follow a regular exercise program to stay fit. This will help you maintain your balance. Ask your health care provider what types of exercise are appropriate for you.  If you need a cane or walker, use it as recommended by your health care provider.  Wear supportive shoes that have nonskid soles. Lifestyle  Do not drink alcohol if your health care provider tells you not to drink.  If you drink alcohol, limit how much you have: ? 0-1 drink a day for women. ? 0-2 drinks a day for men.  Be aware of how much alcohol is in your drink. In the [place] one drink equals one typical bottle of beer (12 oz), one-half glass of wine (5 oz), or one shot of hard liquor (1 oz).  Do not use any products that contain nicotine or tobacco, such as cigarettes and e-cigarettes. If you need  help quitting, ask your health care provider. Summary  Having a healthy lifestyle and getting preventive care can help to protect your health and wellness after age 102.  Screening and testing are the best way to find a health problem early and help you avoid having a fall. Early diagnosis and treatment give you the best chance for managing medical conditions that are more common for people who are older than age 98.  Falls are a major cause of broken bones and head injuries in people who are older than age 63. Take precautions to prevent a fall at home.  Work with your health care provider to learn what changes you can make to improve your health and wellness and to prevent falls. This information is not intended to replace advice given to you by your health care provider. Make sure you discuss any questions you  have with your health care provider. Document Revised: 03/08/2019 Document Reviewed: 09/28/2017 Elsevier Patient Education  2020 [employer].

## 2020-07-09 ENCOUNTER — Other Ambulatory Visit: Payer: Self-pay | Admitting: Internal Medicine

## 2020-07-09 ENCOUNTER — Telehealth: Payer: Self-pay

## 2020-07-09 NOTE — Progress Notes (Signed)
I also received call from patient regarding BP issue described below. Patient reports she has not taken her BP medications this morning, and after drinking water and eating her BP has returned to normal at 127/81. She also reports she did not experience dizziness, lightheadedness, or any symptoms while her BP was low last night.   I advised patient to drink plenty of water today, and to check blood pressure again before bedtime today, and resume normal medication schedule as long as BP is above 115/65 or so. I will call patient back tomorrow to see how she does.  May consider holding amlodipine if low BP issues persist.

## 2020-07-09 NOTE — Telephone Encounter (Signed)
New message   1. What are your BP readings? b/p last night  98/56 -- 89/56 --128/63  no morning meds taken b/p while on the phone  127/81   2. Are you having any other symptoms (ex. Dizziness, headache, blurred vision, passed out)? No   3. What is your BP issue? Wanted to let the MD know - appt was offer to the patient decline

## 2020-07-09 NOTE — Telephone Encounter (Signed)
[name] with [facility] called and is wanting to let us know of the two previous messages with the same readings. She told the patient that she would follow up.    [name]  [phone]

## 2020-07-10 NOTE — Telephone Encounter (Signed)
[name] with [facility] following up on previous messages. States that the patient holding her medications until she hears back. Please advise.  CB#: [phone]

## 2020-07-10 NOTE — Telephone Encounter (Signed)
Contacted pt about her Low BP readings.   143/86 was the BP reading that patient got this morning. Pt started back on all of her BP meds yesterday morning.   130/72 was the reading when the pt checked while I was on the phone with her. Pt checked the same arm several more times and the numbers were getting lower and lower.  Pt will continue the current rx regimen and she will make an appointment to see PCP if her BP starts dropping again.   Pt is calling her insurance to see if she can qualify for a new BP cuff.

## 2020-07-16 ENCOUNTER — Telehealth: Payer: Self-pay | Admitting: Internal Medicine

## 2020-07-16 NOTE — Telephone Encounter (Signed)
Hi Dr. Hilarie Fredrickson, this pt just r/s her colonoscopy that was scheduled on 8/23 with you because her daughter, who is her care partner, will be out of town for work that day. She r/s to 9/7 at 8:00am. Thank you.

## 2020-07-18 ENCOUNTER — Telehealth: Payer: Self-pay | Admitting: Pharmacist

## 2020-07-18 NOTE — Progress Notes (Signed)
  Chronic Care Management Pharmacy Assistant   Name: [name]  MRN: [id number] DOB: 10/25/1951  Reason for Encounter: Medication Review  Patient Questions:  1.  Have you seen any other providers since your last visit? No  2.  Any changes in your medicines or health? No    PCP : Jones, Thomas L, MD  Allergies:   Allergies  Allergen Reactions  . Aspirin Other (See Comments)    unknown  . Gadolinium Hives    patient unsure of which kind of dye she had a reaction to until reaction to Magnevist - may be allergic to x-ray contrast, Onset Date: 04272008   . Iohexol Hives    patient unsure of which kind of dye she had a reaction to until reaction to Magnevist - may be allergic to x-ray contrast, Onset Date: 04272008  . Livalo [Pitavastatin] Other (See Comments)    myalgias  . Hydrocodone-Acetaminophen Other (See Comments)  . Lipitor [Atorvastatin] Other (See Comments)    Myalgias    Medications: Outpatient Encounter Medications as of 07/18/2020  Medication Sig  . amLODipine (NORVASC) 5 MG tablet Take 1 tablet (5 mg total) by mouth daily.  . carvedilol (COREG) 6.25 MG tablet Take 1 tablet (6.25 mg total) by mouth 2 (two) times daily with a meal.  . clopidogrel (PLAVIX) 75 MG tablet Take 1 tablet (75 mg total) by mouth daily with breakfast.  . famotidine (PEPCID) 40 MG tablet Take 1 tablet (40 mg total) by mouth daily.  . fluticasone (FLONASE) 50 MCG/ACT nasal spray Place 1 spray into both nostrils daily.  . guaiFENesin (MUCINEX) 600 MG 12 hr tablet Take 2 tablets (1,200 mg total) by mouth 2 (two) times daily as needed.  . isosorbide mononitrate (IMDUR) 30 MG 24 hr tablet Take 1 tablet (30 mg total) by mouth daily.  . levocetirizine (XYZAL) 5 MG tablet Take 1 tablet (5 mg total) by mouth every evening.  . levothyroxine (SYNTHROID) 88 MCG tablet TAKE 1 TABLET BY MOUTH EVERY DAY  . linaclotide (LINZESS) 145 MCG CAPS capsule Take 1 capsule (145 mcg total) by mouth daily before  breakfast. (Patient taking differently: Take 145 mcg by mouth daily as needed. )  . losartan (COZAAR) 25 MG tablet Take 1 tablet (25 mg total) by mouth daily.  . meclizine (ANTIVERT) 25 MG tablet Take 1 tablet (25 mg total) by mouth 2 (two) times daily as needed for dizziness.  . Na Sulfate-K Sulfate-Mg Sulf (SUPREP BOWEL PREP KIT PO) Take 1 kit by mouth once. Colonoscopy bowel prep  ( already had kit from prev. Cancelled colon)  . nitroGLYCERIN (NITROSTAT) 0.4 MG SL tablet Place 1 tablet (0.4 mg total) under the tongue every 5 (five) minutes as needed for chest pain.  . pantoprazole (PROTONIX) 20 MG tablet Take 1 tablet (20 mg total) by mouth daily.  . potassium chloride SA (KLOR-CON) 20 MEQ tablet Take 1 tablet (20 mEq total) by mouth 2 (two) times daily.  . rosuvastatin (CRESTOR) 10 MG tablet Take 1 tablet (10 mg total) by mouth daily.  . Vitamin D, Ergocalciferol, (DRISDOL) 1.25 MG (50000 UNIT) CAPS capsule TAKE ONE CAPSULE BY MOUTH ONCE A WEEK ON SATURDAY  . [DISCONTINUED] levothyroxine (SYNTHROID) 88 MCG tablet Take 1 tablet (88 mcg total) by mouth daily.   No facility-administered encounter medications on file as of 07/18/2020.    Current Diagnosis: Patient Active Problem List   Diagnosis Date Noted  . Need for Tdap vaccination 07/01/2020  . Hypokalemia   due to inadequate potassium intake 07/01/2020  . Hirsutism 05/21/2020  . Balding 05/21/2020  . Allergic rhinitis 04/12/2020  . Takotsubo cardiomyopathy 02/26/2019  . NASH (nonalcoholic steatohepatitis) 01/22/2019  . Chronic idiopathic constipation 01/12/2019  . Arthritis of carpometacarpal [facility]) joint of left thumb 09/25/2018  . Degenerative arthritis of left knee 05/24/2018  . CAD (coronary artery disease) 05/16/2018  . Vitamin D deficiency 11/11/2017  . Degenerative disc disease, lumbar 08/29/2017  . Prediabetes 06/14/2016  . Obesity (BMI 30.0-34.9) 06/14/2016  . Routine general medical examination at a health care facility  12/31/2014  . Osteopenia 07/20/2013  . GERD (gastroesophageal reflux disease) 09/16/2011  . Generalized anxiety disorder 09/16/2011  . Constipation, slow transit 09/16/2011  . Personal history of colonic polyps 08/17/2011  . DJD (degenerative joint disease) of knee 06/25/2011  . Pure hyperglyceridemia 06/24/2011  . Hyperlipidemia LDL goal <70 07/30/2009  . Hypothyroidism 04/25/2009  . Essential hypertension 04/25/2009  . FIBROMYALGIA 04/25/2009    Goals Addressed   None     Follow-Up:  Coordination of [facility] Pharmacy Services   Called and spoke with the patient to verify to she if she doe snot need any refills the patient received a delivery on  6/282021 for 90 day supply what was included in the delivery Potassium Cl ER 545mq  Clopidogrel 736m Levocetirizine 45m62mMagnesium Oxide 400m48marvedilol 6.245mg645modipine 45mg  53msorbide Mononitrate 30mg L44mtan Potassium 245mg  R66mastatin 10mg  Le3myroxine 88mcg  Pa145mrazole 20mg  Famo30mne 40mg  Vitam63m2 1250 mcg   The patient stated that she is ok on her medication until 08/26/2020.  Deven CrosbyRosendo GroscaKentfieldAssistant  [phone]

## 2020-07-21 ENCOUNTER — Other Ambulatory Visit: Payer: Self-pay | Admitting: Internal Medicine

## 2020-07-21 ENCOUNTER — Encounter: Payer: Medicare Other | Admitting: Internal Medicine

## 2020-07-21 DIAGNOSIS — Z1231 Encounter for screening mammogram for malignant neoplasm of breast: Secondary | ICD-10-CM

## 2020-08-05 ENCOUNTER — Encounter: Payer: Self-pay | Admitting: Internal Medicine

## 2020-08-05 ENCOUNTER — Other Ambulatory Visit: Payer: Self-pay

## 2020-08-05 ENCOUNTER — Ambulatory Visit (AMBULATORY_SURGERY_CENTER): Payer: Medicare Other | Admitting: Internal Medicine

## 2020-08-05 VITALS — BP 170/86 | HR 62 | Temp 97.6°F | Resp 27 | Ht 67.0 in | Wt 180.0 lb

## 2020-08-05 DIAGNOSIS — D125 Benign neoplasm of sigmoid colon: Secondary | ICD-10-CM

## 2020-08-05 DIAGNOSIS — Z538 Procedure and treatment not carried out for other reasons: Secondary | ICD-10-CM | POA: Diagnosis not present

## 2020-08-05 DIAGNOSIS — Z86010 Personal history of colonic polyps: Secondary | ICD-10-CM | POA: Diagnosis not present

## 2020-08-05 LAB — HM COLONOSCOPY

## 2020-08-05 MED ORDER — SODIUM CHLORIDE 0.9 % IV SOLN: 500.0000 mL | Freq: Once | INTRAVENOUS | Status: DC

## 2020-08-05 NOTE — Op Note (Signed)
[place] Patient Name: [name] Procedure Date: 08/05/2020 7:24 AM MRN: [id number] Endoscopist: Jerene Bears , MD Age: 69 Referring MD:  Date of Birth: October 30, 1951 Gender: Female Account #: [id number] Procedure:                Colonoscopy Indications:              High risk colon cancer surveillance: Personal                            history of adenomatous polyps (3 removed at last                            exam with largest being 15 mm), Last colonoscopy:                            February 2018 Medicines:                Monitored Anesthesia Care Procedure:                Pre-Anesthesia Assessment:                           - Prior to the procedure, a History and Physical                            was performed, and patient medications and                            allergies were reviewed. The patient's tolerance of                            previous anesthesia was also reviewed. The risks                            and benefits of the procedure and the sedation                            options and risks were discussed with the patient.                            All questions were answered, and informed consent                            was obtained. Prior Anticoagulants: The patient has                            taken Plavix (clopidogrel), last dose was 5 days                            prior to procedure. ASA Grade Assessment: III - A                            patient with severe systemic disease. After  reviewing the risks and benefits, the patient was                            deemed in satisfactory condition to undergo the                            procedure.                           After obtaining informed consent, the colonoscope                            was passed under direct vision. Throughout the                            procedure, the patient's blood pressure, pulse, and                            oxygen  saturations were monitored continuously. The                            Colonoscope was introduced through the anus and                            advanced to the transverse colon. The colonoscopy                            was technically difficult and complex due to a                            redundant colon and significant looping. The scope                            was not able to be advanced past the mid-transverse                            colon despite counter pressure, changes to patient                            position (supine), water immersion, change from                            pediatric to adult colonoscope).The patient                            tolerated the procedure well. The quality of the                            bowel preparation was good. The rectum was                            photographed. Scope In: 8:12:34 AM Scope Out: 8:46:22 AM Total Procedure Duration: 0 hours 33 minutes 48 seconds  Findings:  The digital rectal exam was normal.                           The left colon was significantly redundant with one                            segment which has a sharp angulation.                           A 7-8 mm polyp was seen in the more distant                            transverse colon but could not be reached despite                            the measures above. The polyp was sessile.                            Polypectomy was not attempted due to an incomplete                            examination/inability to reach the polyp.                           A 5 mm polyp was found in the sigmoid colon. The                            polyp was sessile. The polyp was removed with a                            cold snare. Resection and retrieval were complete.                           A few small-mouthed diverticula were found in the                            sigmoid colon.                           Internal hemorrhoids were found during  retroflexion. Complications:            No immediate complications. Estimated Blood Loss:     Estimated blood loss was minimal. Impression:               - Redundant colon. Incomplete examination.                           - One 7-8 mm polyp in the transverse colon.                            Resection not attempted as above.                           - One 5 mm polyp in the sigmoid colon, removed with  a cold snare. Resected and retrieved.                           - Diverticulosis in the sigmoid colon.                           - Internal hemorrhoids. Recommendation:           - Patient has a contact number available for                            emergencies. The signs and symptoms of potential                            delayed complications were discussed with the                            patient. Return to normal activities tomorrow.                            Written discharge instructions were provided to the                            patient.                           - Resume previous diet.                           - Continue present medications.                           - Resume Plavix (clopidogrel) at prior dose                            tomorrow. Refer to managing physician for further                            adjustment of therapy.                           - Await pathology results.                           - Repeat colonoscopy because the examination was                            incomplete and presence of known polyp not resected                            today. Repeat colonoscopy can be considered with                            another colleague locally versus referral to [facility]                            (I will  discuss with the patient). Jerene Bears, MD 08/05/2020 8:56:48 AM This report has been signed electronically.

## 2020-08-05 NOTE — Progress Notes (Signed)
History reviewed today  VS CW

## 2020-08-05 NOTE — Progress Notes (Signed)
Called to room to assist during endoscopic procedure.  Patient ID and intended procedure confirmed with present staff. Received instructions for my participation in the procedure from the performing physician.  

## 2020-08-05 NOTE — Progress Notes (Signed)
To PACU, VSS. Report to [email or web address] 

## 2020-08-05 NOTE — Patient Instructions (Signed)
Thank you for allowing [place] to care for you today!  Resume previous diet and medications today.  Resume Plavix at prior dose tomorrow, 08/06/2020.  We will contact you about plans for another colonoscopy, local versus [place] your normal activities tomorrow, 08/06/20.       YOU HAD AN ENDOSCOPIC PROCEDURE TODAY AT [facility]:   Refer to the procedure report that was given to you for any specific questions about what was found during the examination.  If the procedure report does not answer your questions, please call your gastroenterologist to clarify.  If you requested that your care partner not be given the details of your procedure findings, then the procedure report has been included in a sealed envelope for you to review at your convenience later.  YOU SHOULD EXPECT: Some feelings of bloating in the abdomen. Passage of more gas than usual.  Walking can help get rid of the air that was put into your GI tract during the procedure and reduce the bloating. If you had a lower endoscopy (such as a colonoscopy or flexible sigmoidoscopy) you may notice spotting of blood in your stool or on the toilet paper. If you underwent a bowel prep for your procedure, you may not have a normal bowel movement for a few days.  Please Note:  You might notice some irritation and congestion in your nose or some drainage.  This is from the oxygen used during your procedure.  There is no need for concern and it should clear up in a day or so.  SYMPTOMS TO REPORT IMMEDIATELY:   Following lower endoscopy (colonoscopy or flexible sigmoidoscopy):  Excessive amounts of blood in the stool  Significant tenderness or worsening of abdominal pains  Swelling of the abdomen that is new, acute  Fever of 100F or higher   For urgent or emergent issues, a gastroenterologist can be reached at any hour by calling [phone]. Do not use MyChart messaging for urgent concerns.    DIET:  We do  recommend a small meal at first, but then you may proceed to your regular diet.  Drink plenty of fluids but you should avoid alcoholic beverages for 24 hours.  ACTIVITY:  You should plan to take it easy for the rest of today and you should NOT DRIVE or use heavy machinery until tomorrow (because of the sedation medicines used during the test).    FOLLOW UP: Our staff will call the number listed on your records 48-72 hours following your procedure to check on you and address any questions or concerns that you may have regarding the information given to you following your procedure. If we do not reach you, we will leave a message.  We will attempt to reach you two times.  During this call, we will ask if you have developed any symptoms of COVID 19. If you develop any symptoms (ie: fever, flu-like symptoms, shortness of breath, cough etc.) before then, please call [phone].  If you test positive for Covid 19 in the 2 weeks post procedure, please call and report this information to [place].    If any biopsies were taken you will be contacted by phone or by letter within the next 1-3 weeks.  Please call us at [phone] if you have not heard about the biopsies in 3 weeks.    SIGNATURES/CONFIDENTIALITY: You and/or your care partner have signed paperwork which will be entered into your electronic medical record.  These signatures attest to  the fact that that the information above on your After Visit Summary has been reviewed and is understood.  Full responsibility of the confidentiality of this discharge information lies with you and/or your care-partner.

## 2020-08-06 ENCOUNTER — Telehealth: Payer: Self-pay

## 2020-08-06 NOTE — Telephone Encounter (Signed)
-----   Message from Jerene Bears, MD sent at 08/06/2020  1:50 PM EDT ----- Chester Holstein Thanks  Zander Ingham, Please schedule patient a colon slot with GM as he directed below. Aiko Belko, you can let the patient know GM is the MD I am recommending.  I discussed this with her and her daughter yesterday so this should not come as a surprise Thanks [name] ----- Message ----- From: Irving Copas., MD Sent: 08/05/2020   4:34 PM EDT To: Timothy Lasso, RN, Jerene Bears, MD  [name],No worries happy to give her intent if she would like [place] here in [facility] or if she wants to be done at [facility].Just let Khaniya Tenaglia know and we can work on scheduling a 1 hour hospital-based procedure time slot with ultraslim colonoscope being available.Taneah Masri, once we get the go ahead just let [name] and I know what the date is for her procedure.Thanks.GM ----- Message ----- From: Jerene Bears, MD Sent: 08/05/2020  10:01 AM EDT To: Irving Copas., MD  Gabe This patient of mine came for colon today and I could not reach her cecum.  Not common, obviously, but despite significant effort I was unsuccessful.  In years past I have sent this people to [place].  I don't think I have had an incomplete colon since you arrived.   Is this something you would like to try in the outpatient hospital setting or should I refer to [facility] as in years past? Thanks  Jay

## 2020-08-07 ENCOUNTER — Telehealth: Payer: Self-pay

## 2020-08-07 NOTE — Telephone Encounter (Signed)
°  Follow up Call-  Call back number 08/05/2020  Post procedure Call Back phone  # [phone]  Permission to leave phone message Yes  Some recent data might be hidden     Patient questions:  Do you have a fever, pain , or abdominal swelling? No. Pain Score  0 *  Have you tolerated food without any problems? Yes.    Have you been able to return to your normal activities? Yes.    Do you have any questions about your discharge instructions: Diet   No. Medications  No. Follow up visit  No.  Do you have questions or concerns about your Care? No.  Actions: * If pain score is 4 or above: No action needed, pain <4. 1. Have you developed a fever since your procedure? no  2.   Have you had an respiratory symptoms (SOB or cough) since your procedure? no  3.   Have you tested positive for COVID 19 since your procedure no  4.   Have you had any family members/close contacts diagnosed with the COVID 19 since your procedure?  no   If yes to any of these questions please route to Joylene John, RN and Joella Prince, RN

## 2020-08-08 ENCOUNTER — Other Ambulatory Visit: Payer: Self-pay

## 2020-08-08 DIAGNOSIS — D509 Iron deficiency anemia, unspecified: Secondary | ICD-10-CM

## 2020-08-08 NOTE — Telephone Encounter (Signed)
[facility] Pre-operative Risk Assessment     Request for surgical clearance:     Endoscopy Procedure  What type of surgery is being performed?     Colon   When is this surgery scheduled?     10/06/20  What type of clearance is required ?   Pharmacy  Are there any medications that need to be held prior to surgery and how long? Plavix   Practice name and name of physician performing surgery?      [facility]  What is your office phone and fax number?      Phone- [phone]  Fax707 [phone]  Anesthesia type (None, local, MAC, general) ?       MAC   The pt has been advised and instructed for colon and is awaiting a call return call regarding plavix.

## 2020-08-12 NOTE — Telephone Encounter (Signed)
Yes - she is at low risk of holding clopidogrel x 5 days. thanks

## 2020-08-12 NOTE — Telephone Encounter (Signed)
The pt has been advised of the ok to hold plavix 5 days prior to the procedure.

## 2020-08-12 NOTE — Telephone Encounter (Signed)
Spoke with Dr. Atilano Median, she denies any recent chest pain or shortness of breath. She is capable of climbing 2 flights of stairs without any issue. She should be cleared for the low risk procedure.   Will await for Dr. Antionette Char final recommendation regarding plavix prior to clearance.

## 2020-08-12 NOTE — Telephone Encounter (Signed)
Left message for the patient to call back and speak with preop APP of the day.   Dr. [name], please review, Mrs. Vanecek had previous cath in 2019 that showed normal coronary artery (other than a 70% lesion in a smal D2 that is <76m), wall motion abnormality suggestive of Takotsubo cardiomyopathy.  EF was 45% the time.  Most recent echocardiogram obtained on 08/17/2019 showed EF 70 to 75%.   As long as Mrs. Tripodi does not have any recent chest pain or worsening shortness of breath, would you be okay with holding Plavix for 5 days prior to the GI procedure.  Please forward your response to P CV DIV PREOP

## 2020-08-14 ENCOUNTER — Other Ambulatory Visit: Payer: Self-pay | Admitting: Internal Medicine

## 2020-08-14 DIAGNOSIS — K219 Gastro-esophageal reflux disease without esophagitis: Secondary | ICD-10-CM

## 2020-08-14 DIAGNOSIS — E039 Hypothyroidism, unspecified: Secondary | ICD-10-CM

## 2020-08-14 DIAGNOSIS — K2100 Gastro-esophageal reflux disease with esophagitis, without bleeding: Secondary | ICD-10-CM

## 2020-08-18 ENCOUNTER — Telehealth: Payer: Self-pay | Admitting: Pharmacist

## 2020-08-18 NOTE — Progress Notes (Addendum)
Chronic Care Management Pharmacy Assistant   Name: [name]  MRN: [id number] DOB: [birth year]  Reason for Encounter: Medication Review  Patient Questions:  1.  Have you seen any other providers since your last visit? No  2.  Any changes in your medicines or health? No   PCP : [name], MD  Allergies:   Allergies  Allergen Reactions  . Aspirin Other (See Comments)    unknown  . Gadolinium Hives    patient unsure of which kind of dye she had a reaction to until reaction to Magnevist - may be allergic to x-ray contrast, Onset Date: [id number]   . Iohexol Hives    patient unsure of which kind of dye she had a reaction to until reaction to Magnevist - may be allergic to x-ray contrast, Onset Date: [id number]  . Livalo [Pitavastatin] Other (See Comments)    myalgias  . Hydrocodone-Acetaminophen Other (See Comments)  . Lipitor [Atorvastatin] Other (See Comments)    Myalgias    Medications: Outpatient Encounter Medications as of 08/18/2020  Medication Sig  . amLODipine (NORVASC) 5 MG tablet Take 1 tablet (5 mg total) by mouth daily.  . carvedilol (COREG) 6.25 MG tablet Take 1 tablet (6.25 mg total) by mouth 2 (two) times daily with a meal.  . clopidogrel (PLAVIX) 75 MG tablet Take 1 tablet (75 mg total) by mouth daily with breakfast.  . famotidine (PEPCID) 40 MG tablet TAKE ONE TABLET BY MOUTH EVERY MORNING  . fluticasone (FLONASE) 50 MCG/ACT nasal spray Place 1 spray into both nostrils daily.  Marland Kitchen guaiFENesin (MUCINEX) 600 MG 12 hr tablet Take 2 tablets (1,200 mg total) by mouth 2 (two) times daily as needed.  . isosorbide mononitrate (IMDUR) 30 MG 24 hr tablet Take 1 tablet (30 mg total) by mouth daily.  Marland Kitchen levocetirizine (XYZAL) 5 MG tablet Take 1 tablet (5 mg total) by mouth every evening.  Marland Kitchen levothyroxine (SYNTHROID) 88 MCG tablet TAKE ONE TABLET BY MOUTH EVERY MORNING  . linaclotide (LINZESS) 145 MCG CAPS capsule Take 1 capsule (145 mcg total) by mouth daily before  breakfast. (Patient taking differently: Take 145 mcg by mouth daily as needed. )  . losartan (COZAAR) 25 MG tablet Take 1 tablet (25 mg total) by mouth daily.  . meclizine (ANTIVERT) 25 MG tablet Take 1 tablet (25 mg total) by mouth 2 (two) times daily as needed for dizziness. (Patient not taking: Reported on 08/05/2020)  . Na Sulfate-K Sulfate-Mg Sulf (SUPREP BOWEL PREP KIT PO) Take 1 kit by mouth once. Colonoscopy bowel prep  ( already had kit from prev. Cancelled colon) (Patient not taking: Reported on 08/05/2020)  . nitroGLYCERIN (NITROSTAT) 0.4 MG SL tablet Place 1 tablet (0.4 mg total) under the tongue every 5 (five) minutes as needed for chest pain. (Patient not taking: Reported on 08/05/2020)  . pantoprazole (PROTONIX) 20 MG tablet TAKE ONE TABLET BY MOUTH EVERY MORNING  . potassium chloride SA (KLOR-CON) 20 MEQ tablet Take 1 tablet (20 mEq total) by mouth 2 (two) times daily.  . rosuvastatin (CRESTOR) 10 MG tablet Take 1 tablet (10 mg total) by mouth daily.  . Vitamin D, Ergocalciferol, (DRISDOL) 1.25 MG (50000 UNIT) CAPS capsule TAKE ONE CAPSULE BY MOUTH ONCE A WEEK ON SATURDAY  . [DISCONTINUED] levothyroxine (SYNTHROID) 88 MCG tablet Take 1 tablet (88 mcg total) by mouth daily.   No facility-administered encounter medications on file as of 08/18/2020.    Current Diagnosis: Patient Active Problem List  Diagnosis Date Noted  . Need for Tdap vaccination 07/01/2020  . Hypokalemia due to inadequate potassium intake 07/01/2020  . Hirsutism 05/21/2020  . Balding 05/21/2020  . Allergic rhinitis 04/12/2020  . Takotsubo cardiomyopathy 02/26/2019  . NASH (nonalcoholic steatohepatitis) 01/22/2019  . Chronic idiopathic constipation 01/12/2019  . Arthritis of carpometacarpal [facility]) joint of left thumb 09/25/2018  . Degenerative arthritis of left knee 05/24/2018  . CAD (coronary artery disease) 05/16/2018  . Vitamin D deficiency 11/11/2017  . Degenerative disc disease, lumbar 08/29/2017  .  Prediabetes 06/14/2016  . Obesity (BMI 30.0-34.9) 06/14/2016  . Routine general medical examination at a health care facility 12/31/2014  . Osteopenia 07/20/2013  . GERD (gastroesophageal reflux disease) 09/16/2011  . Generalized anxiety disorder 09/16/2011  . Constipation, slow transit 09/16/2011  . Personal history of colonic polyps 08/17/2011  . DJD (degenerative joint disease) of knee 06/25/2011  . Pure hyperglyceridemia 06/24/2011  . Hyperlipidemia LDL goal <70 07/30/2009  . Hypothyroidism 04/25/2009  . Essential hypertension 04/25/2009  . FIBROMYALGIA 04/25/2009    Goals Addressed   None     Follow-Up:  Coordination of Enhanced Pharmacy Services    Reviewed chart for medication changes ahead of medication coordination call.  No OVs, Consults, or hospital visits since last care coordination call/Pharmacist visit. No medication changes indicated.  BP Readings from Last 3 Encounters:  08/05/20 (!) 170/86  06/30/20 (!) 130/82  05/21/20 138/76    Lab Results  Component Value Date   HGBA1C 5.9 (H) 06/30/2020     Patient obtains medications through Adherence Packaging  90 Days   Last adherence delivery included:    Potassium Cl ER 82mw twice daily- Breakfast, Dinner  Clopidogrel 751mdaily- Breakfast  Levocetirizine 63m17maily- Bedtime Levothyroxine 78m44maily- Breakfast  Famotidine 40mg33mly- Breakfast  Pantoprazole 20mg 463my- Breakfast  Magnesium Oxide 400mg d39m- Breakfast  amlodipine 63mg dai9m Breakfast  Isosorbide Mononitrate Er 3mg dail77mBreakfast  Losartan Potassium 263mg dail6mreakfast  Rosuvastatin 10mg daily10makfast  Vitamin D2 1250 mcg (50,000 units) weekly Saturday -Breakfast    Patient is due for next adherence delivery on: 08/22/2020. Called patient and reviewed medications and coordinated delivery.  This delivery to include:   Potassium Cl ER 20mew twice97mly- Breakfast, Dinner  Clopidogrel 763mg daily- 48mkfast  Levocetirizine  63mg daily- Be63mme Levothyroxine 78mcg daily- B17mfast  Famotidine 40mg daily- Bre44mst  Pantoprazole 20mg daily- Brea27mt  Magnesium Oxide 400mg daily- Break41m  amlodipine 63mg daily- Breakfa10m Carvedilol 6.25 mg BID - Breakfast, Bedtime Isosorbide Mononitrate Er 3mg daily- Breakfas64mLosartan Potassium 263mg daily- Breakfas46mosuvastatin 10mg daily-Breakfast 38mamin D2 1250 mcg (50,000 units) weekly Saturday -Breakfast    Patient declined the following medications Finasteride 1mg, Fluticasone Propi33mte 50mcg, Nitro 0.4mg due 11medication be68m PRN she has enough to last her until next fill date.   The patient needs a refill on Vitamin D 50,000units prescribed by Dr. Jones a message has been Ronnald Rampto CPP [place] requesting a [place].   Confirmed delivery date of 08/22/2020., advised patient that pharmacy will contact them the morning of delivery.    Deven Crosby, CMA  [facility] [phone]

## 2020-09-01 ENCOUNTER — Ambulatory Visit
Admission: RE | Admit: 2020-09-01 | Discharge: 2020-09-01 | Disposition: A | Discharge status: Home / Self Care | Payer: Medicare Other | Source: Ambulatory Visit | Attending: Internal Medicine | Admitting: Internal Medicine

## 2020-09-01 ENCOUNTER — Other Ambulatory Visit: Payer: Self-pay

## 2020-09-01 DIAGNOSIS — Z1231 Encounter for screening mammogram for malignant neoplasm of breast: Secondary | ICD-10-CM

## 2020-09-02 LAB — HM MAMMOGRAPHY

## 2020-09-09 DIAGNOSIS — Z1152 Encounter for screening for COVID-19: Secondary | ICD-10-CM | POA: Diagnosis not present

## 2020-09-09 DIAGNOSIS — Z1159 Encounter for screening for other viral diseases: Secondary | ICD-10-CM | POA: Diagnosis not present

## 2020-09-10 ENCOUNTER — Telehealth: Payer: Self-pay | Admitting: Gastroenterology

## 2020-09-10 ENCOUNTER — Telehealth: Payer: Self-pay | Admitting: Internal Medicine

## 2020-09-10 NOTE — Telephone Encounter (Signed)
Patient called needs to discuss the covid testing site location is wondering if she can go elsewhere that is closer

## 2020-09-10 NOTE — Telephone Encounter (Signed)
  Patient requesting referral to ENT (no preference) Patient states she is still having trouble with her ears since last ov

## 2020-09-11 ENCOUNTER — Other Ambulatory Visit: Payer: Self-pay | Admitting: Internal Medicine

## 2020-09-11 DIAGNOSIS — H6982 Other specified disorders of Eustachian tube, left ear: Secondary | ICD-10-CM

## 2020-09-11 NOTE — Telephone Encounter (Signed)
Patient does not know how to get to the [facility] address.  I mailed her [place] directions from her home to the testing center.  She will call back if she has any additional questions or concerns.  We discussed that there is not an alternative testing site.

## 2020-09-17 ENCOUNTER — Telehealth: Payer: Self-pay | Admitting: Pharmacist

## 2020-09-17 NOTE — Progress Notes (Addendum)
Chronic Care Management Pharmacy Assistant   Name: [name]  MRN: [id number] DOB: 1951/03/10  Reason for Encounter: Medication Review  PCP : [name], MD  Allergies:   Allergies  Allergen Reactions  . Aspirin Other (See Comments)    unknown  . Gadolinium Hives    patient unsure of which kind of dye she had a reaction to until reaction to Magnevist - may be allergic to x-ray contrast, Onset Date: [id number]   . Iohexol Hives    patient unsure of which kind of dye she had a reaction to until reaction to Magnevist - may be allergic to x-ray contrast, Onset Date: [id number]  . Livalo [Pitavastatin] Other (See Comments)    myalgias  . Hydrocodone-Acetaminophen Other (See Comments)  . Lipitor [Atorvastatin] Other (See Comments)    Myalgias    Medications: Outpatient Encounter Medications as of 09/17/2020  Medication Sig  . amLODipine (NORVASC) 5 MG tablet Take 1 tablet (5 mg total) by mouth daily.  . carvedilol (COREG) 6.25 MG tablet Take 1 tablet (6.25 mg total) by mouth 2 (two) times daily with a meal.  . clopidogrel (PLAVIX) 75 MG tablet Take 1 tablet (75 mg total) by mouth daily with breakfast.  . famotidine (PEPCID) 40 MG tablet TAKE ONE TABLET BY MOUTH EVERY MORNING  . fluticasone (FLONASE) 50 MCG/ACT nasal spray Place 1 spray into both nostrils daily.  Marland Kitchen guaiFENesin (MUCINEX) 600 MG 12 hr tablet Take 2 tablets (1,200 mg total) by mouth 2 (two) times daily as needed.  . isosorbide mononitrate (IMDUR) 30 MG 24 hr tablet Take 1 tablet (30 mg total) by mouth daily.  Marland Kitchen levocetirizine (XYZAL) 5 MG tablet Take 1 tablet (5 mg total) by mouth every evening.  Marland Kitchen levothyroxine (SYNTHROID) 88 MCG tablet TAKE ONE TABLET BY MOUTH EVERY MORNING  . linaclotide (LINZESS) 145 MCG CAPS capsule Take 1 capsule (145 mcg total) by mouth daily before breakfast. (Patient taking differently: Take 145 mcg by mouth daily as needed. )  . losartan (COZAAR) 25 MG tablet Take 1 tablet (25 mg total)  by mouth daily.  . meclizine (ANTIVERT) 25 MG tablet Take 1 tablet (25 mg total) by mouth 2 (two) times daily as needed for dizziness. (Patient not taking: Reported on 08/05/2020)  . Na Sulfate-K Sulfate-Mg Sulf (SUPREP BOWEL PREP KIT PO) Take 1 kit by mouth once. Colonoscopy bowel prep  ( already had kit from prev. Cancelled colon) (Patient not taking: Reported on 08/05/2020)  . nitroGLYCERIN (NITROSTAT) 0.4 MG SL tablet Place 1 tablet (0.4 mg total) under the tongue every 5 (five) minutes as needed for chest pain. (Patient not taking: Reported on 08/05/2020)  . pantoprazole (PROTONIX) 20 MG tablet TAKE ONE TABLET BY MOUTH EVERY MORNING  . potassium chloride SA (KLOR-CON) 20 MEQ tablet Take 1 tablet (20 mEq total) by mouth 2 (two) times daily.  . rosuvastatin (CRESTOR) 10 MG tablet Take 1 tablet (10 mg total) by mouth daily.  . Vitamin D, Ergocalciferol, (DRISDOL) 1.25 MG (50000 UNIT) CAPS capsule TAKE ONE CAPSULE BY MOUTH ONCE A WEEK ON SATURDAY  . [DISCONTINUED] levothyroxine (SYNTHROID) 88 MCG tablet Take 1 tablet (88 mcg total) by mouth daily.   No facility-administered encounter medications on file as of 09/17/2020.    Current Diagnosis: Patient Active Problem List   Diagnosis Date Noted  . Hypokalemia due to inadequate potassium intake 07/01/2020  . Hirsutism 05/21/2020  . Balding 05/21/2020  . Allergic rhinitis 04/12/2020  . Takotsubo cardiomyopathy  02/26/2019  . NASH (nonalcoholic steatohepatitis) 01/22/2019  . Chronic idiopathic constipation 01/12/2019  . Arthritis of carpometacarpal [facility]) joint of left thumb 09/25/2018  . Degenerative arthritis of left knee 05/24/2018  . CAD (coronary artery disease) 05/16/2018  . Vitamin D deficiency 11/11/2017  . Degenerative disc disease, lumbar 08/29/2017  . Prediabetes 06/14/2016  . Obesity (BMI 30.0-34.9) 06/14/2016  . Routine general medical examination at a health care facility 12/31/2014  . Osteopenia 07/20/2013  . GERD (gastroesophageal  reflux disease) 09/16/2011  . Generalized anxiety disorder 09/16/2011  . Constipation, slow transit 09/16/2011  . Personal history of colonic polyps 08/17/2011  . DJD (degenerative joint disease) of knee 06/25/2011  . Pure hyperglyceridemia 06/24/2011  . Hyperlipidemia LDL goal <70 07/30/2009  . Hypothyroidism 04/25/2009  . Essential hypertension 04/25/2009  . FIBROMYALGIA 04/25/2009    Goals Addressed   None     Follow-Up:  Coordination of Enhanced Pharmacy Services    Reviewed chart for medication changes ahead of medication coordination call.  No OVs, Consults, or hospital visits since last care coordination call/Pharmacist visit. No medication changes indicated   BP Readings from Last 3 Encounters:  08/05/20 (!) 170/86  06/30/20 (!) 130/82  05/21/20 138/76    Lab Results  Component Value Date   HGBA1C 5.9 (H) 06/30/2020     Patient obtains medications through Adherence Packaging  90 Days   Last adherence delivery included:   Magnesium Oxide 443m  Carvedilol  6.242mAmlodipine 32m34maily Isosrbide Monoitrate Er 70m80mily Losartan Potassium 232mg9msuvastatin 10mg 38mpidogrel 732mg  39mthyroxine 88mcg  54mtidine 40mg Pan44mazole 20mg  Vit66m D2 1250 mcg 50,000 units  Potassium Cl ER Meq  Levocetirizine 32mg   Pati57m is due for next adherence delivery on: 11/17/2020 Called patient and reviewed medications and coordinated delivery.  Patient declined the following medications   Magnesium Oxide 400mg  Carve27ml  6.232mg Amlodip26m32mg daily Iso532mide Monoitrate Er 70mg daily Los16mn Potassium 232mg  Rosuvasta31m10mg  Clopidogre332mmg  Levothyroxi103m8mcg  Famotidine 33m Pantoprazole 2132m Vitamin D2 12557mg 50,000 units  Potassium Cl ER Meq  Levocetirizine 32mg   The patient rec2med a 90-day supply on 08/19/2020 supply ends on 11/20/2020   Patient needs will need refills prior to next delivery: Vitamin D2 1250 mcg 50,000 units ,Potassium  Cl ER Meq  Levocetirizine 32mg    Deven Crosby, N24mA  [facility] Pharmac[facility] Assistant) [phone]

## 2020-10-02 ENCOUNTER — Other Ambulatory Visit (HOSPITAL_COMMUNITY)
Admission: RE | Admit: 2020-10-02 | Discharge: 2020-10-02 | Disposition: A | Discharge status: Home / Self Care | Payer: Medicare Other | Source: Ambulatory Visit | Attending: Gastroenterology | Admitting: Gastroenterology

## 2020-10-02 DIAGNOSIS — Z01812 Encounter for preprocedural laboratory examination: Secondary | ICD-10-CM | POA: Insufficient documentation

## 2020-10-02 DIAGNOSIS — Z20822 Contact with and (suspected) exposure to covid-19: Secondary | ICD-10-CM | POA: Diagnosis not present

## 2020-10-02 LAB — SARS CORONAVIRUS 2 (TAT 6-24 HRS): SARS Coronavirus 2: NEGATIVE

## 2020-10-03 ENCOUNTER — Encounter (HOSPITAL_COMMUNITY): Payer: Self-pay | Admitting: Gastroenterology

## 2020-10-03 NOTE — Progress Notes (Signed)
Spoke with pt for pre-op call. Pt has hx of a MI and is on Plavix. Instructed to hold Plavix 5 days prior to procedure by Dr. [name]. Last dose was 09/30/20. Pt states she is not diabetic.  Covid test done 10/02/20 and it's negative. Pt states she has been in quarantine since the test was done and will stay in quarantine until she comes to the hospital on Monday.  EKG - 12/03/19 CXR - 12/02/19 Echo - 08/17/19 Cath - 05/08/18

## 2020-10-06 ENCOUNTER — Ambulatory Visit (HOSPITAL_COMMUNITY): Payer: Medicare Other | Admitting: Certified Registered"

## 2020-10-06 ENCOUNTER — Encounter (HOSPITAL_COMMUNITY)
Admission: RE | Disposition: A | Discharge status: Home / Self Care | Payer: Self-pay | Source: Home / Self Care | Attending: Gastroenterology

## 2020-10-06 ENCOUNTER — Ambulatory Visit (HOSPITAL_COMMUNITY)
Admission: RE | Admit: 2020-10-06 | Discharge: 2020-10-06 | Disposition: A | Discharge status: Home / Self Care | Payer: Medicare Other | Attending: Gastroenterology | Admitting: Gastroenterology

## 2020-10-06 ENCOUNTER — Encounter (HOSPITAL_COMMUNITY): Payer: Self-pay | Admitting: Gastroenterology

## 2020-10-06 ENCOUNTER — Other Ambulatory Visit: Payer: Self-pay

## 2020-10-06 DIAGNOSIS — K642 Third degree hemorrhoids: Secondary | ICD-10-CM | POA: Diagnosis not present

## 2020-10-06 DIAGNOSIS — Z09 Encounter for follow-up examination after completed treatment for conditions other than malignant neoplasm: Secondary | ICD-10-CM | POA: Diagnosis present

## 2020-10-06 DIAGNOSIS — Z86010 Personal history of colonic polyps: Secondary | ICD-10-CM | POA: Insufficient documentation

## 2020-10-06 DIAGNOSIS — K56699 Other intestinal obstruction unspecified as to partial versus complete obstruction: Secondary | ICD-10-CM | POA: Diagnosis not present

## 2020-10-06 DIAGNOSIS — K649 Unspecified hemorrhoids: Secondary | ICD-10-CM | POA: Diagnosis not present

## 2020-10-06 DIAGNOSIS — D126 Benign neoplasm of colon, unspecified: Secondary | ICD-10-CM | POA: Diagnosis not present

## 2020-10-06 DIAGNOSIS — D509 Iron deficiency anemia, unspecified: Secondary | ICD-10-CM

## 2020-10-06 DIAGNOSIS — K635 Polyp of colon: Secondary | ICD-10-CM | POA: Diagnosis not present

## 2020-10-06 HISTORY — PX: POLYPECTOMY: SHX5525

## 2020-10-06 HISTORY — DX: Pneumonia, unspecified organism: J18.9

## 2020-10-06 HISTORY — PX: COLONOSCOPY WITH PROPOFOL: SHX5780

## 2020-10-06 HISTORY — DX: Acute myocardial infarction, unspecified: I21.9

## 2020-10-06 SURGERY — COLONOSCOPY WITH PROPOFOL
Anesthesia: Monitor Anesthesia Care

## 2020-10-06 MED ORDER — LACTATED RINGERS IV SOLN: INTRAVENOUS | Status: DC

## 2020-10-06 MED ORDER — PHENYLEPHRINE 40 MCG/ML (10ML) SYRINGE FOR IV PUSH (FOR BLOOD PRESSURE SUPPORT)
PREFILLED_SYRINGE | INTRAVENOUS | Status: DC | PRN
  Administered 2020-10-06: 80 ug via INTRAVENOUS

## 2020-10-06 MED ORDER — SODIUM CHLORIDE 0.9 % IV SOLN: INTRAVENOUS | Status: DC

## 2020-10-06 MED ORDER — PROPOFOL 500 MG/50ML IV EMUL
INTRAVENOUS | Status: DC | PRN
  Administered 2020-10-06: 150 ug/kg/min via INTRAVENOUS

## 2020-10-06 MED ORDER — PROPOFOL 10 MG/ML IV BOLUS
INTRAVENOUS | Status: DC | PRN
  Administered 2020-10-06 (×2): 30 mg via INTRAVENOUS

## 2020-10-06 SURGICAL SUPPLY — 22 items

## 2020-10-06 NOTE — Anesthesia Postprocedure Evaluation (Addendum)
Anesthesia Post Note  Patient: [name]  Procedure(s) Performed: COLONOSCOPY WITH PROPOFOL (N/A ) POLYPECTOMY     Patient location during evaluation: Endoscopy Anesthesia Type: MAC Level of consciousness: awake and alert Pain management: pain level controlled Vital Signs Assessment: post-procedure vital signs reviewed and stable Respiratory status: spontaneous breathing, nonlabored ventilation, respiratory function stable and patient connected to nasal cannula oxygen Cardiovascular status: blood pressure returned to baseline and stable Postop Assessment: no apparent nausea or vomiting Anesthetic complications: no   No complications documented.  Last Vitals:  Vitals:   10/06/20 1045 10/06/20 1100  BP: 120/71 131/77  Pulse: 62 (!) 59  Resp: 15 20  Temp:  36.5 C  SpO2: 98% 99%    Last Pain:  Vitals:   10/06/20 1100  TempSrc:   PainSc: 0-No pain                 Dareld Mcauliffe L Simaya Lumadue

## 2020-10-06 NOTE — Op Note (Addendum)
[facility] Patient Name: [name] Procedure Date : 10/06/2020 MRN: [id number] Attending MD: Justice Britain , MD Date of Birth: 04-Dec-1950 CSN: [id number] Age: 69 Admit Type: Outpatient Procedure:                Colonoscopy Indications:              High risk colon cancer surveillance: Personal                            history of colonic polyps in 2018 with incomplete                            colonoscopy in 2021 Providers:                Justice Britain, MD, Jeanella Cara, RN,                            Cletis Athens, Technician, Sampson Si, CRNA Referring MD:             Lajuan Lines. Hilarie Fredrickson, MD, Ranell Patrick. Carlota Raspberry, MD Medicines:                Monitored Anesthesia Care Complications:            No immediate complications. Estimated Blood Loss:     Estimated blood loss was minimal. Procedure:                Pre-Anesthesia Assessment:                           - Prior to the procedure, a History and Physical                            was performed, and patient medications and                            allergies were reviewed. The patient's tolerance of                            previous anesthesia was also reviewed. The risks                            and benefits of the procedure and the sedation                            options and risks were discussed with the patient.                            All questions were answered, and informed consent                            was obtained. Prior Anticoagulants: The patient has                            taken Plavix (clopidogrel), last dose was 5 days  prior to procedure. ASA Grade Assessment: II - A                            patient with mild systemic disease. After reviewing                            the risks and benefits, the patient was deemed in                            satisfactory condition to undergo the procedure.                           After obtaining informed  consent, the colonoscope                            was passed under direct vision. Throughout the                            procedure, the patient's blood pressure, pulse, and                            oxygen saturations were monitored continuously. The                            CF-HQ190L ([id number] Olympus colonoscope was                            introduced through the anus and advanced to the the                            cecum, identified by appendiceal orifice and                            ileocecal valve. The colonoscopy was technically                            difficult and complex due to bowel stenosis,                            restricted mobility of the colon and a tortuous                            colon. Successful completion of the procedure was                            aided by changing the patient's position, using                            manual pressure, withdrawing and reinserting the                            scope, straightening and shortening the scope to  obtain bowel loop reduction and using scope                            torsion. The patient tolerated the procedure. The                            quality of the bowel preparation was adequate. The                            ileocecal valve, appendiceal orifice, and rectum                            were photographed. We used an abdominal binder and                            water immersion throughout. Scope In: 8:93:73 AM Scope Out: 10:11:35 AM Scope Withdrawal Time: 0 hours 16 minutes 21 seconds  Total Procedure Duration: 0 hours 25 minutes 18 seconds  Findings:      The digital rectal exam findings include hemorrhoids. Pertinent       negatives include no palpable rectal lesions.      Extrinsic severe stenosis was found throughout the recto-sigmoid colon,       the sigmoid colon and into the descending colon. After significant water       immersion, this area was  traversed.      Three sessile and semi-sessile polyps were found in the transverse colon       (2) and ascending colon (1). The polyps were 3 to 10 mm in size. These       polyps were removed with a cold snare. Resection and retrieval were       complete.      Normal mucosa was found in the entire colon otherwise though hastra were       not present.      Non-bleeding non-thrombosed internal hemorrhoids were found during       retroflexion, during perianal exam and during digital exam. The       hemorrhoids were Grade III (internal hemorrhoids that prolapse but       require manual reduction). Impression:               - Hemorrhoids found on digital rectal exam.                           - Stenosis in the recto-sigmoid colon, the sigmoid                            colon and the descending colon - traversed after                            water immersion.                           - Three 3 to 10 mm polyps in the transverse colon                            and in the ascending colon, removed with a  cold                            snare. Resected and retrieved.                           - Normal mucosa in the entire examined colon.                           - Non-bleeding non-thrombosed internal hemorrhoids. Recommendation:           - The patient will be observed post-procedure,                            until all discharge criteria are met.                           - Discharge patient to home.                           - Patient has a contact number available for                            emergencies. The signs and symptoms of potential                            delayed complications were discussed with the                            patient. Return to normal activities tomorrow.                            Written discharge instructions were provided to the                            patient.                           - High fiber diet.                           - Use FiberCon 1-2  tablets PO daily.                           - Continue present medications.                           - Await pathology results.                           - Restart Plavix in 48 hours (11/10) to decrease                            risk of post-interventional bleeding.                           - Repeat colonoscopy in 3 years for surveillance -  abdominal binder/CO2/water immersion.                           - Recommend consideration of a CT-AP vs                            CT-Enterography to better define what seems to be                            multiple intra-abdominal hernias.                           - The findings and recommendations were discussed                            with the patient.                           - The findings and recommendations were discussed                            with the patient's family. Procedure Code(s):        --- Professional ---                           [phone], Colonoscopy, flexible; with removal of                            tumor(s), polyp(s), or other lesion(s) by snare                            technique Diagnosis Code(s):        --- Professional ---                           Z86.010, Personal history of colonic polyps                           K56.699, Other intestinal obstruction unspecified                            as to partial versus complete obstruction                           K63.5, Polyp of colon                           K64.9, Unspecified hemorrhoids CPT copyright 2019 [employer]. All rights reserved. The codes documented in this report are preliminary and upon coder review may  be revised to meet current compliance requirements. Justice Britain, MD 10/06/2020 10:35:37 AM Number of Addenda: 0

## 2020-10-06 NOTE — H&P (Signed)
GASTROENTEROLOGY PROCEDURE H&P NOTE   Primary Care Physician: [name], MD  HPI: [name] is a 69 y.o. female who presents for Colonoscopy attempt after incomplete colonoscopy in 2021 with history of polyps.  Past Medical History:  Diagnosis Date  . Anemia   . Arthritis   . Cardiomyopathy ([place])   . Cholelithiasis   . Chronic back pain   . Constipation   . Esophageal reflux   . Fibromyalgia   . Hyperlipidemia   . Hypertension   . Hypothyroid   . IBS (irritable bowel syndrome)   . Internal hemorrhoids without mention of complication   . Left leg pain   . Myocardial infarction ([place])   . Personal history of colonic polyps 05/06/2010   ADENOMATOUS POLYP  . Pneumonia    Past Surgical History:  Procedure Laterality Date  . ABDOMINAL HYSTERECTOMY    . CERVICAL DISC SURGERY     PLATE IN NECK & BACK  . CHOLECYSTECTOMY    . COLONOSCOPY  09/27/2011   NORMAL   . LEFT HEART CATH AND CORONARY ANGIOGRAPHY N/A 05/08/2018   Procedure: LEFT HEART CATH AND CORONARY ANGIOGRAPHY;  Surgeon: Belva Crome, MD;  Location: [facility];  Service: Cardiovascular;  Laterality: N/A;  . LUMBAR [place]    . TONSILLECTOMY     No current facility-administered medications for this encounter.   Allergies  Allergen Reactions  . Aspirin Rash    unknown  . Gadolinium Hives    patient unsure of which kind of dye she had a reaction to until reaction to Magnevist - may be allergic to x-ray contrast, Onset Date: [id number]   . Iohexol Hives    patient unsure of which kind of dye she had a reaction to until reaction to Magnevist - may be allergic to x-ray contrast, Onset Date: [id number]  . Livalo [Pitavastatin] Other (See Comments)    myalgias  . Hydrocodone-Acetaminophen Rash  . Lipitor [Atorvastatin] Other (See Comments)    Myalgias   Family History  Problem Relation Age of Onset  . Diabetes Mother        aunts and uncles  . Kidney disease Mother        stage 3  . Lung  disease Mother        colapsed lung/in hospice  . COPD Mother   . Hypertension Mother   . Dementia Mother   . Breast cancer Maternal Aunt   . Heart failure Father        Mother  . Breast cancer Cousin   . Colon cancer Neg Hx   . Esophageal cancer Neg Hx   . Stomach cancer Neg Hx   . Rectal cancer Neg Hx    Social History   Socioeconomic History  . Marital status: Widowed    Spouse name: Not on file  . Number of children: 2  . Years of education: Not on file  . Highest education level: Not on file  Occupational History  . Occupation: housewife    Employer: UNEMPLOYED   Tobacco Use  . Smoking status: Never Smoker  . Smokeless tobacco: Never Used  Vaping Use  . Vaping Use: Never used  Substance and Sexual Activity  . Alcohol use: No    Alcohol/week: 0.0 standard drinks  . Drug use: No  . Sexual activity: Not Currently  Other Topics Concern  . Not on file  Social History Narrative   Daily Caffeine Use   Social Determinants of Health  Financial Resource Strain: Low Risk   . Difficulty of Paying Living Expenses: Not very hard  Food Insecurity:   . Worried About Charity fundraiser in the Last Year: Not on file  . Ran Out of Food in the Last Year: Not on file  Transportation Needs:   . Lack of Transportation (Medical): Not on file  . Lack of Transportation (Non-Medical): Not on file  Physical Activity:   . Days of Exercise per Week: Not on file  . Minutes of Exercise per Session: Not on file  Stress: No Stress Concern Present  . Feeling of Stress : Only a little  Social Connections:   . Frequency of Communication with Friends and Family: Not on file  . Frequency of Social Gatherings with Friends and Family: Not on file  . Attends Religious Services: Not on file  . Active Member of Clubs or Organizations: Not on file  . Attends Archivist Meetings: Not on file  . Marital Status: Not on file  Intimate Partner Violence:   . Fear of Current or  Ex-Partner: Not on file  . Emotionally Abused: Not on file  . Physically Abused: Not on file  . Sexually Abused: Not on file    Physical Exam: Vital signs in last 24 hours:     GEN: NAD EYE: Sclerae anicteric ENT: MMM CV: Non-tachycardic GI: Soft, NT/ND NEURO:  Alert & Oriented x 3  Lab Results: No results for input(s): WBC, HGB, HCT, PLT in the last 72 hours. BMET No results for input(s): NA, K, CL, CO2, GLUCOSE, BUN, CREATININE, CALCIUM in the last 72 hours. LFT No results for input(s): PROT, ALBUMIN, AST, ALT, ALKPHOS, BILITOT, BILIDIR, IBILI in the last 72 hours. PT/INR No results for input(s): LABPROT, INR in the last 72 hours.   Impression / Plan: This is a 69 y.o.female who presents for Colonoscopy attempt after incomplete colonoscopy in 2021 with history of polyps.  The risks and benefits of endoscopic evaluation were discussed with the patient; these include but are not limited to the risk of perforation, infection, bleeding, missed lesions, lack of diagnosis, severe illness requiring hospitalization, as well as anesthesia and sedation related illnesses.  The patient is agreeable to proceed.    Justice Britain, MD [facility] Office # [phone]

## 2020-10-06 NOTE — Anesthesia Preprocedure Evaluation (Addendum)
Anesthesia Evaluation  Patient identified by MRN, date of birth, ID band Patient awake    Reviewed: Allergy & Precautions, NPO status , Patient's Chart, lab work & pertinent test results, reviewed documented beta blocker date and time   Airway Mallampati: I  TM Distance: >3 FB Neck ROM: Full    Dental  (+) Edentulous Upper, Edentulous Lower, Lower Dentures, Upper Dentures, Dental Advisory Given   Pulmonary neg pulmonary ROS,    Pulmonary exam normal breath sounds clear to auscultation       Cardiovascular hypertension, Pt. on home beta blockers and Pt. on medications + CAD and + Past MI  Normal cardiovascular exam Rhythm:Regular Rate:Normal  TTE 2020 1. Left ventricular ejection fraction, by visual estimation, is 70 to 75%. The left ventricle has hyperdynamic function. Normal left ventricular size. There is no left ventricular hypertrophy.  2. Elevated left ventricular end-diastolic pressure.  3. Left ventricular diastolic Doppler parameters are consistent with impaired relaxation pattern of LV diastolic filling.  4. Global right ventricle has normal systolic function.The right ventricular size is normal. No increase in right ventricular wall thickness.  5. Left atrial size was normal.  6. Right atrial size was normal.  7. The mitral valve is normal in structure. No evidence of mitral valve regurgitation. No evidence of mitral stenosis.  8. The tricuspid valve is normal in structure. Tricuspid valve regurgitation was not visualized by color flow Doppler.  9. The aortic valve is normal in structure. Aortic valve regurgitation  was not visualized by color flow Doppler. Structurally normal aortic valve, with no evidence of sclerosis or stenosis.  10. The pulmonic valve was normal in structure. Pulmonic valve regurgitation is not visualized by color flow Doppler.  11. The inferior vena cava is normal in size with greater than 50%  respiratory variability, suggesting right atrial pressure of 3 mmHg.  LHC 2019 Acute coronary syndrome with anterior wall motion abnormality and essentially widely patent coronary arteries.  Suspect variant stress cardiomyopathy.  (Cannot totally exclude transient occlusion and recanalization of the first diagonal which is relatively small and has moderate somewhat hazy disease in the mid segment up to 70%.) Normal left main LAD is large, wraps around the left ventricular apex, and gives origin to 3 diagonal branches, the second of which is discussed above with mid vessel 70% narrowing in an artery that is less than 2 mm in diameter.  Beyond that there is either a small branch or occlusion of the branch.  Images are ambiguous. Circumflex is large, first marginal and second marginal widely patent.  Second marginal bifurcates. Right coronary is tortuous but widely patent. Abnormal left ventricular wall motion with mid to distal anterior wall akinesis, EF 45%, and elevated LVEDP consistent with acute combined systolic and diastolic heart failure.    Neuro/Psych PSYCHIATRIC DISORDERS Anxiety negative neurological ROS     GI/Hepatic Neg liver ROS, GERD  Medicated,  Endo/Other  Hypothyroidism   Renal/GU negative Renal ROS  negative genitourinary   Musculoskeletal  (+) Arthritis , Fibromyalgia -  Abdominal   Peds  Hematology  (+) Blood dyscrasia (on plavix), ,   Anesthesia Other Findings Colonoscopy for h/o polyps  Reproductive/Obstetrics                          Anesthesia Physical Anesthesia Plan  ASA: III  Anesthesia Plan: MAC   Post-op Pain Management:    Induction: Intravenous  PONV Risk Score and Plan: 2 and Propofol infusion  and Treatment may vary due to age or medical condition  Airway Management Planned: Natural Airway  Additional Equipment:   Intra-op Plan:   Post-operative Plan:   Informed Consent: I have reviewed the patients  History and Physical, chart, labs and discussed the procedure including the risks, benefits and alternatives for the proposed anesthesia with the patient or authorized representative who has indicated his/her understanding and acceptance.     Dental advisory given  Plan Discussed with: CRNA  Anesthesia Plan Comments:        Anesthesia Quick Evaluation

## 2020-10-06 NOTE — Transfer of Care (Signed)
Immediate Anesthesia Transfer of Care Note  Patient: [name]  Procedure(s) Performed: COLONOSCOPY WITH PROPOFOL (N/A ) POLYPECTOMY  Patient Location: PACU  Anesthesia Type:MAC  Level of Consciousness: drowsy and patient cooperative  Airway & Oxygen Therapy: Patient Spontanous Breathing and Patient connected to nasal cannula oxygen  Post-op Assessment: Report given to RN  Post vital signs: Reviewed and stable  Last Vitals:  Vitals Value Taken Time  BP    Temp    Pulse    Resp    SpO2      Last Pain:  Vitals:   10/06/20 0758  TempSrc: Temporal  PainSc: 0-No pain         Complications: No complications documented.

## 2020-10-06 NOTE — Anesthesia Procedure Notes (Signed)
Procedure Name: MAC Date/Time: 10/06/2020 9:20 AM Performed by: Barrington Ellison, CRNA Pre-anesthesia Checklist: Patient identified, Emergency Drugs available, Suction available, Patient being monitored and Timeout performed Patient Re-evaluated:Patient Re-evaluated prior to induction Oxygen Delivery Method: Nasal cannula

## 2020-10-07 LAB — SURGICAL PATHOLOGY

## 2020-10-07 NOTE — Addendum Note (Signed)
Addendum  created 10/07/20 1103 by Freddrick March, MD   Clinical Note Signed, SmartForm saved

## 2020-10-08 ENCOUNTER — Encounter (HOSPITAL_COMMUNITY): Payer: Self-pay | Admitting: Gastroenterology

## 2020-10-09 ENCOUNTER — Encounter: Payer: Self-pay | Admitting: Gastroenterology

## 2020-10-13 ENCOUNTER — Telehealth: Payer: Self-pay | Admitting: Pharmacist

## 2020-10-13 ENCOUNTER — Other Ambulatory Visit: Payer: Self-pay

## 2020-10-13 NOTE — Progress Notes (Signed)
Chronic Care Management Pharmacy Assistant   Name: [name]  MRN: [id number] DOB: 09-01-1951  Reason for Encounter: Medication Review   PCP : [name], MD  Allergies:   Allergies  Allergen Reactions  . Aspirin Rash    unknown  . Gadolinium Hives    patient unsure of which kind of dye she had a reaction to until reaction to Magnevist - may be allergic to x-ray contrast, Onset Date: [id number]   . Iohexol Hives    patient unsure of which kind of dye she had a reaction to until reaction to Magnevist - may be allergic to x-ray contrast, Onset Date: [id number]  . Livalo [Pitavastatin] Other (See Comments)    myalgias  . Hydrocodone-Acetaminophen Rash  . Lipitor [Atorvastatin] Other (See Comments)    Myalgias    Medications: Outpatient Encounter Medications as of 10/13/2020  Medication Sig  . amLODipine (NORVASC) 5 MG tablet Take 1 tablet (5 mg total) by mouth daily.  . carvedilol (COREG) 6.25 MG tablet Take 1 tablet (6.25 mg total) by mouth 2 (two) times daily with a meal.  . clopidogrel (PLAVIX) 75 MG tablet Take 1 tablet (75 mg total) by mouth daily with breakfast.  . famotidine (PEPCID) 40 MG tablet TAKE ONE TABLET BY MOUTH EVERY MORNING (Patient taking differently: Take 40 mg by mouth daily. )  . guaiFENesin (MUCINEX) 600 MG 12 hr tablet Take 2 tablets (1,200 mg total) by mouth 2 (two) times daily as needed. (Patient taking differently: Take 1,200 mg by mouth 2 (two) times daily as needed for to loosen phlegm. )  . isosorbide mononitrate (IMDUR) 30 MG 24 hr tablet Take 1 tablet (30 mg total) by mouth daily.  Marland Kitchen levocetirizine (XYZAL) 5 MG tablet Take 1 tablet (5 mg total) by mouth every evening. (Patient taking differently: Take 5 mg by mouth at bedtime. )  . levothyroxine (SYNTHROID) 88 MCG tablet TAKE ONE TABLET BY MOUTH EVERY MORNING (Patient taking differently: Take 88 mcg by mouth daily before breakfast. )  . linaclotide (LINZESS) 145 MCG CAPS capsule Take 1  capsule (145 mcg total) by mouth daily before breakfast. (Patient taking differently: Take 145 mcg by mouth daily as needed (Constipation). )  . losartan (COZAAR) 25 MG tablet Take 1 tablet (25 mg total) by mouth daily.  . magnesium oxide (MAG-OX) 400 MG tablet Take 400 mg by mouth daily.  . Na Sulfate-K Sulfate-Mg Sulf (SUPREP BOWEL PREP KIT PO) Take 1 kit by mouth once. Colonoscopy bowel prep  ( already had kit from prev. Cancelled colon)   . nitroGLYCERIN (NITROSTAT) 0.4 MG SL tablet Place 1 tablet (0.4 mg total) under the tongue every 5 (five) minutes as needed for chest pain.  . pantoprazole (PROTONIX) 20 MG tablet TAKE ONE TABLET BY MOUTH EVERY MORNING (Patient taking differently: Take 20 mg by mouth daily. )  . potassium chloride SA (KLOR-CON) 20 MEQ tablet Take 1 tablet (20 mEq total) by mouth 2 (two) times daily.  . rosuvastatin (CRESTOR) 10 MG tablet Take 1 tablet (10 mg total) by mouth daily. (Patient taking differently: Take 10 mg by mouth at bedtime. )  . Vitamin D, Ergocalciferol, (DRISDOL) 1.25 MG (50000 UNIT) CAPS capsule TAKE ONE CAPSULE BY MOUTH ONCE A WEEK ON SATURDAY (Patient taking differently: Take 50,000 Units by mouth every Saturday. )  . [DISCONTINUED] levothyroxine (SYNTHROID) 88 MCG tablet Take 1 tablet (88 mcg total) by mouth daily.   No facility-administered encounter medications on file as of  10/13/2020.    Current Diagnosis: Patient Active Problem List   Diagnosis Date Noted  . Hypokalemia due to inadequate potassium intake 07/01/2020  . Hirsutism 05/21/2020  . Balding 05/21/2020  . Allergic rhinitis 04/12/2020  . Takotsubo cardiomyopathy 02/26/2019  . NASH (nonalcoholic steatohepatitis) 01/22/2019  . Chronic idiopathic constipation 01/12/2019  . Arthritis of carpometacarpal [facility]) joint of left thumb 09/25/2018  . Degenerative arthritis of left knee 05/24/2018  . CAD (coronary artery disease) 05/16/2018  . Vitamin D deficiency 11/11/2017  . Degenerative disc  disease, lumbar 08/29/2017  . Prediabetes 06/14/2016  . Obesity (BMI 30.0-34.9) 06/14/2016  . Routine general medical examination at a health care facility 12/31/2014  . Osteopenia 07/20/2013  . GERD (gastroesophageal reflux disease) 09/16/2011  . Generalized anxiety disorder 09/16/2011  . Constipation, slow transit 09/16/2011  . Personal history of colonic polyps 08/17/2011  . DJD (degenerative joint disease) of knee 06/25/2011  . Pure hyperglyceridemia 06/24/2011  . Hyperlipidemia LDL goal <70 07/30/2009  . Hypothyroidism 04/25/2009  . Essential hypertension 04/25/2009  . FIBROMYALGIA 04/25/2009    Goals Addressed   None     Follow-Up:  Pharmacist Review  Reviewed chart for medication changes ahead of medication coordination call.  No OVs, Consults, or hospital visits since last care coordination call/Pharmacist visit. (If appropriate, list visit date, provider name)  No medication changes indicated OR if recent visit, treatment plan here.  BP Readings from Last 3 Encounters:  10/06/20 131/77  08/05/20 (!) 170/86  06/30/20 (!) 130/82    Lab Results  Component Value Date   HGBA1C 5.9 (H) 06/30/2020     Patient obtains medications through Adherence Packaging  90 Days   Last adherence delivery included:   Magnesium Oxide 400 mg 1 tab once daily Carvedilol 6.25 mg 1 tab at breakfast and bedtime Amlodipine 5 mg 1 tab every morning Isosorbide 1 ab every morning Losartan pot 25 mg 1 tab every morning  Rosuvastatin 10 mg 1 tab  everyday bedtime Clopidogrel 75 mg 1 tab every morning Levothyroxine 88 mcg 1 tab before breakfast Famotidine 40 mg 1 tab every morning  Pantoprazole 20 mg 1 tab every morning Vitamin D2 1,250 mcg 1 cap once weekly on Saturday Potassium Cl Er 20 meq 1 tab twice daily levocetirizine 5 mg 1 tab everyday bedtime   Patient declined Magnesium Oxide 400 mg 1 tab once daily Carvedilol 6.25 mg 1 tab at breakfast and bedtime Amlodipine 5 mg 1 tab  every morning Isosorbide 1 ab every morning Losartan pot 25 mg 1 tab every morning  Rosuvastatin 10 mg 1 tab  everyday bedtime Clopidogrel 75 mg 1 tab every morning Levothyroxine 88 mcg 1 tab before breakfast Famotidine 40 mg 1 tab every morning  Pantoprazole 20 mg 1 tab every morning Vitamin D2 1,250 mcg 1 cap once weekly on Saturday Potassium Cl Er 20 meq 1 tab twice daily levocetirizine 5 mg 1 tab everyday bedtime   last month due to PRN use/additional supply on hand. Explanation of abundance on hand (ie #30 due to overlapping fills or previous adherence issues etc)  Patient is due for next adherence delivery on: 11/20/20. Called patient and reviewed medications and coordinated delivery.  This delivery to include:  Confirmed delivery date of 11/20/20, advised patient that pharmacy will contact them the morning of delivery.

## 2020-10-15 ENCOUNTER — Other Ambulatory Visit: Payer: Self-pay

## 2020-10-15 DIAGNOSIS — K469 Unspecified abdominal hernia without obstruction or gangrene: Secondary | ICD-10-CM

## 2020-10-15 DIAGNOSIS — R109 Unspecified abdominal pain: Secondary | ICD-10-CM

## 2020-10-16 ENCOUNTER — Other Ambulatory Visit: Payer: Self-pay

## 2020-10-16 ENCOUNTER — Ambulatory Visit (INDEPENDENT_AMBULATORY_CARE_PROVIDER_SITE_OTHER): Payer: Medicare Other | Admitting: Otolaryngology

## 2020-10-16 ENCOUNTER — Encounter (INDEPENDENT_AMBULATORY_CARE_PROVIDER_SITE_OTHER): Payer: Self-pay | Admitting: Otolaryngology

## 2020-10-16 VITALS — Temp 97.2°F

## 2020-10-16 DIAGNOSIS — H9042 Sensorineural hearing loss, unilateral, left ear, with unrestricted hearing on the contralateral side: Secondary | ICD-10-CM | POA: Diagnosis not present

## 2020-10-16 NOTE — Progress Notes (Signed)
HPI: [name] is a 69 y.o. female who presents is referred by Dr. [name] her PCP for evaluation of left ear problems.  Apparently this started after she had Covid in January of this year.  She was initially treated with antibiotics and steroids.  She still complains of congestion of the left ear as if she has water in the ear.  She has been treated with nasal steroid sprays for eustachian tube dysfunction.  She is referred here because of persistent problems with the left ear..  Past Medical History:  Diagnosis Date  . Anemia   . Arthritis   . Cardiomyopathy ([place])   . Cholelithiasis   . Chronic back pain   . Constipation   . Esophageal reflux   . Fibromyalgia   . Hyperlipidemia   . Hypertension   . Hypothyroid   . IBS (irritable bowel syndrome)   . Internal hemorrhoids without mention of complication   . Left leg pain   . Myocardial infarction ([place])   . Personal history of colonic polyps 05/06/2010   ADENOMATOUS POLYP  . Pneumonia    Past Surgical History:  Procedure Laterality Date  . ABDOMINAL HYSTERECTOMY    . CERVICAL DISC SURGERY     PLATE IN NECK & BACK  . CHOLECYSTECTOMY    . COLONOSCOPY  09/27/2011   NORMAL   . COLONOSCOPY WITH PROPOFOL N/A 10/06/2020   Procedure: COLONOSCOPY WITH PROPOFOL;  Surgeon: Rush Landmark Telford Nab., MD;  Location: [facility];  Service: Gastroenterology;  Laterality: N/A;  ultra slim scope   . LEFT HEART CATH AND CORONARY ANGIOGRAPHY N/A 05/08/2018   Procedure: LEFT HEART CATH AND CORONARY ANGIOGRAPHY;  Surgeon: Belva Crome, MD;  Location: [facility];  Service: Cardiovascular;  Laterality: N/A;  . LUMBAR [place]    . POLYPECTOMY  10/06/2020   Procedure: POLYPECTOMY;  Surgeon: Mansouraty, Telford Nab., MD;  Location: [facility];  Service: Gastroenterology;;  . TONSILLECTOMY     Social History   Socioeconomic History  . Marital status: Widowed    Spouse name: Not on file  . Number of children: 2  . Years of education:  Not on file  . Highest education level: Not on file  Occupational History  . Occupation: housewife    Employer: UNEMPLOYED   Tobacco Use  . Smoking status: Never Smoker  . Smokeless tobacco: Never Used  Vaping Use  . Vaping Use: Never used  Substance and Sexual Activity  . Alcohol use: No    Alcohol/week: 0.0 standard drinks  . Drug use: No  . Sexual activity: Not Currently  Other Topics Concern  . Not on file  Social History Narrative   Daily Caffeine Use   Social Determinants of Health   Financial Resource Strain: Low Risk   . Difficulty of Paying Living Expenses: Not very hard  Food Insecurity:   . Worried About Charity fundraiser in the Last Year: Not on file  . Ran Out of Food in the Last Year: Not on file  Transportation Needs:   . Lack of Transportation (Medical): Not on file  . Lack of Transportation (Non-Medical): Not on file  Physical Activity:   . Days of Exercise per Week: Not on file  . Minutes of Exercise per Session: Not on file  Stress: No Stress Concern Present  . Feeling of Stress : Only a little  Social Connections:   . Frequency of Communication with Friends and Family: Not on file  . Frequency  of Social Gatherings with Friends and Family: Not on file  . Attends Religious Services: Not on file  . Active Member of Clubs or Organizations: Not on file  . Attends Archivist Meetings: Not on file  . Marital Status: Not on file   Family History  Problem Relation Age of Onset  . Diabetes Mother        aunts and uncles  . Kidney disease Mother        stage 3  . Lung disease Mother        colapsed lung/in hospice  . COPD Mother   . Hypertension Mother   . Dementia Mother   . Breast cancer Maternal Aunt   . Heart failure Father        Mother  . Breast cancer Cousin   . Colon cancer Neg Hx   . Esophageal cancer Neg Hx   . Stomach cancer Neg Hx   . Rectal cancer Neg Hx    Allergies  Allergen Reactions  . Aspirin Rash    unknown  .  Gadolinium Hives    patient unsure of which kind of dye she had a reaction to until reaction to Magnevist - may be allergic to x-ray contrast, Onset Date: [id number]   . Iohexol Hives    patient unsure of which kind of dye she had a reaction to until reaction to Magnevist - may be allergic to x-ray contrast, Onset Date: [id number]  . Livalo [Pitavastatin] Other (See Comments)    myalgias  . Hydrocodone-Acetaminophen Rash  . Lipitor [Atorvastatin] Other (See Comments)    Myalgias   Prior to Admission medications   Medication Sig Start Date End Date Taking? Authorizing Provider  amLODipine (NORVASC) 5 MG tablet Take 1 tablet (5 mg total) by mouth daily. 03/04/20  Yes [name], MD  carvedilol (COREG) 6.25 MG tablet Take 1 tablet (6.25 mg total) by mouth 2 (two) times daily with a meal. 03/04/20  Yes [name], MD  clopidogrel (PLAVIX) 75 MG tablet Take 1 tablet (75 mg total) by mouth daily with breakfast. 03/04/20  Yes [name], MD  famotidine (PEPCID) 40 MG tablet TAKE ONE TABLET BY MOUTH EVERY MORNING Patient taking differently: Take 40 mg by mouth daily.  08/14/20  Yes [name], MD  guaiFENesin (MUCINEX) 600 MG 12 hr tablet Take 2 tablets (1,200 mg total) by mouth 2 (two) times daily as needed. Patient taking differently: Take 1,200 mg by mouth 2 (two) times daily as needed for to loosen phlegm.  04/10/20  Yes Biagio Borg, MD  isosorbide mononitrate (IMDUR) 30 MG 24 hr tablet Take 1 tablet (30 mg total) by mouth daily. 03/04/20  Yes [name], MD  levocetirizine (XYZAL) 5 MG tablet Take 1 tablet (5 mg total) by mouth every evening. Patient taking differently: Take 5 mg by mouth at bedtime.  05/21/20  Yes [name], MD  levothyroxine (SYNTHROID) 88 MCG tablet TAKE ONE TABLET BY MOUTH EVERY MORNING Patient taking differently: Take 88 mcg by mouth daily before breakfast.  08/14/20  Yes [name], MD  linaclotide [facility]) 145 MCG CAPS capsule Take 1 capsule (145  mcg total) by mouth daily before breakfast. Patient taking differently: Take 145 mcg by mouth daily as needed (Constipation).  03/20/19  Yes [name], MD  losartan (COZAAR) 25 MG tablet Take 1 tablet (25 mg total) by mouth daily. 03/04/20  Yes [name], MD  magnesium oxide (MAG-OX) 400 MG tablet Take  400 mg by mouth daily.   Yes [provider]  Na Sulfate-K Sulfate-Mg Sulf (SUPREP BOWEL PREP KIT PO) Take 1 kit by mouth once. Colonoscopy bowel prep  ( already had kit from prev. Cancelled colon)    Yes [provider]  nitroGLYCERIN (NITROSTAT) 0.4 MG SL tablet Place 1 tablet (0.4 mg total) under the tongue every 5 (five) minutes as needed for chest pain. 03/04/20  Yes [name], MD  pantoprazole (PROTONIX) 20 MG tablet TAKE ONE TABLET BY MOUTH EVERY MORNING Patient taking differently: Take 20 mg by mouth daily.  08/14/20  Yes [name], MD  potassium chloride SA (KLOR-CON) 20 MEQ tablet Take 1 tablet (20 mEq total) by mouth 2 (two) times daily. 07/01/20  Yes [name], MD  rosuvastatin (CRESTOR) 10 MG tablet Take 1 tablet (10 mg total) by mouth daily. Patient taking differently: Take 10 mg by mouth at bedtime.  03/04/20  Yes [name], MD  Vitamin D, Ergocalciferol, (DRISDOL) 1.25 MG (50000 UNIT) CAPS capsule TAKE ONE CAPSULE BY MOUTH ONCE A WEEK ON SATURDAY Patient taking differently: Take 50,000 Units by mouth every Saturday.  05/26/20  Yes [name], MD  levothyroxine (SYNTHROID) 88 MCG tablet Take 1 tablet (88 mcg total) by mouth daily. 03/03/20   [name], MD     Positive ROS: Otherwise negative  All other systems have been reviewed and were otherwise negative with the exception of those mentioned in the HPI and as above.  Physical Exam: Constitutional: Alert, well-appearing, no acute distress Ears: External ears without lesions or tenderness. Ear canals are clear bilaterally.  Both TMs are clear with good mobility on pneumatic  otoscopy.  On hearing screening with a tuning forks she heard little bit better in the right ear compared to the left with AC > BC bilaterally. Nasal: External nose without lesions. Septum midline with mild rhinitis.. Clear nasal passages bilaterally.  The posterior nasal cavity is clear Oral: Lips and gums without lesions. Tongue and palate mucosa without lesions. Posterior oropharynx clear.  She is status post tonsillectomy. Neck: No palpable adenopathy or masses Respiratory: Breathing comfortably  Skin: No facial/neck lesions or rash noted.  Audiologic testing in the office today demonstrated a mild left ear sensorineural hearing loss compared to the right.  With SRT's of 30 dB on the left side and 20 dB on the right side.  She had type A tympanograms bilaterally.  Procedures  Assessment: Left ear sensorineural hearing loss causing patient's symptoms. No middle ear space abnormality noted on clinical exam today.  Plan: Reviewed the audiogram with the patient in the office today and reviewed with her that her symptoms are related to a mild hearing loss in the left ear I suspect related to the Covid. There is no specific treatment at this point outside of use of hearing aid. Reviewed this with her.   Radene Journey, MD   CC:

## 2020-10-28 ENCOUNTER — Ambulatory Visit (HOSPITAL_COMMUNITY)
Admission: RE | Admit: 2020-10-28 | Discharge: 2020-10-28 | Disposition: A | Discharge status: Home / Self Care | Payer: Medicare Other | Source: Ambulatory Visit | Attending: Gastroenterology | Admitting: Gastroenterology

## 2020-10-28 ENCOUNTER — Other Ambulatory Visit: Payer: Self-pay

## 2020-10-28 ENCOUNTER — Encounter (HOSPITAL_COMMUNITY): Payer: Self-pay

## 2020-10-28 DIAGNOSIS — Z9049 Acquired absence of other specified parts of digestive tract: Secondary | ICD-10-CM | POA: Diagnosis not present

## 2020-10-28 DIAGNOSIS — R109 Unspecified abdominal pain: Secondary | ICD-10-CM | POA: Insufficient documentation

## 2020-10-30 ENCOUNTER — Encounter (INDEPENDENT_AMBULATORY_CARE_PROVIDER_SITE_OTHER): Payer: Self-pay

## 2020-11-10 ENCOUNTER — Telehealth: Payer: Self-pay | Admitting: Pharmacist

## 2020-11-10 NOTE — Progress Notes (Addendum)
Chronic Care Management Pharmacy Assistant   Name: [name]  MRN: [id number] DOB: 1951/01/11  Reason for Encounter: Medication Review  Patient Questions:  1.  Have you seen any other providers since your last visit? No  2.  Any changes in your medicines or health? No    PCP : [name], MD  Allergies:   Allergies  Allergen Reactions   Aspirin Rash    unknown   Gadolinium Hives    patient unsure of which kind of dye she had a reaction to until reaction to Magnevist - may be allergic to x-ray contrast, Onset Date: [id number]    Iohexol Hives    patient unsure of which kind of dye she had a reaction to until reaction to Magnevist - may be allergic to x-ray contrast, Onset Date: [id number]   Livalo [Pitavastatin] Other (See Comments)    myalgias   Hydrocodone-Acetaminophen Rash   Lipitor [Atorvastatin] Other (See Comments)    Myalgias    Medications: Outpatient Encounter Medications as of 11/10/2020  Medication Sig   amLODipine (NORVASC) 5 MG tablet Take 1 tablet (5 mg total) by mouth daily.   carvedilol (COREG) 6.25 MG tablet Take 1 tablet (6.25 mg total) by mouth 2 (two) times daily with a meal.   clopidogrel (PLAVIX) 75 MG tablet Take 1 tablet (75 mg total) by mouth daily with breakfast.   famotidine (PEPCID) 40 MG tablet TAKE ONE TABLET BY MOUTH EVERY MORNING (Patient taking differently: Take 40 mg by mouth daily. )   guaiFENesin (MUCINEX) 600 MG 12 hr tablet Take 2 tablets (1,200 mg total) by mouth 2 (two) times daily as needed. (Patient taking differently: Take 1,200 mg by mouth 2 (two) times daily as needed for to loosen phlegm. )   isosorbide mononitrate (IMDUR) 30 MG 24 hr tablet Take 1 tablet (30 mg total) by mouth daily.   levocetirizine (XYZAL) 5 MG tablet Take 1 tablet (5 mg total) by mouth every evening. (Patient taking differently: Take 5 mg by mouth at bedtime. )   levothyroxine (SYNTHROID) 88 MCG tablet TAKE ONE TABLET BY MOUTH EVERY MORNING (Patient  taking differently: Take 88 mcg by mouth daily before breakfast. )   linaclotide (LINZESS) 145 MCG CAPS capsule Take 1 capsule (145 mcg total) by mouth daily before breakfast. (Patient taking differently: Take 145 mcg by mouth daily as needed (Constipation). )   losartan (COZAAR) 25 MG tablet Take 1 tablet (25 mg total) by mouth daily.   magnesium oxide (MAG-OX) 400 MG tablet Take 400 mg by mouth daily.   Na Sulfate-K Sulfate-Mg Sulf (SUPREP BOWEL PREP KIT PO) Take 1 kit by mouth once. Colonoscopy bowel prep  ( already had kit from prev. Cancelled colon)    nitroGLYCERIN (NITROSTAT) 0.4 MG SL tablet Place 1 tablet (0.4 mg total) under the tongue every 5 (five) minutes as needed for chest pain.   pantoprazole (PROTONIX) 20 MG tablet TAKE ONE TABLET BY MOUTH EVERY MORNING (Patient taking differently: Take 20 mg by mouth daily. )   potassium chloride SA (KLOR-CON) 20 MEQ tablet Take 1 tablet (20 mEq total) by mouth 2 (two) times daily.   rosuvastatin (CRESTOR) 10 MG tablet Take 1 tablet (10 mg total) by mouth daily. (Patient taking differently: Take 10 mg by mouth at bedtime. )   Vitamin D, Ergocalciferol, (DRISDOL) 1.25 MG (50000 UNIT) CAPS capsule TAKE ONE CAPSULE BY MOUTH ONCE A WEEK ON SATURDAY (Patient taking differently: Take 50,000 Units by mouth every  Saturday. )   [DISCONTINUED] levothyroxine (SYNTHROID) 88 MCG tablet Take 1 tablet (88 mcg total) by mouth daily.   No facility-administered encounter medications on file as of 11/10/2020.    Current Diagnosis: Patient Active Problem List   Diagnosis Date Noted   Hypokalemia due to inadequate potassium intake 07/01/2020   Hirsutism 05/21/2020   Balding 05/21/2020   Allergic rhinitis 04/12/2020   Takotsubo cardiomyopathy 02/26/2019   NASH (nonalcoholic steatohepatitis) 01/22/2019   Chronic idiopathic constipation 01/12/2019   Arthritis of carpometacarpal (CMC) joint of left thumb 09/25/2018   Degenerative arthritis of left knee 05/24/2018    CAD (coronary artery disease) 05/16/2018   Vitamin D deficiency 11/11/2017   Degenerative disc disease, lumbar 08/29/2017   Prediabetes 06/14/2016   Obesity (BMI 30.0-34.9) 06/14/2016   Routine general medical examination at a health care facility 12/31/2014   Osteopenia 07/20/2013   GERD (gastroesophageal reflux disease) 09/16/2011   Generalized anxiety disorder 09/16/2011   Constipation, slow transit 09/16/2011   Personal history of colonic polyps 08/17/2011   DJD (degenerative joint disease) of knee 06/25/2011   Pure hyperglyceridemia 06/24/2011   Hyperlipidemia LDL goal <70 07/30/2009   Hypothyroidism 04/25/2009   Essential hypertension 04/25/2009   FIBROMYALGIA 04/25/2009    Goals Addressed   None     Follow-Up:  Coordination of Enhanced Pharmacy Services   Reviewed chart for medication changes ahead of medication coordination call.  No OVs, Consults, or hospital visits since last care coordination call/Pharmacist visit.  No medication changes indicated  BP Readings from Last 3 Encounters:  10/06/20 131/77  08/05/20 (!) 170/86  06/30/20 (!) 130/82    Lab Results  Component Value Date   HGBA1C 5.9 (H) 06/30/2020     Patient obtains medications through Adherence Packaging  90 Days   Last adherence delivery included:  Carvedilol 6.25 mg 1 tab at breakfast and bedtime Amlodipine 5 mg 1 tab every morning Isosorbide 1 ab every morning Losartan pot 25 mg 1 tab every morning  Rosuvastatin 10 mg 1 tab  everyday bedtime Clopidogrel 75 mg 1 tab every morning Levothyroxine 88 mcg 1 tab before breakfast Famotidine 40 mg 1 tab every morning  Pantoprazole 20 mg 1 tab every morning Vitamin D2 1,250 mcg 1 cap once weekly on Saturday Potassium Cl Er 20 meq 1 tab twice daily levocetirizine 5 mg 1 tab everyday bedtime  Patient declined last month  Magnesium Oxide 400 mg 1 tab once daily Carvedilol 6.25 mg 1 tab at breakfast and bedtime Amlodipine 5 mg 1 tab every  morning Isosorbide 1 ab every morning Losartan pot 25 mg 1 tab every morning  Rosuvastatin 10 mg 1 tab  everyday bedtime Clopidogrel 75 mg 1 tab every morning Levothyroxine 88 mcg 1 tab before breakfast Famotidine 40 mg 1 tab every morning  Pantoprazole 20 mg 1 tab every morning Vitamin D2 1,250 mcg 1 cap once weekly on Saturday Potassium Cl Er 20 meq 1 tab twice daily levocetirizine 5 mg 1 tab everyday bedtime Due to patient having enough to PRN use/additional supply on hand. Explanation of abundance on hand (ie #30 due to overlapping fills or previous adherence issues etc)  Patient is due for next adherence delivery on: 11/19/2020. Called patient and reviewed medications and coordinated delivery.  This delivery to include: Carvedilol 6.25 mg 1 tab at breakfast and bedtime Amlodipine 5 mg 1 tab every morning Isosorbide 1 ab every morning Losartan pot 25 mg 1 tab every morning  Rosuvastatin 10 mg 1 tab  everyday  bedtime Clopidogrel 75 mg 1 tab every morning Levothyroxine 88 mcg 1 tab before breakfast Famotidine 40 mg 1 tab every morning  Pantoprazole 20 mg 1 tab every morning Vitamin D2 1,250 mcg 1 cap once weekly on Saturday Potassium Cl Er 20 meq 1 tab twice daily - breakfast, bedtime levocetirizine 5 mg 1 tab everyday bedtime   Patient declined the following medications magnesium oxide due to the patient states that she no longer is taking   Patient needs refills for levocetirizine, potassium, and Vitamin D. Pharmacy to request.   Confirmed delivery date of 11/19/2020, advised patient that pharmacy will contact them the morning of delivery.   Wendy Poet, Clinical Pharmacist Assistant [facility]

## 2020-11-17 ENCOUNTER — Other Ambulatory Visit: Payer: Self-pay | Admitting: Internal Medicine

## 2020-11-17 DIAGNOSIS — J302 Other seasonal allergic rhinitis: Secondary | ICD-10-CM

## 2020-12-09 ENCOUNTER — Telehealth: Payer: Self-pay | Admitting: Pharmacist

## 2020-12-09 NOTE — Progress Notes (Signed)
Chronic Care Management Pharmacy Assistant   Name: [name]  MRN: [id number] DOB: 1951/06/21  Reason for Encounter: Medication Review   PCP : [name], MD  Allergies:   Allergies  Allergen Reactions   Aspirin Rash    unknown   Gadolinium Hives    patient unsure of which kind of dye she had a reaction to until reaction to Magnevist - may be allergic to x-ray contrast, Onset Date: [id number]    Iohexol Hives    patient unsure of which kind of dye she had a reaction to until reaction to Magnevist - may be allergic to x-ray contrast, Onset Date: [id number]   Livalo [Pitavastatin] Other (See Comments)    myalgias   Hydrocodone-Acetaminophen Rash   Lipitor [Atorvastatin] Other (See Comments)    Myalgias    Medications: Outpatient Encounter Medications as of 12/09/2020  Medication Sig   amLODipine (NORVASC) 5 MG tablet Take 1 tablet (5 mg total) by mouth daily.   carvedilol (COREG) 6.25 MG tablet Take 1 tablet (6.25 mg total) by mouth 2 (two) times daily with a meal.   clopidogrel (PLAVIX) 75 MG tablet Take 1 tablet (75 mg total) by mouth daily with breakfast.   famotidine (PEPCID) 40 MG tablet TAKE ONE TABLET BY MOUTH EVERY MORNING (Patient taking differently: Take 40 mg by mouth daily. )   guaiFENesin (MUCINEX) 600 MG 12 hr tablet Take 2 tablets (1,200 mg total) by mouth 2 (two) times daily as needed. (Patient taking differently: Take 1,200 mg by mouth 2 (two) times daily as needed for to loosen phlegm. )   isosorbide mononitrate (IMDUR) 30 MG 24 hr tablet Take 1 tablet (30 mg total) by mouth daily.   levocetirizine (XYZAL) 5 MG tablet Take 1 tablet (5 mg total) by mouth at bedtime.   levothyroxine (SYNTHROID) 88 MCG tablet TAKE ONE TABLET BY MOUTH EVERY MORNING (Patient taking differently: Take 88 mcg by mouth daily before breakfast. )   linaclotide (LINZESS) 145 MCG CAPS capsule Take 1 capsule (145 mcg total) by mouth daily before breakfast. (Patient taking  differently: Take 145 mcg by mouth daily as needed (Constipation). )   losartan (COZAAR) 25 MG tablet Take 1 tablet (25 mg total) by mouth daily.   magnesium oxide (MAG-OX) 400 MG tablet Take 400 mg by mouth daily.   Na Sulfate-K Sulfate-Mg Sulf (SUPREP BOWEL PREP KIT PO) Take 1 kit by mouth once. Colonoscopy bowel prep  ( already had kit from prev. Cancelled colon)    nitroGLYCERIN (NITROSTAT) 0.4 MG SL tablet Place 1 tablet (0.4 mg total) under the tongue every 5 (five) minutes as needed for chest pain.   pantoprazole (PROTONIX) 20 MG tablet TAKE ONE TABLET BY MOUTH EVERY MORNING (Patient taking differently: Take 20 mg by mouth daily. )   potassium chloride SA (KLOR-CON) 20 MEQ tablet Take 1 tablet (20 mEq total) by mouth 2 (two) times daily.   rosuvastatin (CRESTOR) 10 MG tablet Take 1 tablet (10 mg total) by mouth daily. (Patient taking differently: Take 10 mg by mouth at bedtime. )   Vitamin D, Ergocalciferol, (DRISDOL) 1.25 MG (50000 UNIT) CAPS capsule TAKE ONE CAPSULE BY MOUTH ONCE A WEEK ON SATURDAY (Patient taking differently: Take 50,000 Units by mouth every Saturday. )   No facility-administered encounter medications on file as of 12/09/2020.    Current Diagnosis: Patient Active Problem List   Diagnosis Date Noted   Hypokalemia due to inadequate potassium intake 07/01/2020   Hirsutism 05/21/2020  Balding 05/21/2020   Allergic rhinitis 04/12/2020   Takotsubo cardiomyopathy 02/26/2019   NASH (nonalcoholic steatohepatitis) 01/22/2019   Chronic idiopathic constipation 01/12/2019   Arthritis of carpometacarpal [facility]) joint of left thumb 09/25/2018   Degenerative arthritis of left knee 05/24/2018   CAD (coronary artery disease) 05/16/2018   Vitamin D deficiency 11/11/2017   Degenerative disc disease, lumbar 08/29/2017   Prediabetes 06/14/2016   Obesity (BMI 30.0-34.9) 06/14/2016   Routine general medical examination at a health care facility 12/31/2014    Osteopenia 07/20/2013   GERD (gastroesophageal reflux disease) 09/16/2011   Generalized anxiety disorder 09/16/2011   Constipation, slow transit 09/16/2011   Personal history of colonic polyps 08/17/2011   DJD (degenerative joint disease) of knee 06/25/2011   Pure hyperglyceridemia 06/24/2011   Hyperlipidemia LDL goal <70 07/30/2009   Hypothyroidism 04/25/2009   Essential hypertension 04/25/2009   FIBROMYALGIA 04/25/2009    Goals Addressed   None       BP Readings from Last 3 Encounters:  10/06/20 131/77  08/05/20 (!) 170/86  06/30/20 (!) 130/82    Lab Results  Component Value Date   HGBA1C 5.9 (H) 06/30/2020     Patient obtains medications through Adherence Packaging  90 Days   Last adherence delivery included (date:11/17/20): (medication name and frequency) Magnesium Oxide 400 mg 1 tab once daily Carvedilol 6.25 mg 1 tab at breakfast and bedtime Amlodipine 5 mg 1 tab every morning Isosorbide 1 ab every morning Losartan pot 25 mg 1 tab every morning  Rosuvastatin 10 mg 1 tab  everyday bedtime Clopidogrel 75 mg 1 tab every morning Levothyroxine 88 mcg 1 tab before breakfast Famotidine 40 mg 1 tab every morning  Pantoprazole 20 mg 1 tab every morning Vitamin D2 1,250 mcg 1 cap once weekly on Saturday Potassium Cl Er 20 meq 1 tab twice daily levocetirizine 5 mg 1 tab everyday bedtime  Patient declined last delivery due to patient received a 69 DS    Patient is due for next adherence delivery on: 02/15/2021. Called patient and reviewed medication needs.  Patient declined need for refills at this time. Patient is aware of how to contact pharmacist if refills are needed prior to next adherence delivery.   Wendy Poet, [place]   Wendy Poet

## 2020-12-31 ENCOUNTER — Ambulatory Visit (INDEPENDENT_AMBULATORY_CARE_PROVIDER_SITE_OTHER): Payer: Medicare Other | Admitting: Internal Medicine

## 2020-12-31 ENCOUNTER — Other Ambulatory Visit: Payer: Self-pay

## 2020-12-31 ENCOUNTER — Encounter: Payer: Self-pay | Admitting: Internal Medicine

## 2020-12-31 ENCOUNTER — Ambulatory Visit (INDEPENDENT_AMBULATORY_CARE_PROVIDER_SITE_OTHER): Payer: Medicare Other

## 2020-12-31 VITALS — BP 122/78 | HR 64 | Temp 98.3°F | Resp 16 | Ht 67.0 in | Wt 177.0 lb

## 2020-12-31 DIAGNOSIS — R058 Other specified cough: Secondary | ICD-10-CM | POA: Diagnosis not present

## 2020-12-31 DIAGNOSIS — I10 Essential (primary) hypertension: Secondary | ICD-10-CM

## 2020-12-31 DIAGNOSIS — E039 Hypothyroidism, unspecified: Secondary | ICD-10-CM

## 2020-12-31 DIAGNOSIS — E876 Hypokalemia: Secondary | ICD-10-CM | POA: Diagnosis not present

## 2020-12-31 DIAGNOSIS — R7303 Prediabetes: Secondary | ICD-10-CM | POA: Diagnosis not present

## 2020-12-31 DIAGNOSIS — R059 Cough, unspecified: Secondary | ICD-10-CM | POA: Diagnosis not present

## 2020-12-31 DIAGNOSIS — Z23 Encounter for immunization: Secondary | ICD-10-CM

## 2020-12-31 DIAGNOSIS — R300 Dysuria: Secondary | ICD-10-CM | POA: Diagnosis not present

## 2020-12-31 LAB — CBC WITH DIFFERENTIAL/PLATELET
Basophils Absolute: 0 10*3/uL (ref 0.0–0.1)
Basophils Relative: 0.7 % (ref 0.0–3.0)
Eosinophils Absolute: 0.2 10*3/uL (ref 0.0–0.7)
Eosinophils Relative: 2.6 % (ref 0.0–5.0)
HCT: 38.6 % (ref 36.0–46.0)
Hemoglobin: 12.8 g/dL (ref 12.0–15.0)
Lymphocytes Relative: 37.5 % (ref 12.0–46.0)
Lymphs Abs: 2.6 10*3/uL (ref 0.7–4.0)
MCHC: 33.2 g/dL (ref 30.0–36.0)
MCV: 80.3 fl (ref 78.0–100.0)
Monocytes Absolute: 0.5 10*3/uL (ref 0.1–1.0)
Monocytes Relative: 7.6 % (ref 3.0–12.0)
Neutro Abs: 3.6 10*3/uL (ref 1.4–7.7)
Neutrophils Relative %: 51.6 % (ref 43.0–77.0)
Platelets: 264 10*3/uL (ref 150.0–400.0)
RBC: 4.8 Mil/uL (ref 3.87–5.11)
RDW: 13.8 % (ref 11.5–15.5)
WBC: 7 10*3/uL (ref 4.0–10.5)

## 2020-12-31 LAB — BASIC METABOLIC PANEL
BUN: 10 mg/dL (ref 6–23)
CO2: 32 mEq/L (ref 19–32)
Calcium: 9.7 mg/dL (ref 8.4–10.5)
Chloride: 104 mEq/L (ref 96–112)
Creatinine, Ser: 0.86 mg/dL (ref 0.40–1.20)
GFR: 68.84 mL/min (ref >60.00)
Glucose, Bld: 104 mg/dL — ABNORMAL HIGH (ref 70–99)
Potassium: 4.4 mEq/L (ref 3.5–5.1)
Sodium: 140 mEq/L (ref 135–145)

## 2020-12-31 LAB — URINALYSIS, ROUTINE W REFLEX MICROSCOPIC
Bilirubin Urine: NEGATIVE
Hgb urine dipstick: NEGATIVE
Ketones, ur: NEGATIVE
Nitrite: NEGATIVE
Specific Gravity, Urine: 1.015 (ref 1.000–1.030)
Total Protein, Urine: NEGATIVE
Urine Glucose: NEGATIVE
Urobilinogen, UA: 0.2 (ref 0.0–1.0)
pH: 6 (ref 5.0–8.0)

## 2020-12-31 LAB — HEMOGLOBIN A1C: Hgb A1c MFr Bld: 6.3 % (ref 4.6–6.5)

## 2020-12-31 LAB — MAGNESIUM: Magnesium: 1.9 mg/dL (ref 1.5–2.5)

## 2020-12-31 LAB — TSH: TSH: 1.92 u[IU]/mL (ref 0.35–4.50)

## 2020-12-31 NOTE — Patient Instructions (Signed)

## 2020-12-31 NOTE — Progress Notes (Signed)
Subjective:  Patient ID: [name], female    DOB: October 06, 1951  Age: 70 y.o. MRN: [id number]  CC: Cough  This visit occurred during the SARS-CoV-2 public health emergency.  Safety protocols were in place, including screening questions prior to the visit, additional usage of staff PPE, and extensive cleaning of exam room while observing appropriate contact time as indicated for disinfecting solutions.    HPI [name] presents for f/up - She tells me she has a remote history of Covid infection and since contracting Covid she has had a mild cough that is rarely productive of yellow phlegm. She denies chest pain, hemoptysis, fever, chills, wheezing, or shortness of breath. She also complains of recent episodes of dysuria.  Outpatient Medications Prior to Visit  Medication Sig Dispense Refill  . amLODipine (NORVASC) 5 MG tablet Take 1 tablet (5 mg total) by mouth daily. 90 tablet 3  . carvedilol (COREG) 6.25 MG tablet Take 1 tablet (6.25 mg total) by mouth 2 (two) times daily with a meal. 180 tablet 3  . clopidogrel (PLAVIX) 75 MG tablet Take 1 tablet (75 mg total) by mouth daily with breakfast. 90 tablet 3  . famotidine (PEPCID) 40 MG tablet TAKE ONE TABLET BY MOUTH EVERY MORNING (Patient taking differently: Take 40 mg by mouth daily.) 90 tablet 1  . guaiFENesin (MUCINEX) 600 MG 12 hr tablet Take 2 tablets (1,200 mg total) by mouth 2 (two) times daily as needed. (Patient taking differently: Take 1,200 mg by mouth 2 (two) times daily as needed for to loosen phlegm.) 60 tablet 1  . isosorbide mononitrate (IMDUR) 30 MG 24 hr tablet Take 1 tablet (30 mg total) by mouth daily. 90 tablet 3  . levocetirizine (XYZAL) 5 MG tablet Take 1 tablet (5 mg total) by mouth at bedtime. 90 tablet 1  . levothyroxine (SYNTHROID) 88 MCG tablet TAKE ONE TABLET BY MOUTH EVERY MORNING (Patient taking differently: Take 88 mcg by mouth daily before breakfast.) 90 tablet 1  . linaclotide (LINZESS) 145 MCG CAPS capsule  Take 1 capsule (145 mcg total) by mouth daily before breakfast. (Patient taking differently: Take 145 mcg by mouth daily as needed (Constipation).) 90 capsule 1  . losartan (COZAAR) 25 MG tablet Take 1 tablet (25 mg total) by mouth daily. 90 tablet 3  . magnesium oxide (MAG-OX) 400 MG tablet Take 400 mg by mouth daily.    . Na Sulfate-K Sulfate-Mg Sulf (SUPREP BOWEL PREP KIT PO) Take 1 kit by mouth once. Colonoscopy bowel prep  ( already had kit from prev. Cancelled colon)     . nitroGLYCERIN (NITROSTAT) 0.4 MG SL tablet Place 1 tablet (0.4 mg total) under the tongue every 5 (five) minutes as needed for chest pain. 25 tablet 6  . pantoprazole (PROTONIX) 20 MG tablet TAKE ONE TABLET BY MOUTH EVERY MORNING (Patient taking differently: Take 20 mg by mouth daily.) 90 tablet 1  . potassium chloride SA (KLOR-CON) 20 MEQ tablet Take 1 tablet (20 mEq total) by mouth 2 (two) times daily. 180 tablet 1  . rosuvastatin (CRESTOR) 10 MG tablet Take 1 tablet (10 mg total) by mouth daily. (Patient taking differently: Take 10 mg by mouth at bedtime.) 90 tablet 3  . Vitamin D, Ergocalciferol, (DRISDOL) 1.25 MG (50000 UNIT) CAPS capsule TAKE ONE CAPSULE BY MOUTH ONCE A WEEK ON SATURDAY (Patient taking differently: Take 50,000 Units by mouth every Saturday.) 12 capsule 0   No facility-administered medications prior to visit.    ROS Review  of Systems  Constitutional: Negative for appetite change, chills, diaphoresis, fatigue and fever.  HENT: Negative.  Negative for sore throat and trouble swallowing.   Eyes: Negative for visual disturbance.  Respiratory: Positive for cough. Negative for chest tightness, shortness of breath and wheezing.   Cardiovascular: Negative for chest pain, palpitations and leg swelling.  Gastrointestinal: Negative for abdominal pain, diarrhea, nausea and vomiting.  Genitourinary: Positive for dysuria. Negative for decreased urine volume, difficulty urinating, flank pain, frequency, hematuria  and urgency.  Musculoskeletal: Negative.  Negative for arthralgias and myalgias.  Skin: Negative.   Neurological: Negative.  Negative for dizziness, weakness, light-headedness and headaches.  Hematological: Negative for adenopathy. Does not bruise/bleed easily.  Psychiatric/Behavioral: Negative.     Objective:  BP 122/78   Pulse 64   Temp 98.3 F (36.8 C) (Oral)   Resp 16   Ht 5' 7"  (1.702 m)   Wt 177 lb (80.3 kg)   SpO2 96%   BMI 27.72 kg/m   BP Readings from Last 3 Encounters:  12/31/20 122/78  10/06/20 131/77  08/05/20 (!) 170/86    Wt Readings from Last 3 Encounters:  12/31/20 177 lb (80.3 kg)  10/06/20 170 lb (77.1 kg)  08/05/20 180 lb (81.6 kg)    Physical Exam Vitals reviewed.  Constitutional:      Appearance: Normal appearance. She is not ill-appearing.  HENT:     Nose: Nose normal.     Mouth/Throat:     Mouth: Mucous membranes are moist.  Eyes:     General: No scleral icterus.    Conjunctiva/sclera: Conjunctivae normal.  Cardiovascular:     Rate and Rhythm: Normal rate and regular rhythm.     Heart sounds: No murmur heard.   Pulmonary:     Effort: Pulmonary effort is normal.     Breath sounds: No stridor. No wheezing, rhonchi or rales.  Abdominal:     General: Abdomen is flat.     Palpations: There is no mass.     Tenderness: There is no abdominal tenderness. There is no guarding.  Musculoskeletal:        General: Normal range of motion.     Cervical back: Neck supple.     Right lower leg: No edema.     Left lower leg: No edema.  Lymphadenopathy:     Cervical: No cervical adenopathy.  Skin:    General: Skin is warm and dry.     Coloration: Skin is not pale.  Neurological:     General: No focal deficit present.     Mental Status: She is alert.  Psychiatric:        Mood and Affect: Mood normal.        Behavior: Behavior normal.     Lab Results  Component Value Date   WBC 7.0 12/31/2020   HGB 12.8 12/31/2020   HCT 38.6 12/31/2020    PLT 264.0 12/31/2020   GLUCOSE 104 (H) 12/31/2020   CHOL 137 06/30/2020   TRIG 140 06/30/2020   HDL 52 06/30/2020   LDLDIRECT 88.0 10/19/2016   LDLCALC 63 06/30/2020   ALT 9 06/30/2020   AST 15 06/30/2020   NA 140 12/31/2020   K 4.4 12/31/2020   CL 104 12/31/2020   CREATININE 0.86 12/31/2020   BUN 10 12/31/2020   CO2 32 12/31/2020   TSH 1.92 12/31/2020   INR 1.01 05/08/2018   HGBA1C 6.3 12/31/2020    CT ABDOMEN PELVIS WO CONTRAST  Result Date: 10/28/2020 CLINICAL DATA:  Abdominal pain, hernia suspected. EXAM: CT ABDOMEN AND PELVIS WITHOUT CONTRAST TECHNIQUE: Multidetector CT imaging of the abdomen and pelvis was performed following the standard protocol without IV contrast. COMPARISON:  None. FINDINGS: Lower chest: Lung bases are clear. Hepatobiliary: No focal hepatic lesion. Postcholecystectomy. No biliary dilatation. Pancreas: Pancreas is normal. No ductal dilatation. No pancreatic inflammation. Spleen: Normal spleen Adrenals/urinary tract: Adrenal glands and kidneys are normal. The ureters and bladder normal. Stomach/Bowel: Stomach, small bowel, appendix, and cecum are normal. The colon and rectosigmoid colon are normal. Vascular/Lymphatic: Abdominal aorta is normal caliber. No periportal or retroperitoneal adenopathy. No pelvic adenopathy. Reproductive: Post hysterectomy.  Adnexa unremarkable Other: No free fluid.  No inguinal hernia.  No ventral hernia. Musculoskeletal: No aggressive osseous lesion. IMPRESSION: 1. No acute findings in the abdomen pelvis. 2. No hernia. 3. Postcholecystectomy and hysterectomy. Electronically Signed   By: Suzy Bouchard M.D.   On: 10/28/2020 09:18   DG Chest 2 View  Result Date: 12/31/2020 CLINICAL DATA:  Cough EXAM: CHEST - 2 VIEW COMPARISON:  12/02/2019 FINDINGS: The heart size and mediastinal contours are within normal limits. Linear atelectasis or scarring at the left lung base. No pleural effusion. No acute osseous abnormality. IMPRESSION: Linear  atelectasis or scarring at the left lung base. Otherwise unremarkable. Electronically Signed   By: Macy Mis M.D.   On: 12/31/2020 14:22    Assessment & Plan:   [name] was seen today for cough.  Diagnoses and all orders for this visit:  Flu vaccine need -     Flu Vaccine QUAD High Dose(Fluad)  Essential hypertension- Her blood pressure is adequately well controlled. -     CBC with Differential/Platelet; Future -     Basic metabolic panel; Future -     Magnesium; Future -     Magnesium -     Basic metabolic panel -     CBC with Differential/Platelet  Acquired hypothyroidism- Her TSH is in the normal range. She will stay on the current dose of levothyroxine. -     TSH; Future -     TSH  Prediabetes- Her A1c is at 6.3%. Medical therapy is not yet indicated. -     Hemoglobin A1c; Future -     Hemoglobin A1c  Hypokalemia due to inadequate potassium intake -     Basic metabolic panel; Future -     Magnesium; Future -     Magnesium -     Basic metabolic panel  Dysuria- Her urine culture is negative for UTI. -     Urinalysis, Routine w reflex microscopic; Future -     CULTURE, URINE COMPREHENSIVE; Future -     CULTURE, URINE COMPREHENSIVE -     Urinalysis, Routine w reflex microscopic  Cough with sputum- Her chest x-ray is negative for mass or infiltrate. I think her cough is chronic bronchitis status post COVID-19 infection. She tells me is not severe enough for her to need to consider treatment options. -     DG Chest 2 View; Future   I am having [name] maintain her linaclotide, rosuvastatin, nitroGLYCERIN, losartan, isosorbide mononitrate, amLODipine, carvedilol, clopidogrel, Mucinex, Vitamin D (Ergocalciferol), Na Sulfate-K Sulfate-Mg Sulf (SUPREP BOWEL PREP KIT PO), potassium chloride SA, famotidine, levothyroxine, pantoprazole, magnesium oxide, and levocetirizine.  No orders of the defined types were placed in this encounter.    Follow-up: Return in about 3  months (around 03/30/2021).  [name], MD

## 2021-01-01 LAB — CULTURE, URINE COMPREHENSIVE

## 2021-01-02 DIAGNOSIS — H40003 Preglaucoma, unspecified, bilateral: Secondary | ICD-10-CM | POA: Diagnosis not present

## 2021-01-03 ENCOUNTER — Encounter: Payer: Self-pay | Admitting: Internal Medicine

## 2021-01-13 ENCOUNTER — Telehealth: Payer: Self-pay | Admitting: Pharmacist

## 2021-01-13 NOTE — Progress Notes (Signed)
Chronic Care Management Pharmacy Assistant   Name: [name]  MRN: [id number] DOB: 11/11/1951  Reason for Encounter: Medication Review   PCP : [name], MD  Allergies:   Allergies  Allergen Reactions  . Aspirin Rash    unknown  . Gadolinium Hives    patient unsure of which kind of dye she had a reaction to until reaction to Magnevist - may be allergic to x-ray contrast, Onset Date: [id number]   . Iohexol Hives    patient unsure of which kind of dye she had a reaction to until reaction to Magnevist - may be allergic to x-ray contrast, Onset Date: [id number]  . Livalo [Pitavastatin] Other (See Comments)    myalgias  . Hydrocodone-Acetaminophen Rash  . Lipitor [Atorvastatin] Other (See Comments)    Myalgias    Medications: Outpatient Encounter Medications as of 01/13/2021  Medication Sig  . amLODipine (NORVASC) 5 MG tablet Take 1 tablet (5 mg total) by mouth daily.  . carvedilol (COREG) 6.25 MG tablet Take 1 tablet (6.25 mg total) by mouth 2 (two) times daily with a meal.  . clopidogrel (PLAVIX) 75 MG tablet Take 1 tablet (75 mg total) by mouth daily with breakfast.  . famotidine (PEPCID) 40 MG tablet TAKE ONE TABLET BY MOUTH EVERY MORNING (Patient taking differently: Take 40 mg by mouth daily.)  . guaiFENesin (MUCINEX) 600 MG 12 hr tablet Take 2 tablets (1,200 mg total) by mouth 2 (two) times daily as needed. (Patient taking differently: Take 1,200 mg by mouth 2 (two) times daily as needed for to loosen phlegm.)  . isosorbide mononitrate (IMDUR) 30 MG 24 hr tablet Take 1 tablet (30 mg total) by mouth daily.  Marland Kitchen levocetirizine (XYZAL) 5 MG tablet Take 1 tablet (5 mg total) by mouth at bedtime.  Marland Kitchen levothyroxine (SYNTHROID) 88 MCG tablet TAKE ONE TABLET BY MOUTH EVERY MORNING (Patient taking differently: Take 88 mcg by mouth daily before breakfast.)  . linaclotide (LINZESS) 145 MCG CAPS capsule Take 1 capsule (145 mcg total) by mouth daily before breakfast. (Patient taking  differently: Take 145 mcg by mouth daily as needed (Constipation).)  . losartan (COZAAR) 25 MG tablet Take 1 tablet (25 mg total) by mouth daily.  . magnesium oxide (MAG-OX) 400 MG tablet Take 400 mg by mouth daily.  . Na Sulfate-K Sulfate-Mg Sulf (SUPREP BOWEL PREP KIT PO) Take 1 kit by mouth once. Colonoscopy bowel prep  ( already had kit from prev. Cancelled colon)   . nitroGLYCERIN (NITROSTAT) 0.4 MG SL tablet Place 1 tablet (0.4 mg total) under the tongue every 5 (five) minutes as needed for chest pain.  . pantoprazole (PROTONIX) 20 MG tablet TAKE ONE TABLET BY MOUTH EVERY MORNING (Patient taking differently: Take 20 mg by mouth daily.)  . potassium chloride SA (KLOR-CON) 20 MEQ tablet Take 1 tablet (20 mEq total) by mouth 2 (two) times daily.  . rosuvastatin (CRESTOR) 10 MG tablet Take 1 tablet (10 mg total) by mouth daily. (Patient taking differently: Take 10 mg by mouth at bedtime.)  . Vitamin D, Ergocalciferol, (DRISDOL) 1.25 MG (50000 UNIT) CAPS capsule TAKE ONE CAPSULE BY MOUTH ONCE A WEEK ON SATURDAY (Patient taking differently: Take 50,000 Units by mouth every Saturday.)   No facility-administered encounter medications on file as of 01/13/2021.    Current Diagnosis: Patient Active Problem List   Diagnosis Date Noted  . Dysuria 12/31/2020  . Cough with sputum 12/31/2020  . Hypokalemia due to inadequate potassium intake 07/01/2020  .  Hirsutism 05/21/2020  . Balding 05/21/2020  . Allergic rhinitis 04/12/2020  . Takotsubo cardiomyopathy 02/26/2019  . NASH (nonalcoholic steatohepatitis) 01/22/2019  . Chronic idiopathic constipation 01/12/2019  . Arthritis of carpometacarpal [facility]) joint of left thumb 09/25/2018  . Degenerative arthritis of left knee 05/24/2018  . CAD (coronary artery disease) 05/16/2018  . Vitamin D deficiency 11/11/2017  . Degenerative disc disease, lumbar 08/29/2017  . Prediabetes 06/14/2016  . Obesity (BMI 30.0-34.9) 06/14/2016  . Routine general medical  examination at a health care facility 12/31/2014  . Osteopenia 07/20/2013  . GERD (gastroesophageal reflux disease) 09/16/2011  . Generalized anxiety disorder 09/16/2011  . Constipation, slow transit 09/16/2011  . Personal history of colonic polyps 08/17/2011  . DJD (degenerative joint disease) of knee 06/25/2011  . Pure hyperglyceridemia 06/24/2011  . Hyperlipidemia LDL goal <70 07/30/2009  . Hypothyroidism 04/25/2009  . Essential hypertension 04/25/2009  . FIBROMYALGIA 04/25/2009    Goals Addressed   None       BP Readings from Last 3 Encounters:  12/31/20 122/78  10/06/20 131/77  08/05/20 (!) 170/86    Lab Results  Component Value Date   HGBA1C 6.3 12/31/2020     Patient obtains medications through Adherence Packaging  90 Days   Last adherence delivery included (date:11/17/20): (medication name and frequency)  Patient declined: Magnesium Oxide 400 mg 1 tab once daily Carvedilol 6.25 mg 1 tab at breakfast and bedtime Amlodipine 5 mg 1 tab every morning Isosorbide 1 ab every morning Losartan pot 25 mg 1 tab every morning  Rosuvastatin 10 mg 1 tab everyday bedtime Clopidogrel 75 mg 1 tab every morning Levothyroxine 88 mcg 1 tab before breakfast Famotidine 40 mg 1 tab every morning  Pantoprazole 20 mg 1 tab every morning Vitamin D2 1,250 mcg 1 cap once weekly on Saturday Potassium Cl Er 20 meq 1 tab twice daily levocetirizine 5 mg 1 tab everyday bedtime   Due to patient received a 90 ds, next delivery is in March.   Patient is due for next adherence delivery on: 02/15/21. Called patient and reviewed medication needs.  Patient declined need for refills at this time. Patient is aware of how to contact pharmacist if refills are needed prior to next adherence delivery.   Wendy Poet, Clinical Pharmacist Assistant [facility] [phone]   Total time: [phone]

## 2021-01-30 DIAGNOSIS — H40013 Open angle with borderline findings, low risk, bilateral: Secondary | ICD-10-CM | POA: Diagnosis not present

## 2021-02-05 ENCOUNTER — Other Ambulatory Visit: Payer: Self-pay | Admitting: Internal Medicine

## 2021-02-05 ENCOUNTER — Other Ambulatory Visit: Payer: Self-pay | Admitting: Cardiovascular Disease

## 2021-02-05 DIAGNOSIS — K2100 Gastro-esophageal reflux disease with esophagitis, without bleeding: Secondary | ICD-10-CM

## 2021-02-05 DIAGNOSIS — E039 Hypothyroidism, unspecified: Secondary | ICD-10-CM

## 2021-02-05 DIAGNOSIS — K219 Gastro-esophageal reflux disease without esophagitis: Secondary | ICD-10-CM

## 2021-02-05 LAB — HM DIABETES EYE EXAM

## 2021-02-10 ENCOUNTER — Telehealth: Payer: Self-pay | Admitting: Pharmacist

## 2021-02-10 NOTE — Chronic Care Management (AMB) (Addendum)
Chronic Care Management Pharmacy Assistant   Name: [name]  MRN: [id number] DOB: May 06, 1951   Reason for Encounter: Medication Review      Recent office visits:  None noted  Recent consult visits: none noted  Hospital visits:  None in previous 6 months  Medications: Outpatient Encounter Medications as of 02/10/2021  Medication Sig   amLODipine (NORVASC) 5 MG tablet Take 1 tablet (5 mg total) by mouth daily.   carvedilol (COREG) 6.25 MG tablet Take 1 tablet (6.25 mg total) by mouth 2 (two) times daily with a meal.   clopidogrel (PLAVIX) 75 MG tablet Take 1 tablet (75 mg total) by mouth every morning.   famotidine (PEPCID) 40 MG tablet TAKE ONE TABLET BY MOUTH EVERY MORNING   guaiFENesin (MUCINEX) 600 MG 12 hr tablet Take 2 tablets (1,200 mg total) by mouth 2 (two) times daily as needed. (Patient taking differently: Take 1,200 mg by mouth 2 (two) times daily as needed for to loosen phlegm.)   isosorbide mononitrate (IMDUR) 30 MG 24 hr tablet Take 1 tablet (30 mg total) by mouth every morning.   levocetirizine (XYZAL) 5 MG tablet Take 1 tablet (5 mg total) by mouth at bedtime.   levothyroxine (SYNTHROID) 88 MCG tablet TAKE ONE TABLET BY MOUTH BEFORE BREAKFAST   linaclotide (LINZESS) 145 MCG CAPS capsule Take 1 capsule (145 mcg total) by mouth daily before breakfast. (Patient taking differently: Take 145 mcg by mouth daily as needed (Constipation).)   losartan (COZAAR) 25 MG tablet Take 1 tablet (25 mg total) by mouth every morning.   magnesium oxide (MAG-OX) 400 MG tablet Take 400 mg by mouth daily.   Na Sulfate-K Sulfate-Mg Sulf (SUPREP BOWEL PREP KIT PO) Take 1 kit by mouth once. Colonoscopy bowel prep  ( already had kit from prev. Cancelled colon)    nitroGLYCERIN (NITROSTAT) 0.4 MG SL tablet Place 1 tablet (0.4 mg total) under the tongue every 5 (five) minutes as needed for chest pain.   pantoprazole (PROTONIX) 20 MG tablet TAKE ONE TABLET BY MOUTH EVERY MORNING    potassium chloride SA (KLOR-CON) 20 MEQ tablet Take 1 tablet (20 mEq total) by mouth 2 (two) times daily.   rosuvastatin (CRESTOR) 10 MG tablet TAKE ONE TABLET BY MOUTH EVERYDAY AT BEDTIME   Vitamin D, Ergocalciferol, (DRISDOL) 1.25 MG (50000 UNIT) CAPS capsule TAKE ONE CAPSULE BY MOUTH ONCE A WEEK ON SATURDAY (Patient taking differently: Take 50,000 Units by mouth every Saturday.)   No facility-administered encounter medications on file as of 02/10/2021.    Reviewed chart for medication changes ahead of medication coordination call.  No OVs, Consults, or hospital visits since last care coordination call/Pharmacist visit. (If appropriate, list visit date, provider name)  No medication changes indicated OR if recent visit, treatment plan here.  BP Readings from Last 3 Encounters:  12/31/20 122/78  10/06/20 131/77  08/05/20 (!) 170/86    Lab Results  Component Value Date   HGBA1C 6.3 12/31/2020     Patient obtains medications through Adherence Packaging  90 Days   Last adherence delivery included:  Pantoprazole 10m take 1 tablet in the morning Famotidine 489mtake 1 tablet in the morning Rosuvastatin 1065make 1 tablet at bedtime Losartan pot 52m17mke 1 tablet in the morning Levothyroxine 88mc92mker 1 tablet before breakfast Isosorbide mononit ER 30mg 40m 1 tablet in the morning Clopidogrel 75mg t35m1 tablet in the morning Carvedilol 6.52mg ta30m tablet at breakfast and at bedtime Amlodipine 5mg58m  take 1 tablet in the morning Levocetirizine 75m take 1 tablet at bedtime Vitamin D 50000 units take 1 caps once weekly on Saturday Pot cl 218m ER take 1 tablet twice daily   Patient is due for next adherence delivery : 02-19-21 Called patient and reviewed medications and coordinated delivery.  This delivery to include: Pantoprazole 2064make 1 tablet in the morning Famotidine 7m24mke 1 tablet in the morning Rosuvastatin 10mg68me 1 tablet at bedtime Losartan pot 25mg 58m  1 tablet in the morning Levothyroxine 88mcg 63mr 1 tablet before breakfast Isosorbide mononit ER 30mg ta62m tablet in the morning Clopidogrel 75mg tak51mtablet in the morning Carvedilol 6.25mg take31mablet at breakfast and at bedtime Amlodipine 5mg take 123mblet in the morning Levocetirizine 5mg take 1 60mlet at bedtime Vitamin D 50000 units take 1 caps once weekly on Saturday Pot cl 20meq ER tak39mtablet twice daily- ask what times she takes Fluticasone spr 50mcg use one23may in each nostril daily    Patient needs refills for: Vitamin D 50000 units take 1 caps once weekly on Saturday- [facility] to request Pot cl 20meq ER take 63mblet twice daily- [facility] to request Rosuvastatin 10mg take 1 tab22mat bedtime- Requested Losartan pot 25mg take 1 tabl42mn the morning- Requested Levothyroxine 88mcg taker 1 tab5mbefore breakfast - [facility] to request Isosorbide mononit ER 30mg take 1 tablet38mthe morning- Requested Clopidogrel 75mg take 1 tablet 63mhe morning - Requested  Confirmed delivery date of 02/19/21, advised patient that pharmacy will contact them the morning of delivery.   Jomayra Heredia ,CMAGeorgiana ShoreacSandiat [phone]

## 2021-02-23 ENCOUNTER — Telehealth: Payer: Self-pay | Admitting: Cardiovascular Disease

## 2021-02-23 NOTE — Telephone Encounter (Signed)
Patient called to set an appt with Dr. [name]. Told patient that Dr. Antionette Char next available appt would be in August. Patient would like for nurse to re-check availability for appt and to call back

## 2021-02-23 NOTE — Telephone Encounter (Signed)
Scheduled the patient for visit with Dr. [name] 07/06/21. She understands she will be called if cancellations occur or if Dr. [name] adds in clinic. She was grateful for call and agrees with plan.

## 2021-03-02 ENCOUNTER — Telehealth: Payer: Medicare Other

## 2021-03-11 ENCOUNTER — Telehealth: Payer: Self-pay | Admitting: Pharmacist

## 2021-03-11 NOTE — Progress Notes (Addendum)
Chronic Care Management Pharmacy Assistant   Name: JAWANDA PASSEY  MRN: [id number] DOB: 1951/08/05   Reason for Encounter: Medication Coordination Call    Recent office visits:  None ID  Recent consult visits:  None ID  Hospital visits:  None in previous 6 months  Medications: Outpatient Encounter Medications as of 03/11/2021  Medication Sig   amLODipine (NORVASC) 5 MG tablet Take 1 tablet (5 mg total) by mouth daily.   carvedilol (COREG) 6.25 MG tablet Take 1 tablet (6.25 mg total) by mouth 2 (two) times daily with a meal.   clopidogrel (PLAVIX) 75 MG tablet Take 1 tablet (75 mg total) by mouth every morning.   famotidine (PEPCID) 40 MG tablet TAKE ONE TABLET BY MOUTH EVERY MORNING   guaiFENesin (MUCINEX) 600 MG 12 hr tablet Take 2 tablets (1,200 mg total) by mouth 2 (two) times daily as needed. (Patient taking differently: Take 1,200 mg by mouth 2 (two) times daily as needed for to loosen phlegm.)   isosorbide mononitrate (IMDUR) 30 MG 24 hr tablet Take 1 tablet (30 mg total) by mouth every morning.   levocetirizine (XYZAL) 5 MG tablet Take 1 tablet (5 mg total) by mouth at bedtime.   levothyroxine (SYNTHROID) 88 MCG tablet TAKE ONE TABLET BY MOUTH BEFORE BREAKFAST   linaclotide (LINZESS) 145 MCG CAPS capsule Take 1 capsule (145 mcg total) by mouth daily before breakfast. (Patient taking differently: Take 145 mcg by mouth daily as needed (Constipation).)   losartan (COZAAR) 25 MG tablet Take 1 tablet (25 mg total) by mouth every morning.   magnesium oxide (MAG-OX) 400 MG tablet Take 400 mg by mouth daily.   Na Sulfate-K Sulfate-Mg Sulf (SUPREP BOWEL PREP KIT PO) Take 1 kit by mouth once. Colonoscopy bowel prep  ( already had kit from prev. Cancelled colon)    nitroGLYCERIN (NITROSTAT) 0.4 MG SL tablet Place 1 tablet (0.4 mg total) under the tongue every 5 (five) minutes as needed for chest pain.   pantoprazole (PROTONIX) 20 MG tablet TAKE ONE TABLET BY MOUTH EVERY MORNING    potassium chloride SA (KLOR-CON) 20 MEQ tablet Take 1 tablet (20 mEq total) by mouth 2 (two) times daily.   rosuvastatin (CRESTOR) 10 MG tablet TAKE ONE TABLET BY MOUTH EVERYDAY AT BEDTIME   Vitamin D, Ergocalciferol, (DRISDOL) 1.25 MG (50000 UNIT) CAPS capsule TAKE ONE CAPSULE BY MOUTH ONCE A WEEK ON SATURDAY (Patient taking differently: Take 50,000 Units by mouth every Saturday.)   No facility-administered encounter medications on file as of 03/11/2021.   Reviewed chart for medication changes ahead of medication coordination call.    BP Readings from Last 3 Encounters:  12/31/20 122/78  10/06/20 131/77  08/05/20 (!) 170/86    Lab Results  Component Value Date   HGBA1C 6.3 12/31/2020     Patient obtains medications through Adherence Packaging  90 Days   Last adherence delivery included (date:02/17/21): (medication name and frequency) Pantoprazole 40m take 1 tablet in the morning Famotidine 492mtake 1 tablet in the morning Rosuvastatin 1014make 1 tablet at bedtime Losartan pot 14m22mke 1 tablet in the morning Levothyroxine 88mc41mker 1 tablet before breakfast Isosorbide mononit ER 30mg 21m 1 tablet in the morning Clopidogrel 75mg t93m1 tablet in the morning Carvedilol 6.14mg ta77m tablet at breakfast and at bedtime Amlodipine 5mg take9mtablet in the morning Levocetirizine 5mg take 11mablet at bedtime Vitamin D 50000 units take 1 caps once weekly on Saturday Pot cl 20meq86m  ER take 1 tablet twice daily    Patient is due for next adherence delivery on: 05/20/21. Called patient and reviewed medication needs.  Patient declined need for refills at this time. Patient is aware of how to contact pharmacist if refills are needed prior to next adherence delivery.   [facility] Pharmacist Assistant [phone]

## 2021-03-27 ENCOUNTER — Encounter: Payer: Self-pay | Admitting: *Deleted

## 2021-03-30 ENCOUNTER — Other Ambulatory Visit: Payer: Self-pay

## 2021-03-30 ENCOUNTER — Telehealth: Payer: Self-pay | Admitting: Internal Medicine

## 2021-03-30 ENCOUNTER — Encounter: Payer: Self-pay | Admitting: Internal Medicine

## 2021-03-30 ENCOUNTER — Ambulatory Visit (INDEPENDENT_AMBULATORY_CARE_PROVIDER_SITE_OTHER): Payer: Medicare Other | Admitting: Internal Medicine

## 2021-03-30 VITALS — BP 132/70 | HR 64 | Temp 97.8°F | Ht 67.0 in | Wt 181.0 lb

## 2021-03-30 DIAGNOSIS — E039 Hypothyroidism, unspecified: Secondary | ICD-10-CM

## 2021-03-30 DIAGNOSIS — I10 Essential (primary) hypertension: Secondary | ICD-10-CM | POA: Diagnosis not present

## 2021-03-30 DIAGNOSIS — M8580 Other specified disorders of bone density and structure, unspecified site: Secondary | ICD-10-CM | POA: Diagnosis not present

## 2021-03-30 LAB — BASIC METABOLIC PANEL
BUN: 11 mg/dL (ref 6–23)
CO2: 28 mEq/L (ref 19–32)
Calcium: 9.6 mg/dL (ref 8.4–10.5)
Chloride: 104 mEq/L (ref 96–112)
Creatinine, Ser: 0.92 mg/dL (ref 0.40–1.20)
GFR: 63.38 mL/min (ref >60.00)
Glucose, Bld: 96 mg/dL (ref 70–99)
Potassium: 4.5 mEq/L (ref 3.5–5.1)
Sodium: 142 mEq/L (ref 135–145)

## 2021-03-30 LAB — TSH: TSH: 5.3 u[IU]/mL — ABNORMAL HIGH (ref 0.35–4.50)

## 2021-03-30 LAB — VITAMIN D 25 HYDROXY (VIT D DEFICIENCY, FRACTURES): VITD: 58.79 ng/mL (ref 30.00–100.00)

## 2021-03-30 MED ORDER — LEVOTHYROXINE SODIUM 100 MCG PO TABS: 100.0000 ug | ORAL_TABLET | Freq: Every day | ORAL | 1 refills | Status: DC

## 2021-03-30 NOTE — Telephone Encounter (Signed)
    Please call patient to discuss lab results

## 2021-03-30 NOTE — Patient Instructions (Signed)
Hypothyroidism  Hypothyroidism is when the thyroid gland does not make enough of certain hormones (it is underactive). The thyroid gland is a small gland located in the lower front part of the neck, just in front of the windpipe (trachea). This gland makes hormones that help control how the body uses food for energy (metabolism) as well as how the heart and brain function. These hormones also play a role in keeping your bones strong. When the thyroid is underactive, it produces too little of the hormones thyroxine (T4) and triiodothyronine (T3). What are the causes? This condition may be caused by:  Hashimoto's disease. This is a disease in which the body's disease-fighting system (immune system) attacks the thyroid gland. This is the most common cause.  Viral infections.  Pregnancy.  Certain medicines.  Birth defects.  Past radiation treatments to the head or neck for cancer.  Past treatment with radioactive iodine.  Past exposure to radiation in the environment.  Past surgical removal of part or all of the thyroid.  Problems with a gland in the center of the brain (pituitary gland).  Lack of enough iodine in the diet. What increases the risk? You are more likely to develop this condition if:  You are female.  You have a family history of thyroid conditions.  You use a medicine called lithium.  You take medicines that affect the immune system (immunosuppressants). What are the signs or symptoms? Symptoms of this condition include:  Feeling as though you have no energy (lethargy).  Not being able to tolerate cold.  Weight gain that is not explained by a change in diet or exercise habits.  Lack of appetite.  Dry skin.  Coarse hair.  Menstrual irregularity.  Slowing of thought processes.  Constipation.  Sadness or depression. How is this diagnosed? This condition may be diagnosed based on:  Your symptoms, your medical history, and a physical exam.  Blood  tests. You may also have imaging tests, such as an ultrasound or MRI. How is this treated? This condition is treated with medicine that replaces the thyroid hormones that your body does not make. After you begin treatment, it may take several weeks for symptoms to go away. Follow these instructions at home:  Take over-the-counter and prescription medicines only as told by your health care provider.  If you start taking any new medicines, tell your health care provider.  Keep all follow-up visits as told by your health care provider. This is important. ? As your condition improves, your dosage of thyroid hormone medicine may change. ? You will need to have blood tests regularly so that your health care provider can monitor your condition. Contact a health care provider if:  Your symptoms do not get better with treatment.  You are taking thyroid hormone replacement medicine and you: ? Sweat a lot. ? Have tremors. ? Feel anxious. ? Lose weight rapidly. ? Cannot tolerate heat. ? Have emotional swings. ? Have diarrhea. ? Feel weak. Get help right away if you have:  Chest pain.  An irregular heartbeat.  A rapid heartbeat.  Difficulty breathing. Summary  Hypothyroidism is when the thyroid gland does not make enough of certain hormones (it is underactive).  When the thyroid is underactive, it produces too little of the hormones thyroxine (T4) and triiodothyronine (T3).  The most common cause is Hashimoto's disease, a disease in which the body's disease-fighting system (immune system) attacks the thyroid gland. The condition can also be caused by viral infections, medicine, pregnancy, or   past radiation treatment to the head or neck.  Symptoms may include weight gain, dry skin, constipation, feeling as though you do not have energy, and not being able to tolerate cold.  This condition is treated with medicine to replace the thyroid hormones that your body does not make. This  information is not intended to replace advice given to you by your health care provider. Make sure you discuss any questions you have with your health care provider. Document Revised: 08/15/2020 Document Reviewed: 07/31/2020 Elsevier Patient Education  2021 [employer] Inc.  

## 2021-03-30 NOTE — Progress Notes (Signed)
Subjective:  Patient ID: [name], female    DOB: [birth year]  Age: 70 y.o. MRN: [id number]  CC: Hypothyroidism and Hypertension  This visit occurred during the SARS-CoV-2 public health emergency.  Safety protocols were in place, including screening questions prior to the visit, additional usage of staff PPE, and extensive cleaning of exam room while observing appropriate contact time as indicated for disinfecting solutions.    HPI [name] presents for f/up -   She complains of weight gain.  She is active and denies any recent episodes of headache, blurred vision, chest pain, shortness of breath, palpitations, edema, or fatigue.  Outpatient Medications Prior to Visit  Medication Sig Dispense Refill  . amLODipine (NORVASC) 5 MG tablet Take 1 tablet (5 mg total) by mouth daily. 90 tablet 3  . carvedilol (COREG) 6.25 MG tablet Take 1 tablet (6.25 mg total) by mouth 2 (two) times daily with a meal. 180 tablet 3  . clopidogrel (PLAVIX) 75 MG tablet Take 1 tablet (75 mg total) by mouth every morning. 90 tablet 0  . famotidine (PEPCID) 40 MG tablet TAKE ONE TABLET BY MOUTH EVERY MORNING 90 tablet 1  . guaiFENesin (MUCINEX) 600 MG 12 hr tablet Take 2 tablets (1,200 mg total) by mouth 2 (two) times daily as needed. (Patient taking differently: Take 1,200 mg by mouth 2 (two) times daily as needed for to loosen phlegm.) 60 tablet 1  . isosorbide mononitrate (IMDUR) 30 MG 24 hr tablet Take 1 tablet (30 mg total) by mouth every morning. 90 tablet 0  . levocetirizine (XYZAL) 5 MG tablet Take 1 tablet (5 mg total) by mouth at bedtime. 90 tablet 1  . linaclotide (LINZESS) 145 MCG CAPS capsule Take 1 capsule (145 mcg total) by mouth daily before breakfast. (Patient taking differently: Take 145 mcg by mouth daily as needed (Constipation).) 90 capsule 1  . losartan (COZAAR) 25 MG tablet Take 1 tablet (25 mg total) by mouth every morning. 90 tablet 0  . magnesium oxide (MAG-OX) 400 MG tablet Take 400  mg by mouth daily.    . nitroGLYCERIN (NITROSTAT) 0.4 MG SL tablet Place 1 tablet (0.4 mg total) under the tongue every 5 (five) minutes as needed for chest pain. 25 tablet 6  . pantoprazole (PROTONIX) 20 MG tablet TAKE ONE TABLET BY MOUTH EVERY MORNING 90 tablet 1  . rosuvastatin (CRESTOR) 10 MG tablet TAKE ONE TABLET BY MOUTH EVERYDAY AT BEDTIME 90 tablet 0  . Vitamin D, Ergocalciferol, (DRISDOL) 1.25 MG (50000 UNIT) CAPS capsule TAKE ONE CAPSULE BY MOUTH ONCE A WEEK ON SATURDAY 12 capsule 0  . levothyroxine (SYNTHROID) 88 MCG tablet TAKE ONE TABLET BY MOUTH BEFORE BREAKFAST 90 tablet 0  . Na Sulfate-K Sulfate-Mg Sulf (SUPREP BOWEL PREP KIT PO) Take 1 kit by mouth once. Colonoscopy bowel prep  ( already had kit from prev. Cancelled colon)     . potassium chloride SA (KLOR-CON) 20 MEQ tablet Take 1 tablet (20 mEq total) by mouth 2 (two) times daily. 180 tablet 1   No facility-administered medications prior to visit.    ROS Review of Systems  Constitutional: Positive for unexpected weight change (wt gain). Negative for appetite change, chills, diaphoresis and fatigue.  HENT: Negative.   Eyes: Negative.   Respiratory: Negative for cough, chest tightness, shortness of breath and wheezing.   Cardiovascular: Negative for chest pain, palpitations and leg swelling.  Gastrointestinal: Negative for abdominal pain, constipation, diarrhea, nausea and vomiting.  Endocrine: Negative.  Genitourinary: Negative.  Negative for difficulty urinating.  Musculoskeletal: Negative for myalgias.  Skin: Negative.   Neurological: Negative.  Negative for dizziness, weakness and light-headedness.  Hematological: Negative for adenopathy. Does not bruise/bleed easily.  Psychiatric/Behavioral: Negative.     Objective:  BP 132/70 (BP Location: Left Arm, Patient Position: Sitting, Cuff Size: Large)   Pulse 64   Temp 97.8 F (36.6 C) (Oral)   Ht 5' 7"  (1.702 m)   Wt 181 lb (82.1 kg)   SpO2 99%   BMI 28.35 kg/m    BP Readings from Last 3 Encounters:  03/31/21 102/66  03/30/21 132/70  12/31/20 122/78    Wt Readings from Last 3 Encounters:  03/31/21 184 lb (83.5 kg)  03/30/21 181 lb (82.1 kg)  12/31/20 177 lb (80.3 kg)    Physical Exam Vitals reviewed.  HENT:     Nose: Nose normal.  Eyes:     General: No scleral icterus.    Conjunctiva/sclera: Conjunctivae normal.  Cardiovascular:     Rate and Rhythm: Normal rate and regular rhythm.     Heart sounds: No murmur heard.   Pulmonary:     Effort: Pulmonary effort is normal.     Breath sounds: No stridor. No wheezing, rhonchi or rales.  Abdominal:     General: Abdomen is flat.     Palpations: There is no mass.     Tenderness: There is no abdominal tenderness. There is no guarding.  Musculoskeletal:        General: Normal range of motion.     Cervical back: Neck supple.     Right lower leg: No edema.     Left lower leg: No edema.  Lymphadenopathy:     Cervical: No cervical adenopathy.  Skin:    General: Skin is warm and dry.  Neurological:     General: No focal deficit present.     Lab Results  Component Value Date   WBC 7.0 12/31/2020   HGB 12.8 12/31/2020   HCT 38.6 12/31/2020   PLT 264.0 12/31/2020   GLUCOSE 96 03/30/2021   CHOL 137 06/30/2020   TRIG 140 06/30/2020   HDL 52 06/30/2020   LDLDIRECT 88.0 10/19/2016   LDLCALC 63 06/30/2020   ALT 9 06/30/2020   AST 15 06/30/2020   NA 142 03/30/2021   K 4.5 03/30/2021   CL 104 03/30/2021   CREATININE 0.92 03/30/2021   BUN 11 03/30/2021   CO2 28 03/30/2021   TSH 5.30 (H) 03/30/2021   INR 1.01 05/08/2018   HGBA1C 6.3 12/31/2020    CT ABDOMEN PELVIS WO CONTRAST  Result Date: 10/28/2020 CLINICAL DATA:  Abdominal pain, hernia suspected. EXAM: CT ABDOMEN AND PELVIS WITHOUT CONTRAST TECHNIQUE: Multidetector CT imaging of the abdomen and pelvis was performed following the standard protocol without IV contrast. COMPARISON:  None. FINDINGS: Lower chest: Lung bases are  clear. Hepatobiliary: No focal hepatic lesion. Postcholecystectomy. No biliary dilatation. Pancreas: Pancreas is normal. No ductal dilatation. No pancreatic inflammation. Spleen: Normal spleen Adrenals/urinary tract: Adrenal glands and kidneys are normal. The ureters and bladder normal. Stomach/Bowel: Stomach, small bowel, appendix, and cecum are normal. The colon and rectosigmoid colon are normal. Vascular/Lymphatic: Abdominal aorta is normal caliber. No periportal or retroperitoneal adenopathy. No pelvic adenopathy. Reproductive: Post hysterectomy.  Adnexa unremarkable Other: No free fluid.  No inguinal hernia.  No ventral hernia. Musculoskeletal: No aggressive osseous lesion. IMPRESSION: 1. No acute findings in the abdomen pelvis. 2. No hernia. 3. Postcholecystectomy and hysterectomy. Electronically Signed  By: Suzy Bouchard M.D.   On: 10/28/2020 09:18    Assessment & Plan:   Meara was seen today for hypothyroidism and hypertension.  Diagnoses and all orders for this visit:  Essential hypertension- Her blood pressure is adequately well controlled. -     VITAMIN D 25 Hydroxy (Vit-D Deficiency, Fractures); Future -     Basic metabolic panel; Future -     TSH; Future -     TSH -     Basic metabolic panel -     VITAMIN D 25 Hydroxy (Vit-D Deficiency, Fractures)  Acquired hypothyroidism- Her TSH is elevated and she is symptomatic.  I recommended that she increase the dose of levothyroxine. -     TSH; Future -     TSH  Osteopenia, unspecified location -     VITAMIN D 25 Hydroxy (Vit-D Deficiency, Fractures); Future -     VITAMIN D 25 Hydroxy (Vit-D Deficiency, Fractures)   I have discontinued Kerin Ransom. Beckel's Na Sulfate-K Sulfate-Mg Sulf (SUPREP BOWEL PREP KIT PO), potassium chloride SA, and levothyroxine. I am also having her start on levothyroxine. Additionally, I am having her maintain her linaclotide, nitroGLYCERIN, amLODipine, carvedilol, Mucinex, Vitamin D (Ergocalciferol), magnesium  oxide, levocetirizine, rosuvastatin, clopidogrel, famotidine, losartan, pantoprazole, and isosorbide mononitrate.  Meds ordered this encounter  Medications  . levothyroxine (SYNTHROID) 100 MCG tablet    Sig: Take 1 tablet (100 mcg total) by mouth daily.    Dispense:  90 tablet    Refill:  1     Follow-up: Return in about 6 months (around 09/30/2021).  [name], MD

## 2021-03-31 ENCOUNTER — Encounter: Payer: Self-pay | Admitting: Internal Medicine

## 2021-03-31 ENCOUNTER — Ambulatory Visit (INDEPENDENT_AMBULATORY_CARE_PROVIDER_SITE_OTHER): Payer: Medicare Other | Admitting: Internal Medicine

## 2021-03-31 VITALS — BP 102/66 | HR 70 | Ht 67.0 in | Wt 184.0 lb

## 2021-03-31 DIAGNOSIS — K5909 Other constipation: Secondary | ICD-10-CM

## 2021-03-31 DIAGNOSIS — Q438 Other specified congenital malformations of intestine: Secondary | ICD-10-CM

## 2021-03-31 DIAGNOSIS — Z86010 Personal history of colonic polyps: Secondary | ICD-10-CM | POA: Diagnosis not present

## 2021-03-31 DIAGNOSIS — K219 Gastro-esophageal reflux disease without esophagitis: Secondary | ICD-10-CM

## 2021-03-31 NOTE — Progress Notes (Signed)
Subjective:    Patient ID: [name], female    DOB: 1951/05/28, 70 y.o.   MRN: [id number]  HPI [name] is a 70 year old female with history of adenomatous colon polyps, constipation, difficult colonoscopy due to extrinsic stenosis likely related to abdominal adhesive disease after prior hysterectomy complicated by bladder injury, hemorrhoids, GERD who is seen for follow-up.  She is here alone today.  She also has a history of hypothyroidism, hyperlipidemia.  She was seen by me for surveillance colonoscopy in September 2021 but this was incomplete due to a very redundant left colon.  I asked Dr. Rush Landmark to perform an outpatient hospital-based colonoscopy which he did on 10/06/2020.  This revealed severe narrowing in the descending, sigmoid and rectosigmoid colon.  Water immersion and abdominal binder was used to successfully reach the cecum.  3 polyps were removed ranging from 3 to 10 mm in size.  These were resected.  There were internal hemorrhoids but the mucosa was otherwise normal.  Polyps were found to be adenomatous.  She reports that she has been doing quite well.  Since her colonoscopy and actually since she had COVID-19 she has had less issues with constipation.  She was trying fiber but this seemed to make her have more abdominal bloating.  Not specifically make her constipation return.  Otherwise she reports her bowel habits have been more regular than they have been in quite some time.  If she does develop constipation she will use Linzess 145 mcg daily.  Her reflux is well controlled with low-dose pantoprazole 20 mg in the morning and occasional famotidine in the evening.  No dysphagia or odontophagia.  No abdominal pain.  Good appetite.  No increasing abdominal girth, lower extremity edema.   Review of Systems As per HPI, otherwise negative  Current Medications, Allergies, Past Medical History, Past Surgical History, Family History and Social History were reviewed in  [employer] record.     Objective:   Physical Exam BP 102/66 (BP Location: Left Arm, Patient Position: Sitting, Cuff Size: Normal)   Pulse 70   Ht 5' 7"  (1.702 m)   Wt 184 lb (83.5 kg)   BMI 28.82 kg/m  Gen: awake, alert, NAD HEENT: anicteric  CV: RRR, no mrg Pulm: CTA b/l Abd: soft, NT/ND, +BS throughout Ext: no c/c/e Neuro: nonfocal   CMP     Component Value Date/Time   NA 142 03/30/2021 0833   K 4.5 03/30/2021 0833   CL 104 03/30/2021 0833   CO2 28 03/30/2021 0833   GLUCOSE 96 03/30/2021 0833   BUN 11 03/30/2021 0833   CREATININE 0.92 03/30/2021 0833   CREATININE 0.82 06/30/2020 0843   CALCIUM 9.6 03/30/2021 0833   PROT 7.1 06/30/2020 0843   ALBUMIN 4.2 08/13/2019 1611   AST 15 06/30/2020 0843   ALT 9 06/30/2020 0843   ALKPHOS 87 08/13/2019 1611   BILITOT 1.3 (H) 06/30/2020 0843   GFRNONAA 73 06/30/2020 0843   GFRAA 85 06/30/2020 0843   CBC    Component Value Date/Time   WBC 7.0 12/31/2020 0825   RBC 4.80 12/31/2020 0825   HGB 12.8 12/31/2020 0825   HGB 12.5 05/16/2018 1206   HCT 38.6 12/31/2020 0825   HCT 36.8 05/16/2018 1206   PLT 264.0 12/31/2020 0825   PLT 383 05/16/2018 1206   MCV 80.3 12/31/2020 0825   MCV 82 05/16/2018 1206   MCH 27.6 12/02/2019 2113   MCHC 33.2 12/31/2020 0825   RDW 13.8 12/31/2020  0825   RDW 14.1 05/16/2018 1206   LYMPHSABS 2.6 12/31/2020 0825   MONOABS 0.5 12/31/2020 0825   EOSABS 0.2 12/31/2020 0825   BASOSABS 0.0 12/31/2020 0825          Assessment & Plan:  70 year old female with history of adenomatous colon polyps, constipation, difficult colonoscopy due to extrinsic stenosis likely related to abdominal adhesive disease after prior hysterectomy complicated by bladder injury, hemorrhoids, GERD who is seen for follow-up.  1.  History of adenomatous colon polyp/tortuous colon --it is likely that she has abdominal adhesions/scar tissue which contributes to both her constipation history and her  difficult colonoscopy.  CT scan of the abdomen pelvis without contrast was performed after her colonoscopy later in November 2021 which was unremarkable.  There were no hernias seen. -- Surveillance colonoscopy recommended in 3 years -- We will likely need to take place in the outpatient hospital setting with water immersion and abdominal binder with either me or Dr. Rush Landmark  2.  GERD --well-controlled on low-dose pantoprazole and occasional famotidine -- Continue pantoprazole 40 mg each morning -- Famotidine nightly as needed  3.  Chronic constipation --she reports actually better since her colonoscopy and having had COVID-19.  She can continue Linzess 145 mcg daily as needed  She can follow-up as needed  20 minutes total spent today including patient facing time, coordination of care, reviewing medical history/procedures/pertinent radiology studies, and documentation of the encounter.

## 2021-03-31 NOTE — Patient Instructions (Signed)
Please follow up with Dr Hilarie Fredrickson as needed.  Continue pantoprazole every morning.  Continue famotidine every evening.  Continue Linzess as needed.  If you are age 70 or older, your body mass index should be between 23-30. Your Body mass index is 28.82 kg/m. If this is out of the aforementioned range listed, please consider follow up with your Primary Care Provider.  Due to recent changes in healthcare laws, you may see the results of your imaging and laboratory studies on MyChart before your provider has had a chance to review them.  We understand that in some cases there may be results that are confusing or concerning to you. Not all laboratory results come back in the same time frame and the provider may be waiting for multiple results in order to interpret others.  Please give [place] 48 hours in order for your provider to thoroughly review all the results before contacting the office for clarification of your results.

## 2021-05-08 ENCOUNTER — Other Ambulatory Visit: Payer: Self-pay | Admitting: Internal Medicine

## 2021-05-08 ENCOUNTER — Other Ambulatory Visit: Payer: Self-pay | Admitting: Cardiovascular Disease

## 2021-05-08 DIAGNOSIS — J302 Other seasonal allergic rhinitis: Secondary | ICD-10-CM

## 2021-05-11 ENCOUNTER — Other Ambulatory Visit: Payer: Self-pay | Admitting: Cardiovascular Disease

## 2021-05-11 ENCOUNTER — Telehealth: Payer: Self-pay

## 2021-05-11 NOTE — Telephone Encounter (Signed)
Refills

## 2021-05-13 ENCOUNTER — Telehealth: Payer: Self-pay | Admitting: Pharmacist

## 2021-05-13 NOTE — Progress Notes (Addendum)
Chronic Care Management Pharmacy Assistant   Name: PRESLY STEINRUCK  MRN: [id number] DOB: February 19, 1951   Reason for Encounter: Medication Review   Recent office visits:  03/30/21 Dr. [name] increased levothyroxine to 100 mcg and d/c potassium   Recent consult visits:  03/31/21 Dr. Norvel Richards, no medication changes  Hospital visits:  None in previous 6 months  Medications: Outpatient Encounter Medications as of 05/13/2021  Medication Sig   amLODipine (NORVASC) 5 MG tablet TAKE ONE TABLET BY MOUTH EVERY MORNING   carvedilol (COREG) 6.25 MG tablet TAKE ONE TABLET BY MOUTH AT BREAKFAST AND AT BEDTIME   clopidogrel (PLAVIX) 75 MG tablet TAKE ONE TABLET BY MOUTH EVERY MORNING   famotidine (PEPCID) 40 MG tablet TAKE ONE TABLET BY MOUTH EVERY MORNING   guaiFENesin (MUCINEX) 600 MG 12 hr tablet Take 2 tablets (1,200 mg total) by mouth 2 (two) times daily as needed. (Patient taking differently: Take 1,200 mg by mouth 2 (two) times daily as needed for to loosen phlegm.)   isosorbide mononitrate (IMDUR) 30 MG 24 hr tablet TAKE ONE TABLET BY MOUTH EVERY MORNING   levocetirizine (XYZAL) 5 MG tablet TAKE ONE TABLET BY MOUTH EVERYDAY AT BEDTIME   levothyroxine (SYNTHROID) 100 MCG tablet Take 1 tablet (100 mcg total) by mouth daily.   linaclotide (LINZESS) 145 MCG CAPS capsule Take 1 capsule (145 mcg total) by mouth daily before breakfast. (Patient taking differently: Take 145 mcg by mouth daily as needed (Constipation).)   losartan (COZAAR) 25 MG tablet TAKE ONE TABLET BY MOUTH EVERY MORNING   magnesium oxide (MAG-OX) 400 MG tablet Take 400 mg by mouth daily.   nitroGLYCERIN (NITROSTAT) 0.4 MG SL tablet Place 1 tablet (0.4 mg total) under the tongue every 5 (five) minutes as needed for chest pain.   pantoprazole (PROTONIX) 20 MG tablet TAKE ONE TABLET BY MOUTH EVERY MORNING   rosuvastatin (CRESTOR) 10 MG tablet TAKE ONE TABLET BY MOUTH EVERYDAY AT BEDTIME   Vitamin D, Ergocalciferol, (DRISDOL) 1.25  MG (50000 UNIT) CAPS capsule TAKE ONE CAPSULE BY MOUTH ONCE A WEEK ON SATURDAY   No facility-administered encounter medications on file as of 05/13/2021.    Pharmacist Review Reviewed chart for medication changes ahead of medication coordination call.  No OVs, Consults, or hospital visits since last care coordination call/Pharmacist visit. (If appropriate, list visit date, provider name)  No medication changes indicated OR if recent visit, treatment plan here.  BP Readings from Last 3 Encounters:  03/31/21 102/66  03/30/21 132/70  12/31/20 122/78    Lab Results  Component Value Date   HGBA1C 6.3 12/31/2020     Patient obtains medications through [facility]  90 Days   Patient is due for next adherence delivery on: 05/21/21. Called patient and reviewed medications and coordinated delivery.  This delivery to include:     Amlodipine    5 mg   Carvedilol    6.25 mg   Clopidogrel    75 mg   Famotidine    40 mg   Isosorbide ER    30 mg   Losartan    25 mg   Pantoprazole    20 mg  Potassium ER    20 meq  Levocetirizine    5 mg   Rosuvastatin    10 mg   Vitamin D2   50,000 u    Patient needs refills for rosuvastatin, losartan, isosorbide and clopidogrel Dr. [name].  Patient declined levothyroxine  Confirmed delivery date of 05/21/21,  advised patient that pharmacy will contact them the morning of delivery.     [place] Pharmacist Assistant [phone]   Time spent:40

## 2021-05-20 DIAGNOSIS — R768 Other specified abnormal immunological findings in serum: Secondary | ICD-10-CM | POA: Diagnosis not present

## 2021-05-20 DIAGNOSIS — M7989 Other specified soft tissue disorders: Secondary | ICD-10-CM | POA: Diagnosis not present

## 2021-05-20 DIAGNOSIS — M150 Primary generalized (osteo)arthritis: Secondary | ICD-10-CM | POA: Diagnosis not present

## 2021-06-11 ENCOUNTER — Ambulatory Visit (INDEPENDENT_AMBULATORY_CARE_PROVIDER_SITE_OTHER): Payer: Medicare Other | Admitting: Internal Medicine

## 2021-06-11 ENCOUNTER — Other Ambulatory Visit: Payer: Self-pay

## 2021-06-11 ENCOUNTER — Encounter: Payer: Self-pay | Admitting: Internal Medicine

## 2021-06-11 VITALS — BP 128/82 | HR 67 | Temp 98.5°F | Resp 16 | Ht 67.0 in | Wt 184.0 lb

## 2021-06-11 DIAGNOSIS — E039 Hypothyroidism, unspecified: Secondary | ICD-10-CM | POA: Diagnosis not present

## 2021-06-11 DIAGNOSIS — I10 Essential (primary) hypertension: Secondary | ICD-10-CM

## 2021-06-11 DIAGNOSIS — R7303 Prediabetes: Secondary | ICD-10-CM | POA: Diagnosis not present

## 2021-06-11 LAB — BASIC METABOLIC PANEL
BUN: 7 mg/dL (ref 6–23)
CO2: 29 mEq/L (ref 19–32)
Calcium: 9.3 mg/dL (ref 8.4–10.5)
Chloride: 104 mEq/L (ref 96–112)
Creatinine, Ser: 0.81 mg/dL (ref 0.40–1.20)
GFR: 73.74 mL/min (ref >60.00)
Glucose, Bld: 103 mg/dL — ABNORMAL HIGH (ref 70–99)
Potassium: 4.1 mEq/L (ref 3.5–5.1)
Sodium: 141 mEq/L (ref 135–145)

## 2021-06-11 LAB — TSH: TSH: 2 u[IU]/mL (ref 0.35–5.50)

## 2021-06-11 LAB — HEMOGLOBIN A1C: Hgb A1c MFr Bld: 6.2 % (ref 4.6–6.5)

## 2021-06-11 NOTE — Patient Instructions (Signed)

## 2021-06-11 NOTE — Progress Notes (Signed)
Subjective:  Patient ID: [name], female    DOB: November 12, 1951  Age: 70 y.o. MRN: [id number]  CC: Hypertension and Hypothyroidism  This visit occurred during the SARS-CoV-2 public health emergency.  Safety protocols were in place, including screening questions prior to the visit, additional usage of staff PPE, and extensive cleaning of exam room while observing appropriate contact time as indicated for disinfecting solutions.    HPI [name] presents for f/up -  She reports that her blood pressure has been well controlled.  She is active and denies any recent episodes of chest pain, shortness of breath, diaphoresis, edema, dizziness, lightheadedness, or palpitations.  Outpatient Medications Prior to Visit  Medication Sig Dispense Refill   amLODipine (NORVASC) 5 MG tablet TAKE ONE TABLET BY MOUTH EVERY MORNING 90 tablet 0   carvedilol (COREG) 6.25 MG tablet TAKE ONE TABLET BY MOUTH AT BREAKFAST AND AT BEDTIME 180 tablet 0   clopidogrel (PLAVIX) 75 MG tablet TAKE ONE TABLET BY MOUTH EVERY MORNING 90 tablet 0   famotidine (PEPCID) 40 MG tablet TAKE ONE TABLET BY MOUTH EVERY MORNING 90 tablet 1   isosorbide mononitrate (IMDUR) 30 MG 24 hr tablet TAKE ONE TABLET BY MOUTH EVERY MORNING 90 tablet 0   levocetirizine (XYZAL) 5 MG tablet TAKE ONE TABLET BY MOUTH EVERYDAY AT BEDTIME 90 tablet 1   levothyroxine (SYNTHROID) 100 MCG tablet Take 1 tablet (100 mcg total) by mouth daily. 90 tablet 1   linaclotide (LINZESS) 145 MCG CAPS capsule Take 1 capsule (145 mcg total) by mouth daily before breakfast. (Patient taking differently: Take 145 mcg by mouth daily as needed (Constipation).) 90 capsule 1   losartan (COZAAR) 25 MG tablet TAKE ONE TABLET BY MOUTH EVERY MORNING 90 tablet 0   magnesium oxide (MAG-OX) 400 MG tablet Take 400 mg by mouth daily.     nitroGLYCERIN (NITROSTAT) 0.4 MG SL tablet Place 1 tablet (0.4 mg total) under the tongue every 5 (five) minutes as needed for chest pain. 25 tablet  6   pantoprazole (PROTONIX) 20 MG tablet TAKE ONE TABLET BY MOUTH EVERY MORNING 90 tablet 1   rosuvastatin (CRESTOR) 10 MG tablet TAKE ONE TABLET BY MOUTH EVERYDAY AT BEDTIME 90 tablet 0   Vitamin D, Ergocalciferol, (DRISDOL) 1.25 MG (50000 UNIT) CAPS capsule TAKE ONE CAPSULE BY MOUTH ONCE A WEEK ON SATURDAY 12 capsule 0   guaiFENesin (MUCINEX) 600 MG 12 hr tablet Take 2 tablets (1,200 mg total) by mouth 2 (two) times daily as needed. (Patient taking differently: Take 1,200 mg by mouth 2 (two) times daily as needed for to loosen phlegm.) 60 tablet 1   No facility-administered medications prior to visit.    ROS Review of Systems  Constitutional:  Negative for diaphoresis and fatigue.  HENT: Negative.    Eyes: Negative.   Respiratory:  Negative for cough and shortness of breath.   Cardiovascular:  Negative for chest pain, palpitations and leg swelling.  Gastrointestinal:  Negative for abdominal pain, constipation, diarrhea, nausea and vomiting.  Endocrine: Negative.  Negative for cold intolerance and heat intolerance.  Genitourinary: Negative.  Negative for difficulty urinating.  Musculoskeletal: Negative.  Negative for arthralgias and myalgias.  Skin: Negative.   Neurological:  Negative for dizziness, weakness and light-headedness.  Hematological:  Negative for adenopathy. Does not bruise/bleed easily.  Psychiatric/Behavioral: Negative.     Objective:  BP 128/82 (BP Location: Left Arm, Patient Position: Sitting, Cuff Size: Large)   Pulse 67   Temp 98.5 F (36.9  C) (Oral)   Resp 16   Ht 5' 7"  (1.702 m)   Wt 184 lb (83.5 kg)   SpO2 97%   BMI 28.82 kg/m   BP Readings from Last 3 Encounters:  06/11/21 128/82  03/31/21 102/66  03/30/21 132/70    Wt Readings from Last 3 Encounters:  06/11/21 184 lb (83.5 kg)  03/31/21 184 lb (83.5 kg)  03/30/21 181 lb (82.1 kg)    Physical Exam Vitals reviewed.  Constitutional:      Appearance: Normal appearance.  HENT:      Mouth/Throat:     Mouth: Mucous membranes are moist.  Eyes:     Conjunctiva/sclera: Conjunctivae normal.  Cardiovascular:     Rate and Rhythm: Normal rate and regular rhythm.     Heart sounds: No murmur heard. Pulmonary:     Effort: Pulmonary effort is normal.     Breath sounds: No stridor. No wheezing, rhonchi or rales.  Abdominal:     General: Abdomen is flat. Bowel sounds are normal. There is no distension.     Palpations: Abdomen is soft. There is no hepatomegaly or mass.     Tenderness: There is no abdominal tenderness.  Musculoskeletal:        General: Normal range of motion.     Cervical back: Neck supple.     Right lower leg: No edema.     Left lower leg: No edema.  Lymphadenopathy:     Cervical: No cervical adenopathy.  Skin:    General: Skin is warm and dry.  Neurological:     General: No focal deficit present.     Mental Status: She is alert.    Lab Results  Component Value Date   WBC 7.0 12/31/2020   HGB 12.8 12/31/2020   HCT 38.6 12/31/2020   PLT 264.0 12/31/2020   GLUCOSE 103 (H) 06/11/2021   CHOL 137 06/30/2020   TRIG 140 06/30/2020   HDL 52 06/30/2020   LDLDIRECT 88.0 10/19/2016   LDLCALC 63 06/30/2020   ALT 9 06/30/2020   AST 15 06/30/2020   NA 141 06/11/2021   K 4.1 06/11/2021   CL 104 06/11/2021   CREATININE 0.81 06/11/2021   BUN 7 06/11/2021   CO2 29 06/11/2021   TSH 2.00 06/11/2021   INR 1.01 05/08/2018   HGBA1C 6.2 06/11/2021    CT ABDOMEN PELVIS WO CONTRAST  Result Date: 10/28/2020 CLINICAL DATA:  Abdominal pain, hernia suspected. EXAM: CT ABDOMEN AND PELVIS WITHOUT CONTRAST TECHNIQUE: Multidetector CT imaging of the abdomen and pelvis was performed following the standard protocol without IV contrast. COMPARISON:  None. FINDINGS: Lower chest: Lung bases are clear. Hepatobiliary: No focal hepatic lesion. Postcholecystectomy. No biliary dilatation. Pancreas: Pancreas is normal. No ductal dilatation. No pancreatic inflammation. Spleen:  Normal spleen Adrenals/urinary tract: Adrenal glands and kidneys are normal. The ureters and bladder normal. Stomach/Bowel: Stomach, small bowel, appendix, and cecum are normal. The colon and rectosigmoid colon are normal. Vascular/Lymphatic: Abdominal aorta is normal caliber. No periportal or retroperitoneal adenopathy. No pelvic adenopathy. Reproductive: Post hysterectomy.  Adnexa unremarkable Other: No free fluid.  No inguinal hernia.  No ventral hernia. Musculoskeletal: No aggressive osseous lesion. IMPRESSION: 1. No acute findings in the abdomen pelvis. 2. No hernia. 3. Postcholecystectomy and hysterectomy. Electronically Signed   By: Suzy Bouchard M.D.   On: 10/28/2020 09:18    Assessment & Plan:   Dalexa was seen today for hypertension and hypothyroidism.  Diagnoses and all orders for this visit:  Acquired hypothyroidism-  Her TSH is in the normal range.  She will stay on the current dose of levothyroxine. -     TSH; Future -     TSH  Prediabetes- Her A1c is at 6.2%.  Medical therapy is not indicated at this time. -     Basic metabolic panel; Future -     Hemoglobin A1c; Future -     Hemoglobin A1c -     Basic metabolic panel  Essential hypertension- Her blood pressure is well controlled.  Electrolytes and renal function are normal. -     Basic metabolic panel; Future -     TSH; Future -     TSH -     Basic metabolic panel  I have discontinued Kerin Ransom. Faeth's Mucinex. I am also having her maintain her linaclotide, nitroGLYCERIN, Vitamin D (Ergocalciferol), magnesium oxide, famotidine, pantoprazole, levothyroxine, levocetirizine, carvedilol, amLODipine, isosorbide mononitrate, losartan, clopidogrel, and rosuvastatin.  No orders of the defined types were placed in this encounter.    Follow-up: Return in about 6 months (around 12/12/2021).  [name], MD

## 2021-06-12 ENCOUNTER — Telehealth: Payer: Self-pay

## 2021-06-12 NOTE — Telephone Encounter (Signed)
  Care Management  Care Management Phone Note  06/12/2021 Name: [name] MRN: [id number] DOB: May 29, 1951 [name] is a 70 y.o. year old female who is a primary care patient of [name], MD.   The Care Management team was consulted to assess patient's needs after disconnection with services provided by [facility].  Unsuccessful outreach was made by telephone today to assess patient care needs post discontinuation of [facility].    Plan: The care management team will attempt to reach patient again to assess care needs post [facility] discontinuation.   Tomasa Rand, RN, BSN, CEN [facility] [employer] [phone]

## 2021-06-15 ENCOUNTER — Telehealth: Payer: Self-pay

## 2021-06-15 NOTE — Telephone Encounter (Signed)
  Care Management  Care Management Phone Note  06/15/2021 Name: KARITA DRALLE MRN: [id number] DOB: 1951-09-27 LEDIA HANFORD is a 70 y.o. year old female who is a primary care patient of [name], MD.   The Care Management team was consulted to assess patient's needs after disconnection with services provided by [facility].  Second unsuccessful outreach was made by telephone today to assess patient care needs post discontinuation of [facility].    Plan: The care management team will attempt to reach patient again to assess care needs post [facility] discontinuation.    Tomasa Rand, RN, BSN, CEN [facility] [employer] [phone]

## 2021-06-19 ENCOUNTER — Telehealth: Payer: Self-pay

## 2021-06-19 ENCOUNTER — Telehealth: Payer: Medicare Other

## 2021-06-19 NOTE — Progress Notes (Deleted)
Chronic Care Management Pharmacy Note  06/19/2021 Name:  [name] MRN:  [id number] DOB:  15-Dec-1950  Summary: ***  Recommendations/Changes made from today's visit: ***  Plan: ***   Subjective: [name] is an 70 y.o. year old female who is a primary patient of Emily Lima, MD.  The CCM team was consulted for assistance with disease management and care coordination needs.    Engaged with patient by telephone for follow up visit in response to provider referral for pharmacy case management and/or care coordination services.   Consent to Services:  The patient was given information about Chronic Care Management services, agreed to services, and gave verbal consent prior to initiation of services.  Please see initial visit note for detailed documentation.   Patient Care Team: Emily Lima, MD as PCP - Emily Diver, MD as PCP - Cardiology (Cardiology) [name], [name] as Pharmacist (Pharmacist)  Recent office visits: 06/11/21 Dr [name] OV: chronic f/u; TSH normal. no med changes; RTC 6 months  03/30/21 Dr [name] OV: chronic f/u; TSH high, increased levothyroxine to 100 mcg  Recent consult visits: 03/31/21 Dr Hilarie Fredrickson (GI): f/u constipation, GERD. No changes. Follow up PRN  Hospital visits: None in previous 6 months   Objective:  Lab Results  Component Value Date   CREATININE 0.81 06/11/2021   BUN 7 06/11/2021   GFR 73.74 06/11/2021   GFRNONAA 73 06/30/2020   GFRAA 85 06/30/2020   NA 141 06/11/2021   K 4.1 06/11/2021   CALCIUM 9.3 06/11/2021   CO2 29 06/11/2021   GLUCOSE 103 (H) 06/11/2021    Lab Results  Component Value Date/Time   HGBA1C 6.2 06/11/2021 08:49 AM   HGBA1C 6.3 12/31/2020 08:25 AM   GFR 73.74 06/11/2021 08:49 AM   GFR 63.38 03/30/2021 08:33 AM    Last diabetic Eye exam:  Lab Results  Component Value Date/Time   HMDIABEYEEXA no DR 01/07/2012 12:00 AM    Last diabetic Foot exam:  Lab Results  Component Value  Date/Time   HMDIABFOOTEX normal 06/14/2012 12:00 AM     Lab Results  Component Value Date   CHOL 137 06/30/2020   HDL 52 06/30/2020   LDLCALC 63 06/30/2020   LDLDIRECT 88.0 10/19/2016   TRIG 140 06/30/2020   CHOLHDL 2.6 06/30/2020    Hepatic Function Latest Ref Rng & Units 06/30/2020 08/13/2019 05/21/2019  Total Protein 6.1 - 8.1 g/dL 7.1 7.7 7.2  Albumin 3.5 - 5.2 g/dL - 4.2 4.1  AST 10 - 35 U/L 15 27 44(H)  ALT 6 - 29 U/L 9 20 34  Alk Phosphatase 39 - 117 U/L - 87 87  Total Bilirubin 0.2 - 1.2 mg/dL 1.3(H) 1.1 1.6(H)  Bilirubin, Direct 0.0 - 0.2 mg/dL 0.3(H) 0.2 0.3    Lab Results  Component Value Date/Time   TSH 2.00 06/11/2021 08:49 AM   TSH 5.30 (H) 03/30/2021 08:33 AM    CBC Latest Ref Rng & Units 12/31/2020 12/02/2019 08/17/2019  WBC 4.0 - 10.5 K/uL 7.0 8.2 8.0  Hemoglobin 12.0 - 15.0 g/dL 12.8 13.8 13.0  Hematocrit 36.0 - 46.0 % 38.6 40.1 36.6  Platelets 150.0 - 400.0 K/uL 264.0 291 263    Lab Results  Component Value Date/Time   VD25OH 58.79 03/30/2021 08:33 AM   VD25OH 76 06/30/2020 08:43 AM   VD25OH 44.77 12/31/2019 08:54 AM    Clinical ASCVD: Yes  The ASCVD Risk score Mikey Bussing DC Jr., et al., 2013) failed to calculate  for the following reasons:   The patient has a prior MI or stroke diagnosis    Depression screen [name] 2/9 03/30/2021 05/21/2020 05/22/2019  Decreased Interest 0 0 0  Down, Depressed, Hopeless 0 0 0  PHQ - 2 Score 0 0 0  Altered sleeping - - -  Tired, decreased energy - - -  Change in appetite - - -  Feeling bad or failure about yourself  - - -  Trouble concentrating - - -  Moving slowly or fidgety/restless - - -  Suicidal thoughts - - -  PHQ-9 Score - - -  Difficult doing work/chores - - -  Some recent data might be hidden     Social History   Tobacco Use  Smoking Status Never  Smokeless Tobacco Never   BP Readings from Last 3 Encounters:  06/11/21 128/82  03/31/21 102/66  03/30/21 132/70   Pulse Readings from Last 3 Encounters:   06/11/21 67  03/31/21 70  03/30/21 64   Wt Readings from Last 3 Encounters:  06/11/21 184 lb (83.5 kg)  03/31/21 184 lb (83.5 kg)  03/30/21 181 lb (82.1 kg)   BMI Readings from Last 3 Encounters:  06/11/21 28.82 kg/m  03/31/21 28.82 kg/m  03/30/21 28.35 kg/m    Assessment/Interventions: Review of patient past medical history, allergies, medications, health status, including review of consultants reports, laboratory and other test data, was performed as part of comprehensive evaluation and provision of chronic care management services.   SDOH:  (Social Determinants of Health) assessments and interventions performed: Yes  SDOH Screenings   Alcohol Screen: Not on file  Depression (PHQ2-9): Low Risk    PHQ-2 Score: 0  Financial Resource Strain: Not on file  Food Insecurity: Not on file  Housing: Not on file  Physical Activity: Not on file  Social Connections: Not on file  Stress: Not on file  Tobacco Use: Low Risk    Smoking Tobacco Use: Never   Smokeless Tobacco Use: Never  Transportation Needs: Not on file    CCM Care Plan  Allergies  Allergen Reactions   Aspirin Rash    unknown   Gadolinium Hives    patient unsure of which kind of dye she had a reaction to until reaction to Magnevist - may be allergic to x-ray contrast, Onset Date: [id number]    Iohexol Hives    patient unsure of which kind of dye she had a reaction to until reaction to Magnevist - may be allergic to x-ray contrast, Onset Date: 14970263   Livalo [Pitavastatin] Other (See Comments)    myalgias   Hydrocodone-Acetaminophen Rash   Lipitor [Atorvastatin] Other (See Comments)    Myalgias    Medications Reviewed Today     Reviewed by Emily Lima, MD (Physician) on 06/11/21 at [place] List Status: <None>   Medication Order Taking? Sig Documenting Provider Last Dose Status Informant  amLODipine (NORVASC) 5 MG tablet [id number] Yes TAKE ONE TABLET BY MOUTH EVERY Ellan Lambert, MD  Taking Active   carvedilol (COREG) 6.25 MG tablet [id number] Yes TAKE ONE TABLET BY MOUTH AT BREAKFAST AND AT BEDTIME [name], MD Taking Active   clopidogrel (PLAVIX) 75 MG tablet [id number] Yes TAKE ONE TABLET BY MOUTH EVERY Ellan Lambert, MD Taking Active   famotidine (PEPCID) 40 MG tablet [id number] Yes TAKE ONE TABLET BY MOUTH EVERY MORNING Emily Lima, MD Taking Active   isosorbide mononitrate (IMDUR) 30 MG 24 hr tablet [id number] Yes TAKE ONE TABLET  BY MOUTH EVERY Ellan Lambert, MD Taking Active   levocetirizine (XYZAL) 5 MG tablet [id number] Yes TAKE ONE TABLET BY MOUTH EVERYDAY AT BEDTIME Emily Lima, MD Taking Active   levothyroxine (SYNTHROID) 100 MCG tablet [id number] Yes Take 1 tablet (100 mcg total) by mouth daily. Emily Lima, MD Taking Active   linaclotide [name]) 145 MCG CAPS capsule [id number] Yes Take 1 capsule (145 mcg total) by mouth daily before breakfast.  Patient taking differently: Take 145 mcg by mouth daily as needed (Constipation).   Emily Lima, MD Taking Active   losartan (COZAAR) 25 MG tablet [id number] Yes TAKE ONE TABLET BY MOUTH EVERY Ellan Lambert, MD Taking Active   magnesium oxide (MAG-OX) 400 MG tablet [id number] Yes Take 400 mg by mouth daily. [provider] Taking Active Self  nitroGLYCERIN (NITROSTAT) 0.4 MG SL tablet [id number] Yes Place 1 tablet (0.4 mg total) under the tongue every 5 (five) minutes as needed for chest pain. [name], MD Taking Active Self  pantoprazole (PROTONIX) 20 MG tablet [id number] Yes TAKE ONE TABLET BY MOUTH EVERY MORNING Emily Lima, MD Taking Active   rosuvastatin (CRESTOR) 10 MG tablet [id number] Yes TAKE ONE TABLET BY MOUTH EVERYDAY AT BEDTIME [name], MD Taking Active   Vitamin D, Ergocalciferol, (DRISDOL) 1.25 MG (50000 UNIT) CAPS capsule [id number] Yes TAKE ONE CAPSULE BY MOUTH ONCE A WEEK ON Ronny Flurry, MD Taking Active              Patient Active Problem List   Diagnosis Date Noted   Hirsutism 05/21/2020   Balding 05/21/2020   Allergic rhinitis 04/12/2020   Takotsubo cardiomyopathy 02/26/2019   NASH (nonalcoholic steatohepatitis) 01/22/2019   Chronic idiopathic constipation 01/12/2019   Arthritis of carpometacarpal (CMC) joint of left thumb 09/25/2018   Degenerative arthritis of left knee 05/24/2018   CAD (coronary artery disease) 05/16/2018   Vitamin D deficiency 11/11/2017   Degenerative disc disease, lumbar 08/29/2017   Prediabetes 06/14/2016   Obesity (BMI 30.0-34.9) 06/14/2016   Routine general medical examination at a health care facility 12/31/2014   Osteopenia 07/20/2013   GERD (gastroesophageal reflux disease) 09/16/2011   Generalized anxiety disorder 09/16/2011   Constipation, slow transit 09/16/2011   Personal history of colonic polyps 08/17/2011   DJD (degenerative joint disease) of knee 06/25/2011   Pure hyperglyceridemia 06/24/2011   Hyperlipidemia LDL goal <70 07/30/2009   Hypothyroidism 04/25/2009   Essential hypertension 04/25/2009   FIBROMYALGIA 04/25/2009    Immunization History  Administered Date(s) Administered   Fluad Quad(high Dose 65+) 08/13/2019, 12/31/2020   Influenza Split 11/15/2011   Influenza Whole 12/11/2009   Influenza, High Dose Seasonal PF 10/19/2016, 12/01/2017, 11/06/2018   Influenza,inj,Quad PF,6+ Mos 08/10/2013, 09/18/2015   PFIZER(Purple Top)SARS-COV-2 Vaccination 03/07/2020, 03/31/2020, 11/17/2020   Pneumococcal Conjugate-13 06/14/2016   Pneumococcal Polysaccharide-23 07/20/2013, 01/12/2019   Td 06/23/2010   Tdap 06/30/2020   Zoster, Live 11/06/2013    Conditions to be addressed/monitored:  Hypertension, Hyperlipidemia, and Coronary Artery Disease  There are no care plans that you recently modified to display for this patient.    Medication Assistance: None required.  Patient affirms current coverage meets needs.  Compliance/Adherence/Medication  fill history: Care Gaps: Shingrix Covid booster (due 03/18/21)  Star-Rating Drugs: Losartan - LF 05/18/21 x 90 ds Rosuvastatin - LF 05/18/21 x 90 ds  Patient's preferred pharmacy is:  [facility] [place], [place] - [address] [place] [address]  Phone: [phone] Fax: [phone]  We discussed: {Pharmacy options:24294} Patient decided to: {US Pharmacy Plan:23885}  Care Plan and Follow Up Patient Decision:  {FOLLOWUP:24991}  Plan: {CM FOLLOW UP UQJF:35456}  ***    Current Barriers:  {pharmacybarriers:24917}  Pharmacist Clinical Goal(s):  Patient will {PHARMACYGOALCHOICES:24921} through collaboration with PharmD and provider.   Interventions: 1:1 collaboration with Emily Lima, MD regarding development and update of comprehensive plan of care as evidenced by provider attestation and co-signature Inter-disciplinary care team collaboration (see longitudinal plan of care) Comprehensive medication review performed; medication list updated in electronic medical record   Hypertension    BP goal is:  <130/80 Patient checks BP at home daily Patient home BP readings are ranging: 127/75, 133/85, 128/74   Patient has failed these meds in the past: metoprolol, indapamide, olmesartan, HCTZ, Bystolic, valsartan Patient is currently controlled on the following medications: amlodipine 5 mg daily AM, carvedilol 6.25 mg BID, losartan 25 mg daily, isosorbide [place] 30 mg daily Potassium 20 mEq BID   We discussed: pt reports leg cramps have completely resolved; her BP at home is controlled; she denies issues or side effects with medications, no longer complaining of swelling.   Pt has new potassium supplement due to low K at PCP visit; discussed consequences of low potassium and goal range.   Plan: Continue current medications and control with diet and exercise    Hyperlipidemia / CAD    LDL goal < 70 Cath  05/08/2018: ACS c/w stress cardiomyopathy, largely patent vessels, no PCI, rec'd risk factor modification.    Patient has failed these meds in past: atorvastatin, pravastatin, pitavastatin Patient is currently controlled on the following medications: rosuvastatin 10 mg daily, clopidogrel 75 mg daily, nitroglycerin 0.4 mg PRN   We discussed:  Pt endorses compliance with medications and denies side effects   Plan: Continue current medications and control with diet and exercise    Hypothyroidism    Patient is currently controlled on the following medications: levothyroxine 100 mcg daily   We discussed: TSH is at goal, pt denies s/sx hypothyroidism; no changes indicated   Plan: Continue current medications    Health Maintenance     Patient is currently controlled on the following medications: Vitamin D2 50,000 IU weekly Sat meclizine 25 mg BID prn   We discussed:  Patient is satisfied with current OTC regimen and denies issues. Pt may be able to switch to maintenance dose of Vitamin D - 1000-2000 IU daily.   Plan Continue current medications  Consider switch to maintenance dose of Vitamin D 2000 IU daily   Patient Goals/Self-Care Activities Patient will:  - {pharmacypatientgoals:24919}

## 2021-06-19 NOTE — Telephone Encounter (Signed)
  Care Management  Care Management Phone Note  06/19/2021 Name: [name] MRN: [id number] DOB: 21-Sep-1951 [name] is a 70 y.o. year old female who is a primary care patient of [name], MD.   The Care Management team was consulted to assess patient's needs after disconnection with services provided by [facility].  Successful outreach was made by telephone today to assess patient care needs post discontinuation of [facility] Services.   Patient confirmed receipt of in home care service discontinuation letter: Yes  Plan: The patient denied current care management/care coordination needs and declined to enroll in care management/care coordination services. Please use [id number] referral order to refer the patient to the embedded care coordination team if the patient is in need of care management/care coordination needs in the future.    Tomasa Rand, RN, BSN, CEN [facility] [employer] [phone]

## 2021-07-06 ENCOUNTER — Other Ambulatory Visit: Payer: Self-pay

## 2021-07-06 ENCOUNTER — Ambulatory Visit (INDEPENDENT_AMBULATORY_CARE_PROVIDER_SITE_OTHER): Payer: Medicare Other | Admitting: Cardiovascular Disease

## 2021-07-06 ENCOUNTER — Encounter: Payer: Self-pay | Admitting: Cardiovascular Disease

## 2021-07-06 VITALS — BP 134/76 | HR 68 | Ht 67.0 in | Wt 185.8 lb

## 2021-07-06 DIAGNOSIS — I10 Essential (primary) hypertension: Secondary | ICD-10-CM

## 2021-07-06 DIAGNOSIS — I5181 Takotsubo syndrome: Secondary | ICD-10-CM | POA: Diagnosis not present

## 2021-07-06 NOTE — Patient Instructions (Signed)
Medication Instructions:  No changes *If you need a refill on your cardiac medications before your next appointment, please call your pharmacy*   Lab Work: none If you have labs (blood work) drawn today and your tests are completely normal, you will receive your results only by: [place] (if you have MyChart) OR A paper copy in the mail If you have any lab test that is abnormal or we need to change your treatment, we will call you to review the results.   Testing/Procedures: none   Follow-Up: At [facility], you and your health needs are our priority.  As part of our continuing mission to provide you with exceptional heart care, we have created designated Provider Care Teams.  These Care Teams include your primary Cardiologist (physician) and Advanced Practice Providers (APPs -  Physician Assistants and Nurse Practitioners) who all work together to provide you with the care you need, when you need it.  We recommend signing up for the patient portal called "MyChart".  Sign up information is provided on this After Visit Summary.  MyChart is used to connect with patients for Virtual Visits (Telemedicine).  Patients are able to view lab/test results, encounter notes, upcoming appointments, etc.  Non-urgent messages can be sent to your provider as well.   To learn more about what you can do with MyChart, go to [email or web address].    Your next appointment:   12 month(s)  The format for your next appointment:   In Person  Provider:   You may see [name], MD or one of the following Advanced Practice Providers on your designated Care Team:   Richardson Dopp, PA-C Robbie Lis, [place]   Other Instructions

## 2021-07-06 NOTE — Progress Notes (Signed)
Cardiology Office Note:    Date:  07/06/2021   ID:  [name], DOB November 12, 1951, MRN [id number]  PCP:  [name], MD   [facility] Providers Cardiologist:  [name], MD     Referring MD: [name], MD   Chief Complaint  Patient presents with   Hypertension    History of Present Illness:    [name] is a 70 y.o. female with a hx of Takotsubo cardiomyopathy, presenting today for follow-up evaluation.  The patient presented in 2019 with acute Takotsubo syndrome, LVEF 45% at the time.  Follow-up echo demonstrated normalization of LV function with an LVEF in the range of 70 to 75%.  The patient is also been followed for hypertension and hypertensive heart disease.  The patient is here alone today. She has been doing well. She has tried to minimize stress in her life as much as possible. Today, she denies symptoms of chest pain, shortness of breath, orthopnea, PND, lower extremity edema, dizziness, or syncope. She has heart palpitations when she eats too many sweets.    Past Medical History:  Diagnosis Date   Anemia    Arthritis    Cardiomyopathy ([place])    Cholelithiasis    Chronic back pain    Constipation    Esophageal reflux    Fibromyalgia    Hemorrhoids    Hyperlipidemia    Hypertension    Hypothyroid    IBS (irritable bowel syndrome)    Internal hemorrhoids without mention of complication    Left leg pain    Myocardial infarction [facility]    Personal history of colonic polyps 05/06/2010   ADENOMATOUS POLYP   Pneumonia    Tubular adenoma of colon     Past Surgical History:  Procedure Laterality Date   ABDOMINAL HYSTERECTOMY     CERVICAL DISC SURGERY     PLATE IN NECK & BACK   CHOLECYSTECTOMY     COLONOSCOPY  09/27/2011   NORMAL    COLONOSCOPY WITH PROPOFOL N/A 10/06/2020   Procedure: COLONOSCOPY WITH PROPOFOL;  Surgeon: Irving Copas., MD;  Location: [facility] ENDOSCOPY;  Service: Gastroenterology;  Laterality: N/A;  ultra slim scope     LEFT HEART CATH AND CORONARY ANGIOGRAPHY N/A 05/08/2018   Procedure: LEFT HEART CATH AND CORONARY ANGIOGRAPHY;  Surgeon: Belva Crome, MD;  Location: [place] CV LAB;  Service: Cardiovascular;  Laterality: N/A;   LUMBAR DISC SURGERY     POLYPECTOMY  10/06/2020   Procedure: POLYPECTOMY;  Surgeon: Rush Landmark Telford Nab., MD;  Location: [facility] ENDOSCOPY;  Service: Gastroenterology;;   TONSILLECTOMY      Current Medications: Current Meds  Medication Sig   amLODipine (NORVASC) 5 MG tablet TAKE ONE TABLET BY MOUTH EVERY MORNING   carvedilol (COREG) 6.25 MG tablet TAKE ONE TABLET BY MOUTH AT BREAKFAST AND AT BEDTIME   clopidogrel (PLAVIX) 75 MG tablet TAKE ONE TABLET BY MOUTH EVERY MORNING   famotidine (PEPCID) 40 MG tablet TAKE ONE TABLET BY MOUTH EVERY MORNING   isosorbide mononitrate (IMDUR) 30 MG 24 hr tablet TAKE ONE TABLET BY MOUTH EVERY MORNING   levocetirizine (XYZAL) 5 MG tablet TAKE ONE TABLET BY MOUTH EVERYDAY AT BEDTIME   levothyroxine (SYNTHROID) 100 MCG tablet Take 1 tablet (100 mcg total) by mouth daily.   linaclotide (LINZESS) 145 MCG CAPS capsule Take 1 capsule (145 mcg total) by mouth daily before breakfast.   losartan (COZAAR) 25 MG tablet TAKE ONE TABLET BY MOUTH EVERY MORNING  magnesium oxide (MAG-OX) 400 MG tablet Take 400 mg by mouth daily.   nitroGLYCERIN (NITROSTAT) 0.4 MG SL tablet Place 1 tablet (0.4 mg total) under the tongue every 5 (five) minutes as needed for chest pain.   pantoprazole (PROTONIX) 20 MG tablet TAKE ONE TABLET BY MOUTH EVERY MORNING   rosuvastatin (CRESTOR) 10 MG tablet TAKE ONE TABLET BY MOUTH EVERYDAY AT BEDTIME   Vitamin D, Ergocalciferol, (DRISDOL) 1.25 MG (50000 UNIT) CAPS capsule TAKE ONE CAPSULE BY MOUTH ONCE A WEEK ON SATURDAY     Allergies:   Aspirin, Gadolinium, Iohexol, Livalo [pitavastatin], Hydrocodone-acetaminophen, and Lipitor [atorvastatin]   Social History   Socioeconomic History   Marital status: Widowed    Spouse name: Not on  file   Number of children: 2   Years of education: Not on file   Highest education level: Not on file  Occupational History   Occupation: housewife    Employer: UNEMPLOYED   Tobacco Use   Smoking status: Never   Smokeless tobacco: Never  Vaping Use   Vaping Use: Never used  Substance and Sexual Activity   Alcohol use: No    Alcohol/week: 0.0 standard drinks   Drug use: No   Sexual activity: Not Currently  Other Topics Concern   Not on file  Social History Narrative   Daily Caffeine Use   Social Determinants of Health   Financial Resource Strain: Not on file  Food Insecurity: Not on file  Transportation Needs: Not on file  Physical Activity: Not on file  Stress: Not on file  Social Connections: Not on file     Family History: The patient's family history includes Breast cancer in her cousin and maternal aunt; COPD in her mother; Dementia in her mother; Diabetes in her mother; Heart failure in her father; Hypertension in her mother; Kidney disease in her mother; Lung disease in her mother. There is no history of Colon cancer, Esophageal cancer, Stomach cancer, or Rectal cancer.  ROS:   Please see the history of present illness.    All other systems reviewed and are negative.  EKGs/Labs/Other Studies Reviewed:    The following studies were reviewed today: 2D Echo 08/17/2019: 1. Left ventricular ejection fraction, by visual estimation, is 70 to  75%. The left ventricle has hyperdynamic function. Normal left ventricular  size. There is no left ventricular hypertrophy.   2. Elevated left ventricular end-diastolic pressure.   3. Left ventricular diastolic Doppler parameters are consistent with  impaired relaxation pattern of LV diastolic filling.   4. Global right ventricle has normal systolic function.The right  ventricular size is normal. No increase in right ventricular wall  thickness.   5. Left atrial size was normal.   6. Right atrial size was normal.   7. The  mitral valve is normal in structure. No evidence of mitral valve  regurgitation. No evidence of mitral stenosis.   8. The tricuspid valve is normal in structure. Tricuspid valve  regurgitation was not visualized by color flow Doppler.   9. The aortic valve is normal in structure. Aortic valve regurgitation  was not visualized by color flow Doppler. Structurally normal aortic  valve, with no evidence of sclerosis or stenosis.  10. The pulmonic valve was normal in structure. Pulmonic valve  regurgitation is not visualized by color flow Doppler.  11. The inferior vena cava is normal in size with greater than 50%  respiratory variability, suggesting right atrial pressure of 3 mmHg.   FINDINGS   Left Ventricle: Left  ventricular ejection fraction, by visual estimation,  is 70 to 75%. The left ventricle has hyperdynamic function. No evidence of  left ventricular regional wall motion abnormalities. There is no left  ventricular hypertrophy. Normal  left ventricular size. Spectral Doppler shows Left ventricular diastolic  Doppler parameters are consistent with impaired relaxation pattern of LV  diastolic filling. Elevated left ventricular end-diastolic pressure.   Right Ventricle: The right ventricular size is normal. No increase in  right ventricular wall thickness. Global RV systolic function is has  normal systolic function.   Left Atrium: Left atrial size was normal in size.   Right Atrium: Right atrial size was normal in size   Pericardium: There is no evidence of pericardial effusion.   Mitral Valve: The mitral valve is normal in structure. No evidence of  mitral valve stenosis by observation. No evidence of mitral valve  regurgitation.   Tricuspid Valve: The tricuspid valve is normal in structure. Tricuspid  valve regurgitation was not visualized by color flow Doppler.   Aortic Valve: The aortic valve is normal in structure. Aortic valve  regurgitation was not visualized by color  flow Doppler. The aortic valve  is structurally normal, with no evidence of sclerosis or stenosis.   Pulmonic Valve: The pulmonic valve was normal in structure. Pulmonic valve  regurgitation is not visualized by color flow Doppler.   Aorta: The aortic root, ascending aorta and aortic arch are all  structurally normal, with no evidence of dilitation or obstruction.   Venous: The inferior vena cava is normal in size with greater than 50%  respiratory variability, suggesting right atrial pressure of 3 mmHg.   IAS/Shunts: No atrial level shunt detected by color flow Doppler. No  ventricular septal defect is seen or detected. There is no evidence of an  atrial septal defect.   EKG:  EKG is ordered today.  The ekg ordered today demonstrates NSR 68 bpm, within normal limits  Recent Labs: 12/31/2020: Hemoglobin 12.8; Magnesium 1.9; Platelets 264.0 06/11/2021: BUN 7; Creatinine, Ser 0.81; Potassium 4.1; Sodium 141; TSH 2.00  Recent Lipid Panel    Component Value Date/Time   CHOL 137 06/30/2020 0843   CHOL 109 06/27/2018 0812   TRIG 140 06/30/2020 0843   HDL 52 06/30/2020 0843   HDL 43 06/27/2018 0812   CHOLHDL 2.6 06/30/2020 0843   VLDL 21 08/17/2019 0747   LDLCALC 63 06/30/2020 0843   LDLDIRECT 88.0 10/19/2016 0844     Risk Assessment/Calculations:      Physical Exam:    VS:  BP 134/76   Pulse 68   Ht 5' 7"  (1.702 m)   Wt 185 lb 12.8 oz (84.3 kg)   SpO2 95%   BMI 29.10 kg/m     Wt Readings from Last 3 Encounters:  07/06/21 185 lb 12.8 oz (84.3 kg)  06/11/21 184 lb (83.5 kg)  03/31/21 184 lb (83.5 kg)     GEN:  Well nourished, well developed in no acute distress HEENT: Normal NECK: No JVD; No carotid bruits LYMPHATICS: No lymphadenopathy CARDIAC: RRR, no murmurs, rubs, gallops RESPIRATORY:  Clear to auscultation without rales, wheezing or rhonchi  ABDOMEN: Soft, non-tender, non-distended MUSCULOSKELETAL:  No edema; No deformity  SKIN: Warm and dry NEUROLOGIC:   Alert and oriented x 3 PSYCHIATRIC:  Normal affect   ASSESSMENT:    1. Takotsubo cardiomyopathy   2. Essential hypertension    PLAN:    In order of problems listed above:  The patient has had full recovery of  LV function.  She is tolerating a beta-blocker.  We discussed stress management strategies.  She seems to be doing very well and will return for follow-up in 1 year. Blood pressure is well controlled on amlodipine, carvedilol, isosorbide, and losartan.  Recent labs are reviewed with a creatinine of 0.81 and a potassium of 4.1.  In summary, the patient is stable from a cardiovascular perspective.  I would like to see her back in 1 year for follow-up.  Medication Adjustments/Labs and Tests Ordered: Current medicines are reviewed at length with the patient today.  Concerns regarding medicines are outlined above.  Orders Placed This Encounter  Procedures   EKG 12-Lead    No orders of the defined types were placed in this encounter.   Patient Instructions  Medication Instructions:  No changes *If you need a refill on your cardiac medications before your next appointment, please call your pharmacy*   Lab Work: none If you have labs (blood work) drawn today and your tests are completely normal, you will receive your results only by: [place] (if you have MyChart) OR A paper copy in the mail If you have any lab test that is abnormal or we need to change your treatment, we will call you to review the results.   Testing/Procedures: none   Follow-Up: At [facility], you and your health needs are our priority.  As part of our continuing mission to provide you with exceptional heart care, we have created designated Provider Care Teams.  These Care Teams include your primary Cardiologist (physician) and Advanced Practice Providers (APPs -  Physician Assistants and Nurse Practitioners) who all work together to provide you with the care you need, when you need it.  We  recommend signing up for the patient portal called "MyChart".  Sign up information is provided on this After Visit Summary.  MyChart is used to connect with patients for Virtual Visits (Telemedicine).  Patients are able to view lab/test results, encounter notes, upcoming appointments, etc.  Non-urgent messages can be sent to your provider as well.   To learn more about what you can do with MyChart, go to [email or web address].    Your next appointment:   12 month(s)  The format for your next appointment:   In Person  Provider:   You may see [name], MD or one of the following Advanced Practice Providers on your designated Care Team:   Richardson Dopp, PA-C Robbie Lis, [place]   Other Instructions     Signed, [name], MD  07/06/2021 9:50 AM    [place]

## 2021-07-15 ENCOUNTER — Other Ambulatory Visit: Payer: Self-pay

## 2021-07-15 ENCOUNTER — Ambulatory Visit (INDEPENDENT_AMBULATORY_CARE_PROVIDER_SITE_OTHER): Payer: Medicare Other | Admitting: Family Medicine

## 2021-07-15 ENCOUNTER — Encounter: Payer: Self-pay | Admitting: Family Medicine

## 2021-07-15 DIAGNOSIS — M1712 Unilateral primary osteoarthritis, left knee: Secondary | ICD-10-CM

## 2021-07-15 NOTE — Patient Instructions (Signed)
See me again in 2 months Will get approval for gel

## 2021-07-15 NOTE — Assessment & Plan Note (Signed)
Chronic problem with exacerbation  Discussed HEP

## 2021-07-15 NOTE — Progress Notes (Signed)
[place] [facility] [place] Phone: [phone] Subjective:   [name], am serving as a scribe for Dr. Hulan Saas. This visit occurred during the SARS-CoV-2 public health emergency.  Safety protocols were in place, including screening questions prior to the visit, additional usage of staff PPE, and extensive cleaning of exam room while observing appropriate contact time as indicated for disinfecting solutions.   I'm seeing this patient by the request  of:  [name], MD  CC: Left knee pain follow-up  [id number]  [name] is a 70 y.o. female coming in with complaint of L knee pain. Last seen by Dr. Georgina Snell in Summer 2021 (given steroid injection) and by Dr. [name] in March 2021. Patient states that L knee pain has gotten worse and is unstable. Has not fallen but notices pain now in R knee as well. Injections have helped but patient states that pain over medial aspect is always there.   Patient has had x-rays previously showed the patient had moderate to severe osteoarthritic changes of the tricompartment.  Past Medical History:  Diagnosis Date   Anemia    Arthritis    Cardiomyopathy ([place])    Cholelithiasis    Chronic back pain    Constipation    Esophageal reflux    Fibromyalgia    Hemorrhoids    Hyperlipidemia    Hypertension    Hypothyroid    IBS (irritable bowel syndrome)    Internal hemorrhoids without mention of complication    Left leg pain    Myocardial infarction [facility])    Personal history of colonic polyps 05/06/2010   ADENOMATOUS POLYP   Pneumonia    Tubular adenoma of colon    Past Surgical History:  Procedure Laterality Date   ABDOMINAL HYSTERECTOMY     CERVICAL DISC SURGERY     PLATE IN NECK & BACK   CHOLECYSTECTOMY     COLONOSCOPY  09/27/2011   NORMAL    COLONOSCOPY WITH PROPOFOL N/A 10/06/2020   Procedure: COLONOSCOPY WITH PROPOFOL;  Surgeon: Irving Copas., MD;  Location: [facility]  ENDOSCOPY;  Service: Gastroenterology;  Laterality: N/A;  ultra slim scope    LEFT HEART CATH AND CORONARY ANGIOGRAPHY N/A 05/08/2018   Procedure: LEFT HEART CATH AND CORONARY ANGIOGRAPHY;  Surgeon: Belva Crome, MD;  Location: [place] CV LAB;  Service: Cardiovascular;  Laterality: N/A;   LUMBAR DISC SURGERY     POLYPECTOMY  10/06/2020   Procedure: POLYPECTOMY;  Surgeon: Mansouraty, Telford Nab., MD;  Location: [facility] ENDOSCOPY;  Service: Gastroenterology;;   TONSILLECTOMY     Social History   Socioeconomic History   Marital status: Widowed    Spouse name: Not on file   Number of children: 2   Years of education: Not on file   Highest education level: Not on file  Occupational History   Occupation: housewife    Employer: UNEMPLOYED   Tobacco Use   Smoking status: Never   Smokeless tobacco: Never  Vaping Use   Vaping Use: Never used  Substance and Sexual Activity   Alcohol use: [name]    Alcohol/week: 0.0 standard drinks   Drug use: [name]   Sexual activity: Not Currently  Other Topics Concern   Not on file  Social History Narrative   Daily Caffeine Use   Social Determinants of Health   Financial Resource Strain: Not on file  Food Insecurity: Not on file  Transportation Needs: Not on file  Physical  Activity: Not on file  Stress: Not on file  Social Connections: Not on file   Allergies  Allergen Reactions   Aspirin Rash    unknown   Gadolinium Hives    patient unsure of which kind of dye she had a reaction to until reaction to Magnevist - may be allergic to x-ray contrast, Onset Date: [id number]    Iohexol Hives    patient unsure of which kind of dye she had a reaction to until reaction to Magnevist - may be allergic to x-ray contrast, Onset Date: [id number]   Livalo [Pitavastatin] Other (See Comments)    myalgias   Hydrocodone-Acetaminophen Rash   Lipitor [Atorvastatin] Other (See Comments)    Myalgias   Family History  Problem Relation Age of Onset   Diabetes Mother         aunts and uncles   Kidney disease Mother        stage 3   Lung disease Mother        colapsed lung/in hospice   COPD Mother    Hypertension Mother    Dementia Mother    Breast cancer Maternal Aunt    Heart failure Father        Mother   Breast cancer Cousin    Colon cancer Neg Hx    Esophageal cancer Neg Hx    Stomach cancer Neg Hx    Rectal cancer Neg Hx     Current Outpatient Medications (Endocrine & Metabolic):    levothyroxine (SYNTHROID) 100 MCG tablet, Take 1 tablet (100 mcg total) by mouth daily.  Current Outpatient Medications (Cardiovascular):    amLODipine (NORVASC) 5 MG tablet, TAKE ONE TABLET BY MOUTH EVERY MORNING   carvedilol (COREG) 6.25 MG tablet, TAKE ONE TABLET BY MOUTH AT BREAKFAST AND AT BEDTIME   isosorbide mononitrate (IMDUR) 30 MG 24 hr tablet, TAKE ONE TABLET BY MOUTH EVERY MORNING   losartan (COZAAR) 25 MG tablet, TAKE ONE TABLET BY MOUTH EVERY MORNING   nitroGLYCERIN (NITROSTAT) 0.4 MG SL tablet, Place 1 tablet (0.4 mg total) under the tongue every 5 (five) minutes as needed for chest pain.   rosuvastatin (CRESTOR) 10 MG tablet, TAKE ONE TABLET BY MOUTH EVERYDAY AT BEDTIME  Current Outpatient Medications (Respiratory):    levocetirizine (XYZAL) 5 MG tablet, TAKE ONE TABLET BY MOUTH EVERYDAY AT BEDTIME   Current Outpatient Medications (Hematological):    clopidogrel (PLAVIX) 75 MG tablet, TAKE ONE TABLET BY MOUTH EVERY MORNING  Current Outpatient Medications (Other):    famotidine (PEPCID) 40 MG tablet, TAKE ONE TABLET BY MOUTH EVERY MORNING   linaclotide (LINZESS) 145 MCG CAPS capsule, Take 1 capsule (145 mcg total) by mouth daily before breakfast.   magnesium oxide (MAG-OX) 400 MG tablet, Take 400 mg by mouth daily.   pantoprazole (PROTONIX) 20 MG tablet, TAKE ONE TABLET BY MOUTH EVERY MORNING   Vitamin D, Ergocalciferol, (DRISDOL) 1.25 MG (50000 UNIT) CAPS capsule, TAKE ONE CAPSULE BY MOUTH ONCE A WEEK ON SATURDAY   Reviewed prior external  information including notes and imaging from  primary care provider As well as notes that were available from care everywhere and other healthcare systems.  Past medical history, social, surgical and family history all reviewed in electronic medical record.  [name] pertanent information unless stated regarding to the chief complaint.   Review of Systems:  [name] headache, visual changes, nausea, vomiting, diarrhea, constipation, dizziness, abdominal pain, skin rash, fevers, chills, night sweats, weight loss, swollen lymph nodes, body aches, , chest pain,  shortness of breath, mood changes. POSITIVE muscle aches, joint swelling  Objective  Blood pressure 122/86, pulse 69, height 5' 7"  (1.702 m), weight 186 lb (84.4 kg), SpO2 99 %.   General: [name] apparent distress alert and oriented x3 mood and affect normal, dressed appropriately.  HEENT: Pupils equal, extraocular movements intact  Respiratory: Patient's speak in full sentences and does not appear short of breath  Cardiovascular: [name] lower extremity edema, non tender, [name] erythema  Gait mild antalgic gait Left knee exam does have arthritic changes noted.  Patient does lack the last 5 degrees of flexion.  [name] instability noted with valgus and varus force  After informed written and verbal consent, patient was seated on exam table. Left knee was prepped with alcohol swab and utilizing anterolateral approach, patient's left knee space was injected with 4:1  marcaine 0.5%: Kenalog 65m/dL. Patient tolerated the procedure well without immediate complications.    Impression and Recommendations:     The above documentation has been reviewed and is accurate and complete ZLyndal Pulley DO

## 2021-07-23 ENCOUNTER — Other Ambulatory Visit: Payer: Self-pay | Admitting: Internal Medicine

## 2021-07-23 DIAGNOSIS — Z1231 Encounter for screening mammogram for malignant neoplasm of breast: Secondary | ICD-10-CM

## 2021-08-06 ENCOUNTER — Other Ambulatory Visit: Payer: Self-pay | Admitting: Internal Medicine

## 2021-08-06 DIAGNOSIS — K2100 Gastro-esophageal reflux disease with esophagitis, without bleeding: Secondary | ICD-10-CM

## 2021-08-06 DIAGNOSIS — K219 Gastro-esophageal reflux disease without esophagitis: Secondary | ICD-10-CM

## 2021-08-07 ENCOUNTER — Other Ambulatory Visit: Payer: Self-pay | Admitting: Cardiovascular Disease

## 2021-08-07 ENCOUNTER — Other Ambulatory Visit: Payer: Self-pay | Admitting: Internal Medicine

## 2021-08-07 DIAGNOSIS — E039 Hypothyroidism, unspecified: Secondary | ICD-10-CM

## 2021-08-10 ENCOUNTER — Telehealth: Payer: Self-pay | Admitting: Pharmacist

## 2021-08-10 NOTE — Progress Notes (Signed)
Chronic Care Management Pharmacy Assistant   Name: [name] MRN: [id number] DOB: 1951-07-28  Reason for Encounter: Medication Coordination Call  Recent office visits:  06/11/21 [name] (PCP) - Hypertension  Hypothyroidism. D/c Guaifenesin.   Recent consult visits:  07/15/21 [name] (Sports Medicine) - Primary osteoarthritis of left knee. No med changes.  07/06/21 [name] (Cardiology) - Takotsubo cardiomyopathy. No med changes.  Hospital visits:  None in previous 6 months  Medications: Outpatient Encounter Medications as of 08/10/2021  Medication Sig   amLODipine (NORVASC) 5 MG tablet TAKE ONE TABLET BY MOUTH EVERY MORNING   carvedilol (COREG) 6.25 MG tablet TAKE ONE TABLET BY MOUTH EVERY MORNING and TAKE ONE TABLET BY MOUTH EVERYDAY AT BEDTIME   clopidogrel (PLAVIX) 75 MG tablet TAKE ONE TABLET BY MOUTH EVERY MORNING   famotidine (PEPCID) 40 MG tablet TAKE ONE TABLET BY MOUTH EVERY MORNING   isosorbide mononitrate (IMDUR) 30 MG 24 hr tablet TAKE ONE TABLET BY MOUTH EVERY MORNING   levocetirizine (XYZAL) 5 MG tablet TAKE ONE TABLET BY MOUTH EVERYDAY AT BEDTIME   levothyroxine (SYNTHROID) 100 MCG tablet TAKE ONE TABLET BY MOUTH ONCE daily   linaclotide (LINZESS) 145 MCG CAPS capsule Take 1 capsule (145 mcg total) by mouth daily before breakfast.   losartan (COZAAR) 25 MG tablet TAKE ONE TABLET BY MOUTH EVERY MORNING   magnesium oxide (MAG-OX) 400 MG tablet Take 400 mg by mouth daily.   nitroGLYCERIN (NITROSTAT) 0.4 MG SL tablet Place 1 tablet (0.4 mg total) under the tongue every 5 (five) minutes as needed for chest pain.   pantoprazole (PROTONIX) 20 MG tablet TAKE ONE TABLET BY MOUTH EVERY MORNING   rosuvastatin (CRESTOR) 10 MG tablet TAKE ONE TABLET BY MOUTH EVERYDAY AT BEDTIME   Vitamin D, Ergocalciferol, (DRISDOL) 1.25 MG (50000 UNIT) CAPS capsule TAKE ONE CAPSULE BY MOUTH ONCE WEEKLY ON SATURDAY   No facility-administered encounter medications on file as of 08/10/2021.    Reviewed chart for medication changes ahead of medication coordination call.  No OVs, Consults, or hospital visits since last care coordination call/Pharmacist visit. (If appropriate, list visit date, provider name)  No medication changes indicated OR if recent visit, treatment plan here.  BP Readings from Last 3 Encounters:  07/15/21 122/86  07/06/21 134/76  06/11/21 128/82    Lab Results  Component Value Date   HGBA1C 6.2 06/11/2021     Patient obtains medications through Adherence Packaging  90 Days   Last adherence delivery included:  Amlodipine      5 mg   Carvedilol      6.25 mg   Clopidogrel      75 mg   Famotidine      40 mg   Isosorbide ER      30 mg   Losartan      25 mg   Pantoprazole      20 mg  Potassium ER      20 meq  Levocetirizine      5 mg   Rosuvastatin      10 mg   Vitamin D2     50,000 u    Patient is due for next adherence delivery on: 08/18/21 Called patient and reviewed medications and coordinated delivery.  This delivery to include: Amlodipine      5 mg   Carvedilol      6.25 mg   Clopidogrel      75 mg   Famotidine      40 mg  Isosorbide ER      30 mg   Losartan Potassium     25 mg   Pantoprazole      20 mg  Levocetirizine      5 mg   Rosuvastatin      10 mg   Vitamin D2     50,000 u  Levothyroxine   100 mcg   Patient needs refills for: sent via [facility] Amlodipine Carvedilol Levothyroxine  Confirmed delivery date of 08/18/21, advised patient that pharmacy will contact them the morning of delivery.    Star Rating Drugs: Losartan - last fill 05/18/21 90D Rosuvastatin - last fill 05/18/21 [place], RMA Clinical Pharmacists Assistant [phone]  Time Spent: [id number]

## 2021-09-02 ENCOUNTER — Ambulatory Visit
Admission: RE | Admit: 2021-09-02 | Discharge: 2021-09-02 | Disposition: A | Discharge status: Home / Self Care | Payer: Medicare Other | Source: Ambulatory Visit | Attending: Internal Medicine | Admitting: Internal Medicine

## 2021-09-02 ENCOUNTER — Other Ambulatory Visit: Payer: Self-pay

## 2021-09-02 DIAGNOSIS — Z1231 Encounter for screening mammogram for malignant neoplasm of breast: Secondary | ICD-10-CM | POA: Diagnosis not present

## 2021-09-09 NOTE — Progress Notes (Signed)
Zach Hemi [facility] [place] [address] [place] Phone: [phone] Subjective:   [name], am serving as a scribe for Dr. Hulan Saas. This visit occurred during the SARS-CoV-2 public health emergency.  Safety protocols were in place, including screening questions prior to the visit, additional usage of staff PPE, and extensive cleaning of exam room while observing appropriate contact time as indicated for disinfecting solutions.   I'm seeing this patient by the request  of:  [name], MD  CC: left knee pain   ION:[id number]  07/15/2021 Chronic problem with exacerbation  Discussed HEP   Update 09/15/2021 BRINNLEY LACAP is a 70 y.o. female coming in with complaint of L knee pain. Durolane approved. Patient states knee feels a little better, but is still constantly sore. Wants gel injection today  Patient states that the knee seems to be doing relatively well but still has pain will still has instability.      Past Medical History:  Diagnosis Date   Anemia    Arthritis    Cardiomyopathy ([place])    Cholelithiasis    Chronic back pain    Constipation    Esophageal reflux    Fibromyalgia    Hemorrhoids    Hyperlipidemia    Hypertension    Hypothyroid    IBS (irritable bowel syndrome)    Internal hemorrhoids without mention of complication    Left leg pain    Myocardial infarction [facility])    Personal history of colonic polyps 05/06/2010   ADENOMATOUS POLYP   Pneumonia    Tubular adenoma of colon    Past Surgical History:  Procedure Laterality Date   ABDOMINAL HYSTERECTOMY     CERVICAL DISC SURGERY     PLATE IN NECK & BACK   CHOLECYSTECTOMY     COLONOSCOPY  09/27/2011   NORMAL    COLONOSCOPY WITH PROPOFOL N/A 10/06/2020   Procedure: COLONOSCOPY WITH PROPOFOL;  Surgeon: Irving Copas., MD;  Location: [facility] ENDOSCOPY;  Service: Gastroenterology;  Laterality: N/A;  ultra slim scope    LEFT HEART CATH AND CORONARY  ANGIOGRAPHY N/A 05/08/2018   Procedure: LEFT HEART CATH AND CORONARY ANGIOGRAPHY;  Surgeon: Belva Crome, MD;  Location: [place] CV LAB;  Service: Cardiovascular;  Laterality: N/A;   LUMBAR DISC SURGERY     POLYPECTOMY  10/06/2020   Procedure: POLYPECTOMY;  Surgeon: Mansouraty, Telford Nab., MD;  Location: [facility] ENDOSCOPY;  Service: Gastroenterology;;   TONSILLECTOMY     Social History   Socioeconomic History   Marital status: Widowed    Spouse name: Not on file   Number of children: 2   Years of education: Not on file   Highest education level: Not on file  Occupational History   Occupation: housewife    Employer: UNEMPLOYED   Tobacco Use   Smoking status: Never   Smokeless tobacco: Never  Vaping Use   Vaping Use: Never used  Substance and Sexual Activity   Alcohol use: No    Alcohol/week: 0.0 standard drinks   Drug use: No   Sexual activity: Not Currently  Other Topics Concern   Not on file  Social History Narrative   Daily Caffeine Use   Social Determinants of Health   Financial Resource Strain: Not on file  Food Insecurity: Not on file  Transportation Needs: Not on file  Physical Activity: Not on file  Stress: Not on file  Social Connections: Not on file   Allergies  Allergen Reactions  Aspirin Rash    unknown   Gadolinium Hives    patient unsure of which kind of dye she had a reaction to until reaction to Magnevist - may be allergic to x-ray contrast, Onset Date: [id number]    Iohexol Hives    patient unsure of which kind of dye she had a reaction to until reaction to Magnevist - may be allergic to x-ray contrast, Onset Date: [id number]   Livalo [Pitavastatin] Other (See Comments)    myalgias   Hydrocodone-Acetaminophen Rash   Lipitor [Atorvastatin] Other (See Comments)    Myalgias   Family History  Problem Relation Age of Onset   Diabetes Mother        aunts and uncles   Kidney disease Mother        stage 3   Lung disease Mother        colapsed  lung/in hospice   COPD Mother    Hypertension Mother    Dementia Mother    Breast cancer Maternal Aunt    Heart failure Father        Mother   Breast cancer Cousin    Colon cancer Neg Hx    Esophageal cancer Neg Hx    Stomach cancer Neg Hx    Rectal cancer Neg Hx     Current Outpatient Medications (Endocrine & Metabolic):    levothyroxine (SYNTHROID) 100 MCG tablet, TAKE ONE TABLET BY MOUTH ONCE daily  Current Outpatient Medications (Cardiovascular):    amLODipine (NORVASC) 5 MG tablet, TAKE ONE TABLET BY MOUTH EVERY MORNING   carvedilol (COREG) 6.25 MG tablet, TAKE ONE TABLET BY MOUTH EVERY MORNING and TAKE ONE TABLET BY MOUTH EVERYDAY AT BEDTIME   isosorbide mononitrate (IMDUR) 30 MG 24 hr tablet, TAKE ONE TABLET BY MOUTH EVERY MORNING   losartan (COZAAR) 25 MG tablet, TAKE ONE TABLET BY MOUTH EVERY MORNING   nitroGLYCERIN (NITROSTAT) 0.4 MG SL tablet, Place 1 tablet (0.4 mg total) under the tongue every 5 (five) minutes as needed for chest pain.   rosuvastatin (CRESTOR) 10 MG tablet, TAKE ONE TABLET BY MOUTH EVERYDAY AT BEDTIME  Current Outpatient Medications (Respiratory):    levocetirizine (XYZAL) 5 MG tablet, TAKE ONE TABLET BY MOUTH EVERYDAY AT BEDTIME   Current Outpatient Medications (Hematological):    clopidogrel (PLAVIX) 75 MG tablet, TAKE ONE TABLET BY MOUTH EVERY MORNING  Current Outpatient Medications (Other):    famotidine (PEPCID) 40 MG tablet, TAKE ONE TABLET BY MOUTH EVERY MORNING   linaclotide (LINZESS) 145 MCG CAPS capsule, Take 1 capsule (145 mcg total) by mouth daily before breakfast.   magnesium oxide (MAG-OX) 400 MG tablet, Take 400 mg by mouth daily.   pantoprazole (PROTONIX) 20 MG tablet, TAKE ONE TABLET BY MOUTH EVERY MORNING   Vitamin D, Ergocalciferol, (DRISDOL) 1.25 MG (50000 UNIT) CAPS capsule, TAKE ONE CAPSULE BY MOUTH ONCE WEEKLY ON SATURDAY    Review of Systems:  No headache, visual changes, nausea, vomiting, diarrhea, constipation,  dizziness, abdominal pain, skin rash, fevers, chills, night sweats, weight loss, swollen lymph nodes,  chest pain, shortness of breath, mood changes. POSITIVE muscle aches, body aches, joint swelling  Objective  Blood pressure 130/72, pulse 65, height 5' 7"  (1.702 m), weight 186 lb (84.4 kg), SpO2 98 %.   General: No apparent distress alert and oriented x3 mood and affect normal, dressed appropriately.  HEENT: Pupils equal, extraocular movements intact  Respiratory: Patient's speak in full sentences and does not appear short of breath  Cardiovascular: No lower extremity edema,  non tender, no erythema  Gait mild antalgic MSK: Left knee exam does have instability noted.  Trace effusion noted.  Mild limited range of motion in flexion and extension.  Patient has tightness of the hamstring bilaterally.  After informed written and verbal consent, patient was seated on exam table. Left knee was prepped with alcohol swab and utilizing anterolateral approach, patient's left knee space was injected with 60 mg per 3 mL of Durolane (sodium hyaluronate) in a prefilled syringe was injected easily into the knee through a 22-gauge needle..Patient tolerated the procedure well without immediate complications.   Impression and Recommendations:     The above documentation has been reviewed and is accurate and complete Lyndal Pulley, DO

## 2021-09-15 ENCOUNTER — Other Ambulatory Visit: Payer: Self-pay

## 2021-09-15 ENCOUNTER — Ambulatory Visit (INDEPENDENT_AMBULATORY_CARE_PROVIDER_SITE_OTHER): Payer: Medicare Other | Admitting: Family Medicine

## 2021-09-15 ENCOUNTER — Encounter: Payer: Self-pay | Admitting: Family Medicine

## 2021-09-15 DIAGNOSIS — M1712 Unilateral primary osteoarthritis, left knee: Secondary | ICD-10-CM | POA: Diagnosis not present

## 2021-09-15 NOTE — Patient Instructions (Addendum)
Gel Injection today Call us any and significant redness Takes about a month to work fully See you again in 2 months to make sure you're doing well

## 2021-09-15 NOTE — Assessment & Plan Note (Addendum)
Left knee arthritis noted, chronic with exacerbation.  Continued instability.  Patient wants to avoid any type of surgical intervention.  I am optimistic that patient should do well with this being the first time patient has had any viscosupplementation.  Previous to this over the course of the last 3 years patient was responding well to the steroid injections and home exercises.  Encourage patient to increase activity slowly.  Discussed which activities to do which wants to avoid.  Follow-up again in 6 to 8 weeks.

## 2021-09-21 ENCOUNTER — Other Ambulatory Visit: Payer: Self-pay

## 2021-09-21 ENCOUNTER — Ambulatory Visit (INDEPENDENT_AMBULATORY_CARE_PROVIDER_SITE_OTHER): Payer: Medicare Other | Admitting: Pharmacist

## 2021-09-21 DIAGNOSIS — I25118 Atherosclerotic heart disease of native coronary artery with other forms of angina pectoris: Secondary | ICD-10-CM

## 2021-09-21 DIAGNOSIS — I10 Essential (primary) hypertension: Secondary | ICD-10-CM

## 2021-09-21 DIAGNOSIS — E785 Hyperlipidemia, unspecified: Secondary | ICD-10-CM

## 2021-09-21 NOTE — Progress Notes (Signed)
Chronic Care Management Pharmacy Note  09/21/2021 Name:  [name] MRN:  [id number] DOB:  04-06-1951  Summary: -Pt endorses compliance with medications; denies issues  Recommendations/Changes made from today's visit: -Scheduled flu shot with nurse clinic   Subjective: [name] is an 70 y.o. year old female who is a primary patient of [name], MD.  The CCM team was consulted for assistance with disease management and care coordination needs.    Engaged with patient by telephone for follow up visit in response to provider referral for pharmacy case management and/or care coordination services.   Consent to Services:  The patient was given information about Chronic Care Management services, agreed to services, and gave verbal consent prior to initiation of services.  Please see initial visit note for detailed documentation.   Patient Care Team: [name], MD as PCP - Cyndia Diver, MD as PCP - Cardiology (Cardiology) Charlton Haws, [facility] as Pharmacist (Pharmacist)  Recent office visits: 06/11/21 Dr [name] OV: chronic f/u; no med changes  03/30/21 Dr [name] OV: increased levothyroxine to 100 mcg.  Recent consult visits: 07/15/21 [name] (Sports Medicine) - Primary osteoarthritis of left knee. No med changes.   07/06/21 [name] (Cardiology) - Takotsubo cardiomyopathy. No med changes.  03/31/21 Dr Hilarie Fredrickson (GI): f/u, rec surveillance colonoscopy in 3 years. No med changes  Hospital visits: None in previous 6 months   Objective:  Lab Results  Component Value Date   CREATININE 0.81 06/11/2021   BUN 7 06/11/2021   GFR 73.74 06/11/2021   GFRNONAA 73 06/30/2020   GFRAA 85 06/30/2020   NA 141 06/11/2021   K 4.1 06/11/2021   CALCIUM 9.3 06/11/2021   CO2 29 06/11/2021   GLUCOSE 103 (H) 06/11/2021    Lab Results  Component Value Date/Time   HGBA1C 6.2 06/11/2021 08:49 AM   HGBA1C 6.3 12/31/2020 08:25 AM   GFR 73.74 06/11/2021 08:49 AM   GFR 63.38  03/30/2021 08:33 AM    Last diabetic Eye exam:  Lab Results  Component Value Date/Time   HMDIABEYEEXA no DR 01/07/2012 12:00 AM    Last diabetic Foot exam:  Lab Results  Component Value Date/Time   HMDIABFOOTEX normal 06/14/2012 12:00 AM     Lab Results  Component Value Date   CHOL 137 06/30/2020   HDL 52 06/30/2020   LDLCALC 63 06/30/2020   LDLDIRECT 88.0 10/19/2016   TRIG 140 06/30/2020   CHOLHDL 2.6 06/30/2020    Hepatic Function Latest Ref Rng & Units 06/30/2020 08/13/2019 05/21/2019  Total Protein 6.1 - 8.1 g/dL 7.1 7.7 7.2  Albumin 3.5 - 5.2 g/dL - 4.2 4.1  AST 10 - 35 U/L 15 27 44(H)  ALT 6 - 29 U/L 9 20 34  Alk Phosphatase 39 - 117 U/L - 87 87  Total Bilirubin 0.2 - 1.2 mg/dL 1.3(H) 1.1 1.6(H)  Bilirubin, Direct 0.0 - 0.2 mg/dL 0.3(H) 0.2 0.3    Lab Results  Component Value Date/Time   TSH 2.00 06/11/2021 08:49 AM   TSH 5.30 (H) 03/30/2021 08:33 AM    CBC Latest Ref Rng & Units 12/31/2020 12/02/2019 08/17/2019  WBC 4.0 - 10.5 K/uL 7.0 8.2 8.0  Hemoglobin 12.0 - 15.0 g/dL 12.8 13.8 13.0  Hematocrit 36.0 - 46.0 % 38.6 40.1 36.6  Platelets 150.0 - 400.0 K/uL 264.0 291 263    Lab Results  Component Value Date/Time   VD25OH 58.79 03/30/2021 08:33 AM   VD25OH 76 06/30/2020 08:43 AM  VD25OH 44.77 12/31/2019 08:54 AM    Clinical ASCVD: Yes  The ASCVD Risk score (Arnett DK, et al., 2019) failed to calculate for the following reasons:   The patient has a prior MI or stroke diagnosis    Depression screen [facility] 2/9 03/30/2021 05/21/2020 05/22/2019  Decreased Interest 0 0 0  Down, Depressed, Hopeless 0 0 0  PHQ - 2 Score 0 0 0  Altered sleeping - - -  Tired, decreased energy - - -  Change in appetite - - -  Feeling bad or failure about yourself  - - -  Trouble concentrating - - -  Moving slowly or fidgety/restless - - -  Suicidal thoughts - - -  PHQ-9 Score - - -  Difficult doing work/chores - - -  Some recent data might be hidden      Social History   Tobacco  Use  Smoking Status Never  Smokeless Tobacco Never   BP Readings from Last 3 Encounters:  09/15/21 130/72  07/15/21 122/86  07/06/21 134/76   Pulse Readings from Last 3 Encounters:  09/15/21 65  07/15/21 69  07/06/21 68   Wt Readings from Last 3 Encounters:  09/15/21 186 lb (84.4 kg)  07/15/21 186 lb (84.4 kg)  07/06/21 185 lb 12.8 oz (84.3 kg)   BMI Readings from Last 3 Encounters:  09/15/21 29.13 kg/m  07/15/21 29.13 kg/m  07/06/21 29.10 kg/m    Assessment/Interventions: Review of patient past medical history, allergies, medications, health status, including review of consultants reports, laboratory and other test data, was performed as part of comprehensive evaluation and provision of chronic care management services.   SDOH:  (Social Determinants of Health) assessments and interventions performed: Yes  SDOH Screenings   Alcohol Screen: Not on file  Depression (PHQ2-9): Low Risk    PHQ-2 Score: 0  Financial Resource Strain: Not on file  Food Insecurity: Not on file  Housing: Not on file  Physical Activity: Not on file  Social Connections: Not on file  Stress: Not on file  Tobacco Use: Low Risk    Smoking Tobacco Use: Never   Smokeless Tobacco Use: Never   Passive Exposure: Not on file  Transportation Needs: Not on file    [place]  Allergies  Allergen Reactions   Aspirin Rash    unknown   Gadolinium Hives    patient unsure of which kind of dye she had a reaction to until reaction to Magnevist - may be allergic to x-ray contrast, Onset Date: [id number]    Iohexol Hives    patient unsure of which kind of dye she had a reaction to until reaction to Magnevist - may be allergic to x-ray contrast, Onset Date: 73710626   Livalo [Pitavastatin] Other (See Comments)    myalgias   Hydrocodone-Acetaminophen Rash   Lipitor [Atorvastatin] Other (See Comments)    Myalgias    Medications Reviewed Today     Reviewed by Charlton Haws, [facility] (Pharmacist)  on 09/21/21 at 1416  Med List Status: <None>   Medication Order Taking? Sig Documenting Provider Last Dose Status Informant  amLODipine (NORVASC) 5 MG tablet [id number] Yes TAKE ONE TABLET BY MOUTH EVERY Ellan Lambert, MD Taking Active   carvedilol (COREG) 6.25 MG tablet [id number] Yes TAKE ONE TABLET BY MOUTH EVERY MORNING and TAKE ONE TABLET BY MOUTH EVERYDAY AT BEDTIME [name], MD Taking Active   clopidogrel (PLAVIX) 75 MG tablet [id number] Yes TAKE ONE TABLET BY MOUTH EVERY MORNING [name],  MD Taking Active   famotidine (PEPCID) 40 MG tablet [id number] Yes TAKE ONE TABLET BY MOUTH EVERY MORNING [name], MD Taking Active   isosorbide mononitrate (IMDUR) 30 MG 24 hr tablet [id number] Yes TAKE ONE TABLET BY MOUTH EVERY Ellan Lambert, MD Taking Active   levocetirizine (XYZAL) 5 MG tablet [id number] Yes TAKE ONE TABLET BY MOUTH EVERYDAY AT BEDTIME [name], MD Taking Active   levothyroxine (SYNTHROID) 100 MCG tablet [id number] Yes TAKE ONE TABLET BY MOUTH ONCE daily [name], MD Taking Active   linaclotide [facility]) 145 MCG CAPS capsule [id number] Yes Take 1 capsule (145 mcg total) by mouth daily before breakfast. [name], MD Taking Active   losartan (COZAAR) 25 MG tablet [id number] Yes TAKE ONE TABLET BY MOUTH EVERY Ellan Lambert, MD Taking Active   magnesium oxide (MAG-OX) 400 MG tablet [id number] Yes Take 400 mg by mouth daily. [provider] Taking Active Self  nitroGLYCERIN (NITROSTAT) 0.4 MG SL tablet [id number] Yes Place 1 tablet (0.4 mg total) under the tongue every 5 (five) minutes as needed for chest pain. [name], MD Taking Active Self  pantoprazole (PROTONIX) 20 MG tablet [id number] Yes TAKE ONE TABLET BY MOUTH EVERY MORNING [name], MD Taking Active   rosuvastatin (CRESTOR) 10 MG tablet [id number] Yes TAKE ONE TABLET BY MOUTH EVERYDAY AT BEDTIME [name], MD Taking Active   Vitamin D,  Ergocalciferol, (DRISDOL) 1.25 MG (50000 UNIT) CAPS capsule [id number] Yes TAKE ONE CAPSULE BY MOUTH ONCE WEEKLY ON Ronny Flurry, MD Taking Active             Patient Active Problem List   Diagnosis Date Noted   Hirsutism 05/21/2020   Balding 05/21/2020   Allergic rhinitis 04/12/2020   Takotsubo cardiomyopathy 02/26/2019   NASH (nonalcoholic steatohepatitis) 01/22/2019   Chronic idiopathic constipation 01/12/2019   Arthritis of carpometacarpal (CMC) joint of left thumb 09/25/2018   Degenerative arthritis of left knee 05/24/2018   CAD (coronary artery disease) 05/16/2018   Vitamin D deficiency 11/11/2017   Degenerative disc disease, lumbar 08/29/2017   Prediabetes 06/14/2016   Obesity (BMI 30.0-34.9) 06/14/2016   Routine general medical examination at a health care facility 12/31/2014   Osteopenia 07/20/2013   GERD (gastroesophageal reflux disease) 09/16/2011   Generalized anxiety disorder 09/16/2011   Constipation, slow transit 09/16/2011   Personal history of colonic polyps 08/17/2011   DJD (degenerative joint disease) of knee 06/25/2011   Pure hyperglyceridemia 06/24/2011   Hyperlipidemia LDL goal <70 07/30/2009   Hypothyroidism 04/25/2009   Essential hypertension 04/25/2009   FIBROMYALGIA 04/25/2009    Immunization History  Administered Date(s) Administered   Fluad Quad(high Dose 65+) 08/13/2019, 12/31/2020   Influenza Split 11/15/2011   Influenza Whole 12/11/2009   Influenza, High Dose Seasonal PF 10/19/2016, 12/01/2017, 11/06/2018   Influenza,inj,Quad PF,6+ Mos 08/10/2013, 09/18/2015   PFIZER(Purple Top)SARS-COV-2 Vaccination 03/07/2020, 03/31/2020, 11/17/2020   Pneumococcal Conjugate-13 06/14/2016   Pneumococcal Polysaccharide-23 07/20/2013, 01/12/2019   Td 06/23/2010   Tdap 06/30/2020   Zoster, Live 11/06/2013    Conditions to be addressed/monitored:  Hypertension, Hyperlipidemia, and Coronary Artery Disease  Care Plan : CCM Pharmacy Care Plan   Updates made by Charlton Haws, [place] since 09/21/2021 12:00 AM     Problem: Hypertension, Hyperlipidemia, and Coronary Artery Disease   Priority: High     Long-Range Goal: Disease management   Start Date: 09/21/2021  Expected End Date: 09/21/2022  This Visit's Progress: On track  Priority: High  Note:   Current Barriers:  Unable to independently monitor therapeutic efficacy  Pharmacist Clinical Goal(s):  Patient will achieve adherence to monitoring guidelines and medication adherence to achieve therapeutic efficacy through collaboration with PharmD and provider.   Interventions: 1:1 collaboration with [name], MD regarding development and update of comprehensive plan of care as evidenced by provider attestation and co-signature Inter-disciplinary care team collaboration (see longitudinal plan of care) Comprehensive medication review performed; medication list updated in electronic medical record  Hypertension    BP goal is:  <130/80 Patient checks BP at home daily Patient home BP readings are ranging: 127/75, 133/85, 128/74   Patient has failed these meds in the past: metoprolol, indapamide, olmesartan, HCTZ, Bystolic, valsartan   Patient is currently controlled on the following medications:  amlodipine 5 mg daily AM,  carvedilol 6.25 mg BID,  losartan 25 mg daily,  isosorbide MN 30 mg daily   We discussed: BP is at goal; pt endorses compliance with medications  Plan : Continue current medications   Hyperlipidemia / CAD    LDL goal < 70 Cath 05/08/2018: ACS c/w stress cardiomyopathy, largely patent vessels, no PCI, rec'd risk factor modification.   Patient has failed these meds in past: atorvastatin, pravastatin, pitavastatin   Patient is currently controlled on the following medications:  rosuvastatin 10 mg daily,  clopidogrel 75 mg daily,  nitroglycerin 0.4 mg PRN    We discussed:  LDL is at goal; Pt endorses compliance with medications and denies  side effects   Plan: Continue current medications  Health maintenance  -Vaccine gaps: Shingrix, covid booster, flu -Pt declines another covid booster as she got sick after previous doses -Scheduled flu shot with nurse clinic 10/27   Patient Goals/Self-Care Activities Patient will:  - take medications as prescribed focus on medication adherence by pill packs check blood pressure daily, document, and provide at future appointments      Medication Assistance: Utilizing [facility]  .  Compliance/Adherence/Medication fill history: Care Gaps: None  Star-Rating Drugs: Losartan - LF 08/13/21 x 90 ds Rosuvastatin - LF 08/13/21 x 90 ds  Patient's preferred pharmacy is:  [facility] [place], [place] - [address] [place] [address] Phone: [phone] Fax: [phone]  Uses pill box? Yes - pill pack Pt endorses 100% compliance  We discussed: Current pharmacy is preferred with insurance plan and patient is satisfied with pharmacy services Patient decided to: Utilize [facility] for medication synchronization, packaging and delivery  Care Plan and Follow Up Patient Decision:  Patient agrees to Care Plan and Follow-up.  Plan: Telephone follow up appointment with care management team member scheduled for:  6 months  Charlene Brooke, PharmD, [place], CPP Clinical Pharmacist [facility] [phone]

## 2021-09-21 NOTE — Patient Instructions (Signed)
Visit Information  Phone number for Pharmacist: [phone]   Goals Addressed             This Visit's Progress    Manage My Medicine       Timeframe:  Long-Range Goal Priority:  High Start Date:       09/21/21                      Expected End Date:      09/21/22                 Follow Up Date April 2023   - call for medicine refill 2 or 3 days before it runs out - call if I am sick and can't take my medicine - keep a list of all the medicines I take; vitamins and herbals too - use a pillbox to sort medicine    Why is this important?   These steps will help you keep on track with your medicines.   Notes:         Care Plan : [place]  Updates made by Charlton Haws, RPH since 09/21/2021 12:00 AM     Problem: Hypertension, Hyperlipidemia, and Coronary Artery Disease   Priority: High     Long-Range Goal: Disease management   Start Date: 09/21/2021  Expected End Date: 09/21/2022  This Visit's Progress: On track  Priority: High  Note:   Current Barriers:  Unable to independently monitor therapeutic efficacy  Pharmacist Clinical Goal(s):  Patient will achieve adherence to monitoring guidelines and medication adherence to achieve therapeutic efficacy through collaboration with PharmD and provider.   Interventions: 1:1 collaboration with [name], MD regarding development and update of comprehensive plan of care as evidenced by provider attestation and co-signature Inter-disciplinary care team collaboration (see longitudinal plan of care) Comprehensive medication review performed; medication list updated in electronic medical record  Hypertension    BP goal is:  <130/80 Patient checks BP at home daily Patient home BP readings are ranging: 127/75, 133/85, 128/74   Patient has failed these meds in the past: metoprolol, indapamide, olmesartan, HCTZ, Bystolic, valsartan   Patient is currently controlled on the following medications:   amlodipine 5 mg daily AM,  carvedilol 6.25 mg BID,  losartan 25 mg daily,  isosorbide MN 30 mg daily   We discussed: BP is at goal; pt endorses compliance with medications  Plan : Continue current medications   Hyperlipidemia / CAD    LDL goal < 70 Cath 05/08/2018: ACS c/w stress cardiomyopathy, largely patent vessels, no PCI, rec'd risk factor modification.   Patient has failed these meds in past: atorvastatin, pravastatin, pitavastatin   Patient is currently controlled on the following medications:  rosuvastatin 10 mg daily,  clopidogrel 75 mg daily,  nitroglycerin 0.4 mg PRN    We discussed:  LDL is at goal; Pt endorses compliance with medications and denies side effects   Plan: Continue current medications  Health maintenance  -Vaccine gaps: Shingrix, covid booster, flu -Pt declines another covid booster as she got sick after previous doses -Scheduled flu shot with nurse clinic 10/27   Patient Goals/Self-Care Activities Patient will:  - take medications as prescribed focus on medication adherence by pill packs check blood pressure daily, document, and provide at future appointments      The patient verbalized understanding of instructions, educational materials, and care plan provided today and declined offer to receive copy of patient instructions, educational materials, and  care plan.  Telephone follow up appointment with pharmacy team member scheduled for: 6 months  Charlene Brooke, PharmD, [place], CPP Clinical Pharmacist [facility] [phone]

## 2021-09-24 ENCOUNTER — Ambulatory Visit: Payer: Medicare Other

## 2021-09-24 ENCOUNTER — Other Ambulatory Visit: Payer: Self-pay

## 2021-09-24 DIAGNOSIS — Z23 Encounter for immunization: Secondary | ICD-10-CM

## 2021-09-24 NOTE — Progress Notes (Signed)
Pt given HD Flu Vacc w/o any complications.

## 2021-09-28 DIAGNOSIS — I25118 Atherosclerotic heart disease of native coronary artery with other forms of angina pectoris: Secondary | ICD-10-CM

## 2021-09-28 DIAGNOSIS — I10 Essential (primary) hypertension: Secondary | ICD-10-CM

## 2021-09-28 DIAGNOSIS — E785 Hyperlipidemia, unspecified: Secondary | ICD-10-CM | POA: Diagnosis not present

## 2021-10-30 ENCOUNTER — Telehealth: Payer: Self-pay | Admitting: Internal Medicine

## 2021-10-30 NOTE — Telephone Encounter (Signed)
Patient states she is having excessive sneezing, Patient is requesting an allergy rx  Advised patient to schedule an appt, patient declined  Patient stated is rx is denied, she is requesting a call back to discuss alternative otc medication

## 2021-10-31 ENCOUNTER — Other Ambulatory Visit: Payer: Self-pay | Admitting: Internal Medicine

## 2021-10-31 DIAGNOSIS — J302 Other seasonal allergic rhinitis: Secondary | ICD-10-CM

## 2021-10-31 MED ORDER — LEVOCETIRIZINE DIHYDROCHLORIDE 5 MG PO TABS: ORAL_TABLET | ORAL | 1 refills | Status: DC

## 2021-11-01 ENCOUNTER — Other Ambulatory Visit: Payer: Self-pay | Admitting: Cardiovascular Disease

## 2021-11-12 NOTE — Progress Notes (Signed)
[name] [address] [place] Phone: [phone] Subjective:   [name], am serving as a scribe for Dr. Hulan [name]. This visit occurred during the SARS-CoV-2 public health emergency.  Safety protocols were in place, including screening questions prior to the visit, additional usage of staff PPE, and extensive cleaning of exam room while observing appropriate contact time as indicated for disinfecting solutions.   I'm seeing this patient by the request  of:  Emily Lima, MD  CC: Left knee pain follow-up  [name]  09/15/2021 Left knee arthritis noted, chronic with exacerbation.  Continued instability.  Patient wants to avoid any type of surgical intervention.  I am optimistic that patient should do well with this being the first time patient has had any viscosupplementation.  Previous to this over the course of the last 3 years patient was responding well to the steroid injections and home exercises.  Encourage patient to increase activity slowly.  Discussed which activities to do which wants to avoid.  Follow-up again in 6 to 8 weeks.  Update 11/16/2021 [name] is a 70 y.o. female coming in with complaint of L knee pain. Durolane injection last visit. Patient states gel injection gave a little relief for a short period of time. More pain than before the gel. Hasn't done any interventions. Sore and swollen for a couple of days.       Past Medical History:  Diagnosis Date   Anemia    Arthritis    Cardiomyopathy ([place])    Cholelithiasis    Chronic back pain    Constipation    Esophageal reflux    Fibromyalgia    Hemorrhoids    Hyperlipidemia    Hypertension    Hypothyroid    IBS (irritable bowel syndrome)    Internal hemorrhoids without mention of complication    Left leg pain    Myocardial infarction [facility])    Personal history of colonic polyps 05/06/2010   ADENOMATOUS POLYP   Pneumonia    Tubular  adenoma of colon    Past Surgical History:  Procedure Laterality Date   ABDOMINAL HYSTERECTOMY     CERVICAL DISC SURGERY     PLATE IN NECK & BACK   CHOLECYSTECTOMY     COLONOSCOPY  09/27/2011   NORMAL    COLONOSCOPY WITH PROPOFOL N/A 10/06/2020   Procedure: COLONOSCOPY WITH PROPOFOL;  Surgeon: Irving Copas., MD;  Location: [facility] ENDOSCOPY;  Service: Gastroenterology;  Laterality: N/A;  ultra slim scope    LEFT HEART CATH AND CORONARY ANGIOGRAPHY N/A 05/08/2018   Procedure: LEFT HEART CATH AND CORONARY ANGIOGRAPHY;  Surgeon: Belva Crome, MD;  Location: [facility];  Service: Cardiovascular;  Laterality: N/A;   LUMBAR DISC SURGERY     POLYPECTOMY  10/06/2020   Procedure: POLYPECTOMY;  Surgeon: Mansouraty, Telford Nab., MD;  Location: [facility] ENDOSCOPY;  Service: Gastroenterology;;   TONSILLECTOMY     Social History   Socioeconomic History   Marital status: Widowed    Spouse name: Not on file   Number of children: 2   Years of education: Not on file   Highest education level: Not on file  Occupational History   Occupation: housewife    Employer: UNEMPLOYED   Tobacco Use   Smoking status: Never   Smokeless tobacco: Never  Vaping Use   Vaping Use: Never used  Substance and Sexual Activity   Alcohol use: No    Alcohol/week: 0.0 standard  drinks   Drug use: No   Sexual activity: Not Currently  Other Topics Concern   Not on file  Social History Narrative   Daily Caffeine Use   Social Determinants of Health   Financial Resource Strain: Not on file  Food Insecurity: Not on file  Transportation Needs: Not on file  Physical Activity: Not on file  Stress: Not on file  Social Connections: Not on file   Allergies  Allergen Reactions   Aspirin Rash    unknown   Gadolinium Hives    patient unsure of which kind of dye she had a reaction to until reaction to Magnevist - may be allergic to x-ray contrast, Onset Date: [id number]    Iohexol Hives    patient unsure of  which kind of dye she had a reaction to until reaction to Magnevist - may be allergic to x-ray contrast, Onset Date: [id number]   Livalo [Pitavastatin] Other (See Comments)    myalgias   Hydrocodone-Acetaminophen Rash   Lipitor [Atorvastatin] Other (See Comments)    Myalgias   Family History  Problem Relation Age of Onset   Diabetes Mother        aunts and uncles   Kidney disease Mother        stage 3   Lung disease Mother        colapsed lung/in hospice   COPD Mother    Hypertension Mother    Dementia Mother    Breast cancer Maternal Aunt    Heart failure Father        Mother   Breast cancer Cousin    Colon cancer Neg Hx    Esophageal cancer Neg Hx    Stomach cancer Neg Hx    Rectal cancer Neg Hx     Current Outpatient Medications (Endocrine & Metabolic):    levothyroxine (SYNTHROID) 100 MCG tablet, TAKE ONE TABLET BY MOUTH ONCE daily  Current Outpatient Medications (Cardiovascular):    amLODipine (NORVASC) 5 MG tablet, TAKE ONE TABLET BY MOUTH EVERY MORNING   carvedilol (COREG) 6.25 MG tablet, TAKE ONE TABLET BY MOUTH EVERY MORNING and TAKE ONE TABLET BY MOUTH EVERYDAY AT BEDTIME   isosorbide mononitrate (IMDUR) 30 MG 24 hr tablet, TAKE ONE TABLET BY MOUTH EVERY MORNING   losartan (COZAAR) 25 MG tablet, TAKE ONE TABLET BY MOUTH EVERY MORNING   nitroGLYCERIN (NITROSTAT) 0.4 MG SL tablet, Place 1 tablet (0.4 mg total) under the tongue every 5 (five) minutes as needed for chest pain.   rosuvastatin (CRESTOR) 10 MG tablet, TAKE ONE TABLET BY MOUTH EVERYDAY AT BEDTIME  Current Outpatient Medications (Respiratory):    levocetirizine (XYZAL) 5 MG tablet, TAKE ONE TABLET BY MOUTH EVERYDAY AT BEDTIME   Current Outpatient Medications (Hematological):    clopidogrel (PLAVIX) 75 MG tablet, TAKE ONE TABLET BY MOUTH EVERY MORNING  Current Outpatient Medications (Other):    famotidine (PEPCID) 40 MG tablet, TAKE ONE TABLET BY MOUTH EVERY MORNING   linaclotide (LINZESS) 145 MCG CAPS  capsule, Take 1 capsule (145 mcg total) by mouth daily before breakfast.   magnesium oxide (MAG-OX) 400 MG tablet, Take 400 mg by mouth daily.   pantoprazole (PROTONIX) 20 MG tablet, TAKE ONE TABLET BY MOUTH EVERY MORNING   Vitamin D, Ergocalciferol, (DRISDOL) 1.25 MG (50000 UNIT) CAPS capsule, TAKE ONE CAPSULE BY MOUTH ONCE WEEKLY ON SATURDAY   Reviewed prior external information including notes and imaging from  primary care provider As well as notes that were available from care everywhere and other  healthcare systems.  Past medical history, social, surgical and family history all reviewed in electronic medical record.  No pertanent information unless stated regarding to the chief complaint.   Review of Systems:  No headache, visual changes, nausea, vomiting, diarrhea, constipation, dizziness, abdominal pain, skin rash, fevers, chills, night sweats, weight loss, swollen lymph nodes, body aches, joint swelling, chest pain, shortness of breath, mood changes. POSITIVE muscle aches  Objective  Blood pressure 126/74, pulse 72, height 5' 7"  (1.702 m), weight 183 lb (83 kg), SpO2 99 %.   General: No apparent distress alert and oriented x3 mood and affect normal, dressed appropriately.  HEENT: Pupils equal, extraocular movements intact  Respiratory: Patient's speak in full sentences and does not appear short of breath  Cardiovascular: No lower extremity edema, non tender, no erythema  Gait normal with good balance and coordination.  Left knee exam does show some arthritic changes.  Tender to palpation over the medial joint line.  Instability noted with valgus and varus pressure.  After informed written and verbal consent, patient was seated on exam table. Left knee was prepped with alcohol swab and utilizing anterolateral approach, patient's left knee space was injected with 4:1  marcaine 0.5%: Kenalog 57m/dL. Patient tolerated the procedure well without immediate complications.   Impression  and Recommendations:     The above documentation has been reviewed and is accurate and complete ZLyndal Pulley DO

## 2021-11-13 ENCOUNTER — Other Ambulatory Visit: Payer: Self-pay | Admitting: Internal Medicine

## 2021-11-16 ENCOUNTER — Ambulatory Visit (INDEPENDENT_AMBULATORY_CARE_PROVIDER_SITE_OTHER): Payer: Medicare Other | Admitting: Family Medicine

## 2021-11-16 ENCOUNTER — Other Ambulatory Visit: Payer: Self-pay

## 2021-11-16 ENCOUNTER — Encounter: Payer: Self-pay | Admitting: Family Medicine

## 2021-11-16 DIAGNOSIS — M1712 Unilateral primary osteoarthritis, left knee: Secondary | ICD-10-CM

## 2021-11-16 NOTE — Assessment & Plan Note (Signed)
Patient given injection and tolerated the procedure well, discussed icing regimen and home exercise, discussed which activities to do which wants to avoid.  Increase activity slowly.  Follow-up with me again in 6 to 8 weeks

## 2021-11-16 NOTE — Patient Instructions (Signed)
Injection today Eucerin or Aveeno lotion Lets give it another 2-3 months

## 2021-12-02 ENCOUNTER — Telehealth: Payer: Self-pay | Admitting: Internal Medicine

## 2021-12-02 NOTE — Telephone Encounter (Signed)
LVM for pt to rtn my call to schedule AWV with [facility].  

## 2021-12-11 ENCOUNTER — Other Ambulatory Visit: Payer: Self-pay

## 2021-12-11 ENCOUNTER — Ambulatory Visit (INDEPENDENT_AMBULATORY_CARE_PROVIDER_SITE_OTHER): Payer: Medicare Other

## 2021-12-11 DIAGNOSIS — Z0000 Encounter for general adult medical examination without abnormal findings: Secondary | ICD-10-CM

## 2021-12-11 NOTE — Progress Notes (Signed)
Subjective:   [name] is a 71 y.o. female who presents for Medicare Annual (Subsequent) preventive examination. I connected with [name] today by telephone and verified that I am speaking with the correct person using two identifiers. Location patient: home Location provider: work Persons participating in the virtual visit: patient, provider.   I discussed the limitations, risks, security and privacy concerns of performing an evaluation and management service by telephone and the availability of in person appointments. I also discussed with the patient that there may be a patient responsible charge related to this service. The patient expressed understanding and verbally consented to this telephonic visit.    Interactive audio and video telecommunications were attempted between this provider and patient, however failed, due to patient having technical difficulties OR patient did not have access to video capability.  We continued and completed visit with audio only.    Review of Systems     Cardiac Risk Factors include: advanced age (>13mn, >>58women);hypertension;dyslipidemia     Objective:    Today's Vitals   There is no height or weight on file to calculate BMI.  Advanced Directives 12/11/2021 10/06/2020 08/17/2019 05/22/2019 05/13/2018 05/08/2018 05/08/2018  Does Patient Have a Medical Advance Directive? No No No No No No No  Would patient like information on creating a medical advance directive? No - Patient declined No - Patient declined No - Patient declined No - Patient declined No - Patient declined Yes (ED - Information included in AVS) Yes (ED - Information included in AVS)    Current Medications (verified) Outpatient Encounter Medications as of 12/11/2021  Medication Sig   amLODipine (NORVASC) 5 MG tablet TAKE ONE TABLET BY MOUTH EVERY MORNING   carvedilol (COREG) 6.25 MG tablet TAKE ONE TABLET BY MOUTH EVERY MORNING and TAKE ONE TABLET BY MOUTH EVERYDAY AT BEDTIME    clopidogrel (PLAVIX) 75 MG tablet TAKE ONE TABLET BY MOUTH EVERY MORNING   famotidine (PEPCID) 40 MG tablet TAKE ONE TABLET BY MOUTH EVERY MORNING   isosorbide mononitrate (IMDUR) 30 MG 24 hr tablet TAKE ONE TABLET BY MOUTH EVERY MORNING   levocetirizine (XYZAL) 5 MG tablet TAKE ONE TABLET BY MOUTH EVERYDAY AT BEDTIME   levothyroxine (SYNTHROID) 100 MCG tablet TAKE ONE TABLET BY MOUTH ONCE daily   linaclotide (LINZESS) 145 MCG CAPS capsule Take 1 capsule (145 mcg total) by mouth daily before breakfast.   losartan (COZAAR) 25 MG tablet TAKE ONE TABLET BY MOUTH EVERY MORNING   magnesium oxide (MAG-OX) 400 MG tablet Take 400 mg by mouth daily.   nitroGLYCERIN (NITROSTAT) 0.4 MG SL tablet Place 1 tablet (0.4 mg total) under the tongue every 5 (five) minutes as needed for chest pain.   pantoprazole (PROTONIX) 20 MG tablet TAKE ONE TABLET BY MOUTH EVERY MORNING   rosuvastatin (CRESTOR) 10 MG tablet TAKE ONE TABLET BY MOUTH EVERYDAY AT BEDTIME   Vitamin D, Ergocalciferol, (DRISDOL) 1.25 MG (50000 UNIT) CAPS capsule TAKE ONE CAPSULE BY MOUTH ONCE WEEKLY ON SATURDAY   No facility-administered encounter medications on file as of 12/11/2021.    Allergies (verified) Aspirin, Gadolinium, Iohexol, Livalo [pitavastatin], Hydrocodone-acetaminophen, and Lipitor [atorvastatin]   History: Past Medical History:  Diagnosis Date   Anemia    Arthritis    Cardiomyopathy (HCongress    Cholelithiasis    Chronic back pain    Constipation    Esophageal reflux    Fibromyalgia    Hemorrhoids    Hyperlipidemia    Hypertension    Hypothyroid  IBS (irritable bowel syndrome)    Internal hemorrhoids without mention of complication    Left leg pain    Myocardial infarction [facility])    Personal history of colonic polyps 05/06/2010   ADENOMATOUS POLYP   Pneumonia    Tubular adenoma of colon    Past Surgical History:  Procedure Laterality Date   ABDOMINAL HYSTERECTOMY     CERVICAL DISC SURGERY     PLATE IN NECK &  BACK   CHOLECYSTECTOMY     COLONOSCOPY  09/27/2011   NORMAL    COLONOSCOPY WITH PROPOFOL N/A 10/06/2020   Procedure: COLONOSCOPY WITH PROPOFOL;  Surgeon: Irving Copas., MD;  Location: [facility] ENDOSCOPY;  Service: Gastroenterology;  Laterality: N/A;  ultra slim scope    LEFT HEART CATH AND CORONARY ANGIOGRAPHY N/A 05/08/2018   Procedure: LEFT HEART CATH AND CORONARY ANGIOGRAPHY;  Surgeon: Belva Crome, MD;  Location: [facility] LAB;  Service: Cardiovascular;  Laterality: N/A;   LUMBAR DISC SURGERY     POLYPECTOMY  10/06/2020   Procedure: POLYPECTOMY;  Surgeon: Mansouraty, Telford Nab., MD;  Location: [facility] ENDOSCOPY;  Service: Gastroenterology;;   TONSILLECTOMY     Family History  Problem Relation Age of Onset   Diabetes Mother        aunts and uncles   Kidney disease Mother        stage 22   Lung disease Mother        colapsed lung/in hospice   COPD Mother    Hypertension Mother    Dementia Mother    Breast cancer Maternal Aunt    Heart failure Father        Mother   Breast cancer Cousin    Colon cancer Neg Hx    Esophageal cancer Neg Hx    Stomach cancer Neg Hx    Rectal cancer Neg Hx    Social History   Socioeconomic History   Marital status: Widowed    Spouse name: Not on file   Number of children: 2   Years of education: Not on file   Highest education level: Not on file  Occupational History   Occupation: housewife    Employer: UNEMPLOYED   Tobacco Use   Smoking status: Never   Smokeless tobacco: Never  Vaping Use   Vaping Use: Never used  Substance and Sexual Activity   Alcohol use: No    Alcohol/week: 0.0 standard drinks   Drug use: No   Sexual activity: Not Currently  Other Topics Concern   Not on file  Social History Narrative   Daily Caffeine Use   Social Determinants of Health   Financial Resource Strain: Low Risk    Difficulty of Paying Living Expenses: Not hard at all  Food Insecurity: No Food Insecurity   Worried About Sales executive in the Last Year: Never true   [place] in the Last Year: Never true  Transportation Needs: No Transportation Needs   Lack of Transportation (Medical): No   Lack of Transportation (Non-Medical): No  Physical Activity: Inactive   Days of Exercise per Week: 0 days   Minutes of Exercise per Session: 0 min  Stress: No Stress Concern Present   Feeling of Stress : Not at all  Social Connections: Moderately Isolated   Frequency of Communication with Friends and Family: Twice a week   Frequency of Social Gatherings with Friends and Family: Twice a week   Attends Religious Services: More than 4 times per year  Active Member of Clubs or Organizations: No   Attends Archivist Meetings: Never   Marital Status: Widowed    Tobacco Counseling Counseling given: Not Answered   Clinical Intake:  Pre-visit preparation completed: Yes  Pain : No/denies pain     Nutritional Risks: None Diabetes: No  How often do you need to have someone help you when you read instructions, pamphlets, or other written materials from your doctor or pharmacy?: 1 - Never What is the last grade level you completed in school?: 9 th grade  Diabetic?no  Interpreter Needed?: No  Information entered by :: L.Diogo Anne,LPN   Activities of Daily Living In your present state of health, do you have any difficulty performing the following activities: 12/11/2021 12/31/2020  Hearing? N N  Vision? N N  Difficulty concentrating or making decisions? N N  Walking or climbing stairs? N N  Dressing or bathing? N N  Doing errands, shopping? N N  Preparing Food and eating ? N -  Using the Toilet? N -  In the past six months, have you accidently leaked urine? N -  Do you have problems with loss of bowel control? N -  Managing your Medications? N -  Managing your Finances? N -  Housekeeping or managing your Housekeeping? N -  Some recent data might be hidden    Patient Care Team: [name], MD as  PCP - Cyndia Diver, MD as PCP - Cardiology (Cardiology) Charlton Haws, [facility] as Pharmacist (Pharmacist)  Indicate any recent Medical Services you may have received from other than [facility] providers in the past year (date may be approximate).     Assessment:   This is a routine wellness examination for Tifanie.  Hearing/Vision screen Vision Screening - Comments:: Annual exams wear glasses   Dietary issues and exercise activities discussed: Current Exercise Habits: The patient does not participate in regular exercise at present, Exercise limited by: None identified   Goals Addressed             This Visit's Progress    Patient Stated   On track    I want to continue to eat healthy and exercise. Enjoy life, family and worship God.       Depression Screen PHQ 2/9 Scores 12/11/2021 12/11/2021 03/30/2021 05/21/2020 05/22/2019 10/19/2018 07/11/2018  PHQ - 2 Score 0 0 0 0 0 0 0  PHQ- 9 Score - - - - - - -    Fall Risk Fall Risk  12/11/2021 06/11/2021 05/21/2020 05/22/2019 05/21/2019  Falls in the past year? 0 0 0 0 0  Number falls in past yr: 0 - 0 0 0  Injury with Fall? 0 - 0 - 0  Risk for fall due to : - - Impaired balance/gait - -  Follow up Falls evaluation completed - Falls evaluation completed - Falls evaluation completed    FALL RISK PREVENTION PERTAINING TO THE HOME:  Any stairs in or around the home? No  If so, are there any without handrails? No  Home free of loose throw rugs in walkways, pet beds, electrical cords, etc? Yes  Adequate lighting in your home to reduce risk of falls? Yes   ASSISTIVE DEVICES UTILIZED TO PREVENT FALLS:  Life alert? No  Use of a cane, walker or w/c? No  Grab bars in the bathroom? No  Shower chair or bench in shower? No  Elevated toilet seat or a handicapped toilet? No    Cognitive Function:  Normal cognitive  status assessed by direct observation by this Nurse Health Advisor. No abnormalities found.         Immunizations Immunization History  Administered Date(s) Administered   Fluad Quad(high Dose 65+) 08/13/2019, 12/31/2020   Influenza Split 11/15/2011   Influenza Whole 12/11/2009   Influenza, High Dose Seasonal PF 10/19/2016, 12/01/2017, 11/06/2018   Influenza,inj,Quad PF,6+ Mos 08/10/2013, 09/18/2015   PFIZER(Purple Top)SARS-COV-2 Vaccination 03/07/2020, 03/31/2020, 11/17/2020   Pneumococcal Conjugate-13 06/14/2016   Pneumococcal Polysaccharide-23 07/20/2013, 01/12/2019   Td 06/23/2010   Tdap 06/30/2020   Zoster, Live 11/06/2013    TDAP status: Due, Education has been provided regarding the importance of this vaccine. Advised may receive this vaccine at local pharmacy or Health Dept. Aware to provide a copy of the vaccination record if obtained from local pharmacy or Health Dept. Verbalized acceptance and understanding.  Flu Vaccine status: Up to date  Pneumococcal vaccine status: Up to date  Covid-19 vaccine status: Completed vaccines  Qualifies for Shingles Vaccine? Yes   Zostavax completed No   Shingrix Completed?: No.    Education has been provided regarding the importance of this vaccine. Patient has been advised to call insurance company to determine out of pocket expense if they have not yet received this vaccine. Advised may also receive vaccine at local pharmacy or Health Dept. Verbalized acceptance and understanding.  Screening Tests Health Maintenance  Topic Date Due   Zoster Vaccines- Shingrix (1 of 2) Never done   COVID-19 Vaccine (4 - Booster for Pfizer series) 01/12/2021   INFLUENZA VACCINE  06/29/2021   MAMMOGRAM  09/02/2022   COLONOSCOPY (Pts 45-36yr Insurance coverage will need to be confirmed)  10/07/2023   TETANUS/TDAP  06/30/2030   Pneumonia Vaccine 71 Years old  Completed   DEXA SCAN  Completed   Hepatitis C Screening  Completed   HPV VACCINES  Aged Out    Health Maintenance  Health Maintenance Due  Topic Date Due   Zoster Vaccines- Shingrix  (1 of 2) Never done   COVID-19 Vaccine (4 - Booster for Pfizer series) 01/12/2021   INFLUENZA VACCINE  06/29/2021    Colorectal cancer screening: Type of screening: Colonoscopy. Completed 10/06/2020. Repeat every 5 years  Mammogram status: Completed 09/02/2021. Repeat every year  Bone Density status: Ordered 12/11/2021. Pt provided with contact info and advised to call to schedule appt.  Lung Cancer Screening: (Low Dose CT Chest recommended if Age 648-80years, 30 pack-year currently smoking OR have quit w/in 15years.) does not qualify.   Lung Cancer Screening Referral: n/a  Additional Screening:  Hepatitis C Screening: does not qualify; Completed 11/25/2015  Vision Screening: Recommended annual ophthalmology exams for early detection of glaucoma and other disorders of the eye. Is the patient up to date with their annual eye exam?  Yes  Who is the provider or what is the name of the office in which the patient attends annual eye exams? Dr.Jones  If pt is not established with a provider, would they like to be referred to a provider to establish care? No .   Dental Screening: Recommended annual dental exams for proper oral hygiene  Community Resource Referral / Chronic Care Management: CRR required this visit?  No   CCM required this visit?  No      Plan:     I have personally reviewed and noted the following in the patients chart:   Medical and social history Use of alcohol, tobacco or illicit drugs  Current medications and supplements including opioid prescriptions.  Functional  ability and status Nutritional status Physical activity Advanced directives List of other physicians Hospitalizations, surgeries, and ER visits in previous 12 months Vitals Screenings to include cognitive, depression, and falls Referrals and appointments  In addition, I have reviewed and discussed with patient certain preventive protocols, quality metrics, and best practice recommendations. A  written personalized care plan for preventive services as well as general preventive health recommendations were provided to patient.     Randel Pigg, LPN   [id number]/71/[id number]   Nurse Notes: none

## 2021-12-11 NOTE — Patient Instructions (Signed)
[name] , Thank you for taking time to come for your Medicare Wellness Visit. I appreciate your ongoing commitment to your health goals. Please review the following plan we discussed and let me know if I can assist you in the future.   Screening recommendations/referrals: Colonoscopy: 10/06/2021  due in 5 years  Mammogram: 09/02/2021 Bone Density: referral 12/11/2021 Recommended yearly ophthalmology/optometry visit for glaucoma screening and checkup Recommended yearly dental visit for hygiene and checkup  Vaccinations: Influenza vaccine: completed  Pneumococcal vaccine: completed  Tdap vaccine: due  Shingles vaccine: will consider     Advanced directives: none   Conditions/risks identified: none   Next appointment: 12/14/2021  0800am  Dr [name]    Preventive Care 81 Years and Older, Female Preventive care refers to lifestyle choices and visits with your health care provider that can promote health and wellness. What does preventive care include? A yearly physical exam. This is also called an annual well check. Dental exams once or twice a year. Routine eye exams. Ask your health care provider how often you should have your eyes checked. Personal lifestyle choices, including: Daily care of your teeth and gums. Regular physical activity. Eating a healthy diet. Avoiding tobacco and drug use. Limiting alcohol use. Practicing safe sex. Taking low-dose aspirin every day. Taking vitamin and mineral supplements as recommended by your health care provider. What happens during an annual well check? The services and screenings done by your health care provider during your annual well check will depend on your age, overall health, lifestyle risk factors, and family history of disease. Counseling  Your health care provider may ask you questions about your: Alcohol use. Tobacco use. Drug use. Emotional well-being. Home and relationship well-being. Sexual activity. Eating  habits. History of falls. Memory and ability to understand (cognition). Work and work Statistician. Reproductive health. Screening  You may have the following tests or measurements: Height, weight, and BMI. Blood pressure. Lipid and cholesterol levels. These may be checked every 5 years, or more frequently if you are over 55 years old. Skin check. Lung cancer screening. You may have this screening every year starting at age 70 if you have a 30-pack-year history of smoking and currently smoke or have quit within the past 15 years. Fecal occult blood test (FOBT) of the stool. You may have this test every year starting at age 71. Flexible sigmoidoscopy or colonoscopy. You may have a sigmoidoscopy every 5 years or a colonoscopy every 10 years starting at age 70. Hepatitis C blood test. Hepatitis B blood test. Sexually transmitted disease (STD) testing. Diabetes screening. This is done by checking your blood sugar (glucose) after you have not eaten for a while (fasting). You may have this done every 1-3 years. Bone density scan. This is done to screen for osteoporosis. You may have this done starting at age 62. Mammogram. This may be done every 1-2 years. Talk to your health care provider about how often you should have regular mammograms. Talk with your health care provider about your test results, treatment options, and if necessary, the need for more tests. Vaccines  Your health care provider may recommend certain vaccines, such as: Influenza vaccine. This is recommended every year. Tetanus, diphtheria, and acellular pertussis (Tdap, Td) vaccine. You may need a Td booster every 10 years. Zoster vaccine. You may need this after age 50. Pneumococcal 13-valent conjugate (PCV13) vaccine. One dose is recommended after age 53. Pneumococcal polysaccharide (PPSV23) vaccine. One dose is recommended after age 69. Talk to your  health care provider about which screenings and vaccines you need and how  often you need them. This information is not intended to replace advice given to you by your health care provider. Make sure you discuss any questions you have with your health care provider. Document Released: 12/12/2015 Document Revised: 08/04/2016 Document Reviewed: 09/16/2015 Elsevier Interactive Patient Education  2017 [place] Prevention in the Home Falls can cause injuries. They can happen to people of all ages. There are many things you can do to make your home safe and to help prevent falls. What can I do on the outside of my home? Regularly fix the edges of walkways and driveways and fix any cracks. Remove anything that might make you trip as you walk through a door, such as a raised step or threshold. Trim any bushes or trees on the path to your home. Use bright outdoor lighting. Clear any walking paths of anything that might make someone trip, such as rocks or tools. Regularly check to see if handrails are loose or broken. Make sure that both sides of any steps have handrails. Any raised decks and porches should have guardrails on the edges. Have any leaves, snow, or ice cleared regularly. Use sand or salt on walking paths during winter. Clean up any spills in your garage right away. This includes oil or grease spills. What can I do in the bathroom? Use night lights. Install grab bars by the toilet and in the tub and shower. Do not use towel bars as grab bars. Use non-skid mats or decals in the tub or shower. If you need to sit down in the shower, use a plastic, non-slip stool. Keep the floor dry. Clean up any water that spills on the floor as soon as it happens. Remove soap buildup in the tub or shower regularly. Attach bath mats securely with double-sided non-slip rug tape. Do not have throw rugs and other things on the floor that can make you trip. What can I do in the bedroom? Use night lights. Make sure that you have a light by your bed that is easy to  reach. Do not use any sheets or blankets that are too big for your bed. They should not hang down onto the floor. Have a firm chair that has side arms. You can use this for support while you get dressed. Do not have throw rugs and other things on the floor that can make you trip. What can I do in the kitchen? Clean up any spills right away. Avoid walking on wet floors. Keep items that you use a lot in easy-to-reach places. If you need to reach something above you, use a strong step stool that has a grab bar. Keep electrical cords out of the way. Do not use floor polish or wax that makes floors slippery. If you must use wax, use non-skid floor wax. Do not have throw rugs and other things on the floor that can make you trip. What can I do with my stairs? Do not leave any items on the stairs. Make sure that there are handrails on both sides of the stairs and use them. Fix handrails that are broken or loose. Make sure that handrails are as long as the stairways. Check any carpeting to make sure that it is firmly attached to the stairs. Fix any carpet that is loose or worn. Avoid having throw rugs at the top or bottom of the stairs. If you do have throw rugs, attach them  to the floor with carpet tape. Make sure that you have a light switch at the top of the stairs and the bottom of the stairs. If you do not have them, ask someone to add them for you. What else can I do to help prevent falls? Wear shoes that: Do not have high heels. Have rubber bottoms. Are comfortable and fit you well. Are closed at the toe. Do not wear sandals. If you use a stepladder: Make sure that it is fully opened. Do not climb a closed stepladder. Make sure that both sides of the stepladder are locked into place. Ask someone to hold it for you, if possible. Clearly mark and make sure that you can see: Any grab bars or handrails. First and last steps. Where the edge of each step is. Use tools that help you move  around (mobility aids) if they are needed. These include: Canes. Walkers. Scooters. Crutches. Turn on the lights when you go into a dark area. Replace any light bulbs as soon as they burn out. Set up your furniture so you have a clear path. Avoid moving your furniture around. If any of your floors are uneven, fix them. If there are any pets around you, be aware of where they are. Review your medicines with your doctor. Some medicines can make you feel dizzy. This can increase your chance of falling. Ask your doctor what other things that you can do to help prevent falls. This information is not intended to replace advice given to you by your health care provider. Make sure you discuss any questions you have with your health care provider. Document Released: 09/11/2009 Document Revised: 04/22/2016 Document Reviewed: 12/20/2014 Elsevier Interactive Patient Education  2017 [employer].

## 2021-12-14 ENCOUNTER — Encounter: Payer: Self-pay | Admitting: Internal Medicine

## 2021-12-14 ENCOUNTER — Ambulatory Visit (INDEPENDENT_AMBULATORY_CARE_PROVIDER_SITE_OTHER): Payer: Medicare Other | Admitting: Internal Medicine

## 2021-12-14 ENCOUNTER — Other Ambulatory Visit: Payer: Self-pay

## 2021-12-14 VITALS — BP 132/82 | HR 66 | Temp 97.8°F | Resp 16 | Ht 67.0 in | Wt 183.0 lb

## 2021-12-14 DIAGNOSIS — R7989 Other specified abnormal findings of blood chemistry: Secondary | ICD-10-CM | POA: Diagnosis not present

## 2021-12-14 DIAGNOSIS — E039 Hypothyroidism, unspecified: Secondary | ICD-10-CM | POA: Diagnosis not present

## 2021-12-14 DIAGNOSIS — Z0000 Encounter for general adult medical examination without abnormal findings: Secondary | ICD-10-CM

## 2021-12-14 DIAGNOSIS — K7581 Nonalcoholic steatohepatitis (NASH): Secondary | ICD-10-CM | POA: Diagnosis not present

## 2021-12-14 DIAGNOSIS — E118 Type 2 diabetes mellitus with unspecified complications: Secondary | ICD-10-CM | POA: Diagnosis not present

## 2021-12-14 DIAGNOSIS — E785 Hyperlipidemia, unspecified: Secondary | ICD-10-CM | POA: Diagnosis not present

## 2021-12-14 DIAGNOSIS — I10 Essential (primary) hypertension: Secondary | ICD-10-CM | POA: Diagnosis not present

## 2021-12-14 DIAGNOSIS — M79662 Pain in left lower leg: Secondary | ICD-10-CM | POA: Diagnosis not present

## 2021-12-14 DIAGNOSIS — B351 Tinea unguium: Secondary | ICD-10-CM | POA: Insufficient documentation

## 2021-12-14 DIAGNOSIS — R7303 Prediabetes: Secondary | ICD-10-CM

## 2021-12-14 LAB — HEPATIC FUNCTION PANEL
ALT: 10 U/L (ref 0–35)
AST: 14 U/L (ref 0–37)
Albumin: 4.1 g/dL (ref 3.5–5.2)
Alkaline Phosphatase: 70 U/L (ref 39–117)
Bilirubin, Direct: 0.2 mg/dL (ref 0.0–0.3)
Total Bilirubin: 0.8 mg/dL (ref 0.2–1.2)
Total Protein: 7.2 g/dL (ref 6.0–8.3)

## 2021-12-14 LAB — URINALYSIS, ROUTINE W REFLEX MICROSCOPIC
Bilirubin Urine: NEGATIVE
Hgb urine dipstick: NEGATIVE
Ketones, ur: NEGATIVE
Leukocytes,Ua: NEGATIVE
Nitrite: NEGATIVE
RBC / HPF: NONE SEEN (ref 0–?)
Specific Gravity, Urine: 1.01 (ref 1.000–1.030)
Total Protein, Urine: NEGATIVE
Urine Glucose: NEGATIVE
Urobilinogen, UA: 0.2 (ref 0.0–1.0)
pH: 6 (ref 5.0–8.0)

## 2021-12-14 LAB — LIPID PANEL
Cholesterol: 116 mg/dL (ref 0–200)
HDL: 53.4 mg/dL (ref >39.00)
LDL Cholesterol: 44 mg/dL (ref 0–99)
NonHDL: 62.69
Total CHOL/HDL Ratio: 2
Triglycerides: 92 mg/dL (ref 0.0–149.0)
VLDL: 18.4 mg/dL (ref 0.0–40.0)

## 2021-12-14 LAB — CBC WITH DIFFERENTIAL/PLATELET
Basophils Absolute: 0 10*3/uL (ref 0.0–0.1)
Basophils Relative: 0.6 % (ref 0.0–3.0)
Eosinophils Absolute: 0.3 10*3/uL (ref 0.0–0.7)
Eosinophils Relative: 3.7 % (ref 0.0–5.0)
HCT: 37.4 % (ref 36.0–46.0)
Hemoglobin: 12.3 g/dL (ref 12.0–15.0)
Lymphocytes Relative: 31.1 % (ref 12.0–46.0)
Lymphs Abs: 2.2 10*3/uL (ref 0.7–4.0)
MCHC: 32.8 g/dL (ref 30.0–36.0)
MCV: 80.7 fl (ref 78.0–100.0)
Monocytes Absolute: 0.5 10*3/uL (ref 0.1–1.0)
Monocytes Relative: 6.6 % (ref 3.0–12.0)
Neutro Abs: 4 10*3/uL (ref 1.4–7.7)
Neutrophils Relative %: 58 % (ref 43.0–77.0)
Platelets: 301 10*3/uL (ref 150.0–400.0)
RBC: 4.64 Mil/uL (ref 3.87–5.11)
RDW: 14.9 % (ref 11.5–15.5)
WBC: 7 10*3/uL (ref 4.0–10.5)

## 2021-12-14 LAB — BASIC METABOLIC PANEL
BUN: 13 mg/dL (ref 6–23)
CO2: 29 mEq/L (ref 19–32)
Calcium: 9.2 mg/dL (ref 8.4–10.5)
Chloride: 103 mEq/L (ref 96–112)
Creatinine, Ser: 0.74 mg/dL (ref 0.40–1.20)
GFR: 81.89 mL/min (ref >60.00)
Glucose, Bld: 87 mg/dL (ref 70–99)
Potassium: 4 mEq/L (ref 3.5–5.1)
Sodium: 140 mEq/L (ref 135–145)

## 2021-12-14 LAB — D-DIMER, QUANTITATIVE: D-Dimer, Quant: 1.31 mcg/mL FEU — ABNORMAL HIGH (ref <0.50)

## 2021-12-14 LAB — TSH: TSH: 2.7 u[IU]/mL (ref 0.35–5.50)

## 2021-12-14 LAB — HEMOGLOBIN A1C: Hgb A1c MFr Bld: 6.6 % — ABNORMAL HIGH (ref 4.6–6.5)

## 2021-12-14 NOTE — Patient Instructions (Signed)

## 2021-12-14 NOTE — Progress Notes (Signed)
Subjective:  Patient ID: [name], female    DOB: 12-Dec-1950  Age: 71 y.o. MRN: [id number]  CC: Annual Exam, Hypertension, Hypothyroidism, and Hyperlipidemia  This visit occurred during the SARS-CoV-2 public health emergency.  Safety protocols were in place, including screening questions prior to the visit, additional usage of staff PPE, and extensive cleaning of exam room while observing appropriate contact time as indicated for disinfecting solutions.    HPI ECE [place] presents for a CPX and f/up -  She complains of a 5 day hx of left calf pain with no trauma or injury. She denies claudication. She complains of left great toenail pain. She denies CP, DOE, SOB, palpitations, edema, or cough.  Outpatient Medications Prior to Visit  Medication Sig Dispense Refill   amLODipine (NORVASC) 5 MG tablet TAKE ONE TABLET BY MOUTH EVERY MORNING 90 tablet 3   carvedilol (COREG) 6.25 MG tablet TAKE ONE TABLET BY MOUTH EVERY MORNING and TAKE ONE TABLET BY MOUTH EVERYDAY AT BEDTIME 180 tablet 3   clopidogrel (PLAVIX) 75 MG tablet TAKE ONE TABLET BY MOUTH EVERY MORNING 90 tablet 1   famotidine (PEPCID) 40 MG tablet TAKE ONE TABLET BY MOUTH EVERY MORNING 90 tablet 1   isosorbide mononitrate (IMDUR) 30 MG 24 hr tablet TAKE ONE TABLET BY MOUTH EVERY MORNING 90 tablet 1   levocetirizine (XYZAL) 5 MG tablet TAKE ONE TABLET BY MOUTH EVERYDAY AT BEDTIME 90 tablet 1   levothyroxine (SYNTHROID) 100 MCG tablet TAKE ONE TABLET BY MOUTH ONCE daily 90 tablet 1   linaclotide (LINZESS) 145 MCG CAPS capsule Take 1 capsule (145 mcg total) by mouth daily before breakfast. 90 capsule 1   losartan (COZAAR) 25 MG tablet TAKE ONE TABLET BY MOUTH EVERY MORNING 90 tablet 1   magnesium oxide (MAG-OX) 400 MG tablet Take 400 mg by mouth daily.     nitroGLYCERIN (NITROSTAT) 0.4 MG SL tablet Place 1 tablet (0.4 mg total) under the tongue every 5 (five) minutes as needed for chest pain. 25 tablet 6   pantoprazole (PROTONIX) 20  MG tablet TAKE ONE TABLET BY MOUTH EVERY MORNING 90 tablet 1   rosuvastatin (CRESTOR) 10 MG tablet TAKE ONE TABLET BY MOUTH EVERYDAY AT BEDTIME 90 tablet 1   Vitamin D, Ergocalciferol, (DRISDOL) 1.25 MG (50000 UNIT) CAPS capsule TAKE ONE CAPSULE BY MOUTH ONCE WEEKLY ON SATURDAY 16 capsule 0   No facility-administered medications prior to visit.    ROS Review of Systems  Constitutional:  Negative for chills, diaphoresis, fatigue and fever.  HENT: Negative.    Eyes: Negative.   Respiratory:  Negative for cough, chest tightness, shortness of breath and wheezing.   Cardiovascular:  Negative for chest pain, palpitations and leg swelling.  Gastrointestinal:  Negative for abdominal pain, constipation, diarrhea, nausea and vomiting.  Endocrine: Negative.  Negative for cold intolerance and heat intolerance.  Genitourinary: Negative.  Negative for difficulty urinating and hematuria.  Musculoskeletal:  Positive for arthralgias. Negative for myalgias.  Skin: Negative.   Neurological:  Negative for dizziness, weakness and light-headedness.  Hematological:  Negative for adenopathy. Does not bruise/bleed easily.  Psychiatric/Behavioral: Negative.     Objective:  BP 132/82 (BP Location: Right Arm, Patient Position: Sitting, Cuff Size: Large)    Pulse 66    Temp 97.8 F (36.6 C) (Oral)    Resp 16    Ht 5' 7"  (1.702 m)    Wt 183 lb (83 kg)    SpO2 98%    BMI 28.66 kg/m  BP Readings from Last 3 Encounters:  12/14/21 132/82  11/16/21 126/74  09/15/21 130/72    Wt Readings from Last 3 Encounters:  12/14/21 183 lb (83 kg)  11/16/21 183 lb (83 kg)  09/15/21 186 lb (84.4 kg)    Physical Exam Vitals reviewed.  Constitutional:      Appearance: She is not ill-appearing.  HENT:     Nose: Nose normal.     Mouth/Throat:     Mouth: Mucous membranes are moist.  Eyes:     General: No scleral icterus.    Conjunctiva/sclera: Conjunctivae normal.  Cardiovascular:     Rate and Rhythm: Normal rate and  regular rhythm.     Pulses:          Dorsalis pedis pulses are 1+ on the right side and 1+ on the left side.       Posterior tibial pulses are 1+ on the right side and 1+ on the left side.     Heart sounds: Normal heart sounds, S1 normal and S2 normal.    No gallop.  Pulmonary:     Effort: Pulmonary effort is normal.     Breath sounds: No stridor. No wheezing, rhonchi or rales.  Abdominal:     General: Abdomen is flat.     Palpations: There is no mass.     Tenderness: There is no abdominal tenderness. There is no guarding.     Hernia: No hernia is present.  Musculoskeletal:        General: Normal range of motion.     Cervical back: Neck supple.     Right lower leg: No edema.     Left lower leg: No edema.  Feet:     Left foot:     Toenail Condition: Left toenails are abnormally thick. Fungal disease present. Lymphadenopathy:     Cervical: No cervical adenopathy.  Skin:    General: Skin is warm and dry.     Findings: No lesion or rash.  Neurological:     General: No focal deficit present.     Mental Status: She is alert. Mental status is at baseline.  Psychiatric:        Mood and Affect: Mood normal.        Behavior: Behavior normal.    Lab Results  Component Value Date   WBC 7.0 12/14/2021   HGB 12.3 12/14/2021   HCT 37.4 12/14/2021   PLT 301.0 12/14/2021   GLUCOSE 87 12/14/2021   CHOL 116 12/14/2021   TRIG 92.0 12/14/2021   HDL 53.40 12/14/2021   LDLDIRECT 88.0 10/19/2016   LDLCALC 44 12/14/2021   ALT 10 12/14/2021   AST 14 12/14/2021   NA 140 12/14/2021   K 4.0 12/14/2021   CL 103 12/14/2021   CREATININE 0.74 12/14/2021   BUN 13 12/14/2021   CO2 29 12/14/2021   TSH 2.70 12/14/2021   INR 1.01 05/08/2018   HGBA1C 6.6 (H) 12/14/2021    MM 3D SCREEN BREAST BILATERAL  Result Date: 09/05/2021 CLINICAL DATA:  Screening. EXAM: DIGITAL SCREENING BILATERAL MAMMOGRAM WITH TOMOSYNTHESIS AND CAD TECHNIQUE: Bilateral screening digital craniocaudal and mediolateral  oblique mammograms were obtained. Bilateral screening digital breast tomosynthesis was performed. The images were evaluated with computer-aided detection. COMPARISON:  Previous exam(s). ACR Breast Density Category b: There are scattered areas of fibroglandular density. FINDINGS: There are no findings suspicious for malignancy. IMPRESSION: No mammographic evidence of malignancy. A result letter of this screening mammogram will be mailed directly to the patient.  RECOMMENDATION: Screening mammogram in one year. (Code:SM-B-01Y) BI-RADS CATEGORY  1: Negative. Electronically Signed   By: Ammie Ferrier M.D.   On: 09/05/2021 09:34    Assessment & Plan:   Azizah was seen today for annual exam, hypertension, hypothyroidism and hyperlipidemia.  Diagnoses and all orders for this visit:  NASH (nonalcoholic steatohepatitis)- Her LFT's are normal now. -     Hepatic function panel; Future -     Hepatic function panel  Acquired hypothyroidism- Her TSH is in the normal range. Will stay on the current T4 dose. -     TSH; Future -     TSH  Hyperlipidemia LDL goal <70- LDL goal achieved. Doing well on the statin  -     Lipid panel; Future -     Lipid panel  Prediabetes- Her A1C is up to 6.6% -     Basic metabolic panel; Future -     Hemoglobin A1c; Future -     Hemoglobin A1c -     Basic metabolic panel  Routine general medical examination at a health care facility- Exam completed, labs reviewed, vaccine and cancer screenings are UTD. Pt ed material was given.   Essential hypertension- Her BP is well controlled. -     CBC with Differential/Platelet; Future -     Urinalysis, Routine w reflex microscopic; Future -     Urinalysis, Routine w reflex microscopic -     CBC with Differential/Platelet  Onychomycosis of left great toe -     Ambulatory referral to Podiatry  Pain of left calf- Her d-dimer is mildly elevated. Will screen for DVT. -     D-dimer, quantitative; Future -     D-dimer,  quantitative -     VAS [place] LOWER EXTREMITY VENOUS (DVT); Future  Type II diabetes mellitus with manifestations [facility])- Medical tx is not yet indicated.  D-dimer, elevated -     VAS [place] LOWER EXTREMITY VENOUS (DVT); Future   I am having [name] maintain her linaclotide, nitroGLYCERIN, magnesium oxide, pantoprazole, famotidine, carvedilol, amLODipine, levothyroxine, levocetirizine, losartan, isosorbide mononitrate, clopidogrel, rosuvastatin, and Vitamin D (Ergocalciferol).  No orders of the defined types were placed in this encounter.    Follow-up: Return in about 3 months (around 03/14/2022).  [name], MD

## 2021-12-17 ENCOUNTER — Other Ambulatory Visit: Payer: Self-pay

## 2021-12-17 ENCOUNTER — Telehealth: Payer: Self-pay | Admitting: Internal Medicine

## 2021-12-17 ENCOUNTER — Ambulatory Visit (HOSPITAL_COMMUNITY)
Admission: RE | Admit: 2021-12-17 | Discharge: 2021-12-17 | Disposition: A | Discharge status: Home / Self Care | Payer: Medicare Other | Source: Ambulatory Visit | Attending: Internal Medicine | Admitting: Internal Medicine

## 2021-12-17 ENCOUNTER — Ambulatory Visit (INDEPENDENT_AMBULATORY_CARE_PROVIDER_SITE_OTHER): Payer: Medicare Other | Admitting: Podiatry

## 2021-12-17 ENCOUNTER — Other Ambulatory Visit: Payer: Self-pay | Admitting: Internal Medicine

## 2021-12-17 DIAGNOSIS — R7989 Other specified abnormal findings of blood chemistry: Secondary | ICD-10-CM | POA: Diagnosis not present

## 2021-12-17 DIAGNOSIS — M79675 Pain in left toe(s): Secondary | ICD-10-CM

## 2021-12-17 DIAGNOSIS — B351 Tinea unguium: Secondary | ICD-10-CM

## 2021-12-17 DIAGNOSIS — M7122 Synovial cyst of popliteal space [Baker], left knee: Secondary | ICD-10-CM | POA: Insufficient documentation

## 2021-12-17 DIAGNOSIS — L603 Nail dystrophy: Secondary | ICD-10-CM | POA: Diagnosis not present

## 2021-12-17 DIAGNOSIS — L814 Other melanin hyperpigmentation: Secondary | ICD-10-CM | POA: Diagnosis not present

## 2021-12-17 DIAGNOSIS — M79662 Pain in left lower leg: Secondary | ICD-10-CM | POA: Diagnosis not present

## 2021-12-17 NOTE — Telephone Encounter (Signed)
DVT was negative but patient has fluid on side of leg.

## 2021-12-17 NOTE — Telephone Encounter (Signed)
Pt has been informed of results and referral to ortho. She has expressed understanding.

## 2021-12-20 NOTE — Progress Notes (Signed)
Subjective:   Patient ID: [name], female   DOB: 71 y.o.   MRN: [id number]   HPI 71 year old female presents the office today for concerns of fungus on her left big toenail.  She states the nail has become discolored over the last year but over the last 2 to 3 weeks to start to get a little bit more sore.  Pain level is 1/10.  She states about 3 years ago the nail came off and came in thick and discolored.  Currently denies any drainage or pus coming from the toenail.    Review of Systems  All other systems reviewed and are negative.  Past Medical History:  Diagnosis Date   Anemia    Arthritis    Cardiomyopathy ([place])    Cholelithiasis    Chronic back pain    Constipation    Esophageal reflux    Fibromyalgia    Hemorrhoids    Hyperlipidemia    Hypertension    Hypothyroid    IBS (irritable bowel syndrome)    Internal hemorrhoids without mention of complication    Left leg pain    Myocardial infarction [facility])    Personal history of colonic polyps 05/06/2010   ADENOMATOUS POLYP   Pneumonia    Tubular adenoma of colon     Past Surgical History:  Procedure Laterality Date   ABDOMINAL HYSTERECTOMY     CERVICAL DISC SURGERY     PLATE IN NECK & BACK   CHOLECYSTECTOMY     COLONOSCOPY  09/27/2011   NORMAL    COLONOSCOPY WITH PROPOFOL N/A 10/06/2020   Procedure: COLONOSCOPY WITH PROPOFOL;  Surgeon: Irving Copas., MD;  Location: [facility] ENDOSCOPY;  Service: Gastroenterology;  Laterality: N/A;  ultra slim scope    LEFT HEART CATH AND CORONARY ANGIOGRAPHY N/A 05/08/2018   Procedure: LEFT HEART CATH AND CORONARY ANGIOGRAPHY;  Surgeon: Belva Crome, MD;  Location: [place] CV LAB;  Service: Cardiovascular;  Laterality: N/A;   LUMBAR DISC SURGERY     POLYPECTOMY  10/06/2020   Procedure: POLYPECTOMY;  Surgeon: Rush Landmark Telford Nab., MD;  Location: [facility] ENDOSCOPY;  Service: Gastroenterology;;   TONSILLECTOMY       Current Outpatient Medications:    amLODipine (NORVASC) 5  MG tablet, TAKE ONE TABLET BY MOUTH EVERY MORNING, Disp: 90 tablet, Rfl: 3   carvedilol (COREG) 6.25 MG tablet, TAKE ONE TABLET BY MOUTH EVERY MORNING and TAKE ONE TABLET BY MOUTH EVERYDAY AT BEDTIME, Disp: 180 tablet, Rfl: 3   clopidogrel (PLAVIX) 75 MG tablet, TAKE ONE TABLET BY MOUTH EVERY MORNING, Disp: 90 tablet, Rfl: 1   famotidine (PEPCID) 40 MG tablet, TAKE ONE TABLET BY MOUTH EVERY MORNING, Disp: 90 tablet, Rfl: 1   isosorbide mononitrate (IMDUR) 30 MG 24 hr tablet, TAKE ONE TABLET BY MOUTH EVERY MORNING, Disp: 90 tablet, Rfl: 1   levocetirizine (XYZAL) 5 MG tablet, TAKE ONE TABLET BY MOUTH EVERYDAY AT BEDTIME, Disp: 90 tablet, Rfl: 1   levothyroxine (SYNTHROID) 100 MCG tablet, TAKE ONE TABLET BY MOUTH ONCE daily, Disp: 90 tablet, Rfl: 1   linaclotide (LINZESS) 145 MCG CAPS capsule, Take 1 capsule (145 mcg total) by mouth daily before breakfast., Disp: 90 capsule, Rfl: 1   losartan (COZAAR) 25 MG tablet, TAKE ONE TABLET BY MOUTH EVERY MORNING, Disp: 90 tablet, Rfl: 1   magnesium oxide (MAG-OX) 400 MG tablet, Take 400 mg by mouth daily., Disp: , Rfl:    nitroGLYCERIN (NITROSTAT) 0.4 MG SL tablet, Place 1 tablet (0.4 mg  total) under the tongue every 5 (five) minutes as needed for chest pain., Disp: 25 tablet, Rfl: 6   pantoprazole (PROTONIX) 20 MG tablet, TAKE ONE TABLET BY MOUTH EVERY MORNING, Disp: 90 tablet, Rfl: 1   rosuvastatin (CRESTOR) 10 MG tablet, TAKE ONE TABLET BY MOUTH EVERYDAY AT BEDTIME, Disp: 90 tablet, Rfl: 1   Vitamin D, Ergocalciferol, (DRISDOL) 1.25 MG (50000 UNIT) CAPS capsule, TAKE ONE CAPSULE BY MOUTH ONCE WEEKLY ON SATURDAY, Disp: 16 capsule, Rfl: 0  Allergies  Allergen Reactions   Aspirin Rash    unknown   Gadolinium Hives    patient unsure of which kind of dye she had a reaction to until reaction to Magnevist - may be allergic to x-ray contrast, Onset Date: [id number]    Iohexol Hives    patient unsure of which kind of dye she had a reaction to until reaction to  Magnevist - may be allergic to x-ray contrast, Onset Date: [id number]   Livalo [Pitavastatin] Other (See Comments)    myalgias   Hydrocodone-Acetaminophen Rash   Lipitor [Atorvastatin] Other (See Comments)    Myalgias          Objective:  Physical Exam  General: AAO x3, NAD  Dermatological: Left hallux toenails hypertrophic, dystrophic with brown discoloration.  There is no edema, erythema or signs of infection noted today.  There is no drainage or pus.  There is no open lesions.  Vascular: Dorsalis Pedis artery and Posterior Tibial artery pedal pulses are palpable bilateral with immedate capillary fill time.  There is no pain with calf compression, swelling, warmth, erythema.   Neruologic: Grossly intact via light touch bilateral.  Musculoskeletal: Slight tenderness palpation to the distal portion of left hallux toenail.  muscular strength 5/5 in all groups tested bilateral.  Gait: Unassisted, Nonantalgic.       Assessment:   Left hallux onychomycosis    Plan:  -Treatment options discussed including all alternatives, risks, and complications -Etiology of symptoms were discussed -The nail removal but ultimately after discussion today decided to proceed with nail debridement.  I was able to debride the nail quite a bit to remove the distal portion of nail as well as the thickness.  I sent the nail for culture, pathology to [facility] for evaluation.  Monitor for any signs or symptoms of infection.  If symptoms continue or worsen will likely need to have total nail avulsion.  Trula Slade DPM

## 2021-12-24 ENCOUNTER — Ambulatory Visit (INDEPENDENT_AMBULATORY_CARE_PROVIDER_SITE_OTHER): Payer: Medicare Other | Admitting: Orthopaedic Surgery

## 2021-12-24 ENCOUNTER — Other Ambulatory Visit: Payer: Self-pay

## 2021-12-24 ENCOUNTER — Encounter: Payer: Self-pay | Admitting: Orthopaedic Surgery

## 2021-12-24 ENCOUNTER — Ambulatory Visit: Payer: Self-pay

## 2021-12-24 DIAGNOSIS — M1712 Unilateral primary osteoarthritis, left knee: Secondary | ICD-10-CM | POA: Diagnosis not present

## 2021-12-24 NOTE — Progress Notes (Signed)
Office Visit Note   Patient: [name]           Date of Birth: [birth year]           MRN: [id number] Visit Date: 12/24/2021              Requested by: [name], MD [address] [place],  [place] [address] PCP: [name], MD   Assessment & Plan: Visit Diagnoses:  1. Primary osteoarthritis of left knee     Plan: Impression is left knee moderate DJD incidental finding of Baker's cyst.  I explained the nature of a Baker's cyst today.  We talked about treatment options for the DJD.  For now conservative management is still effective therefore she will continue this.  If she chooses to have the Baker's cyst aspirated she should contact Dr. [name] but she understands that the risk for recurrence is fairly high.  She has my card if she needs anything.  We will see her back as needed.  Follow-Up Instructions: No follow-ups on file.   Orders:  Orders Placed This Encounter  Procedures   XR KNEE 3 VIEW LEFT   No orders of the defined types were placed in this encounter.     Procedures: No procedures performed   Clinical Data: No additional findings.   Subjective: Chief Complaint  Patient presents with   Left Knee - Pain    [name] is a very pleasant 71 year old female referral from Dr. [name] for incidental finding of Baker's cyst on ultrasound last week to rule out DVT that was ordered by her primary care Dr. Scarlette [name].  She is and is doing well overall.  She last received cortisone injection in December by Dr. [name].  She feels that the pain is tolerable and she is able to live with this pain.   Review of Systems  Constitutional: Negative.   HENT: Negative.    Eyes: Negative.   Respiratory: Negative.    Cardiovascular: Negative.   Endocrine: Negative.   Musculoskeletal: Negative.   Neurological: Negative.   Hematological: Negative.   Psychiatric/Behavioral: Negative.    All other systems reviewed and are negative.   Objective: Vital Signs: There  were no vitals taken for this visit.  Physical Exam Vitals and nursing note reviewed.  Constitutional:      Appearance: She is well-developed.  Pulmonary:     Effort: Pulmonary effort is normal.  Skin:    General: Skin is warm.     Capillary Refill: Capillary refill takes less than 2 seconds.  Neurological:     Mental Status: She is alert and oriented to person, place, and time.  Psychiatric:        Behavior: Behavior normal.        Thought Content: Thought content normal.        Judgment: Judgment normal.    Ortho Exam  Examination left knee shows trace effusion.  Small Baker's cyst is palpable.  Collaterals and cruciates are stable.  Mild patellofemoral crepitus with range of motion.  No significant joint tenderness.  Specialty Comments:  No specialty comments available.  Imaging: XR KNEE 3 VIEW LEFT  Result Date: 12/24/2021 Moderate tricompartmental degenerative joint disease.  Periarticular spurring.    PMFS History: Patient Active Problem List   Diagnosis Date Noted   Primary osteoarthritis of left knee 12/24/2021   Bakers cyst, left 12/17/2021   Onychomycosis of left great toe 12/14/2021   Pain of left calf 12/14/2021  Type II diabetes mellitus with manifestations ([place]) 12/14/2021   D-dimer, elevated 12/14/2021   Hirsutism 05/21/2020   Balding 05/21/2020   Allergic rhinitis 04/12/2020   Takotsubo cardiomyopathy 02/26/2019   NASH (nonalcoholic steatohepatitis) 01/22/2019   Chronic idiopathic constipation 01/12/2019   Arthritis of carpometacarpal ([place]) joint of left thumb 09/25/2018   Degenerative arthritis of left knee 05/24/2018   CAD (coronary artery disease) 05/16/2018   Vitamin D deficiency 11/11/2017   Degenerative disc disease, lumbar 08/29/2017   Prediabetes 06/14/2016   Obesity (BMI 30.0-34.9) 06/14/2016   Routine general medical examination at a health care facility 12/31/2014   Osteopenia 07/20/2013   GERD (gastroesophageal reflux disease)  09/16/2011   Generalized anxiety disorder 09/16/2011   Constipation, slow transit 09/16/2011   Personal history of colonic polyps 08/17/2011   DJD (degenerative joint disease) of knee 06/25/2011   Pure hyperglyceridemia 06/24/2011   Hyperlipidemia LDL goal <70 07/30/2009   Hypothyroidism 04/25/2009   Essential hypertension 04/25/2009   FIBROMYALGIA 04/25/2009   Past Medical History:  Diagnosis Date   Anemia    Arthritis    Cardiomyopathy ([place])    Cholelithiasis    Chronic back pain    Constipation    Esophageal reflux    Fibromyalgia    Hemorrhoids    Hyperlipidemia    Hypertension    Hypothyroid    IBS (irritable bowel syndrome)    Internal hemorrhoids without mention of complication    Left leg pain    Myocardial infarction [facility])    Personal history of colonic polyps 05/06/2010   ADENOMATOUS POLYP   Pneumonia    Tubular adenoma of colon     Family History  Problem Relation Age of Onset   Diabetes Mother        aunts and uncles   Kidney disease Mother        stage 3   Lung disease Mother        colapsed lung/in hospice   COPD Mother    Hypertension Mother    Dementia Mother    Breast cancer Maternal Aunt    Heart failure Father        Mother   Breast cancer Cousin    Colon cancer Neg Hx    Esophageal cancer Neg Hx    Stomach cancer Neg Hx    Rectal cancer Neg Hx     Past Surgical History:  Procedure Laterality Date   ABDOMINAL HYSTERECTOMY     CERVICAL DISC SURGERY     PLATE IN NECK & BACK   CHOLECYSTECTOMY     COLONOSCOPY  09/27/2011   NORMAL    COLONOSCOPY WITH PROPOFOL N/A 10/06/2020   Procedure: COLONOSCOPY WITH PROPOFOL;  Surgeon: Irving Copas., MD;  Location: [facility] ENDOSCOPY;  Service: Gastroenterology;  Laterality: N/A;  ultra slim scope    LEFT HEART CATH AND CORONARY ANGIOGRAPHY N/A 05/08/2018   Procedure: LEFT HEART CATH AND CORONARY ANGIOGRAPHY;  Surgeon: Belva Crome, MD;  Location: [place] CV LAB;  Service: Cardiovascular;   Laterality: N/A;   LUMBAR DISC SURGERY     POLYPECTOMY  10/06/2020   Procedure: POLYPECTOMY;  Surgeon: Mansouraty, Telford Nab., MD;  Location: [place];  Service: Gastroenterology;;   TONSILLECTOMY     Social History   Occupational History   Occupation: housewife    Employer: UNEMPLOYED   Tobacco Use   Smoking status: Never   Smokeless tobacco: Never  Vaping Use   Vaping Use: Never used  Substance and Sexual Activity  Alcohol use: No    Alcohol/week: 0.0 standard drinks   Drug use: No   Sexual activity: Not Currently

## 2022-01-06 ENCOUNTER — Telehealth: Payer: Self-pay

## 2022-01-06 ENCOUNTER — Telehealth: Payer: Self-pay | Admitting: *Deleted

## 2022-01-06 NOTE — Telephone Encounter (Signed)
Patient is calling for [facility] results from 12/17/21, please advise.

## 2022-01-06 NOTE — Telephone Encounter (Signed)
Patient called the office stating she was returning you phone call.   Please advise.Marland Kitchen

## 2022-01-07 NOTE — Telephone Encounter (Signed)
Patient returning the call to Dr Jacqualyn Posey, please try her mobile phone first she is not always at home.

## 2022-01-08 DIAGNOSIS — H40003 Preglaucoma, unspecified, bilateral: Secondary | ICD-10-CM | POA: Diagnosis not present

## 2022-01-08 NOTE — Telephone Encounter (Signed)
Patient agreed to 01/11/22 8am for nail removal.

## 2022-01-11 ENCOUNTER — Other Ambulatory Visit: Payer: Self-pay

## 2022-01-11 ENCOUNTER — Ambulatory Visit (INDEPENDENT_AMBULATORY_CARE_PROVIDER_SITE_OTHER): Payer: Medicare Other | Admitting: Podiatry

## 2022-01-11 DIAGNOSIS — B351 Tinea unguium: Secondary | ICD-10-CM | POA: Diagnosis not present

## 2022-01-11 DIAGNOSIS — L819 Disorder of pigmentation, unspecified: Secondary | ICD-10-CM | POA: Diagnosis not present

## 2022-01-11 DIAGNOSIS — L814 Other melanin hyperpigmentation: Secondary | ICD-10-CM | POA: Diagnosis not present

## 2022-01-11 MED ORDER — CEPHALEXIN 500 MG PO CAPS: 500.0000 mg | ORAL_CAPSULE | Freq: Three times a day (TID) | ORAL | 0 refills | Status: DC

## 2022-01-11 NOTE — Patient Instructions (Signed)

## 2022-01-13 NOTE — Progress Notes (Signed)
Subjective: 71 year old female presents the office today for follow evaluation of left hallux toenail discoloration, thickening as well as melanin pigment.  She states the nail recently has become more thick and discolored.  Denies any drainage or pus.  She has no other concerns today.  Objective: AAO x3, NAD DP/PT pulses palpable bilaterally, CRT less than 3 seconds The LEFT hallux nail is hypertrophic, dystrophic with brown discoloration.  Mild hyperpigmentation noted.  There is no suturing hyperpigmentation of the surrounding skin appears to be localized to the toenail.  There is no pain in the nails no swelling redness or any signs of infection. Right hallux toenail without significant pathology. No pain with calf compression, swelling, warmth, erythema  Assessment: Left hallux toenail onychomycosis, melanin pigment  Plan: -All treatment options discussed with the patient including all alternatives, risks, complications.  -Given the thickening the nail as well as fungus and concern for melanin pigment recommended nail removal and further biopsy.  We discussed the procedure and she wishes to proceed.  Discussed risk and consent was signed.  Skin was prepped with alcohol and 3 cc of lidocaine, Marcaine plain was infiltrated in a digital block fashion.  Once anesthetized the skin was prepped with Betadine and a tourniquet applied.  At this time the nail was removed in toto without any complications.  This was sent to pathology.  A separate 3 mm punch biopsy of the nail matrix was also obtained.  There is no extension of the hyperpigmentation of the surrounding skin or nailbed.  I irrigated the wound with alcohol and Silvadene was applied followed by dressing.  Tourniquet was released there was found to be immediate cap refill time.  She tolerated the procedure well any complications.  Post procedure instructions discussed. -Pathology sent to [facility]- Given to [place], RN -Surgical shoe dispensed.    Trula Slade DPM

## 2022-01-25 ENCOUNTER — Ambulatory Visit: Payer: Medicare Other | Admitting: Podiatry

## 2022-01-26 ENCOUNTER — Ambulatory Visit (INDEPENDENT_AMBULATORY_CARE_PROVIDER_SITE_OTHER): Payer: Medicare Other | Admitting: Podiatry

## 2022-01-26 ENCOUNTER — Other Ambulatory Visit: Payer: Self-pay

## 2022-01-26 DIAGNOSIS — B351 Tinea unguium: Secondary | ICD-10-CM

## 2022-01-26 DIAGNOSIS — L819 Disorder of pigmentation, unspecified: Secondary | ICD-10-CM | POA: Diagnosis not present

## 2022-01-26 LAB — HM DIABETES EYE EXAM

## 2022-01-26 MED ORDER — EFINACONAZOLE 10 % EX SOLN
1.0000 [drp] | Freq: Every day | CUTANEOUS | 11 refills | Status: DC
Start: 2022-01-26 — End: 2022-01-29

## 2022-01-26 NOTE — Patient Instructions (Signed)
Efinaconazole Topical Solution What is this medication? EFINACONAZOLE (e FEE na KON a zole) is an antifungal medicine. It is used to treat certain kinds of fungal infections of the toenail. This medicine may be used for other purposes; ask your health care provider or pharmacist if you have questions. COMMON BRAND NAME(S): JUBLIA What should I tell my care team before I take this medication? They need to know if you have any of these conditions: an unusual or allergic reaction to efinaconazole, other medicines, foods, dyes or preservatives pregnant or trying to get pregnant breast-feeding How should I use this medication? This medicine is for external use only. Do not take by mouth. Follow the directions on the label. Wash hands before and after use. Apply this medicine using the provided brush to cover the entire toenail. Do not use your medicine more often than directed. Finish the full course prescribed by your doctor or health care professional even if you think your condition is better. Do not stop using except on the advice of your doctor or health care professional. Talk to your pediatrician regarding the use of this medicine in children. While this drug may be prescribed for children as young as 6 years for selected conditions, precautions do apply. Overdosage: If you think you have taken too much of this medicine contact a poison control center or emergency room at once. NOTE: This medicine is only for you. Do not share this medicine with others. What if I miss a dose? If you miss a dose, use it as soon as you can. If it is almost time for your next dose, use only that dose. Do not use double or extra doses. What may interact with this medication? Interactions have not been studied. Do not use any other nail products (i.e., nail polish, pedicures) during treatment with this medicine. This list may not describe all possible interactions. Give your health care provider a list of all the  medicines, herbs, non-prescription drugs, or dietary supplements you use. Also tell them if you smoke, drink alcohol, or use illegal drugs. Some items may interact with your medicine. What should I watch for while using this medication? Do not get this medicine in your eyes. If you do, rinse out with plenty of cool tap water. Tell your doctor or health care professional if your symptoms do not start to get better or if they get worse. Wait for at least 10 minutes after bathing before applying this medication. After bathing, make sure that your feet are very dry. Fungal infections like moist conditions. Do not walk around barefoot. To help prevent reinfection, wear freshly washed cotton, not synthetic clothing. Tell your doctor or health care professional if you develop sores or blisters that do not heal properly. If your nail infection returns after you stop using this medicine, contact your doctor or health care professional. What side effects may I notice from receiving this medication? Side effects that you should report to your doctor or health care professional as soon as possible: allergic reactions like skin rash, itching or hives, swelling of the face, lips, or tongue ingrown toenail Side effects that usually do not require medical attention (report to your doctor or health care professional if they continue or are bothersome): mild skin irritation, burning, or itching This list may not describe all possible side effects. Call your doctor for medical advice about side effects. You may report side effects to FDA at 1-800-FDA-1088. Where should I keep my medication? Keep out of the  reach of children. Store at room temperature between 20 and 25 degrees C (68 and 77 degrees F). Keep this medicine in the original container. Throw away any unused medicine after the expiration date. This medicine is flammable. Avoid exposure to heat, fire, flame, and smoking. NOTE: This sheet is a summary. It may  not cover all possible information. If you have questions about this medicine, talk to your doctor, pharmacist, or health care provider.  2022 Elsevier/Gold Standard (2019-03-28 00:00:00)

## 2022-01-27 NOTE — Progress Notes (Signed)
Subjective: 71 year old female presents the office today for follow-up evaluation after undergoing total nail avulsion to her left hallux, biopsy.  She states that she is doing well.  No swelling redness or any drainage.  No fevers or chills.  No other concerns.  Objective: AAO x3, NAD DP/PT pulses palpable bilaterally, CRT less than 3 seconds Status post toenail avulsion left hallux.  Scab is formed.  There is no drainage or pus.  No significant pain.  No edema, erythema. No pain with calf compression, swelling, warmth, erythema  Assessment: Status post left total nail avulsion, biopsy  Plan: -All treatment options discussed with the patient including all alternatives, risks, complications.  -Reviewed the pathology with her.  Likely benign pigment change.  She has thickened discoloration of the nails as well. Will start topical Jublia.  She was told about oral medication.  Discussed that as the nail starts to grow back and on the left side she can apply the topical medication as well. -Patient encouraged to call the office with any questions, concerns, change in symptoms.   Trula Slade DPM

## 2022-01-29 ENCOUNTER — Other Ambulatory Visit: Payer: Self-pay | Admitting: Podiatry

## 2022-01-29 MED ORDER — TAVABOROLE 5 % EX SOLN: 1.0000 "application " | Freq: Every day | CUTANEOUS | 2 refills | Status: DC

## 2022-01-29 NOTE — Progress Notes (Signed)
Jublia not covered. I have sent tavaborole ?

## 2022-02-04 ENCOUNTER — Other Ambulatory Visit: Payer: Self-pay | Admitting: Internal Medicine

## 2022-02-04 DIAGNOSIS — K2100 Gastro-esophageal reflux disease with esophagitis, without bleeding: Secondary | ICD-10-CM

## 2022-02-04 DIAGNOSIS — E039 Hypothyroidism, unspecified: Secondary | ICD-10-CM

## 2022-02-04 DIAGNOSIS — K219 Gastro-esophageal reflux disease without esophagitis: Secondary | ICD-10-CM

## 2022-02-04 NOTE — Progress Notes (Signed)
[facility] Phone: [phone] Subjective:   [name], am serving as a scribe for Dr. Hulan Saas.  This visit occurred during the SARS-CoV-2 public health emergency.  Safety protocols were in place, including screening questions prior to the visit, additional usage of staff PPE, and extensive cleaning of exam room while observing appropriate contact time as indicated for disinfecting solutions.   I'm seeing this patient by the request  of:  [name], MD  CC: left knee pain   MAU:[id number]  12/19/20202 Patient given injection and tolerated the procedure well, discussed icing regimen and home exercise, discussed which activities to do which wants to avoid.  Increase activity slowly.  Follow-up with me again in 6 to 8 weeks  Update 02/08/2022 [name] is a 71 y.o. female coming in with complaint of L knee pain. Patient states that she has been having pain in anterior portion of knee. Also mentions pain in back calf as she had a cyst that seems to wax and wane.        Past Medical History:  Diagnosis Date   Anemia    Arthritis    Cardiomyopathy ([place])    Cholelithiasis    Chronic back pain    Constipation    Esophageal reflux    Fibromyalgia    Hemorrhoids    Hyperlipidemia    Hypertension    Hypothyroid    IBS (irritable bowel syndrome)    Internal hemorrhoids without mention of complication    Left leg pain    Myocardial infarction [facility])    Personal history of colonic polyps 05/06/2010   ADENOMATOUS POLYP   Pneumonia    Tubular adenoma of colon    Past Surgical History:  Procedure Laterality Date   ABDOMINAL HYSTERECTOMY     CERVICAL DISC SURGERY     PLATE IN NECK & BACK   CHOLECYSTECTOMY     COLONOSCOPY  09/27/2011   NORMAL    COLONOSCOPY WITH PROPOFOL N/A 10/06/2020   Procedure: COLONOSCOPY WITH PROPOFOL;  Surgeon: Irving Copas., MD;  Location: [facility] ENDOSCOPY;   Service: Gastroenterology;  Laterality: N/A;  ultra slim scope    LEFT HEART CATH AND CORONARY ANGIOGRAPHY N/A 05/08/2018   Procedure: LEFT HEART CATH AND CORONARY ANGIOGRAPHY;  Surgeon: Belva Crome, MD;  Location: [facility] LAB;  Service: Cardiovascular;  Laterality: N/A;   LUMBAR DISC SURGERY     POLYPECTOMY  10/06/2020   Procedure: POLYPECTOMY;  Surgeon: Mansouraty, Telford Nab., MD;  Location: [facility] ENDOSCOPY;  Service: Gastroenterology;;   TONSILLECTOMY     Social History   Socioeconomic History   Marital status: Widowed    Spouse name: Not on file   Number of children: 2   Years of education: Not on file   Highest education level: Not on file  Occupational History   Occupation: housewife    Employer: UNEMPLOYED   Tobacco Use   Smoking status: Never   Smokeless tobacco: Never  Vaping Use   Vaping Use: Never used  Substance and Sexual Activity   Alcohol use: [name]    Alcohol/week: 0.0 standard drinks   Drug use: [name]   Sexual activity: Not Currently  Other Topics Concern   Not on file  Social History Narrative   Daily Caffeine Use   Social Determinants of Health   Financial Resource Strain: Low Risk    Difficulty of Paying Living Expenses: Not hard at all  Food Insecurity: [name] Food Insecurity   Worried About Charity fundraiser in the Last Year: Never true   Ran Out of Food in the Last Year: Never true  Transportation Needs: [name] Transportation Needs   Lack of Transportation (Medical): [name]   Lack of Transportation (Non-Medical): [name]  Physical Activity: Inactive   Days of Exercise per Week: 0 days   Minutes of Exercise per Session: 0 min  Stress: [name] Stress Concern Present   Feeling of Stress : Not at all  Social Connections: Moderately Isolated   Frequency of Communication with Friends and Family: Twice a week   Frequency of Social Gatherings with Friends and Family: Twice a week   Attends Religious Services: More than 4 times per year   Active Member of [employer] or  Organizations: [name]   Attends Archivist Meetings: Never   Marital Status: Widowed   Allergies  Allergen Reactions   Aspirin Rash    unknown   Gadolinium Hives    patient unsure of which kind of dye she had a reaction to until reaction to Magnevist - may be allergic to x-ray contrast, Onset Date: 69678938    Iohexol Hives    patient unsure of which kind of dye she had a reaction to until reaction to Magnevist - may be allergic to x-ray contrast, Onset Date: 10175102   Livalo [Pitavastatin] Other (See Comments)    myalgias   Hydrocodone-Acetaminophen Rash   Lipitor [Atorvastatin] Other (See Comments)    Myalgias   Family History  Problem Relation Age of Onset   Diabetes Mother        aunts and uncles   Kidney disease Mother        stage 3   Lung disease Mother        colapsed lung/in hospice   COPD Mother    Hypertension Mother    Dementia Mother    Breast cancer Maternal Aunt    Heart failure Father        Mother   Breast cancer Cousin    Colon cancer Neg Hx    Esophageal cancer Neg Hx    Stomach cancer Neg Hx    Rectal cancer Neg Hx     Current Outpatient Medications (Endocrine & Metabolic):    levothyroxine (SYNTHROID) 100 MCG tablet, TAKE ONE TABLET BY MOUTH ONCE DAILY  Current Outpatient Medications (Cardiovascular):    amLODipine (NORVASC) 5 MG tablet, TAKE ONE TABLET BY MOUTH EVERY MORNING   carvedilol (COREG) 6.25 MG tablet, TAKE ONE TABLET BY MOUTH EVERY MORNING and TAKE ONE TABLET BY MOUTH EVERYDAY AT BEDTIME   isosorbide mononitrate (IMDUR) 30 MG 24 hr tablet, TAKE ONE TABLET BY MOUTH EVERY MORNING   losartan (COZAAR) 25 MG tablet, TAKE ONE TABLET BY MOUTH EVERY MORNING   nitroGLYCERIN (NITROSTAT) 0.4 MG SL tablet, Place 1 tablet (0.4 mg total) under the tongue every 5 (five) minutes as needed for chest pain.   rosuvastatin (CRESTOR) 10 MG tablet, TAKE ONE TABLET BY MOUTH EVERYDAY AT BEDTIME  Current Outpatient Medications (Respiratory):     levocetirizine (XYZAL) 5 MG tablet, TAKE ONE TABLET BY MOUTH EVERYDAY AT BEDTIME   Current Outpatient Medications (Hematological):    clopidogrel (PLAVIX) 75 MG tablet, TAKE ONE TABLET BY MOUTH EVERY MORNING  Current Outpatient Medications (Other):    cephALEXin (KEFLEX) 500 MG capsule, Take 1 capsule (500 mg total) by mouth 3 (three) times daily.   famotidine (PEPCID) 40 MG tablet, TAKE ONE TABLET BY MOUTH  EVERY MORNING   linaclotide (LINZESS) 145 MCG CAPS capsule, Take 1 capsule (145 mcg total) by mouth daily before breakfast.   magnesium oxide (MAG-OX) 400 MG tablet, Take 400 mg by mouth daily.   pantoprazole (PROTONIX) 20 MG tablet, TAKE ONE TABLET BY MOUTH EVERY MORNING   Tavaborole 5 % SOLN, Apply 1 application topically daily.   Vitamin D, Ergocalciferol, (DRISDOL) 1.25 MG (50000 UNIT) CAPS capsule, TAKE ONE CAPSULE BY MOUTH ONCE WEEKLY ON SATURDAY   Reviewed prior external information including notes and imaging from  primary care provider As well as notes that were available from care everywhere and other healthcare systems.  Past medical history, social, surgical and family history all reviewed in electronic medical record.  [name] pertanent information unless stated regarding to the chief complaint.   Review of Systems:  [name] headache, visual changes, nausea, vomiting, diarrhea, constipation, dizziness, abdominal pain, skin rash, fevers, chills, night sweats, weight loss, swollen lymph nodes, body aches,  chest pain, shortness of breath, mood changes. POSITIVE muscle aches, joint swelling  Objective  Blood pressure 128/72, pulse 66, height 5' 7"  (1.702 m), weight 187 lb (84.8 kg), SpO2 98 %.   General: [name] apparent distress alert and oriented x3 mood and affect normal, dressed appropriately.  HEENT: Pupils equal, extraocular movements intact  Respiratory: Patient's speak in full sentences and does not appear short of breath  MSK: Antalgic gait noted.  Favoring the left knee.   Patient does have mild swelling noted of the Baker's cyst.  Does lack the last 10 degrees of flexion.  Instability with valgus and varus stress.    After informed written and verbal consent, patient was seated on exam table. Left knee was prepped with alcohol swab and utilizing anterolateral approach, patient's left knee space was injected with 4:1  marcaine 0.5%: Kenalog 43m/dL. Patient tolerated the procedure well without immediate complications.   Impression and Recommendations:     The above documentation has been reviewed and is accurate and complete ZLyndal Pulley DO

## 2022-02-08 ENCOUNTER — Encounter: Payer: Self-pay | Admitting: Family Medicine

## 2022-02-08 ENCOUNTER — Other Ambulatory Visit: Payer: Self-pay

## 2022-02-08 ENCOUNTER — Ambulatory Visit (INDEPENDENT_AMBULATORY_CARE_PROVIDER_SITE_OTHER): Payer: Medicare Other | Admitting: Family Medicine

## 2022-02-08 DIAGNOSIS — M1712 Unilateral primary osteoarthritis, left knee: Secondary | ICD-10-CM

## 2022-02-08 NOTE — Assessment & Plan Note (Addendum)
Repeat injection given today and tolerated the procedure well.  Discussed icing regimen and home exercise, which activities to do and which ones to avoid.  Increase activity slowly.  Chronic problem with exacerbation. ?Be a candidate for viscosupplementation.  Follow-up 3 months ?

## 2022-02-08 NOTE — Patient Instructions (Addendum)
Injected knee today ?Lets see you back in 12 weeks ?

## 2022-02-11 ENCOUNTER — Other Ambulatory Visit: Payer: Self-pay | Admitting: Internal Medicine

## 2022-02-12 ENCOUNTER — Other Ambulatory Visit: Payer: Self-pay | Admitting: Internal Medicine

## 2022-02-25 ENCOUNTER — Other Ambulatory Visit: Payer: Self-pay | Admitting: Internal Medicine

## 2022-03-15 ENCOUNTER — Ambulatory Visit (INDEPENDENT_AMBULATORY_CARE_PROVIDER_SITE_OTHER): Payer: Medicare Other | Admitting: Internal Medicine

## 2022-03-15 ENCOUNTER — Encounter: Payer: Self-pay | Admitting: Internal Medicine

## 2022-03-15 ENCOUNTER — Telehealth: Payer: Medicare Other

## 2022-03-15 VITALS — BP 134/76 | HR 68 | Temp 98.2°F | Resp 16 | Ht 67.0 in | Wt 185.0 lb

## 2022-03-15 DIAGNOSIS — E039 Hypothyroidism, unspecified: Secondary | ICD-10-CM

## 2022-03-15 DIAGNOSIS — I10 Essential (primary) hypertension: Secondary | ICD-10-CM | POA: Diagnosis not present

## 2022-03-15 DIAGNOSIS — M1712 Unilateral primary osteoarthritis, left knee: Secondary | ICD-10-CM | POA: Diagnosis not present

## 2022-03-15 DIAGNOSIS — M5136 Other intervertebral disc degeneration, lumbar region: Secondary | ICD-10-CM | POA: Diagnosis not present

## 2022-03-15 DIAGNOSIS — E559 Vitamin D deficiency, unspecified: Secondary | ICD-10-CM

## 2022-03-15 DIAGNOSIS — M1812 Unilateral primary osteoarthritis of first carpometacarpal joint, left hand: Secondary | ICD-10-CM | POA: Diagnosis not present

## 2022-03-15 DIAGNOSIS — F411 Generalized anxiety disorder: Secondary | ICD-10-CM

## 2022-03-15 DIAGNOSIS — E118 Type 2 diabetes mellitus with unspecified complications: Secondary | ICD-10-CM

## 2022-03-15 DIAGNOSIS — Z23 Encounter for immunization: Secondary | ICD-10-CM

## 2022-03-15 LAB — VITAMIN D 25 HYDROXY (VIT D DEFICIENCY, FRACTURES): VITD: 44.41 ng/mL (ref 30.00–100.00)

## 2022-03-15 LAB — HEMOGLOBIN A1C: Hgb A1c MFr Bld: 6.5 % (ref 4.6–6.5)

## 2022-03-15 LAB — TSH: TSH: 3.38 u[IU]/mL (ref 0.35–5.50)

## 2022-03-15 MED ORDER — SHINGRIX 50 MCG/0.5ML IM SUSR: 0.5000 mL | Freq: Once | INTRAMUSCULAR | 1 refills | Status: AC

## 2022-03-15 MED ORDER — VITAMIN D (ERGOCALCIFEROL) 1.25 MG (50000 UNIT) PO CAPS: 50000.0000 [IU] | ORAL_CAPSULE | ORAL | 0 refills | Status: DC

## 2022-03-15 NOTE — Patient Instructions (Signed)

## 2022-03-15 NOTE — Progress Notes (Signed)
? ?Subjective:  ?Patient ID: [name], female    DOB: [birth year]  Age: 71 y.o. MRN: [id number] ? ?CC: Diabetes, Hypothyroidism, and Hypertension ? ? ?HPI ?[name] presents for f/up -  ? ?She is active and denies DOE, SOB, CP, or edema. ? ?Outpatient Medications Prior to Visit  ?Medication Sig Dispense Refill  ? amLODipine (NORVASC) 5 MG tablet TAKE ONE TABLET BY MOUTH EVERY MORNING 90 tablet 3  ? carvedilol (COREG) 6.25 MG tablet TAKE ONE TABLET BY MOUTH EVERY MORNING and TAKE ONE TABLET BY MOUTH EVERYDAY AT BEDTIME 180 tablet 3  ? clopidogrel (PLAVIX) 75 MG tablet TAKE ONE TABLET BY MOUTH EVERY MORNING 90 tablet 1  ? famotidine (PEPCID) 40 MG tablet TAKE ONE TABLET BY MOUTH EVERY MORNING 90 tablet 1  ? isosorbide mononitrate (IMDUR) 30 MG 24 hr tablet TAKE ONE TABLET BY MOUTH EVERY MORNING 90 tablet 1  ? levocetirizine (XYZAL) 5 MG tablet TAKE ONE TABLET BY MOUTH EVERYDAY AT BEDTIME 90 tablet 1  ? levothyroxine (SYNTHROID) 100 MCG tablet TAKE ONE TABLET BY MOUTH ONCE DAILY 90 tablet 0  ? linaclotide (LINZESS) 145 MCG CAPS capsule Take 1 capsule (145 mcg total) by mouth daily before breakfast. 90 capsule 1  ? losartan (COZAAR) 25 MG tablet TAKE ONE TABLET BY MOUTH EVERY MORNING 90 tablet 1  ? magnesium oxide (MAG-OX) 400 MG tablet Take 400 mg by mouth daily.    ? nitroGLYCERIN (NITROSTAT) 0.4 MG SL tablet Place 1 tablet (0.4 mg total) under the tongue every 5 (five) minutes as needed for chest pain. 25 tablet 6  ? pantoprazole (PROTONIX) 20 MG tablet TAKE ONE TABLET BY MOUTH EVERY MORNING 90 tablet 0  ? rosuvastatin (CRESTOR) 10 MG tablet TAKE ONE TABLET BY MOUTH EVERYDAY AT BEDTIME 90 tablet 1  ? Tavaborole 5 % SOLN Apply 1 application topically daily. 10 mL 2  ? cephALEXin (KEFLEX) 500 MG capsule Take 1 capsule (500 mg total) by mouth 3 (three) times daily. 21 capsule 0  ? Vitamin D, Ergocalciferol, (DRISDOL) 1.25 MG (50000 UNIT) CAPS capsule TAKE ONE CAPSULE BY MOUTH ONCE WEEKLY ON SATURDAY 4 capsule 0   ? ?No facility-administered medications prior to visit.  ? ? ?ROS ?Review of Systems  ?Constitutional: Negative.   ?HENT: Negative.    ?Eyes: Negative.   ?Respiratory:  Negative for cough, shortness of breath and wheezing.   ?Cardiovascular:  Negative for chest pain, palpitations and leg swelling.  ?Gastrointestinal:  Negative for abdominal pain, constipation, diarrhea and nausea.  ?Endocrine: Negative.   ?Genitourinary: Negative.  Negative for difficulty urinating.  ?Musculoskeletal:  Positive for arthralgias. Negative for back pain and myalgias.  ?Skin: Negative.   ?Neurological:  Negative for dizziness, weakness, light-headedness and headaches.  ?Hematological:  Negative for adenopathy. Does not bruise/bleed easily.  ?Psychiatric/Behavioral:  Negative for confusion, decreased concentration, dysphoric mood, hallucinations, sleep disturbance and suicidal ideas. The patient is nervous/anxious. The patient is not hyperactive.   ? ?Objective:  ?BP 134/76 (BP Location: Left Arm, Patient Position: Sitting, Cuff Size: Large)   Pulse 68   Temp 98.2 ?F (36.8 ?C) (Oral)   Resp 16   Ht 5' 7"  (1.702 m)   Wt 185 lb (83.9 kg)   SpO2 95%   BMI 28.98 kg/m?  ? ?BP Readings from Last 3 Encounters:  ?03/15/22 134/76  ?02/08/22 128/72  ?12/14/21 132/82  ? ? ?Wt Readings from Last 3 Encounters:  ?03/15/22 185 lb (83.9 kg)  ?02/08/22 187 lb (84.8 kg)  ?  12/14/21 183 lb (83 kg)  ? ? ?Physical Exam ?Vitals reviewed.  ?Constitutional:   ?   Appearance: She is not diaphoretic.  ?HENT:  ?   Nose: Nose normal.  ?   Mouth/Throat:  ?   Mouth: Mucous membranes are moist.  ?Eyes:  ?   General: No scleral icterus. ?   Conjunctiva/sclera: Conjunctivae normal.  ?Cardiovascular:  ?   Rate and Rhythm: Normal rate and regular rhythm.  ?   Heart sounds: No murmur heard. ?Pulmonary:  ?   Effort: Pulmonary effort is normal.  ?   Breath sounds: No stridor. No wheezing, rhonchi or rales.  ?Abdominal:  ?   General: Abdomen is flat.  ?   Palpations:  There is no mass.  ?   Tenderness: There is no abdominal tenderness. There is no guarding.  ?   Hernia: No hernia is present.  ?Musculoskeletal:     ?   General: Deformity (djd) present. No swelling or tenderness.  ?   Cervical back: Neck supple.  ?   Right lower leg: No edema.  ?   Left lower leg: No edema.  ?Lymphadenopathy:  ?   Cervical: No cervical adenopathy.  ?Skin: ?   General: Skin is warm.  ?Neurological:  ?   General: No focal deficit present.  ?   Mental Status: She is alert.  ?Psychiatric:     ?   Mood and Affect: Mood normal.     ?   Behavior: Behavior normal.     ?   Thought Content: Thought content normal.     ?   Judgment: Judgment normal.  ? ? ?Lab Results  ?Component Value Date  ? WBC 7.0 12/14/2021  ? HGB 12.3 12/14/2021  ? HCT 37.4 12/14/2021  ? PLT 301.0 12/14/2021  ? GLUCOSE 87 12/14/2021  ? CHOL 116 12/14/2021  ? TRIG 92.0 12/14/2021  ? HDL 53.40 12/14/2021  ? LDLDIRECT 88.0 10/19/2016  ? LDLCALC 44 12/14/2021  ? ALT 10 12/14/2021  ? AST 14 12/14/2021  ? NA 140 12/14/2021  ? K 4.0 12/14/2021  ? CL 103 12/14/2021  ? CREATININE 0.74 12/14/2021  ? BUN 13 12/14/2021  ? CO2 29 12/14/2021  ? TSH 3.38 03/15/2022  ? INR 1.01 05/08/2018  ? HGBA1C 6.5 03/15/2022  ? ? ?VAS [place] LOWER EXTREMITY VENOUS (DVT) ? ?Result Date: 12/17/2021 ? Lower Venous DVT Study Patient Name:  [name]  Date of Exam:   12/17/2021 Medical Rec #: [id number]     Accession #:    [id number] Date of Birth: [birth year]     Patient Gender: F Patient Age:   50 years Exam Location:  [facility] Procedure:      VAS [place] LOWER EXTREMITY VENOUS (DVT) Referring Phys: [name] --------------------------------------------------------------------------------  Indications: Swelling left calf x 1 week Other Indications: CAD, HTN,. Comparison Study: No prior exam Performing Technologist: Alvia Grove RVT  Examination Guidelines: A complete evaluation includes B-mode imaging, spectral Doppler, color Doppler, and power Doppler as  needed of all accessible portions of each vessel. Bilateral testing is considered an integral part of a complete examination. Limited examinations for reoccurring indications may be performed as noted. The reflux portion of the exam is performed with the patient in reverse Trendelenburg.  +-----+---------------+---------+-----------+----------+--------------+ RIGHTCompressibilityPhasicitySpontaneityPropertiesThrombus Aging +-----+---------------+---------+-----------+----------+--------------+ CFV  Full           Yes      Yes                                 +-----+---------------+---------+-----------+----------+--------------+   +---------+---------------+---------+-----------+----------+--------------+  LEFT     CompressibilityPhasicitySpontaneityPropertiesThrombus Aging +---------+---------------+---------+-----------+----------+--------------+ CFV      Full           Yes      Yes                                 +---------+---------------+---------+-----------+----------+--------------+ SFJ      Full           Yes      Yes                                 +---------+---------------+---------+-----------+----------+--------------+ FV Prox  Full           Yes      Yes                                 +---------+---------------+---------+-----------+----------+--------------+ FV Mid   Full           Yes      Yes                                 +---------+---------------+---------+-----------+----------+--------------+ FV DistalFull           Yes      Yes                                 +---------+---------------+---------+-----------+----------+--------------+ PFV      Full           Yes      Yes                                 +---------+---------------+---------+-----------+----------+--------------+ POP      Full           Yes      Yes                                 +---------+---------------+---------+-----------+----------+--------------+ PTV       Full           Yes      Yes                                 +---------+---------------+---------+-----------+----------+--------------+ PERO     Full           Yes      Yes                                 +---------+---------------+---------+-----------+----------+-----------

## 2022-03-16 ENCOUNTER — Encounter: Payer: Self-pay | Admitting: Internal Medicine

## 2022-03-17 ENCOUNTER — Telehealth: Payer: Self-pay

## 2022-03-17 MED ORDER — CLONAZEPAM 0.5 MG PO TABS
0.5000 mg | ORAL_TABLET | Freq: Two times a day (BID) | ORAL | 1 refills | Status: AC | PRN
Start: 2022-03-17 — End: ?

## 2022-03-17 MED ORDER — TRAMADOL HCL 50 MG PO TABS: 50.0000 mg | ORAL_TABLET | Freq: Two times a day (BID) | ORAL | 1 refills | Status: DC | PRN

## 2022-03-17 NOTE — Telephone Encounter (Signed)
Pt is calling for an update on the 2 medications that she was suppose to start. Pt says one was for Anxiety and the other is for arthritis.  ? ?Please advise ?

## 2022-03-19 NOTE — Telephone Encounter (Signed)
noted 

## 2022-03-19 NOTE — Telephone Encounter (Signed)
I advised the pt that the Rx that was discussed during visit was sent to Pharmacy. ? ?FYI ?

## 2022-04-29 ENCOUNTER — Other Ambulatory Visit: Payer: Self-pay | Admitting: Internal Medicine

## 2022-04-29 ENCOUNTER — Other Ambulatory Visit: Payer: Self-pay | Admitting: Cardiovascular Disease

## 2022-04-29 DIAGNOSIS — J302 Other seasonal allergic rhinitis: Secondary | ICD-10-CM

## 2022-04-29 DIAGNOSIS — M542 Cervicalgia: Secondary | ICD-10-CM | POA: Diagnosis not present

## 2022-05-04 ENCOUNTER — Ambulatory Visit: Payer: Medicare Other | Admitting: Family Medicine

## 2022-05-10 ENCOUNTER — Other Ambulatory Visit: Payer: Self-pay | Admitting: Internal Medicine

## 2022-05-10 ENCOUNTER — Encounter: Payer: Self-pay | Admitting: Internal Medicine

## 2022-05-10 ENCOUNTER — Ambulatory Visit
Admission: RE | Admit: 2022-05-10 | Discharge: 2022-05-10 | Disposition: A | Discharge status: Home / Self Care | Payer: Medicare Other | Source: Ambulatory Visit | Attending: Internal Medicine | Admitting: Internal Medicine

## 2022-05-10 DIAGNOSIS — Z0000 Encounter for general adult medical examination without abnormal findings: Secondary | ICD-10-CM

## 2022-05-10 DIAGNOSIS — Z780 Asymptomatic menopausal state: Secondary | ICD-10-CM | POA: Diagnosis not present

## 2022-05-10 DIAGNOSIS — K219 Gastro-esophageal reflux disease without esophagitis: Secondary | ICD-10-CM

## 2022-05-10 DIAGNOSIS — E039 Hypothyroidism, unspecified: Secondary | ICD-10-CM

## 2022-05-13 DIAGNOSIS — M1991 Primary osteoarthritis, unspecified site: Secondary | ICD-10-CM | POA: Diagnosis not present

## 2022-05-13 DIAGNOSIS — R768 Other specified abnormal immunological findings in serum: Secondary | ICD-10-CM | POA: Diagnosis not present

## 2022-05-20 ENCOUNTER — Other Ambulatory Visit: Payer: Self-pay | Admitting: Student

## 2022-05-20 DIAGNOSIS — M542 Cervicalgia: Secondary | ICD-10-CM

## 2022-05-22 ENCOUNTER — Ambulatory Visit
Admission: RE | Admit: 2022-05-22 | Discharge: 2022-05-22 | Disposition: A | Discharge status: Home / Self Care | Payer: Medicare Other | Source: Ambulatory Visit | Attending: Student | Admitting: Student

## 2022-05-22 DIAGNOSIS — M542 Cervicalgia: Secondary | ICD-10-CM

## 2022-06-09 ENCOUNTER — Telehealth: Payer: Self-pay | Admitting: Podiatry

## 2022-06-09 ENCOUNTER — Other Ambulatory Visit: Payer: Self-pay | Admitting: Podiatry

## 2022-06-09 MED ORDER — CEPHALEXIN 500 MG PO CAPS: 500.0000 mg | ORAL_CAPSULE | Freq: Three times a day (TID) | ORAL | 0 refills | Status: DC

## 2022-06-09 NOTE — Telephone Encounter (Signed)
Pt is asking for a Rx for antibiotics for her great toe that's infected from nail removal.  Please advise.

## 2022-06-10 ENCOUNTER — Ambulatory Visit (INDEPENDENT_AMBULATORY_CARE_PROVIDER_SITE_OTHER): Payer: Medicare Other | Admitting: Podiatry

## 2022-06-10 DIAGNOSIS — L819 Disorder of pigmentation, unspecified: Secondary | ICD-10-CM

## 2022-06-10 DIAGNOSIS — B351 Tinea unguium: Secondary | ICD-10-CM

## 2022-06-17 DIAGNOSIS — M542 Cervicalgia: Secondary | ICD-10-CM | POA: Diagnosis not present

## 2022-06-17 NOTE — Progress Notes (Signed)
Subjective: 71 year old female presents the office today for concerns of her left big toenail becoming infected.  She called the office stating that the nail had come in discolored and she is having some tenderness and she is concerned about infection.  I started her on Keflex yesterday and she presents today for evaluation.  Upon further questioning she has not seen any drainage or pus.  No recent injuries   Objective: AAO x3, NAD DP/PT pulses palpable bilaterally, CRT less than 3 seconds Left hallux nail is dystrophic with darkened discoloration along the borders there is no extensively hyperpigmentation of the surrounding skin.  No edema, erythema.  There is no drainage or pus. No pain with calf compression, swelling, warmth, erythema     Assessment: Left hallux onychodystrophy  Plan: -All treatment options discussed with the patient including all alternatives, risks, complications.  -Previously performed biopsy which came back as a melanotic macule.  No evidence of malignancy.  Dark discoloration likely due to pigment change. -Patient prescribed Sherald Hess but not able to get this.  Discussed Penlac.  For now finish course of antibiotics as a precaution although I doubt infection.  Discussed urea nail gel to help the dystrophy of the nail.  -Patient encouraged to call the office with any questions, concerns, change in symptoms.    Trula Slade DPM

## 2022-07-08 DIAGNOSIS — Z9189 Other specified personal risk factors, not elsewhere classified: Secondary | ICD-10-CM | POA: Diagnosis not present

## 2022-07-12 ENCOUNTER — Ambulatory Visit: Payer: Medicare Other | Admitting: Podiatry

## 2022-07-21 ENCOUNTER — Encounter: Payer: Self-pay | Admitting: Family Medicine

## 2022-07-21 ENCOUNTER — Ambulatory Visit (INDEPENDENT_AMBULATORY_CARE_PROVIDER_SITE_OTHER): Payer: Medicare Other | Admitting: Family Medicine

## 2022-07-21 VITALS — BP 126/82 | HR 70 | Temp 97.6°F | Ht 67.0 in | Wt 190.0 lb

## 2022-07-21 DIAGNOSIS — H9042 Sensorineural hearing loss, unilateral, left ear, with unrestricted hearing on the contralateral side: Secondary | ICD-10-CM

## 2022-07-21 NOTE — Assessment & Plan Note (Signed)
New referral to ENT placed per patient request due to Dr. Pollie Friar office closing.  Reviewed notes from Dr. Lucia Gaskins.  Exam is benign.  Follow-up with PCP as recommended.

## 2022-07-21 NOTE — Progress Notes (Signed)
Subjective:     Patient ID: [name], female    DOB: 1951-05-10, 71 y.o.   MRN: [id number]  Chief Complaint  Patient presents with   Referral    ENT referral for trouble with her left ear. Has had one previously and just needs a new one.     HPI Patient is in today for left ear concerns. States since having Covid she has had issues with her left ear. Complains of Intermittent hearing loss.   States her ENT is no longer open. She was seeing Dr. Lucia Gaskins.   States she thinks she may need a hearing aid in her left ear and Dr Lucia Gaskins told her he could help get her one.    Denies fever, chills, dizziness, headache, URI symptoms, chest pain, palpitations, shortness of breath, abdominal pain, N/V/D    Health Maintenance Due  Topic Date Due   Diabetic kidney evaluation - Urine ACR  Never done   Zoster Vaccines- Shingrix (1 of 2) Never done   OPHTHALMOLOGY EXAM  02/05/2022    Past Medical History:  Diagnosis Date   Anemia    Arthritis    Cardiomyopathy ([place])    Cholelithiasis    Chronic back pain    Constipation    Esophageal reflux    Fibromyalgia    Hemorrhoids    Hyperlipidemia    Hypertension    Hypothyroid    IBS (irritable bowel syndrome)    Internal hemorrhoids without mention of complication    Left leg pain    Myocardial infarction [facility])    Personal history of colonic polyps 05/06/2010   ADENOMATOUS POLYP   Pneumonia    Tubular adenoma of colon     Past Surgical History:  Procedure Laterality Date   ABDOMINAL HYSTERECTOMY     CERVICAL DISC SURGERY     PLATE IN NECK & BACK   CHOLECYSTECTOMY     COLONOSCOPY  09/27/2011   NORMAL    COLONOSCOPY WITH PROPOFOL N/A 10/06/2020   Procedure: COLONOSCOPY WITH PROPOFOL;  Surgeon: Irving Copas., MD;  Location: [facility] ENDOSCOPY;  Service: Gastroenterology;  Laterality: N/A;  ultra slim scope    LEFT HEART CATH AND CORONARY ANGIOGRAPHY N/A 05/08/2018   Procedure: LEFT HEART CATH AND CORONARY ANGIOGRAPHY;   Surgeon: Belva Crome, MD;  Location: [place] CV LAB;  Service: Cardiovascular;  Laterality: N/A;   LUMBAR DISC SURGERY     POLYPECTOMY  10/06/2020   Procedure: POLYPECTOMY;  Surgeon: Mansouraty, Telford Nab., MD;  Location: [facility] ENDOSCOPY;  Service: Gastroenterology;;   TONSILLECTOMY      Family History  Problem Relation Age of Onset   Diabetes Mother        aunts and uncles   Kidney disease Mother        stage 64   Lung disease Mother        colapsed lung/in hospice   COPD Mother    Hypertension Mother    Dementia Mother    Breast cancer Maternal Aunt    Heart failure Father        Mother   Breast cancer Cousin    Colon cancer Neg Hx    Esophageal cancer Neg Hx    Stomach cancer Neg Hx    Rectal cancer Neg Hx     Social History   Socioeconomic History   Marital status: Widowed    Spouse name: Not on file   Number of children: 2   Years of education: Not on  file   Highest education level: Not on file  Occupational History   Occupation: housewife    Employer: UNEMPLOYED   Tobacco Use   Smoking status: Never    Passive exposure: Never   Smokeless tobacco: Never  Vaping Use   Vaping Use: Never used  Substance and Sexual Activity   Alcohol use: No    Alcohol/week: 0.0 standard drinks of alcohol   Drug use: No   Sexual activity: Not Currently  Other Topics Concern   Not on file  Social History Narrative   Daily Caffeine Use   Social Determinants of Health   Financial Resource Strain: Low Risk  (12/11/2021)   Overall Financial Resource Strain (CARDIA)    Difficulty of Paying Living Expenses: Not hard at all  Food Insecurity: No Food Insecurity (12/11/2021)   Hunger Vital Sign    Worried About Running Out of Food in the Last Year: Never true    Ran Out of Food in the Last Year: Never true  Transportation Needs: No Transportation Needs (12/11/2021)   PRAPARE - Hydrologist (Medical): No    Lack of Transportation (Non-Medical): No   Physical Activity: Inactive (12/11/2021)   Exercise Vital Sign    Days of Exercise per Week: 0 days    Minutes of Exercise per Session: 0 min  Stress: No Stress Concern Present (12/11/2021)   [place]    Feeling of Stress : Not at all  Social Connections: Moderately Isolated (12/11/2021)   Social Connection and Isolation Panel [NHANES]    Frequency of Communication with Friends and Family: Twice a week    Frequency of Social Gatherings with Friends and Family: Twice a week    Attends Religious Services: More than 4 times per year    Active Member of [employer] or Organizations: No    Attends Archivist Meetings: Never    Marital Status: Widowed  Intimate Partner Violence: Not At Risk (12/11/2021)   Humiliation, Afraid, Rape, and Kick questionnaire    Fear of Current or Ex-Partner: No    Emotionally Abused: No    Physically Abused: No    Sexually Abused: No    Outpatient Medications Prior to Visit  Medication Sig Dispense Refill   amLODipine (NORVASC) 5 MG tablet TAKE ONE TABLET BY MOUTH EVERY MORNING 90 tablet 3   carvedilol (COREG) 6.25 MG tablet TAKE ONE TABLET BY MOUTH EVERY MORNING and TAKE ONE TABLET BY MOUTH EVERYDAY AT BEDTIME 180 tablet 3   cephALEXin (KEFLEX) 500 MG capsule Take 1 capsule (500 mg total) by mouth 3 (three) times daily. 21 capsule 0   clonazePAM (KLONOPIN) 0.5 MG tablet Take 1 tablet (0.5 mg total) by mouth 2 (two) times daily as needed for anxiety. 180 tablet 1   clopidogrel (PLAVIX) 75 MG tablet Take 1 tablet (75 mg total) by mouth every morning. Please call [phone] to schedule an appointment for future refills. Thank you. 90 tablet 0   famotidine (PEPCID) 40 MG tablet TAKE ONE TABLET BY MOUTH EVERY MORNING 90 tablet 1   isosorbide mononitrate (IMDUR) 30 MG 24 hr tablet Take 1 tablet (30 mg total) by mouth every morning. Please call [phone] to schedule an appointment for future  refills. Thank you. 90 tablet 0   levocetirizine (XYZAL) 5 MG tablet TAKE ONE TABLET BY MOUTH EVERYDAY AT BEDTIME 90 tablet 1   levothyroxine (SYNTHROID) 100 MCG tablet TAKE ONE TABLET BY MOUTH BEFORE  BREAKFAST 90 tablet 0   linaclotide (LINZESS) 145 MCG CAPS capsule Take 1 capsule (145 mcg total) by mouth daily before breakfast. 90 capsule 1   losartan (COZAAR) 25 MG tablet Take 1 tablet (25 mg total) by mouth every morning. Please call [phone] to schedule an appointment for future refills. Thank you. 90 tablet 0   magnesium oxide (MAG-OX) 400 MG tablet Take 400 mg by mouth daily.     nitroGLYCERIN (NITROSTAT) 0.4 MG SL tablet Place 1 tablet (0.4 mg total) under the tongue every 5 (five) minutes as needed for chest pain. 25 tablet 6   pantoprazole (PROTONIX) 20 MG tablet TAKE ONE TABLET BY MOUTH EVERY MORNING 90 tablet 0   rosuvastatin (CRESTOR) 10 MG tablet Take 1 tablet (10 mg total) by mouth at bedtime. Please call [phone] to schedule an appointment for future refills. Thank you. 90 tablet 0   Tavaborole 5 % SOLN Apply 1 application topically daily. 10 mL 2   traMADol (ULTRAM) 50 MG tablet Take 1 tablet (50 mg total) by mouth every 12 (twelve) hours as needed. 180 tablet 1   Vitamin D, Ergocalciferol, (DRISDOL) 1.25 MG (50000 UNIT) CAPS capsule Take 1 capsule (50,000 Units total) by mouth every 7 (seven) days. 12 capsule 0   No facility-administered medications prior to visit.    Allergies  Allergen Reactions   Aspirin Rash    unknown   Gadolinium Hives    patient unsure of which kind of dye she had a reaction to until reaction to Magnevist - may be allergic to x-ray contrast, Onset Date: [id number]    Iohexol Hives    patient unsure of which kind of dye she had a reaction to until reaction to Magnevist - may be allergic to x-ray contrast, Onset Date: [id number]   Livalo [Pitavastatin] Other (See Comments)    myalgias   Hydrocodone-Acetaminophen Rash   Lipitor [Atorvastatin]  Other (See Comments)    Myalgias    ROS     Objective:    Physical Exam  BP 126/82 (BP Location: Left Arm, Patient Position: Sitting, Cuff Size: Large)   Pulse 70   Temp 97.6 F (36.4 C) (Temporal)   Ht 5' 7"  (1.702 m)   Wt 190 lb (86.2 kg)   SpO2 97%   BMI 29.76 kg/m  Wt Readings from Last 3 Encounters:  07/21/22 190 lb (86.2 kg)  03/15/22 185 lb (83.9 kg)  02/08/22 187 lb (84.8 kg)   Alert and in no distress. Tympanic membranes and canals are normal. Pharyngeal area is normal. Neck is supple without adenopathy or thyromegaly.     Assessment & Plan:   Problem List Items Addressed This Visit       Nervous and Auditory   Sensorineural hearing loss (SNHL) of left ear with unrestricted hearing of right ear - Primary    New referral to ENT placed per patient request due to Dr. Pollie Friar office closing.  Reviewed notes from Dr. Lucia Gaskins.  Exam is benign.  Follow-up with PCP as recommended.      Relevant Orders   Ambulatory referral to ENT    I am having [name] maintain her linaclotide, nitroGLYCERIN, magnesium oxide, carvedilol, amLODipine, Tavaborole, famotidine, Vitamin D (Ergocalciferol), traMADol, clonazePAM, clopidogrel, isosorbide mononitrate, levocetirizine, rosuvastatin, losartan, pantoprazole, levothyroxine, and cephALEXin.  No orders of the defined types were placed in this encounter.

## 2022-07-21 NOTE — Patient Instructions (Signed)
I have referred you to [facility] on [address] in [place].  Let us know if you do not hear from them in the next 2-3 weeks please.

## 2022-07-23 ENCOUNTER — Other Ambulatory Visit: Payer: Self-pay | Admitting: Internal Medicine

## 2022-07-23 DIAGNOSIS — Z1231 Encounter for screening mammogram for malignant neoplasm of breast: Secondary | ICD-10-CM

## 2022-07-26 ENCOUNTER — Encounter: Payer: Self-pay | Admitting: Cardiovascular Disease

## 2022-07-26 ENCOUNTER — Ambulatory Visit: Payer: Medicare Other | Attending: Cardiovascular Disease | Admitting: Cardiovascular Disease

## 2022-07-26 VITALS — BP 110/72 | HR 70 | Ht 67.0 in | Wt 191.0 lb

## 2022-07-26 DIAGNOSIS — E782 Mixed hyperlipidemia: Secondary | ICD-10-CM

## 2022-07-26 DIAGNOSIS — I2510 Atherosclerotic heart disease of native coronary artery without angina pectoris: Secondary | ICD-10-CM

## 2022-07-26 DIAGNOSIS — I10 Essential (primary) hypertension: Secondary | ICD-10-CM | POA: Diagnosis not present

## 2022-07-26 DIAGNOSIS — I5181 Takotsubo syndrome: Secondary | ICD-10-CM | POA: Diagnosis not present
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
## 2022-07-26 NOTE — Patient Instructions (Signed)
Medication Instructions:  Your physician recommends that you continue on your current medications as directed. Please refer to the Current Medication list given to you today.  *If you need a refill on your cardiac medications before your next appointment, please call your pharmacy*   Lab Work: NONE If you have labs (blood work) drawn today and your tests are completely normal, you will receive your results only by: [place] (if you have MyChart) OR A paper copy in the mail If you have any lab test that is abnormal or we need to change your treatment, we will call you to review the results.   Testing/Procedures: NONE   Follow-Up: At [facility], you and your health needs are our priority.  As part of our continuing mission to provide you with exceptional heart care, we have created designated Provider Care Teams.  These Care Teams include your primary Cardiologist (physician) and Advanced Practice Providers (APPs -  Physician Assistants and Nurse Practitioners) who all work together to provide you with the care you need, when you need it.  We recommend signing up for the patient portal called "MyChart".  Sign up information is provided on this After Visit Summary.  MyChart is used to connect with patients for Virtual Visits (Telemedicine).  Patients are able to view lab/test results, encounter notes, upcoming appointments, etc.  Non-urgent messages can be sent to your provider as well.   To learn more about what you can do with MyChart, go to [email or web address].    Your next appointment:   1 year(s)  The format for your next appointment:   In Person  Provider:   [name], MD       Important Information About Sugar

## 2022-08-01 ENCOUNTER — Other Ambulatory Visit: Payer: Self-pay | Admitting: Cardiovascular Disease

## 2022-08-01 ENCOUNTER — Other Ambulatory Visit: Payer: Self-pay | Admitting: Internal Medicine

## 2022-08-01 DIAGNOSIS — K2100 Gastro-esophageal reflux disease with esophagitis, without bleeding: Secondary | ICD-10-CM

## 2022-08-03 ENCOUNTER — Telehealth: Payer: Self-pay | Admitting: Internal Medicine

## 2022-08-03 NOTE — Telephone Encounter (Signed)
Patient said that she called [facility] and that they did not receive a referral on her.  Please get with patient and let her know updated information.

## 2022-08-04 ENCOUNTER — Ambulatory Visit (INDEPENDENT_AMBULATORY_CARE_PROVIDER_SITE_OTHER): Payer: Medicare Other | Admitting: Sports Medicine

## 2022-08-04 ENCOUNTER — Ambulatory Visit (INDEPENDENT_AMBULATORY_CARE_PROVIDER_SITE_OTHER): Payer: Medicare Other

## 2022-08-04 ENCOUNTER — Other Ambulatory Visit: Payer: Self-pay | Admitting: Sports Medicine

## 2022-08-04 VITALS — BP 132/82 | HR 70 | Ht 67.0 in | Wt 192.0 lb

## 2022-08-04 DIAGNOSIS — M79605 Pain in left leg: Secondary | ICD-10-CM

## 2022-08-04 MED ORDER — MELOXICAM 15 MG PO TABS: 15.0000 mg | ORAL_TABLET | Freq: Every day | ORAL | 0 refills | Status: DC
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
## 2022-08-04 NOTE — Patient Instructions (Addendum)
Good to see you - Start meloxicam 15 mg daily x2 weeks.   Do not to use additional NSAIDs while taking meloxicam.   May use Tylenol [phone] mg 2 to 3 times a day for breakthrough pain. 3 week follow up

## 2022-08-06 ENCOUNTER — Telehealth: Payer: Self-pay | Admitting: Sports Medicine

## 2022-08-06 ENCOUNTER — Other Ambulatory Visit: Payer: Self-pay | Admitting: Sports Medicine

## 2022-08-06 MED ORDER — MELOXICAM 15 MG PO TABS: 15.0000 mg | ORAL_TABLET | Freq: Every day | ORAL | 0 refills | Status: DC

## 2022-08-06 NOTE — Telephone Encounter (Signed)
Patient called asking if the prescription for meloxicam (MOBIC) 15 MG tablet could be resent to [id number] [place], [place] [address] She said that she has not been able to get it from the previous pharmacy.  Please advise.  Patient would like a call back when it has been sent.

## 2022-08-06 NOTE — Telephone Encounter (Signed)
New rx placed at new pharmacy

## 2022-08-10 ENCOUNTER — Other Ambulatory Visit: Payer: Self-pay | Admitting: Internal Medicine

## 2022-08-10 ENCOUNTER — Other Ambulatory Visit: Payer: Self-pay | Admitting: Cardiovascular Disease

## 2022-08-10 DIAGNOSIS — K219 Gastro-esophageal reflux disease without esophagitis: Secondary | ICD-10-CM

## 2022-08-10 DIAGNOSIS — E039 Hypothyroidism, unspecified: Secondary | ICD-10-CM
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
## 2022-08-25 ENCOUNTER — Ambulatory Visit (INDEPENDENT_AMBULATORY_CARE_PROVIDER_SITE_OTHER): Payer: Medicare Other | Admitting: Sports Medicine

## 2022-08-25 VITALS — BP 132/82 | HR 65 | Ht 67.0 in | Wt 191.0 lb

## 2022-08-25 DIAGNOSIS — M79605 Pain in left leg: Secondary | ICD-10-CM

## 2022-08-25 DIAGNOSIS — M1612 Unilateral primary osteoarthritis, left hip: Secondary | ICD-10-CM | POA: Diagnosis not present

## 2022-08-25 DIAGNOSIS — M1712 Unilateral primary osteoarthritis, left knee: Secondary | ICD-10-CM | POA: Diagnosis not present

## 2022-08-25 NOTE — Patient Instructions (Addendum)
Good to see you - Start meloxicam 15 mg daily x2 weeks.  If still having pain after 2 weeks, complete 3rd-week of meloxicam. May use remaining meloxicam as needed once daily for pain control.  Do not to use additional NSAIDs while taking meloxicam.  May use Tylenol [phone] mg 2 to 3 times a day for breakthrough pain. 3 week follow up

## 2022-09-03 ENCOUNTER — Ambulatory Visit
Admission: RE | Admit: 2022-09-03 | Discharge: 2022-09-03 | Disposition: A | Discharge status: Home / Self Care | Payer: Medicare Other | Source: Ambulatory Visit | Attending: Internal Medicine | Admitting: Internal Medicine

## 2022-09-03 DIAGNOSIS — Z1231 Encounter for screening mammogram for malignant neoplasm of breast: Secondary | ICD-10-CM | POA: Diagnosis not present

## 2022-09-10 ENCOUNTER — Ambulatory Visit (INDEPENDENT_AMBULATORY_CARE_PROVIDER_SITE_OTHER): Payer: Medicare Other | Admitting: Podiatry

## 2022-09-10 DIAGNOSIS — L819 Disorder of pigmentation, unspecified: Secondary | ICD-10-CM | POA: Diagnosis not present

## 2022-09-10 DIAGNOSIS — B351 Tinea unguium: Secondary | ICD-10-CM | POA: Diagnosis not present

## 2022-09-10 NOTE — Patient Instructions (Signed)
You can also use "urea nail gel" on the toenails.   If you notice any worsening darkness to the toenail or any signs of infection let me know. If it were to change we may need to do a further biopsy of the toenail.

## 2022-09-10 NOTE — Progress Notes (Signed)
Subjective: Chief Complaint  Patient presents with   Nail Problem    Left hallux nail fungus    71 year old female presents for above concerns.  She has not been putting any medication in the toenail. She washes it with soap and water, sometimes vinegar.   Objective: AAO x3, NAD DP/PT pulses palpable bilaterally, CRT less than 3 seconds There is dark streaks on multiple lesser toenails.  Color appears to be somewhat improved but still darkened discoloration.  No edema, erythema, drainage or pus or any signs of infection.  No extension of hyperpigmentation of the surrounding skin.  No open lesions. No pain with calf compression, swelling, warmth, erythema    Assessment: Hyperpigmentation hallux toenail also with lesser digit nails  Plan: -All treatment options discussed with the patient including all alternatives, risks, complications.  -Previous biopsy was negative.  We will continue to monitor and if there is any further clinical concern consider rebiopsy.  Continue topical medication for nail fungus at this time. -Patient encouraged to call the office with any questions, concerns, change in symptoms.   Trula Slade DPM

## 2022-09-14 ENCOUNTER — Encounter: Payer: Self-pay | Admitting: Internal Medicine

## 2022-09-14 ENCOUNTER — Ambulatory Visit (INDEPENDENT_AMBULATORY_CARE_PROVIDER_SITE_OTHER): Payer: Medicare Other | Admitting: Internal Medicine

## 2022-09-14 VITALS — BP 136/78 | HR 66 | Temp 98.1°F | Resp 16 | Ht 67.0 in | Wt 191.0 lb

## 2022-09-14 DIAGNOSIS — I2584 Coronary atherosclerosis due to calcified coronary lesion: Secondary | ICD-10-CM | POA: Diagnosis not present

## 2022-09-14 DIAGNOSIS — E039 Hypothyroidism, unspecified: Secondary | ICD-10-CM | POA: Diagnosis not present

## 2022-09-14 DIAGNOSIS — I2510 Atherosclerotic heart disease of native coronary artery without angina pectoris: Secondary | ICD-10-CM | POA: Diagnosis not present

## 2022-09-14 DIAGNOSIS — E118 Type 2 diabetes mellitus with unspecified complications: Secondary | ICD-10-CM | POA: Diagnosis not present

## 2022-09-14 DIAGNOSIS — I10 Essential (primary) hypertension: Secondary | ICD-10-CM | POA: Diagnosis not present

## 2022-09-14 DIAGNOSIS — Z23 Encounter for immunization: Secondary | ICD-10-CM | POA: Diagnosis not present

## 2022-09-14 LAB — BASIC METABOLIC PANEL WITH GFR
BUN: 7 mg/dL (ref 6–23)
CO2: 27 meq/L (ref 19–32)
Calcium: 9.8 mg/dL (ref 8.4–10.5)
Chloride: 103 meq/L (ref 96–112)
Creatinine, Ser: 0.75 mg/dL (ref 0.40–1.20)
GFR: 80.16 mL/min (ref >60.00)
Glucose, Bld: 115 mg/dL — ABNORMAL HIGH (ref 70–99)
Potassium: 4.2 meq/L (ref 3.5–5.1)
Sodium: 140 meq/L (ref 135–145)

## 2022-09-14 LAB — CBC WITH DIFFERENTIAL/PLATELET
Basophils Absolute: 0.1 10*3/uL (ref 0.0–0.1)
Basophils Relative: 1 % (ref 0.0–3.0)
Eosinophils Absolute: 0.3 10*3/uL (ref 0.0–0.7)
Eosinophils Relative: 3.6 % (ref 0.0–5.0)
HCT: 39.9 % (ref 36.0–46.0)
Hemoglobin: 13.2 g/dL (ref 12.0–15.0)
Lymphocytes Relative: 32.8 % (ref 12.0–46.0)
Lymphs Abs: 2.5 10*3/uL (ref 0.7–4.0)
MCHC: 33 g/dL (ref 30.0–36.0)
MCV: 80.2 fl (ref 78.0–100.0)
Monocytes Absolute: 0.5 10*3/uL (ref 0.1–1.0)
Monocytes Relative: 6.2 % (ref 3.0–12.0)
Neutro Abs: 4.2 10*3/uL (ref 1.4–7.7)
Neutrophils Relative %: 56.4 % (ref 43.0–77.0)
Platelets: 273 10*3/uL (ref 150.0–400.0)
RBC: 4.97 Mil/uL (ref 3.87–5.11)
RDW: 14.3 % (ref 11.5–15.5)
WBC: 7.5 10*3/uL (ref 4.0–10.5)

## 2022-09-14 LAB — URINALYSIS, ROUTINE W REFLEX MICROSCOPIC
Bilirubin Urine: NEGATIVE
Hgb urine dipstick: NEGATIVE
Ketones, ur: NEGATIVE
Leukocytes,Ua: NEGATIVE
Nitrite: NEGATIVE
RBC / HPF: NONE SEEN (ref 0–?)
Specific Gravity, Urine: <=1.005 — AB (ref 1.000–1.030)
Total Protein, Urine: NEGATIVE
Urine Glucose: NEGATIVE
Urobilinogen, UA: 0.2 (ref 0.0–1.0)
pH: 6 (ref 5.0–8.0)

## 2022-09-14 LAB — MICROALBUMIN / CREATININE URINE RATIO
Creatinine,U: 55.1 mg/dL
Microalb Creat Ratio: 1.3 mg/g (ref 0.0–30.0)
Microalb, Ur: <0.7 mg/dL (ref 0.0–1.9)

## 2022-09-14 LAB — TSH: TSH: 2.32 u[IU]/mL (ref 0.35–5.50)

## 2022-09-14 LAB — HEMOGLOBIN A1C: Hgb A1c MFr Bld: 6.7 % — ABNORMAL HIGH (ref 4.6–6.5)

## 2022-09-14 MED ORDER — CONTOUR NEXT GEN MONITOR W/DEVICE KIT: 1.0000 | PACK | Freq: Two times a day (BID) | 0 refills | Status: DC

## 2022-09-14 MED ORDER — FREESTYLE LIBRE 2 SENSOR MISC: 1.0000 | Freq: Every day | 5 refills | Status: DC

## 2022-09-14 MED ORDER — EMPAGLIFLOZIN 10 MG PO TABS: 10.0000 mg | ORAL_TABLET | Freq: Every day | ORAL | 0 refills | Status: DC

## 2022-09-14 MED ORDER — FREESTYLE LIBRE 2 READER DEVI: 1.0000 | Freq: Every day | 5 refills | Status: DC
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
## 2022-09-14 NOTE — Patient Instructions (Signed)
                                                                    [email or web address]

## 2022-09-14 NOTE — Progress Notes (Signed)
Subjective:  Patient ID: [name], female    DOB: 03-Jun-1951  Age: 71 y.o. MRN: [id number]  CC: Hypertension, Hypothyroidism, and Diabetes   HPI [name] presents for f/up -  She is active and denies chest pain, shortness of breath, diaphoresis, or edema.  Outpatient Medications Prior to Visit  Medication Sig Dispense Refill   amLODipine (NORVASC) 5 MG tablet TAKE ONE TABLET BY MOUTH EVERY MORNING 90 tablet 3   carvedilol (COREG) 6.25 MG tablet TAKE ONE TABLET BY MOUTH EVERY MORNING and TAKE ONE TABLET BY MOUTH EVERYDAY AT BEDTIME 180 tablet 3   cephALEXin (KEFLEX) 500 MG capsule Take 1 capsule (500 mg total) by mouth 3 (three) times daily. 21 capsule 0   clonazePAM (KLONOPIN) 0.5 MG tablet Take 1 tablet (0.5 mg total) by mouth 2 (two) times daily as needed for anxiety. 180 tablet 1   clopidogrel (PLAVIX) 75 MG tablet Take 1 tablet (75 mg total) by mouth every morning. 90 tablet 3   famotidine (PEPCID) 40 MG tablet TAKE ONE TABLET BY MOUTH EVERY MORNING 90 tablet 1   isosorbide mononitrate (IMDUR) 30 MG 24 hr tablet Take 1 tablet (30 mg total) by mouth every morning. 90 tablet 3   levocetirizine (XYZAL) 5 MG tablet TAKE ONE TABLET BY MOUTH EVERYDAY AT BEDTIME 90 tablet 1   levothyroxine (SYNTHROID) 100 MCG tablet TAKE ONE TABLET BY MOUTH BEFORE BREAKFAST 90 tablet 0   linaclotide (LINZESS) 145 MCG CAPS capsule Take 1 capsule (145 mcg total) by mouth daily before breakfast. 90 capsule 1   losartan (COZAAR) 25 MG tablet Take 1 tablet (25 mg total) by mouth every morning. 90 tablet 3   magnesium oxide (MAG-OX) 400 MG tablet Take 400 mg by mouth daily.     meloxicam (MOBIC) 15 MG tablet Take 1 tablet (15 mg total) by mouth daily. 30 tablet 0   nitroGLYCERIN (NITROSTAT) 0.4 MG SL tablet Place 1 tablet (0.4 mg total) under the tongue every 5 (five) minutes as needed for chest pain. 25 tablet 6   pantoprazole (PROTONIX) 20 MG tablet TAKE ONE TABLET BY MOUTH EVERY MORNING 90 tablet 0    rosuvastatin (CRESTOR) 10 MG tablet Take 1 tablet (10 mg total) by mouth at bedtime. 90 tablet 3   Tavaborole 5 % SOLN Apply 1 application topically daily. 10 mL 2   traMADol (ULTRAM) 50 MG tablet Take 1 tablet (50 mg total) by mouth every 12 (twelve) hours as needed. 180 tablet 1   Vitamin D, Ergocalciferol, (DRISDOL) 1.25 MG (50000 UNIT) CAPS capsule Take 1 capsule (50,000 Units total) by mouth every 7 (seven) days. 12 capsule 0   No facility-administered medications prior to visit.    ROS Review of Systems  Constitutional: Negative.  Negative for diaphoresis and fatigue.  HENT: Negative.    Eyes: Negative.   Respiratory:  Negative for cough, chest tightness, shortness of breath and wheezing.   Cardiovascular:  Negative for chest pain, palpitations and leg swelling.  Gastrointestinal:  Negative for abdominal pain, diarrhea and nausea.  Endocrine: Negative.   Genitourinary: Negative.  Negative for difficulty urinating.  Musculoskeletal: Negative.   Skin: Negative.   Neurological:  Negative for dizziness, weakness, light-headedness and numbness.  Hematological:  Negative for adenopathy. Does not bruise/bleed easily.  Psychiatric/Behavioral: Negative.      Objective:  BP 136/78 (BP Location: Right Arm, Patient Position: Sitting, Cuff Size: Large)   Pulse 66   Temp 98.1 F (36.7 C) (Oral)  Resp 16   Ht 5' 7"  (1.702 m)   Wt 191 lb (86.6 kg)   SpO2 96%   BMI 29.91 kg/m   BP Readings from Last 3 Encounters:  09/14/22 136/78  08/25/22 132/82  08/04/22 132/82    Wt Readings from Last 3 Encounters:  09/14/22 191 lb (86.6 kg)  08/25/22 191 lb (86.6 kg)  08/04/22 192 lb (87.1 kg)    Physical Exam Vitals reviewed.  HENT:     Mouth/Throat:     Mouth: Mucous membranes are moist.  Eyes:     General: No scleral icterus.    Conjunctiva/sclera: Conjunctivae normal.  Cardiovascular:     Rate and Rhythm: Normal rate and regular rhythm.     Heart sounds: No murmur  heard. Pulmonary:     Effort: Pulmonary effort is normal.     Breath sounds: No stridor. No wheezing, rhonchi or rales.  Abdominal:     General: Abdomen is protuberant.     Palpations: There is no mass.     Tenderness: There is no abdominal tenderness. There is no guarding.     Hernia: No hernia is present.  Musculoskeletal:        General: Normal range of motion.     Cervical back: Neck supple.     Right lower leg: No edema.     Left lower leg: No edema.  Lymphadenopathy:     Cervical: No cervical adenopathy.  Skin:    General: Skin is warm and dry.  Neurological:     General: No focal deficit present.     Mental Status: She is alert.     Lab Results  Component Value Date   WBC 7.5 09/14/2022   HGB 13.2 09/14/2022   HCT 39.9 09/14/2022   PLT 273.0 09/14/2022   GLUCOSE 115 (H) 09/14/2022   CHOL 116 12/14/2021   TRIG 92.0 12/14/2021   HDL 53.40 12/14/2021   LDLDIRECT 88.0 10/19/2016   LDLCALC 44 12/14/2021   ALT 10 12/14/2021   AST 14 12/14/2021   NA 140 09/14/2022   K 4.2 09/14/2022   CL 103 09/14/2022   CREATININE 0.75 09/14/2022   BUN 7 09/14/2022   CO2 27 09/14/2022   TSH 2.32 09/14/2022   INR 1.01 05/08/2018   HGBA1C 6.7 (H) 09/14/2022   MICROALBUR <0.7 09/14/2022    MM 3D SCREEN BREAST BILATERAL  Result Date: 09/06/2022 CLINICAL DATA:  Screening. EXAM: DIGITAL SCREENING BILATERAL MAMMOGRAM WITH TOMOSYNTHESIS AND CAD TECHNIQUE: Bilateral screening digital craniocaudal and mediolateral oblique mammograms were obtained. Bilateral screening digital breast tomosynthesis was performed. The images were evaluated with computer-aided detection. COMPARISON:  Prior mammograms dating back to 2008. ACR Breast Density Category b: There are scattered areas of fibroglandular density. FINDINGS: There are no findings suspicious for malignancy. Unchanged appearance from remote studies. IMPRESSION: No mammographic evidence of malignancy. A result letter of this screening  mammogram will be mailed directly to the patient. RECOMMENDATION: Screening mammogram in one year. (Code:SM-B-01Y) BI-RADS CATEGORY  1: Negative. Electronically Signed   By: Margarette Canada M.D.   On: 09/06/2022 08:34    Assessment & Plan:   [name] was seen today for hypertension, hypothyroidism and diabetes.  Diagnoses and all orders for this visit:  Flu vaccine need -     Flu Vaccine QUAD High Dose(Fluad)  Essential hypertension- Her blood pressure is adequately well controlled. -     Basic metabolic panel; Future -     CBC with Differential/Platelet; Future -  TSH; Future -     Urinalysis, Routine w reflex microscopic; Future -     Urinalysis, Routine w reflex microscopic -     TSH -     CBC with Differential/Platelet -     Basic metabolic panel  Type II diabetes mellitus with manifestations ([place])- Her blood sugar is adequately well controlled. -     Basic metabolic panel; Future -     Hemoglobin A1c; Future -     Microalbumin / creatinine urine ratio; Future -     Blood Glucose Monitoring Suppl (CONTOUR NEXT GEN MONITOR) w/Device KIT; 1 Act by Does not apply route 2 (two) times daily. -     Continuous Blood Gluc Sensor (FREESTYLE LIBRE 2 SENSOR) MISC; 1 Act by Does not apply route daily. -     Continuous Blood Gluc Receiver (FREESTYLE LIBRE 2 READER) DEVI; 1 Act by Does not apply route daily. -     Microalbumin / creatinine urine ratio -     Hemoglobin A1c -     Basic metabolic panel  Acquired hypothyroidism- She is euthyroid. -     TSH; Future -     TSH   I am having [name] start on Contour Next Gen Monitor, FreeStyle Libre 2 Sensor, and [employer] 2 Reader. I am also having her maintain her linaclotide, nitroGLYCERIN, magnesium oxide, Tavaborole, Vitamin D (Ergocalciferol), traMADol, clonazePAM, levocetirizine, cephALEXin, carvedilol, amLODipine, famotidine, meloxicam, isosorbide mononitrate, pantoprazole, levothyroxine, clopidogrel, losartan, and  rosuvastatin.  Meds ordered this encounter  Medications   Blood Glucose Monitoring Suppl (CONTOUR NEXT GEN MONITOR) w/Device KIT    Sig: 1 Act by Does not apply route 2 (two) times daily.    Dispense:  2 kit    Refill:  0   Continuous Blood Gluc Sensor (FREESTYLE LIBRE 2 SENSOR) MISC    Sig: 1 Act by Does not apply route daily.    Dispense:  2 each    Refill:  5   Continuous Blood Gluc Receiver (FREESTYLE LIBRE 2 READER) DEVI    Sig: 1 Act by Does not apply route daily.    Dispense:  2 each    Refill:  5     Follow-up: Return in about 6 months (around 03/16/2023).  [name], MD

## 2022-09-15 ENCOUNTER — Encounter: Payer: Self-pay | Admitting: Internal Medicine

## 2022-09-15 ENCOUNTER — Ambulatory Visit (INDEPENDENT_AMBULATORY_CARE_PROVIDER_SITE_OTHER): Payer: Medicare Other | Admitting: Sports Medicine

## 2022-09-15 VITALS — HR 70 | Ht 67.0 in | Wt 191.0 lb

## 2022-09-15 DIAGNOSIS — M79605 Pain in left leg: Secondary | ICD-10-CM | POA: Diagnosis not present

## 2022-09-15 DIAGNOSIS — M1712 Unilateral primary osteoarthritis, left knee: Secondary | ICD-10-CM | POA: Diagnosis not present

## 2022-09-15 DIAGNOSIS — M1612 Unilateral primary osteoarthritis, left hip: Secondary | ICD-10-CM | POA: Diagnosis not present

## 2022-09-15 NOTE — Patient Instructions (Addendum)
Good to see you Tylenol [phone] mg 2-3 times a day for pain relief  Quad HEP Pt referral  6 week follow up
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
## 2022-09-23 ENCOUNTER — Encounter: Payer: Self-pay | Admitting: Family Medicine

## 2022-09-23 ENCOUNTER — Telehealth: Payer: Self-pay | Admitting: Internal Medicine

## 2022-09-23 ENCOUNTER — Ambulatory Visit (INDEPENDENT_AMBULATORY_CARE_PROVIDER_SITE_OTHER): Payer: Medicare Other | Admitting: Family Medicine

## 2022-09-23 VITALS — BP 140/80 | HR 70 | Temp 97.6°F | Ht 67.0 in | Wt 188.0 lb

## 2022-09-23 DIAGNOSIS — E11649 Type 2 diabetes mellitus with hypoglycemia without coma: Secondary | ICD-10-CM | POA: Diagnosis not present

## 2022-09-23 DIAGNOSIS — E876 Hypokalemia: Secondary | ICD-10-CM | POA: Diagnosis not present

## 2022-09-23 DIAGNOSIS — R400 Somnolence: Secondary | ICD-10-CM | POA: Diagnosis not present

## 2022-09-23 LAB — COMPREHENSIVE METABOLIC PANEL
ALT: 8 U/L (ref 0–35)
AST: 14 U/L (ref 0–37)
Albumin: 4.1 g/dL (ref 3.5–5.2)
Alkaline Phosphatase: 69 U/L (ref 39–117)
BUN: 13 mg/dL (ref 6–23)
CO2: 29 mEq/L (ref 19–32)
Calcium: 9.3 mg/dL (ref 8.4–10.5)
Chloride: 106 mEq/L (ref 96–112)
Creatinine, Ser: 0.8 mg/dL (ref 0.40–1.20)
GFR: 74.17 mL/min (ref >60.00)
Glucose, Bld: 136 mg/dL — ABNORMAL HIGH (ref 70–99)
Potassium: 3.3 mEq/L — ABNORMAL LOW (ref 3.5–5.1)
Sodium: 141 mEq/L (ref 135–145)
Total Bilirubin: 0.7 mg/dL (ref 0.2–1.2)
Total Protein: 7.2 g/dL (ref 6.0–8.3)

## 2022-09-23 LAB — CBC WITH DIFFERENTIAL/PLATELET
Basophils Absolute: 0.1 10*3/uL (ref 0.0–0.1)
Basophils Relative: 1.2 % (ref 0.0–3.0)
Eosinophils Absolute: 0.2 10*3/uL (ref 0.0–0.7)
Eosinophils Relative: 2.3 % (ref 0.0–5.0)
HCT: 37.7 % (ref 36.0–46.0)
Hemoglobin: 12.5 g/dL (ref 12.0–15.0)
Lymphocytes Relative: 34.4 % (ref 12.0–46.0)
Lymphs Abs: 2.8 10*3/uL (ref 0.7–4.0)
MCHC: 33.2 g/dL (ref 30.0–36.0)
MCV: 79.1 fl (ref 78.0–100.0)
Monocytes Absolute: 0.5 10*3/uL (ref 0.1–1.0)
Monocytes Relative: 5.9 % (ref 3.0–12.0)
Neutro Abs: 4.6 10*3/uL (ref 1.4–7.7)
Neutrophils Relative %: 56.2 % (ref 43.0–77.0)
Platelets: 291 10*3/uL (ref 150.0–400.0)
RBC: 4.76 Mil/uL (ref 3.87–5.11)
RDW: 14.5 % (ref 11.5–15.5)
WBC: 8.2 10*3/uL (ref 4.0–10.5)

## 2022-09-23 MED ORDER — POTASSIUM CHLORIDE CRYS ER 20 MEQ PO TBCR: 20.0000 meq | EXTENDED_RELEASE_TABLET | Freq: Two times a day (BID) | ORAL | 0 refills | Status: DC

## 2022-09-23 NOTE — Telephone Encounter (Signed)
I sent [name] a message on teams letting her know that I forward this message to her.

## 2022-09-23 NOTE — Telephone Encounter (Signed)
Pt scheduled for today 10/26 @ 2pm with Harland Dingwall, NP

## 2022-09-23 NOTE — Telephone Encounter (Signed)
Pt called to report since beginning jardiance pt has had low blood sugar readings and more tired. Low then eat and it goes up. Please advise. Call pt [phone]

## 2022-09-23 NOTE — Telephone Encounter (Signed)
Patient called back, she said that her sugar has been reading 53,58 and 69. She is experiencing a terrible headache right now and wants to know what to do. Sent patient through to triage.

## 2022-09-23 NOTE — Telephone Encounter (Signed)
Patient called back and I offered to get her in tomorrow with one of our physicians.  Patient declined.

## 2022-09-23 NOTE — Patient Instructions (Addendum)
Take the potassium supplement one tablet twice daily for 3 days and then stop.   Do not take Jardiance again. Eat a healthy diet with protein and fiber.   Follow up with me or Dr. [name] next week.   If you develop any concerning symptoms such as dizziness, chest pain, shortness of breath, or low blood sugars <70 then call 911.

## 2022-09-23 NOTE — Progress Notes (Signed)
Subjective:     Patient ID: [name], female    DOB: 22-Aug-1951, 71 y.o.   MRN: [id number]  Chief Complaint  Patient presents with   low blood sugars    Last night had to get up 2-3 times to eat something. Recently started Jardiance on Monday. BS are dropping to 53 then 58 then 69 after eating each time.     HPI Patient is in today for low blood sugars last night. She started on Jardiance on 09/14/22. Last dose was yesterday at 6 am.   Hgb A1c 6.7%   States her blood sugar dropped in the 50s last night and she got up and ate a peanut butter sandwich and her blood sugar kept dropping in the 50s per her continuous glucose monitor.  She called EMS and they came to her home to evaluate her.  At 11:42 am her blood sugar was 140s checked by EMS.   Blood sugar is currently 109 per her continuous glucose monitor.   She feels mostly back to baseline. Still feels slightly tired.   Denies fever, chills, dizziness, chest pain, palpitations, shortness of breath, abdominal pain, N/V/D, urinary symptoms, LE edema.     Health Maintenance Due  Topic Date Due   Zoster Vaccines- Shingrix (1 of 2) Never done    Past Medical History:  Diagnosis Date   Anemia    Arthritis    Cardiomyopathy ([place])    Cholelithiasis    Chronic back pain    Constipation    Esophageal reflux    Fibromyalgia    Hemorrhoids    Hyperlipidemia    Hypertension    Hypothyroid    IBS (irritable bowel syndrome)    Internal hemorrhoids without mention of complication    Left leg pain    Myocardial infarction [facility])    Personal history of colonic polyps 05/06/2010   ADENOMATOUS POLYP   Pneumonia    Tubular adenoma of colon     Past Surgical History:  Procedure Laterality Date   ABDOMINAL HYSTERECTOMY     CERVICAL DISC SURGERY     PLATE IN NECK & BACK   CHOLECYSTECTOMY     COLONOSCOPY  09/27/2011   NORMAL    COLONOSCOPY WITH PROPOFOL N/A 10/06/2020   Procedure: COLONOSCOPY WITH PROPOFOL;  Surgeon:  Irving Copas., MD;  Location: [facility] ENDOSCOPY;  Service: Gastroenterology;  Laterality: N/A;  ultra slim scope    LEFT HEART CATH AND CORONARY ANGIOGRAPHY N/A 05/08/2018   Procedure: LEFT HEART CATH AND CORONARY ANGIOGRAPHY;  Surgeon: Belva Crome, MD;  Location: [facility];  Service: Cardiovascular;  Laterality: N/A;   LUMBAR DISC SURGERY     POLYPECTOMY  10/06/2020   Procedure: POLYPECTOMY;  Surgeon: Mansouraty, Telford Nab., MD;  Location: [facility] ENDOSCOPY;  Service: Gastroenterology;;   TONSILLECTOMY      Family History  Problem Relation Age of Onset   Diabetes Mother        aunts and uncles   Kidney disease Mother        stage 35   Lung disease Mother        colapsed lung/in hospice   COPD Mother    Hypertension Mother    Dementia Mother    Breast cancer Maternal Aunt    Heart failure Father        Mother   Breast cancer Cousin    Colon cancer Neg Hx    Esophageal cancer Neg Hx    Stomach cancer  Neg Hx    Rectal cancer Neg Hx     Social History   Socioeconomic History   Marital status: Widowed    Spouse name: Not on file   Number of children: 2   Years of education: Not on file   Highest education level: Not on file  Occupational History   Occupation: housewife    Employer: UNEMPLOYED   Tobacco Use   Smoking status: Never    Passive exposure: Never   Smokeless tobacco: Never  Vaping Use   Vaping Use: Never used  Substance and Sexual Activity   Alcohol use: No    Alcohol/week: 0.0 standard drinks of alcohol   Drug use: No   Sexual activity: Not Currently  Other Topics Concern   Not on file  Social History Narrative   Daily Caffeine Use   Social Determinants of Health   Financial Resource Strain: Low Risk  (12/11/2021)   Overall Financial Resource Strain (CARDIA)    Difficulty of Paying Living Expenses: Not hard at all  Food Insecurity: No Food Insecurity (12/11/2021)   Hunger Vital Sign    Worried About Running Out of Food in the Last Year:  Never true    [place] in the Last Year: Never true  Transportation Needs: No Transportation Needs (12/11/2021)   PRAPARE - Hydrologist (Medical): No    Lack of Transportation (Non-Medical): No  Physical Activity: Inactive (12/11/2021)   Exercise Vital Sign    Days of Exercise per Week: 0 days    Minutes of Exercise per Session: 0 min  Stress: No Stress Concern Present (12/11/2021)   [place]    Feeling of Stress : Not at all  Social Connections: Moderately Isolated (12/11/2021)   Social Connection and Isolation Panel [NHANES]    Frequency of Communication with Friends and Family: Twice a week    Frequency of Social Gatherings with Friends and Family: Twice a week    Attends Religious Services: More than 4 times per year    Active Member of [employer] or Organizations: No    Attends Archivist Meetings: Never    Marital Status: Widowed  Intimate Partner Violence: Not At Risk (12/11/2021)   Humiliation, Afraid, Rape, and Kick questionnaire    Fear of Current or Ex-Partner: No    Emotionally Abused: No    Physically Abused: No    Sexually Abused: No    Outpatient Medications Prior to Visit  Medication Sig Dispense Refill   amLODipine (NORVASC) 5 MG tablet TAKE ONE TABLET BY MOUTH EVERY MORNING 90 tablet 3   Blood Glucose Monitoring Suppl (CONTOUR NEXT GEN MONITOR) w/Device KIT 1 Act by Does not apply route 2 (two) times daily. 2 kit 0   carvedilol (COREG) 6.25 MG tablet TAKE ONE TABLET BY MOUTH EVERY MORNING and TAKE ONE TABLET BY MOUTH EVERYDAY AT BEDTIME 180 tablet 3   cephALEXin (KEFLEX) 500 MG capsule Take 1 capsule (500 mg total) by mouth 3 (three) times daily. 21 capsule 0   clonazePAM (KLONOPIN) 0.5 MG tablet Take 1 tablet (0.5 mg total) by mouth 2 (two) times daily as needed for anxiety. 180 tablet 1   clopidogrel (PLAVIX) 75 MG tablet Take 1 tablet (75 mg total) by mouth  every morning. 90 tablet 3   Continuous Blood Gluc Receiver (FREESTYLE LIBRE 2 READER) DEVI 1 Act by Does not apply route daily. 2 each 5  Continuous Blood Gluc Sensor (FREESTYLE LIBRE 2 SENSOR) MISC 1 Act by Does not apply route daily. 2 each 5   famotidine (PEPCID) 40 MG tablet TAKE ONE TABLET BY MOUTH EVERY MORNING 90 tablet 1   isosorbide mononitrate (IMDUR) 30 MG 24 hr tablet Take 1 tablet (30 mg total) by mouth every morning. 90 tablet 3   levocetirizine (XYZAL) 5 MG tablet TAKE ONE TABLET BY MOUTH EVERYDAY AT BEDTIME 90 tablet 1   levothyroxine (SYNTHROID) 100 MCG tablet TAKE ONE TABLET BY MOUTH BEFORE BREAKFAST 90 tablet 0   linaclotide (LINZESS) 145 MCG CAPS capsule Take 1 capsule (145 mcg total) by mouth daily before breakfast. 90 capsule 1   losartan (COZAAR) 25 MG tablet Take 1 tablet (25 mg total) by mouth every morning. 90 tablet 3   magnesium oxide (MAG-OX) 400 MG tablet Take 400 mg by mouth daily.     meloxicam (MOBIC) 15 MG tablet Take 1 tablet (15 mg total) by mouth daily. 30 tablet 0   nitroGLYCERIN (NITROSTAT) 0.4 MG SL tablet Place 1 tablet (0.4 mg total) under the tongue every 5 (five) minutes as needed for chest pain. 25 tablet 6   pantoprazole (PROTONIX) 20 MG tablet TAKE ONE TABLET BY MOUTH EVERY MORNING 90 tablet 0   rosuvastatin (CRESTOR) 10 MG tablet Take 1 tablet (10 mg total) by mouth at bedtime. 90 tablet 3   Tavaborole 5 % SOLN Apply 1 application topically daily. 10 mL 2   traMADol (ULTRAM) 50 MG tablet Take 1 tablet (50 mg total) by mouth every 12 (twelve) hours as needed. 180 tablet 1   Vitamin D, Ergocalciferol, (DRISDOL) 1.25 MG (50000 UNIT) CAPS capsule Take 1 capsule (50,000 Units total) by mouth every 7 (seven) days. 12 capsule 0   empagliflozin (JARDIANCE) 10 MG TABS tablet Take 1 tablet (10 mg total) by mouth daily before breakfast. 90 tablet 0   No facility-administered medications prior to visit.    Allergies  Allergen Reactions   Aspirin Rash     unknown   Gadolinium Hives    patient unsure of which kind of dye she had a reaction to until reaction to Magnevist - may be allergic to x-ray contrast, Onset Date: [id number]    Iohexol Hives    patient unsure of which kind of dye she had a reaction to until reaction to Magnevist - may be allergic to x-ray contrast, Onset Date: [id number]   Livalo [Pitavastatin] Other (See Comments)    myalgias   Hydrocodone-Acetaminophen Rash   Lipitor [Atorvastatin] Other (See Comments)    Myalgias    ROS     Objective:    Physical Exam Constitutional:      General: She is not in acute distress. HENT:     Mouth/Throat:     Mouth: Mucous membranes are moist.  Eyes:     Conjunctiva/sclera: Conjunctivae normal.     Pupils: Pupils are equal, round, and reactive to light.  Cardiovascular:     Rate and Rhythm: Normal rate and regular rhythm.  Pulmonary:     Effort: Pulmonary effort is normal.     Breath sounds: Normal breath sounds.  Skin:    General: Skin is warm and dry.  Neurological:     General: No focal deficit present.     Mental Status: She is alert and oriented to person, place, and time.  Psychiatric:        Mood and Affect: Mood normal.        Behavior:  Behavior normal.        Thought Content: Thought content normal.     BP (!) 140/80 (BP Location: Left Arm, Patient Position: Sitting, Cuff Size: Large)   Pulse 70   Temp 97.6 F (36.4 C) (Temporal)   Ht 5' 7"  (1.702 m)   Wt 188 lb (85.3 kg)   SpO2 97%   BMI 29.44 kg/m  Wt Readings from Last 3 Encounters:  09/23/22 188 lb (85.3 kg)  09/15/22 191 lb (86.6 kg)  09/14/22 191 lb (86.6 kg)       Assessment & Plan:   Problem List Items Addressed This Visit       Endocrine   Hypoglycemia associated with type 2 diabetes mellitus ([place]) - Primary   Relevant Orders   CBC with Differential/Platelet (Completed)   Comprehensive metabolic panel (Completed)     Other   Somnolence   Relevant Orders   CBC with  Differential/Platelet (Completed)   Comprehensive metabolic panel (Completed)   Other Visit Diagnoses     Hypokalemia       Relevant Medications   potassium chloride SA (KLOR-CON M) 20 MEQ tablet      BS 114 prior to discharge and she has not eaten in 2 1/2 hours. BS is now holding steady and she reports feeling back to baseline. She appears safe to go home and keep an eye on her blood sugars with the [employer].  She will stop Jardiance (last dose yesterday at 6 am).  Stat labs ordered and she was monitored in office for at least 45 minutes to make sure her blood sugars were in a normal range and no longer dropping below 100.  Replace potassium.  Strict precautions to call 911 if she has any more readings less than 70 but I do not expect this to happen.  BS prior to discharge 114. Reports feeling back to baseline.   Follow up with Dr. [name] or myself next week.   Visit time 40 minutes in face to face communication with patient and coordination of care, additional 10 minutes spent in record review, coordination or care, ordering tests, communicating/referring to other healthcare professionals, documenting in medical records all on the same day of the visit for total time 50 minutes spent on the visit.     I have discontinued Annalee Meyerhoff. [place]'s empagliflozin. I am also having her start on potassium chloride SA. Additionally, I am having her maintain her linaclotide, nitroGLYCERIN, magnesium oxide, Tavaborole, Vitamin D (Ergocalciferol), traMADol, clonazePAM, levocetirizine, cephALEXin, carvedilol, amLODipine, famotidine, meloxicam, isosorbide mononitrate, pantoprazole, levothyroxine, clopidogrel, losartan, rosuvastatin, Contour Next Gen Monitor, FreeStyle Libre 2 Sensor, and [employer] 2 Reader.  Meds ordered this encounter  Medications   potassium chloride SA (KLOR-CON M) 20 MEQ tablet    Sig: Take 1 tablet (20 mEq total) by mouth 2 (two) times daily.    Dispense:  10 tablet     Refill:  0    Order Specific Question:   Supervising Provider    Answer:   Pricilla Holm A [[id number]]

## 2022-09-23 NOTE — Telephone Encounter (Signed)
Noted  

## 2022-09-24 ENCOUNTER — Ambulatory Visit: Payer: Medicare Other

## 2022-09-28 DIAGNOSIS — H9312 Tinnitus, left ear: Secondary | ICD-10-CM | POA: Diagnosis not present

## 2022-09-28 DIAGNOSIS — H903 Sensorineural hearing loss, bilateral: Secondary | ICD-10-CM | POA: Diagnosis not present

## 2022-09-30 ENCOUNTER — Ambulatory Visit (INDEPENDENT_AMBULATORY_CARE_PROVIDER_SITE_OTHER): Payer: Medicare Other | Admitting: Family Medicine

## 2022-09-30 ENCOUNTER — Encounter: Payer: Self-pay | Admitting: Family Medicine

## 2022-09-30 ENCOUNTER — Telehealth: Payer: Self-pay

## 2022-09-30 VITALS — BP 126/76 | HR 69 | Temp 97.6°F | Ht 67.0 in | Wt 191.0 lb

## 2022-09-30 DIAGNOSIS — E11649 Type 2 diabetes mellitus with hypoglycemia without coma: Secondary | ICD-10-CM | POA: Diagnosis not present

## 2022-09-30 DIAGNOSIS — E118 Type 2 diabetes mellitus with unspecified complications: Secondary | ICD-10-CM

## 2022-09-30 DIAGNOSIS — E876 Hypokalemia: Secondary | ICD-10-CM | POA: Diagnosis not present

## 2022-09-30 LAB — BASIC METABOLIC PANEL
BUN: 12 mg/dL (ref 6–23)
CO2: 30 mEq/L (ref 19–32)
Calcium: 9.3 mg/dL (ref 8.4–10.5)
Chloride: 103 mEq/L (ref 96–112)
Creatinine, Ser: 0.72 mg/dL (ref 0.40–1.20)
GFR: 84.16 mL/min (ref >60.00)
Glucose, Bld: 120 mg/dL — ABNORMAL HIGH (ref 70–99)
Potassium: 3.6 mEq/L (ref 3.5–5.1)
Sodium: 140 mEq/L (ref 135–145)

## 2022-09-30 MED ORDER — FREESTYLE LIBRE 2 SENSOR MISC: 1.0000 | Freq: Every day | 5 refills | Status: DC

## 2022-09-30 NOTE — Telephone Encounter (Signed)
PA started through [facility] for [employer] 2 sensor.  Key: [id number]

## 2022-09-30 NOTE — Assessment & Plan Note (Signed)
Patient had hypoglycemia after being on Jardiance for short time.  Jardiance was stopped and no more low blood sugars.  She feels back to baseline.  I will not restart medication.  She will follow-up with her PCP in 3 months as opposed to 6 months

## 2022-09-30 NOTE — Progress Notes (Signed)
Her potassium is back in normal range.  Make sure she is eating a well-balanced diet

## 2022-09-30 NOTE — Assessment & Plan Note (Signed)
She is not on a diuretic.  Replaced potassium and will recheck BMP today.

## 2022-09-30 NOTE — Patient Instructions (Signed)
Continue eating a healthy diet and getting exercise.   Follow up with any concerns.   See Dr. [name] in 3 months instead of 6 months since you are no longer on Jardiance.

## 2022-09-30 NOTE — Progress Notes (Signed)
Subjective:     Patient ID: [name], female    DOB: [birth year], 71 y.o.   MRN: [id number]  Chief Complaint  Patient presents with   Follow-up    F/u for hypoglycemia, states they are doing better as they are ranging in the 90s before meals and 140s maybe lower after a meal    HPI Patient is in today for follow up on hypoglycemia and hypokalemia from last week.  I stopped her Vania Rea which was new prior to hypoglycemic events.  She uses the freestyle libre continuous glucose monitor.  Feeling fine, back to baseline.  No more low blood sugars.  Completed a 5 day course of potassium supplements.   Recheck K today.   Denies fever, chills, dizziness, chest pain, palpitations, shortness of breath, abdominal pain, N/V/D, urinary symptoms, LE edema.    Health Maintenance Due  Topic Date Due   Zoster Vaccines- Shingrix (1 of 2) Never done    Past Medical History:  Diagnosis Date   Anemia    Arthritis    Cardiomyopathy ([place])    Cholelithiasis    Chronic back pain    Constipation    Esophageal reflux    Fibromyalgia    Hemorrhoids    Hyperlipidemia    Hypertension    Hypothyroid    IBS (irritable bowel syndrome)    Internal hemorrhoids without mention of complication    Left leg pain    Myocardial infarction [facility])    Personal history of colonic polyps 05/06/2010   ADENOMATOUS POLYP   Pneumonia    Tubular adenoma of colon     Past Surgical History:  Procedure Laterality Date   ABDOMINAL HYSTERECTOMY     CERVICAL DISC SURGERY     PLATE IN NECK & BACK   CHOLECYSTECTOMY     COLONOSCOPY  09/27/2011   NORMAL    COLONOSCOPY WITH PROPOFOL N/A 10/06/2020   Procedure: COLONOSCOPY WITH PROPOFOL;  Surgeon: Irving Copas., MD;  Location: [facility] ENDOSCOPY;  Service: Gastroenterology;  Laterality: N/A;  ultra slim scope    LEFT HEART CATH AND CORONARY ANGIOGRAPHY N/A 05/08/2018   Procedure: LEFT HEART CATH AND CORONARY ANGIOGRAPHY;  Surgeon: Belva Crome, MD;   Location: [facility] LAB;  Service: Cardiovascular;  Laterality: N/A;   LUMBAR DISC SURGERY     POLYPECTOMY  10/06/2020   Procedure: POLYPECTOMY;  Surgeon: Mansouraty, Telford Nab., MD;  Location: [facility] ENDOSCOPY;  Service: Gastroenterology;;   TONSILLECTOMY      Family History  Problem Relation Age of Onset   Diabetes Mother        aunts and uncles   Kidney disease Mother        stage 49   Lung disease Mother        colapsed lung/in hospice   COPD Mother    Hypertension Mother    Dementia Mother    Breast cancer Maternal Aunt    Heart failure Father        Mother   Breast cancer Cousin    Colon cancer Neg Hx    Esophageal cancer Neg Hx    Stomach cancer Neg Hx    Rectal cancer Neg Hx     Social History   Socioeconomic History   Marital status: Widowed    Spouse name: Not on file   Number of children: 2   Years of education: Not on file   Highest education level: Not on file  Occupational History   Occupation: housewife  Employer: UNEMPLOYED   Tobacco Use   Smoking status: Never    Passive exposure: Never   Smokeless tobacco: Never  Vaping Use   Vaping Use: Never used  Substance and Sexual Activity   Alcohol use: No    Alcohol/week: 0.0 standard drinks of alcohol   Drug use: No   Sexual activity: Not Currently  Other Topics Concern   Not on file  Social History Narrative   Daily Caffeine Use   Social Determinants of Health   Financial Resource Strain: Low Risk  (12/11/2021)   Overall Financial Resource Strain (CARDIA)    Difficulty of Paying Living Expenses: Not hard at all  Food Insecurity: No Food Insecurity (12/11/2021)   Hunger Vital Sign    Worried About Running Out of Food in the Last Year: Never true    Ran Out of Food in the Last Year: Never true  Transportation Needs: No Transportation Needs (12/11/2021)   PRAPARE - Hydrologist (Medical): No    Lack of Transportation (Non-Medical): No  Physical Activity: Inactive  (12/11/2021)   Exercise Vital Sign    Days of Exercise per Week: 0 days    Minutes of Exercise per Session: 0 min  Stress: No Stress Concern Present (12/11/2021)   [place]    Feeling of Stress : Not at all  Social Connections: Moderately Isolated (12/11/2021)   Social Connection and Isolation Panel [NHANES]    Frequency of Communication with Friends and Family: Twice a week    Frequency of Social Gatherings with Friends and Family: Twice a week    Attends Religious Services: More than 4 times per year    Active Member of [employer] or Organizations: No    Attends Archivist Meetings: Never    Marital Status: Widowed  Intimate Partner Violence: Not At Risk (12/11/2021)   Humiliation, Afraid, Rape, and Kick questionnaire    Fear of Current or Ex-Partner: No    Emotionally Abused: No    Physically Abused: No    Sexually Abused: No    Outpatient Medications Prior to Visit  Medication Sig Dispense Refill   amLODipine (NORVASC) 5 MG tablet TAKE ONE TABLET BY MOUTH EVERY MORNING 90 tablet 3   carvedilol (COREG) 6.25 MG tablet TAKE ONE TABLET BY MOUTH EVERY MORNING and TAKE ONE TABLET BY MOUTH EVERYDAY AT BEDTIME 180 tablet 3   cephALEXin (KEFLEX) 500 MG capsule Take 1 capsule (500 mg total) by mouth 3 (three) times daily. 21 capsule 0   clonazePAM (KLONOPIN) 0.5 MG tablet Take 1 tablet (0.5 mg total) by mouth 2 (two) times daily as needed for anxiety. 180 tablet 1   clopidogrel (PLAVIX) 75 MG tablet Take 1 tablet (75 mg total) by mouth every morning. 90 tablet 3   Continuous Blood Gluc Receiver (FREESTYLE LIBRE 2 READER) DEVI 1 Act by Does not apply route daily. 2 each 5   famotidine (PEPCID) 40 MG tablet TAKE ONE TABLET BY MOUTH EVERY MORNING 90 tablet 1   isosorbide mononitrate (IMDUR) 30 MG 24 hr tablet Take 1 tablet (30 mg total) by mouth every morning. 90 tablet 3   levocetirizine (XYZAL) 5 MG tablet TAKE ONE TABLET  BY MOUTH EVERYDAY AT BEDTIME 90 tablet 1   levothyroxine (SYNTHROID) 100 MCG tablet TAKE ONE TABLET BY MOUTH BEFORE BREAKFAST 90 tablet 0   linaclotide (LINZESS) 145 MCG CAPS capsule Take 1 capsule (145 mcg total) by mouth daily  before breakfast. 90 capsule 1   losartan (COZAAR) 25 MG tablet Take 1 tablet (25 mg total) by mouth every morning. 90 tablet 3   magnesium oxide (MAG-OX) 400 MG tablet Take 400 mg by mouth daily.     meloxicam (MOBIC) 15 MG tablet Take 1 tablet (15 mg total) by mouth daily. 30 tablet 0   nitroGLYCERIN (NITROSTAT) 0.4 MG SL tablet Place 1 tablet (0.4 mg total) under the tongue every 5 (five) minutes as needed for chest pain. 25 tablet 6   pantoprazole (PROTONIX) 20 MG tablet TAKE ONE TABLET BY MOUTH EVERY MORNING 90 tablet 0   potassium chloride SA (KLOR-CON M) 20 MEQ tablet Take 1 tablet (20 mEq total) by mouth 2 (two) times daily. 10 tablet 0   rosuvastatin (CRESTOR) 10 MG tablet Take 1 tablet (10 mg total) by mouth at bedtime. 90 tablet 3   Tavaborole 5 % SOLN Apply 1 application topically daily. 10 mL 2   traMADol (ULTRAM) 50 MG tablet Take 1 tablet (50 mg total) by mouth every 12 (twelve) hours as needed. 180 tablet 1   Vitamin D, Ergocalciferol, (DRISDOL) 1.25 MG (50000 UNIT) CAPS capsule Take 1 capsule (50,000 Units total) by mouth every 7 (seven) days. 12 capsule 0   Continuous Blood Gluc Sensor (FREESTYLE LIBRE 2 SENSOR) MISC 1 Act by Does not apply route daily. 2 each 5   Blood Glucose Monitoring Suppl (CONTOUR NEXT GEN MONITOR) w/Device KIT 1 Act by Does not apply route 2 (two) times daily. 2 kit 0   No facility-administered medications prior to visit.    Allergies  Allergen Reactions   Aspirin Rash    unknown   Gadolinium Hives    patient unsure of which kind of dye she had a reaction to until reaction to Magnevist - may be allergic to x-ray contrast, Onset Date: [id number]    Iohexol Hives    patient unsure of which kind of dye she had a reaction to  until reaction to Magnevist - may be allergic to x-ray contrast, Onset Date: [id number]   Livalo [Pitavastatin] Other (See Comments)    myalgias   Hydrocodone-Acetaminophen Rash   Lipitor [Atorvastatin] Other (See Comments)    Myalgias    ROS     Objective:    Physical Exam Constitutional:      General: She is not in acute distress.    Appearance: She is not ill-appearing.  Cardiovascular:     Rate and Rhythm: Normal rate and regular rhythm.  Pulmonary:     Effort: Pulmonary effort is normal.     Breath sounds: Normal breath sounds.  Skin:    General: Skin is warm and dry.  Neurological:     General: No focal deficit present.     Mental Status: She is alert and oriented to person, place, and time.  Psychiatric:        Mood and Affect: Mood normal.        Behavior: Behavior normal.        Thought Content: Thought content normal.     BP 126/76 (BP Location: Left Arm, Patient Position: Sitting, Cuff Size: Large)   Pulse 69   Temp 97.6 F (36.4 C) (Temporal)   Ht _0  (1.702 m)   Wt 191 lb (86.6 kg)   SpO2 99%   BMI 29.91 kg/m  Wt Readings from Last 3 Encounters:  09/30/22 191 lb (86.6 kg)  09/23/22 188 lb (85.3 kg)  09/15/22 191 lb (86.6 kg)  Assessment & Plan:   Problem List Items Addressed This Visit       Endocrine   Hypoglycemia associated with type 2 diabetes mellitus ([place]) - Primary    Patient had hypoglycemia after being on Jardiance for short time.  Jardiance was stopped and no more low blood sugars.  She feels back to baseline.  I will not restart medication.  She will follow-up with her PCP in 3 months as opposed to 6 months      Relevant Orders   Basic metabolic panel   Type II diabetes mellitus with manifestations ([facility])   Relevant Medications   Continuous Blood Gluc Sensor (FREESTYLE LIBRE 2 SENSOR) MISC     Other   Hypokalemia    She is not on a diuretic.  Replaced potassium and will recheck BMP today.      Relevant Orders   Basic  metabolic panel     I have discontinued [place] Contour Next Gen Monitor. I am also having her maintain her linaclotide, nitroGLYCERIN, magnesium oxide, Tavaborole, Vitamin D (Ergocalciferol), traMADol, clonazePAM, levocetirizine, cephALEXin, carvedilol, amLODipine, famotidine, meloxicam, isosorbide mononitrate, pantoprazole, levothyroxine, clopidogrel, losartan, rosuvastatin, FreeStyle Libre 2 Reader, potassium chloride SA, and FreeStyle Libre 2 Sensor.  Meds ordered this encounter  Medications   Continuous Blood Gluc Sensor (FREESTYLE LIBRE 2 SENSOR) MISC    Sig: 1 Act by Does not apply route daily.    Dispense:  2 each    Refill:  5

## 2022-10-05 ENCOUNTER — Ambulatory Visit: Payer: Medicare Other | Attending: Sports Medicine

## 2022-10-05 ENCOUNTER — Telehealth: Payer: Self-pay | Admitting: Internal Medicine

## 2022-10-05 DIAGNOSIS — M79662 Pain in left lower leg: Secondary | ICD-10-CM | POA: Insufficient documentation

## 2022-10-05 DIAGNOSIS — M6281 Muscle weakness (generalized): Secondary | ICD-10-CM | POA: Insufficient documentation

## 2022-10-05 DIAGNOSIS — M79605 Pain in left leg: Secondary | ICD-10-CM | POA: Insufficient documentation

## 2022-10-05 DIAGNOSIS — M1712 Unilateral primary osteoarthritis, left knee: Secondary | ICD-10-CM | POA: Insufficient documentation

## 2022-10-05 DIAGNOSIS — R262 Difficulty in walking, not elsewhere classified: Secondary | ICD-10-CM | POA: Insufficient documentation

## 2022-10-05 DIAGNOSIS — M1612 Unilateral primary osteoarthritis, left hip: Secondary | ICD-10-CM | POA: Diagnosis not present
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
## 2022-10-05 NOTE — Telephone Encounter (Signed)
Patient needs to speak to someone about her diabetes monitor - patient does not want to say what it is about.

## 2022-10-06 MED ORDER — BLOOD GLUCOSE METER KIT: PACK | 0 refills | Status: DC

## 2022-10-06 NOTE — Telephone Encounter (Signed)
Rx sent 

## 2022-10-12 ENCOUNTER — Ambulatory Visit: Payer: Medicare Other

## 2022-10-12 DIAGNOSIS — M1612 Unilateral primary osteoarthritis, left hip: Secondary | ICD-10-CM | POA: Diagnosis not present

## 2022-10-12 DIAGNOSIS — M6281 Muscle weakness (generalized): Secondary | ICD-10-CM | POA: Diagnosis not present

## 2022-10-12 DIAGNOSIS — M79662 Pain in left lower leg: Secondary | ICD-10-CM | POA: Diagnosis not present

## 2022-10-12 DIAGNOSIS — R262 Difficulty in walking, not elsewhere classified: Secondary | ICD-10-CM | POA: Diagnosis not present

## 2022-10-12 DIAGNOSIS — M79605 Pain in left leg: Secondary | ICD-10-CM | POA: Diagnosis not present

## 2022-10-12 DIAGNOSIS — M1712 Unilateral primary osteoarthritis, left knee: Secondary | ICD-10-CM | POA: Diagnosis not present

## 2022-10-12 NOTE — Therapy (Signed)
OUTPATIENT PHYSICAL THERAPY TREATMENT NOTE   Patient Name: [name] MRN: [id number] DOB:06-30-1951, 71 y.o., female Today's Date: 10/12/2022  PCP: [name], MD  REFERRING PROVIDER: Glennon Mac, DO   END Milas Kocher, MD  SESSION:   PT End of Session - 10/12/22 [phone]     Visit Number 2    Number of Visits 13    Date for PT Re-Evaluation 11/26/22    Authorization Type UHC MEDICARE    PT Start Time 0805    PT Stop Time 9024    PT Time Calculation (min) 42 min    Activity Tolerance Patient tolerated treatment well;Patient limited by pain    Behavior During Therapy [facility] for tasks assessed/performed             Past Medical History:  Diagnosis Date   Anemia    Arthritis    Cardiomyopathy ([place])    Cholelithiasis    Chronic back pain    Constipation    Esophageal reflux    Fibromyalgia    Hemorrhoids    Hyperlipidemia    Hypertension    Hypothyroid    IBS (irritable bowel syndrome)    Internal hemorrhoids without mention of complication    Left leg pain    Myocardial infarction [facility])    Personal history of colonic polyps 05/06/2010   ADENOMATOUS POLYP   Pneumonia    Tubular adenoma of colon    Past Surgical History:  Procedure Laterality Date   ABDOMINAL HYSTERECTOMY     CERVICAL DISC SURGERY     PLATE IN NECK & BACK   CHOLECYSTECTOMY     COLONOSCOPY  09/27/2011   NORMAL    COLONOSCOPY WITH PROPOFOL N/A 10/06/2020   Procedure: COLONOSCOPY WITH PROPOFOL;  Surgeon: Irving Copas., MD;  Location: [facility] ENDOSCOPY;  Service: Gastroenterology;  Laterality: N/A;  ultra slim scope    LEFT HEART CATH AND CORONARY ANGIOGRAPHY N/A 05/08/2018   Procedure: LEFT HEART CATH AND CORONARY ANGIOGRAPHY;  Surgeon: Belva Crome, MD;  Location: [place] CV LAB;  Service: Cardiovascular;  Laterality: N/A;   LUMBAR DISC SURGERY     POLYPECTOMY  10/06/2020   Procedure: POLYPECTOMY;  Surgeon: Mansouraty, Telford Nab., MD;  Location: [place];  Service:  Gastroenterology;;   TONSILLECTOMY     Patient Active Problem List   Diagnosis Date Noted   Hypoglycemia associated with type 2 diabetes mellitus ([place]) 09/23/2022   Somnolence 09/23/2022   Flu vaccine need 09/14/2022   Sensorineural hearing loss (SNHL) of left ear with unrestricted hearing of right ear 07/21/2022   Primary osteoarthritis of left knee 12/24/2021   Onychomycosis of left great toe 12/14/2021   Type II diabetes mellitus with manifestations ([place]) 12/14/2021   Balding 05/21/2020   Allergic rhinitis 04/12/2020   Takotsubo cardiomyopathy 02/26/2019   NASH (nonalcoholic steatohepatitis) 01/22/2019   Chronic idiopathic constipation 01/12/2019   Arthritis of carpometacarpal ([place]) joint of left thumb 09/25/2018   Degenerative arthritis of left knee 05/24/2018   CAD (coronary artery disease) 05/16/2018   Vitamin D deficiency 11/11/2017   Degenerative disc disease, lumbar 08/29/2017   Obesity (BMI 30.0-34.9) 06/14/2016   Routine general medical examination at a health care facility 12/31/2014   Osteopenia 07/20/2013   Hypokalemia 06/14/2012   GERD (gastroesophageal reflux disease) 09/16/2011   Generalized anxiety disorder 09/16/2011   Constipation, slow transit 09/16/2011   Personal history of colonic polyps 08/17/2011   DJD (degenerative joint disease) of knee 06/25/2011   Pure hyperglyceridemia  06/24/2011   Hyperlipidemia LDL goal <70 07/30/2009   Hypothyroidism 04/25/2009   Essential hypertension 04/25/2009    REFERRING DIAG:  Left leg pain, Primary osteoarthritis of left knee, Primary osteoarthritis of left hip   THERAPY DIAG:  Pain in left lower leg  Muscle weakness (generalized)  Difficulty in walking, not elsewhere classified  Rationale for Evaluation and Treatment Rehabilitation  SUBJECTIVE:    SUBJECTIVE STATEMENT: Pt reports a deep ache of the L thigh. Sometimes it feels better. Pt denies popping or clicking with her L hip.   PAIN:  Are you having  pain? Yes: NPRS scale: 7/10 Pain location: L leg from the hip to her knee Pain description: Deep painful itch/ache (had difficulty describing pain) Aggravating factors: Prolonged standing or walking Relieving factors: Rest, elevation 0-8/10  PERTINENT HISTORY: Backer's cyst; CAD, HTN   PRECAUTIONS: None   WEIGHT BEARING RESTRICTIONS: No   PATIENT GOALS: To have less pain with her L leg and for it to get stronger.   OBJECTIVE: (objective measures completed at initial evaluation unless otherwise dated)   DIAGNOSTIC FINDINGS: 08/04/22  FINDINGS: Cortical margins of the left femur are intact. There is no evidence of fracture or other focal bone lesions. Mild degenerative change of the hip and knee. Soft tissues are unremarkable.   IMPRESSION: No fracture or acute findings of the left femur. Minimal left hip and knee degenerative change.   FINDINGS: The cortical margins of the tibia and fibula are intact. There is no evidence of fracture or other focal bone lesions. Mild degenerative change of the knee. Ankle alignment is maintained. Soft tissues are unremarkable.   IMPRESSION: No fracture of the left lower leg.   PATIENT SURVEYS:  FOTO TBA   COGNITION: Overall cognitive status: Within functional limits for tasks assessed                         SENSATION: WFL   MUSCLE LENGTH: Hamstrings: Right NT deg; Left NT deg Marcello Moores test: Right NT deg; Left NT deg   POSTURE: forward head   PALPATION: TTP to the L anterior/lateral thigh/hip and inguinal areas   LOWER EXTREMITY ROM:   Active ROM Right eval Left eval  Hip flexion   Decreased, limted by pain  Hip extension      Hip abduction   Decreased, limted by pain  Hip adduction        Hip internal rotation   Decreased, limted by pain  Hip external rotation   Decreased, limted by pain  Knee flexion 130d 112d,Decreased, limted by pain  Knee extension      Ankle dorsiflexion      Ankle plantarflexion      Ankle  inversion      Ankle eversion       (Blank rows = not tested)   LOWER EXTREMITY MMT:   MMT Right eval Left eval  Hip flexion 4 3 pain  Hip extension 4 3 pain  Hip abduction 4 3  Hip adduction      Hip internal rotation      Hip external rotation 4 3  Knee flexion 5 4+  Knee extension 5 4+  Ankle dorsiflexion      Ankle plantarflexion      Ankle inversion      Ankle eversion       (Blank rows = not tested)   LOWER EXTREMITY SPECIAL TESTS:  Hip special tests: Saralyn Pilar (FABER) test: positive  and FADIR :  Positive   FUNCTIONAL TESTS:  5 times sit to stand: 27.4 sec c use of hands from bariatric chair 2 minute walk test: TBA when less symptomatic   GAIT: Distance walked: 200' Assistive device utilized: None Level of assistance: Complete Independence Comments: moderate antalgic gait pattern initially after standing which decreases to minimal after walking approx 15 ft     TODAY'S TREATMENT:                                                                                                                              DATE:   [facility] Adult PT Treatment:                                                DATE: 10/12/22 Therapeutic Exercise: Supine marching 2x10 each SAQ 2x10 Off set bridging, L foot partial forward 2x10 Supine clam 2x10 GTB SL hip abd x4, pain ful and discontinued STS 2x5 Rest breaks needed between sets  Manual Therapy: Axial distraction of the L hip  Eval: No treatment provided today.    PATIENT EDUCATION:  Education details: Eval findings, POC, Self care; use of moist heat for pain management Person educated: Patient Education method: Explanation Education comprehension: verbalized understanding   HOME EXERCISE PROGRAM: Access Code: [email or web address] URL: [email or web address] Date: 10/12/2022 Prepared by: Gar Ponto  Exercises - Supine Knee Extension Strengthening  - 1 x daily - 7 x weekly - 1 sets - 10 reps - 3 hold - Supine March  - 1 x  daily - 7 x weekly - 1 sets - 20 reps - 2 hold - Hooklying Clamshell with Resistance  - 1 x daily - 7 x weekly - 1 sets - 10 reps - 3 hold - Supine Bridge  - 1 x daily - 7 x weekly - 1 sets - 10 reps - 3 hold - Sit to Stand with Counter Support  - 1 x daily - 7 x weekly - 3 sets - 5 reps   ASSESSMENT:   CLINICAL IMPRESSION: FADER, FADIR, passive hip flex and hip grind tests (no clicking) provoked pain. Axial distraction decreases pain. Therex were completed to initiate strengthening for the L LE. Except for SL L hip abd which was painful, the other therex were more difficult due to weakness vs. pain. With compressive test reproducing pain and distraction decreasing pain pain maybe be joint related. Pt was provided a HEP. Pt tolerated PT without an overall increase in L thigh pain. Assess response to HEP the next PT session.    OBJECTIVE IMPAIRMENTS: decreased activity tolerance, difficulty walking, decreased ROM, decreased strength, impaired flexibility, and pain.    ACTIVITY LIMITATIONS: carrying, lifting, bending, standing, squatting, stairs, dressing, locomotion level, and caring for others   PARTICIPATION LIMITATIONS: meal prep, cleaning, laundry, shopping, community activity, and church   PERSONAL  FACTORS: Age, Fitness, and Time since onset of injury/illness/exacerbation are also affecting patient's functional outcome.    GOALS:   SHORT TERM GOALS: Target date: 10/26/2022  Pt will be Ind in an initial HEP Baseline: To be initiated Goal status: INITIAL   2.  Pt will voice understanding of measures to assist in pain reduction Baseline: Initiated Goal status: INITIAL   LONG TERM GOALS: Target date: 11/26/22   Pt will be Ind in a final HEP to maintain achieved LOF  Baseline: To be initiated Goal status: INITIAL   2.  Increase L hip strength to 4/5 and L knee strength to 5/5 for improved functional use of the L LE with decreased pain Baseline: see flow sheet Goal status:  INITIAL   3.  Increase L knee flex ROM 125d for improved functional use of L LE with decreased pain Baseline: 112d Goal status: INITIAL   4.  Improve 5xSTS by 10" and 2MWT by MCID of 36f as indication of improved functional mobility  Baseline: 5xSTS=27.4 sec c use of hands from bariatric chair, 2MWT=TBA Goal status: INITIAL   5.  Pt will report a decrease in L LE pain to 3/10 or less with daily activities for improved function and QOL Baseline: 0-8/10 Goal status: INITIAL   PLAN:   PT FREQUENCY: 2x/week   PT DURATION: 6 weeks   PLANNED INTERVENTIONS: Therapeutic exercises, Therapeutic activity, Balance training, Gait training, Patient/Family education, Self Care, Joint mobilization, Stair training, Aquatic Therapy, Dry Needling, Electrical stimulation, Cryotherapy, Moist heat, Taping, Vasopneumatic device, Ultrasound, Ionotophoresis 414mml Dexamethasone, Manual therapy, and Re-evaluation   PLAN FOR NEXT SESSION: Complete FOTO; develop HEP; progress therex as indicated; use of modalities, manual therapy; and TPDN as indicated.  Shaeley Segall MS, PT 10/12/22 9:00 AM

## 2022-10-14 ENCOUNTER — Ambulatory Visit: Payer: Medicare Other

## 2022-10-14 DIAGNOSIS — R262 Difficulty in walking, not elsewhere classified: Secondary | ICD-10-CM | POA: Diagnosis not present

## 2022-10-14 DIAGNOSIS — M1712 Unilateral primary osteoarthritis, left knee: Secondary | ICD-10-CM | POA: Diagnosis not present

## 2022-10-14 DIAGNOSIS — M6281 Muscle weakness (generalized): Secondary | ICD-10-CM | POA: Diagnosis not present

## 2022-10-14 DIAGNOSIS — M79662 Pain in left lower leg: Secondary | ICD-10-CM | POA: Diagnosis not present

## 2022-10-14 DIAGNOSIS — M79605 Pain in left leg: Secondary | ICD-10-CM | POA: Diagnosis not present

## 2022-10-14 DIAGNOSIS — M1612 Unilateral primary osteoarthritis, left hip: Secondary | ICD-10-CM | POA: Diagnosis not present

## 2022-10-14 NOTE — Therapy (Addendum)
OUTPATIENT PHYSICAL THERAPY TREATMENT NOTE   Patient Name: [name] MRN: [id number] DOB:1951/05/27, 71 y.o., female Today's Date: 10/14/2022  PCP: [name], MD  REFERRING PROVIDER: Glennon Mac, DO   END Milas Kocher, MD  SESSION:   PT End of Session - 10/14/22 0814     Visit Number 3    Number of Visits 13    Date for PT Re-Evaluation 11/26/22    Authorization Type [employer] MEDICARE    PT Start Time 0806    PT Stop Time 0855    PT Time Calculation (min) 49 min    Activity Tolerance Patient tolerated treatment well;Patient limited by pain              Past Medical History:  Diagnosis Date   Anemia    Arthritis    Cardiomyopathy ([place])    Cholelithiasis    Chronic back pain    Constipation    Esophageal reflux    Fibromyalgia    Hemorrhoids    Hyperlipidemia    Hypertension    Hypothyroid    IBS (irritable bowel syndrome)    Internal hemorrhoids without mention of complication    Left leg pain    Myocardial infarction [facility])    Personal history of colonic polyps 05/06/2010   ADENOMATOUS POLYP   Pneumonia    Tubular adenoma of colon    Past Surgical History:  Procedure Laterality Date   ABDOMINAL HYSTERECTOMY     CERVICAL DISC SURGERY     PLATE IN NECK & BACK   CHOLECYSTECTOMY     COLONOSCOPY  09/27/2011   NORMAL    COLONOSCOPY WITH PROPOFOL N/A 10/06/2020   Procedure: COLONOSCOPY WITH PROPOFOL;  Surgeon: Irving Copas., MD;  Location: [facility] ENDOSCOPY;  Service: Gastroenterology;  Laterality: N/A;  ultra slim scope    LEFT HEART CATH AND CORONARY ANGIOGRAPHY N/A 05/08/2018   Procedure: LEFT HEART CATH AND CORONARY ANGIOGRAPHY;  Surgeon: Belva Crome, MD;  Location: [place] CV LAB;  Service: Cardiovascular;  Laterality: N/A;   LUMBAR DISC SURGERY     POLYPECTOMY  10/06/2020   Procedure: POLYPECTOMY;  Surgeon: Mansouraty, Telford Nab., MD;  Location: [place];  Service: Gastroenterology;;   TONSILLECTOMY     Patient Active  Problem List   Diagnosis Date Noted   Hypoglycemia associated with type 2 diabetes mellitus ([place]) 09/23/2022   Somnolence 09/23/2022   Flu vaccine need 09/14/2022   Sensorineural hearing loss (SNHL) of left ear with unrestricted hearing of right ear 07/21/2022   Primary osteoarthritis of left knee 12/24/2021   Onychomycosis of left great toe 12/14/2021   Type II diabetes mellitus with manifestations ([place]) 12/14/2021   Balding 05/21/2020   Allergic rhinitis 04/12/2020   Takotsubo cardiomyopathy 02/26/2019   NASH (nonalcoholic steatohepatitis) 01/22/2019   Chronic idiopathic constipation 01/12/2019   Arthritis of carpometacarpal ([place]) joint of left thumb 09/25/2018   Degenerative arthritis of left knee 05/24/2018   CAD (coronary artery disease) 05/16/2018   Vitamin D deficiency 11/11/2017   Degenerative disc disease, lumbar 08/29/2017   Obesity (BMI 30.0-34.9) 06/14/2016   Routine general medical examination at a health care facility 12/31/2014   Osteopenia 07/20/2013   Hypokalemia 06/14/2012   GERD (gastroesophageal reflux disease) 09/16/2011   Generalized anxiety disorder 09/16/2011   Constipation, slow transit 09/16/2011   Personal history of colonic polyps 08/17/2011   DJD (degenerative joint disease) of knee 06/25/2011   Pure hyperglyceridemia 06/24/2011   Hyperlipidemia LDL goal <70 07/30/2009  Hypothyroidism 04/25/2009   Essential hypertension 04/25/2009    REFERRING DIAG:  Left leg pain, Primary osteoarthritis of left knee, Primary osteoarthritis of left hip   THERAPY DIAG:  Pain in left lower leg  Muscle weakness (generalized)  Difficulty in walking, not elsewhere classified  Rationale for Evaluation and Treatment Rehabilitation  SUBJECTIVE:    SUBJECTIVE STATEMENT: Pt reports her L thigh is feeling a little better.   PAIN:  Are you having pain? Yes: NPRS scale: %/10 Pain location: L leg from the hip to her knee Pain description: Deep painful itch/ache  (had difficulty describing pain) Aggravating factors: Prolonged standing or walking Relieving factors: Rest, elevation 0-8/10  PERTINENT HISTORY: Baker's cyst; CAD, HTN   PRECAUTIONS: None   WEIGHT BEARING RESTRICTIONS: No   PATIENT GOALS: To have less pain with her L leg and for it to get stronger.   OBJECTIVE: (objective measures completed at initial evaluation unless otherwise dated)   DIAGNOSTIC FINDINGS: 08/04/22  FINDINGS: Cortical margins of the left femur are intact. There is no evidence of fracture or other focal bone lesions. Mild degenerative change of the hip and knee. Soft tissues are unremarkable.   IMPRESSION: No fracture or acute findings of the left femur. Minimal left hip and knee degenerative change.   FINDINGS: The cortical margins of the tibia and fibula are intact. There is no evidence of fracture or other focal bone lesions. Mild degenerative change of the knee. Ankle alignment is maintained. Soft tissues are unremarkable.   IMPRESSION: No fracture of the left lower leg.   PATIENT SURVEYS:  FOTO TBA   COGNITION: Overall cognitive status: Within functional limits for tasks assessed                         SENSATION: WFL   MUSCLE LENGTH: Hamstrings: Right NT deg; Left NT deg Marcello Moores test: Right NT deg; Left NT deg   POSTURE: forward head   PALPATION: TTP to the L anterior/lateral thigh/hip and inguinal areas   LOWER EXTREMITY ROM:   Active ROM Right eval Left eval  Hip flexion   Decreased, limted by pain  Hip extension      Hip abduction   Decreased, limted by pain  Hip adduction        Hip internal rotation   Decreased, limted by pain  Hip external rotation   Decreased, limted by pain  Knee flexion 130d 112d,Decreased, limted by pain  Knee extension      Ankle dorsiflexion      Ankle plantarflexion      Ankle inversion      Ankle eversion       (Blank rows = not tested)   LOWER EXTREMITY MMT:   MMT Right eval Left eval   Hip flexion 4 3 pain  Hip extension 4 3 pain  Hip abduction 4 3  Hip adduction      Hip internal rotation      Hip external rotation 4 3  Knee flexion 5 4+  Knee extension 5 4+  Ankle dorsiflexion      Ankle plantarflexion      Ankle inversion      Ankle eversion       (Blank rows = not tested)   LOWER EXTREMITY SPECIAL TESTS:  Hip special tests: Saralyn Pilar (FABER) test: positive  and FADIR : Positive   FUNCTIONAL TESTS:  5 times sit to stand: 27.4 sec c use of hands from bariatric chair 2 minute  walk test: TBA when less symptomatic   GAIT: Distance walked: 200' Assistive device utilized: None Level of assistance: Complete Independence Comments: moderate antalgic gait pattern initially after standing which decreases to minimal after walking approx 15 ft      TODAY'S TREATMENT:                                                                                                                              [facility] Adult PT Treatment:                                                DATE: 10/14/22 Therapeutic Exercise: Supine marching 2x10 each SAQ 2x10 Off set bridging, L foot partial forward 1x10 Supine clam 2x10 GTB Supine hip add sets c ball x10 5 sec STS 2x5 Rest breaks needed between therex Manual Therapy: Gentle STM to the L anterior thigh. TrPs and taut muscles which were significantly TTP  Self Care: Use of moist heat for pain management Modalities:  Moist heat to the L anterior thigh x 15 mins  [facility] Adult PT Treatment:                                                DATE: 10/12/22 Therapeutic Exercise: Supine marching 2x10 each SAQ 2x10 Off set bridging, L foot partial forward 2x10 Supine clam 2x10 GTB SL hip abd x4, pain ful and discontinued STS 2x5 Rest breaks needed between sets  Manual Therapy: Axial distraction of the L hip  Eval: No treatment provided today.    PATIENT EDUCATION:  Education details: Eval findings, POC, Self care; use of moist heat for  pain management Person educated: Patient Education method: Explanation Education comprehension: verbalized understanding   HOME EXERCISE PROGRAM: Access Code: [email or web address] URL: [email or web address] Date: 10/12/2022 Prepared by: Gar Ponto  Exercises - Supine Knee Extension Strengthening  - 1 x daily - 7 x weekly - 1 sets - 10 reps - 3 hold - Supine March  - 1 x daily - 7 x weekly - 1 sets - 20 reps - 2 hold - Hooklying Clamshell with Resistance  - 1 x daily - 7 x weekly - 1 sets - 10 reps - 3 hold - Supine Bridge  - 1 x daily - 7 x weekly - 1 sets - 10 reps - 3 hold - Sit to Stand with Counter Support  - 1 x daily - 7 x weekly - 3 sets - 5 reps   ASSESSMENT:   CLINICAL IMPRESSION: PT was completed for gentle STM to the L anterior thigh. Pt was significantly TTP and TrPs and taut muscle bands were palpated. Pt was not able to tolerate much pressure with  the massage. Pt then completed therex for L LE/knee strengthening at a low demand level. Discussed treatment options to help reduce the L thigh muscular pain including Estim and TPDN. Pt wanted time to consider these options. Pt's L thigh pain presentation is acute in nature and anticipate progress at a slower rate. Pt tolerated PT today without adverse effects. Pt is to complete her HEP as tolerated and to use moist heat for pain management.  OBJECTIVE IMPAIRMENTS: decreased activity tolerance, difficulty walking, decreased ROM, decreased strength, impaired flexibility, and pain.    ACTIVITY LIMITATIONS: carrying, lifting, bending, standing, squatting, stairs, dressing, locomotion level, and caring for others   PARTICIPATION LIMITATIONS: meal prep, cleaning, laundry, shopping, community activity, and church   PERSONAL FACTORS: Age, Fitness, and Time since onset of injury/illness/exacerbation are also affecting patient's functional outcome.    GOALS:   SHORT TERM GOALS: Target date: 10/26/2022  Pt will be Ind in an initial  HEP Baseline: To be initiated Goal status: INITIAL   2.  Pt will voice understanding of measures to assist in pain reduction Baseline: Initiated Goal status: INITIAL   LONG TERM GOALS: Target date: 11/26/22   Pt will be Ind in a final HEP to maintain achieved LOF  Baseline: To be initiated Goal status: INITIAL   2.  Increase L hip strength to 4/5 and L knee strength to 5/5 for improved functional use of the L LE with decreased pain Baseline: see flow sheet Goal status: INITIAL   3.  Increase L knee flex ROM 125d for improved functional use of L LE with decreased pain Baseline: 112d Goal status: INITIAL   4.  Improve 5xSTS by 10" and 2MWT by MCID of 70f as indication of improved functional mobility  Baseline: 5xSTS=27.4 sec c use of hands from bariatric chair, 2MWT=TBA Goal status: INITIAL   5.  Pt will report a decrease in L LE pain to 3/10 or less with daily activities for improved function and QOL Baseline: 0-8/10 Goal status: INITIAL   PLAN:   PT FREQUENCY: 2x/week   PT DURATION: 6 weeks   PLANNED INTERVENTIONS: Therapeutic exercises, Therapeutic activity, Balance training, Gait training, Patient/Family education, Self Care, Joint mobilization, Stair training, Aquatic Therapy, Dry Needling, Electrical stimulation, Cryotherapy, Moist heat, Taping, Vasopneumatic device, Ultrasound, Ionotophoresis 433mml Dexamethasone, Manual therapy, and Re-evaluation   PLAN FOR NEXT SESSION: Complete FOTO; develop HEP; progress therex as indicated; use of modalities, manual therapy; and TPDN as indicated.  Akari Defelice MS, PT 10/14/22 9:06 AM

## 2022-10-19 ENCOUNTER — Ambulatory Visit: Payer: Medicare Other

## 2022-10-19 DIAGNOSIS — M79662 Pain in left lower leg: Secondary | ICD-10-CM | POA: Diagnosis not present

## 2022-10-19 DIAGNOSIS — R262 Difficulty in walking, not elsewhere classified: Secondary | ICD-10-CM | POA: Diagnosis not present

## 2022-10-19 DIAGNOSIS — M79605 Pain in left leg: Secondary | ICD-10-CM | POA: Diagnosis not present

## 2022-10-19 DIAGNOSIS — M6281 Muscle weakness (generalized): Secondary | ICD-10-CM | POA: Diagnosis not present

## 2022-10-19 DIAGNOSIS — M1712 Unilateral primary osteoarthritis, left knee: Secondary | ICD-10-CM | POA: Diagnosis not present

## 2022-10-19 DIAGNOSIS — M1612 Unilateral primary osteoarthritis, left hip: Secondary | ICD-10-CM | POA: Diagnosis not present

## 2022-10-19 NOTE — Therapy (Signed)
OUTPATIENT PHYSICAL THERAPY TREATMENT NOTE   Patient Name: [name] MRN: [id number] DOB:Apr 16, 1951, 71 y.o., female Today's Date: 10/19/2022  PCP: [name], MD  REFERRING PROVIDER: Glennon Mac, DO   END Milas Kocher, MD  SESSION:   PT End of Session - 10/19/22 0813     Visit Number 4    Number of Visits 13    Date for PT Re-Evaluation 11/26/22    Authorization Type [employer] MEDICARE    PT Start Time 0805    PT Stop Time 0855    PT Time Calculation (min) 50 min    Activity Tolerance Patient tolerated treatment well;Patient limited by pain    Behavior During Therapy [facility] for tasks assessed/performed               Past Medical History:  Diagnosis Date   Anemia    Arthritis    Cardiomyopathy ([place])    Cholelithiasis    Chronic back pain    Constipation    Esophageal reflux    Fibromyalgia    Hemorrhoids    Hyperlipidemia    Hypertension    Hypothyroid    IBS (irritable bowel syndrome)    Internal hemorrhoids without mention of complication    Left leg pain    Myocardial infarction [facility])    Personal history of colonic polyps 05/06/2010   ADENOMATOUS POLYP   Pneumonia    Tubular adenoma of colon    Past Surgical History:  Procedure Laterality Date   ABDOMINAL HYSTERECTOMY     CERVICAL DISC SURGERY     PLATE IN NECK & BACK   CHOLECYSTECTOMY     COLONOSCOPY  09/27/2011   NORMAL    COLONOSCOPY WITH PROPOFOL N/A 10/06/2020   Procedure: COLONOSCOPY WITH PROPOFOL;  Surgeon: Irving Copas., MD;  Location: [facility] ENDOSCOPY;  Service: Gastroenterology;  Laterality: N/A;  ultra slim scope    LEFT HEART CATH AND CORONARY ANGIOGRAPHY N/A 05/08/2018   Procedure: LEFT HEART CATH AND CORONARY ANGIOGRAPHY;  Surgeon: Belva Crome, MD;  Location: [place] CV LAB;  Service: Cardiovascular;  Laterality: N/A;   LUMBAR DISC SURGERY     POLYPECTOMY  10/06/2020   Procedure: POLYPECTOMY;  Surgeon: Mansouraty, Telford Nab., MD;  Location: [place];   Service: Gastroenterology;;   TONSILLECTOMY     Patient Active Problem List   Diagnosis Date Noted   Hypoglycemia associated with type 2 diabetes mellitus ([place]) 09/23/2022   Somnolence 09/23/2022   Flu vaccine need 09/14/2022   Sensorineural hearing loss (SNHL) of left ear with unrestricted hearing of right ear 07/21/2022   Primary osteoarthritis of left knee 12/24/2021   Onychomycosis of left great toe 12/14/2021   Type II diabetes mellitus with manifestations ([place]) 12/14/2021   Balding 05/21/2020   Allergic rhinitis 04/12/2020   Takotsubo cardiomyopathy 02/26/2019   NASH (nonalcoholic steatohepatitis) 01/22/2019   Chronic idiopathic constipation 01/12/2019   Arthritis of carpometacarpal ([place]) joint of left thumb 09/25/2018   Degenerative arthritis of left knee 05/24/2018   CAD (coronary artery disease) 05/16/2018   Vitamin D deficiency 11/11/2017   Degenerative disc disease, lumbar 08/29/2017   Obesity (BMI 30.0-34.9) 06/14/2016   Routine general medical examination at a health care facility 12/31/2014   Osteopenia 07/20/2013   Hypokalemia 06/14/2012   GERD (gastroesophageal reflux disease) 09/16/2011   Generalized anxiety disorder 09/16/2011   Constipation, slow transit 09/16/2011   Personal history of colonic polyps 08/17/2011   DJD (degenerative joint disease) of knee 06/25/2011  Pure hyperglyceridemia 06/24/2011   Hyperlipidemia LDL goal <70 07/30/2009   Hypothyroidism 04/25/2009   Essential hypertension 04/25/2009    REFERRING DIAG:  Left leg pain, Primary osteoarthritis of left knee, Primary osteoarthritis of left hip   THERAPY DIAG:  Pain in left lower leg  Muscle weakness (generalized)  Difficulty in walking, not elsewhere classified  Rationale for Evaluation and Treatment Rehabilitation  SUBJECTIVE:    SUBJECTIVE STATEMENT: Pt reports no overall change in the L thigh pain. Pt reports a decrease in L thigh pain for the day of her last PT session.    PAIN:  Are you having pain? Yes: NPRS scale: 7/10 Pain location: L leg from the hip to her knee Pain description: Deep painful itch/ache (had difficulty describing pain) Aggravating factors: Prolonged standing or walking Relieving factors: Rest, elevation 0-8/10  PERTINENT HISTORY: Baker's cyst; CAD, HTN   PRECAUTIONS: None   WEIGHT BEARING RESTRICTIONS: No   PATIENT GOALS: To have less pain with her L leg and for it to get stronger.   OBJECTIVE: (objective measures completed at initial evaluation unless otherwise dated)   DIAGNOSTIC FINDINGS: 08/04/22  FINDINGS: Cortical margins of the left femur are intact. There is no evidence of fracture or other focal bone lesions. Mild degenerative change of the hip and knee. Soft tissues are unremarkable.   IMPRESSION: No fracture or acute findings of the left femur. Minimal left hip and knee degenerative change.   FINDINGS: The cortical margins of the tibia and fibula are intact. There is no evidence of fracture or other focal bone lesions. Mild degenerative change of the knee. Ankle alignment is maintained. Soft tissues are unremarkable.   IMPRESSION: No fracture of the left lower leg.   PATIENT SURVEYS:  FOTO TBA   COGNITION: Overall cognitive status: Within functional limits for tasks assessed                         SENSATION: WFL   MUSCLE LENGTH: Hamstrings: Right NT deg; Left NT deg Marcello Moores test: Right NT deg; Left NT deg   POSTURE: forward head   PALPATION: TTP to the L anterior/lateral thigh/hip and inguinal areas   LOWER EXTREMITY ROM:   Active ROM Right eval Left eval  Hip flexion   Decreased, limted by pain  Hip extension      Hip abduction   Decreased, limted by pain  Hip adduction        Hip internal rotation   Decreased, limted by pain  Hip external rotation   Decreased, limted by pain  Knee flexion 130d 112d,Decreased, limted by pain  Knee extension      Ankle dorsiflexion      Ankle  plantarflexion      Ankle inversion      Ankle eversion       (Blank rows = not tested)   LOWER EXTREMITY MMT:   MMT Right eval Left eval  Hip flexion 4 3 pain  Hip extension 4 3 pain  Hip abduction 4 3  Hip adduction      Hip internal rotation      Hip external rotation 4 3  Knee flexion 5 4+  Knee extension 5 4+  Ankle dorsiflexion      Ankle plantarflexion      Ankle inversion      Ankle eversion       (Blank rows = not tested)   LOWER EXTREMITY SPECIAL TESTS:  Hip special tests: Saralyn Pilar (FABER)  test: positive  and FADIR : Positive   FUNCTIONAL TESTS:  5 times sit to stand: 27.4 sec c use of hands from bariatric chair 2 minute walk test: TBA when less symptomatic   GAIT: Distance walked: 200' Assistive device utilized: None Level of assistance: Complete Independence Comments: moderate antalgic gait pattern initially after standing which decreases to minimal after walking approx 15 ft      TODAY'S TREATMENT: [facility] Adult PT Treatment:                                                DATE: 10/19/22 Therapeutic Exercise: Nustep x6 mins L4 UE/LE Supine marching 2x10 each SAQ 2x10 Off set bridging, L foot partial forward 1x10 Supine clam 2x10 GTB Supine hip add sets c ball x10 5 sec STS 2x5 Rest breaks needed between therex Manual Therapy: Gentle STM to the L anterior thigh. TrPs and taut muscles which were significantly TTP  Self Care: Use of moist heat for pain management Modalities:  Moist heat to the L anterior thigh x 15 mins                                                                                                                               [facility] Adult PT Treatment:                                                DATE: 10/14/22 Therapeutic Exercise: Supine marching 2x10 each SAQ 2x10 Off set bridging, L foot partial forward 1x10 Supine clam 2x10 GTB Supine hip add sets c ball x10 5 sec STS 2x5 Rest breaks needed between therex Manual  Therapy: Gentle STM to the L anterior thigh. TrPs and taut muscles which were significantly TTP  Self Care: Use of moist heat for pain management Modalities:  Moist heat to the L anterior thigh x 15 mins  [facility] Adult PT Treatment:                                                DATE: 10/12/22 Therapeutic Exercise: Supine marching 2x10 each SAQ 2x10 Off set bridging, L foot partial forward 2x10 Supine clam 2x10 GTB SL hip abd x4, pain ful and discontinued STS 2x5 Rest breaks needed between sets  Manual Therapy: Axial distraction of the L hip  Eval: No treatment provided today.    PATIENT EDUCATION:  Education details: Eval findings, POC, Self care; use of moist heat for pain management Person educated: Patient Education method: Explanation Education comprehension: verbalized understanding   HOME EXERCISE PROGRAM:  Access Code: [email or web address] URL: [email or web address] Date: 10/12/2022 Prepared by: Gar Ponto  Exercises - Supine Knee Extension Strengthening  - 1 x daily - 7 x weekly - 1 sets - 10 reps - 3 hold - Supine March  - 1 x daily - 7 x weekly - 1 sets - 20 reps - 2 hold - Hooklying Clamshell with Resistance  - 1 x daily - 7 x weekly - 1 sets - 10 reps - 3 hold - Supine Bridge  - 1 x daily - 7 x weekly - 1 sets - 10 reps - 3 hold - Sit to Stand with Counter Support  - 1 x daily - 7 x weekly - 3 sets - 5 reps   ASSESSMENT:   CLINICAL IMPRESSION: PT was completed for gentle STM to the L anterior thigh. Pt continues to be significantly TTP with TrPs and taut muscle bands were palpated. Pressure applied with the massage was light. Pt then completed therex for L LE/knee strengthening at a low demand level. Continued to discussed treatment options to help reduce the L thigh muscular pain including Estim and TPDN. Pt wants to wait until after the Thanksgiving before trying these options. Moist heat was applied post PT for pain pain management. Pt tolerated PT today  without adverse effects. Pt is to complete her HEP as tolerated and to use moist heat for pain management. Pt will continue to benefit from skilled PT to address impairments for improved function of the L LE c less pain.  OBJECTIVE IMPAIRMENTS: decreased activity tolerance, difficulty walking, decreased ROM, decreased strength, impaired flexibility, and pain.    ACTIVITY LIMITATIONS: carrying, lifting, bending, standing, squatting, stairs, dressing, locomotion level, and caring for others   PARTICIPATION LIMITATIONS: meal prep, cleaning, laundry, shopping, community activity, and church   PERSONAL FACTORS: Age, Fitness, and Time since onset of injury/illness/exacerbation are also affecting patient's functional outcome.    GOALS:   SHORT TERM GOALS: Target date: 10/26/2022  Pt will be Ind in an initial HEP Baseline: To be initiated Status: 10/19/22 Goal status: Ongoing    2.  Pt will voice understanding of measures to assist in pain reduction Baseline: Initiated Status: 10/19/22 Goal status: Ongoing    LONG TERM GOALS: Target date: 11/26/22   Pt will be Ind in a final HEP to maintain achieved LOF  Baseline: To be initiated Goal status: INITIAL   2.  Increase L hip strength to 4/5 and L knee strength to 5/5 for improved functional use of the L LE with decreased pain Baseline: see flow sheet Goal status: INITIAL   3.  Increase L knee flex ROM 125d for improved functional use of L LE with decreased pain Baseline: 112d Goal status: INITIAL   4.  Improve 5xSTS by 10" and 2MWT by MCID of 42f as indication of improved functional mobility  Baseline: 5xSTS=27.4 sec c use of hands from bariatric chair, 2MWT=TBA Goal status: INITIAL   5.  Pt will report a decrease in L LE pain to 3/10 or less with daily activities for improved function and QOL Baseline: 0-8/10 Goal status: INITIAL   PLAN:   PT FREQUENCY: 2x/week   PT DURATION: 6 weeks   PLANNED INTERVENTIONS: Therapeutic  exercises, Therapeutic activity, Balance training, Gait training, Patient/Family education, Self Care, Joint mobilization, Stair training, Aquatic Therapy, Dry Needling, Electrical stimulation, Cryotherapy, Moist heat, Taping, Vasopneumatic device, Ultrasound, Ionotophoresis 472mml Dexamethasone, Manual therapy, and Re-evaluation   PLAN FOR NEXT SESSION: Complete FOTO; develop HEP; progress therex as  indicated; use of modalities, manual therapy; and TPDN as indicated.  Korra Christine MS, PT 10/19/22 9:02 AM

## 2022-10-20 ENCOUNTER — Encounter: Payer: Self-pay | Admitting: Physical Therapy

## 2022-10-20 ENCOUNTER — Ambulatory Visit: Payer: Medicare Other | Admitting: Physical Therapy

## 2022-10-20 DIAGNOSIS — M79662 Pain in left lower leg: Secondary | ICD-10-CM

## 2022-10-20 DIAGNOSIS — M6281 Muscle weakness (generalized): Secondary | ICD-10-CM | POA: Diagnosis not present

## 2022-10-20 DIAGNOSIS — R262 Difficulty in walking, not elsewhere classified: Secondary | ICD-10-CM | POA: Diagnosis not present

## 2022-10-20 DIAGNOSIS — M1712 Unilateral primary osteoarthritis, left knee: Secondary | ICD-10-CM | POA: Diagnosis not present

## 2022-10-20 DIAGNOSIS — M79605 Pain in left leg: Secondary | ICD-10-CM | POA: Diagnosis not present

## 2022-10-20 DIAGNOSIS — M1612 Unilateral primary osteoarthritis, left hip: Secondary | ICD-10-CM | POA: Diagnosis not present

## 2022-10-20 NOTE — Therapy (Signed)
OUTPATIENT PHYSICAL THERAPY TREATMENT NOTE   Patient Name: [name] MRN: [id number] DOB:1951/03/21, 71 y.o., female Today's Date: 10/20/2022  PCP: [name], MD  REFERRING PROVIDER: Glennon Mac, DO   END Milas Kocher, MD  SESSION:   PT End of Session - 10/20/22 0804     Visit Number 5    Number of Visits 13    Date for PT Re-Evaluation 11/26/22    Authorization Type [employer] MEDICARE    PT Start Time 0804    PT Stop Time 0845    PT Time Calculation (min) 41 min               Past Medical History:  Diagnosis Date   Anemia    Arthritis    Cardiomyopathy ([place])    Cholelithiasis    Chronic back pain    Constipation    Esophageal reflux    Fibromyalgia    Hemorrhoids    Hyperlipidemia    Hypertension    Hypothyroid    IBS (irritable bowel syndrome)    Internal hemorrhoids without mention of complication    Left leg pain    Myocardial infarction [facility])    Personal history of colonic polyps 05/06/2010   ADENOMATOUS POLYP   Pneumonia    Tubular adenoma of colon    Past Surgical History:  Procedure Laterality Date   ABDOMINAL HYSTERECTOMY     CERVICAL DISC SURGERY     PLATE IN NECK & BACK   CHOLECYSTECTOMY     COLONOSCOPY  09/27/2011   NORMAL    COLONOSCOPY WITH PROPOFOL N/A 10/06/2020   Procedure: COLONOSCOPY WITH PROPOFOL;  Surgeon: Irving Copas., MD;  Location: [facility] ENDOSCOPY;  Service: Gastroenterology;  Laterality: N/A;  ultra slim scope    LEFT HEART CATH AND CORONARY ANGIOGRAPHY N/A 05/08/2018   Procedure: LEFT HEART CATH AND CORONARY ANGIOGRAPHY;  Surgeon: Belva Crome, MD;  Location: [place] [facility];  Service: Cardiovascular;  Laterality: N/A;   LUMBAR DISC SURGERY     POLYPECTOMY  10/06/2020   Procedure: POLYPECTOMY;  Surgeon: Mansouraty, Telford Nab., MD;  Location: [place];  Service: Gastroenterology;;   TONSILLECTOMY     Patient Active Problem List   Diagnosis Date Noted   Hypoglycemia associated with type 2  diabetes mellitus ([place]) 09/23/2022   Somnolence 09/23/2022   Flu vaccine need 09/14/2022   Sensorineural hearing loss (SNHL) of left ear with unrestricted hearing of right ear 07/21/2022   Primary osteoarthritis of left knee 12/24/2021   Onychomycosis of left great toe 12/14/2021   Type II diabetes mellitus with manifestations ([place]) 12/14/2021   [place] 05/21/2020   Allergic rhinitis 04/12/2020   Takotsubo cardiomyopathy 02/26/2019   NASH (nonalcoholic steatohepatitis) 01/22/2019   Chronic idiopathic constipation 01/12/2019   Arthritis of carpometacarpal ([place]) joint of left thumb 09/25/2018   Degenerative arthritis of left knee 05/24/2018   CAD (coronary artery disease) 05/16/2018   Vitamin D deficiency 11/11/2017   Degenerative disc disease, lumbar 08/29/2017   Obesity (BMI 30.0-34.9) 06/14/2016   Routine general medical examination at a health care facility 12/31/2014   Osteopenia 07/20/2013   Hypokalemia 06/14/2012   GERD (gastroesophageal reflux disease) 09/16/2011   Generalized anxiety disorder 09/16/2011   Constipation, slow transit 09/16/2011   Personal history of colonic polyps 08/17/2011   DJD (degenerative joint disease) of knee 06/25/2011   Pure hyperglyceridemia 06/24/2011   Hyperlipidemia LDL goal <70 07/30/2009   Hypothyroidism 04/25/2009   Essential hypertension 04/25/2009  REFERRING DIAG:  Left leg pain, Primary osteoarthritis of left knee, Primary osteoarthritis of left hip   THERAPY DIAG:  Pain in left lower leg  Muscle weakness (generalized)  Difficulty in walking, not elsewhere classified  Rationale for Evaluation and Treatment Rehabilitation  SUBJECTIVE:    SUBJECTIVE STATEMENT: Pt reports 6/10 pain in thigh today. She reported feeling okay after last session and still put heat on her thigh after PT.    PAIN:  Are you having pain? Yes: NPRS scale: 6/10 Pain location: L leg from the hip to her knee Pain description: Deep painful itch/ache (had  difficulty describing pain) Aggravating factors: Prolonged standing or walking Relieving factors: Rest, elevation 0-8/10  PERTINENT HISTORY: Baker's cyst; CAD, HTN   PRECAUTIONS: None   WEIGHT BEARING RESTRICTIONS: No   PATIENT GOALS: To have less pain with her L leg and for it to get stronger.   OBJECTIVE: (objective measures completed at initial evaluation unless otherwise dated)   DIAGNOSTIC FINDINGS: 08/04/22  FINDINGS: Cortical margins of the left femur are intact. There is no evidence of fracture or other focal bone lesions. Mild degenerative change of the hip and knee. Soft tissues are unremarkable.   IMPRESSION: No fracture or acute findings of the left femur. Minimal left hip and knee degenerative change.   FINDINGS: The cortical margins of the tibia and fibula are intact. There is no evidence of fracture or other focal bone lesions. Mild degenerative change of the knee. Ankle alignment is maintained. Soft tissues are unremarkable.   IMPRESSION: No fracture of the left lower leg.   PATIENT SURVEYS:  FOTO NOT CAPTURED   COGNITION: Overall cognitive status: Within functional limits for tasks assessed                         SENSATION: WFL   MUSCLE LENGTH: Hamstrings: Right NT deg; Left NT deg Marcello Moores test: Right NT deg; Left NT deg   POSTURE: forward head   PALPATION: TTP to the L anterior/lateral thigh/hip and inguinal areas   LOWER EXTREMITY ROM:   Active ROM Right eval Left eval Left 10/20/22  Hip flexion   Decreased, limted by pain 87  Hip extension       Hip abduction   Decreased, limted by pain   Hip adduction         Hip internal rotation   Decreased, limted by pain   Hip external rotation   Decreased, limted by pain   Knee flexion 130d 112d,Decreased, limted by pain 110  Knee extension       Ankle dorsiflexion       Ankle plantarflexion       Ankle inversion       Ankle eversion        (Blank rows = not tested)   LOWER EXTREMITY  MMT:   MMT Right eval Left eval  Hip flexion 4 3 pain  Hip extension 4 3 pain  Hip abduction 4 3  Hip adduction      Hip internal rotation      Hip external rotation 4 3  Knee flexion 5 4+  Knee extension 5 4+  Ankle dorsiflexion      Ankle plantarflexion      Ankle inversion      Ankle eversion       (Blank rows = not tested)   LOWER EXTREMITY SPECIAL TESTS:  Hip special tests: Saralyn Pilar (FABER) test: positive  and FADIR : Positive  FUNCTIONAL TESTS:  5 times sit to stand: 27.4 sec c use of hands from bariatric chair 2 minute walk test: TBA when less symptomatic: 10/20/22 351 Feet   GAIT: Distance walked: 200' Assistive device utilized: None Level of assistance: Complete Independence Comments: moderate antalgic gait pattern initially after standing which decreases to minimal after walking approx 15 ft    [facility] Adult PT Treatment:                                                DATE: 10/20/22 Therapeutic Exercise: Nustep L5 UE/Le x 5 minutes  2 MWT 351 Feet LAQ 5 sec x 10 SLR x 5 Hip flexor stretch EOM 20 sec x 2 Left heel slide x 5  Left SAQ x 10 Self Care: Use of massage Roller for STW left thigh      TODAY'S TREATMENT: [facility] Adult PT Treatment:                                                DATE: 10/19/22 Therapeutic Exercise: Nustep x6 mins L4 UE/LE Supine marching 2x10 each SAQ 2x10 Off set bridging, L foot partial forward 1x10 Supine clam 2x10 GTB Supine hip add sets c ball x10 5 sec STS 2x5 Rest breaks needed between therex Manual Therapy: Gentle STM to the L anterior thigh. TrPs and taut muscles which were significantly TTP  Self Care: Use of moist heat for pain management Modalities:  Moist heat to the L anterior thigh x 15 mins                                                                                                                               [facility] Adult PT Treatment:                                                DATE:  10/14/22 Therapeutic Exercise: Supine marching 2x10 each SAQ 2x10 Off set bridging, L foot partial forward 1x10 Supine clam 2x10 GTB Supine hip add sets c ball x10 5 sec STS 2x5 Rest breaks needed between therex Manual Therapy: Gentle STM to the L anterior thigh. TrPs and taut muscles which were significantly TTP  Self Care: Use of moist heat for pain management Modalities:  Moist heat to the L anterior thigh x 15 mins  [facility] Adult PT Treatment:  DATE: 10/12/22 Therapeutic Exercise: Supine marching 2x10 each SAQ 2x10 Off set bridging, L foot partial forward 2x10 Supine clam 2x10 GTB SL hip abd x4, pain ful and discontinued STS 2x5 Rest breaks needed between sets  Manual Therapy: Axial distraction of the L hip  Eval: No treatment provided today.    PATIENT EDUCATION:  Education details: Eval findings, POC, Self care; use of moist heat for pain management Person educated: Patient Education method: Explanation Education comprehension: verbalized understanding   HOME EXERCISE PROGRAM: Access Code: [email or web address] URL: [email or web address] Date: 10/12/2022 Prepared by: Gar Ponto  Exercises - Supine Knee Extension Strengthening  - 1 x daily - 7 x weekly - 1 sets - 10 reps - 3 hold - Supine March  - 1 x daily - 7 x weekly - 1 sets - 20 reps - 2 hold - Hooklying Clamshell with Resistance  - 1 x daily - 7 x weekly - 1 sets - 10 reps - 3 hold - Supine Bridge  - 1 x daily - 7 x weekly - 1 sets - 10 reps - 3 hold - Sit to Stand with Counter Support  - 1 x daily - 7 x weekly - 3 sets - 5 reps   ASSESSMENT:   CLINICAL IMPRESSION: PT was completed for self care instruction on use of massage roller vs hands to self massage thigh distal to proximal.  Pt then completed therex for L LE/knee strengthening at a low demand level.. Pt tolerated PT today without adverse effects. Pt is to complete her HEP as tolerated and to use  moist heat for pain management. Pt will continue to benefit from skilled PT to address impairments for improved function of the L LE c less pain.  OBJECTIVE IMPAIRMENTS: decreased activity tolerance, difficulty walking, decreased ROM, decreased strength, impaired flexibility, and pain.    ACTIVITY LIMITATIONS: carrying, lifting, bending, standing, squatting, stairs, dressing, locomotion level, and caring for others   PARTICIPATION LIMITATIONS: meal prep, cleaning, laundry, shopping, community activity, and church   PERSONAL FACTORS: Age, Fitness, and Time since onset of injury/illness/exacerbation are also affecting patient's functional outcome.    GOALS:   SHORT TERM GOALS: Target date: 10/26/2022  Pt will be Ind in an initial HEP Baseline: To be initiated Status: 10/19/22 Goal status: Ongoing    2.  Pt will voice understanding of measures to assist in pain reduction Baseline: Initiated Status: 10/19/22 Goal status: Ongoing    LONG TERM GOALS: Target date: 11/26/22   Pt will be Ind in a final HEP to maintain achieved LOF  Baseline: To be initiated Goal status: INITIAL   2.  Increase L hip strength to 4/5 and L knee strength to 5/5 for improved functional use of the L LE with decreased pain Baseline: see flow sheet Goal status: INITIAL   3.  Increase L knee flex ROM 125d for improved functional use of L LE with decreased pain Baseline: 112d Goal status: INITIAL   4.  Improve 5xSTS by 10" and 2MWT by MCID of 19f as indication of improved functional mobility  Baseline: 5xSTS=27.4 sec c use of hands from bariatric chair, 2MWT=TBA Goal status: INITIAL   5.  Pt will report a decrease in L LE pain to 3/10 or less with daily activities for improved function and QOL Baseline: 0-8/10 Goal status: INITIAL   PLAN:   PT FREQUENCY: 2x/week   PT DURATION: 6 weeks   PLANNED INTERVENTIONS: Therapeutic exercises, Therapeutic activity, Balance training, Gait training,  Patient/Family education, Self  Care, Joint mobilization, Stair training, Aquatic Therapy, Dry Needling, Electrical stimulation, Cryotherapy, Moist heat, Taping, Vasopneumatic device, Ultrasound, Ionotophoresis 33m/ml Dexamethasone, Manual therapy, and Re-evaluation   PLAN FOR NEXT SESSION:  develop HEP; progress therex as indicated; use of modalities, manual therapy; and TPDN as indicated.  JHessie Diener PTA 10/20/22 9:50 AM Phone: [phone]: [phone]

## 2022-10-26 ENCOUNTER — Ambulatory Visit: Payer: Medicare Other | Admitting: Physical Therapy

## 2022-10-26 ENCOUNTER — Encounter: Payer: Self-pay | Admitting: Physical Therapy

## 2022-10-26 DIAGNOSIS — M1612 Unilateral primary osteoarthritis, left hip: Secondary | ICD-10-CM | POA: Diagnosis not present

## 2022-10-26 DIAGNOSIS — R262 Difficulty in walking, not elsewhere classified: Secondary | ICD-10-CM

## 2022-10-26 DIAGNOSIS — M79662 Pain in left lower leg: Secondary | ICD-10-CM

## 2022-10-26 DIAGNOSIS — M1712 Unilateral primary osteoarthritis, left knee: Secondary | ICD-10-CM | POA: Diagnosis not present

## 2022-10-26 DIAGNOSIS — M6281 Muscle weakness (generalized): Secondary | ICD-10-CM | POA: Diagnosis not present

## 2022-10-26 DIAGNOSIS — M79605 Pain in left leg: Secondary | ICD-10-CM | POA: Diagnosis not present
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
## 2022-10-26 NOTE — Therapy (Signed)
OUTPATIENT PHYSICAL THERAPY TREATMENT NOTE   Patient Name: [name] MRN: [id number] DOB:06-Jan-1951, 71 y.o., female Today's Date: 10/26/2022  PCP: [name], MD  REFERRING PROVIDER: Glennon Mac, DO   END Milas Kocher, MD  SESSION:   PT End of Session - 10/26/22 0754     Visit Number 6    Number of Visits 13    Date for PT Re-Evaluation 11/26/22    Authorization Type [employer] MEDICARE    PT Start Time 2841    PT Stop Time 0835    PT Time Calculation (min) 40 min               Past Medical History:  Diagnosis Date   Anemia    Arthritis    Cardiomyopathy ([place])    Cholelithiasis    Chronic back pain    Constipation    Esophageal reflux    Fibromyalgia    Hemorrhoids    Hyperlipidemia    Hypertension    Hypothyroid    IBS (irritable bowel syndrome)    Internal hemorrhoids without mention of complication    Left leg pain    Myocardial infarction [facility])    Personal history of colonic polyps 05/06/2010   ADENOMATOUS POLYP   Pneumonia    Tubular adenoma of colon    Past Surgical History:  Procedure Laterality Date   ABDOMINAL HYSTERECTOMY     CERVICAL DISC SURGERY     PLATE IN NECK & BACK   CHOLECYSTECTOMY     COLONOSCOPY  09/27/2011   NORMAL    COLONOSCOPY WITH PROPOFOL N/A 10/06/2020   Procedure: COLONOSCOPY WITH PROPOFOL;  Surgeon: Irving Copas., MD;  Location: [facility] ENDOSCOPY;  Service: Gastroenterology;  Laterality: N/A;  ultra slim scope    LEFT HEART CATH AND CORONARY ANGIOGRAPHY N/A 05/08/2018   Procedure: LEFT HEART CATH AND CORONARY ANGIOGRAPHY;  Surgeon: Belva Crome, MD;  Location: [place] CV LAB;  Service: Cardiovascular;  Laterality: N/A;   LUMBAR DISC SURGERY     POLYPECTOMY  10/06/2020   Procedure: POLYPECTOMY;  Surgeon: Mansouraty, Telford Nab., MD;  Location: [place];  Service: Gastroenterology;;   TONSILLECTOMY     Patient Active Problem List   Diagnosis Date Noted   Hypoglycemia associated with type 2  diabetes mellitus ([place]) 09/23/2022   Somnolence 09/23/2022   Flu vaccine need 09/14/2022   Sensorineural hearing loss (SNHL) of left ear with unrestricted hearing of right ear 07/21/2022   Primary osteoarthritis of left knee 12/24/2021   Onychomycosis of left great toe 12/14/2021   Type II diabetes mellitus with manifestations ([place]) 12/14/2021   [place] 05/21/2020   Allergic rhinitis 04/12/2020   Takotsubo cardiomyopathy 02/26/2019   NASH (nonalcoholic steatohepatitis) 01/22/2019   Chronic idiopathic constipation 01/12/2019   Arthritis of carpometacarpal ([place]) joint of left thumb 09/25/2018   Degenerative arthritis of left knee 05/24/2018   CAD (coronary artery disease) 05/16/2018   Vitamin D deficiency 11/11/2017   Degenerative disc disease, lumbar 08/29/2017   Obesity (BMI 30.0-34.9) 06/14/2016   Routine general medical examination at a health care facility 12/31/2014   Osteopenia 07/20/2013   Hypokalemia 06/14/2012   GERD (gastroesophageal reflux disease) 09/16/2011   Generalized anxiety disorder 09/16/2011   Constipation, slow transit 09/16/2011   Personal history of colonic polyps 08/17/2011   DJD (degenerative joint disease) of knee 06/25/2011   Pure hyperglyceridemia 06/24/2011   Hyperlipidemia LDL goal <70 07/30/2009   Hypothyroidism 04/25/2009   Essential hypertension 04/25/2009  REFERRING DIAG:  Left leg pain, Primary osteoarthritis of left knee, Primary osteoarthritis of left hip   THERAPY DIAG:  Pain in left lower leg  Difficulty in walking, not elsewhere classified  Muscle weakness (generalized)  Rationale for Evaluation and Treatment Rehabilitation  SUBJECTIVE:    SUBJECTIVE STATEMENT: Pt reports she is doing better. Pain in thigh 4/10.  6/10 pain in thigh today.   PAIN:  Are you having pain? Yes: NPRS scale: 4/10 Pain location: L leg from the hip to her knee Pain description: Deep painful itch/ache (had difficulty describing pain) Aggravating  factors: Prolonged standing or walking Relieving factors: Rest, elevation 0-8/10  PERTINENT HISTORY: Baker's cyst; CAD, HTN   PRECAUTIONS: None   WEIGHT BEARING RESTRICTIONS: No   PATIENT GOALS: To have less pain with her L leg and for it to get stronger.   OBJECTIVE: (objective measures completed at initial evaluation unless otherwise dated)   DIAGNOSTIC FINDINGS: 08/04/22  FINDINGS: Cortical margins of the left femur are intact. There is no evidence of fracture or other focal bone lesions. Mild degenerative change of the hip and knee. Soft tissues are unremarkable.   IMPRESSION: No fracture or acute findings of the left femur. Minimal left hip and knee degenerative change.   FINDINGS: The cortical margins of the tibia and fibula are intact. There is no evidence of fracture or other focal bone lesions. Mild degenerative change of the knee. Ankle alignment is maintained. Soft tissues are unremarkable.   IMPRESSION: No fracture of the left lower leg.   PATIENT SURVEYS:  FOTO NOT CAPTURED   COGNITION: Overall cognitive status: Within functional limits for tasks assessed                         SENSATION: WFL   MUSCLE LENGTH: Hamstrings: Right NT deg; Left NT deg Marcello Moores test: Right NT deg; Left NT deg   POSTURE: forward head   PALPATION: TTP to the L anterior/lateral thigh/hip and inguinal areas   LOWER EXTREMITY ROM:   Active ROM Right eval Left eval Left 10/20/22  Hip flexion   Decreased, limted by pain 87  Hip extension       Hip abduction   Decreased, limted by pain   Hip adduction         Hip internal rotation   Decreased, limted by pain   Hip external rotation   Decreased, limted by pain   Knee flexion 130d 112d,Decreased, limted by pain 110  Knee extension       Ankle dorsiflexion       Ankle plantarflexion       Ankle inversion       Ankle eversion        (Blank rows = not tested)   LOWER EXTREMITY MMT:   MMT Right eval Left eval  Hip  flexion 4 3 pain  Hip extension 4 3 pain  Hip abduction 4 3  Hip adduction      Hip internal rotation      Hip external rotation 4 3  Knee flexion 5 4+  Knee extension 5 4+  Ankle dorsiflexion      Ankle plantarflexion      Ankle inversion      Ankle eversion       (Blank rows = not tested)   LOWER EXTREMITY SPECIAL TESTS:  Hip special tests: Saralyn Pilar (FABER) test: positive  and FADIR : Positive   FUNCTIONAL TESTS:  5 times sit to stand:  27.4 sec c use of hands from bariatric chair 2 minute walk test: TBA when less symptomatic: 10/20/22 351 Feet   GAIT: Distance walked: 200' Assistive device utilized: None Level of assistance: Complete Independence Comments: moderate antalgic gait pattern initially after standing which decreases to minimal after walking approx 15 ft    [facility] Adult PT Treatment:                                                DATE: 10/26/22 Therapeutic Exercise: Nustep L5  UE/LE x 5  min STS x 10, bariatric LAQ 3#  5 x 2 each Hip flexor EOM x 30 sec Left heel slide on sliding board  Left QS into towel 5 sec x 10 Off set bridge x 10  SKTC AAROM using towel, bilat Red band clam 10 x 2  Bilat SAQ 3#  Seated Assisted knee flexion, ankles crossed.    [facility] Adult PT Treatment:                                                DATE: 10/20/22 Therapeutic Exercise: Nustep L5 UE/Le x 5 minutes  2 MWT 351 Feet LAQ 5 sec x 10 SLR x 5 Hip flexor stretch EOM 20 sec x 2 Left heel slide x 5  Left SAQ x 10 Self Care: Use of massage Roller for STW left thigh      TODAY'S TREATMENT: [facility] Adult PT Treatment:                                                DATE: 10/19/22 Therapeutic Exercise: Nustep x6 mins L4 UE/LE Supine marching 2x10 each SAQ 2x10 Off set bridging, L foot partial forward 1x10 Supine clam 2x10 GTB Supine hip add sets c ball x10 5 sec STS 2x5 Rest breaks needed between therex Manual Therapy: Gentle STM to the L anterior thigh. TrPs and taut  muscles which were significantly TTP  Self Care: Use of moist heat for pain management Modalities:  Moist heat to the L anterior thigh x 15 mins                                                                                                                               [facility] Adult PT Treatment:  DATE: 10/14/22 Therapeutic Exercise: Supine marching 2x10 each SAQ 2x10 Off set bridging, L foot partial forward 1x10 Supine clam 2x10 GTB Supine hip add sets c ball x10 5 sec STS 2x5 Rest breaks needed between therex Manual Therapy: Gentle STM to the L anterior thigh. TrPs and taut muscles which were significantly TTP  Self Care: Use of moist heat for pain management Modalities:  Moist heat to the L anterior thigh x 15 mins  [facility] Adult PT Treatment:                                                DATE: 10/12/22 Therapeutic Exercise: Supine marching 2x10 each SAQ 2x10 Off set bridging, L foot partial forward 2x10 Supine clam 2x10 GTB SL hip abd x4, pain ful and discontinued STS 2x5 Rest breaks needed between sets  Manual Therapy: Axial distraction of the L hip  Eval: No treatment provided today.    PATIENT EDUCATION:  Education details: Eval findings, POC, Self care; use of moist heat for pain management Person educated: Patient Education method: Explanation Education comprehension: verbalized understanding   HOME EXERCISE PROGRAM: Access Code: [email or web address] URL: [email or web address] Date: 10/12/2022 Prepared by: Gar Ponto  Exercises - Supine Knee Extension Strengthening  - 1 x daily - 7 x weekly - 1 sets - 10 reps - 3 hold - Supine March  - 1 x daily - 7 x weekly - 1 sets - 20 reps - 2 hold - Hooklying Clamshell with Resistance  - 1 x daily - 7 x weekly - 1 sets - 10 reps - 3 hold - Supine Bridge  - 1 x daily - 7 x weekly - 1 sets - 10 reps - 3 hold - Sit to Stand with Counter Support  - 1 x daily - 7 x weekly - 3  sets - 5 reps   ASSESSMENT:   CLINICAL IMPRESSION: Pt reports her pain is improved. She attributes the improvement to heat and self massage. She continues to have thick/tight bands of tissue in anterior thigh and is tender sensitive to light touch. She does show improved tolerance to therex today although ROM of knee and hip unchanged since last measurement. She reports compliance with HEP and reports that the exercises do make her hurt more. No change to HEP today.  Pt will continue to benefit from skilled PT to address impairments for improved function of the L LE c less pain.  OBJECTIVE IMPAIRMENTS: decreased activity tolerance, difficulty walking, decreased ROM, decreased strength, impaired flexibility, and pain.    ACTIVITY LIMITATIONS: carrying, lifting, bending, standing, squatting, stairs, dressing, locomotion level, and caring for others   PARTICIPATION LIMITATIONS: meal prep, cleaning, laundry, shopping, community activity, and church   PERSONAL FACTORS: Age, Fitness, and Time since onset of injury/illness/exacerbation are also affecting patient's functional outcome.    GOALS:   SHORT TERM GOALS: Target date: 10/26/2022  Pt will be Ind in an initial HEP Baseline: To be initiated Status: 10/26/22 Goal status: MET    2.  Pt will voice understanding of measures to assist in pain reduction Baseline: Initiated Status: 10/26/22 Goal status: MET   LONG TERM GOALS: Target date: 11/26/22   Pt will be Ind in a final HEP to maintain achieved LOF  Baseline: To be initiated Goal status: INITIAL   2.  Increase L hip strength to 4/5 and L  knee strength to 5/5 for improved functional use of the L LE with decreased pain Baseline: see flow sheet Goal status: INITIAL   3.  Increase L knee flex ROM 125d for improved functional use of L LE with decreased pain Baseline: 112d Goal status: INITIAL   4.  Improve 5xSTS by 10" and 2MWT by MCID of 78f as indication of improved functional  mobility  Baseline: 5xSTS=27.4 sec c use of hands from bariatric chair, 2MWT=351 Ft Goal status: INITIAL   5.  Pt will report a decrease in L LE pain to 3/10 or less with daily activities for improved function and QOL Baseline: 0-8/10 Goal status: INITIAL   PLAN:   PT FREQUENCY: 2x/week   PT DURATION: 6 weeks   PLANNED INTERVENTIONS: Therapeutic exercises, Therapeutic activity, Balance training, Gait training, Patient/Family education, Self Care, Joint mobilization, Stair training, Aquatic Therapy, Dry Needling, Electrical stimulation, Cryotherapy, Moist heat, Taping, Vasopneumatic device, Ultrasound, Ionotophoresis 462mml Dexamethasone, Manual therapy, and Re-evaluation   PLAN FOR NEXT SESSION:  develop HEP; progress therex as indicated; use of modalities, manual therapy; and TPDN as indicated.  JeHessie DienerPTA 10/26/22 12:04 PM Phone: [phone]: [phone]

## 2022-10-27 ENCOUNTER — Ambulatory Visit (INDEPENDENT_AMBULATORY_CARE_PROVIDER_SITE_OTHER): Payer: Medicare Other | Admitting: Sports Medicine

## 2022-10-27 VITALS — HR 64 | Ht 67.0 in | Wt 191.0 lb

## 2022-10-27 DIAGNOSIS — M1712 Unilateral primary osteoarthritis, left knee: Secondary | ICD-10-CM | POA: Diagnosis not present

## 2022-10-27 DIAGNOSIS — M1612 Unilateral primary osteoarthritis, left hip: Secondary | ICD-10-CM

## 2022-10-27 DIAGNOSIS — M79605 Pain in left leg: Secondary | ICD-10-CM | POA: Diagnosis not present

## 2022-10-27 NOTE — Therapy (Signed)
OUTPATIENT PHYSICAL THERAPY TREATMENT NOTE   Patient Name: [name] MRN: [id number] DOB:09-Jun-1951, 71 y.o., female Today's Date: 10/28/2022  PCP: [name], MD  REFERRING PROVIDER: Glennon Mac, DO   END Milas Kocher, MD  SESSION:   PT End of Session - 10/28/22 [phone]     Visit Number 7    Number of Visits 13    Date for PT Re-Evaluation 11/26/22    Authorization Type [employer] MEDICARE    Progress Note Due on Visit 10    PT Start Time 0805    PT Stop Time 0850    PT Time Calculation (min) 45 min    Activity Tolerance Patient tolerated treatment well;Patient limited by pain    Behavior During Therapy [facility] for tasks assessed/performed               Past Medical History:  Diagnosis Date   Anemia    Arthritis    Cardiomyopathy ([place])    Cholelithiasis    Chronic back pain    Constipation    Esophageal reflux    Fibromyalgia    Hemorrhoids    Hyperlipidemia    Hypertension    Hypothyroid    IBS (irritable bowel syndrome)    Internal hemorrhoids without mention of complication    Left leg pain    Myocardial infarction [facility])    Personal history of colonic polyps 05/06/2010   ADENOMATOUS POLYP   Pneumonia    Tubular adenoma of colon    Past Surgical History:  Procedure Laterality Date   ABDOMINAL HYSTERECTOMY     CERVICAL DISC SURGERY     PLATE IN NECK & BACK   CHOLECYSTECTOMY     COLONOSCOPY  09/27/2011   NORMAL    COLONOSCOPY WITH PROPOFOL N/A 10/06/2020   Procedure: COLONOSCOPY WITH PROPOFOL;  Surgeon: Irving Copas., MD;  Location: [facility] ENDOSCOPY;  Service: Gastroenterology;  Laterality: N/A;  ultra slim scope    LEFT HEART CATH AND CORONARY ANGIOGRAPHY N/A 05/08/2018   Procedure: LEFT HEART CATH AND CORONARY ANGIOGRAPHY;  Surgeon: Belva Crome, MD;  Location: [place] [facility];  Service: Cardiovascular;  Laterality: N/A;   LUMBAR DISC SURGERY     POLYPECTOMY  10/06/2020   Procedure: POLYPECTOMY;  Surgeon: Mansouraty, Telford Nab.,  MD;  Location: [place];  Service: Gastroenterology;;   TONSILLECTOMY     Patient Active Problem List   Diagnosis Date Noted   Hypoglycemia associated with type 2 diabetes mellitus ([place]) 09/23/2022   Somnolence 09/23/2022   Flu vaccine need 09/14/2022   Sensorineural hearing loss (SNHL) of left ear with unrestricted hearing of right ear 07/21/2022   Primary osteoarthritis of left knee 12/24/2021   Onychomycosis of left great toe 12/14/2021   Type II diabetes mellitus with manifestations ([place]) 12/14/2021   [place] 05/21/2020   Allergic rhinitis 04/12/2020   Takotsubo cardiomyopathy 02/26/2019   NASH (nonalcoholic steatohepatitis) 01/22/2019   Chronic idiopathic constipation 01/12/2019   Arthritis of carpometacarpal ([place]) joint of left thumb 09/25/2018   Degenerative arthritis of left knee 05/24/2018   CAD (coronary artery disease) 05/16/2018   Vitamin D deficiency 11/11/2017   Degenerative disc disease, lumbar 08/29/2017   Obesity (BMI 30.0-34.9) 06/14/2016   Routine general medical examination at a health care facility 12/31/2014   Osteopenia 07/20/2013   Hypokalemia 06/14/2012   GERD (gastroesophageal reflux disease) 09/16/2011   Generalized anxiety disorder 09/16/2011   Constipation, slow transit 09/16/2011   Personal history of colonic polyps 08/17/2011  DJD (degenerative joint disease) of knee 06/25/2011   Pure hyperglyceridemia 06/24/2011   Hyperlipidemia LDL goal <70 07/30/2009   Hypothyroidism 04/25/2009   Essential hypertension 04/25/2009    REFERRING DIAG:  Left leg pain, Primary osteoarthritis of left knee, Primary osteoarthritis of left hip   THERAPY DIAG:  Pain in left lower leg  Difficulty in walking, not elsewhere classified  Muscle weakness (generalized)  Rationale for Evaluation and Treatment Rehabilitation  SUBJECTIVE:    SUBJECTIVE STATEMENT: Pt reports overall the L thigh pain is better. Pain is intermittent and still can be at high levels.    PAIN:  Are you having pain? Yes: NPRS scale: 0/10 Pain location: L leg from the hip to her knee Pain description: Deep painful itch/ache (had difficulty describing pain) Aggravating factors: Prolonged standing or walking Relieving factors: Rest, elevation 0-8/10  PERTINENT HISTORY: Baker's cyst; CAD, HTN   PRECAUTIONS: None   WEIGHT BEARING RESTRICTIONS: No   PATIENT GOALS: To have less pain with her L leg and for it to get stronger.   OBJECTIVE: (objective measures completed at initial evaluation unless otherwise dated)   DIAGNOSTIC FINDINGS: 08/04/22  FINDINGS: Cortical margins of the left femur are intact. There is no evidence of fracture or other focal bone lesions. Mild degenerative change of the hip and knee. Soft tissues are unremarkable.   IMPRESSION: No fracture or acute findings of the left femur. Minimal left hip and knee degenerative change.   FINDINGS: The cortical margins of the tibia and fibula are intact. There is no evidence of fracture or other focal bone lesions. Mild degenerative change of the knee. Ankle alignment is maintained. Soft tissues are unremarkable.   IMPRESSION: No fracture of the left lower leg.   PATIENT SURVEYS:  FOTO NOT CAPTURED   COGNITION: Overall cognitive status: Within functional limits for tasks assessed                         SENSATION: WFL   MUSCLE LENGTH: Hamstrings: Right NT deg; Left NT deg Marcello Moores test: Right NT deg; Left NT deg   POSTURE: forward head   PALPATION: TTP to the L anterior/lateral thigh/hip and inguinal areas   LOWER EXTREMITY ROM:   Active ROM Right eval Left eval Left 10/20/22  Hip flexion   Decreased, limted by pain 87  Hip extension       Hip abduction   Decreased, limted by pain   Hip adduction         Hip internal rotation   Decreased, limted by pain   Hip external rotation   Decreased, limted by pain   Knee flexion 130d 112d,Decreased, limted by pain 110  Knee extension        Ankle dorsiflexion       Ankle plantarflexion       Ankle inversion       Ankle eversion        (Blank rows = not tested)   LOWER EXTREMITY MMT:   MMT Right eval Left eval  Hip flexion 4 3 pain  Hip extension 4 3 pain  Hip abduction 4 3  Hip adduction      Hip internal rotation      Hip external rotation 4 3  Knee flexion 5 4+  Knee extension 5 4+  Ankle dorsiflexion      Ankle plantarflexion      Ankle inversion      Ankle eversion       (  Blank rows = not tested)   LOWER EXTREMITY SPECIAL TESTS:  Hip special tests: Saralyn Pilar (FABER) test: positive  and FADIR : Positive   FUNCTIONAL TESTS:  5 times sit to stand: 27.4 sec c use of hands from bariatric chair 2 minute walk test: TBA when less symptomatic: 10/20/22 351 Feet   GAIT: Distance walked: 200' Assistive device utilized: None Level of assistance: Complete Independence Comments: moderate antalgic gait pattern initially after standing which decreases to minimal after walking approx 15 ft   [facility] Adult PT Treatment:                                                DATE: 10/28/22 Therapeutic Exercise: Nustep L6  UE/LE x 5  min L Hip/knee flexion heel slides 2x10  Left QS into towel 5 sec x 10 Off set bridge x 10  SAQ x10 Red band clam 10 x 2  Manual Therapy: STM to the L rectus femoris and vastus lateralis Skilled palpation to identify TrPs and taut muscle bands Modalities: Moist heat x15 minsto the L thigh  Trigger Point Dry Needling Treatment: Pre-treatment instruction: Patient instructed on dry needling rationale, procedures, and possible side effects including pain during treatment (achy,cramping feeling), bruising, drop of blood, lightheadedness, nausea, sweating. Patient Consent Given: Yes Education handout provided: Yes Muscles treated: L rectus femoris and vastus lateralis  Needle size and number: .30x14m x 1 Electrical stimulation performed: No Parameters: N/A Treatment response/outcome: Twitch  response elicited Post-treatment instructions: Patient instructed to expect possible mild to moderate muscle soreness later today and/or tomorrow. Patient instructed in methods to reduce muscle soreness and to continue prescribed HEP. If patient was dry needled over the lung field, patient was instructed on signs and symptoms of pneumothorax and, however unlikely, to see immediate medical attention should they occur. Patient was also educated on signs and symptoms of infection and to seek medical attention should they occur. Patient verbalized understanding of these instructions and education.  [facility] PT Treatment:                                                DATE: 10/26/22 Therapeutic Exercise: Nustep L5  UE/LE x 5  min STS x 10, bariatric LAQ 3#  5 x 2 each Hip flexor EOM x 30 sec Left heel slide on sliding board  Left QS into towel 5 sec x 10 Off set bridge x 10  SKTC AAROM using towel, bilat Red band clam 10 x 2  Bilat SAQ 3#  Seated Assisted knee flexion, ankles crossed.    [facility]                                                DATE: 10/20/22 Therapeutic Exercise: Nustep L5 UE/Le x 5 minutes  2 MWT 351 Feet LAQ 5 sec x 10 SLR x 5 Hip flexor stretch EOM 20 sec x 2 Left heel slide x 5  Left SAQ x 10 Self Care: Use of massage Roller for STW left thigh  PATIENT EDUCATION:  Education details: Eval findings, POC, Self care; use of moist heat for pain management Person educated: Patient Education method: Explanation Education comprehension: verbalized understanding   HOME EXERCISE PROGRAM: Access Code: [email or web address] URL: [email or web address] Date: 10/12/2022 Prepared by: Gar Ponto  Exercises - Supine Knee Extension Strengthening  - 1 x daily - 7 x weekly - 1 sets - 10 reps - 3 hold - Supine March  - 1 x daily - 7 x  weekly - 1 sets - 20 reps - 2 hold - Hooklying Clamshell with Resistance  - 1 x daily - 7 x weekly - 1 sets - 10 reps - 3 hold - Supine Bridge  - 1 x daily - 7 x weekly - 1 sets - 10 reps - 3 hold - Sit to Stand with Counter Support  - 1 x daily - 7 x weekly - 3 sets - 5 reps   ASSESSMENT:   CLINICAL IMPRESSION: PT was completed for STM to the L thigh f/b TPDN to the rectus femoris and vastus lateralis. Therex for quad flexibility and activation was then completed. Moist heat was then use to assist with soreness management following the session. Pt tolerated PT today without adverse effects. Will assess pt's complete response to the TPDN the nest PT session. Pt will continue to benefit from skilled PT to address impairments for improved function of the L LE c less pain.  OBJECTIVE IMPAIRMENTS: decreased activity tolerance, difficulty walking, decreased ROM, decreased strength, impaired flexibility, and pain.    ACTIVITY LIMITATIONS: carrying, lifting, bending, standing, squatting, stairs, dressing, locomotion level, and caring for others   PARTICIPATION LIMITATIONS: meal prep, cleaning, laundry, shopping, community activity, and church   PERSONAL FACTORS: Age, Fitness, and Time since onset of injury/illness/exacerbation are also affecting patient's functional outcome.    GOALS:   SHORT TERM GOALS: Target date: 10/26/2022  Pt will be Ind in an initial HEP Baseline: To be initiated Status: 10/26/22 Goal status: MET    2.  Pt will voice understanding of measures to assist in pain reduction Baseline: Initiated Status: 10/26/22 Goal status: MET   LONG TERM GOALS: Target date: 11/26/22   Pt will be Ind in a final HEP to maintain achieved LOF  Baseline: To be initiated Goal status: INITIAL   2.  Increase L hip strength to 4/5 and L knee strength to 5/5 for improved functional use of the L LE with decreased pain Baseline: see flow sheet Goal status: INITIAL   3.  Increase L knee  flex ROM 125d for improved functional use of L LE with decreased pain Baseline: 112d Goal status: INITIAL   4.  Improve 5xSTS by 10" and 2MWT by MCID of 53f as indication of improved functional mobility  Baseline: 5xSTS=27.4 sec c use of hands from bariatric chair, 2MWT=351 Ft Goal status: INITIAL   5.  Pt will report a decrease in L LE pain to 3/10 or less with daily activities for improved function and QOL Baseline: 0-8/10 Goal status: INITIAL   PLAN:   PT FREQUENCY: 2x/week   PT DURATION: 6 weeks   PLANNED INTERVENTIONS: Therapeutic exercises, Therapeutic activity, Balance training, Gait training, Patient/Family education, Self Care, Joint mobilization, Stair training, Aquatic Therapy, Dry Needling, Electrical stimulation, Cryotherapy, Moist heat, Taping, Vasopneumatic device, Ultrasound, Ionotophoresis 442mml Dexamethasone, Manual therapy, and Re-evaluation   PLAN FOR NEXT SESSION:  develop HEP; progress therex as indicated; use of modalities, manual therapy; and TPDN as indicated. Assess LTGs and response of TPDN  Mersadez Linden MS, PT 10/28/22 10:09 AM

## 2022-10-27 NOTE — Patient Instructions (Addendum)
Good to see you  Recommend Tylenol [phone] mg 2-3 times a day for pain relief  Recommend Voltaren gel over areas of pain  Continue PT  4-6 week follow up

## 2022-10-28 ENCOUNTER — Ambulatory Visit: Payer: Medicare Other

## 2022-10-28 DIAGNOSIS — M79605 Pain in left leg: Secondary | ICD-10-CM | POA: Diagnosis not present

## 2022-10-28 DIAGNOSIS — R262 Difficulty in walking, not elsewhere classified: Secondary | ICD-10-CM

## 2022-10-28 DIAGNOSIS — M79662 Pain in left lower leg: Secondary | ICD-10-CM

## 2022-10-28 DIAGNOSIS — M6281 Muscle weakness (generalized): Secondary | ICD-10-CM | POA: Diagnosis not present

## 2022-10-28 DIAGNOSIS — M1712 Unilateral primary osteoarthritis, left knee: Secondary | ICD-10-CM | POA: Diagnosis not present

## 2022-10-28 DIAGNOSIS — M1612 Unilateral primary osteoarthritis, left hip: Secondary | ICD-10-CM | POA: Diagnosis not present

## 2022-10-28 NOTE — Patient Instructions (Signed)

## 2022-10-29 ENCOUNTER — Other Ambulatory Visit: Payer: Self-pay | Admitting: Internal Medicine

## 2022-10-29 DIAGNOSIS — J302 Other seasonal allergic rhinitis: Secondary | ICD-10-CM

## 2022-11-02 NOTE — Therapy (Signed)
OUTPATIENT PHYSICAL THERAPY TREATMENT NOTE   Patient Name: [name] MRN: [id number] DOB:04/08/1951, 71 y.o., female Today's Date: 11/03/2022  PCP: [name], MD  REFERRING PROVIDER: Glennon Mac, DO   END Milas Kocher, MD  SESSION:   PT End of Session - 11/03/22 [phone]     Visit Number 8    Number of Visits 13    Date for PT Re-Evaluation 11/26/22    Authorization Type [employer] MEDICARE    PT Start Time 0804    PT Stop Time 0848    PT Time Calculation (min) 44 min    Activity Tolerance Patient tolerated treatment well;Patient limited by pain    Behavior During Therapy [facility] for tasks assessed/performed                Past Medical History:  Diagnosis Date   Anemia    Arthritis    Cardiomyopathy ([place])    Cholelithiasis    Chronic back pain    Constipation    Esophageal reflux    Fibromyalgia    Hemorrhoids    Hyperlipidemia    Hypertension    Hypothyroid    IBS (irritable bowel syndrome)    Internal hemorrhoids without mention of complication    Left leg pain    Myocardial infarction [facility])    Personal history of colonic polyps 05/06/2010   ADENOMATOUS POLYP   Pneumonia    Tubular adenoma of colon    Past Surgical History:  Procedure Laterality Date   ABDOMINAL HYSTERECTOMY     CERVICAL DISC SURGERY     PLATE IN NECK & BACK   CHOLECYSTECTOMY     COLONOSCOPY  09/27/2011   NORMAL    COLONOSCOPY WITH PROPOFOL N/A 10/06/2020   Procedure: COLONOSCOPY WITH PROPOFOL;  Surgeon: Irving Copas., MD;  Location: [facility] ENDOSCOPY;  Service: Gastroenterology;  Laterality: N/A;  ultra slim scope    LEFT HEART CATH AND CORONARY ANGIOGRAPHY N/A 05/08/2018   Procedure: LEFT HEART CATH AND CORONARY ANGIOGRAPHY;  Surgeon: Belva Crome, MD;  Location: [place] [facility];  Service: Cardiovascular;  Laterality: N/A;   LUMBAR DISC SURGERY     POLYPECTOMY  10/06/2020   Procedure: POLYPECTOMY;  Surgeon: Mansouraty, Telford Nab., MD;  Location: [place];   Service: Gastroenterology;;   TONSILLECTOMY     Patient Active Problem List   Diagnosis Date Noted   Hypoglycemia associated with type 2 diabetes mellitus ([place]) 09/23/2022   Somnolence 09/23/2022   Flu vaccine need 09/14/2022   Sensorineural hearing loss (SNHL) of left ear with unrestricted hearing of right ear 07/21/2022   Primary osteoarthritis of left knee 12/24/2021   Onychomycosis of left great toe 12/14/2021   Type II diabetes mellitus with manifestations ([place]) 12/14/2021   [place] 05/21/2020   Allergic rhinitis 04/12/2020   Takotsubo cardiomyopathy 02/26/2019   NASH (nonalcoholic steatohepatitis) 01/22/2019   Chronic idiopathic constipation 01/12/2019   Arthritis of carpometacarpal ([place]) joint of left thumb 09/25/2018   Degenerative arthritis of left knee 05/24/2018   CAD (coronary artery disease) 05/16/2018   Vitamin D deficiency 11/11/2017   Degenerative disc disease, lumbar 08/29/2017   Obesity (BMI 30.0-34.9) 06/14/2016   Routine general medical examination at a health care facility 12/31/2014   Osteopenia 07/20/2013   Hypokalemia 06/14/2012   GERD (gastroesophageal reflux disease) 09/16/2011   Generalized anxiety disorder 09/16/2011   Constipation, slow transit 09/16/2011   Personal history of colonic polyps 08/17/2011   DJD (degenerative joint disease) of knee 06/25/2011  Pure hyperglyceridemia 06/24/2011   Hyperlipidemia LDL goal <70 07/30/2009   Hypothyroidism 04/25/2009   Essential hypertension 04/25/2009    REFERRING DIAG:  Left leg pain, Primary osteoarthritis of left knee, Primary osteoarthritis of left hip   THERAPY DIAG:  Pain in left lower leg  Difficulty in walking, not elsewhere classified  Muscle weakness (generalized)  Rationale for Evaluation and Treatment Rehabilitation  SUBJECTIVE:    SUBJECTIVE STATEMENT: Pt estimates 50% improvement in her L thigh . This AM there is no pain.   PAIN:  Are you having pain? Yes: NPRS scale: 0/10 Pain  location: L leg from the hip to her knee Pain description: Deep painful itch/ache (had difficulty describing pain) Aggravating factors: Prolonged standing or walking Relieving factors: Rest, elevation 0-8/10  PERTINENT HISTORY: Baker's cyst; CAD, HTN   PRECAUTIONS: None   WEIGHT BEARING RESTRICTIONS: No   PATIENT GOALS: To have less pain with her L leg and for it to get stronger.   OBJECTIVE: (objective measures completed at initial evaluation unless otherwise dated)   DIAGNOSTIC FINDINGS: 08/04/22  FINDINGS: Cortical margins of the left femur are intact. There is no evidence of fracture or other focal bone lesions. Mild degenerative change of the hip and knee. Soft tissues are unremarkable.   IMPRESSION: No fracture or acute findings of the left femur. Minimal left hip and knee degenerative change.   FINDINGS: The cortical margins of the tibia and fibula are intact. There is no evidence of fracture or other focal bone lesions. Mild degenerative change of the knee. Ankle alignment is maintained. Soft tissues are unremarkable.   IMPRESSION: No fracture of the left lower leg.   PATIENT SURVEYS:  FOTO NOT CAPTURED   COGNITION: Overall cognitive status: Within functional limits for tasks assessed                         SENSATION: WFL   MUSCLE LENGTH: Hamstrings: Right NT deg; Left NT deg Marcello Moores test: Right NT deg; Left NT deg   POSTURE: forward head   PALPATION: TTP to the L anterior/lateral thigh/hip and inguinal areas   LOWER EXTREMITY ROM:   Active ROM Right eval Left eval Left 10/20/22  Hip flexion   Decreased, limted by pain 87  Hip extension       Hip abduction   Decreased, limted by pain   Hip adduction         Hip internal rotation   Decreased, limted by pain   Hip external rotation   Decreased, limted by pain   Knee flexion 130d 112d,Decreased, limted by pain 110  Knee extension       Ankle dorsiflexion       Ankle plantarflexion       Ankle  inversion       Ankle eversion        (Blank rows = not tested)   LOWER EXTREMITY MMT:   MMT Right eval Left eval Lt 11/03/22  Hip flexion 4 3 pain 3+ pain  Hip extension 4 3 pain 3  Hip abduction 4 3 3+  Hip adduction       Hip internal rotation       Hip external rotation 4 3 3+  Knee flexion 5 4+ 5  Knee extension 5 4+ 5  Ankle dorsiflexion       Ankle plantarflexion       Ankle inversion       Ankle eversion        (  Blank rows = not tested)   LOWER EXTREMITY SPECIAL TESTS:  Hip special tests: Saralyn Pilar (FABER) test: positive  and FADIR : Positive   FUNCTIONAL TESTS:  5 times sit to stand: 27.4 sec c use of hands from bariatric chair 2 minute walk test: TBA when less symptomatic: 10/20/22 351 Feet   GAIT: Distance walked: 200' Assistive device utilized: None Level of assistance: Complete Independence Comments: moderate antalgic gait pattern initially after standing which decreases to minimal after walking approx 15 ft  [facility] Adult PT Treatment:                                                DATE: 11/03/22 Therapeutic Exercise: Nustep L6  UE/LE x 5  min Prone knee flexion 2x10 Prone knee flexion stretch x3 20" L Hip/knee flexion heel slides 2x10  Left QS into towel 5 sec x 10 Off set bridge x 10  SAQ x10 Red band clam 10 x 2   [facility] Adult PT Treatment:                                                DATE: 10/28/22 Therapeutic Exercise: Nustep L6  UE/LE x 5  min L Hip/knee flexion heel slides 2x10  Left QS into towel 5 sec x 10 Off set bridge x 10  SAQ x10 Red band clam 10 x 2  Manual Therapy: STM to the L rectus femoris and vastus lateralis Skilled palpation to identify TrPs and taut muscle bands Modalities: Moist heat x15 minsto the L thigh  Trigger Point Dry Needling Treatment: Pre-treatment instruction: Patient instructed on dry needling rationale, procedures, and possible side effects including pain during treatment (achy,cramping feeling), bruising, drop  of blood, lightheadedness, nausea, sweating. Patient Consent Given: Yes Education handout provided: Yes Muscles treated: L rectus femoris and vastus lateralis  Needle size and number: .30x8m x 1 Electrical stimulation performed: No Parameters: N/A Treatment response/outcome: Twitch response elicited Post-treatment instructions: Patient instructed to expect possible mild to moderate muscle soreness later today and/or tomorrow. Patient instructed in methods to reduce muscle soreness and to continue prescribed HEP. If patient was dry needled over the lung field, patient was instructed on signs and symptoms of pneumothorax and, however unlikely, to see immediate medical attention should they occur. Patient was also educated on signs and symptoms of infection and to seek medical attention should they occur. Patient verbalized understanding of these instructions and education.  .[facility] FessAdult PT Treatment:                                                DATE: 10/26/22 Therapeutic Exercise: Nustep L5  UE/LE x 5  min STS x 10, bariatric LAQ 3#  5 x 2 each Hip flexor EOM x 30 sec Left heel slide on sliding board  Left QS into towel 5 sec x 10 Off set bridge x 10  SKTC AAROM using towel, bilat Red band clam 10 x 2  Bilat SAQ 3#  Seated Assisted knee flexion, ankles crossed.  PATIENT EDUCATION:  Education details: Eval findings, POC, Self care; use of moist heat for pain management Person educated: Patient Education method: Explanation Education comprehension: verbalized understanding   HOME EXERCISE PROGRAM: Access Code: [email or web address] URL: [email or web address] Date: 10/12/2022 Prepared by: Gar Ponto  Exercises - Supine Knee Extension Strengthening  - 1 x daily - 7 x weekly - 1 sets - 10 reps - 3 hold - Supine March  - 1 x daily - 7 x weekly - 1 sets - 20 reps - 2  hold - Hooklying Clamshell with Resistance  - 1 x daily - 7 x weekly - 1 sets - 10 reps - 3 hold - Supine Bridge  - 1 x daily - 7 x weekly - 1 sets - 10 reps - 3 hold - Sit to Stand with Counter Support  - 1 x daily - 7 x weekly - 3 sets - 5 reps   ASSESSMENT:   CLINICAL IMPRESSION: Reassess strength and ROM and both have improved. The L hip is still weak and limited by pain, but improved. L knee flexion AROM has made good improvement. Pain has improved as well with it being at a lower level more often. PT was completed for L LE strengthening and ROM. Pt tolerated PT today without adverse effects. Pt will continue to benefit from skilled PT to address impairments for improved function of the L LE c less pain.  OBJECTIVE IMPAIRMENTS: decreased activity tolerance, difficulty walking, decreased ROM, decreased strength, impaired flexibility, and pain.    ACTIVITY LIMITATIONS: carrying, lifting, bending, standing, squatting, stairs, dressing, locomotion level, and caring for others   PARTICIPATION LIMITATIONS: meal prep, cleaning, laundry, shopping, community activity, and church   PERSONAL FACTORS: Age, Fitness, and Time since onset of injury/illness/exacerbation are also affecting patient's functional outcome.    GOALS:   SHORT TERM GOALS: Target date: 10/26/2022  Pt will be Ind in an initial HEP Baseline: To be initiated Status: 10/26/22 Goal status: MET    2.  Pt will voice understanding of measures to assist in pain reduction Baseline: Initiated Status: 10/26/22 Goal status: MET   LONG TERM GOALS: Target date: 11/26/22   Pt will be Ind in a final HEP to maintain achieved LOF  Baseline: To be initiated Goal status: INITIAL   2.  Increase L hip strength to 4/5 and L knee strength to 5/5 for improved functional use of the L LE with decreased pain Baseline: see flow sheet Status: 10/04/22- See flow sheet Goal status: INITIAL   3.  Increase L knee flex ROM 125d for improved  functional use of L LE with decreased pain Baseline: 112d Status: 11/03/22=125d Goal status: MET   4.  Improve 5xSTS by 10" and 2MWT by MCID of 58f as indication of improved functional mobility  Baseline: 5xSTS=27.4 sec c use of hands from bariatric chair, 2MWT=351 Ft Goal status: INITIAL   5.  Pt will report a decrease in L LE pain to 3/10 or less with daily activities for improved function and QOL Baseline: 0-8/10 Status: 0-7/10, but less often at a higher level Goal status: INITIAL   PLAN:   PT FREQUENCY: 2x/week   PT DURATION: 6 weeks   PLANNED INTERVENTIONS: Therapeutic exercises, Therapeutic activity, Balance training, Gait training, Patient/Family education, Self Care, Joint mobilization, Stair training, Aquatic Therapy, Dry Needling, Electrical stimulation, Cryotherapy, Moist heat, Taping, Vasopneumatic device, Ultrasound, Ionotophoresis 427mml Dexamethasone, Manual therapy, and Re-evaluation   PLAN FOR NEXT SESSION:  develop HEP; progress therex as indicated; use  of modalities, manual therapy; and TPDN as indicated. Assess LTGs and response of TPDN  Adetokunbo Mccadden MS, PT 11/03/22 8:50 AM

## 2022-11-03 ENCOUNTER — Ambulatory Visit: Payer: Medicare Other | Attending: Emergency Medicine

## 2022-11-03 DIAGNOSIS — R262 Difficulty in walking, not elsewhere classified: Secondary | ICD-10-CM | POA: Insufficient documentation

## 2022-11-03 DIAGNOSIS — M79662 Pain in left lower leg: Secondary | ICD-10-CM | POA: Insufficient documentation

## 2022-11-03 DIAGNOSIS — M6281 Muscle weakness (generalized): Secondary | ICD-10-CM | POA: Diagnosis not present

## 2022-11-04 ENCOUNTER — Other Ambulatory Visit: Payer: Self-pay | Admitting: Internal Medicine

## 2022-11-04 DIAGNOSIS — E039 Hypothyroidism, unspecified: Secondary | ICD-10-CM

## 2022-11-04 DIAGNOSIS — K219 Gastro-esophageal reflux disease without esophagitis: Secondary | ICD-10-CM

## 2022-11-04 NOTE — Therapy (Signed)
OUTPATIENT PHYSICAL THERAPY TREATMENT NOTE   Patient Name: [name] MRN: [id number] DOB:1951/10/18, 71 y.o., female Today's Date: 11/05/2022  PCP: [name], MD  REFERRING PROVIDER: Glennon Mac, DO   END Milas Kocher, MD  SESSION:   PT End of Session - 11/05/22 0809     Visit Number 9    Number of Visits 13    Date for PT Re-Evaluation 11/26/22    Authorization Type [employer] MEDICARE    Progress Note Due on Visit 10    PT Start Time 0804    PT Stop Time 0845    PT Time Calculation (min) 41 min    Activity Tolerance Patient tolerated treatment well    Behavior During Therapy WFL for tasks assessed/performed                 Past Medical History:  Diagnosis Date   Anemia    Arthritis    Cardiomyopathy ([place])    Cholelithiasis    Chronic back pain    Constipation    Esophageal reflux    Fibromyalgia    Hemorrhoids    Hyperlipidemia    Hypertension    Hypothyroid    IBS (irritable bowel syndrome)    Internal hemorrhoids without mention of complication    Left leg pain    Myocardial infarction [facility])    Personal history of colonic polyps 05/06/2010   ADENOMATOUS POLYP   Pneumonia    Tubular adenoma of colon    Past Surgical History:  Procedure Laterality Date   ABDOMINAL HYSTERECTOMY     CERVICAL DISC SURGERY     PLATE IN NECK & BACK   CHOLECYSTECTOMY     COLONOSCOPY  09/27/2011   NORMAL    COLONOSCOPY WITH PROPOFOL N/A 10/06/2020   Procedure: COLONOSCOPY WITH PROPOFOL;  Surgeon: Irving Copas., MD;  Location: [facility] ENDOSCOPY;  Service: Gastroenterology;  Laterality: N/A;  ultra slim scope    LEFT HEART CATH AND CORONARY ANGIOGRAPHY N/A 05/08/2018   Procedure: LEFT HEART CATH AND CORONARY ANGIOGRAPHY;  Surgeon: Belva Crome, MD;  Location: [facility];  Service: Cardiovascular;  Laterality: N/A;   LUMBAR DISC SURGERY     POLYPECTOMY  10/06/2020   Procedure: POLYPECTOMY;  Surgeon: Mansouraty, Telford Nab., MD;  Location: [facility];  Service: Gastroenterology;;   TONSILLECTOMY     Patient Active Problem List   Diagnosis Date Noted   Hypoglycemia associated with type 2 diabetes mellitus ([place]) 09/23/2022   Somnolence 09/23/2022   Flu vaccine need 09/14/2022   Sensorineural hearing loss (SNHL) of left ear with unrestricted hearing of right ear 07/21/2022   Primary osteoarthritis of left knee 12/24/2021   Onychomycosis of left great toe 12/14/2021   Type II diabetes mellitus with manifestations ([place]) 12/14/2021   [place] 05/21/2020   Allergic rhinitis 04/12/2020   Takotsubo cardiomyopathy 02/26/2019   NASH (nonalcoholic steatohepatitis) 01/22/2019   Chronic idiopathic constipation 01/12/2019   Arthritis of carpometacarpal ([place]) joint of left thumb 09/25/2018   Degenerative arthritis of left knee 05/24/2018   CAD (coronary artery disease) 05/16/2018   Vitamin D deficiency 11/11/2017   Degenerative disc disease, lumbar 08/29/2017   Obesity (BMI 30.0-34.9) 06/14/2016   Routine general medical examination at a health care facility 12/31/2014   Osteopenia 07/20/2013   Hypokalemia 06/14/2012   GERD (gastroesophageal reflux disease) 09/16/2011   Generalized anxiety disorder 09/16/2011   Constipation, slow transit 09/16/2011   Personal history of colonic polyps 08/17/2011   DJD (  degenerative joint disease) of knee 06/25/2011   Pure hyperglyceridemia 06/24/2011   Hyperlipidemia LDL goal <70 07/30/2009   Hypothyroidism 04/25/2009   Essential hypertension 04/25/2009    REFERRING DIAG:  Left leg pain, Primary osteoarthritis of left knee, Primary osteoarthritis of left hip   THERAPY DIAG:  Pain in left lower leg  Difficulty in walking, not elsewhere classified  Muscle weakness (generalized)  Rationale for Evaluation and Treatment Rehabilitation  SUBJECTIVE:    SUBJECTIVE STATEMENT: Pt reports L thigh pain continues to improve.   PAIN:  Are you having pain? Yes: NPRS scale: 0/10 Pain location: L  leg from the hip to her knee Pain description: Deep painful itch/ache (had difficulty describing pain) Aggravating factors: Prolonged standing or walking Relieving factors: Rest, elevation 0-8/10  PERTINENT HISTORY: Baker's cyst; CAD, HTN   PRECAUTIONS: None   WEIGHT BEARING RESTRICTIONS: No   PATIENT GOALS: To have less pain with her L leg and for it to get stronger.   OBJECTIVE: (objective measures completed at initial evaluation unless otherwise dated)   DIAGNOSTIC FINDINGS: 08/04/22  FINDINGS: Cortical margins of the left femur are intact. There is no evidence of fracture or other focal bone lesions. Mild degenerative change of the hip and knee. Soft tissues are unremarkable.   IMPRESSION: No fracture or acute findings of the left femur. Minimal left hip and knee degenerative change.   FINDINGS: The cortical margins of the tibia and fibula are intact. There is no evidence of fracture or other focal bone lesions. Mild degenerative change of the knee. Ankle alignment is maintained. Soft tissues are unremarkable.   IMPRESSION: No fracture of the left lower leg.   PATIENT SURVEYS:  FOTO NOT CAPTURED   COGNITION: Overall cognitive status: Within functional limits for tasks assessed                         SENSATION: WFL   MUSCLE LENGTH: Hamstrings: Right NT deg; Left NT deg Marcello Moores test: Right NT deg; Left NT deg   POSTURE: forward head   PALPATION: TTP to the L anterior/lateral thigh/hip and inguinal areas   LOWER EXTREMITY ROM:   Active ROM Right eval Left eval Left 10/20/22  Hip flexion   Decreased, limted by pain 87  Hip extension       Hip abduction   Decreased, limted by pain   Hip adduction         Hip internal rotation   Decreased, limted by pain   Hip external rotation   Decreased, limted by pain   Knee flexion 130d 112d,Decreased, limted by pain 110  Knee extension       Ankle dorsiflexion       Ankle plantarflexion       Ankle inversion        Ankle eversion        (Blank rows = not tested)   LOWER EXTREMITY MMT:   MMT Right eval Left eval Lt 11/03/22  Hip flexion 4 3 pain 3+ pain  Hip extension 4 3 pain 3  Hip abduction 4 3 3+  Hip adduction       Hip internal rotation       Hip external rotation 4 3 3+  Knee flexion 5 4+ 5  Knee extension 5 4+ 5  Ankle dorsiflexion       Ankle plantarflexion       Ankle inversion       Ankle eversion        (  Blank rows = not tested)   LOWER EXTREMITY SPECIAL TESTS:  Hip special tests: Saralyn Pilar (FABER) test: positive  and FADIR : Positive   FUNCTIONAL TESTS:  5 times sit to stand: 27.4 sec c use of hands from bariatric chair 11/04/22= 2 minute walk test: TBA when less symptomatic: 10/20/22 351 Feet   GAIT: Distance walked: 200' Assistive device utilized: None Level of assistance: Complete Independence Comments: moderate antalgic gait pattern initially after standing which decreases to minimal after walking approx 15 ft  [facility] Adult PT Treatment:                                                DATE: 11/05/22 Therapeutic Exercise: Nustep L6  UE/LE x 6 min L Hip/knee flexion heel slides x10 SAQ x10 SLR x10 LAQ x10 5xSTS SLS Lateral side step ups x10 at counter Forward step ups x10 at counter Side steps c RTB  10' x 6 Manual Therapy: STM to the L rectus femoris and vastus lateralis- pt was only able to tolerate light massage  [facility] Adult PT Treatment:                                                DATE: 11/03/22 Therapeutic Exercise: Nustep L6  UE/LE x 5  min Prone knee flexion 2x10 Prone knee flexion stretch x3 20" L Hip/knee flexion heel slides 2x10  Left QS into towel 5 sec x 10 Off set bridge x 10  SAQ x10 Red band clam 10 x 2   [facility] Adult PT Treatment:                                                DATE: 10/28/22 Therapeutic Exercise: Nustep L6  UE/LE x 5  min L Hip/knee flexion heel slides 2x10  Left QS into towel 5 sec x 10 Off set bridge x 10  SAQ  x10 Red band clam 10 x 2  Manual Therapy: STM to the L rectus femoris and vastus lateralis Skilled palpation to identify TrPs and taut muscle bands Modalities: Moist heat x15 minsto the L thigh  Trigger Point Dry Needling Treatment: Pre-treatment instruction: Patient instructed on dry needling rationale, procedures, and possible side effects including pain during treatment (achy,cramping feeling), bruising, drop of blood, lightheadedness, nausea, sweating. Patient Consent Given: Yes Education handout provided: Yes Muscles treated: L rectus femoris and vastus lateralis  Needle size and number: .30x75m x 1 Electrical stimulation performed: No Parameters: N/A Treatment response/outcome: Twitch response elicited Post-treatment instructions: Patient instructed to expect possible mild to moderate muscle soreness later today and/or tomorrow. Patient instructed in methods to reduce muscle soreness and to continue prescribed HEP. If patient was dry needled over the lung field, patient was instructed on signs and symptoms of pneumothorax and, however unlikely, to see immediate medical attention should they occur. Patient was also educated on signs and symptoms of infection and to seek medical attention should they occur. Patient verbalized understanding of these instructions and education.  .[facility]ult PT Treatment:  DATE: 10/26/22 Therapeutic Exercise: Nustep L5  UE/LE x 5  min STS x 10, bariatric LAQ 3#  5 x 2 each Hip flexor EOM x 30 sec Left heel slide on sliding board  Left QS into towel 5 sec x 10 Off set bridge x 10  SKTC AAROM using towel, bilat Red band clam 10 x 2  Bilat SAQ 3#  Seated Assisted knee flexion, ankles crossed.                                                                                                                    PATIENT EDUCATION:  Education details: Eval findings, POC, Self care; use of moist heat for pain  management Person educated: Patient Education method: Explanation Education comprehension: verbalized understanding   HOME EXERCISE PROGRAM: Access Code: [email or web address] URL: [email or web address] Date: 10/12/2022 Prepared by: Gar Ponto  Exercises - Supine Knee Extension Strengthening  - 1 x daily - 7 x weekly - 1 sets - 10 reps - 3 hold - Supine March  - 1 x daily - 7 x weekly - 1 sets - 20 reps - 2 hold - Hooklying Clamshell with Resistance  - 1 x daily - 7 x weekly - 1 sets - 10 reps - 3 hold - Supine Bridge  - 1 x daily - 7 x weekly - 1 sets - 10 reps - 3 hold - Sit to Stand with Counter Support  - 1 x daily - 7 x weekly - 3 sets - 5 reps   ASSESSMENT:   CLINICAL IMPRESSION: PT was completed for STM to the L thigh f/b L quad/LE strengthening. Pt continues to be very tender to palaption of her L quad. Pt demonstrates improved ability to complete a SLR and LAQ and is tolerating gradual progressions with the physical demand of her therex. Functional test for the 5xSTS has improved. Pt continues to imprve with pain, strength and function, albeit slowly.  OBJECTIVE IMPAIRMENTS: decreased activity tolerance, difficulty walking, decreased ROM, decreased strength, impaired flexibility, and pain.    ACTIVITY LIMITATIONS: carrying, lifting, bending, standing, squatting, stairs, dressing, locomotion level, and caring for others   PARTICIPATION LIMITATIONS: meal prep, cleaning, laundry, shopping, community activity, and church   PERSONAL FACTORS: Age, Fitness, and Time since onset of injury/illness/exacerbation are also affecting patient's functional outcome.    GOALS:   SHORT TERM GOALS: Target date: 10/26/2022  Pt will be Ind in an initial HEP Baseline: To be initiated Status: 10/26/22 Goal status: MET    2.  Pt will voice understanding of measures to assist in pain reduction Baseline: Initiated Status: 10/26/22 Goal status: MET   LONG TERM GOALS: Target date:  11/26/22   Pt will be Ind in a final HEP to maintain achieved LOF  Baseline: To be initiated Goal status: INITIAL   2.  Increase L hip strength to 4/5 and L knee strength to 5/5 for improved functional use of the L LE with decreased pain Baseline: see flow sheet Status: 10/04/22- See flow sheet  Goal status: Improving   3.  Increase L knee flex ROM 125d for improved functional use of L LE with decreased pain Baseline: 112d Status: 11/03/22=125d Goal status: MET   4.  Improve 5xSTS by 10" and 2MWT by MCID of 29f as indication of improved functional mobility  Baseline: 5xSTS=27.4 sec c use of hands from bariatric chair, 2MWT=351 Ft Status: 5xSTS=20.0" Goal status: Improving for 5xSTS   5.  Pt will report a decrease in L LE pain to 3/10 or less with daily activities for improved function and QOL Baseline: 0-8/10 Status: 11/03/22=0-7/10, but less often at a higher level Goal status: Improving   PLAN:   PT FREQUENCY: 2x/week   PT DURATION: 6 weeks   PLANNED INTERVENTIONS: Therapeutic exercises, Therapeutic activity, Balance training, Gait training, Patient/Family education, Self Care, Joint mobilization, Stair training, Aquatic Therapy, Dry Needling, Electrical stimulation, Cryotherapy, Moist heat, Taping, Vasopneumatic device, Ultrasound, Ionotophoresis 459mml Dexamethasone, Manual therapy, and Re-evaluation   PLAN FOR NEXT SESSION:  develop HEP; progress therex as indicated; use of modalities, manual therapy; and TPDN as indicated.   Hasaan Radde MS, PT 11/05/22 9:59 AM

## 2022-11-05 ENCOUNTER — Ambulatory Visit: Payer: Medicare Other

## 2022-11-05 DIAGNOSIS — M6281 Muscle weakness (generalized): Secondary | ICD-10-CM | POA: Diagnosis not present

## 2022-11-05 DIAGNOSIS — R262 Difficulty in walking, not elsewhere classified: Secondary | ICD-10-CM

## 2022-11-05 DIAGNOSIS — M79662 Pain in left lower leg: Secondary | ICD-10-CM | POA: Diagnosis not present

## 2022-11-09 ENCOUNTER — Ambulatory Visit: Payer: Medicare Other | Admitting: Physical Therapy

## 2022-11-09 DIAGNOSIS — R262 Difficulty in walking, not elsewhere classified: Secondary | ICD-10-CM | POA: Diagnosis not present

## 2022-11-09 DIAGNOSIS — M79662 Pain in left lower leg: Secondary | ICD-10-CM

## 2022-11-09 DIAGNOSIS — M6281 Muscle weakness (generalized): Secondary | ICD-10-CM

## 2022-11-09 NOTE — Therapy (Signed)
OUTPATIENT PHYSICAL THERAPY TREATMENT NOTE/Progress Note   Patient Name: [name] MRN: [id number] DOB:[birth year], 71 y.o., female Today's Date: 11/09/2022  PCP: [name], MD  REFERRING PROVIDER: Glennon Mac, DO   Progress Note Reporting Period 10/05/22 to 11/09/22  See note below for Objective Data and Assessment of Progress/Goals.      END Milas Kocher, MD  SESSION:   PT End of Session - 11/09/22 0723     Visit Number 10    Number of Visits 13    Date for PT Re-Evaluation 11/26/22    Authorization Type [employer] MEDICARE    PT Start Time 0720    PT Stop Time 0800    PT Time Calculation (min) 40 min                 Past Medical History:  Diagnosis Date   Anemia    Arthritis    Cardiomyopathy ([place])    Cholelithiasis    Chronic back pain    Constipation    Esophageal reflux    Fibromyalgia    Hemorrhoids    Hyperlipidemia    Hypertension    Hypothyroid    IBS (irritable bowel syndrome)    Internal hemorrhoids without mention of complication    Left leg pain    Myocardial infarction [facility] [place])    Personal history of colonic polyps 05/06/2010   ADENOMATOUS POLYP   Pneumonia    Tubular adenoma of colon    Past Surgical History:  Procedure Laterality Date   ABDOMINAL HYSTERECTOMY     CERVICAL DISC SURGERY     PLATE IN NECK & BACK   CHOLECYSTECTOMY     COLONOSCOPY  09/27/2011   NORMAL    COLONOSCOPY WITH PROPOFOL N/A 10/06/2020   Procedure: COLONOSCOPY WITH PROPOFOL;  Surgeon: Irving Copas., MD;  Location: [facility] ENDOSCOPY;  Service: Gastroenterology;  Laterality: N/A;  ultra slim scope    LEFT HEART CATH AND CORONARY ANGIOGRAPHY N/A 05/08/2018   Procedure: LEFT HEART CATH AND CORONARY ANGIOGRAPHY;  Surgeon: Belva Crome, MD;  Location: [place] [facility] LAB;  Service: Cardiovascular;  Laterality: N/A;   LUMBAR DISC SURGERY     POLYPECTOMY  10/06/2020   Procedure: POLYPECTOMY;  Surgeon: Mansouraty, Telford Nab., MD;  Location: [place];  Service: Gastroenterology;;   TONSILLECTOMY     Patient Active Problem List   Diagnosis Date Noted   Hypoglycemia associated with type 2 diabetes mellitus ([place]) 09/23/2022   Somnolence 09/23/2022   Flu vaccine need 09/14/2022   Sensorineural hearing loss (SNHL) of left ear with unrestricted hearing of right ear 07/21/2022   Primary osteoarthritis of left knee 12/24/2021   Onychomycosis of left great toe 12/14/2021   Type II diabetes mellitus with manifestations ([place]) 12/14/2021   [place] 05/21/2020   Allergic rhinitis 04/12/2020   Takotsubo cardiomyopathy 02/26/2019   NASH (nonalcoholic steatohepatitis) 01/22/2019   Chronic idiopathic constipation 01/12/2019   Arthritis of carpometacarpal ([place]) joint of left thumb 09/25/2018   Degenerative arthritis of left knee 05/24/2018   CAD (coronary artery disease) 05/16/2018   Vitamin D deficiency 11/11/2017   Degenerative disc disease, lumbar 08/29/2017   Obesity (BMI 30.0-34.9) 06/14/2016   Routine general medical examination at a health care facility 12/31/2014   Osteopenia 07/20/2013   Hypokalemia 06/14/2012   GERD (gastroesophageal reflux disease) 09/16/2011   Generalized anxiety disorder 09/16/2011   Constipation, slow transit 09/16/2011   Personal history of colonic polyps 08/17/2011   DJD (degenerative joint disease) of  knee 06/25/2011   Pure hyperglyceridemia 06/24/2011   Hyperlipidemia LDL goal <70 07/30/2009   Hypothyroidism 04/25/2009   Essential hypertension 04/25/2009    REFERRING DIAG:  Left leg pain, Primary osteoarthritis of left knee, Primary osteoarthritis of left hip   THERAPY DIAG:  Pain in left lower leg  Difficulty in walking, not elsewhere classified  Muscle weakness (generalized)  Rationale for Evaluation and Treatment Rehabilitation  SUBJECTIVE:    SUBJECTIVE STATEMENT: Pt reports L thigh pain continues to improve. No pain on arrival. Have pain in thigh with walking and it wakes me up at  night sometimes.    PAIN:  Are you having pain? Yes: NPRS scale: 0/10 Pain location: L leg from the hip to her knee Pain description: Deep painful itch/ache (had difficulty describing pain) Aggravating factors: Prolonged standing or walking Relieving factors: Rest, elevation 0-7/10 pain range  PERTINENT HISTORY: Baker's cyst; CAD, HTN   PRECAUTIONS: None   WEIGHT BEARING RESTRICTIONS: No   PATIENT GOALS: To have less pain with her L leg and for it to get stronger.   OBJECTIVE: (objective measures completed at initial evaluation unless otherwise dated)   DIAGNOSTIC FINDINGS: 08/04/22  FINDINGS: Cortical margins of the left femur are intact. There is no evidence of fracture or other focal bone lesions. Mild degenerative change of the hip and knee. Soft tissues are unremarkable.   IMPRESSION: No fracture or acute findings of the left femur. Minimal left hip and knee degenerative change.   FINDINGS: The cortical margins of the tibia and fibula are intact. There is no evidence of fracture or other focal bone lesions. Mild degenerative change of the knee. Ankle alignment is maintained. Soft tissues are unremarkable.   IMPRESSION: No fracture of the left lower leg.   PATIENT SURVEYS:  FOTO NOT CAPTURED   COGNITION: Overall cognitive status: Within functional limits for tasks assessed                         SENSATION: WFL   MUSCLE LENGTH: Hamstrings: Right NT deg; Left NT deg Marcello Moores test: Right NT deg; Left NT deg   POSTURE: forward head   PALPATION: TTP to the L anterior/lateral thigh/hip and inguinal areas   LOWER EXTREMITY ROM:   Active ROM Right eval Left eval Left 10/20/22 Left 11/09/22  Hip flexion   Decreased, limted by pain 87 98 AROM 101 PROM  Hip extension        Hip abduction   Decreased, limted by pain    Hip adduction          Hip internal rotation   Decreased, limted by pain    Hip external rotation   Decreased, limted by pain    Knee  flexion 130d 112d,Decreased, limted by pain 110 125  Knee extension        Ankle dorsiflexion        Ankle plantarflexion        Ankle inversion        Ankle eversion         (Blank rows = not tested)   LOWER EXTREMITY MMT:   MMT Right eval Left eval Lt 11/03/22  Hip flexion 4 3 pain 3+ pain  Hip extension 4 3 pain 3  Hip abduction 4 3 3+  Hip adduction       Hip internal rotation       Hip external rotation 4 3 3+  Knee flexion 5 4+ 5  Knee extension  5 4+ 5  Ankle dorsiflexion       Ankle plantarflexion       Ankle inversion       Ankle eversion        (Blank rows = not tested)   LOWER EXTREMITY SPECIAL TESTS:  Hip special tests: Saralyn Pilar (FABER) test: positive  and FADIR : Positive   FUNCTIONAL TESTS:  5 times sit to stand: 27.4 sec c use of hands from bariatric chair 11/03/22= 20.0 S 2 minute walk test: TBA when less symptomatic: 10/20/22 351 Feet: 11/09/22: 405 Feet   GAIT: Distance walked: 200' Assistive device utilized: None Level of assistance: Complete Independence Comments: moderate antalgic gait pattern initially after standing which decreases to minimal after walking approx 15 ft  [facility] Adult PT Treatment:                                                DATE: 11/09/22 Therapeutic Exercise: Nustep L 5 UE/LE x 5 minutes  Standing hip abduction x 10 each Standing hip extension x 10 each  Standing hip flexion alternating x 10 each  STS x 5 SLR 5 x 2 left  Bridge x 10  Updated HEP   Therapeutic Activity: 2 MWT: 404 feet  5 x STS 20 Sec    [facility] Adult PT Treatment:                                                DATE: 11/05/22 Therapeutic Exercise: Nustep L6  UE/LE x 6 min L Hip/knee flexion heel slides x10 SAQ x10 SLR x10 LAQ x10 5xSTS SLS Lateral side step ups x10 at counter Forward step ups x10 at counter Side steps c RTB  10' x 6 Manual Therapy: STM to the L rectus femoris and vastus lateralis- pt was only able to tolerate light massage  [facility]  Adult PT Treatment:                                                DATE: 11/03/22 Therapeutic Exercise: Nustep L6  UE/LE x 5  min Prone knee flexion 2x10 Prone knee flexion stretch x3 20" L Hip/knee flexion heel slides 2x10  Left QS into towel 5 sec x 10 Off set bridge x 10  SAQ x10 Red band clam 10 x 2   [facility] Adult PT Treatment:                                                DATE: 10/28/22 Therapeutic Exercise: Nustep L6  UE/LE x 5  min L Hip/knee flexion heel slides 2x10  Left QS into towel 5 sec x 10 Off set bridge x 10  SAQ x10 Red band clam 10 x 2  Manual Therapy: STM to the L rectus femoris and vastus lateralis Skilled palpation to identify TrPs and taut muscle bands Modalities: Moist heat x15 minsto the L thigh  Trigger Point Dry Needling Treatment: Pre-treatment instruction: Patient instructed on  dry needling rationale, procedures, and possible side effects including pain during treatment (achy,cramping feeling), bruising, drop of blood, lightheadedness, nausea, sweating. Patient Consent Given: Yes Education handout provided: Yes Muscles treated: L rectus femoris and vastus lateralis  Needle size and number: .30x59m x 1 Electrical stimulation performed: No Parameters: N/A Treatment response/outcome: Twitch response elicited Post-treatment instructions: Patient instructed to expect possible mild to moderate muscle soreness later today and/or tomorrow. Patient instructed in methods to reduce muscle soreness and to continue prescribed HEP. If patient was dry needled over the lung field, patient was instructed on signs and symptoms of pneumothorax and, however unlikely, to see immediate medical attention should they occur. Patient was also educated on signs and symptoms of infection and to seek medical attention should they occur. Patient verbalized understanding of these instructions and education.  .[facility]essAdult PT Treatment:                                                 DATE: 10/26/22 Therapeutic Exercise: Nustep L5  UE/LE x 5  min STS x 10, bariatric LAQ 3#  5 x 2 each Hip flexor EOM x 30 sec Left heel slide on sliding board  Left QS into towel 5 sec x 10 Off set bridge x 10  SKTC AAROM using towel, bilat Red band clam 10 x 2  Bilat SAQ 3#  Seated Assisted knee flexion, ankles crossed.                                                                                                                    PATIENT EDUCATION:  Education details: Eval findings, POC, Self care; use of moist heat for pain management Person educated: Patient Education method: Explanation Education comprehension: verbalized understanding   HOME EXERCISE PROGRAM: Access Code: [email or web address] URL: [email or web address] Date: 10/12/2022 Prepared by: AGar Ponto Exercises - Supine Knee Extension Strengthening  - 1 x daily - 7 x weekly - 1 sets - 10 reps - 3 hold - Supine March  - 1 x daily - 7 x weekly - 1 sets - 20 reps - 2 hold - Hooklying Clamshell with Resistance  - 1 x daily - 7 x weekly - 1 sets - 10 reps - 3 hold - Supine Bridge  - 1 x daily - 7 x weekly - 1 sets - 10 reps - 3 hold - Sit to Stand with Counter Support  - 1 x daily - 7 x weekly - 3 sets - 5 reps Added 11/09/22 - Active straight leg raise  - 1 x daily - 7 x weekly - 2 sets - 5 reps - Modified Thomas Stretch  - 1 x daily - 7 x weekly - 1 sets - 3 reps - 30 hold - Standing Hip Abduction with Counter Support  - 1 x daily - 7 x weekly - 2 sets -  10 reps - Standing March with Counter Support  - 1 x daily - 7 x weekly - 2 sets - 10 reps   ASSESSMENT:   CLINICAL IMPRESSION: Pt reports improvement. Her 2 MWT has improved. Her hip and Knee AROM have also improved. Able to progress strengthening HEP this visit as she is tolerating more advanced strengthening in clinic. She does continue to have pain with ambulation and she has intermittent night time pain. Her pain levels are in a lower range compared to  when she started PT. She has met LTG #3 and is progressing toward her remaining LTGs.   OBJECTIVE IMPAIRMENTS: decreased activity tolerance, difficulty walking, decreased ROM, decreased strength, impaired flexibility, and pain.    ACTIVITY LIMITATIONS: carrying, lifting, bending, standing, squatting, stairs, dressing, locomotion level, and caring for others   PARTICIPATION LIMITATIONS: meal prep, cleaning, laundry, shopping, community activity, and church   PERSONAL FACTORS: Age, Fitness, and Time since onset of injury/illness/exacerbation are also affecting patient's functional outcome.    GOALS:   SHORT TERM GOALS: Target date: 10/26/2022  Pt will be Ind in an initial HEP Baseline: To be initiated Status: 10/26/22 Goal status: MET    2.  Pt will voice understanding of measures to assist in pain reduction Baseline: Initiated Status: 10/26/22 Goal status: MET   LONG TERM GOALS: Target date: 11/26/22   Pt will be Ind in a final HEP to maintain achieved LOF  Baseline: To be initiated Goal status: ONGOING   2.  Increase L hip strength to 4/5 and L knee strength to 5/5 for improved functional use of the L LE with decreased pain Baseline: see flow sheet Status: 10/04/22- See flow sheet Status: 11/09/22: see objective chart above Goal status: Improving   3.  Increase L knee flex ROM 125d for improved functional use of L LE with decreased pain Baseline: 112d Status: 11/03/22=125 d Goal status: MET   4.  Improve 5xSTS by 10" and 2MWT by MCID of 14f as indication of improved functional mobility  Baseline: 5xSTS=27.4 sec c use of hands from bariatric chair, 2MWT=351 Ft Status: 11/03/22: 5xSTS=20.0" Status: 11/09/22: 2 MWT 405 feet; 20 sec 5x STS Goal status: Improving    5.  Pt will report a decrease in L LE pain to 3/10 or less with daily activities for improved function and QOL Baseline: 0-8/10 Status: 11/03/22=0-7/10, but less often at a higher level Goal status: Improving    PLAN:   PT FREQUENCY: 2x/week   PT DURATION: 6 weeks   PLANNED INTERVENTIONS: Therapeutic exercises, Therapeutic activity, Balance training, Gait training, Patient/Family education, Self Care, Joint mobilization, Stair training, Aquatic Therapy, Dry Needling, Electrical stimulation, Cryotherapy, Moist heat, Taping, Vasopneumatic device, Ultrasound, Ionotophoresis 473mml Dexamethasone, Manual therapy, and Re-evaluation   PLAN FOR NEXT SESSION:  develop HEP; progress therex as indicated; use of modalities, manual therapy; and TPDN as indicated.   JeHessie DienerPTA 11/09/22 8:49 AM Phone: [phone]: [id number], PT 11/11/22 6:00 AM

## 2022-11-11 ENCOUNTER — Ambulatory Visit: Payer: Medicare Other

## 2022-11-11 DIAGNOSIS — R262 Difficulty in walking, not elsewhere classified: Secondary | ICD-10-CM

## 2022-11-11 DIAGNOSIS — M6281 Muscle weakness (generalized): Secondary | ICD-10-CM | POA: Diagnosis not present

## 2022-11-11 DIAGNOSIS — M79662 Pain in left lower leg: Secondary | ICD-10-CM | POA: Diagnosis not present

## 2022-11-11 NOTE — Therapy (Signed)
OUTPATIENT PHYSICAL THERAPY TREATMENT NOTE   Patient Name: [name] MRN: [id number] DOB:[birth year], 71 y.o., female Today's Date: 11/11/2022  PCP: [name], MD  REFERRING PROVIDER: Glennon Mac, DO  See note below for Objective Data and Assessment of Progress/Goals.      END Milas Kocher, MD  SESSION:   PT End of Session - 11/11/22 0730     Visit Number 11    Number of Visits 13    Date for PT Re-Evaluation 11/26/22    Authorization Type [employer] MEDICARE    Progress Note Due on Visit 20    PT Start Time 0717    PT Stop Time 0800    PT Time Calculation (min) 43 min    Activity Tolerance Patient tolerated treatment well    Behavior During Therapy WFL for tasks assessed/performed                 Past Medical History:  Diagnosis Date   Anemia    Arthritis    Cardiomyopathy ([place])    Cholelithiasis    Chronic back pain    Constipation    Esophageal reflux    Fibromyalgia    Hemorrhoids    Hyperlipidemia    Hypertension    Hypothyroid    IBS (irritable bowel syndrome)    Internal hemorrhoids without mention of complication    Left leg pain    Myocardial infarction [facility])    Personal history of colonic polyps 05/06/2010   ADENOMATOUS POLYP   Pneumonia    Tubular adenoma of colon    Past Surgical History:  Procedure Laterality Date   ABDOMINAL HYSTERECTOMY     CERVICAL DISC SURGERY     PLATE IN NECK & BACK   CHOLECYSTECTOMY     COLONOSCOPY  09/27/2011   NORMAL    COLONOSCOPY WITH PROPOFOL N/A 10/06/2020   Procedure: COLONOSCOPY WITH PROPOFOL;  Surgeon: Irving Copas., MD;  Location: [facility] ENDOSCOPY;  Service: Gastroenterology;  Laterality: N/A;  ultra slim scope    LEFT HEART CATH AND CORONARY ANGIOGRAPHY N/A 05/08/2018   Procedure: LEFT HEART CATH AND CORONARY ANGIOGRAPHY;  Surgeon: Belva Crome, MD;  Location: [place] [facility];  Service: Cardiovascular;  Laterality: N/A;   LUMBAR DISC SURGERY     POLYPECTOMY  10/06/2020    Procedure: POLYPECTOMY;  Surgeon: Mansouraty, Telford Nab., MD;  Location: [place];  Service: Gastroenterology;;   TONSILLECTOMY     Patient Active Problem List   Diagnosis Date Noted   Hypoglycemia associated with type 2 diabetes mellitus ([place]) 09/23/2022   Somnolence 09/23/2022   Flu vaccine need 09/14/2022   Sensorineural hearing loss (SNHL) of left ear with unrestricted hearing of right ear 07/21/2022   Primary osteoarthritis of left knee 12/24/2021   Onychomycosis of left great toe 12/14/2021   Type II diabetes mellitus with manifestations ([place]) 12/14/2021   [place] 05/21/2020   Allergic rhinitis 04/12/2020   Takotsubo cardiomyopathy 02/26/2019   NASH (nonalcoholic steatohepatitis) 01/22/2019   Chronic idiopathic constipation 01/12/2019   Arthritis of carpometacarpal ([place]) joint of left thumb 09/25/2018   Degenerative arthritis of left knee 05/24/2018   CAD (coronary artery disease) 05/16/2018   Vitamin D deficiency 11/11/2017   Degenerative disc disease, lumbar 08/29/2017   Obesity (BMI 30.0-34.9) 06/14/2016   Routine general medical examination at a health care facility 12/31/2014   Osteopenia 07/20/2013   Hypokalemia 06/14/2012   GERD (gastroesophageal reflux disease) 09/16/2011   Generalized anxiety disorder 09/16/2011   Constipation,  slow transit 09/16/2011   Personal history of colonic polyps 08/17/2011   DJD (degenerative joint disease) of knee 06/25/2011   Pure hyperglyceridemia 06/24/2011   Hyperlipidemia LDL goal <70 07/30/2009   Hypothyroidism 04/25/2009   Essential hypertension 04/25/2009    REFERRING DIAG:  Left leg pain, Primary osteoarthritis of left knee, Primary osteoarthritis of left hip   THERAPY DIAG:  Pain in left lower leg  Difficulty in walking, not elsewhere classified  Muscle weakness (generalized)  Rationale for Evaluation and Treatment Rehabilitation  SUBJECTIVE:    SUBJECTIVE STATEMENT: Pt reports no L thigh pain on arrival.    PAIN:  Are you having pain? Yes: NPRS scale: 0/10 Pain location: L leg from the hip to her knee Pain description: Deep painful itch/ache (had difficulty describing pain) Aggravating factors: Prolonged standing or walking Relieving factors: Rest, elevation 0-7/10 pain range  PERTINENT HISTORY: Baker's cyst; CAD, HTN   PRECAUTIONS: None   WEIGHT BEARING RESTRICTIONS: No   PATIENT GOALS: To have less pain with her L leg and for it to get stronger.   OBJECTIVE: (objective measures completed at initial evaluation unless otherwise dated)   DIAGNOSTIC FINDINGS: 08/04/22  FINDINGS: Cortical margins of the left femur are intact. There is no evidence of fracture or other focal bone lesions. Mild degenerative change of the hip and knee. Soft tissues are unremarkable.   IMPRESSION: No fracture or acute findings of the left femur. Minimal left hip and knee degenerative change.   FINDINGS: The cortical margins of the tibia and fibula are intact. There is no evidence of fracture or other focal bone lesions. Mild degenerative change of the knee. Ankle alignment is maintained. Soft tissues are unremarkable.   IMPRESSION: No fracture of the left lower leg.   PATIENT SURVEYS:  FOTO NOT CAPTURED   COGNITION: Overall cognitive status: Within functional limits for tasks assessed                         SENSATION: WFL   MUSCLE LENGTH: Hamstrings: Right NT deg; Left NT deg Marcello Moores test: Right NT deg; Left NT deg   POSTURE: forward head   PALPATION: TTP to the L anterior/lateral thigh/hip and inguinal areas   LOWER EXTREMITY ROM:   Active ROM Right eval Left eval Left 10/20/22 Left 11/09/22  Hip flexion   Decreased, limted by pain 87 98 AROM 101 PROM  Hip extension        Hip abduction   Decreased, limted by pain    Hip adduction          Hip internal rotation   Decreased, limted by pain    Hip external rotation   Decreased, limted by pain    Knee flexion 130d  112d,Decreased, limted by pain 110 125  Knee extension        Ankle dorsiflexion        Ankle plantarflexion        Ankle inversion        Ankle eversion         (Blank rows = not tested)   LOWER EXTREMITY MMT:   MMT Right eval Left eval Lt 11/03/22  Hip flexion 4 3 pain 3+ pain  Hip extension 4 3 pain 3  Hip abduction 4 3 3+  Hip adduction       Hip internal rotation       Hip external rotation 4 3 3+  Knee flexion 5 4+ 5  Knee extension 5  4+ 5  Ankle dorsiflexion       Ankle plantarflexion       Ankle inversion       Ankle eversion        (Blank rows = not tested)   LOWER EXTREMITY SPECIAL TESTS:  Hip special tests: Saralyn Pilar (FABER) test: positive  and FADIR : Positive   FUNCTIONAL TESTS:  5 times sit to stand: 27.4 sec c use of hands from bariatric chair 11/03/22= 20.0 S 2 minute walk test: TBA when less symptomatic: 10/20/22 351 Feet: 11/09/22: 405 Feet   GAIT: Distance walked: 200' Assistive device utilized: None Level of assistance: Complete Independence Comments: moderate antalgic gait pattern initially after standing which decreases to minimal after walking approx 15 ft  [facility] Adult PT Treatment:                                                DATE: 11/11/22 Therapeutic Exercise: Nustep L 5 UE/LE x 5 minutes  L LAQ 3x10 2# Bridging 2x10, partial Sipine L clam GTB 2x10 Standing L nee lifts 2x10, 2# Standing hip abduction 2 x 10 each, 2# L Standing hip extension 2 x 10 each, 2# L  [facility] Adult PT Treatment:                                                DATE: 11/09/22 Therapeutic Exercise: Nustep L 5 UE/LE x 5 minutes  Standing hip abduction x 10 each Standing hip extension x 10 each  Standing hip flexion alternating x 10 each  STS x 5 SLR 5 x 2 left  Bridge x 10  Updated HEP   Therapeutic Activity: 2 MWT: 404 feet  5 x STS 20 Sec    [facility] Adult PT Treatment:                                                DATE: 11/05/22 Therapeutic Exercise: Nustep L6   UE/LE x 6 min L Hip/knee flexion heel slides x10 SAQ x10 SLR x10 LAQ x10 5xSTS SLS Lateral side step ups x10 at counter Forward step ups x10 at counter Side steps c RTB  10' x 6 Manual Therapy: STM to the L rectus femoris and vastus lateralis- pt was only able to tolerate light massage   Trigger Point Dry Needling Treatment: Pre-treatment instruction: Patient instructed on dry needling rationale, procedures, and possible side effects including pain during treatment (achy,cramping feeling), bruising, drop of blood, lightheadedness, nausea, sweating. Patient Consent Given: Yes Education handout provided: Yes Muscles treated: L rectus femoris and vastus lateralis  Needle size and number: .30x38m x 1 Electrical stimulation performed: No Parameters: N/A Treatment response/outcome: Twitch response elicited Post-treatment instructions: Patient instructed to expect possible mild to moderate muscle soreness later today and/or tomorrow. Patient instructed in methods to reduce muscle soreness and to continue prescribed HEP. If patient was dry needled over the lung field, patient was instructed on signs and symptoms of pneumothorax and, however unlikely, to see immediate medical attention should they occur. Patient was also educated on signs and symptoms of infection and to  seek medical attention should they occur. Patient verbalized understanding of these instructions and education.                                                                                                                   PATIENT EDUCATION:  Education details: Eval findings, POC, Self care; use of moist heat for pain management Person educated: Patient Education method: Explanation Education comprehension: verbalized understanding   HOME EXERCISE PROGRAM: Access Code: [email or web address] URL: [email or web address] Date: 10/12/2022 Prepared by: Gar Ponto  Exercises - Supine Knee Extension Strengthening  - 1 x  daily - 7 x weekly - 1 sets - 10 reps - 3 hold - Supine March  - 1 x daily - 7 x weekly - 1 sets - 20 reps - 2 hold - Hooklying Clamshell with Resistance  - 1 x daily - 7 x weekly - 1 sets - 10 reps - 3 hold - Supine Bridge  - 1 x daily - 7 x weekly - 1 sets - 10 reps - 3 hold - Sit to Stand with Counter Support  - 1 x daily - 7 x weekly - 3 sets - 5 reps Added 11/09/22 - Active straight leg raise  - 1 x daily - 7 x weekly - 2 sets - 5 reps - Modified Thomas Stretch  - 1 x daily - 7 x weekly - 1 sets - 3 reps - 30 hold - Standing Hip Abduction with Counter Support  - 1 x daily - 7 x weekly - 2 sets - 10 reps - Standing March with Counter Support  - 1 x daily - 7 x weekly - 2 sets - 10 reps   ASSESSMENT:   CLINICAL IMPRESSION: PT was completed for LE strengthening with progressive demand with increased resistance and therex in the CKC. Pt tolerated PT today without adverse effects. Pt continues to make progress re: pain, strength, and function. Pt will continue to benefit from skilled PT to address impairments for improved L LE function with less pain.  OBJECTIVE IMPAIRMENTS: decreased activity tolerance, difficulty walking, decreased ROM, decreased strength, impaired flexibility, and pain.    ACTIVITY LIMITATIONS: carrying, lifting, bending, standing, squatting, stairs, dressing, locomotion level, and caring for others   PARTICIPATION LIMITATIONS: meal prep, cleaning, laundry, shopping, community activity, and church   PERSONAL FACTORS: Age, Fitness, and Time since onset of injury/illness/exacerbation are also affecting patient's functional outcome.    GOALS:   SHORT TERM GOALS: Target date: 10/26/2022  Pt will be Ind in an initial HEP Baseline: To be initiated Status: 10/26/22 Goal status: MET    2.  Pt will voice understanding of measures to assist in pain reduction Baseline: Initiated Status: 10/26/22 Goal status: MET   LONG TERM GOALS: Target date: 11/26/22   Pt will be  Ind in a final HEP to maintain achieved LOF  Baseline: To be initiated Goal status: ONGOING   2.  Increase L hip strength to 4/5 and L knee strength to 5/5 for improved functional use  of the L LE with decreased pain Baseline: see flow sheet Status: 10/04/22- See flow sheet Status: 11/09/22: see objective chart above Goal status: Improving   3.  Increase L knee flex ROM 125d for improved functional use of L LE with decreased pain Baseline: 112d Status: 11/03/22=125 d Goal status: MET   4.  Improve 5xSTS by 10" and 2MWT by MCID of 75f as indication of improved functional mobility  Baseline: 5xSTS=27.4 sec c use of hands from bariatric chair, 2MWT=351 Ft Status: 11/03/22: 5xSTS=20.0" Status: 11/09/22: 2 MWT 405 feet; 20 sec 5x STS Goal status: Improving    5.  Pt will report a decrease in L LE pain to 3/10 or less with daily activities for improved function and QOL Baseline: 0-8/10 Status: 11/03/22=0-7/10, but less often at a higher level Goal status: Improving   PLAN:   PT FREQUENCY: 2x/week   PT DURATION: 6 weeks   PLANNED INTERVENTIONS: Therapeutic exercises, Therapeutic activity, Balance training, Gait training, Patient/Family education, Self Care, Joint mobilization, Stair training, Aquatic Therapy, Dry Needling, Electrical stimulation, Cryotherapy, Moist heat, Taping, Vasopneumatic device, Ultrasound, Ionotophoresis 471mml Dexamethasone, Manual therapy, and Re-evaluation   PLAN FOR NEXT SESSION:  develop HEP; progress therex as indicated; use of modalities, manual therapy; and TPDN as indicated.   Jasmeet Manton MS, PT 11/11/22 8:02 AM

## 2022-11-16 ENCOUNTER — Ambulatory Visit: Payer: Medicare Other

## 2022-11-16 DIAGNOSIS — M79662 Pain in left lower leg: Secondary | ICD-10-CM

## 2022-11-16 DIAGNOSIS — R262 Difficulty in walking, not elsewhere classified: Secondary | ICD-10-CM

## 2022-11-16 DIAGNOSIS — M6281 Muscle weakness (generalized): Secondary | ICD-10-CM | POA: Diagnosis not present

## 2022-11-16 NOTE — Therapy (Signed)
OUTPATIENT PHYSICAL THERAPY TREATMENT NOTE   Patient Name: [name] MRN: [id number] DOB:Apr 04, 1951, 71 y.o., female Today's Date: 11/16/2022  PCP: [name], MD  REFERRING PROVIDER: Glennon Mac, DO  See note below for Objective Data and Assessment of Progress/Goals.      END Milas Kocher, MD  SESSION:   PT End of Session - 11/16/22 0818     Visit Number 12    Number of Visits 13    Date for PT Re-Evaluation 11/26/22    Authorization Type [employer] MEDICARE    PT Start Time 0806    PT Stop Time 0848    PT Time Calculation (min) 42 min    Activity Tolerance Patient tolerated treatment well    Behavior During Therapy WFL for tasks assessed/performed                  Past Medical History:  Diagnosis Date   Anemia    Arthritis    Cardiomyopathy ([place])    Cholelithiasis    Chronic back pain    Constipation    Esophageal reflux    Fibromyalgia    Hemorrhoids    Hyperlipidemia    Hypertension    Hypothyroid    IBS (irritable bowel syndrome)    Internal hemorrhoids without mention of complication    Left leg pain    Myocardial infarction [facility])    Personal history of colonic polyps 05/06/2010   ADENOMATOUS POLYP   Pneumonia    Tubular adenoma of colon    Past Surgical History:  Procedure Laterality Date   ABDOMINAL HYSTERECTOMY     CERVICAL DISC SURGERY     PLATE IN NECK & BACK   CHOLECYSTECTOMY     COLONOSCOPY  09/27/2011   NORMAL    COLONOSCOPY WITH PROPOFOL N/A 10/06/2020   Procedure: COLONOSCOPY WITH PROPOFOL;  Surgeon: Irving Copas., MD;  Location: [facility] ENDOSCOPY;  Service: Gastroenterology;  Laterality: N/A;  ultra slim scope    LEFT HEART CATH AND CORONARY ANGIOGRAPHY N/A 05/08/2018   Procedure: LEFT HEART CATH AND CORONARY ANGIOGRAPHY;  Surgeon: Belva Crome, MD;  Location: [place] CV LAB;  Service: Cardiovascular;  Laterality: N/A;   LUMBAR DISC SURGERY     POLYPECTOMY  10/06/2020   Procedure: POLYPECTOMY;  Surgeon:  Mansouraty, Telford Nab., MD;  Location: [place];  Service: Gastroenterology;;   TONSILLECTOMY     Patient Active Problem List   Diagnosis Date Noted   Hypoglycemia associated with type 2 diabetes mellitus ([place]) 09/23/2022   Somnolence 09/23/2022   Flu vaccine need 09/14/2022   Sensorineural hearing loss (SNHL) of left ear with unrestricted hearing of right ear 07/21/2022   Primary osteoarthritis of left knee 12/24/2021   Onychomycosis of left great toe 12/14/2021   Type II diabetes mellitus with manifestations ([place]) 12/14/2021   [place]ding 05/21/2020   Allergic rhinitis 04/12/2020   Takotsubo cardiomyopathy 02/26/2019   NASH (nonalcoholic steatohepatitis) 01/22/2019   Chronic idiopathic constipation 01/12/2019   Arthritis of carpometacarpal ([place]) joint of left thumb 09/25/2018   Degenerative arthritis of left knee 05/24/2018   CAD (coronary artery disease) 05/16/2018   Vitamin D deficiency 11/11/2017   Degenerative disc disease, lumbar 08/29/2017   Obesity (BMI 30.0-34.9) 06/14/2016   Routine general medical examination at a health care facility 12/31/2014   Osteopenia 07/20/2013   Hypokalemia 06/14/2012   GERD (gastroesophageal reflux disease) 09/16/2011   Generalized anxiety disorder 09/16/2011   Constipation, slow transit 09/16/2011   Personal history of  colonic polyps 08/17/2011   DJD (degenerative joint disease) of knee 06/25/2011   Pure hyperglyceridemia 06/24/2011   Hyperlipidemia LDL goal <70 07/30/2009   Hypothyroidism 04/25/2009   Essential hypertension 04/25/2009    REFERRING DIAG:  Left leg pain, Primary osteoarthritis of left knee, Primary osteoarthritis of left hip   THERAPY DIAG:  Pain in left lower leg  Difficulty in walking, not elsewhere classified  Muscle weakness (generalized)  Rationale for Evaluation and Treatment Rehabilitation  SUBJECTIVE:    SUBJECTIVE STATEMENT: Pt reports no L thigh pain on arrival. Pain is at a low level more than high.  "It's a whole lot better than it was".   PAIN:  Are you having pain? Yes: NPRS scale: 0/10 Pain location: L leg from the hip to her knee Pain description: Deep painful itch/ache (had difficulty describing pain) Aggravating factors: Prolonged standing or walking Relieving factors: Rest, elevation 0-6/10 pain range  PERTINENT HISTORY: Baker's cyst; CAD, HTN   PRECAUTIONS: None   WEIGHT BEARING RESTRICTIONS: No   PATIENT GOALS: To have less pain with her L leg and for it to get stronger.   OBJECTIVE: (objective measures completed at initial evaluation unless otherwise dated)   DIAGNOSTIC FINDINGS: 08/04/22  FINDINGS: Cortical margins of the left femur are intact. There is no evidence of fracture or other focal bone lesions. Mild degenerative change of the hip and knee. Soft tissues are unremarkable.   IMPRESSION: No fracture or acute findings of the left femur. Minimal left hip and knee degenerative change.   FINDINGS: The cortical margins of the tibia and fibula are intact. There is no evidence of fracture or other focal bone lesions. Mild degenerative change of the knee. Ankle alignment is maintained. Soft tissues are unremarkable.   IMPRESSION: No fracture of the left lower leg.   PATIENT SURVEYS:  FOTO NOT CAPTURED   COGNITION: Overall cognitive status: Within functional limits for tasks assessed                         SENSATION: WFL   MUSCLE LENGTH: Hamstrings: Right NT deg; Left NT deg Marcello Moores test: Right NT deg; Left NT deg   POSTURE: forward head   PALPATION: TTP to the L anterior/lateral thigh/hip and inguinal areas   LOWER EXTREMITY ROM:   Active ROM Right eval Left eval Left 10/20/22 Left 11/09/22  Hip flexion   Decreased, limted by pain 87 98 AROM 101 PROM  Hip extension        Hip abduction   Decreased, limted by pain    Hip adduction          Hip internal rotation   Decreased, limted by pain    Hip external rotation   Decreased, limted  by pain    Knee flexion 130d 112d,Decreased, limted by pain 110 125  Knee extension        Ankle dorsiflexion        Ankle plantarflexion        Ankle inversion        Ankle eversion         (Blank rows = not tested)   LOWER EXTREMITY MMT:   MMT Right eval Left eval Lt 11/03/22  Hip flexion 4 3 pain 3+ pain  Hip extension 4 3 pain 3  Hip abduction 4 3 3+  Hip adduction       Hip internal rotation       Hip external rotation 4 3 3+  Knee flexion 5 4+ 5  Knee extension 5 4+ 5  Ankle dorsiflexion       Ankle plantarflexion       Ankle inversion       Ankle eversion        (Blank rows = not tested)   LOWER EXTREMITY SPECIAL TESTS:  Hip special tests: Saralyn Pilar (FABER) test: positive  and FADIR : Positive   FUNCTIONAL TESTS:  5 times sit to stand: 27.4 sec c use of hands from bariatric chair 11/03/22= 20.0 S 2 minute walk test: TBA when less symptomatic: 10/20/22 351 Feet: 11/09/22: 405 Feet   GAIT: Distance walked: 200' Assistive device utilized: None Level of assistance: Complete Independence Comments: moderate antalgic gait pattern initially after standing which decreases to minimal after walking approx 15 ft  [facility] Adult PT Treatment:                                                DATE: 11/16/22 Therapeutic Exercise: Nustep L 5 UE/LE x 5 minutes  Omega knee ext 2x10 10# bilat SLS L and R, equal time at 15" each Lateral step ups 2x10 4" Standing L hip flexion 2x10, 2# Standing hip abduction 2 x 10 each, 2# L Standing hip extension 2 x 10 each, 2# L STS bari-chair  [facility] Adult PT Treatment:                                                DATE: 11/11/22 Therapeutic Exercise: Nustep L 5 UE/LE x 5 minutes  L LAQ 3x10 2# Bridging 2x10, partial Sipine L clam GTB 2x10 Standing L knee lifts 2x10, 2# Standing hip abduction 2 x 10 each, 2# L Standing hip extension 2 x 10 each, 2# L  [facility] Adult PT Treatment:                                                DATE:  11/09/22 Therapeutic Exercise: Nustep L 5 UE/LE x 5 minutes  Standing hip abduction x 10 each Standing hip extension x 10 each  Standing hip flexion alternating x 10 each  STS x 5 SLR 5 x 2 left  Bridge x 10  Updated HEP   Therapeutic Activity: 2 MWT: 404 feet  5 x STS 20 Sec                                                                                                                   PATIENT EDUCATION:  Education details: Eval findings, POC, Self care; use of moist heat for pain management Person educated: Patient Education method: Explanation Education  comprehension: verbalized understanding   HOME EXERCISE PROGRAM: Access Code: [email or web address] URL: [email or web address] Date: 10/12/2022 Prepared by: Gar Ponto  Exercises - Supine Knee Extension Strengthening  - 1 x daily - 7 x weekly - 1 sets - 10 reps - 3 hold - Supine March  - 1 x daily - 7 x weekly - 1 sets - 20 reps - 2 hold - Hooklying Clamshell with Resistance  - 1 x daily - 7 x weekly - 1 sets - 10 reps - 3 hold - Supine Bridge  - 1 x daily - 7 x weekly - 1 sets - 10 reps - 3 hold - Sit to Stand with Counter Support  - 1 x daily - 7 x weekly - 3 sets - 5 reps Added 11/09/22 - Active straight leg raise  - 1 x daily - 7 x weekly - 2 sets - 5 reps - Modified Thomas Stretch  - 1 x daily - 7 x weekly - 1 sets - 3 reps - 30 hold - Standing Hip Abduction with Counter Support  - 1 x daily - 7 x weekly - 2 sets - 10 reps - Standing March with Counter Support  - 1 x daily - 7 x weekly - 2 sets - 10 reps   ASSESSMENT:   CLINICAL IMPRESSION: Pt participated in PT for L LE strengthening and balance with therex completed at a greater demand. Pt tolerated PT today without adverse effects. Pt will continue to benefit from skilled PT to address impairments for improved function of the L LE with less pain. Pt continues to report consistent completion of her HEP. Will reassess strength and function the next visit and  determine DC or continuation of PT at that time.  OBJECTIVE IMPAIRMENTS: decreased activity tolerance, difficulty walking, decreased ROM, decreased strength, impaired flexibility, and pain.    ACTIVITY LIMITATIONS: carrying, lifting, bending, standing, squatting, stairs, dressing, locomotion level, and caring for others   PARTICIPATION LIMITATIONS: meal prep, cleaning, laundry, shopping, community activity, and church   PERSONAL FACTORS: Age, Fitness, and Time since onset of injury/illness/exacerbation are also affecting patient's functional outcome.    GOALS:   SHORT TERM GOALS: Target date: 10/26/2022  Pt will be Ind in an initial HEP Baseline: To be initiated Status: 10/26/22 Goal status: MET    2.  Pt will voice understanding of measures to assist in pain reduction Baseline: Initiated Status: 10/26/22 Goal status: MET   LONG TERM GOALS: Target date: 11/26/22   Pt will be Ind in a final HEP to maintain achieved LOF  Baseline: To be initiated Goal status: ONGOING   2.  Increase L hip strength to 4/5 and L knee strength to 5/5 for improved functional use of the L LE with decreased pain Baseline: see flow sheet Status: 10/04/22- See flow sheet Status: 11/09/22: see objective chart above Goal status: Improving   3.  Increase L knee flex ROM 125d for improved functional use of L LE with decreased pain Baseline: 112d Status: 11/03/22=125 d Goal status: MET   4.  Improve 5xSTS by 10" and 2MWT by MCID of 60f as indication of improved functional mobility  Baseline: 5xSTS=27.4 sec c use of hands from bariatric chair, 2MWT=351 Ft Status: 11/03/22: 5xSTS=20.0" Status: 11/09/22: 2 MWT 405 feet; 20 sec 5x STS Goal status: Improving    5.  Pt will report a decrease in L LE pain to 3/10 or less with daily activities for improved function and QOL Baseline: 0-8/10 Status: 11/03/22=0-7/10, but less  often at a higher level Goal status: Improving   PLAN:   PT FREQUENCY: 2x/week    PT DURATION: 6 weeks   PLANNED INTERVENTIONS: Therapeutic exercises, Therapeutic activity, Balance training, Gait training, Patient/Family education, Self Care, Joint mobilization, Stair training, Aquatic Therapy, Dry Needling, Electrical stimulation, Cryotherapy, Moist heat, Taping, Vasopneumatic device, Ultrasound, Ionotophoresis 56m/ml Dexamethasone, Manual therapy, and Re-evaluation   PLAN FOR NEXT SESSION:  develop HEP; progress therex as indicated; use of modalities, manual therapy; and TPDN as indicated.   Allen Ralls MS, PT 11/16/22 8:48 AM

## 2022-11-18 ENCOUNTER — Telehealth: Payer: Self-pay | Admitting: Internal Medicine

## 2022-11-18 NOTE — Telephone Encounter (Signed)
Pt scheduled for 12/22 @ 11am with Dr. Cathlean Cower.

## 2022-11-18 NOTE — Telephone Encounter (Signed)
Patient is experiencing cold symptoms - she does not want to go to urgent care or have any appointment - she just wants Dr. [name] nurse to call her.  Patients # [phone]

## 2022-11-19 ENCOUNTER — Ambulatory Visit (INDEPENDENT_AMBULATORY_CARE_PROVIDER_SITE_OTHER): Payer: Medicare Other | Admitting: Internal Medicine

## 2022-11-19 ENCOUNTER — Encounter: Payer: Self-pay | Admitting: Internal Medicine

## 2022-11-19 VITALS — BP 112/60 | HR 69 | Temp 98.5°F | Ht 67.0 in | Wt 188.0 lb

## 2022-11-19 DIAGNOSIS — E559 Vitamin D deficiency, unspecified: Secondary | ICD-10-CM | POA: Diagnosis not present

## 2022-11-19 DIAGNOSIS — J0100 Acute maxillary sinusitis, unspecified: Secondary | ICD-10-CM

## 2022-11-19 DIAGNOSIS — J019 Acute sinusitis, unspecified: Secondary | ICD-10-CM | POA: Insufficient documentation

## 2022-11-19 DIAGNOSIS — E118 Type 2 diabetes mellitus with unspecified complications: Secondary | ICD-10-CM

## 2022-11-19 DIAGNOSIS — I10 Essential (primary) hypertension: Secondary | ICD-10-CM

## 2022-11-19 MED ORDER — ONETOUCH VERIO VI STRP: ORAL_STRIP | 0 refills | Status: DC

## 2022-11-19 MED ORDER — DOXYCYCLINE HYCLATE 100 MG PO TABS: 100.0000 mg | ORAL_TABLET | Freq: Two times a day (BID) | ORAL | 0 refills | Status: DC

## 2022-11-19 MED ORDER — GUAIFENESIN-CODEINE 100-10 MG/5ML PO SOLN: 5.0000 mL | Freq: Four times a day (QID) | ORAL | 0 refills | Status: DC | PRN

## 2022-11-19 NOTE — Progress Notes (Signed)
Patient ID: [name], female   DOB: August 30, 1951, 71 y.o.   MRN: [id number]        Chief Complaint: follow up sinus pain with cough, htn, dm, low vit d       HPI:  [name] is a 71 y.o. female  Here with 2-3 days acute onset fever, facial pain, pressure, headache, general weakness and malaise, and greenish d/c, with mild ST and cough, but pt denies chest pain, wheezing, increased sob or doe, orthopnea, PND, increased LE swelling, palpitations, dizziness or syncope.   Pt denies polydipsia, polyuria, or new focal neuro s/s.    Pt denies wt loss, night sweats, loss of appetite, or other constitutional symptoms         Wt Readings from Last 3 Encounters:  11/19/22 188 lb (85.3 kg)  10/27/22 191 lb (86.6 kg)  09/30/22 191 lb (86.6 kg)   BP Readings from Last 3 Encounters:  11/19/22 112/60  09/30/22 126/76  09/23/22 (!) 140/80         Past Medical History:  Diagnosis Date   Anemia    Arthritis    Cardiomyopathy ([place])    Cholelithiasis    Chronic back pain    Constipation    Esophageal reflux    Fibromyalgia    Hemorrhoids    Hyperlipidemia    Hypertension    Hypothyroid    IBS (irritable bowel syndrome)    Internal hemorrhoids without mention of complication    Left leg pain    Myocardial infarction [facility])    Personal history of colonic polyps 05/06/2010   ADENOMATOUS POLYP   Pneumonia    Tubular adenoma of colon    Past Surgical History:  Procedure Laterality Date   ABDOMINAL HYSTERECTOMY     CERVICAL DISC SURGERY     PLATE IN NECK & BACK   CHOLECYSTECTOMY     COLONOSCOPY  09/27/2011   NORMAL    COLONOSCOPY WITH PROPOFOL N/A 10/06/2020   Procedure: COLONOSCOPY WITH PROPOFOL;  Surgeon: Irving Copas., MD;  Location: [facility] ENDOSCOPY;  Service: Gastroenterology;  Laterality: N/A;  ultra slim scope    LEFT HEART CATH AND CORONARY ANGIOGRAPHY N/A 05/08/2018   Procedure: LEFT HEART CATH AND CORONARY ANGIOGRAPHY;  Surgeon: Belva Crome, MD;  Location: [place] CV  LAB;  Service: Cardiovascular;  Laterality: N/A;   LUMBAR DISC SURGERY     POLYPECTOMY  10/06/2020   Procedure: POLYPECTOMY;  Surgeon: Mansouraty, Telford Nab., MD;  Location: [place];  Service: Gastroenterology;;   TONSILLECTOMY      reports that she has never smoked. She has never been exposed to tobacco smoke. She has never used smokeless tobacco. She reports that she does not drink alcohol and does not use drugs. family history includes Breast cancer in her cousin and maternal aunt; COPD in her mother; Dementia in her mother; Diabetes in her mother; Heart failure in her father; Hypertension in her mother; Kidney disease in her mother; Lung disease in her mother. Allergies  Allergen Reactions   Aspirin Rash    unknown   Gadolinium Hives    patient unsure of which kind of dye she had a reaction to until reaction to Magnevist - may be allergic to x-ray contrast, Onset Date: [id number]    Iohexol Hives    patient unsure of which kind of dye she had a reaction to until reaction to Magnevist - may be allergic to x-ray contrast, Onset Date: [id number]   Livalo [Pitavastatin] Other (  See Comments)    myalgias   Hydrocodone-Acetaminophen Rash   Lipitor [Atorvastatin] Other (See Comments)    Myalgias   Current Outpatient Medications on File Prior to Visit  Medication Sig Dispense Refill   amLODipine (NORVASC) 5 MG tablet TAKE ONE TABLET BY MOUTH EVERY MORNING 90 tablet 3   blood glucose meter kit and supplies Dispense based on patient and insurance preference. Use up to two times daily as directed. (FOR ICD-10 E10.9, E11.9). 1 each 0   carvedilol (COREG) 6.25 MG tablet TAKE ONE TABLET BY MOUTH EVERY MORNING and TAKE ONE TABLET BY MOUTH EVERYDAY AT BEDTIME 180 tablet 3   clonazePAM (KLONOPIN) 0.5 MG tablet Take 1 tablet (0.5 mg total) by mouth 2 (two) times daily as needed for anxiety. 180 tablet 1   clopidogrel (PLAVIX) 75 MG tablet Take 1 tablet (75 mg total) by mouth every morning. 90 tablet 3    Continuous Blood Gluc Receiver (FREESTYLE LIBRE 2 READER) DEVI 1 Act by Does not apply route daily. 2 each 5   Continuous Blood Gluc Sensor (FREESTYLE LIBRE 2 SENSOR) MISC 1 Act by Does not apply route daily. 2 each 5   famotidine (PEPCID) 40 MG tablet TAKE ONE TABLET BY MOUTH EVERY MORNING 90 tablet 1   isosorbide mononitrate (IMDUR) 30 MG 24 hr tablet Take 1 tablet (30 mg total) by mouth every morning. 90 tablet 3   Lancets (ONETOUCH DELICA PLUS [id number]) MISC Apply topically.     levocetirizine (XYZAL) 5 MG tablet TAKE ONE TABLET BY MOUTH EVERYDAY AT BEDTIME 90 tablet 1   levothyroxine (SYNTHROID) 100 MCG tablet TAKE ONE TABLET BY MOUTH BEFORE BREAKFAST 90 tablet 0   linaclotide (LINZESS) 145 MCG CAPS capsule Take 1 capsule (145 mcg total) by mouth daily before breakfast. 90 capsule 1   losartan (COZAAR) 25 MG tablet Take 1 tablet (25 mg total) by mouth every morning. 90 tablet 3   magnesium oxide (MAG-OX) 400 MG tablet Take 400 mg by mouth daily.     meloxicam (MOBIC) 15 MG tablet Take 1 tablet (15 mg total) by mouth daily. 30 tablet 0   nitroGLYCERIN (NITROSTAT) 0.4 MG SL tablet Place 1 tablet (0.4 mg total) under the tongue every 5 (five) minutes as needed for chest pain. 25 tablet 6   pantoprazole (PROTONIX) 20 MG tablet TAKE ONE TABLET BY MOUTH EVERY MORNING 90 tablet 0   rosuvastatin (CRESTOR) 10 MG tablet Take 1 tablet (10 mg total) by mouth at bedtime. 90 tablet 3   Vitamin D, Ergocalciferol, (DRISDOL) 1.25 MG (50000 UNIT) CAPS capsule Take 1 capsule (50,000 Units total) by mouth every 7 (seven) days. 12 capsule 0   No current facility-administered medications on file prior to visit.        ROS:  All others reviewed and negative.  Objective        PE:  BP 112/60 (BP Location: Right Arm, Patient Position: Sitting, Cuff Size: Normal)   Pulse 69   Temp 98.5 F (36.9 C) (Oral)   Ht _0  (1.702 m)   Wt 188 lb (85.3 kg)   SpO2 99%   BMI 29.44 kg/m                  Constitutional: Pt appears in NAD               HENT: Head: NCAT.                Right Ear: External ear normal.  Left Ear: External ear normal.  Bilat tm's with mild erythema.  Max sinus areas mild tender.  Pharynx with mild erythema, no exudate                Eyes: . Pupils are equal, round, and reactive to light. Conjunctivae and EOM are normal               Nose: without d/c or deformity               Neck: Neck supple. Gross normal ROM               Cardiovascular: Normal rate and regular rhythm.                 Pulmonary/Chest: Effort normal and breath sounds without rales or wheezing.                               Neurological: Pt is alert. At baseline orientation, motor grossly intact               Skin: Skin is warm. No rashes, no other new lesions, LE edema - none               Psychiatric: Pt behavior is normal without agitation   Micro: none  Cardiac tracings I have personally interpreted today:  none  Pertinent Radiological findings (summarize): none   Lab Results  Component Value Date   WBC 8.2 09/23/2022   HGB 12.5 09/23/2022   HCT 37.7 09/23/2022   PLT 291.0 09/23/2022   GLUCOSE 120 (H) 09/30/2022   CHOL 116 12/14/2021   TRIG 92.0 12/14/2021   HDL 53.40 12/14/2021   LDLDIRECT 88.0 10/19/2016   LDLCALC 44 12/14/2021   ALT 8 09/23/2022   AST 14 09/23/2022   NA 140 09/30/2022   K 3.6 09/30/2022   CL 103 09/30/2022   CREATININE 0.72 09/30/2022   BUN 12 09/30/2022   CO2 30 09/30/2022   TSH 2.32 09/14/2022   INR 1.01 05/08/2018   HGBA1C 6.7 (H) 09/14/2022   MICROALBUR <0.7 09/14/2022   Assessment/Plan:  [name] is a 71 y.o. Black or African American [2] female with  has a past medical history of Anemia, Arthritis, Cardiomyopathy ([place]), Cholelithiasis, Chronic back pain, Constipation, Esophageal reflux, Fibromyalgia, Hemorrhoids, Hyperlipidemia, Hypertension, Hypothyroid, IBS (irritable bowel syndrome), Internal hemorrhoids without mention of  complication, Left leg pain, Myocardial infarction [facility]), Personal history of colonic polyps (05/06/2010), Pneumonia, and Tubular adenoma of colon.  Vitamin D deficiency Last vitamin D Lab Results  Component Value Date   VD25OH 44.41 03/15/2022   Stable, cont oral replacement   Type II diabetes mellitus with manifestations ([place]) Lab Results  Component Value Date   HGBA1C 6.7 (H) 09/14/2022   Stable, pt to continue current medical treatment  - diet, wt control, excercise   Acute sinus infection Mild to mod, for antibx course doxycycline 100 bid,  to f/u any worsening symptoms or concerns  Essential hypertension BP Readings from Last 3 Encounters:  11/19/22 112/60  09/30/22 126/76  09/23/22 (!) 140/80   Stable, pt to continue medical treatment norvasc 5 mg qd, coreg 6.25 bid, losartan 25 mg qd  Followup: No follow-ups on file.  Cathlean Cower, MD 11/19/2022 12:50 PM [facility]

## 2022-11-19 NOTE — Patient Instructions (Signed)
Please take all new medication as prescribed - the antibiotic and cough medicine as needed  Please continue all other medications as before, and refills have been done if requested.  Please have the pharmacy call with any other refills you may need.  Please keep your appointments with your specialists as you may have planned

## 2022-11-19 NOTE — Assessment & Plan Note (Signed)
Lab Results  Component Value Date   HGBA1C 6.7 (H) 09/14/2022   Stable, pt to continue current medical treatment  - diet, wt control, excercise

## 2022-11-19 NOTE — Assessment & Plan Note (Signed)
Last vitamin D Lab Results  Component Value Date   VD25OH 44.41 03/15/2022   Stable, cont oral replacement

## 2022-11-19 NOTE — Assessment & Plan Note (Signed)
BP Readings from Last 3 Encounters:  11/19/22 112/60  09/30/22 126/76  09/23/22 (!) 140/80   Stable, pt to continue medical treatment norvasc 5 mg qd, coreg 6.25 bid, losartan 25 mg qd

## 2022-11-19 NOTE — Assessment & Plan Note (Signed)
Mild to mod, for antibx course doxycycline 100 bid,  to f/u any worsening symptoms or concerns

## 2022-11-25 ENCOUNTER — Ambulatory Visit: Payer: Medicare Other

## 2022-11-25 DIAGNOSIS — M6281 Muscle weakness (generalized): Secondary | ICD-10-CM

## 2022-11-25 DIAGNOSIS — M79662 Pain in left lower leg: Secondary | ICD-10-CM

## 2022-11-25 DIAGNOSIS — R262 Difficulty in walking, not elsewhere classified: Secondary | ICD-10-CM | POA: Diagnosis not present

## 2022-11-25 NOTE — Therapy (Signed)
OUTPATIENT PHYSICAL THERAPY TREATMENT NOTE/Discharge   Patient Name: [name] MRN: [id number] DOB:26-Jul-1951, 71 y.o., female Today's Date: 11/25/2022  PCP: [name], MD  REFERRING PROVIDER: Glennon Mac, DO  See note below for Objective Data and Assessment of Progress/Goals.      END Milas Kocher, MD  SESSION:   PT End of Session - 11/25/22 0810     Visit Number 13    Number of Visits 13    Date for PT Re-Evaluation 11/26/22    Authorization Type [employer] MEDICARE    PT Start Time 0804    PT Stop Time 0846    PT Time Calculation (min) 42 min    Activity Tolerance Patient tolerated treatment well    Behavior During Therapy WFL for tasks assessed/performed                  Past Medical History:  Diagnosis Date   Anemia    Arthritis    Cardiomyopathy ([place])    Cholelithiasis    Chronic back pain    Constipation    Esophageal reflux    Fibromyalgia    Hemorrhoids    Hyperlipidemia    Hypertension    Hypothyroid    IBS (irritable bowel syndrome)    Internal hemorrhoids without mention of complication    Left leg pain    Myocardial infarction [facility])    Personal history of colonic polyps 05/06/2010   ADENOMATOUS POLYP   Pneumonia    Tubular adenoma of colon    Past Surgical History:  Procedure Laterality Date   ABDOMINAL HYSTERECTOMY     CERVICAL DISC SURGERY     PLATE IN NECK & BACK   CHOLECYSTECTOMY     COLONOSCOPY  09/27/2011   NORMAL    COLONOSCOPY WITH PROPOFOL N/A 10/06/2020   Procedure: COLONOSCOPY WITH PROPOFOL;  Surgeon: Irving Copas., MD;  Location: [facility] ENDOSCOPY;  Service: Gastroenterology;  Laterality: N/A;  ultra slim scope    LEFT HEART CATH AND CORONARY ANGIOGRAPHY N/A 05/08/2018   Procedure: LEFT HEART CATH AND CORONARY ANGIOGRAPHY;  Surgeon: Belva Crome, MD;  Location: [place] [facility];  Service: Cardiovascular;  Laterality: N/A;   LUMBAR DISC SURGERY     POLYPECTOMY  10/06/2020   Procedure: POLYPECTOMY;   Surgeon: Rush Landmark Telford Nab., MD;  Location: [place];  Service: Gastroenterology;;   TONSILLECTOMY     Patient Active Problem List   Diagnosis Date Noted   Acute sinus infection 11/19/2022   Hypoglycemia associated with type 2 diabetes mellitus ([place]) 09/23/2022   Somnolence 09/23/2022   Flu vaccine need 09/14/2022   Sensorineural hearing loss (SNHL) of left ear with unrestricted hearing of right ear 07/21/2022   Primary osteoarthritis of left knee 12/24/2021   Onychomycosis of left great toe 12/14/2021   Type II diabetes mellitus with manifestations ([place]) 12/14/2021   [place] 05/21/2020   Allergic rhinitis 04/12/2020   Takotsubo cardiomyopathy 02/26/2019   NASH (nonalcoholic steatohepatitis) 01/22/2019   Chronic idiopathic constipation 01/12/2019   Arthritis of carpometacarpal ([place]) joint of left thumb 09/25/2018   Degenerative arthritis of left knee 05/24/2018   CAD (coronary artery disease) 05/16/2018   Vitamin D deficiency 11/11/2017   Degenerative disc disease, lumbar 08/29/2017   Obesity (BMI 30.0-34.9) 06/14/2016   Routine general medical examination at a health care facility 12/31/2014   Osteopenia 07/20/2013   Hypokalemia 06/14/2012   GERD (gastroesophageal reflux disease) 09/16/2011   Generalized anxiety disorder 09/16/2011   Constipation, slow transit  09/16/2011   Personal history of colonic polyps 08/17/2011   DJD (degenerative joint disease) of knee 06/25/2011   Pure hyperglyceridemia 06/24/2011   Hyperlipidemia LDL goal <70 07/30/2009   Hypothyroidism 04/25/2009   Essential hypertension 04/25/2009    REFERRING DIAG:  Left leg pain, Primary osteoarthritis of left knee, Primary osteoarthritis of left hip   THERAPY DIAG:  Pain in left lower leg  Difficulty in walking, not elsewhere classified  Muscle weakness (generalized)  Rationale for Evaluation and Treatment Rehabilitation  SUBJECTIVE:    SUBJECTIVE STATEMENT: Pt reports being sick c a sinus  infection over the holiday. She reports a little thigh pain today. Pt notes overall her L thigh pain is much better.   PAIN:  Are you having pain? Yes: NPRS scale: 2/10 Pain location: L leg from the hip to her knee Pain description: Deep painful itch/ache (had difficulty describing pain) Aggravating factors: Prolonged standing or walking Relieving factors: Rest, elevation 0-6/10 pain range  PERTINENT HISTORY: Baker's cyst; CAD, HTN   PRECAUTIONS: None   WEIGHT BEARING RESTRICTIONS: No   PATIENT GOALS: To have less pain with her L leg and for it to get stronger.   OBJECTIVE: (objective measures completed at initial evaluation unless otherwise dated)   DIAGNOSTIC FINDINGS: 08/04/22  FINDINGS: Cortical margins of the left femur are intact. There is no evidence of fracture or other focal bone lesions. Mild degenerative change of the hip and knee. Soft tissues are unremarkable.   IMPRESSION: No fracture or acute findings of the left femur. Minimal left hip and knee degenerative change.   FINDINGS: The cortical margins of the tibia and fibula are intact. There is no evidence of fracture or other focal bone lesions. Mild degenerative change of the knee. Ankle alignment is maintained. Soft tissues are unremarkable.   IMPRESSION: No fracture of the left lower leg.   PATIENT SURVEYS:  FOTO NOT CAPTURED   COGNITION: Overall cognitive status: Within functional limits for tasks assessed                         SENSATION: WFL   MUSCLE LENGTH: Hamstrings: Right NT deg; Left NT deg Marcello Moores test: Right NT deg; Left NT deg   POSTURE: forward head   PALPATION: TTP to the L anterior/lateral thigh/hip and inguinal areas   LOWER EXTREMITY ROM:   Active ROM Right eval Left eval Left 10/20/22 Left 11/09/22  Hip flexion   Decreased, limted by pain 87 98 AROM 101 PROM  Hip extension        Hip abduction   Decreased, limted by pain    Hip adduction          Hip internal  rotation   Decreased, limted by pain    Hip external rotation   Decreased, limted by pain    Knee flexion 130d 112d,Decreased, limted by pain 110 125  Knee extension        Ankle dorsiflexion        Ankle plantarflexion        Ankle inversion        Ankle eversion         (Blank rows = not tested)   LOWER EXTREMITY MMT:   MMT Right eval Left eval Lt 11/03/22 LT 11/25/22  Hip flexion 4 3 pain 3+ pain 3+ pain  Hip extension 4 3 pain 3 4  Hip abduction 4 3 3+ 4  Hip adduction  Hip internal rotation        Hip external rotation 4 3 3+ 4+  Knee flexion 5 4+ 5 5  Knee extension 5 4+ 5 5  Ankle dorsiflexion        Ankle plantarflexion        Ankle inversion        Ankle eversion         (Blank rows = not tested)   LOWER EXTREMITY SPECIAL TESTS:  Hip special tests: Saralyn Pilar (FABER) test: positive  and FADIR : Positive   FUNCTIONAL TESTS:  5 times sit to stand: 27.4 sec c use of hands from bariatric chair 11/03/22= 20.0 S 2 minute walk test: TBA when less symptomatic: 10/20/22 351 Feet: 11/09/22: 405 Feet   GAIT: Distance walked: 200' Assistive device utilized: None Level of assistance: Complete Independence Comments: moderate antalgic gait pattern initially after standing which decreases to minimal after walking approx 15 ft  [facility] Adult PT Treatment:                                                DATE: 11/25/22 Therapeutic Exercise: Nustep L 5 UE/LE x 5 minutes  5xSTS Prone quad/knee stretch x2 30" Thomas stretch 2 #0" Omega knee ext 2x10 10# bilat SLS L and R, equal time at 15" each Lateral step ups 2x10 4" Standing L hip flexion 2x10, 2# Standing hip abduction 2 x 10 each, 2# L Standing hip extension 2 x 10 each, 2# L STS bari-chair Updated HEP  [facility] Adult PT Treatment:                                                DATE: 11/16/22 Therapeutic Exercise: Nustep L 5 UE/LE x 5 minutes  Omega knee ext 2x10 10# bilat SLS L and R, equal time at 15" each Lateral  step ups 2x10 4" Standing L hip flexion 2x10, 2# Standing hip abduction 2 x 10 each, 2# L Standing hip extension 2 x 10 each, 2# L STS bari-chair  [facility] Adult PT Treatment:                                                DATE: 11/11/22 Therapeutic Exercise: Nustep L 5 UE/LE x 5 minutes  L LAQ 3x10 2# Bridging 2x10, partial Sipine L clam GTB 2x10 Standing L knee lifts 2x10, 2# Standing hip abduction 2 x 10 each, 2# L Standing hip extension 2 x 10 each, 2# L  PATIENT EDUCATION:  Education details: Eval findings, POC, Self care; use of moist heat for pain management Person educated: Patient Education method: Explanation Education comprehension: verbalized understanding   HOME EXERCISE PROGRAM: Access Code: [email or web address] URL: [email or web address] Date: 11/25/2022 Prepared by: Gar Ponto  Exercises - Modified [place] (Mirrored)  - 1 x daily - 7 x weekly - 1 sets - 3 reps - 30 hold - Prone Quadriceps Stretch with Strap (Mirrored)  - 1 x daily - 7 x weekly - 1 sets - 3 reps - 30 hold - Hooklying Clamshell with Resistance  - 1 x daily - 7 x weekly - 1 sets - 10 reps - 3 hold - Supine Bridge  - 1 x daily - 7 x weekly - 1 sets - 10 reps - 3 hold - Active straight leg raise  - 1 x daily - 7 x weekly - 1 sets - 10-15 reps - Sit to Stand with Counter Support  - 1 x daily - 7 x weekly - 2 sets - 5 reps - Seated Long Arc Quad  - 1 x daily - 7 x weekly - 1 sets - 10-15 reps - Standing Hip Abduction with Counter Support  - 1 x daily - 7 x weekly - 1 sets - 15 reps - Standing March with Counter Support  - 1 x daily - 7 x weekly - 1 sets - 15 reps   ASSESSMENT:   CLINICAL IMPRESSION: Pt completed her last session of PT today with therex for strengthening and stretching of the the L quad/LE. Pt was provided and final HEP and she voiced understanding and returned  demonstration of  the therex. Pt has made good progress in PT re: pain, strength, ROM and function with all goals being partially met or met. Pt is to continue with her HEP to continue the rehab process. Anticipate with completion of her HEP and time, pt will continue to make progress. Pt is in agreement with DC at this time.  OBJECTIVE IMPAIRMENTS: decreased activity tolerance, difficulty walking, decreased ROM, decreased strength, impaired flexibility, and pain.    ACTIVITY LIMITATIONS: carrying, lifting, bending, standing, squatting, stairs, dressing, locomotion level, and caring for others   PARTICIPATION LIMITATIONS: meal prep, cleaning, laundry, shopping, community activity, and church   PERSONAL FACTORS: Age, Fitness, and Time since onset of injury/illness/exacerbation are also affecting patient's functional outcome.    GOALS:   SHORT TERM GOALS: Target date: 10/26/2022  Pt will be Ind in an initial HEP Baseline: To be initiated Status: 10/26/22 Goal status: MET    2.  Pt will voice understanding of measures to assist in pain reduction Baseline: Initiated Status: 10/26/22 Goal status: MET   LONG TERM GOALS: Target date: 11/26/22   Pt will be Ind in a final HEP to maintain achieved LOF  Baseline: To be initiated Goal status: ONGOING   2.  Increase L hip strength to 4/5 and L knee strength to 5/5 for improved functional use of the L LE with decreased pain Baseline: see flow sheet Status: 10/04/22- See flow sheet Status: 11/09/22: see objective chart above Goal status: Improving   3.  Increase L knee flex ROM 125d for improved functional use of L LE with decreased pain Baseline: 112d Status: 11/03/22=125 d Goal status: MET   4.  Improve 5xSTS by 10" and 2MWT by MCID of 35f as indication of improved functional mobility  Baseline: 5xSTS=27.4 sec c use of hands from bariatric chair, 2MWT=351 Ft Status: 11/03/22: 5xSTS=20.0" Status:  11/09/22: 2 MWT 405 feet; 20 sec 5x STS;  11/25/22=14.7"  5xSTS= Goal status: MET    5.  Pt will report a decrease in L LE pain to 3/10 or less with daily activities for improved function and QOL Baseline: 0-8/10 Status: 11/03/22=0-7/10, but less often at a higher level; 11/03/22=0-6/10, at a lower level most of the time Goal status: Partially MET   PLAN:   PT FREQUENCY: 2x/week   PT DURATION: 6 weeks   PLANNED INTERVENTIONS: Therapeutic exercises, Therapeutic activity, Balance training, Gait training, Patient/Family education, Self Care, Joint mobilization, Stair training, Aquatic Therapy, Dry Needling, Electrical stimulation, Cryotherapy, Moist heat, Taping, Vasopneumatic device, Ultrasound, Ionotophoresis 54m/ml Dexamethasone, Manual therapy, and Re-evaluation   PLAN FOR NEXT SESSION:  develop HEP; progress therex as indicated; use of modalities, manual therapy; and TPDN as indicated.   PHYSICAL THERAPY DISCHARGE SUMMARY  Visits from Start of Care: 13  Current functional level related to goals / functional outcomes: See clinical impression and PT goals    Remaining deficits: See clinical impression and PT goals    Education / Equipment: HEP   Patient agrees to discharge. Patient goals were  partially met or met . Patient is being discharged due to being pleased with the current functional level.   Zyria Fiscus MS, PT 11/25/22 10:07 AM
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
## 2022-12-01 ENCOUNTER — Ambulatory Visit (INDEPENDENT_AMBULATORY_CARE_PROVIDER_SITE_OTHER): Payer: PPO | Admitting: Sports Medicine

## 2022-12-01 VITALS — BP 132/82 | HR 72 | Ht 67.0 in | Wt 186.0 lb

## 2022-12-01 DIAGNOSIS — M1712 Unilateral primary osteoarthritis, left knee: Secondary | ICD-10-CM

## 2022-12-01 DIAGNOSIS — M1612 Unilateral primary osteoarthritis, left hip: Secondary | ICD-10-CM | POA: Diagnosis not present

## 2022-12-01 DIAGNOSIS — M79605 Pain in left leg: Secondary | ICD-10-CM

## 2022-12-10 ENCOUNTER — Ambulatory Visit: Payer: Medicare Other | Admitting: Podiatry

## 2022-12-28 DIAGNOSIS — I2510 Atherosclerotic heart disease of native coronary artery without angina pectoris: Secondary | ICD-10-CM | POA: Diagnosis not present

## 2022-12-28 DIAGNOSIS — E663 Overweight: Secondary | ICD-10-CM | POA: Diagnosis not present

## 2022-12-28 DIAGNOSIS — E1169 Type 2 diabetes mellitus with other specified complication: Secondary | ICD-10-CM | POA: Diagnosis not present

## 2022-12-28 DIAGNOSIS — K219 Gastro-esophageal reflux disease without esophagitis: Secondary | ICD-10-CM | POA: Diagnosis not present

## 2022-12-28 DIAGNOSIS — I252 Old myocardial infarction: Secondary | ICD-10-CM | POA: Diagnosis not present

## 2022-12-28 DIAGNOSIS — E785 Hyperlipidemia, unspecified: Secondary | ICD-10-CM | POA: Diagnosis not present

## 2022-12-28 DIAGNOSIS — I10 Essential (primary) hypertension: Secondary | ICD-10-CM | POA: Diagnosis not present

## 2022-12-28 DIAGNOSIS — E1165 Type 2 diabetes mellitus with hyperglycemia: Secondary | ICD-10-CM | POA: Diagnosis not present

## 2022-12-28 DIAGNOSIS — J309 Allergic rhinitis, unspecified: Secondary | ICD-10-CM | POA: Diagnosis not present

## 2022-12-28 DIAGNOSIS — K5900 Constipation, unspecified: Secondary | ICD-10-CM | POA: Diagnosis not present

## 2022-12-28 DIAGNOSIS — E039 Hypothyroidism, unspecified: Secondary | ICD-10-CM | POA: Diagnosis not present

## 2022-12-28 DIAGNOSIS — E559 Vitamin D deficiency, unspecified: Secondary | ICD-10-CM | POA: Diagnosis not present

## 2022-12-29 ENCOUNTER — Other Ambulatory Visit: Payer: Self-pay | Admitting: Physician Assistant

## 2022-12-29 DIAGNOSIS — H903 Sensorineural hearing loss, bilateral: Secondary | ICD-10-CM

## 2022-12-29 DIAGNOSIS — H9312 Tinnitus, left ear: Secondary | ICD-10-CM

## 2023-01-03 ENCOUNTER — Encounter: Payer: Self-pay | Admitting: Internal Medicine

## 2023-01-03 ENCOUNTER — Ambulatory Visit (INDEPENDENT_AMBULATORY_CARE_PROVIDER_SITE_OTHER): Payer: HMO | Admitting: Internal Medicine

## 2023-01-03 VITALS — BP 134/80 | HR 73 | Temp 97.9°F | Ht 67.0 in | Wt 188.0 lb

## 2023-01-03 DIAGNOSIS — E876 Hypokalemia: Secondary | ICD-10-CM | POA: Insufficient documentation

## 2023-01-03 DIAGNOSIS — K5904 Chronic idiopathic constipation: Secondary | ICD-10-CM | POA: Diagnosis not present

## 2023-01-03 DIAGNOSIS — E039 Hypothyroidism, unspecified: Secondary | ICD-10-CM | POA: Diagnosis not present

## 2023-01-03 DIAGNOSIS — Z0001 Encounter for general adult medical examination with abnormal findings: Secondary | ICD-10-CM

## 2023-01-03 DIAGNOSIS — I10 Essential (primary) hypertension: Secondary | ICD-10-CM

## 2023-01-03 DIAGNOSIS — E118 Type 2 diabetes mellitus with unspecified complications: Secondary | ICD-10-CM

## 2023-01-03 DIAGNOSIS — E785 Hyperlipidemia, unspecified: Secondary | ICD-10-CM

## 2023-01-03 LAB — BASIC METABOLIC PANEL
BUN: 8 mg/dL (ref 6–23)
CO2: 29 mEq/L (ref 19–32)
Calcium: 8.9 mg/dL (ref 8.4–10.5)
Chloride: 103 mEq/L (ref 96–112)
Creatinine, Ser: 0.82 mg/dL (ref 0.40–1.20)
GFR: 71.87 mL/min (ref >60.00)
Glucose, Bld: 113 mg/dL — ABNORMAL HIGH (ref 70–99)
Potassium: 3.1 mEq/L — ABNORMAL LOW (ref 3.5–5.1)
Sodium: 140 mEq/L (ref 135–145)

## 2023-01-03 LAB — LIPID PANEL
Cholesterol: 97 mg/dL (ref 0–200)
HDL: 41.3 mg/dL (ref >39.00)
LDL Cholesterol: 40 mg/dL (ref 0–99)
NonHDL: 55.75
Total CHOL/HDL Ratio: 2
Triglycerides: 80 mg/dL (ref 0.0–149.0)
VLDL: 16 mg/dL (ref 0.0–40.0)

## 2023-01-03 LAB — HEMOGLOBIN A1C: Hgb A1c MFr Bld: 6.5 % (ref 4.6–6.5)

## 2023-01-03 LAB — TSH: TSH: 4.08 u[IU]/mL (ref 0.35–5.50)

## 2023-01-03 MED ORDER — LINACLOTIDE 145 MCG PO CAPS: 145.0000 ug | ORAL_CAPSULE | Freq: Every day | ORAL | 1 refills | Status: DC

## 2023-01-03 MED ORDER — POTASSIUM CHLORIDE ER 10 MEQ PO TBCR: 10.0000 meq | EXTENDED_RELEASE_TABLET | Freq: Two times a day (BID) | ORAL | 1 refills | Status: DC

## 2023-01-03 MED ORDER — SHINGRIX 50 MCG/0.5ML IM SUSR: 0.5000 mL | Freq: Once | INTRAMUSCULAR | 1 refills | Status: AC

## 2023-01-03 NOTE — Patient Instructions (Signed)

## 2023-01-03 NOTE — Progress Notes (Unsigned)
Subjective:  Patient ID: [name], female    DOB: [birth year]  Age: 72 y.o. MRN: [id number]  CC: Annual Exam, Hypertension, Hyperlipidemia, Hypothyroidism, Allergic Rhinitis , and Diabetes   HPI [name] presents for a CPX and f/up -  She complains about her scalp.  She tells me she is putting grease and oil on her scalp to treat the itching. She is active and denies chest pain, shortness of breath, diaphoresis, or edema.  Outpatient Medications Prior to Visit  Medication Sig Dispense Refill   amLODipine (NORVASC) 5 MG tablet TAKE ONE TABLET BY MOUTH EVERY MORNING 90 tablet 3   blood glucose meter kit and supplies Dispense based on patient and insurance preference. Use up to two times daily as directed. (FOR ICD-10 E10.9, E11.9). 1 each 0   carvedilol (COREG) 6.25 MG tablet TAKE ONE TABLET BY MOUTH EVERY MORNING and TAKE ONE TABLET BY MOUTH EVERYDAY AT BEDTIME 180 tablet 3   clonazePAM (KLONOPIN) 0.5 MG tablet Take 1 tablet (0.5 mg total) by mouth 2 (two) times daily as needed for anxiety. 180 tablet 1   clopidogrel (PLAVIX) 75 MG tablet Take 1 tablet (75 mg total) by mouth every morning. 90 tablet 3   Continuous Blood Gluc Receiver (FREESTYLE LIBRE 2 READER) DEVI 1 Act by Does not apply route daily. 2 each 5   Continuous Blood Gluc Sensor (FREESTYLE LIBRE 2 SENSOR) MISC 1 Act by Does not apply route daily. 2 each 5   famotidine (PEPCID) 40 MG tablet TAKE ONE TABLET BY MOUTH EVERY MORNING 90 tablet 1   isosorbide mononitrate (IMDUR) 30 MG 24 hr tablet Take 1 tablet (30 mg total) by mouth every morning. 90 tablet 3   Lancets (ONETOUCH DELICA PLUS [id number]) MISC Apply topically.     levocetirizine (XYZAL) 5 MG tablet TAKE ONE TABLET BY MOUTH EVERYDAY AT BEDTIME 90 tablet 1   levothyroxine (SYNTHROID) 100 MCG tablet TAKE ONE TABLET BY MOUTH BEFORE BREAKFAST 90 tablet 0   losartan (COZAAR) 25 MG tablet Take 1 tablet (25 mg total) by mouth every morning. 90 tablet 3   magnesium oxide  (MAG-OX) 400 MG tablet Take 400 mg by mouth daily.     meloxicam (MOBIC) 15 MG tablet Take 1 tablet (15 mg total) by mouth daily. 30 tablet 0   nitroGLYCERIN (NITROSTAT) 0.4 MG SL tablet Place 1 tablet (0.4 mg total) under the tongue every 5 (five) minutes as needed for chest pain. 25 tablet 6   ONETOUCH VERIO test strip SMARTSIG:Via Meter 1-2 Times Daily 100 each 0   pantoprazole (PROTONIX) 20 MG tablet TAKE ONE TABLET BY MOUTH EVERY MORNING 90 tablet 0   rosuvastatin (CRESTOR) 10 MG tablet Take 1 tablet (10 mg total) by mouth at bedtime. 90 tablet 3   Vitamin D, Ergocalciferol, (DRISDOL) 1.25 MG (50000 UNIT) CAPS capsule Take 1 capsule (50,000 Units total) by mouth every 7 (seven) days. 12 capsule 0   doxycycline (VIBRA-TABS) 100 MG tablet Take 1 tablet (100 mg total) by mouth 2 (two) times daily. 20 tablet 0   guaiFENesin-codeine 100-10 MG/5ML syrup Take 5 mLs by mouth every 6 (six) hours as needed for cough. 120 mL 0   linaclotide (LINZESS) 145 MCG CAPS capsule Take 1 capsule (145 mcg total) by mouth daily before breakfast. 90 capsule 1   No facility-administered medications prior to visit.    ROS Review of Systems  Constitutional: Negative.  Negative for appetite change, chills, diaphoresis, fatigue and fever.  HENT: Negative.    Eyes: Negative.   Respiratory:  Negative for cough, chest tightness, shortness of breath and wheezing.   Cardiovascular:  Negative for chest pain, palpitations and leg swelling.  Gastrointestinal:  Positive for constipation. Negative for abdominal pain, blood in stool, diarrhea, nausea and vomiting.  Endocrine: Negative.   Genitourinary: Negative.  Negative for difficulty urinating.  Musculoskeletal: Negative.   Skin: Negative.  Negative for rash.  Neurological:  Negative for dizziness and weakness.  Hematological:  Negative for adenopathy. Does not bruise/bleed easily.  Psychiatric/Behavioral:  Negative for sleep disturbance. The patient is nervous/anxious.      Objective:  BP 134/80 (BP Location: Right Arm, Patient Position: Sitting, Cuff Size: Large)   Pulse 73   Temp 97.9 F (36.6 C) (Oral)   Ht '5\' 7"'$  (1.702 m)   Wt 188 lb (85.3 kg)   SpO2 96%   BMI 29.44 kg/m   BP Readings from Last 3 Encounters:  01/03/23 134/80  12/01/22 132/82  11/19/22 112/60    Wt Readings from Last 3 Encounters:  01/03/23 188 lb (85.3 kg)  12/01/22 186 lb (84.4 kg)  11/19/22 188 lb (85.3 kg)    Physical Exam Vitals reviewed.  Constitutional:      Appearance: Normal appearance.  HENT:     Mouth/Throat:     Mouth: Mucous membranes are moist.  Eyes:     General: No scleral icterus.    Conjunctiva/sclera: Conjunctivae normal.  Cardiovascular:     Rate and Rhythm: Normal rate and regular rhythm.     Heart sounds: No murmur heard.    No gallop.  Pulmonary:     Effort: Pulmonary effort is normal.     Breath sounds: No stridor. No wheezing, rhonchi or rales.  Abdominal:     General: Abdomen is flat.     Palpations: There is no mass.     Tenderness: There is no abdominal tenderness. There is no guarding.     Hernia: No hernia is present.  Musculoskeletal:        General: Normal range of motion.     Cervical back: Neck supple.     Right lower leg: No edema.     Left lower leg: No edema.  Lymphadenopathy:     Cervical: No cervical adenopathy.  Skin:    General: Skin is warm and dry.     Findings: No erythema or rash.  Neurological:     General: No focal deficit present.     Mental Status: She is alert. Mental status is at baseline.  Psychiatric:        Mood and Affect: Mood normal.        Behavior: Behavior normal.     Lab Results  Component Value Date   WBC 8.2 09/23/2022   HGB 12.5 09/23/2022   HCT 37.7 09/23/2022   PLT 291.0 09/23/2022   GLUCOSE 113 (H) 01/03/2023   CHOL 97 01/03/2023   TRIG 80.0 01/03/2023   HDL 41.30 01/03/2023   LDLDIRECT 88.0 10/19/2016   LDLCALC 40 01/03/2023   ALT 8 09/23/2022   AST 14 09/23/2022    NA 140 01/03/2023   K 3.1 (L) 01/03/2023   CL 103 01/03/2023   CREATININE 0.82 01/03/2023   BUN 8 01/03/2023   CO2 29 01/03/2023   TSH 4.08 01/03/2023   INR 1.01 05/08/2018   HGBA1C 6.5 01/03/2023   MICROALBUR <0.7 09/14/2022    MM 3D SCREEN BREAST BILATERAL  Result Date: 09/06/2022 CLINICAL DATA:  Screening. EXAM: DIGITAL SCREENING BILATERAL MAMMOGRAM WITH TOMOSYNTHESIS AND CAD TECHNIQUE: Bilateral screening digital craniocaudal and mediolateral oblique mammograms were obtained. Bilateral screening digital breast tomosynthesis was performed. The images were evaluated with computer-aided detection. COMPARISON:  Prior mammograms dating back to 2008. ACR Breast Density Category b: There are scattered areas of fibroglandular density. FINDINGS: There are no findings suspicious for malignancy. Unchanged appearance from remote studies. IMPRESSION: No mammographic evidence of malignancy. A result letter of this screening mammogram will be mailed directly to the patient. RECOMMENDATION: Screening mammogram in one year. (Code:SM-B-01Y) BI-RADS CATEGORY  1: Negative. Electronically Signed   By: Margarette Canada M.D.   On: 09/06/2022 08:34    Assessment & Plan:   Tenita was seen today for annual exam, hypertension, hyperlipidemia, hypothyroidism, allergic rhinitis  and diabetes.  Diagnoses and all orders for this visit:  Essential hypertension- Her blood pressure is well-controlled. -     Basic metabolic panel; Future -     TSH; Future -     TSH -     Basic metabolic panel -     potassium chloride (KLOR-CON 10) 10 MEQ tablet; Take 1 tablet (10 mEq total) by mouth 2 (two) times daily.  Type II diabetes mellitus with manifestations ([place])- Her blood sugar is well-controlled. -     Basic metabolic panel; Future -     Hemoglobin A1c; Future -     HM Diabetes Foot Exam -     Hemoglobin A1c -     Basic metabolic panel -     glucose blood (CONTOUR NEXT TEST) test strip; 1 each by Other route 2 (two) times  daily. Use as instructed  Hyperlipidemia LDL goal <70- LDL goal achieved. Doing well on the statin  -     Lipid panel; Future -     TSH; Future -     TSH -     Lipid panel  Chronic idiopathic constipation -     linaclotide (LINZESS) 145 MCG CAPS capsule; Take 1 capsule (145 mcg total) by mouth daily before breakfast. -     TSH; Future -     TSH  Acquired hypothyroidism-she is euthyroid. -     TSH; Future -     TSH  Encounter for general adult medical examination with abnormal findings- Exam completed, labs reviewed, vaccines reviewed and updated, cancer screenings are up-to-date, patient education was given -     Zoster Vaccine Adjuvanted [facility]) injection; Inject 0.5 mLs into the muscle once for 1 dose.  Chronic hypokalemia -     potassium chloride (KLOR-CON 10) 10 MEQ tablet; Take 1 tablet (10 mEq total) by mouth 2 (two) times daily.   I have discontinued Jeweliana Dudgeon. Marin's doxycycline and guaiFENesin-codeine. I am also having her start on Shingrix, potassium chloride, and Contour Next Test. Additionally, I am having her maintain her nitroGLYCERIN, magnesium oxide, Vitamin D (Ergocalciferol), clonazePAM, carvedilol, amLODipine, famotidine, meloxicam, isosorbide mononitrate, clopidogrel, losartan, rosuvastatin, FreeStyle Libre 2 Reader, [employer] 2 Sensor, blood glucose meter kit and supplies, levocetirizine, levothyroxine, pantoprazole, OneTouch Delica Plus [id number], OneTouch Verio, and linaclotide.  Meds ordered this encounter  Medications   linaclotide (LINZESS) 145 MCG CAPS capsule    Sig: Take 1 capsule (145 mcg total) by mouth daily before breakfast.    Dispense:  90 capsule    Refill:  1   Zoster Vaccine Adjuvanted [facility]) injection    Sig: Inject 0.5 mLs into the muscle once for 1 dose.    Dispense:  0.5 mL    Refill:  1   potassium chloride (KLOR-CON 10) 10 MEQ tablet    Sig: Take 1 tablet (10 mEq total) by mouth 2 (two) times daily.    Dispense:  180 tablet     Refill:  1   glucose blood (CONTOUR NEXT TEST) test strip    Sig: 1 each by Other route 2 (two) times daily. Use as instructed    Dispense:  200 each    Refill:  1     Follow-up: Return in about 6 months (around 07/04/2023).  [name], MD

## 2023-01-04 DIAGNOSIS — H40003 Preglaucoma, unspecified, bilateral: Secondary | ICD-10-CM | POA: Diagnosis not present

## 2023-01-04 LAB — HM DIABETES EYE EXAM

## 2023-01-04 MED ORDER — CONTOUR NEXT TEST VI STRP: 1.0000 | ORAL_STRIP | Freq: Two times a day (BID) | 1 refills | Status: DC

## 2023-01-04 NOTE — Assessment & Plan Note (Signed)
Exam completed Labs reviewed Vaccines reviewed and updated Cancer screenings are up-to-date Patient education was given 

## 2023-01-05 ENCOUNTER — Other Ambulatory Visit: Payer: Self-pay | Admitting: Internal Medicine

## 2023-01-05 ENCOUNTER — Telehealth: Payer: Self-pay | Admitting: Internal Medicine

## 2023-01-05 DIAGNOSIS — E559 Vitamin D deficiency, unspecified: Secondary | ICD-10-CM

## 2023-01-05 DIAGNOSIS — J302 Other seasonal allergic rhinitis: Secondary | ICD-10-CM

## 2023-01-05 DIAGNOSIS — L309 Dermatitis, unspecified: Secondary | ICD-10-CM | POA: Insufficient documentation

## 2023-01-05 MED ORDER — FLUTICASONE PROPIONATE 50 MCG/ACT NA SUSP: 2.0000 | Freq: Every day | NASAL | 1 refills | Status: DC

## 2023-01-05 MED ORDER — VITAMIN D (ERGOCALCIFEROL) 1.25 MG (50000 UNIT) PO CAPS: 50000.0000 [IU] | ORAL_CAPSULE | ORAL | 0 refills | Status: DC

## 2023-01-05 MED ORDER — CLOBETASOL PROPIONATE EMULSION 0.05 % EX FOAM: 1.0000 | Freq: Two times a day (BID) | CUTANEOUS | 1 refills | Status: DC

## 2023-01-05 MED ORDER — LEVOCETIRIZINE DIHYDROCHLORIDE 5 MG PO TABS: 5.0000 mg | ORAL_TABLET | Freq: Every evening | ORAL | 1 refills | Status: DC

## 2023-01-05 NOTE — Telephone Encounter (Signed)
Pt is requesting a call back from Dr. [name] nurse regarding a medication question, would not give me any specifics.   Please call pt at:  [phone]

## 2023-01-10 ENCOUNTER — Other Ambulatory Visit: Payer: Self-pay | Admitting: Internal Medicine

## 2023-01-10 ENCOUNTER — Telehealth: Payer: Self-pay | Admitting: Internal Medicine

## 2023-01-10 DIAGNOSIS — J302 Other seasonal allergic rhinitis: Secondary | ICD-10-CM

## 2023-01-10 MED ORDER — TRIAMCINOLONE ACETONIDE 55 MCG/ACT NA AERO: 4.0000 | INHALATION_SPRAY | Freq: Every day | NASAL | 3 refills | Status: DC

## 2023-01-10 NOTE — Telephone Encounter (Signed)
Nasocort has been sent to [facility].

## 2023-01-10 NOTE — Telephone Encounter (Signed)
[facility] called and asked if the prescription for the nasacort could be sent to them.

## 2023-01-10 NOTE — Telephone Encounter (Signed)
Pharmacy states the pt does not want the fluticasone [facility]) RX because it makes her sick.   Pt states She and Dr. [name] discussed this and that he was going to send another type steroid nasal spray.   Please call Pharmacy back for clarification at:  [phone]

## 2023-01-12 ENCOUNTER — Other Ambulatory Visit: Payer: Self-pay | Admitting: Family Medicine

## 2023-01-12 DIAGNOSIS — E876 Hypokalemia: Secondary | ICD-10-CM

## 2023-01-17 NOTE — Progress Notes (Unsigned)
Subjective:   [name] is a 72 y.o. female who presents for Medicare Annual (Subsequent) preventive examination.  Review of Systems    ***       Objective:    There were no vitals filed for this visit. There is no height or weight on file to calculate BMI.     10/05/2022    8:12 AM 12/11/2021    2:39 PM 10/06/2020    7:43 AM 08/17/2019    8:36 AM 05/22/2019    8:35 AM 05/13/2018   11:53 AM 05/08/2018   10:25 PM  Advanced Directives  Does Patient Have a Medical Advance Directive? No No No No No No No  Would patient like information on creating a medical advance directive? Yes (MAU/Ambulatory/Procedural Areas - Information given) No - Patient declined No - Patient declined No - Patient declined No - Patient declined No - Patient declined Yes (ED - Information included in AVS)    Current Medications (verified) Outpatient Encounter Medications as of 01/18/2023  Medication Sig   Clobetasol Propionate Emulsion 0.05 % topical foam Apply 1 Application topically 2 (two) times daily.   amLODipine (NORVASC) 5 MG tablet TAKE ONE TABLET BY MOUTH EVERY MORNING   blood glucose meter kit and supplies Dispense based on patient and insurance preference. Use up to two times daily as directed. (FOR ICD-10 E10.9, E11.9).   carvedilol (COREG) 6.25 MG tablet TAKE ONE TABLET BY MOUTH EVERY MORNING and TAKE ONE TABLET BY MOUTH EVERYDAY AT BEDTIME   clonazePAM (KLONOPIN) 0.5 MG tablet Take 1 tablet (0.5 mg total) by mouth 2 (two) times daily as needed for anxiety.   clopidogrel (PLAVIX) 75 MG tablet Take 1 tablet (75 mg total) by mouth every morning.   Continuous Blood Gluc Receiver (FREESTYLE LIBRE 2 READER) DEVI 1 Act by Does not apply route daily.   Continuous Blood Gluc Sensor (FREESTYLE LIBRE 2 SENSOR) MISC 1 Act by Does not apply route daily.   famotidine (PEPCID) 40 MG tablet TAKE ONE TABLET BY MOUTH EVERY MORNING   glucose blood (CONTOUR NEXT TEST) test strip 1 each by Other route 2 (two) times  daily. Use as instructed   isosorbide mononitrate (IMDUR) 30 MG 24 hr tablet Take 1 tablet (30 mg total) by mouth every morning.   Lancets (ONETOUCH DELICA PLUS 123XX123) MISC Apply topically.   levocetirizine (XYZAL) 5 MG tablet Take 1 tablet (5 mg total) by mouth every evening.   levothyroxine (SYNTHROID) 100 MCG tablet TAKE ONE TABLET BY MOUTH BEFORE BREAKFAST   linaclotide (LINZESS) 145 MCG CAPS capsule Take 1 capsule (145 mcg total) by mouth daily before breakfast.   losartan (COZAAR) 25 MG tablet Take 1 tablet (25 mg total) by mouth every morning.   magnesium oxide (MAG-OX) 400 MG tablet Take 400 mg by mouth daily.   meloxicam (MOBIC) 15 MG tablet Take 1 tablet (15 mg total) by mouth daily.   nitroGLYCERIN (NITROSTAT) 0.4 MG SL tablet Place 1 tablet (0.4 mg total) under the tongue every 5 (five) minutes as needed for chest pain.   ONETOUCH VERIO test strip SMARTSIG:Via Meter 1-2 Times Daily   pantoprazole (PROTONIX) 20 MG tablet TAKE ONE TABLET BY MOUTH EVERY MORNING   potassium chloride (KLOR-CON 10) 10 MEQ tablet Take 1 tablet (10 mEq total) by mouth 2 (two) times daily.   rosuvastatin (CRESTOR) 10 MG tablet Take 1 tablet (10 mg total) by mouth at bedtime.   triamcinolone (NASACORT) 55 MCG/ACT AERO nasal inhaler Place 4  sprays into the nose daily.   Vitamin D, Ergocalciferol, (DRISDOL) 1.25 MG (50000 UNIT) CAPS capsule Take 1 capsule (50,000 Units total) by mouth every 7 (seven) days.   No facility-administered encounter medications on file as of 01/18/2023.    Allergies (verified) Aspirin, Gadolinium, Iohexol, Livalo [pitavastatin], Hydrocodone-acetaminophen, and Lipitor [atorvastatin]   History: Past Medical History:  Diagnosis Date   Anemia    Arthritis    Cardiomyopathy ([place])    Cholelithiasis    Chronic back pain    Constipation    Esophageal reflux    Fibromyalgia    Hemorrhoids    Hyperlipidemia    Hypertension    Hypothyroid    IBS (irritable bowel syndrome)     Internal hemorrhoids without mention of complication    Left leg pain    Myocardial infarction [facility])    Personal history of colonic polyps 05/06/2010   ADENOMATOUS POLYP   Pneumonia    Tubular adenoma of colon    Past Surgical History:  Procedure Laterality Date   ABDOMINAL HYSTERECTOMY     CERVICAL DISC SURGERY     PLATE IN NECK & BACK   CHOLECYSTECTOMY     COLONOSCOPY  09/27/2011   NORMAL    COLONOSCOPY WITH PROPOFOL N/A 10/06/2020   Procedure: COLONOSCOPY WITH PROPOFOL;  Surgeon: Irving Copas., MD;  Location: [facility] ENDOSCOPY;  Service: Gastroenterology;  Laterality: N/A;  ultra slim scope    LEFT HEART CATH AND CORONARY ANGIOGRAPHY N/A 05/08/2018   Procedure: LEFT HEART CATH AND CORONARY ANGIOGRAPHY;  Surgeon: Belva Crome, MD;  Location: [place] CV LAB;  Service: Cardiovascular;  Laterality: N/A;   LUMBAR DISC SURGERY     POLYPECTOMY  10/06/2020   Procedure: POLYPECTOMY;  Surgeon: Mansouraty, Telford Nab., MD;  Location: [facility] ENDOSCOPY;  Service: Gastroenterology;;   TONSILLECTOMY     Family History  Problem Relation Age of Onset   Diabetes Mother        aunts and uncles   Kidney disease Mother        stage 38   Lung disease Mother        colapsed lung/in hospice   COPD Mother    Hypertension Mother    Dementia Mother    Breast cancer Maternal Aunt    Heart failure Father        Mother   Breast cancer Cousin    Colon cancer Neg Hx    Esophageal cancer Neg Hx    Stomach cancer Neg Hx    Rectal cancer Neg Hx    Social History   Socioeconomic History   Marital status: Widowed    Spouse name: Not on file   Number of children: 2   Years of education: Not on file   Highest education level: Not on file  Occupational History   Occupation: housewife    Employer: UNEMPLOYED   Tobacco Use   Smoking status: Never    Passive exposure: Never   Smokeless tobacco: Never  Vaping Use   Vaping Use: Never used  Substance and Sexual Activity   Alcohol use: No     Alcohol/week: 0.0 standard drinks of alcohol   Drug use: No   Sexual activity: Not Currently  Other Topics Concern   Not on file  Social History Narrative   Daily Caffeine Use   Social Determinants of Health   Financial Resource Strain: Low Risk  (12/11/2021)   Overall Financial Resource Strain (CARDIA)    Difficulty of Paying Living Expenses:  Not hard at all  Food Insecurity: No Food Insecurity (12/11/2021)   Hunger Vital Sign    Worried About Running Out of Food in the Last Year: Never true    Ran Out of Food in the Last Year: Never true  Transportation Needs: No Transportation Needs (12/11/2021)   PRAPARE - Hydrologist (Medical): No    Lack of Transportation (Non-Medical): No  Physical Activity: Inactive (12/11/2021)   Exercise Vital Sign    Days of Exercise per Week: 0 days    Minutes of Exercise per Session: 0 min  Stress: No Stress Concern Present (12/11/2021)   [place]    Feeling of Stress : Not at all  Social Connections: Moderately Isolated (12/11/2021)   Social Connection and Isolation Panel [NHANES]    Frequency of Communication with Friends and Family: Twice a week    Frequency of Social Gatherings with Friends and Family: Twice a week    Attends Religious Services: More than 4 times per year    Active Member of [employer] or Organizations: No    Attends Archivist Meetings: Never    Marital Status: Widowed    Tobacco Counseling Counseling given: Not Answered   Clinical Intake:                 Diabetic?Yes   Nutrition Risk Assessment:  Has the patient had any N/V/D within the last 2 months?  {YES/NO:21197} Does the patient have any non-healing wounds?  {YES/NO:21197} Has the patient had any unintentional weight loss or weight gain?  {YES/NO:21197}  Diabetes:  Is the patient diabetic?  {YES/NO:21197} If diabetic, was a CBG obtained today?   {YES/NO:21197} Did the patient bring in their glucometer from home?  {YES/NO:21197} How often do you monitor your CBG's? ***.   Financial Strains and Diabetes Management:  Are you having any financial strains with the device, your supplies or your medication? {YES/NO:21197}.  Does the patient want to be seen by Chronic Care Management for management of their diabetes?  {YES/NO:21197} Would the patient like to be referred to a Nutritionist or for Diabetic Management?  {YES/NO:21197}  Diabetic Exams:  Diabetic Eye Exam: Completed 01/04/23 Diabetic Foot Exam: Completed 01/03/23          Activities of Daily Living     No data to display          Patient Care Team: [name], MD as PCP - Cyndia Diver, MD as PCP - Cardiology (Cardiology) Szabat, Darnelle Maffucci, [facility] (Inactive) as Pharmacist (Pharmacist)  Indicate any recent Medical Services you may have received from other than [facility] providers in the past year (date may be approximate).     Assessment:   This is a routine wellness examination for [name].  Hearing/Vision screen No results found.  Dietary issues and exercise activities discussed:     Goals Addressed   None    Depression Screen    07/21/2022    9:34 AM 12/11/2021    2:40 PM 12/11/2021    2:38 PM 03/30/2021    8:02 AM 05/21/2020    9:05 AM 05/22/2019    8:36 AM 10/19/2018    4:07 PM  PHQ 2/9 Scores  PHQ - 2 Score 0 0 0 0 0 0 0    Fall Risk    07/21/2022    9:34 AM 12/11/2021    2:39 PM 06/11/2021    8:24 AM 05/21/2020  9:05 AM 05/22/2019    8:36 AM  Fall Risk   Falls in the past year? 0 0 0 0 0  Number falls in past yr: 0 0  0 0  Injury with Fall? 0 0  0   Risk for fall due to : No Fall Risks   Impaired balance/gait   Follow up Falls evaluation completed Falls evaluation completed  Falls evaluation completed     [place]:  Any stairs in or around the home? {YES/NO:21197} If so, are there any without  handrails? {YES/NO:21197} Home free of loose throw rugs in walkways, pet beds, electrical cords, etc? {YES/NO:21197} Adequate lighting in your home to reduce risk of falls? {YES/NO:21197}  ASSISTIVE DEVICES UTILIZED TO PREVENT FALLS:  Life alert? {YES/NO:21197} Use of a cane, walker or w/c? {YES/NO:21197} Grab bars in the bathroom? {YES/NO:21197} Shower chair or bench in shower? {YES/NO:21197} Elevated toilet seat or a handicapped toilet? {YES/NO:21197}  TIMED UP AND GO:  Was the test performed? No . Telephonic visit   Cognitive Function:        Immunizations Immunization History  Administered Date(s) Administered   Fluad Quad(high Dose 65+) 08/13/2019, 12/31/2020, 09/14/2022   Influenza Split 11/15/2011   Influenza Whole 12/11/2009   Influenza, High Dose Seasonal PF 10/19/2016, 12/01/2017, 11/06/2018   Influenza,inj,Quad PF,6+ Mos 08/10/2013, 09/18/2015   PFIZER(Purple Top)SARS-COV-2 Vaccination 03/07/2020, 03/31/2020, 11/17/2020   Pneumococcal Conjugate-13 06/14/2016   Pneumococcal Polysaccharide-23 07/20/2013, 01/12/2019   Td 07/16/2009, 06/23/2010   Tdap 06/30/2020   Zoster, Live 11/06/2013    TDAP status: Up to date  Flu Vaccine status: Up to date  Pneumococcal vaccine status: Up to date  Covid-19 vaccine status: Information provided on how to obtain vaccines.   Qualifies for Shingles Vaccine? Yes   Zostavax completed No   Shingrix Completed?: No.    Education has been provided regarding the importance of this vaccine. Patient has been advised to call insurance company to determine out of pocket expense if they have not yet received this vaccine. Advised may also receive vaccine at local pharmacy or Health Dept. Verbalized acceptance and understanding.  Screening Tests Health Maintenance  Topic Date Due   Zoster Vaccines- Shingrix (1 of 2) Never done   COVID-19 Vaccine (4 - 2023-24 season) 07/30/2022   Medicare Annual Wellness (AWV)  05/11/2023   HEMOGLOBIN  A1C  07/04/2023   MAMMOGRAM  09/04/2023   Diabetic kidney evaluation - Urine ACR  09/15/2023   COLONOSCOPY (Pts 45-16yr Insurance coverage will need to be confirmed)  10/07/2023   Diabetic kidney evaluation - eGFR measurement  01/04/2024   FOOT EXAM  01/04/2024   OPHTHALMOLOGY EXAM  01/05/2024   DTaP/Tdap/Td (4 - Td or Tdap) 06/30/2030   Pneumonia Vaccine 72 Years old  Completed   INFLUENZA VACCINE  Completed   DEXA SCAN  Completed   Hepatitis C Screening  Completed   HPV VACCINES  Aged Out    Health Maintenance  Health Maintenance Due  Topic Date Due   Zoster Vaccines- Shingrix (1 of 2) Never done   COVID-19 Vaccine (4 - 2023-24 season) 07/30/2022    Colorectal cancer screening: Type of screening: Colonoscopy. Completed 10/06/20. Repeat every 3 years  Mammogram status: Completed 09/03/22. Repeat every year  Bone Density status: Completed 05/10/22. Results reflect: Bone density results: NORMAL. Repeat every 2 years.  Lung Cancer Screening: (Low Dose CT Chest recommended if Age 72-80years, 30 pack-year currently smoking OR have quit w/in 15years.) does not  qualify.   Lung Cancer Screening Referral: n/a  Additional Screening:  Hepatitis C Screening: does qualify; Completed 01/05/22  Vision Screening: Recommended annual ophthalmology exams for early detection of glaucoma and other disorders of the eye. Is the patient up to date with their annual eye exam?  {YES/NO:21197} Who is the provider or what is the name of the office in which the patient attends annual eye exams? *** If pt is not established with a provider, would they like to be referred to a provider to establish care? {YES/NO:21197}.   Dental Screening: Recommended annual dental exams for proper oral hygiene  Community Resource Referral / Chronic Care Management: CRR required this visit?  {YES/NO:21197}  CCM required this visit?  {YES/NO:21197}     Plan:     I have personally reviewed and noted the following  in the patient's chart:   Medical and social history Use of alcohol, tobacco or illicit drugs  Current medications and supplements including opioid prescriptions. {Opioid Prescriptions:[phone]} Functional ability and status Nutritional status Physical activity Advanced directives List of other physicians Hospitalizations, surgeries, and ER visits in previous 12 months Vitals Screenings to include cognitive, depression, and falls Referrals and appointments  In addition, I have reviewed and discussed with patient certain preventive protocols, quality metrics, and best practice recommendations. A written personalized care plan for preventive services as well as general preventive health recommendations were provided to patient.     Vanetta Mulders, [place]   [id number]   Due to this being a virtual visit, the after visit summary with patients personalized plan was offered to patient via mail or my-chart. ***Patient declined at this time./ Patient would like to access on my-chart/ per request, patient was mailed a copy of AVS./ Patient preferred to pick up at office at next visit   Nurse Notes: ***

## 2023-01-17 NOTE — Patient Instructions (Signed)
[name] , Thank you for taking time to come for your Medicare Wellness Visit. I appreciate your ongoing commitment to your health goals. Please review the following plan we discussed and let me know if I can assist you in the future.   These are the goals we discussed:  Goals      Manage My Medicine     Timeframe:  Long-Range Goal Priority:  High Start Date:       09/21/21                      Expected End Date:      09/21/22                 Follow Up Date April 2023   - call for medicine refill 2 or 3 days before it runs out - call if I am sick and can't take my medicine - keep a list of all the medicines I take; vitamins and herbals too - use a pillbox to sort medicine    Why is this important?   These steps will help you keep on track with your medicines.   Notes:      Patient Stated     I want to continue to eat healthy and exercise. Enjoy life, family and worship God.     Patient Stated     I want to eat healthier by eating more vegetables and drink more water.        This is a list of the screening recommended for you and due dates:  Health Maintenance  Topic Date Due   Zoster (Shingles) Vaccine (1 of 2) Never done   COVID-19 Vaccine (4 - 2023-24 season) 07/30/2022   Medicare Annual Wellness Visit  05/11/2023   Hemoglobin A1C  07/04/2023   Mammogram  09/04/2023   Yearly kidney health urinalysis for diabetes  09/15/2023   Colon Cancer Screening  10/07/2023   Yearly kidney function blood test for diabetes  01/04/2024   Complete foot exam   01/04/2024   Eye exam for diabetics  01/05/2024   DTaP/Tdap/Td vaccine (4 - Td or Tdap) 06/30/2030   Pneumonia Vaccine  Completed   Flu Shot  Completed   DEXA scan (bone density measurement)  Completed   Hepatitis C Screening: USPSTF Recommendation to screen - Ages 48-79 yo.  Completed   HPV Vaccine  Aged Out    Advanced directives: ***  Conditions/risks identified: Aim for 30 minutes of exercise or brisk walking, 6-8  glasses of water, and 5 servings of fruits and vegetables each day.   Next appointment: Follow up in one year for your annual wellness visit    Preventive Care 65 Years and Older, Female Preventive care refers to lifestyle choices and visits with your health care provider that can promote health and wellness. What does preventive care include? A yearly physical exam. This is also called an annual well check. Dental exams once or twice a year. Routine eye exams. Ask your health care provider how often you should have your eyes checked. Personal lifestyle choices, including: Daily care of your teeth and gums. Regular physical activity. Eating a healthy diet. Avoiding tobacco and drug use. Limiting alcohol use. Practicing safe sex. Taking low-dose aspirin every day. Taking vitamin and mineral supplements as recommended by your health care provider. What happens during an annual well check? The services and screenings done by your health care provider during your annual well check will depend on your  age, overall health, lifestyle risk factors, and family history of disease. Counseling  Your health care provider may ask you questions about your: Alcohol use. Tobacco use. Drug use. Emotional well-being. Home and relationship well-being. Sexual activity. Eating habits. History of falls. Memory and ability to understand (cognition). Work and work Statistician. Reproductive health. Screening  You may have the following tests or measurements: Height, weight, and BMI. Blood pressure. Lipid and cholesterol levels. These may be checked every 5 years, or more frequently if you are over 2 years old. Skin check. Lung cancer screening. You may have this screening every year starting at age 3 if you have a 30-pack-year history of smoking and currently smoke or have quit within the past 15 years. Fecal occult blood test (FOBT) of the stool. You may have this test every year starting at age  20. Flexible sigmoidoscopy or colonoscopy. You may have a sigmoidoscopy every 5 years or a colonoscopy every 10 years starting at age 50. Hepatitis C blood test. Hepatitis B blood test. Sexually transmitted disease (STD) testing. Diabetes screening. This is done by checking your blood sugar (glucose) after you have not eaten for a while (fasting). You may have this done every 1-3 years. Bone density scan. This is done to screen for osteoporosis. You may have this done starting at age 20. Mammogram. This may be done every 1-2 years. Talk to your health care provider about how often you should have regular mammograms. Talk with your health care provider about your test results, treatment options, and if necessary, the need for more tests. Vaccines  Your health care provider may recommend certain vaccines, such as: Influenza vaccine. This is recommended every year. Tetanus, diphtheria, and acellular pertussis (Tdap, Td) vaccine. You may need a Td booster every 10 years. Zoster vaccine. You may need this after age 75. Pneumococcal 13-valent conjugate (PCV13) vaccine. One dose is recommended after age 62. Pneumococcal polysaccharide (PPSV23) vaccine. One dose is recommended after age 49. Talk to your health care provider about which screenings and vaccines you need and how often you need them. This information is not intended to replace advice given to you by your health care provider. Make sure you discuss any questions you have with your health care provider. Document Released: 12/12/2015 Document Revised: 08/04/2016 Document Reviewed: 09/16/2015 Elsevier Interactive Patient Education  2017 [place] Prevention in the Home Falls can cause injuries. They can happen to people of all ages. There are many things you can do to make your home safe and to help prevent falls. What can I do on the outside of my home? Regularly fix the edges of walkways and driveways and fix any cracks. Remove  anything that might make you trip as you walk through a door, such as a raised step or threshold. Trim any bushes or trees on the path to your home. Use bright outdoor lighting. Clear any walking paths of anything that might make someone trip, such as rocks or tools. Regularly check to see if handrails are loose or broken. Make sure that both sides of any steps have handrails. Any raised decks and porches should have guardrails on the edges. Have any leaves, snow, or ice cleared regularly. Use sand or salt on walking paths during winter. Clean up any spills in your garage right away. This includes oil or grease spills. What can I do in the bathroom? Use night lights. Install grab bars by the toilet and in the tub and shower. Do not use  towel bars as grab bars. Use non-skid mats or decals in the tub or shower. If you need to sit down in the shower, use a plastic, non-slip stool. Keep the floor dry. Clean up any water that spills on the floor as soon as it happens. Remove soap buildup in the tub or shower regularly. Attach bath mats securely with double-sided non-slip rug tape. Do not have throw rugs and other things on the floor that can make you trip. What can I do in the bedroom? Use night lights. Make sure that you have a light by your bed that is easy to reach. Do not use any sheets or blankets that are too big for your bed. They should not hang down onto the floor. Have a firm chair that has side arms. You can use this for support while you get dressed. Do not have throw rugs and other things on the floor that can make you trip. What can I do in the kitchen? Clean up any spills right away. Avoid walking on wet floors. Keep items that you use a lot in easy-to-reach places. If you need to reach something above you, use a strong step stool that has a grab bar. Keep electrical cords out of the way. Do not use floor polish or wax that makes floors slippery. If you must use wax, use  non-skid floor wax. Do not have throw rugs and other things on the floor that can make you trip. What can I do with my stairs? Do not leave any items on the stairs. Make sure that there are handrails on both sides of the stairs and use them. Fix handrails that are broken or loose. Make sure that handrails are as long as the stairways. Check any carpeting to make sure that it is firmly attached to the stairs. Fix any carpet that is loose or worn. Avoid having throw rugs at the top or bottom of the stairs. If you do have throw rugs, attach them to the floor with carpet tape. Make sure that you have a light switch at the top of the stairs and the bottom of the stairs. If you do not have them, ask someone to add them for you. What else can I do to help prevent falls? Wear shoes that: Do not have high heels. Have rubber bottoms. Are comfortable and fit you well. Are closed at the toe. Do not wear sandals. If you use a stepladder: Make sure that it is fully opened. Do not climb a closed stepladder. Make sure that both sides of the stepladder are locked into place. Ask someone to hold it for you, if possible. Clearly mark and make sure that you can see: Any grab bars or handrails. First and last steps. Where the edge of each step is. Use tools that help you move around (mobility aids) if they are needed. These include: Canes. Walkers. Scooters. Crutches. Turn on the lights when you go into a dark area. Replace any light bulbs as soon as they burn out. Set up your furniture so you have a clear path. Avoid moving your furniture around. If any of your floors are uneven, fix them. If there are any pets around you, be aware of where they are. Review your medicines with your doctor. Some medicines can make you feel dizzy. This can increase your chance of falling. Ask your doctor what other things that you can do to help prevent falls. This information is not intended to replace advice given to  you by your health care provider. Make sure you discuss any questions you have with your health care provider. Document Released: 09/11/2009 Document Revised: 04/22/2016 Document Reviewed: 12/20/2014 Elsevier Interactive Patient Education  2017 [employer].

## 2023-01-18 ENCOUNTER — Telehealth: Payer: Self-pay

## 2023-01-18 ENCOUNTER — Ambulatory Visit (INDEPENDENT_AMBULATORY_CARE_PROVIDER_SITE_OTHER): Payer: HMO

## 2023-01-18 VITALS — Ht 67.0 in | Wt 188.0 lb

## 2023-01-18 DIAGNOSIS — Z0000 Encounter for general adult medical examination without abnormal findings: Secondary | ICD-10-CM | POA: Diagnosis not present

## 2023-01-18 NOTE — Telephone Encounter (Signed)
Seen for [name] and patient states that she is unable to afford Clobetasol foam that was recently prescribed the copay is $100. She is asking for an alternative.  Also would like to know if prior authorization can be done on Contour Next meter testing strips so that she can get these from [facility].  This is the only meter that she feels comfortable with using.

## 2023-01-21 NOTE — Telephone Encounter (Addendum)
Pt has called and stated she   unable to afford Clobetasol foam that was recently prescribed the copay is $100. She is asking for an alternative.  Also would like to know if prior authorization can be done on Contour Next meter testing strips so that she can get these from [facility].  This is the only meter that she feels comfortable with using.   **Pt wants Allegra rx'd for her allergies. She cannot take generic Flonase as it makes her sick.  ** Please give pt a call back with this update.

## 2023-01-22 ENCOUNTER — Ambulatory Visit
Admission: RE | Admit: 2023-01-22 | Discharge: 2023-01-22 | Disposition: A | Discharge status: Home / Self Care | Payer: PPO | Source: Ambulatory Visit | Attending: Physician Assistant | Admitting: Physician Assistant

## 2023-01-22 DIAGNOSIS — I6782 Cerebral ischemia: Secondary | ICD-10-CM | POA: Diagnosis not present

## 2023-01-22 DIAGNOSIS — H9312 Tinnitus, left ear: Secondary | ICD-10-CM

## 2023-01-22 DIAGNOSIS — H903 Sensorineural hearing loss, bilateral: Secondary | ICD-10-CM

## 2023-01-22 MED ORDER — GADOPICLENOL 0.5 MMOL/ML IV SOLN
9.0000 mL | Freq: Once | INTRAVENOUS | Status: AC | PRN
  Administered 2023-01-22: 9 mL via INTRAVENOUS

## 2023-01-23 ENCOUNTER — Other Ambulatory Visit: Payer: Self-pay | Admitting: Internal Medicine

## 2023-01-23 DIAGNOSIS — L309 Dermatitis, unspecified: Secondary | ICD-10-CM

## 2023-01-23 MED ORDER — TRIAMCINOLONE ACETONIDE 0.5 % EX CREA: 1.0000 | TOPICAL_CREAM | Freq: Three times a day (TID) | CUTANEOUS | 2 refills | Status: DC

## 2023-02-02 ENCOUNTER — Other Ambulatory Visit: Payer: Self-pay | Admitting: Internal Medicine

## 2023-02-02 DIAGNOSIS — E039 Hypothyroidism, unspecified: Secondary | ICD-10-CM

## 2023-02-02 DIAGNOSIS — K2100 Gastro-esophageal reflux disease with esophagitis, without bleeding: Secondary | ICD-10-CM

## 2023-02-02 DIAGNOSIS — K219 Gastro-esophageal reflux disease without esophagitis: Secondary | ICD-10-CM

## 2023-03-08 DIAGNOSIS — H903 Sensorineural hearing loss, bilateral: Secondary | ICD-10-CM | POA: Diagnosis not present

## 2023-03-16 ENCOUNTER — Encounter: Payer: Self-pay | Admitting: Internal Medicine

## 2023-03-16 ENCOUNTER — Ambulatory Visit (INDEPENDENT_AMBULATORY_CARE_PROVIDER_SITE_OTHER): Payer: HMO | Admitting: Internal Medicine

## 2023-03-16 VITALS — BP 136/84 | HR 68 | Temp 98.0°F | Ht 67.0 in | Wt 182.0 lb

## 2023-03-16 DIAGNOSIS — K5904 Chronic idiopathic constipation: Secondary | ICD-10-CM

## 2023-03-16 DIAGNOSIS — E118 Type 2 diabetes mellitus with unspecified complications: Secondary | ICD-10-CM

## 2023-03-16 DIAGNOSIS — E876 Hypokalemia: Secondary | ICD-10-CM | POA: Diagnosis not present

## 2023-03-16 DIAGNOSIS — I2510 Atherosclerotic heart disease of native coronary artery without angina pectoris: Secondary | ICD-10-CM | POA: Diagnosis not present

## 2023-03-16 DIAGNOSIS — I2584 Coronary atherosclerosis due to calcified coronary lesion: Secondary | ICD-10-CM | POA: Diagnosis not present

## 2023-03-16 DIAGNOSIS — E039 Hypothyroidism, unspecified: Secondary | ICD-10-CM | POA: Diagnosis not present

## 2023-03-16 DIAGNOSIS — I10 Essential (primary) hypertension: Secondary | ICD-10-CM | POA: Diagnosis not present

## 2023-03-16 LAB — POCT GLYCOSYLATED HEMOGLOBIN (HGB A1C): Hemoglobin A1C: 5.8 % — AB (ref 4.0–5.6)

## 2023-03-16 LAB — BASIC METABOLIC PANEL
BUN: 11 mg/dL (ref 6–23)
CO2: 29 mEq/L (ref 19–32)
Calcium: 9.1 mg/dL (ref 8.4–10.5)
Chloride: 105 mEq/L (ref 96–112)
Creatinine, Ser: 0.82 mg/dL (ref 0.40–1.20)
GFR: 71.77 mL/min (ref >60.00)
Glucose, Bld: 105 mg/dL — ABNORMAL HIGH (ref 70–99)
Potassium: 3.7 mEq/L (ref 3.5–5.1)
Sodium: 140 mEq/L (ref 135–145)

## 2023-03-16 MED ORDER — LEVOTHYROXINE SODIUM 100 MCG PO TABS: 100.0000 ug | ORAL_TABLET | Freq: Every day | ORAL | 1 refills | Status: DC

## 2023-03-16 MED ORDER — LINACLOTIDE 145 MCG PO CAPS: 145.0000 ug | ORAL_CAPSULE | Freq: Every day | ORAL | 1 refills | Status: AC

## 2023-03-16 MED ORDER — POTASSIUM CHLORIDE ER 10 MEQ PO TBCR: 10.0000 meq | EXTENDED_RELEASE_TABLET | Freq: Two times a day (BID) | ORAL | 1 refills | Status: DC

## 2023-03-16 MED ORDER — CARVEDILOL 6.25 MG PO TABS: 6.2500 mg | ORAL_TABLET | Freq: Two times a day (BID) | ORAL | 1 refills | Status: DC

## 2023-03-16 MED ORDER — AMLODIPINE BESYLATE 5 MG PO TABS: 5.0000 mg | ORAL_TABLET | Freq: Every morning | ORAL | 1 refills | Status: DC

## 2023-03-16 NOTE — Progress Notes (Unsigned)
Subjective:  Patient ID: [name], female    DOB: 12-Jan-1951  Age: 72 y.o. MRN: [id number]  CC: Hypertension, Hypothyroidism, and Diabetes   HPI CHALINA DENIGRIS presents for f/up -  She is active and denies DOE, CP, SOB, edema.  Outpatient Medications Prior to Visit  Medication Sig Dispense Refill   blood glucose meter kit and supplies Dispense based on patient and insurance preference. Use up to two times daily as directed. (FOR ICD-10 E10.9, E11.9). 1 each 0   clonazePAM (KLONOPIN) 0.5 MG tablet Take 1 tablet (0.5 mg total) by mouth 2 (two) times daily as needed for anxiety. 180 tablet 1   clopidogrel (PLAVIX) 75 MG tablet Take 1 tablet (75 mg total) by mouth every morning. 90 tablet 3   famotidine (PEPCID) 40 MG tablet TAKE ONE TABLET BY MOUTH EVERY MORNING 90 tablet 1   glucose blood (CONTOUR NEXT TEST) test strip 1 each by Other route 2 (two) times daily. Use as instructed 200 each 1   isosorbide mononitrate (IMDUR) 30 MG 24 hr tablet Take 1 tablet (30 mg total) by mouth every morning. 90 tablet 3   levocetirizine (XYZAL) 5 MG tablet Take 1 tablet (5 mg total) by mouth every evening. 90 tablet 1   losartan (COZAAR) 25 MG tablet Take 1 tablet (25 mg total) by mouth every morning. 90 tablet 3   magnesium oxide (MAG-OX) 400 MG tablet Take 400 mg by mouth daily.     meloxicam (MOBIC) 15 MG tablet Take 1 tablet (15 mg total) by mouth daily. 30 tablet 0   nitroGLYCERIN (NITROSTAT) 0.4 MG SL tablet Place 1 tablet (0.4 mg total) under the tongue every 5 (five) minutes as needed for chest pain. 25 tablet 6   pantoprazole (PROTONIX) 20 MG tablet TAKE ONE TABLET BY MOUTH EVERY MORNING 90 tablet 1   rosuvastatin (CRESTOR) 10 MG tablet Take 1 tablet (10 mg total) by mouth at bedtime. 90 tablet 3   triamcinolone (NASACORT) 55 MCG/ACT AERO nasal inhaler Place 4 sprays into the nose daily. 50.7 mL 3   triamcinolone cream (KENALOG) 0.5 % Apply 1 Application topically 3 (three) times daily. 30 g 2    Vitamin D, Ergocalciferol, (DRISDOL) 1.25 MG (50000 UNIT) CAPS capsule Take 1 capsule (50,000 Units total) by mouth every 7 (seven) days. 12 capsule 0   amLODipine (NORVASC) 5 MG tablet TAKE ONE TABLET BY MOUTH EVERY MORNING 90 tablet 3   carvedilol (COREG) 6.25 MG tablet TAKE ONE TABLET BY MOUTH EVERY MORNING and TAKE ONE TABLET BY MOUTH EVERYDAY AT BEDTIME 180 tablet 3   levothyroxine (SYNTHROID) 100 MCG tablet TAKE ONE TABLET BY MOUTH BEFORE BREAKFAST 90 tablet 1   linaclotide (LINZESS) 145 MCG CAPS capsule Take 1 capsule (145 mcg total) by mouth daily before breakfast. 90 capsule 1   potassium chloride (KLOR-CON 10) 10 MEQ tablet Take 1 tablet (10 mEq total) by mouth 2 (two) times daily. 180 tablet 1   No facility-administered medications prior to visit.    ROS Review of Systems  Constitutional: Negative.  Negative for diaphoresis and fatigue.  HENT: Negative.    Eyes: Negative.   Respiratory:  Negative for cough, chest tightness, shortness of breath and wheezing.   Cardiovascular:  Negative for chest pain, palpitations and leg swelling.  Gastrointestinal:  Positive for constipation. Negative for abdominal pain, diarrhea, nausea and vomiting.  Endocrine: Negative.   Genitourinary: Negative.   Musculoskeletal: Negative.  Negative for arthralgias and myalgias.  Skin:  Negative.   Neurological: Negative.  Negative for dizziness and weakness.  Hematological:  Negative for adenopathy. Does not bruise/bleed easily.  Psychiatric/Behavioral: Negative.      Objective:  BP 136/84 (BP Location: Left Arm, Patient Position: Sitting, Cuff Size: Large)   Pulse 68   Temp 98 F (36.7 C) (Oral)   Ht  (1.702 m)   Wt 182 lb (82.6 kg)   SpO2 97%   BMI 28.51 kg/m   BP Readings from Last 3 Encounters:  03/16/23 136/84  01/03/23 134/80  12/01/22 132/82    Wt Readings from Last 3 Encounters:  03/16/23 182 lb (82.6 kg)  01/18/23 188 lb (85.3 kg)  01/03/23 188 lb (85.3 kg)     Physical Exam Vitals reviewed.  Constitutional:      Appearance: Normal appearance.  HENT:     Mouth/Throat:     Mouth: Mucous membranes are moist.  Eyes:     General: No scleral icterus.    Conjunctiva/sclera: Conjunctivae normal.  Cardiovascular:     Rate and Rhythm: Normal rate and regular rhythm.     Heart sounds: No murmur heard. Pulmonary:     Effort: Pulmonary effort is normal.     Breath sounds: No stridor. No wheezing, rhonchi or rales.  Abdominal:     General: Abdomen is flat.     Palpations: There is no mass.     Tenderness: There is no abdominal tenderness. There is no guarding.     Hernia: No hernia is present.  Musculoskeletal:     Cervical back: Neck supple.  Lymphadenopathy:     Cervical: No cervical adenopathy.  Skin:    General: Skin is warm and dry.     Findings: No lesion.  Neurological:     General: No focal deficit present.     Mental Status: She is alert. Mental status is at baseline.  Psychiatric:        Mood and Affect: Mood normal.        Behavior: Behavior normal.     Lab Results  Component Value Date   WBC 8.2 09/23/2022   HGB 12.5 09/23/2022   HCT 37.7 09/23/2022   PLT 291.0 09/23/2022   GLUCOSE 105 (H) 03/16/2023   CHOL 97 01/03/2023   TRIG 80.0 01/03/2023   HDL 41.30 01/03/2023   LDLDIRECT 88.0 10/19/2016   LDLCALC 40 01/03/2023   ALT 8 09/23/2022   AST 14 09/23/2022   NA 140 03/16/2023   K 3.7 03/16/2023   CL 105 03/16/2023   CREATININE 0.82 03/16/2023   BUN 11 03/16/2023   CO2 29 03/16/2023   TSH 4.08 01/03/2023   INR 1.01 05/08/2018   HGBA1C 5.8 (A) 03/16/2023   MICROALBUR <0.7 09/14/2022    MR BRAIN/IAC W WO CONTRAST  Result Date: 01/24/2023 CLINICAL DATA:  Provided history: Sensorineural hearing loss, asymmetrical. Left-sided tinnitus. Tinnitus aurium, left. EXAM: MRI HEAD WITHOUT AND WITH CONTRAST TECHNIQUE: Multiplanar, multiecho pulse sequences of the brain and surrounding structures were obtained without  and with intravenous contrast. CONTRAST:  9 mL Vueway intravenous contrast. COMPARISON:  Head CT 08/04/2019. FINDINGS: Brain: No age advanced or lobar predominant parenchymal atrophy. Minimal multifocal T2 FLAIR hyperintense signal abnormality within the cerebral white matter, nonspecific but compatible with chronic small vessel ischemic disease. No evidence of an intracranial mass. Specifically, no cerebellopontine angle or internal auditory canal mass is demonstrated. Unremarkable appearance of the 7th and 8th cranial nerves bilaterally. There is no acute infarct. Small foci of diffusion-weighted  signal hyperintensity within the mid to posterior left frontal lobe white matter (without ADC correlate). No chronic intracranial blood products. No extra-axial fluid collection. No midline shift. No pathologic intracranial enhancement identified. Vascular: Maintained flow voids within the proximal large arterial vessels. Skull and upper cervical spine: No focal suspicious marrow lesion. Susceptibility artifact arising from ACDF hardware. Sinuses/Orbits: No mass or acute finding within the imaged orbits. Prior bilateral ocular lens replacement. Small mucous retention cyst within the right maxillary sinus. 2.8 cm mucous retention cyst within the left maxillary sinus IMPRESSION: 1.  No evidence of an acute intracranial abnormality. 2. No cerebellopontine angle or internal auditory canal mass. 3. No specific cause of hearing loss or tinnitus is identified. 4. Minimal chronic small vessel ischemic changes within the cerebral white matter 5. Mucous retention cysts within the bilateral maxillary sinuses measuring up to 2.8 cm. Electronically Signed   By: Jackey Loge D.O.   On: 01/24/2023 08:45    Assessment & Plan:   Chronic hypokalemia -     Potassium Chloride ER; Take 1 tablet (10 mEq total) by mouth 2 (two) times daily.  Dispense: 180 tablet; Refill: 1 -     Basic metabolic panel; Future  Chronic idiopathic  constipation -     linaCLOtide; Take 1 capsule (145 mcg total) by mouth daily before breakfast.  Dispense: 90 capsule; Refill: 1  Essential hypertension- Her BP is well controlled. -     amLODIPine Besylate; Take 1 tablet (5 mg total) by mouth every morning.  Dispense: 90 tablet; Refill: 1 -     Carvedilol; Take 1 tablet (6.25 mg total) by mouth 2 (two) times daily with a meal.  Dispense: 180 tablet; Refill: 1 -     Potassium Chloride ER; Take 1 tablet (10 mEq total) by mouth 2 (two) times daily.  Dispense: 180 tablet; Refill: 1 -     Basic metabolic panel; Future  Type II diabetes mellitus with manifestations- Her blood sugar is very well controlled. -     Basic metabolic panel; Future -     POCT glycosylated hemoglobin (Hb A1C)  Coronary artery disease due to calcified coronary lesion -     amLODIPine Besylate; Take 1 tablet (5 mg total) by mouth every morning.  Dispense: 90 tablet; Refill: 1 -     Carvedilol; Take 1 tablet (6.25 mg total) by mouth 2 (two) times daily with a meal.  Dispense: 180 tablet; Refill: 1  Acquired hypothyroidism- She is euthyroid. -     Levothyroxine Sodium; Take 1 tablet (100 mcg total) by mouth daily before breakfast.  Dispense: 90 tablet; Refill: 1     Follow-up: Return in about 4 months (around 07/16/2023).  Sanda Linger, MD

## 2023-03-16 NOTE — Patient Instructions (Signed)

## 2023-05-11 DIAGNOSIS — E663 Overweight: Secondary | ICD-10-CM | POA: Diagnosis not present

## 2023-05-11 DIAGNOSIS — M1991 Primary osteoarthritis, unspecified site: Secondary | ICD-10-CM | POA: Diagnosis not present

## 2023-05-11 DIAGNOSIS — Z6827 Body mass index (BMI) 27.0-27.9, adult: Secondary | ICD-10-CM | POA: Diagnosis not present

## 2023-05-11 DIAGNOSIS — R768 Other specified abnormal immunological findings in serum: Secondary | ICD-10-CM | POA: Diagnosis not present

## 2023-07-05 ENCOUNTER — Encounter: Payer: Self-pay | Admitting: Internal Medicine

## 2023-07-05 ENCOUNTER — Ambulatory Visit: Payer: HMO | Admitting: Internal Medicine

## 2023-07-05 VITALS — BP 132/72 | HR 70 | Temp 98.4°F | Resp 16 | Ht 67.0 in | Wt 180.0 lb

## 2023-07-05 DIAGNOSIS — E039 Hypothyroidism, unspecified: Secondary | ICD-10-CM

## 2023-07-05 DIAGNOSIS — E118 Type 2 diabetes mellitus with unspecified complications: Secondary | ICD-10-CM

## 2023-07-05 DIAGNOSIS — K5904 Chronic idiopathic constipation: Secondary | ICD-10-CM

## 2023-07-05 DIAGNOSIS — E876 Hypokalemia: Secondary | ICD-10-CM | POA: Diagnosis not present

## 2023-07-05 DIAGNOSIS — I10 Essential (primary) hypertension: Secondary | ICD-10-CM | POA: Diagnosis not present

## 2023-07-05 DIAGNOSIS — N951 Menopausal and female climacteric states: Secondary | ICD-10-CM | POA: Insufficient documentation

## 2023-07-05 LAB — TSH: TSH: 1.91 u[IU]/mL (ref 0.35–5.50)

## 2023-07-05 LAB — BASIC METABOLIC PANEL
BUN: 11 mg/dL (ref 6–23)
CO2: 27 mEq/L (ref 19–32)
Calcium: 9.5 mg/dL (ref 8.4–10.5)
Chloride: 105 mEq/L (ref 96–112)
Creatinine, Ser: 0.82 mg/dL (ref 0.40–1.20)
GFR: 71.62 mL/min (ref >60.00)
Glucose, Bld: 99 mg/dL (ref 70–99)
Potassium: 3.7 mEq/L (ref 3.5–5.1)
Sodium: 140 mEq/L (ref 135–145)

## 2023-07-05 LAB — CBC WITH DIFFERENTIAL/PLATELET
Basophils Absolute: 0.1 10*3/uL (ref 0.0–0.1)
Basophils Relative: 1 % (ref 0.0–3.0)
Eosinophils Absolute: 0.2 10*3/uL (ref 0.0–0.7)
Eosinophils Relative: 2.9 % (ref 0.0–5.0)
HCT: 38.6 % (ref 36.0–46.0)
Hemoglobin: 12.5 g/dL (ref 12.0–15.0)
Lymphocytes Relative: 34.3 % (ref 12.0–46.0)
Lymphs Abs: 2.1 10*3/uL (ref 0.7–4.0)
MCHC: 32.4 g/dL (ref 30.0–36.0)
MCV: 81.7 fl (ref 78.0–100.0)
Monocytes Absolute: 0.4 10*3/uL (ref 0.1–1.0)
Monocytes Relative: 6.8 % (ref 3.0–12.0)
Neutro Abs: 3.3 10*3/uL (ref 1.4–7.7)
Neutrophils Relative %: 55 % (ref 43.0–77.0)
Platelets: 273 10*3/uL (ref 150.0–400.0)
RBC: 4.72 Mil/uL (ref 3.87–5.11)
RDW: 14.6 % (ref 11.5–15.5)
WBC: 6 10*3/uL (ref 4.0–10.5)

## 2023-07-05 LAB — HEPATIC FUNCTION PANEL
ALT: 23 U/L (ref 0–35)
AST: 30 U/L (ref 0–37)
Albumin: 4.1 g/dL (ref 3.5–5.2)
Alkaline Phosphatase: 81 U/L (ref 39–117)
Bilirubin, Direct: 0.3 mg/dL (ref 0.0–0.3)
Total Bilirubin: 1.6 mg/dL — ABNORMAL HIGH (ref 0.2–1.2)
Total Protein: 7.2 g/dL (ref 6.0–8.3)

## 2023-07-05 LAB — HEMOGLOBIN A1C: Hgb A1c MFr Bld: 6.4 % (ref 4.6–6.5)

## 2023-07-05 NOTE — Progress Notes (Signed)
Subjective:  Patient ID: [name], female    DOB: 1951/02/23  Age: 72 y.o. MRN: [id number]  CC: Hypertension and Hypothyroidism   HPI [name] presents for f/up ----  Discussed the use of AI scribe software for clinical note transcription with the patient, who gave verbal consent to proceed.  History of Present Illness   The patient, with a history of thyroid disease, presents with no significant complaints. They deny experiencing chest pain, shortness of breath, headaches, blurred vision, dizziness, or lightheadedness. They report occasional emotional outbursts, which they associate with their thyroid condition. They also mention a persistent issue with constipation. Despite these minor issues, the patient maintains an active lifestyle, walking approximately a mile regularly with good endurance. They have experienced some weight loss, dropping to 170 pounds.       Outpatient Medications Prior to Visit  Medication Sig Dispense Refill   amLODipine (NORVASC) 5 MG tablet Take 1 tablet (5 mg total) by mouth every morning. 90 tablet 1   blood glucose meter kit and supplies Dispense based on patient and insurance preference. Use up to two times daily as directed. (FOR ICD-10 E10.9, E11.9). 1 each 0   carvedilol (COREG) 6.25 MG tablet Take 1 tablet (6.25 mg total) by mouth 2 (two) times daily with a meal. 180 tablet 1   clonazePAM (KLONOPIN) 0.5 MG tablet Take 1 tablet (0.5 mg total) by mouth 2 (two) times daily as needed for anxiety. 180 tablet 1   clopidogrel (PLAVIX) 75 MG tablet Take 1 tablet (75 mg total) by mouth every morning. 90 tablet 3   famotidine (PEPCID) 40 MG tablet TAKE ONE TABLET BY MOUTH EVERY MORNING 90 tablet 1   glucose blood (CONTOUR NEXT TEST) test strip 1 each by Other route 2 (two) times daily. Use as instructed 200 each 1   isosorbide mononitrate (IMDUR) 30 MG 24 hr tablet Take 1 tablet (30 mg total) by mouth every morning. 90 tablet 3   levocetirizine (XYZAL) 5  MG tablet Take 1 tablet (5 mg total) by mouth every evening. 90 tablet 1   levothyroxine (SYNTHROID) 100 MCG tablet Take 1 tablet (100 mcg total) by mouth daily before breakfast. 90 tablet 1   linaclotide (LINZESS) 145 MCG CAPS capsule Take 1 capsule (145 mcg total) by mouth daily before breakfast. 90 capsule 1   losartan (COZAAR) 25 MG tablet Take 1 tablet (25 mg total) by mouth every morning. 90 tablet 3   magnesium oxide (MAG-OX) 400 MG tablet Take 400 mg by mouth daily.     meloxicam (MOBIC) 15 MG tablet Take 1 tablet (15 mg total) by mouth daily. 30 tablet 0   nitroGLYCERIN (NITROSTAT) 0.4 MG SL tablet Place 1 tablet (0.4 mg total) under the tongue every 5 (five) minutes as needed for chest pain. 25 tablet 6   pantoprazole (PROTONIX) 20 MG tablet TAKE ONE TABLET BY MOUTH EVERY MORNING 90 tablet 1   potassium chloride (KLOR-CON 10) 10 MEQ tablet Take 1 tablet (10 mEq total) by mouth 2 (two) times daily. 180 tablet 1   rosuvastatin (CRESTOR) 10 MG tablet Take 1 tablet (10 mg total) by mouth at bedtime. 90 tablet 3   triamcinolone (NASACORT) 55 MCG/ACT AERO nasal inhaler Place 4 sprays into the nose daily. 50.7 mL 3   triamcinolone cream (KENALOG) 0.5 % Apply 1 Application topically 3 (three) times daily. 30 g 2   Vitamin D, Ergocalciferol, (DRISDOL) 1.25 MG (50000 UNIT) CAPS capsule Take 1 capsule (  50,000 Units total) by mouth every 7 (seven) days. 12 capsule 0   No facility-administered medications prior to visit.    ROS Review of Systems  Constitutional: Negative.  Negative for chills, diaphoresis, fatigue and fever.  HENT: Negative.    Eyes: Negative.   Respiratory:  Negative for cough, chest tightness, shortness of breath and wheezing.   Cardiovascular:  Negative for chest pain, palpitations and leg swelling.  Gastrointestinal:  Positive for constipation. Negative for abdominal pain, diarrhea, nausea and vomiting.  Endocrine: Negative.  Negative for cold intolerance and heat  intolerance.  Genitourinary: Negative.  Negative for difficulty urinating.  Musculoskeletal: Negative.  Negative for arthralgias, joint swelling and myalgias.  Skin:  Negative for color change and pallor.  Neurological:  Negative for dizziness, weakness and headaches.  Hematological:  Negative for adenopathy. Does not bruise/bleed easily.  Psychiatric/Behavioral: Negative.      Objective:  BP 132/72 (BP Location: Left Arm, Patient Position: Sitting, Cuff Size: Large)   Pulse 70   Temp 98.4 F (36.9 C) (Oral)   Resp 16   Ht 5\' 7"  (1.702 m)   Wt 180 lb (81.6 kg)   SpO2 96%   BMI 28.19 kg/m   BP Readings from Last 3 Encounters:  07/05/23 132/72  03/16/23 136/84  01/03/23 134/80    Wt Readings from Last 3 Encounters:  07/05/23 180 lb (81.6 kg)  03/16/23 182 lb (82.6 kg)  01/18/23 188 lb (85.3 kg)    Physical Exam Vitals reviewed.  Constitutional:      Appearance: Normal appearance.  HENT:     Nose: Nose normal.     Mouth/Throat:     Mouth: Mucous membranes are moist.  Eyes:     General: No scleral icterus.    Conjunctiva/sclera: Conjunctivae normal.  Cardiovascular:     Rate and Rhythm: Normal rate and regular rhythm.     Pulses: Normal pulses.     Heart sounds: No murmur heard.    No friction rub. No gallop.  Pulmonary:     Effort: Pulmonary effort is normal.     Breath sounds: No stridor. No wheezing or rhonchi.  Abdominal:     General: Abdomen is flat.     Palpations: There is no mass.     Tenderness: There is no abdominal tenderness. There is no guarding.     Hernia: No hernia is present.  Musculoskeletal:        General: Normal range of motion.     Cervical back: Neck supple.     Right lower leg: No edema.     Left lower leg: No edema.  Lymphadenopathy:     Cervical: No cervical adenopathy.  Skin:    General: Skin is warm and dry.  Neurological:     General: No focal deficit present.     Mental Status: She is alert. Mental status is at baseline.   Psychiatric:        Mood and Affect: Mood normal.        Behavior: Behavior normal.     Lab Results  Component Value Date   WBC 6.0 07/05/2023   HGB 12.5 07/05/2023   HCT 38.6 07/05/2023   PLT 273.0 07/05/2023   GLUCOSE 99 07/05/2023   CHOL 97 01/03/2023   TRIG 80.0 01/03/2023   HDL 41.30 01/03/2023   LDLDIRECT 88.0 10/19/2016   LDLCALC 40 01/03/2023   ALT 23 07/05/2023   AST 30 07/05/2023   NA 140 07/05/2023   K 3.7 07/05/2023  CL 105 07/05/2023   CREATININE 0.82 07/05/2023   BUN 11 07/05/2023   CO2 27 07/05/2023   TSH 1.91 07/05/2023   INR 1.01 05/08/2018   HGBA1C 6.4 07/05/2023   MICROALBUR <0.7 09/14/2022    MR BRAIN/IAC W WO CONTRAST  Result Date: 01/24/2023 CLINICAL DATA:  Provided history: Sensorineural hearing loss, asymmetrical. Left-sided tinnitus. Tinnitus aurium, left. EXAM: MRI HEAD WITHOUT AND WITH CONTRAST TECHNIQUE: Multiplanar, multiecho pulse sequences of the brain and surrounding structures were obtained without and with intravenous contrast. CONTRAST:  9 mL Vueway intravenous contrast. COMPARISON:  Head CT 08/04/2019. FINDINGS: Brain: No age advanced or lobar predominant parenchymal atrophy. Minimal multifocal T2 FLAIR hyperintense signal abnormality within the cerebral white matter, nonspecific but compatible with chronic small vessel ischemic disease. No evidence of an intracranial mass. Specifically, no cerebellopontine angle or internal auditory canal mass is demonstrated. Unremarkable appearance of the 7th and 8th cranial nerves bilaterally. There is no acute infarct. Small foci of diffusion-weighted signal hyperintensity within the mid to posterior left frontal lobe white matter (without ADC correlate). No chronic intracranial blood products. No extra-axial fluid collection. No midline shift. No pathologic intracranial enhancement identified. Vascular: Maintained flow voids within the proximal large arterial vessels. Skull and upper cervical spine: No  focal suspicious marrow lesion. Susceptibility artifact arising from ACDF hardware. Sinuses/Orbits: No mass or acute finding within the imaged orbits. Prior bilateral ocular lens replacement. Small mucous retention cyst within the right maxillary sinus. 2.8 cm mucous retention cyst within the left maxillary sinus IMPRESSION: 1.  No evidence of an acute intracranial abnormality. 2. No cerebellopontine angle or internal auditory canal mass. 3. No specific cause of hearing loss or tinnitus is identified. 4. Minimal chronic small vessel ischemic changes within the cerebral white matter 5. Mucous retention cysts within the bilateral maxillary sinuses measuring up to 2.8 cm. Electronically Signed   By: Jackey Loge D.O.   On: 01/24/2023 08:45    Assessment & Plan:   Essential hypertension- Her blood pressure is adequately well-controlled. -     Basic metabolic panel; Future -     CBC with Differential/Platelet; Future -     TSH; Future -     Hepatic function panel; Future  Chronic hypokalemia -     Basic metabolic panel; Future -     Hepatic function panel; Future  Chronic idiopathic constipation -     TSH; Future -     Hepatic function panel; Future  Type II diabetes mellitus with manifestations (HCC)- Her blood sugar is well-controlled. -     Hepatic function panel; Future -     Hemoglobin A1c; Future  Acquired hypothyroidism- She is euthyroid. -     TSH; Future -     Hepatic function panel; Future      Follow-up: Return in about 6 months (around 01/05/2024).  Sanda Linger, MD

## 2023-07-05 NOTE — Patient Instructions (Signed)

## 2023-07-11 ENCOUNTER — Other Ambulatory Visit: Payer: Self-pay | Admitting: Internal Medicine

## 2023-07-11 DIAGNOSIS — Z1231 Encounter for screening mammogram for malignant neoplasm of breast: Secondary | ICD-10-CM

## 2023-07-18 ENCOUNTER — Ambulatory Visit: Payer: HMO | Admitting: Internal Medicine

## 2023-07-26 ENCOUNTER — Encounter: Payer: Self-pay | Admitting: Cardiovascular Disease

## 2023-07-26 ENCOUNTER — Ambulatory Visit: Payer: HMO | Attending: Cardiovascular Disease | Admitting: Cardiovascular Disease

## 2023-07-26 VITALS — BP 134/82 | HR 57 | Ht 67.0 in | Wt 179.2 lb

## 2023-07-26 DIAGNOSIS — I2510 Atherosclerotic heart disease of native coronary artery without angina pectoris: Secondary | ICD-10-CM | POA: Diagnosis not present

## 2023-07-26 DIAGNOSIS — I5181 Takotsubo syndrome: Secondary | ICD-10-CM

## 2023-07-26 DIAGNOSIS — I2584 Coronary atherosclerosis due to calcified coronary lesion: Secondary | ICD-10-CM | POA: Diagnosis not present

## 2023-07-26 DIAGNOSIS — E782 Mixed hyperlipidemia: Secondary | ICD-10-CM | POA: Diagnosis not present

## 2023-07-26 DIAGNOSIS — I10 Essential (primary) hypertension: Secondary | ICD-10-CM | POA: Diagnosis not present

## 2023-07-26 MED ORDER — POTASSIUM CHLORIDE ER 10 MEQ PO TBCR: 10.0000 meq | EXTENDED_RELEASE_TABLET | Freq: Two times a day (BID) | ORAL | 3 refills | Status: DC

## 2023-07-26 MED ORDER — ISOSORBIDE MONONITRATE ER 30 MG PO TB24: 30.0000 mg | ORAL_TABLET | Freq: Every morning | ORAL | 3 refills | Status: DC

## 2023-07-26 MED ORDER — LOSARTAN POTASSIUM 25 MG PO TABS: 25.0000 mg | ORAL_TABLET | Freq: Every morning | ORAL | 3 refills | Status: DC

## 2023-07-26 MED ORDER — ROSUVASTATIN CALCIUM 10 MG PO TABS: 10.0000 mg | ORAL_TABLET | Freq: Every day | ORAL | 3 refills | Status: DC

## 2023-07-26 MED ORDER — AMLODIPINE BESYLATE 5 MG PO TABS: 5.0000 mg | ORAL_TABLET | Freq: Every morning | ORAL | 3 refills | Status: DC

## 2023-07-26 MED ORDER — CARVEDILOL 6.25 MG PO TABS: 6.2500 mg | ORAL_TABLET | Freq: Two times a day (BID) | ORAL | 3 refills | Status: DC

## 2023-07-26 MED ORDER — CLOPIDOGREL BISULFATE 75 MG PO TABS: 75.0000 mg | ORAL_TABLET | Freq: Every morning | ORAL | 3 refills | Status: DC

## 2023-07-26 NOTE — Patient Instructions (Signed)

## 2023-07-26 NOTE — Progress Notes (Signed)
Cardiology Office Note:    Date:  07/26/2023   ID:  [name], DOB 1951/10/02, MRN [id number]  PCP:  Etta Grandchild, MD   [facility] Providers Cardiologist:  Tonny Bollman, MD     Referring MD: Etta Grandchild, MD   Chief Complaint  Patient presents with   Follow-up    Takotsubo Cardiomyopathy    History of Present Illness:    [name] is a 72 y.o. female with a hx of Takotsubo cardiomyopathy, presenting today for follow-up evaluation.  The patient presented in 2019 with acute Takotsubo syndrome, LVEF 45% at the time.  Follow-up echo demonstrated normalization of LV function with an LVEF in the range of 70 to 75%.  The patient has also been followed for hypertension and hypertensive heart disease.   The patient is here alone today.  She is doing well with no complaints of chest pain, chest pressure, or shortness of breath.  Last week she walked outdoors 3 days with no exertional symptoms.  She is compliant with her medications.  She reports no lightheadedness, heart palpitations, edema, orthopnea, or PND.  Past Medical History:  Diagnosis Date   Anemia    Arthritis    Cardiomyopathy (HCC)    Cholelithiasis    Chronic back pain    Constipation    Esophageal reflux    Fibromyalgia    Hemorrhoids    Hyperlipidemia    Hypertension    Hypothyroid    IBS (irritable bowel syndrome)    Internal hemorrhoids without mention of complication    Left leg pain    Myocardial infarction [facility])    Personal history of colonic polyps 05/06/2010   ADENOMATOUS POLYP   Pneumonia    Tubular adenoma of colon     Past Surgical History:  Procedure Laterality Date   ABDOMINAL HYSTERECTOMY     CERVICAL DISC SURGERY     PLATE IN NECK & BACK   CHOLECYSTECTOMY     COLONOSCOPY  09/27/2011   NORMAL    COLONOSCOPY WITH PROPOFOL N/A 10/06/2020   Procedure: COLONOSCOPY WITH PROPOFOL;  Surgeon: Lemar Lofty., MD;  Location: [facility] ENDOSCOPY;  Service: Gastroenterology;   Laterality: N/A;  ultra slim scope    LEFT HEART CATH AND CORONARY ANGIOGRAPHY N/A 05/08/2018   Procedure: LEFT HEART CATH AND CORONARY ANGIOGRAPHY;  Surgeon: Lyn Records, MD;  Location: [facility] INVASIVE CV LAB;  Service: Cardiovascular;  Laterality: N/A;   LUMBAR DISC SURGERY     POLYPECTOMY  10/06/2020   Procedure: POLYPECTOMY;  Surgeon: Lemar Lofty., MD;  Location: [facility] ENDOSCOPY;  Service: Gastroenterology;;   TONSILLECTOMY      Current Medications: Current Meds  Medication Sig   clonazePAM (KLONOPIN) 0.5 MG tablet Take 1 tablet (0.5 mg total) by mouth 2 (two) times daily as needed for anxiety.   famotidine (PEPCID) 40 MG tablet TAKE ONE TABLET BY MOUTH EVERY MORNING   levocetirizine (XYZAL) 5 MG tablet Take 1 tablet (5 mg total) by mouth every evening.   levothyroxine (SYNTHROID) 100 MCG tablet Take 1 tablet (100 mcg total) by mouth daily before breakfast.   linaclotide (LINZESS) 145 MCG CAPS capsule Take 1 capsule (145 mcg total) by mouth daily before breakfast.   magnesium oxide (MAG-OX) 400 MG tablet Take 400 mg by mouth daily.   meloxicam (MOBIC) 15 MG tablet Take 1 tablet (15 mg total) by mouth daily.   nitroGLYCERIN (NITROSTAT) 0.4 MG SL tablet Place 1 tablet (0.4 mg total) under  the tongue every 5 (five) minutes as needed for chest pain.   pantoprazole (PROTONIX) 20 MG tablet TAKE ONE TABLET BY MOUTH EVERY MORNING   Vitamin D, Ergocalciferol, (DRISDOL) 1.25 MG (50000 UNIT) CAPS capsule Take 1 capsule (50,000 Units total) by mouth every 7 (seven) days.   [DISCONTINUED] amLODipine (NORVASC) 5 MG tablet Take 1 tablet (5 mg total) by mouth every morning.   [DISCONTINUED] blood glucose meter kit and supplies Dispense based on patient and insurance preference. Use up to two times daily as directed. (FOR ICD-10 E10.9, E11.9).   [DISCONTINUED] carvedilol (COREG) 6.25 MG tablet Take 1 tablet (6.25 mg total) by mouth 2 (two) times daily with a meal.   [DISCONTINUED] clopidogrel  (PLAVIX) 75 MG tablet Take 1 tablet (75 mg total) by mouth every morning.   [DISCONTINUED] glucose blood (CONTOUR NEXT TEST) test strip 1 each by Other route 2 (two) times daily. Use as instructed   [DISCONTINUED] isosorbide mononitrate (IMDUR) 30 MG 24 hr tablet Take 1 tablet (30 mg total) by mouth every morning.   [DISCONTINUED] losartan (COZAAR) 25 MG tablet Take 1 tablet (25 mg total) by mouth every morning.   [DISCONTINUED] potassium chloride (KLOR-CON 10) 10 MEQ tablet Take 1 tablet (10 mEq total) by mouth 2 (two) times daily.   [DISCONTINUED] rosuvastatin (CRESTOR) 10 MG tablet Take 1 tablet (10 mg total) by mouth at bedtime.   [DISCONTINUED] triamcinolone (NASACORT) 55 MCG/ACT AERO nasal inhaler Place 4 sprays into the nose daily.   [DISCONTINUED] triamcinolone cream (KENALOG) 0.5 % Apply 1 Application topically 3 (three) times daily.     Allergies:   Aspirin, Gadolinium, Iohexol, Livalo [pitavastatin], Hydrocodone-acetaminophen, and Lipitor [atorvastatin]   Social History   Socioeconomic History   Marital status: Widowed    Spouse name: Not on file   Number of children: 2   Years of education: Not on file   Highest education level: Not on file  Occupational History   Occupation: housewife    Employer: UNEMPLOYED   Tobacco Use   Smoking status: Never    Passive exposure: Never   Smokeless tobacco: Never  Vaping Use   Vaping status: Never Used  Substance and Sexual Activity   Alcohol use: No    Alcohol/week: 0.0 standard drinks of alcohol   Drug use: No   Sexual activity: Not Currently  Other Topics Concern   Not on file  Social History Narrative   Daily Caffeine Use   Social Determinants of Health   Financial Resource Strain: Low Risk  (01/18/2023)   Overall Financial Resource Strain (CARDIA)    Difficulty of Paying Living Expenses: Not hard at all  Food Insecurity: No Food Insecurity (01/18/2023)   Hunger Vital Sign    Worried About Running Out of Food in the Last  Year: Never true    Ran Out of Food in the Last Year: Never true  Transportation Needs: No Transportation Needs (01/18/2023)   PRAPARE - Administrator, Civil Service (Medical): No    Lack of Transportation (Non-Medical): No  Physical Activity: Insufficiently Active (01/18/2023)   Exercise Vital Sign    Days of Exercise per Week: 3 days    Minutes of Exercise per Session: 30 min  Stress: No Stress Concern Present (01/18/2023)   [employer] of Occupational Health - Occupational Stress Questionnaire    Feeling of Stress : Not at all  Social Connections: Moderately Isolated (01/18/2023)   Social Connection and Isolation Panel [NHANES]    Frequency of  Communication with Friends and Family: More than three times a week    Frequency of Social Gatherings with Friends and Family: Three times a week    Attends Religious Services: More than 4 times per year    Active Member of Clubs or Organizations: No    Attends Banker Meetings: Never    Marital Status: Widowed     Family History: The patient's family history includes Breast cancer in her cousin and maternal aunt; COPD in her mother; Dementia in her mother; Diabetes in her mother; Heart failure in her father; Hypertension in her mother; Kidney disease in her mother; Lung disease in her mother. There is no history of Colon cancer, Esophageal cancer, Stomach cancer, or Rectal cancer.  ROS:   Please see the history of present illness.    All other systems reviewed and are negative.  EKGs/Labs/Other Studies Reviewed:    The following studies were reviewed today: Echo from 2020 is reviewed and shows full recovery of LVEF estimated at 70% with no regional wall motion abnormalities     EKG today shows sinus bradycardia with first-degree AV block, heart rate 57 bpm, otherwise normal.  Recent Labs: 07/05/2023: ALT 23; BUN 11; Creatinine, Ser 0.82; Hemoglobin 12.5; Platelets 273.0; Potassium 3.7; Sodium 140; TSH 1.91   Recent Lipid Panel    Component Value Date/Time   CHOL 97 01/03/2023 0841   CHOL 109 06/27/2018 0812   TRIG 80.0 01/03/2023 0841   HDL 41.30 01/03/2023 0841   HDL 43 06/27/2018 0812   CHOLHDL 2 01/03/2023 0841   VLDL 16.0 01/03/2023 0841   LDLCALC 40 01/03/2023 0841   LDLCALC 63 06/30/2020 0843   LDLDIRECT 88.0 10/19/2016 0844     Risk Assessment/Calculations:                Physical Exam:    VS:  BP 134/82   Pulse (!) 57   Ht 5\' 7"  (1.702 m)   Wt 179 lb 3.2 oz (81.3 kg)   SpO2 99%   BMI 28.07 kg/m     Wt Readings from Last 3 Encounters:  07/26/23 179 lb 3.2 oz (81.3 kg)  07/05/23 180 lb (81.6 kg)  03/16/23 182 lb (82.6 kg)     GEN:  Well nourished, well developed in no acute distress HEENT: Normal NECK: No JVD; No carotid bruits LYMPHATICS: No lymphadenopathy CARDIAC: RRR, no murmurs, rubs, gallops RESPIRATORY:  Clear to auscultation without rales, wheezing or rhonchi  ABDOMEN: Soft, non-tender, non-distended MUSCULOSKELETAL:  No edema; No deformity  SKIN: Warm and dry NEUROLOGIC:  Alert and oriented x 3 PSYCHIATRIC:  Normal affect   ASSESSMENT:    1. Essential hypertension   2. Coronary artery disease due to calcified coronary lesion   3. Takotsubo cardiomyopathy   4. Mixed hyperlipidemia    PLAN:    In order of problems listed above:  Blood pressure well-controlled on multidrug therapy with amlodipine, carvedilol, isosorbide, and losartan.  Reviewed labs which demonstrate a potassium of 3.7 and a creatinine of 0.82. Nonobstructive CAD noted in the past.  No anginal symptoms.  Managed with clopidogrel in the context of aspirin allergy.  Treated with a statin drug. Klor-Con prescription refilled.  Last potassium 3.7. LDL cholesterol 40, total cholesterol 97, HDL 41.  Continue rosuvastatin.      Medication Adjustments/Labs and Tests Ordered: Current medicines are reviewed at length with the patient today.  Concerns regarding medicines are  outlined above.  Orders Placed This Encounter  Procedures  EKG 12-Lead   EKG 12-Lead   Meds ordered this encounter  Medications   amLODipine (NORVASC) 5 MG tablet    Sig: Take 1 tablet (5 mg total) by mouth every morning.    Dispense:  90 tablet    Refill:  3    This prescription was filled on 05/13/2022. Any refills authorized will be placed on file.   carvedilol (COREG) 6.25 MG tablet    Sig: Take 1 tablet (6.25 mg total) by mouth 2 (two) times daily with a meal.    Dispense:  180 tablet    Refill:  3   clopidogrel (PLAVIX) 75 MG tablet    Sig: Take 1 tablet (75 mg total) by mouth every morning.    Dispense:  90 tablet    Refill:  3   isosorbide mononitrate (IMDUR) 30 MG 24 hr tablet    Sig: Take 1 tablet (30 mg total) by mouth every morning.    Dispense:  90 tablet    Refill:  3   losartan (COZAAR) 25 MG tablet    Sig: Take 1 tablet (25 mg total) by mouth every morning.    Dispense:  90 tablet    Refill:  3   potassium chloride (KLOR-CON 10) 10 MEQ tablet    Sig: Take 1 tablet (10 mEq total) by mouth 2 (two) times daily.    Dispense:  180 tablet    Refill:  3   rosuvastatin (CRESTOR) 10 MG tablet    Sig: Take 1 tablet (10 mg total) by mouth at bedtime.    Dispense:  90 tablet    Refill:  3    Patient Instructions  Medication Instructions:  Your physician recommends that you continue on your current medications as directed. Please refer to the Current Medication list given to you today.  *If you need a refill on your cardiac medications before your next appointment, please call your pharmacy*   Lab Work: NONE If you have labs (blood work) drawn today and your tests are completely normal, you will receive your results only by: MyChart Message (if you have MyChart) OR A paper copy in the mail If you have any lab test that is abnormal or we need to change your treatment, we will call you to review the results.   Testing/Procedures: NONE   Follow-Up: At [facility], you and your health needs are our priority.  As part of our continuing mission to provide you with exceptional heart care, we have created designated Provider Care Teams.  These Care Teams include your primary Cardiologist (physician) and Advanced Practice Providers (APPs -  Physician Assistants and Nurse Practitioners) who all work together to provide you with the care you need, when you need it.  We recommend signing up for the patient portal called "MyChart".  Sign up information is provided on this After Visit Summary.  MyChart is used to connect with patients for Virtual Visits (Telemedicine).  Patients are able to view lab/test results, encounter notes, upcoming appointments, etc.  Non-urgent messages can be sent to your provider as well.   To learn more about what you can do with MyChart, go to [email or web address].    Your next appointment:   1 year(s)  Provider:   Tonny Bollman, MD        Signed, Tonny Bollman, MD  07/26/2023 1:16 PM    [facility]

## 2023-07-29 ENCOUNTER — Other Ambulatory Visit: Payer: Self-pay | Admitting: Internal Medicine

## 2023-07-29 DIAGNOSIS — E559 Vitamin D deficiency, unspecified: Secondary | ICD-10-CM

## 2023-08-05 ENCOUNTER — Other Ambulatory Visit: Payer: Self-pay | Admitting: Internal Medicine

## 2023-08-05 DIAGNOSIS — K219 Gastro-esophageal reflux disease without esophagitis: Secondary | ICD-10-CM

## 2023-08-05 DIAGNOSIS — K2100 Gastro-esophageal reflux disease with esophagitis, without bleeding: Secondary | ICD-10-CM

## 2023-08-15 ENCOUNTER — Other Ambulatory Visit: Payer: Self-pay | Admitting: Internal Medicine

## 2023-08-15 DIAGNOSIS — J302 Other seasonal allergic rhinitis: Secondary | ICD-10-CM

## 2023-09-05 ENCOUNTER — Ambulatory Visit
Admission: RE | Admit: 2023-09-05 | Discharge: 2023-09-05 | Disposition: A | Discharge status: Home / Self Care | Payer: HMO | Source: Ambulatory Visit | Attending: Internal Medicine | Admitting: Internal Medicine

## 2023-09-05 DIAGNOSIS — Z1231 Encounter for screening mammogram for malignant neoplasm of breast: Secondary | ICD-10-CM | POA: Diagnosis not present

## 2023-11-24 ENCOUNTER — Other Ambulatory Visit: Payer: Self-pay | Admitting: Internal Medicine

## 2023-11-24 DIAGNOSIS — E559 Vitamin D deficiency, unspecified: Secondary | ICD-10-CM

## 2023-12-01 DIAGNOSIS — M5440 Lumbago with sciatica, unspecified side: Secondary | ICD-10-CM | POA: Diagnosis not present

## 2023-12-01 DIAGNOSIS — Z6828 Body mass index (BMI) 28.0-28.9, adult: Secondary | ICD-10-CM | POA: Diagnosis not present

## 2023-12-15 ENCOUNTER — Other Ambulatory Visit: Payer: Self-pay | Admitting: Internal Medicine

## 2023-12-15 DIAGNOSIS — E559 Vitamin D deficiency, unspecified: Secondary | ICD-10-CM

## 2023-12-23 ENCOUNTER — Ambulatory Visit (INDEPENDENT_AMBULATORY_CARE_PROVIDER_SITE_OTHER): Payer: HMO | Admitting: Family Medicine

## 2023-12-23 ENCOUNTER — Other Ambulatory Visit: Payer: Self-pay

## 2023-12-23 ENCOUNTER — Ambulatory Visit (INDEPENDENT_AMBULATORY_CARE_PROVIDER_SITE_OTHER): Payer: HMO

## 2023-12-23 VITALS — BP 144/82 | HR 79 | Ht 67.0 in | Wt 185.0 lb

## 2023-12-23 DIAGNOSIS — G8929 Other chronic pain: Secondary | ICD-10-CM

## 2023-12-23 DIAGNOSIS — M1712 Unilateral primary osteoarthritis, left knee: Secondary | ICD-10-CM

## 2023-12-23 DIAGNOSIS — M25562 Pain in left knee: Secondary | ICD-10-CM | POA: Diagnosis not present

## 2023-12-23 DIAGNOSIS — M25462 Effusion, left knee: Secondary | ICD-10-CM | POA: Diagnosis not present

## 2023-12-23 NOTE — Patient Instructions (Addendum)
Thank you for coming in today.  Please get an Xray today before you leave  You received an injection today. Seek immediate medical attention if the joint becomes red, extremely painful, or is oozing fluid.   Please use Voltaren gel (Generic Diclofenac Gel) up to 4x daily for pain as needed.  This is available over-the-counter as both the name brand Voltaren gel and the generic diclofenac gel.   Check back as needed

## 2023-12-23 NOTE — Progress Notes (Signed)
Rubin Payor, PhD, LAT, ATC acting as a scribe for Clementeen Graham, MD.  [name] is a 73 y.o. female who presents to [facility] at [facility] today for L knee pain. Pt was last seen by Dr. Jean Rosenthal on 12/01/22 for L leg pain.  Today, pt c/o L knee pain ongoing since Wednesday. No injury. Pt locates pain to the anterior-medial aspect of her L knee.  L Knee swelling: yes Mechanical symptoms: Radiates: yes- though thigh Aggravates: walking Treatments tried: naproxen, Tylenol, Voltaren gel   Dx imaging: 12/24/21 L knee XR  05/19/20 L knee XR  Pertinent review of systems: No fevers or chills  Relevant historical information: Medial knee osteoarthritis seen on prior x-ray left knee.   Exam:  BP (!) 144/82   Pulse 79   Ht 5\' 7"  (1.702 m)   Wt 185 lb (83.9 kg)   SpO2 95%   BMI 28.98 kg/m  General: Well Developed, well nourished, and in no acute distress.   MSK: Left knee mild effusion.  Normal-appearing otherwise. Normal motion. Tender palpation medial joint line. Intact strength. Antalgic gait.    Lab and Radiology Results  Procedure: Real-time Ultrasound Guided Injection of left knee joint superior lateral patella space Device: Philips Affiniti 50G/GE Logiq Images permanently stored and available for review in PACS Verbal informed consent obtained.  Discussed risks and benefits of procedure. Warned about infection, bleeding, hyperglycemia damage to structures among others. Patient expresses understanding and agreement Time-out conducted.   Noted no overlying erythema, induration, or other signs of local infection.   Skin prepped in a sterile fashion.   Local anesthesia: Topical Ethyl chloride.   With sterile technique and under real time ultrasound guidance: 40 mg of Kenalog and 2 mL of Marcaine injected into knee joint. Fluid seen entering the joint capsule.   Completed without difficulty   Pain moderately immediately resolved suggesting accurate  placement of the medication.   Advised to call if fevers/chills, erythema, induration, drainage, or persistent bleeding.   Images permanently stored and available for review in the ultrasound unit.  Impression: Technically successful ultrasound guided injection.   X-ray images left knee obtained today personally and independently interpreted. Tricompartmental DJD worse to medial compartment rated moderate.  No acute fractures are visible.  Spurring present medial femoral and tibial knee. Await formal radiology review     Assessment and Plan: 73 y.o. female with acute exacerbation of chronic left knee pain.  Previously patient had significant osteoarthritis on x-ray.  Today is an exacerbation of the same.  Plan for steroid injection and Voltaren gel and Tylenol.  If not improving consider advanced imaging if needed.   PDMP not reviewed this encounter. Orders Placed This Encounter  Procedures   [place] LIMITED JOINT SPACE STRUCTURES LOW LEFT(NO LINKED CHARGES)    Reason for Exam (SYMPTOM  OR DIAGNOSIS REQUIRED):   left knee pain    Preferred imaging location?:   [facility] Knee AP/LAT W/[place] Left    Standing Status:   Future    Number of Occurrences:   1    Expiration Date:   01/23/2024    Reason for Exam (SYMPTOM  OR DIAGNOSIS REQUIRED):   left knee pain    Preferred imaging location?:   [facility] [place] [facility]   No orders of the defined types were placed in this encounter.    Discussed warning signs or symptoms. Please see discharge instructions. Patient expresses understanding.   The above  documentation has been reviewed and is accurate and complete Clementeen Graham, M.D.

## 2023-12-26 ENCOUNTER — Telehealth: Payer: Self-pay

## 2023-12-26 DIAGNOSIS — M1712 Unilateral primary osteoarthritis, left knee: Secondary | ICD-10-CM

## 2023-12-26 DIAGNOSIS — G8929 Other chronic pain: Secondary | ICD-10-CM

## 2023-12-26 MED ORDER — TRAMADOL HCL 50 MG PO TABS: 50.0000 mg | ORAL_TABLET | Freq: Three times a day (TID) | ORAL | 0 refills | Status: DC | PRN

## 2023-12-26 MED ORDER — OXYCODONE-ACETAMINOPHEN 5-325 MG PO TABS: 1.0000 | ORAL_TABLET | Freq: Three times a day (TID) | ORAL | 0 refills | Status: DC | PRN

## 2023-12-26 NOTE — Addendum Note (Signed)
Addended by: Rodolph Bong on: 12/26/2023 09:20 AM   Modules accepted: Orders

## 2023-12-26 NOTE — Addendum Note (Signed)
Addended by: Rodolph Bong on: 12/26/2023 08:46 AM   Modules accepted: Orders

## 2023-12-26 NOTE — Telephone Encounter (Signed)
Patient called, she had steroid injection last Friday and continues to have pain and swelling. Pt denies fever, warmth, erythema, or drainage at the injection site. She has tried Tylenol as directed by Dr. Denyse Amass with no relief of symptoms. She is in a great deal of pain and would like for Dr. Denyse Amass to call something in for her.   Forwarding to Dr. Denyse Amass to advise.

## 2023-12-26 NOTE — Telephone Encounter (Signed)
I called [name] back.  We are going to cancel the tramadol prescription and try oxycodone instead as she has tolerated that pretty well in the past.  I also referred her to orthopedic surgery to talk about total knee replacement.  Of note I could hear her smoke detector beeping indicating that her battery was low.  Her grandson she thinks can come over and change the battery.

## 2023-12-26 NOTE — Telephone Encounter (Signed)
Called pt and advised that Rx for Tramadol has been sent in.   Pt notes that she cannot take Tramadol has it causes severe GI upset.   Tramadol added to allergy list as intolerance.   I asked if there is anything else that she has tried for pain that she can tolerate and has been beneficial in that past. She states that she cannot recall.   Forwarding to Dr. Denyse Amass.

## 2023-12-26 NOTE — Telephone Encounter (Signed)
Tramadol prescribed to [facility].

## 2024-01-02 NOTE — Progress Notes (Signed)
Left knee x-ray shows medium arthritis.

## 2024-01-03 ENCOUNTER — Telehealth: Payer: Self-pay

## 2024-01-03 NOTE — Telephone Encounter (Signed)
Pt reports cont'd knee pain. No relief from prior steroid injection. She notes not tolerating the oxycodone nor tramadol. She is wondering if there is anything else (besides Tylenol) that you could prescribe. Scheduled to see Dr. Roda Shutters 2/7

## 2024-01-05 ENCOUNTER — Encounter: Payer: Self-pay | Admitting: Internal Medicine

## 2024-01-05 ENCOUNTER — Ambulatory Visit: Payer: HMO | Admitting: Orthopaedic Surgery

## 2024-01-05 ENCOUNTER — Ambulatory Visit: Payer: HMO | Admitting: Internal Medicine

## 2024-01-05 VITALS — BP 132/80 | HR 68 | Temp 98.0°F | Resp 16 | Ht 67.0 in | Wt 182.4 lb

## 2024-01-05 DIAGNOSIS — Z860100 Personal history of colon polyps, unspecified: Secondary | ICD-10-CM

## 2024-01-05 DIAGNOSIS — E039 Hypothyroidism, unspecified: Secondary | ICD-10-CM

## 2024-01-05 DIAGNOSIS — E118 Type 2 diabetes mellitus with unspecified complications: Secondary | ICD-10-CM | POA: Diagnosis not present

## 2024-01-05 DIAGNOSIS — Z23 Encounter for immunization: Secondary | ICD-10-CM | POA: Diagnosis not present

## 2024-01-05 DIAGNOSIS — I10 Essential (primary) hypertension: Secondary | ICD-10-CM | POA: Diagnosis not present

## 2024-01-05 DIAGNOSIS — E785 Hyperlipidemia, unspecified: Secondary | ICD-10-CM | POA: Diagnosis not present

## 2024-01-05 DIAGNOSIS — Z0000 Encounter for general adult medical examination without abnormal findings: Secondary | ICD-10-CM | POA: Diagnosis not present

## 2024-01-05 DIAGNOSIS — Z0001 Encounter for general adult medical examination with abnormal findings: Secondary | ICD-10-CM

## 2024-01-05 LAB — CBC WITH DIFFERENTIAL/PLATELET
Basophils Absolute: 0.1 10*3/uL (ref 0.0–0.1)
Basophils Relative: 0.7 % (ref 0.0–3.0)
Eosinophils Absolute: 0.1 10*3/uL (ref 0.0–0.7)
Eosinophils Relative: 1.3 % (ref 0.0–5.0)
HCT: 39.2 % (ref 36.0–46.0)
Hemoglobin: 13.1 g/dL (ref 12.0–15.0)
Lymphocytes Relative: 28.4 % (ref 12.0–46.0)
Lymphs Abs: 2.4 10*3/uL (ref 0.7–4.0)
MCHC: 33.4 g/dL (ref 30.0–36.0)
MCV: 82.7 fL (ref 78.0–100.0)
Monocytes Absolute: 0.6 10*3/uL (ref 0.1–1.0)
Monocytes Relative: 7.2 % (ref 3.0–12.0)
Neutro Abs: 5.2 10*3/uL (ref 1.4–7.7)
Neutrophils Relative %: 62.4 % (ref 43.0–77.0)
Platelets: 303 10*3/uL (ref 150.0–400.0)
RBC: 4.74 Mil/uL (ref 3.87–5.11)
RDW: 14.4 % (ref 11.5–15.5)
WBC: 8.3 10*3/uL (ref 4.0–10.5)

## 2024-01-05 LAB — MICROALBUMIN / CREATININE URINE RATIO
Creatinine,U: 55.9 mg/dL
Microalb Creat Ratio: 1.3 mg/g (ref 0.0–30.0)
Microalb, Ur: <0.7 mg/dL (ref 0.0–1.9)

## 2024-01-05 LAB — BASIC METABOLIC PANEL
BUN: 10 mg/dL (ref 6–23)
CO2: 28 meq/L (ref 19–32)
Calcium: 9.6 mg/dL (ref 8.4–10.5)
Chloride: 101 meq/L (ref 96–112)
Creatinine, Ser: 0.79 mg/dL (ref 0.40–1.20)
GFR: 74.63 mL/min (ref >60.00)
Glucose, Bld: 106 mg/dL — ABNORMAL HIGH (ref 70–99)
Potassium: 3.9 meq/L (ref 3.5–5.1)
Sodium: 137 meq/L (ref 135–145)

## 2024-01-05 LAB — URINALYSIS, ROUTINE W REFLEX MICROSCOPIC
Bilirubin Urine: NEGATIVE
Hgb urine dipstick: NEGATIVE
Ketones, ur: NEGATIVE
Nitrite: NEGATIVE
RBC / HPF: NONE SEEN (ref 0–?)
Specific Gravity, Urine: <=1.005 — AB (ref 1.000–1.030)
Total Protein, Urine: NEGATIVE
Urine Glucose: NEGATIVE
Urobilinogen, UA: 0.2 (ref 0.0–1.0)
pH: 6 (ref 5.0–8.0)

## 2024-01-05 LAB — HM DIABETES EYE EXAM

## 2024-01-05 LAB — HEMOGLOBIN A1C: Hgb A1c MFr Bld: 6.5 % (ref 4.6–6.5)

## 2024-01-05 LAB — TSH: TSH: 2.74 u[IU]/mL (ref 0.35–5.50)

## 2024-01-05 NOTE — Patient Instructions (Signed)

## 2024-01-05 NOTE — Progress Notes (Signed)
 Subjective:  Patient ID: [name], female    DOB: [birth year]  Age: 73 y.o. MRN: [id number]  CC: Annual Exam, Hypertension, Hyperlipidemia, and Diabetes   HPI [name] presents for a CPX and f/up ---  Discussed the use of AI scribe software for clinical note transcription with the patient, who gave verbal consent to proceed.  History of Present Illness   [name] is a 73 year old female who presents for a routine follow-up and annual foot exam.  Approximately two weeks ago, she experienced severe knee pain that significantly impaired her ability to walk. She received an injection for the pain, which she believes worsened her condition, as she was unable to put pressure on the knee due to the severity of the pain. She was prescribed oxycodone  for pain management but discontinued it after experiencing itching. She is not currently taking tramadol  and is managing her pain with Tylenol  and rubbing alcohol. She mentions a previous discussion about potential knee surgery but is unsure about the necessity as she believes the issue might be fluid-related, although arthritis was noted.  She denies chest pain, shortness of breath, dizziness, lightheadedness, headaches, blurred vision, bleeding, bruising, or trouble swallowing. No pain or swelling in other areas.       Outpatient Medications Prior to Visit  Medication Sig Dispense Refill   amLODipine  (NORVASC ) 5 MG tablet Take 1 tablet (5 mg total) by mouth every morning. 90 tablet 3   carvedilol  (COREG ) 6.25 MG tablet Take 1 tablet (6.25 mg total) by mouth 2 (two) times daily with a meal. 180 tablet 3   clonazePAM  (KLONOPIN ) 0.5 MG tablet Take 1 tablet (0.5 mg total) by mouth 2 (two) times daily as needed for anxiety. 180 tablet 1   clopidogrel  (PLAVIX ) 75 MG tablet Take 1 tablet (75 mg total) by mouth every morning. 90 tablet 3   famotidine  (PEPCID ) 40 MG tablet TAKE 1 TABLET BY MOUTH EVERY MORNING 90 tablet 1   isosorbide  mononitrate  (IMDUR ) 30 MG 24 hr tablet Take 1 tablet (30 mg total) by mouth every morning. 90 tablet 3   levocetirizine (XYZAL ) 5 MG tablet TAKE 1 TABLET BY MOUTH HHS 90 tablet 1   levothyroxine  (SYNTHROID ) 100 MCG tablet Take 1 tablet (100 mcg total) by mouth daily before breakfast. 90 tablet 1   linaclotide  (LINZESS ) 145 MCG CAPS capsule Take 1 capsule (145 mcg total) by mouth daily before breakfast. 90 capsule 1   losartan  (COZAAR ) 25 MG tablet Take 1 tablet (25 mg total) by mouth every morning. 90 tablet 3   magnesium  oxide (MAG-OX) 400 MG tablet Take 400 mg by mouth daily.     nitroGLYCERIN  (NITROSTAT ) 0.4 MG SL tablet Place 1 tablet (0.4 mg total) under the tongue every 5 (five) minutes as needed for chest pain. 25 tablet 6   pantoprazole  (PROTONIX ) 20 MG tablet TAKE 1 TABLET BY MOUTH EVERY MORNING 90 tablet 1   potassium chloride  (KLOR-CON  10) 10 MEQ tablet Take 1 tablet (10 mEq total) by mouth 2 (two) times daily. 180 tablet 3   rosuvastatin  (CRESTOR ) 10 MG tablet Take 1 tablet (10 mg total) by mouth at bedtime. 90 tablet 3   Vitamin D , Ergocalciferol , (DRISDOL ) 1.25 MG (50000 UNIT) CAPS capsule TAKE 1 CAPSULE BY MOUTH ONE TIME PER WEEK 12 capsule 0   meloxicam  (MOBIC ) 15 MG tablet Take 1 tablet (15 mg total) by mouth daily. 30 tablet 0   oxyCODONE -acetaminophen  (PERCOCET/ROXICET) 5-325 MG tablet Take  1 tablet by mouth every 8 (eight) hours as needed for severe pain (pain score 7-10). 15 tablet 0   traMADol  (ULTRAM ) 50 MG tablet Take 1 tablet (50 mg total) by mouth every 8 (eight) hours as needed for severe pain (pain score 7-10). 15 tablet 0   No facility-administered medications prior to visit.    ROS Review of Systems  Constitutional: Negative.  Negative for diaphoresis and fatigue.  HENT: Negative.    Eyes: Negative.  Negative for visual disturbance.  Respiratory: Negative.  Negative for cough, chest tightness, shortness of breath and wheezing.   Cardiovascular:  Negative for chest pain,  palpitations and leg swelling.  Gastrointestinal: Negative.  Negative for abdominal pain, constipation, diarrhea, nausea and vomiting.  Genitourinary: Negative.  Negative for difficulty urinating.  Musculoskeletal:  Positive for arthralgias. Negative for gait problem and myalgias.  Skin: Negative.   Neurological:  Negative for dizziness.  Hematological:  Negative for adenopathy. Does not bruise/bleed easily.  Psychiatric/Behavioral:  Positive for confusion and decreased concentration. Negative for dysphoric mood and suicidal ideas. The patient is not nervous/anxious.     Objective:  BP 132/80 (BP Location: Left Arm, Patient Position: Sitting, Cuff Size: Normal)   Pulse 68   Temp 98 F (36.7 C) (Oral)   Resp 16   Ht 5' 7 (1.702 m)   Wt 182 lb 6.4 oz (82.7 kg)   SpO2 99%   BMI 28.57 kg/m   BP Readings from Last 3 Encounters:  01/05/24 132/80  12/23/23 (!) 144/82  07/26/23 134/82    Wt Readings from Last 3 Encounters:  01/06/24 182 lb (82.6 kg)  01/05/24 182 lb 6.4 oz (82.7 kg)  12/23/23 185 lb (83.9 kg)    Physical Exam Vitals reviewed.  Constitutional:      Appearance: Normal appearance.  HENT:     Nose: Nose normal.     Mouth/Throat:     Mouth: Mucous membranes are moist.  Eyes:     General: No scleral icterus.    Conjunctiva/sclera: Conjunctivae normal.  Cardiovascular:     Rate and Rhythm: Normal rate and regular rhythm.     Heart sounds: No murmur heard.    No friction rub. No gallop.  Pulmonary:     Effort: Pulmonary effort is normal.     Breath sounds: No stridor. No wheezing, rhonchi or rales.  Abdominal:     General: Abdomen is flat.     Palpations: There is no mass.     Tenderness: There is no abdominal tenderness. There is no guarding.     Hernia: No hernia is present.  Musculoskeletal:        General: Normal range of motion.     Cervical back: Neck supple.     Right lower leg: No edema.     Left lower leg: No edema.  Lymphadenopathy:      Cervical: No cervical adenopathy.  Skin:    General: Skin is warm and dry.  Neurological:     General: No focal deficit present.     Mental Status: She is alert and oriented to person, place, and time. Mental status is at baseline.  Psychiatric:        Mood and Affect: Mood normal.        Behavior: Behavior normal.        Thought Content: Thought content normal.     Lab Results  Component Value Date   WBC 8.3 01/05/2024   HGB 13.1 01/05/2024   HCT 39.2  01/05/2024   PLT 303.0 01/05/2024   GLUCOSE 106 (H) 01/05/2024   CHOL 97 01/03/2023   TRIG 80.0 01/03/2023   HDL 41.30 01/03/2023   LDLDIRECT 88.0 10/19/2016   LDLCALC 40 01/03/2023   ALT 23 07/05/2023   AST 30 07/05/2023   NA 137 01/05/2024   K 3.9 01/05/2024   CL 101 01/05/2024   CREATININE 0.79 01/05/2024   BUN 10 01/05/2024   CO2 28 01/05/2024   TSH 2.74 01/05/2024   INR 1.01 05/08/2018   HGBA1C 6.5 01/05/2024   MICROALBUR <0.7 01/05/2024    MM 3D SCREENING MAMMOGRAM BILATERAL BREAST Result Date: 09/06/2023 CLINICAL DATA:  Screening. EXAM: DIGITAL SCREENING BILATERAL MAMMOGRAM WITH TOMOSYNTHESIS AND CAD TECHNIQUE: Bilateral screening digital craniocaudal and mediolateral oblique mammograms were obtained. Bilateral screening digital breast tomosynthesis was performed. The images were evaluated with computer-aided detection. COMPARISON:  Previous exam(s). ACR Breast Density Category b: There are scattered areas of fibroglandular density. FINDINGS: There are no findings suspicious for malignancy. IMPRESSION: No mammographic evidence of malignancy. A result letter of this screening mammogram will be mailed directly to the patient. RECOMMENDATION: Screening mammogram in one year. (Code:SM-B-01Y) BI-RADS CATEGORY  1: Negative. Electronically Signed   By: Rosaline Collet M.D.   On: 09/06/2023 18:32    Assessment & Plan:   Acquired hypothyroidism- She is euthyroid. -     TSH; Future  Hyperlipidemia LDL goal <70- LDL goal  achieved. Doing well on the statin  -     TSH; Future  Essential hypertension -     CBC with Differential/Platelet; Future -     Basic metabolic panel; Future -     TSH; Future -     Urinalysis, Routine w reflex microscopic; Future  Type II diabetes mellitus with manifestations (HCC)- Her blood sugar is well controlled. -     Basic metabolic panel; Future -     Hemoglobin A1c; Future -     Urinalysis, Routine w reflex microscopic; Future -     Microalbumin / creatinine urine ratio; Future -     HM Diabetes Foot Exam  Encounter for general adult medical examination with abnormal findings- Exam completed, labs reviewed, vaccines reviewed and updated, cancer screenings addressed, pt ed material was given.   History of colonic polyps -     Ambulatory referral to Gastroenterology  Encounter for immunization -     Flu Vaccine Trivalent High Dose (Fluad)     Follow-up: Return in about 6 months (around 07/04/2024).  Debby Molt, MD

## 2024-01-06 ENCOUNTER — Ambulatory Visit (INDEPENDENT_AMBULATORY_CARE_PROVIDER_SITE_OTHER): Payer: HMO

## 2024-01-06 ENCOUNTER — Ambulatory Visit (INDEPENDENT_AMBULATORY_CARE_PROVIDER_SITE_OTHER): Payer: HMO | Admitting: Orthopaedic Surgery

## 2024-01-06 ENCOUNTER — Encounter: Payer: Self-pay | Admitting: Internal Medicine

## 2024-01-06 VITALS — Ht 67.0 in | Wt 182.0 lb

## 2024-01-06 DIAGNOSIS — M1712 Unilateral primary osteoarthritis, left knee: Secondary | ICD-10-CM | POA: Diagnosis not present

## 2024-01-06 DIAGNOSIS — Z0000 Encounter for general adult medical examination without abnormal findings: Secondary | ICD-10-CM

## 2024-01-06 NOTE — Progress Notes (Signed)
 Subjective:   [name] is a 73 y.o. female who presents for Medicare Annual (Subsequent) preventive examination.  Visit Complete: Virtual I connected with  [name] on 01/09/24 by a audio enabled telemedicine application and verified that I am speaking with the correct person using two identifiers.  Patient Location: Home  Provider Location: Office/Clinic  I discussed the limitations of evaluation and management by telemedicine. The patient expressed understanding and agreed to proceed.  Vital Signs: Because this visit was a virtual/telehealth visit, some criteria may be missing or patient reported. Any vitals not documented were not able to be obtained and vitals that have been documented are patient reported.    Cardiac Risk Factors include: advanced age (>87men, >39 women);hypertension;Other (see comment);diabetes mellitus;dyslipidemia, Risk factor comments: CAD,     Objective:    Today's Vitals   01/06/24 1549  Weight: 182 lb (82.6 kg)  Height: 5' 7 (1.702 m)   Body mass index is 28.51 kg/m.     01/09/2024    4:14 PM 01/18/2023   10:13 AM 10/05/2022    8:12 AM 12/11/2021    2:39 PM 10/06/2020    7:43 AM 08/17/2019    8:36 AM 05/22/2019    8:35 AM  Advanced Directives  Does Patient Have a Medical Advance Directive? Yes No No No No No No  Type of Estate Agent of [place];Living will        Copy of Healthcare Power of Attorney in Chart? No - copy requested        Would patient like information on creating a medical advance directive?  Yes (MAU/Ambulatory/Procedural Areas - Information given) Yes (MAU/Ambulatory/Procedural Areas - Information given) No - Patient declined No - Patient declined No - Patient declined No - Patient declined    Current Medications (verified) Outpatient Encounter Medications as of 01/06/2024  Medication Sig   amLODipine  (NORVASC ) 5 MG tablet Take 1 tablet (5 mg total) by mouth every morning.   carvedilol  (COREG ) 6.25  MG tablet Take 1 tablet (6.25 mg total) by mouth 2 (two) times daily with a meal.   clonazePAM  (KLONOPIN ) 0.5 MG tablet Take 1 tablet (0.5 mg total) by mouth 2 (two) times daily as needed for anxiety.   clopidogrel  (PLAVIX ) 75 MG tablet Take 1 tablet (75 mg total) by mouth every morning.   famotidine  (PEPCID ) 40 MG tablet TAKE 1 TABLET BY MOUTH EVERY MORNING   isosorbide  mononitrate (IMDUR ) 30 MG 24 hr tablet Take 1 tablet (30 mg total) by mouth every morning.   levocetirizine (XYZAL ) 5 MG tablet TAKE 1 TABLET BY MOUTH HHS   levothyroxine  (SYNTHROID ) 100 MCG tablet Take 1 tablet (100 mcg total) by mouth daily before breakfast.   linaclotide  (LINZESS ) 145 MCG CAPS capsule Take 1 capsule (145 mcg total) by mouth daily before breakfast.   losartan  (COZAAR ) 25 MG tablet Take 1 tablet (25 mg total) by mouth every morning.   magnesium  oxide (MAG-OX) 400 MG tablet Take 400 mg by mouth daily.   nitroGLYCERIN  (NITROSTAT ) 0.4 MG SL tablet Place 1 tablet (0.4 mg total) under the tongue every 5 (five) minutes as needed for chest pain.   pantoprazole  (PROTONIX ) 20 MG tablet TAKE 1 TABLET BY MOUTH EVERY MORNING   potassium chloride  (KLOR-CON  10) 10 MEQ tablet Take 1 tablet (10 mEq total) by mouth 2 (two) times daily.   rosuvastatin  (CRESTOR ) 10 MG tablet Take 1 tablet (10 mg total) by mouth at bedtime.   Vitamin D , Ergocalciferol , (  DRISDOL ) 1.25 MG (50000 UNIT) CAPS capsule TAKE 1 CAPSULE BY MOUTH ONE TIME PER WEEK   No facility-administered encounter medications on file as of 01/06/2024.    Allergies (verified) Aspirin, Gadolinium, Iohexol , Livalo  [pitavastatin ], Tramadol  hcl, Hydrocodone -acetaminophen , Lipitor [atorvastatin], and Oxycodone    History: Past Medical History:  Diagnosis Date   Anemia    Arthritis    Cardiomyopathy (HCC)    Cholelithiasis    Chronic back pain    Constipation    Esophageal reflux    Fibromyalgia    Hemorrhoids    Hyperlipidemia    Hypertension    Hypothyroid    IBS  (irritable bowel syndrome)    Internal hemorrhoids without mention of complication    Left leg pain    Myocardial infarction [facility])    Personal history of colonic polyps 05/06/2010   ADENOMATOUS POLYP   Pneumonia    Tubular adenoma of colon    Past Surgical History:  Procedure Laterality Date   ABDOMINAL HYSTERECTOMY     CERVICAL DISC SURGERY     PLATE IN NECK & BACK   CHOLECYSTECTOMY     COLONOSCOPY  09/27/2011   NORMAL    COLONOSCOPY WITH PROPOFOL  N/A 10/06/2020   Procedure: COLONOSCOPY WITH PROPOFOL ;  Surgeon: Wilhelmenia Aloha Raddle., MD;  Location: [facility] ENDOSCOPY;  Service: Gastroenterology;  Laterality: N/A;  ultra slim scope    LEFT HEART CATH AND CORONARY ANGIOGRAPHY N/A 05/08/2018   Procedure: LEFT HEART CATH AND CORONARY ANGIOGRAPHY;  Surgeon: Claudene Victory ORN, MD;  Location: [facility] INVASIVE CV LAB;  Service: Cardiovascular;  Laterality: N/A;   LUMBAR DISC SURGERY     POLYPECTOMY  10/06/2020   Procedure: POLYPECTOMY;  Surgeon: Mansouraty, Aloha Raddle., MD;  Location: [facility] ENDOSCOPY;  Service: Gastroenterology;;   TONSILLECTOMY     Family History  Problem Relation Age of Onset   Diabetes Mother        aunts and uncles   Kidney disease Mother        stage 3   Lung disease Mother        colapsed lung/in hospice   COPD Mother    Hypertension Mother    Dementia Mother    Breast cancer Maternal Aunt    Heart failure Father        Mother   Breast cancer Cousin    Colon cancer Neg Hx    Esophageal cancer Neg Hx    Stomach cancer Neg Hx    Rectal cancer Neg Hx    Social History   Socioeconomic History   Marital status: Widowed    Spouse name: Not on file   Number of children: 2   Years of education: Not on file   Highest education level: Not on file  Occupational History   Occupation: housewife    Employer: UNEMPLOYED   Tobacco Use   Smoking status: Never    Passive exposure: Never   Smokeless tobacco: Never  Vaping Use   Vaping status: Never Used  Substance and Sexual  Activity   Alcohol use: No    Alcohol/week: 0.0 standard drinks of alcohol   Drug use: No   Sexual activity: Not Currently  Other Topics Concern   Not on file  Social History Narrative   Daily Caffeine Use   Lives alone-2025   Social Drivers of Health   Financial Resource Strain: Low Risk  (01/06/2024)   Overall Financial Resource Strain (CARDIA)    Difficulty of Paying Living Expenses: Not hard at all  Food  Insecurity: No Food Insecurity (01/06/2024)   Hunger Vital Sign    Worried About Running Out of Food in the Last Year: Never true    Ran Out of Food in the Last Year: Never true  Transportation Needs: No Transportation Needs (01/06/2024)   PRAPARE - Administrator, Civil Service (Medical): No    Lack of Transportation (Non-Medical): No  Physical Activity: Insufficiently Active (01/06/2024)   Exercise Vital Sign    Days of Exercise per Week: 7 days    Minutes of Exercise per Session: 10 min  Stress: No Stress Concern Present (01/06/2024)   [employer] of Occupational Health - Occupational Stress Questionnaire    Feeling of Stress : Not at all  Social Connections: Moderately Integrated (01/06/2024)   Social Connection and Isolation Panel [NHANES]    Frequency of Communication with Friends and Family: More than three times a week    Frequency of Social Gatherings with Friends and Family: More than three times a week    Attends Religious Services: More than 4 times per year    Active Member of [employer] or Organizations: Yes    Attends Banker Meetings: Never    Marital Status: Widowed    Tobacco Counseling Counseling given: Not Answered   Clinical Intake:  Pre-visit preparation completed: Yes  Pain : No/denies pain     BMI - recorded: 28.51 Nutritional Status: BMI 25 -29 Overweight Nutritional Risks: None Diabetes: No  How often do you need to have someone help you when you read instructions, pamphlets, or other written materials from your  doctor or pharmacy?: 1 - Never  Interpreter Needed?: No  Information entered by :: Faithe Ariola, RMA   Activities of Daily Living    01/06/2024    3:52 PM 01/18/2023   10:13 AM  In your present state of health, do you have any difficulty performing the following activities:  Hearing? 1 0  Comment issues with lt ear   Vision? 0 0  Difficulty concentrating or making decisions? 0 0  Walking or climbing stairs? 0 0  Dressing or bathing? 0 0  Doing errands, shopping? 0 0  Preparing Food and eating ? N N  Using the Toilet? N N  In the past six months, have you accidently leaked urine? N N  Do you have problems with loss of bowel control? N N  Managing your Medications? N N  Managing your Finances? N N  Housekeeping or managing your Housekeeping? N N    Patient Care Team: [name], MD as PCP - Diedre Wonda Sharper, MD as PCP - Cardiology (Cardiology) Szabat, Toribio BROCKS, [facility] (Inactive) as Pharmacist (Pharmacist)  Indicate any recent Medical Services you may have received from other than [facility] providers in the past year (date may be approximate).     Assessment:   This is a routine wellness examination for Rhyanna.  Hearing/Vision screen Hearing Screening - Comments:: Some issues in lt ear Vision Screening - Comments:: Wears eyeglasses   Goals Addressed               This Visit's Progress     Patient Stated (pt-stated)        Continue to walk      Depression Screen    01/06/2024    3:59 PM 01/18/2023   10:13 AM 07/21/2022    9:34 AM 12/11/2021    2:40 PM 12/11/2021    2:38 PM 03/30/2021    8:02 AM 05/21/2020  9:05 AM  PHQ 2/9 Scores  PHQ - 2 Score 3 0 0 0 0 0 0  PHQ- 9 Score 3          Fall Risk    01/06/2024    3:56 PM 01/18/2023   10:11 AM 07/21/2022    9:34 AM 12/11/2021    2:39 PM 06/11/2021    8:24 AM  Fall Risk   Falls in the past year? 0 0 0 0 0  Number falls in past yr: 0 0 0 0   Injury with Fall? 0 0 0 0   Risk for fall due to : No Fall Risks   No Fall Risks    Follow up Falls prevention discussed;Falls evaluation completed Falls prevention discussed;Education provided;Falls evaluation completed Falls evaluation completed Falls evaluation completed     MEDICARE RISK AT HOME: Medicare Risk at Home Any stairs in or around the home?: Yes If so, are there any without handrails?: No Home free of loose throw rugs in walkways, pet beds, electrical cords, etc?: Yes Adequate lighting in your home to reduce risk of falls?: Yes Life alert?: No Use of a cane, walker or w/c?: No Grab bars in the bathroom?: No Elevated toilet seat or a handicapped toilet?: No  TIMED UP AND GO:  Was the test performed?  No    Cognitive Function:        01/06/2024    3:57 PM 01/18/2023   10:13 AM  6CIT Screen  What Year? 0 points 0 points  What month? 0 points 0 points  What time? 0 points 0 points  Count back from 20 0 points 0 points  Months in reverse 4 points 0 points  Repeat phrase 0 points 0 points  Total Score 4 points 0 points    Immunizations Immunization History  Administered Date(s) Administered   Fluad Quad(high Dose 65+) 08/13/2019, 12/31/2020, 09/14/2022   Fluad Trivalent(High Dose 65+) 01/05/2024   Influenza Split 11/15/2011   Influenza Whole 12/11/2009   Influenza, High Dose Seasonal PF 10/19/2016, 12/01/2017, 11/06/2018   Influenza,inj,Quad PF,6+ Mos 08/10/2013, 09/18/2015   PFIZER(Purple Top)SARS-COV-2 Vaccination 03/07/2020, 03/31/2020, 11/17/2020   Pneumococcal Conjugate-13 06/14/2016   Pneumococcal Polysaccharide-23 07/20/2013, 01/12/2019   Td 07/16/2009, 06/23/2010   Tdap 06/30/2020   Zoster, Live 11/06/2013    TDAP status: Up to date  Flu Vaccine status: Up to date  Pneumococcal vaccine status: Up to date  Covid-19 vaccine status: Declined, Education has been provided regarding the importance of this vaccine but patient still declined. Advised may receive this vaccine at local pharmacy or Health Dept.or vaccine  clinic. Aware to provide a copy of the vaccination record if obtained from local pharmacy or Health Dept. Verbalized acceptance and understanding.  Qualifies for Shingles Vaccine? Yes   Zostavax completed Yes   Shingrix  Completed?: No.    Education has been provided regarding the importance of this vaccine. Patient has been advised to call insurance company to determine out of pocket expense if they have not yet received this vaccine. Advised may also receive vaccine at local pharmacy or Health Dept. Verbalized acceptance and understanding.  Screening Tests Health Maintenance  Topic Date Due   Zoster Vaccines- Shingrix  (1 of 2) 05/23/2001   COVID-19 Vaccine (4 - 2024-25 season) 07/31/2023   Colonoscopy  10/07/2023   HEMOGLOBIN A1C  07/04/2024   MAMMOGRAM  10/29/2024   Diabetic kidney evaluation - eGFR measurement  01/04/2025   Diabetic kidney evaluation - Urine ACR  01/04/2025   FOOT  EXAM  01/04/2025   OPHTHALMOLOGY EXAM  01/04/2025   Medicare Annual Wellness (AWV)  01/05/2025   DTaP/Tdap/Td (4 - Td or Tdap) 06/30/2030   Pneumonia Vaccine 74+ Years old  Completed   INFLUENZA VACCINE  Completed   DEXA SCAN  Completed   Hepatitis C Screening  Completed   HPV VACCINES  Aged Out    Health Maintenance  Health Maintenance Due  Topic Date Due   Zoster Vaccines- Shingrix  (1 of 2) 05/23/2001   COVID-19 Vaccine (4 - 2024-25 season) 07/31/2023   Colonoscopy  10/07/2023    Colorectal cancer screening: Referral to GI placed 01/05/2024. Pt aware the office will call re: appt.  Mammogram status: Completed 09/06/2023. Repeat every year  Bone Density status: Completed 05/10/2022. Results reflect: Bone density results: OSTEOPENIA. Repeat every 2 years.  Lung Cancer Screening: (Low Dose CT Chest recommended if Age 60-80 years, 20 pack-year currently smoking OR have quit w/in 15years.) does not qualify.   Lung Cancer Screening Referral: N/A  Additional Screening:  Hepatitis C Screening:  does qualify; Completed 01/05/2022  Vision Screening: Recommended annual ophthalmology exams for early detection of glaucoma and other disorders of the eye. Is the patient up to date with their annual eye exam?  Yes  Who is the provider or what is the name of the office in which the patient attends annual eye exams? [facility] If pt is not established with a provider, would they like to be referred to a provider to establish care? No .   Dental Screening: Recommended annual dental exams for proper oral hygiene  Diabetic Foot Exam: Diabetic Foot Exam: Completed 01/05/2024  Community Resource Referral / Chronic Care Management: CRR required this visit?  No   CCM required this visit?  No     Plan:     I have personally reviewed and noted the following in the patient's chart:   Medical and social history Use of alcohol, tobacco or illicit drugs  Current medications and supplements including opioid prescriptions. Patient is not currently taking opioid prescriptions. Functional ability and status Nutritional status Physical activity Advanced directives List of other physicians Hospitalizations, surgeries, and ER visits in previous 12 months Vitals Screenings to include cognitive, depression, and falls Referrals and appointments  In addition, I have reviewed and discussed with patient certain preventive protocols, quality metrics, and best practice recommendations. A written personalized care plan for preventive services as well as general preventive health recommendations were provided to patient.     Edrick Whitehorn L Harlyn Italiano, CMA   01/06/2024  After Visit Summary: (MyChart) Due to this being a telephonic visit, the after visit summary with patients personalized plan was offered to patient via MyChart   Nurse Notes: Patient is due for a colonoscopy and a Shingrix  vaccine.  She will due for a DEXA this summer.  Patient had a recent eye exam.  She had no other concerns to address  today.

## 2024-01-06 NOTE — Progress Notes (Signed)
 Office Visit Note   Patient: [name]           Date of Birth: 05-05-1951           MRN: [id number] Visit Date: 01/06/2024              Requested by: Joane Artist RAMAN, MD [address] [place],  [place] [address] PCP: [name], MD   Assessment & Plan: Visit Diagnoses:  1. Primary osteoarthritis of left knee     Plan: [name] is a 73 year old female with left knee pain and osteoarthritis.  Pain is currently mild.  She is not looking for any other treatments today.  Questions encouraged and answered.  Follow-up as needed.  Follow-Up Instructions: No follow-ups on file.   Orders:  No orders of the defined types were placed in this encounter.  No orders of the defined types were placed in this encounter.     Procedures: No procedures performed   Clinical Data: No additional findings.   Subjective: Chief Complaint  Patient presents with   Left Knee - Pain    HPI [name] is a 73 year old female comes in for evaluation of medial left knee pain for about 2 weeks.  Reports that the pain comes and goes and associated with swelling.  Feels like the pain gets worse with increasing activity.  States that currently her pain is 1 out of 10.  Saw Dr. Joane couple weeks ago and did a cortisone injection which has significantly improved the pain.  She has fair amount of DJD. Review of Systems  Constitutional: Negative.   HENT: Negative.    Eyes: Negative.   Respiratory: Negative.    Cardiovascular: Negative.   Endocrine: Negative.   Musculoskeletal:  Positive for arthralgias.  Neurological: Negative.   Hematological: Negative.   Psychiatric/Behavioral: Negative.    All other systems reviewed and are negative.    Objective: Vital Signs: There were no vitals taken for this visit.  Physical Exam Vitals and nursing note reviewed.  Constitutional:      Appearance: She is well-developed.  HENT:     Head: Normocephalic and atraumatic.  Pulmonary:     Effort: Pulmonary  effort is normal.  Abdominal:     Palpations: Abdomen is soft.  Musculoskeletal:     Cervical back: Neck supple.  Skin:    General: Skin is warm.     Capillary Refill: Capillary refill takes less than 2 seconds.  Neurological:     Mental Status: She is alert and oriented to person, place, and time.  Psychiatric:        Behavior: Behavior normal.        Thought Content: Thought content normal.        Judgment: Judgment normal.     Ortho Exam Examination of the left knee shows no joint effusion.  She has medial joint line tenderness.  Collaterals and cruciates are stable.  Crepitus and pain with range of motion. Specialty Comments:  No specialty comments available.  Imaging: No results found.   PMFS History: Patient Active Problem List   Diagnosis Date Noted   Eczema of scalp 01/05/2023   Encounter for general adult medical examination with abnormal findings 01/03/2023   Somnolence 09/23/2022   Flu vaccine need 09/14/2022   Sensorineural hearing loss (SNHL) of left ear with unrestricted hearing of right ear 07/21/2022   Primary osteoarthritis of left knee 12/24/2021   Onychomycosis of left great toe 12/14/2021   Type II diabetes  mellitus with manifestations (HCC) 12/14/2021   Balding 05/21/2020   Allergic rhinitis 04/12/2020   Takotsubo cardiomyopathy 02/26/2019   NASH (nonalcoholic steatohepatitis) 01/22/2019   Chronic idiopathic constipation 01/12/2019   Arthritis of carpometacarpal (CMC) joint of left thumb 09/25/2018   Degenerative arthritis of left knee 05/24/2018   CAD (coronary artery disease) 05/16/2018   Vitamin D  deficiency 11/11/2017   Degenerative disc disease, lumbar 08/29/2017   Obesity (BMI 30.0-34.9) 06/14/2016   Osteopenia 07/20/2013   GERD (gastroesophageal reflux disease) 09/16/2011   Generalized anxiety disorder 09/16/2011   History of colonic polyps 08/17/2011   DJD (degenerative joint disease) of knee 06/25/2011   Pure hyperglyceridemia  06/24/2011   Hyperlipidemia LDL goal <70 07/30/2009   Hypothyroidism 04/25/2009   Essential hypertension 04/25/2009   Past Medical History:  Diagnosis Date   Anemia    Arthritis    Cardiomyopathy (HCC)    Cholelithiasis    Chronic back pain    Constipation    Esophageal reflux    Fibromyalgia    Hemorrhoids    Hyperlipidemia    Hypertension    Hypothyroid    IBS (irritable bowel syndrome)    Internal hemorrhoids without mention of complication    Left leg pain    Myocardial infarction [facility])    Personal history of colonic polyps 05/06/2010   ADENOMATOUS POLYP   Pneumonia    Tubular adenoma of colon     Family History  Problem Relation Age of Onset   Diabetes Mother        aunts and uncles   Kidney disease Mother        stage 3   Lung disease Mother        colapsed lung/in hospice   COPD Mother    Hypertension Mother    Dementia Mother    Breast cancer Maternal Aunt    Heart failure Father        Mother   Breast cancer Cousin    Colon cancer Neg Hx    Esophageal cancer Neg Hx    Stomach cancer Neg Hx    Rectal cancer Neg Hx     Past Surgical History:  Procedure Laterality Date   ABDOMINAL HYSTERECTOMY     CERVICAL DISC SURGERY     PLATE IN NECK & BACK   CHOLECYSTECTOMY     COLONOSCOPY  09/27/2011   NORMAL    COLONOSCOPY WITH PROPOFOL  N/A 10/06/2020   Procedure: COLONOSCOPY WITH PROPOFOL ;  Surgeon: Wilhelmenia Aloha Raddle., MD;  Location: [facility] ENDOSCOPY;  Service: Gastroenterology;  Laterality: N/A;  ultra slim scope    LEFT HEART CATH AND CORONARY ANGIOGRAPHY N/A 05/08/2018   Procedure: LEFT HEART CATH AND CORONARY ANGIOGRAPHY;  Surgeon: Claudene Victory ORN, MD;  Location: [facility] INVASIVE CV LAB;  Service: Cardiovascular;  Laterality: N/A;   LUMBAR DISC SURGERY     POLYPECTOMY  10/06/2020   Procedure: POLYPECTOMY;  Surgeon: Mansouraty, Aloha Raddle., MD;  Location: [facility] ENDOSCOPY;  Service: Gastroenterology;;   TONSILLECTOMY     Social History   Occupational History    Occupation: housewife    Employer: UNEMPLOYED   Tobacco Use   Smoking status: Never    Passive exposure: Never   Smokeless tobacco: Never  Vaping Use   Vaping status: Never Used  Substance and Sexual Activity   Alcohol use: No    Alcohol/week: 0.0 standard drinks of alcohol   Drug use: No   Sexual activity: Not Currently

## 2024-01-08 DIAGNOSIS — Z23 Encounter for immunization: Secondary | ICD-10-CM | POA: Insufficient documentation

## 2024-01-09 ENCOUNTER — Telehealth: Payer: Self-pay | Admitting: Gastroenterology

## 2024-01-09 NOTE — Telephone Encounter (Signed)
Noted    GM

## 2024-01-09 NOTE — Telephone Encounter (Signed)
 Dr Brice Campi ok to schedule colon- see 3 year recall.

## 2024-01-09 NOTE — Telephone Encounter (Signed)
 [name], Do you want to do her surveillance or myself?  [name], Wait for Dr. Marietta Shorter response.  I am happy to do it since I was able to traverse the area previously, if he wants me to.  If I am going to do the procedure, I will need to be scheduled as a 1 hour colonoscopy in hospital-based setting. Thanks. GM

## 2024-01-09 NOTE — Patient Instructions (Signed)
 [name] , Thank you for taking time to come for your Medicare Wellness Visit. I appreciate your ongoing commitment to your health goals. Please review the following plan we discussed and let me know if I can assist you in the future.   Referrals/Orders/Follow-Ups/Clinician Recommendations: It was nice to talk with you today.  Aim for 30 minutes of exercise or brisk walking, 6-8 glasses of water, and 5 servings of fruits and vegetables each day.   This is a list of the screening recommended for you and due dates:  Health Maintenance  Topic Date Due   Zoster (Shingles) Vaccine (1 of 2) 05/23/2001   COVID-19 Vaccine (4 - 2024-25 season) 07/31/2023   Colon Cancer Screening  10/07/2023   Hemoglobin A1C  07/04/2024   Mammogram  10/29/2024   Yearly kidney function blood test for diabetes  01/04/2025   Yearly kidney health urinalysis for diabetes  01/04/2025   Complete foot exam   01/04/2025   Eye exam for diabetics  01/04/2025   Medicare Annual Wellness Visit  01/05/2025   DTaP/Tdap/Td vaccine (4 - Td or Tdap) 06/30/2030   Pneumonia Vaccine  Completed   Flu Shot  Completed   DEXA scan (bone density measurement)  Completed   Hepatitis C Screening  Completed   HPV Vaccine  Aged Out    Advanced directives: (Copy Requested) Please bring a copy of your health care power of attorney and living will to the office to be added to your chart at your convenience.  Next Medicare Annual Wellness Visit scheduled for next year: Yes

## 2024-01-09 NOTE — Telephone Encounter (Signed)
 I am okay to re-attempt the colonoscopy, but agree would need to be scheduled with me in 1 hour slot in outpatient hospital setting

## 2024-01-09 NOTE — Telephone Encounter (Signed)
 I will send to [facility] to set up

## 2024-01-09 NOTE — Telephone Encounter (Signed)
 Patient called and stated that she was ready to schedule her colonoscopy procedure. Patient is requesting a call back. Please advise.

## 2024-01-13 NOTE — Progress Notes (Signed)
 Order(s) created erroneously. Erroneous order ID: [id number]  Order moved by: CHART CORRECTION ANALYST, FIFTEEN  Order move date/time: 01/13/2024 2:36 PM  Source Patient: [id number]  Source Contact: 01/06/2024  Destination Patient: [id number]  Destination Contact: 02/13/2013

## 2024-01-20 ENCOUNTER — Other Ambulatory Visit: Payer: Self-pay

## 2024-01-20 DIAGNOSIS — Z1211 Encounter for screening for malignant neoplasm of colon: Secondary | ICD-10-CM

## 2024-01-20 DIAGNOSIS — Z860100 Personal history of colon polyps, unspecified: Secondary | ICD-10-CM

## 2024-01-20 NOTE — Telephone Encounter (Signed)
Pt scheduled for previsit 03/20/24 at 11am. Colon scheduled at [facility] 04/09/24 at 10:15am for an hour slot. RUEA#[id number]. Spoke with pt and she is aware of appts.

## 2024-01-20 NOTE — Telephone Encounter (Signed)
Pt scheduled for Colon at [facility] for 1 hour 04/09/24 at 10:15am. Case#[id number].

## 2024-01-25 ENCOUNTER — Other Ambulatory Visit: Payer: Self-pay | Admitting: Internal Medicine

## 2024-01-25 DIAGNOSIS — J302 Other seasonal allergic rhinitis: Secondary | ICD-10-CM

## 2024-01-27 ENCOUNTER — Other Ambulatory Visit: Payer: Self-pay | Admitting: Internal Medicine

## 2024-01-27 DIAGNOSIS — K2100 Gastro-esophageal reflux disease with esophagitis, without bleeding: Secondary | ICD-10-CM

## 2024-01-27 DIAGNOSIS — K219 Gastro-esophageal reflux disease without esophagitis: Secondary | ICD-10-CM

## 2024-01-30 ENCOUNTER — Ambulatory Visit (INDEPENDENT_AMBULATORY_CARE_PROVIDER_SITE_OTHER): Admitting: Internal Medicine

## 2024-01-30 ENCOUNTER — Encounter: Payer: Self-pay | Admitting: Internal Medicine

## 2024-01-30 VITALS — BP 122/80 | HR 71 | Temp 98.7°F | Ht 67.0 in | Wt 181.0 lb

## 2024-01-30 DIAGNOSIS — J069 Acute upper respiratory infection, unspecified: Secondary | ICD-10-CM | POA: Diagnosis not present

## 2024-01-30 MED ORDER — METHYLPREDNISOLONE 4 MG PO TBPK: ORAL_TABLET | ORAL | 0 refills | Status: DC

## 2024-01-30 NOTE — Patient Instructions (Signed)
 We have sent in the medrol dose pack.   Day 1 take 6 pills, day 2 take 5 pills, day 4 take 3 pills, day 5 take 2 pills, day 6 take 1 pill then stop

## 2024-01-30 NOTE — Progress Notes (Unsigned)
   Subjective:   Patient ID: [name], female    DOB: [birth year], 73 y.o.   MRN: [id number]  HPI The patient is a 73 YO female coming in for sneezing and coughing for a week or so. Denies fevers or chills. Cough mild and not productive. Exposure to sick contact. Overall not worsening not improving.   Review of Systems  Constitutional:  Positive for activity change and appetite change. Negative for chills, fatigue, fever and unexpected weight change.  HENT:  Positive for congestion, postnasal drip, rhinorrhea and sinus pressure. Negative for ear discharge, ear pain, sinus pain, sneezing, sore throat, tinnitus, trouble swallowing and voice change.   Eyes: Negative.   Respiratory:  Positive for cough. Negative for chest tightness, shortness of breath and wheezing.   Cardiovascular: Negative.   Gastrointestinal: Negative.   Musculoskeletal:  Positive for myalgias.  Neurological: Negative.     Objective:  Physical Exam Constitutional:      Appearance: She is well-developed.  HENT:     Head: Normocephalic and atraumatic.     Comments: Oropharynx with redness and clear drainage, nose with swollen turbinates, TMs normal bilaterally.  Neck:     Thyroid: No thyromegaly.  Cardiovascular:     Rate and Rhythm: Normal rate and regular rhythm.  Pulmonary:     Effort: Pulmonary effort is normal. No respiratory distress.     Breath sounds: Normal breath sounds. No wheezing or rales.  Abdominal:     Palpations: Abdomen is soft.  Musculoskeletal:        General: Tenderness present.     Cervical back: Normal range of motion.  Lymphadenopathy:     Cervical: No cervical adenopathy.  Skin:    General: Skin is warm and dry.  Neurological:     Mental Status: She is alert and oriented to person, place, and time.     Vitals:   01/30/24 1041  BP: 122/80  Pulse: 71  Temp: 98.7 F (37.1 C)  TempSrc: Oral  SpO2: 98%  Weight: 181 lb (82.1 kg)  Height: 5\' 7"  (1.702 m)    Assessment &  Plan:

## 2024-02-02 ENCOUNTER — Encounter: Payer: Self-pay | Admitting: Internal Medicine

## 2024-02-02 ENCOUNTER — Other Ambulatory Visit: Payer: Self-pay | Admitting: Internal Medicine

## 2024-02-02 DIAGNOSIS — E039 Hypothyroidism, unspecified: Secondary | ICD-10-CM

## 2024-02-02 NOTE — Assessment & Plan Note (Signed)
 Rx medrol dose pack. No indication for antibiotics and encouraged to continue xyzal and consider to add flonase. Otc for cough.

## 2024-02-20 NOTE — Progress Notes (Unsigned)
   Acute Office Visit  Subjective:     Patient ID: [name], female    DOB: March 18, 1951, 73 y.o.   MRN: [id number]  No chief complaint on file.   HPI Patient is in today for evaluation of seasonal allergies.   ROS Per HPI      Objective:    There were no vitals taken for this visit.   Physical Exam Vitals and nursing note reviewed.  Constitutional:      Appearance: Normal appearance. She is normal weight.  HENT:     Head: Normocephalic and atraumatic.     Right Ear: Tympanic membrane and ear canal normal.     Left Ear: Tympanic membrane and ear canal normal.     Nose: Nose normal.  Eyes:     Extraocular Movements: Extraocular movements intact.     Pupils: Pupils are equal, round, and reactive to light.  Cardiovascular:     Rate and Rhythm: Normal rate and regular rhythm.     Heart sounds: Normal heart sounds.  Pulmonary:     Effort: Pulmonary effort is normal.     Breath sounds: Normal breath sounds.  Musculoskeletal:        General: Normal range of motion.     Cervical back: Normal range of motion.  Neurological:     General: No focal deficit present.     Mental Status: She is alert and oriented to person, place, and time.  Psychiatric:        Mood and Affect: Mood normal.        Thought Content: Thought content normal.   No results found for any visits on 02/21/24.      Assessment & Plan:   There are no diagnoses linked to this encounter.   No orders of the defined types were placed in this encounter.   No follow-ups on file.  Moshe Cipro, FNP

## 2024-02-21 ENCOUNTER — Ambulatory Visit (INDEPENDENT_AMBULATORY_CARE_PROVIDER_SITE_OTHER): Admitting: Family Medicine

## 2024-02-21 ENCOUNTER — Encounter: Payer: Self-pay | Admitting: Family Medicine

## 2024-02-21 VITALS — BP 124/78 | HR 81 | Temp 98.0°F | Ht 67.0 in | Wt 182.4 lb

## 2024-02-21 DIAGNOSIS — B9689 Other specified bacterial agents as the cause of diseases classified elsewhere: Secondary | ICD-10-CM | POA: Diagnosis not present

## 2024-02-21 DIAGNOSIS — J329 Chronic sinusitis, unspecified: Secondary | ICD-10-CM | POA: Diagnosis not present

## 2024-02-21 MED ORDER — AZITHROMYCIN 250 MG PO TABS: ORAL_TABLET | ORAL | 0 refills | Status: AC

## 2024-02-21 NOTE — Patient Instructions (Signed)
 I have sent in azithromycin for you to take.  Take 2 tablets today, then 1 tablet daily for the next 4 days.   Follow-up with me for new or worsening symptoms.

## 2024-02-27 ENCOUNTER — Ambulatory Visit (INDEPENDENT_AMBULATORY_CARE_PROVIDER_SITE_OTHER): Admitting: Family Medicine

## 2024-02-27 ENCOUNTER — Encounter: Payer: Self-pay | Admitting: Family Medicine

## 2024-02-27 ENCOUNTER — Ambulatory Visit: Payer: Self-pay

## 2024-02-27 VITALS — BP 138/68 | HR 76 | Temp 98.1°F | Resp 18 | Ht 67.0 in | Wt 182.0 lb

## 2024-02-27 DIAGNOSIS — R42 Dizziness and giddiness: Secondary | ICD-10-CM | POA: Diagnosis not present

## 2024-02-27 DIAGNOSIS — J302 Other seasonal allergic rhinitis: Secondary | ICD-10-CM

## 2024-02-27 LAB — CBC WITH DIFFERENTIAL/PLATELET
Basophils Absolute: 0 10*3/uL (ref 0.0–0.1)
Basophils Relative: 0.6 % (ref 0.0–3.0)
Eosinophils Absolute: 0.2 10*3/uL (ref 0.0–0.7)
Eosinophils Relative: 3 % (ref 0.0–5.0)
HCT: 39.7 % (ref 36.0–46.0)
Hemoglobin: 13.3 g/dL (ref 12.0–15.0)
Lymphocytes Relative: 36.7 % (ref 12.0–46.0)
Lymphs Abs: 2.5 10*3/uL (ref 0.7–4.0)
MCHC: 33.5 g/dL (ref 30.0–36.0)
MCV: 83.1 fl (ref 78.0–100.0)
Monocytes Absolute: 0.5 10*3/uL (ref 0.1–1.0)
Monocytes Relative: 6.7 % (ref 3.0–12.0)
Neutro Abs: 3.6 10*3/uL (ref 1.4–7.7)
Neutrophils Relative %: 53 % (ref 43.0–77.0)
Platelets: 298 10*3/uL (ref 150.0–400.0)
RBC: 4.78 Mil/uL (ref 3.87–5.11)
RDW: 14.5 % (ref 11.5–15.5)
WBC: 6.8 10*3/uL (ref 4.0–10.5)

## 2024-02-27 LAB — COMPREHENSIVE METABOLIC PANEL WITH GFR
ALT: 10 U/L (ref 0–35)
AST: 16 U/L (ref 0–37)
Albumin: 4.1 g/dL (ref 3.5–5.2)
Alkaline Phosphatase: 72 U/L (ref 39–117)
BUN: 11 mg/dL (ref 6–23)
CO2: 30 meq/L (ref 19–32)
Calcium: 9.7 mg/dL (ref 8.4–10.5)
Chloride: 105 meq/L (ref 96–112)
Creatinine, Ser: 0.81 mg/dL (ref 0.40–1.20)
GFR: 72.35 mL/min (ref >60.00)
Glucose, Bld: 96 mg/dL (ref 70–99)
Potassium: 4.1 meq/L (ref 3.5–5.1)
Sodium: 143 meq/L (ref 135–145)
Total Bilirubin: 1 mg/dL (ref 0.2–1.2)
Total Protein: 7.4 g/dL (ref 6.0–8.3)

## 2024-02-27 LAB — VITAMIN B12: Vitamin B-12: 290 pg/mL (ref 211–911)

## 2024-02-27 LAB — TSH: TSH: 1.13 u[IU]/mL (ref 0.35–5.50)

## 2024-02-27 MED ORDER — MONTELUKAST SODIUM 10 MG PO TABS: 10.0000 mg | ORAL_TABLET | Freq: Every day | ORAL | 2 refills | Status: DC

## 2024-02-27 MED ORDER — FLONASE SENSIMIST 27.5 MCG/SPRAY NA SUSP: 2.0000 | Freq: Every day | NASAL | 0 refills | Status: AC

## 2024-02-27 MED ORDER — FEXOFENADINE HCL 180 MG PO TABS: 180.0000 mg | ORAL_TABLET | Freq: Every day | ORAL | 0 refills | Status: DC

## 2024-02-27 NOTE — Telephone Encounter (Signed)
 Copied from CRM [id number]. Topic: Clinical - Red Word Triage >> Feb 27, 2024  8:21 AM Fredrich Romans wrote: Red Word that prompted transfer to Nurse Triage: get dizzy and black out when going outside   Chief Complaint: Dizziness  Symptoms: Coughing, Neil Crouch  Frequency: Since Thursday  Pertinent Negatives: Patient denies fever, fainting, room spinning, chest pain, or dyspnea.   Disposition: [] ED /[] Urgent Care (no appt availability in office) / [x] Appointment(In office/virtual)/ []  [facility]/ [] Home Care/ [] Refused Recommended Disposition /[] [facility]/ []  Follow-up with PCP Additional Notes: RC is being triaged for dizziness that has been going on since Last Thursday. The episodes last 5-10 minutes at a time. The patient states she has experienced these episodes frequently for the past week. In office appointment made for this morning.   Reason for Disposition  [1] MODERATE dizziness (e.g., interferes with normal activities) AND [2] has NOT been evaluated by doctor (or NP/PA) for this  (Exception: Dizziness caused by heat exposure, sudden standing, or poor fluid intake.)  Answer Assessment - Initial Assessment Questions 1. DESCRIPTION: "Describe your dizziness."     "Feels like like I'm going to fall."  2. LIGHTHEADED: "Do you feel lightheaded?" (e.g., somewhat faint, woozy, weak upon standing)     Weakeness, Woozy  3. VERTIGO: "Do you feel like either you or the room is spinning or tilting?" (i.e. vertigo)     Denies  4. SEVERITY: "How bad is it?"  "Do you feel like you are going to faint?" "Can you stand and walk?"   - MILD: Feels slightly dizzy, but walking normally.   - MODERATE: Feels unsteady when walking, but not falling; interferes with normal activities (e.g., school, work).   - SEVERE: Unable to walk without falling, or requires assistance to walk without falling; feels like passing out now.      Moderate to Severe  5. ONSET:  "When did the dizziness  begin?"     Since Last Thursday  6. AGGRAVATING FACTORS: "Does anything make it worse?" (e.g., standing, change in head position)     Going outside (Maybe Related to Pollen)  7. HEART RATE: "Can you tell me your heart rate?" "How many beats in 15 seconds?"  (Note: not all patients can do this)       Unsure  8. CAUSE: "What do you think is causing the dizziness?"     Allergies  9. RECURRENT SYMPTOM: "Have you had dizziness before?" If Yes, ask: "When was the last time?" "What happened that time?"     No  10. OTHER SYMPTOMS: "Do you have any other symptoms?" (e.g., fever, chest pain, vomiting, diarrhea, bleeding)       Coughing, Runny Nose  11. PREGNANCY: "Is there any chance you are pregnant?" "When was your last menstrual period?"       No and No  Protocols used: Dizziness - Lightheadedness-A-AH

## 2024-02-27 NOTE — Patient Instructions (Signed)
 Stop Levocetirizine (Xyzal).  Start [employer]	[place], Allegra, and [employer].

## 2024-02-27 NOTE — Progress Notes (Signed)
 Assessment & Plan:  1. Seasonal allergies (Primary) Education provided on allergies. Switching Xyzal to Allegra per patients request. Start Flonase Sensimist and Singular. Increase water intake.  - fluticasone (FLONASE SENSIMIST) 27.5 MCG/SPRAY nasal spray; Place 2 sprays into the nose daily.  Dispense: 10 g; Refill: 0 - montelukast (SINGULAIR) 10 MG tablet; Take 1 tablet (10 mg total) by mouth at bedtime.  Dispense: 30 tablet; Refill: 2 - fexofenadine (ALLEGRA) 180 MG tablet; Take 1 tablet (180 mg total) by mouth daily.  Dispense: 90 tablet; Refill: 0  2. Dizziness Labs to rule out other causes.  - CBC with Differential/Platelet - Comprehensive metabolic panel with GFR - TSH - Vitamin B12   Follow up plan: Return if symptoms worsen or fail to improve.  Deliah Boston, MSN, APRN, FNP-C  Subjective:  HPI: [name] is a 73 y.o. female presenting on 02/27/2024 for Cough (Chest cold, cough sometimes productive, weakness, tired, occasional dizziness, no fever - sick off/ on for about 1 month, but got worse on Thursday)  Patient reports feeling foggy headed and dizziness that makes her feel like she is going to fall. Symptoms occurred four days ago and again yesterday when she was outside. Additional symptoms include coughing, runny nose, sneezing, and weakness. Denies spinning. She is taking levocetirizine daily. She has Flonase, but is not using it due to the taste. She reports drinking 3-4 bottles of water per day. She eats a small breakfast and eats a healthy snack between meals.    ROS: Negative unless specifically indicated above in HPI.   Relevant past medical history reviewed and updated as indicated.   Allergies and medications reviewed and updated.   Current Outpatient Medications:    amLODipine (NORVASC) 5 MG tablet, Take 1 tablet (5 mg total) by mouth every morning., Disp: 90 tablet, Rfl: 3   carvedilol (COREG) 6.25 MG tablet, Take 1 tablet (6.25 mg total) by mouth 2  (two) times daily with a meal., Disp: 180 tablet, Rfl: 3   clonazePAM (KLONOPIN) 0.5 MG tablet, Take 1 tablet (0.5 mg total) by mouth 2 (two) times daily as needed for anxiety., Disp: 180 tablet, Rfl: 1   clopidogrel (PLAVIX) 75 MG tablet, Take 1 tablet (75 mg total) by mouth every morning., Disp: 90 tablet, Rfl: 3   famotidine (PEPCID) 40 MG tablet, TAKE 1 TABLET BY MOUTH EVERY MORNING, Disp: 90 tablet, Rfl: 1   isosorbide mononitrate (IMDUR) 30 MG 24 hr tablet, Take 1 tablet (30 mg total) by mouth every morning., Disp: 90 tablet, Rfl: 3   levocetirizine (XYZAL) 5 MG tablet, TAKE 1 TABLET BY MOUTH HHS, Disp: 90 tablet, Rfl: 1   levothyroxine (SYNTHROID) 100 MCG tablet, TAKE 1 TABLET BY MOUTH EVERY DAY BEFORE BREAKFAST, Disp: 90 tablet, Rfl: 1   losartan (COZAAR) 25 MG tablet, Take 1 tablet (25 mg total) by mouth every morning., Disp: 90 tablet, Rfl: 3   magnesium oxide (MAG-OX) 400 MG tablet, Take 400 mg by mouth daily., Disp: , Rfl:    pantoprazole (PROTONIX) 20 MG tablet, TAKE 1 TABLET BY MOUTH EVERY MORNING, Disp: 90 tablet, Rfl: 1   rosuvastatin (CRESTOR) 10 MG tablet, Take 1 tablet (10 mg total) by mouth at bedtime., Disp: 90 tablet, Rfl: 3   Vitamin D, Ergocalciferol, (DRISDOL) 1.25 MG (50000 UNIT) CAPS capsule, TAKE 1 CAPSULE BY MOUTH ONE TIME PER WEEK, Disp: 12 capsule, Rfl: 0   linaclotide (LINZESS) 145 MCG CAPS capsule, Take 1 capsule (145 mcg total) by mouth daily  before breakfast. (Patient not taking: Reported on 02/27/2024), Disp: 90 capsule, Rfl: 1   nitroGLYCERIN (NITROSTAT) 0.4 MG SL tablet, Place 1 tablet (0.4 mg total) under the tongue every 5 (five) minutes as needed for chest pain. (Patient not taking: Reported on 02/27/2024), Disp: 25 tablet, Rfl: 6  Allergies  Allergen Reactions   Aspirin Rash    unknown   Gadolinium Hives    patient unsure of which kind of dye she had a reaction to until reaction to Magnevist - may be allergic to x-ray contrast, Onset Date: 13244010     Iohexol Hives    patient unsure of which kind of dye she had a reaction to until reaction to Magnevist - may be allergic to x-ray contrast, Onset Date: 27253664   Livalo [Pitavastatin] Other (See Comments)    myalgias   Tramadol Hcl Nausea Only    Severe GI upset per pt   Hydrocodone-Acetaminophen Rash   Lipitor [Atorvastatin] Other (See Comments)    Myalgias   Oxycodone Itching    Objective:   BP 138/68   Pulse 76   Temp 98.1 F (36.7 C)   Resp 18   Ht 5\' 7"  (1.702 m)   Wt 182 lb (82.6 kg)   SpO2 96%   BMI 28.51 kg/m    Physical Exam Vitals reviewed.  Constitutional:      General: She is not in acute distress.    Appearance: Normal appearance. She is not ill-appearing, toxic-appearing or diaphoretic.  HENT:     Head: Normocephalic and atraumatic.     Right Ear: Tympanic membrane, ear canal and external ear normal. There is no impacted cerumen.     Left Ear: Tympanic membrane, ear canal and external ear normal. There is no impacted cerumen.     Nose: Nose normal. No congestion or rhinorrhea.     Mouth/Throat:     Mouth: Mucous membranes are moist.     Pharynx: Oropharynx is clear. No oropharyngeal exudate or posterior oropharyngeal erythema.  Eyes:     General: No scleral icterus.       Right eye: No discharge.        Left eye: No discharge.     Conjunctiva/sclera: Conjunctivae normal.  Cardiovascular:     Rate and Rhythm: Normal rate and regular rhythm.     Heart sounds: Normal heart sounds. No murmur heard.    No friction rub. No gallop.  Pulmonary:     Effort: Pulmonary effort is normal. No respiratory distress.     Breath sounds: Normal breath sounds. No stridor. No wheezing, rhonchi or rales.  Musculoskeletal:        General: Normal range of motion.     Cervical back: Normal range of motion.  Skin:    General: Skin is warm and dry.     Capillary Refill: Capillary refill takes less than 2 seconds.  Neurological:     General: No focal deficit present.      Mental Status: She is alert and oriented to person, place, and time. Mental status is at baseline.  Psychiatric:        Mood and Affect: Mood normal.        Behavior: Behavior normal.        Thought Content: Thought content normal.        Judgment: Judgment normal.

## 2024-03-05 ENCOUNTER — Telehealth: Payer: Self-pay

## 2024-03-05 NOTE — Telephone Encounter (Signed)
 [facility] Pre-operative Risk Assessment     Request for surgical clearance:     Endoscopy Procedure  What type of surgery is being performed?     colonoscopy  When is this surgery scheduled?     04/09/24  What type of clearance is required ?   Pharmacy  Are there any medications that need to be held prior to surgery and how long? Plavix for 5 days  Practice name and name of physician performing surgery?      [facility]  What is your office phone and fax number?      Phone- [phone]  Fax- [phone]  Anesthesia type (None, local, MAC, general) ?       MAC   Please route your response to Selinda Michaels RN

## 2024-03-05 NOTE — Telephone Encounter (Signed)
 Please obtain cardiac clearance from prescribing physician for Plavix.  Colonoscopy scheduled for 04-09-24 with Dr. Rhea Belton.

## 2024-03-05 NOTE — Telephone Encounter (Signed)
 Note sent to hold plavix for 5 days.

## 2024-03-06 ENCOUNTER — Telehealth: Payer: Self-pay | Admitting: *Deleted

## 2024-03-06 NOTE — Telephone Encounter (Signed)
 Pt has been scheduled tele preop appt 03/19/24. Med rec and consent are done.     Patient Consent for Virtual Visit        [name] has provided verbal consent on 03/06/2024 for a virtual visit (video or telephone).   CONSENT FOR VIRTUAL VISIT FOR:  [name]  By participating in this virtual visit I agree to the following:  I hereby voluntarily request, consent and authorize [facility] and its employed or contracted physicians, physician assistants, nurse practitioners or other licensed health care professionals (the Practitioner), to provide me with telemedicine health care services (the "Services") as deemed necessary by the treating Practitioner. I acknowledge and consent to receive the Services by the Practitioner via telemedicine. I understand that the telemedicine visit will involve communicating with the Practitioner through live audiovisual communication technology and the disclosure of certain medical information by electronic transmission. I acknowledge that I have been given the opportunity to request an in-person assessment or other available alternative prior to the telemedicine visit and am voluntarily participating in the telemedicine visit.  I understand that I have the right to withhold or withdraw my consent to the use of telemedicine in the course of my care at any time, without affecting my right to future care or treatment, and that the Practitioner or I may terminate the telemedicine visit at any time. I understand that I have the right to inspect all information obtained and/or recorded in the course of the telemedicine visit and may receive copies of available information for a reasonable fee.  I understand that some of the potential risks of receiving the Services via telemedicine include:  Delay or interruption in medical evaluation due to technological equipment failure or disruption; Information transmitted may not be sufficient (e.g. poor resolution of  images) to allow for appropriate medical decision making by the Practitioner; and/or  In rare instances, security protocols could fail, causing a breach of personal health information.  Furthermore, I acknowledge that it is my responsibility to provide information about my medical history, conditions and care that is complete and accurate to the best of my ability. I acknowledge that Practitioner's advice, recommendations, and/or decision may be based on factors not within their control, such as incomplete or inaccurate data provided by me or distortions of diagnostic images or specimens that may result from electronic transmissions. I understand that the practice of medicine is not an exact science and that Practitioner makes no warranties or guarantees regarding treatment outcomes. I acknowledge that a copy of this consent can be made available to me via my patient portal [facility] MyChart), or I can request a printed copy by calling the office of [facility].    I understand that my insurance will be billed for this visit.   I have read or had this consent read to me. I understand the contents of this consent, which adequately explains the benefits and risks of the Services being provided via telemedicine.  I have been provided ample opportunity to ask questions regarding this consent and the Services and have had my questions answered to my satisfaction. I give my informed consent for the services to be provided through the use of telemedicine in my medical care

## 2024-03-06 NOTE — Telephone Encounter (Signed)
 Pt has been scheduled tele preop appt 03/19/24. Med rec and consent are done.

## 2024-03-06 NOTE — Telephone Encounter (Signed)
   Name: [name]  DOB: [birth year]  MRN: [id number]  Primary Cardiologist: Tonny Bollman, MD   Preoperative team, please contact this patient and set up a phone call appointment for further preoperative risk assessment. Please obtain consent and complete medication review. Thank you for your help.  I confirm that guidance regarding antiplatelet and oral anticoagulation therapy has been completed and, if necessary, noted below.  Per office protocol, if patient is without any new symptoms or concerns at the time of their virtual visit, she may hold Plavix for 5 days prior to procedure. Please resume Plavix as soon as possible postprocedure, at the discretion of the surgeon.    I also confirmed the patient resides in the state of [place]. As per [facility] Medical Board telemedicine laws, the patient must reside in the state in which the provider is licensed.   Denyce Robert, NP 03/06/2024, 8:47 AM [facility]

## 2024-03-19 ENCOUNTER — Other Ambulatory Visit: Payer: Self-pay | Admitting: Internal Medicine

## 2024-03-19 ENCOUNTER — Ambulatory Visit: Attending: Cardiovascular Disease | Admitting: Nurse Practitioner

## 2024-03-19 DIAGNOSIS — E559 Vitamin D deficiency, unspecified: Secondary | ICD-10-CM

## 2024-03-19 DIAGNOSIS — Z01810 Encounter for preprocedural cardiovascular examination: Secondary | ICD-10-CM

## 2024-03-19 NOTE — Progress Notes (Signed)
 Virtual Visit via Telephone Note   Because of [name] co-morbid illnesses, she is at least at moderate risk for complications without adequate follow up.  This format is felt to be most appropriate for this patient at this time.  Due to technical limitations with video connection (technology), today's appointment will be conducted as an audio only telehealth visit, and [name] verbally agreed to proceed in this manner.   All issues noted in this document were discussed and addressed.  No physical exam could be performed with this format.  Evaluation Performed:  Preoperative cardiovascular risk assessment _____________   Date:  03/19/2024   Patient ID:  [name], DOB May 20, 1951, MRN [id number] Patient Location:  Home Provider location:   Office  Primary Care Provider:  Arcadio Knuckles, MD Primary Cardiologist:  Arnoldo Lapping, MD  Chief Complaint / Patient Profile   73 y.o. y/o female with a h/o nonobstructive CAD, Takotsubo cardiomyopathy with recovered EF, hypertension, and hyperlipidemia who is pending colonoscopy on 04/09/2024 with [facility] and presents today for telephonic preoperative cardiovascular risk assessment.  History of Present Illness    [name] is a 73 y.o. female who presents via audio/video conferencing for a telehealth visit today.  Pt was last seen in cardiology clinic on 07/26/2023 by Dr. Arlester Ladd. At that time [name] was doing well. The patient is now pending procedure as outlined above. Since her last visit, she has done well from a cardiac standpoint.   She denies chest pain, palpitations, dyspnea, pnd, orthopnea, n, v, dizziness, syncope, edema, weight gain, or early satiety. All other systems reviewed and are otherwise negative except as noted above.   Past Medical History    Past Medical History:  Diagnosis Date   Anemia    Arthritis    Cardiomyopathy (HCC)    Cholelithiasis    Chronic back pain    Constipation    Esophageal  reflux    Fibromyalgia    Hemorrhoids    Hyperlipidemia    Hypertension    Hypothyroid    IBS (irritable bowel syndrome)    Internal hemorrhoids without mention of complication    Left leg pain    Myocardial infarction [facility])    Personal history of colonic polyps 05/06/2010   ADENOMATOUS POLYP   Pneumonia    Tubular adenoma of colon    Past Surgical History:  Procedure Laterality Date   ABDOMINAL HYSTERECTOMY     CERVICAL DISC SURGERY     PLATE IN NECK & BACK   CHOLECYSTECTOMY     COLONOSCOPY  09/27/2011   NORMAL    COLONOSCOPY WITH PROPOFOL  N/A 10/06/2020   Procedure: COLONOSCOPY WITH PROPOFOL ;  Surgeon: Normie Becton., MD;  Location: [facility] ENDOSCOPY;  Service: Gastroenterology;  Laterality: N/A;  ultra slim scope    LEFT HEART CATH AND CORONARY ANGIOGRAPHY N/A 05/08/2018   Procedure: LEFT HEART CATH AND CORONARY ANGIOGRAPHY;  Surgeon: Arty Binning, MD;  Location: [facility] INVASIVE CV LAB;  Service: Cardiovascular;  Laterality: N/A;   LUMBAR DISC SURGERY     POLYPECTOMY  10/06/2020   Procedure: POLYPECTOMY;  Surgeon: Mansouraty, Albino Alu., MD;  Location: [facility] ENDOSCOPY;  Service: Gastroenterology;;   TONSILLECTOMY      Allergies  Allergies  Allergen Reactions   Aspirin Rash    unknown   Gadolinium Hives    patient unsure of which kind of dye she had a reaction to until reaction to Magnevist - may be allergic  to x-ray contrast, Onset Date: [id number]    Iohexol  Hives    patient unsure of which kind of dye she had a reaction to until reaction to Magnevist - may be allergic to x-ray contrast, Onset Date: [id number]   Livalo  [Pitavastatin ] Other (See Comments)    myalgias   Tramadol  Hcl Nausea Only    Severe GI upset per pt   Hydrocodone -Acetaminophen  Rash   Lipitor [Atorvastatin] Other (See Comments)    Myalgias   Oxycodone  Itching    Home Medications    Prior to Admission medications   Medication Sig Start Date End Date Taking? Authorizing Provider  amLODipine   (NORVASC ) 5 MG tablet Take 1 tablet (5 mg total) by mouth every morning. 07/26/23   Arnoldo Lapping, MD  carvedilol  (COREG ) 6.25 MG tablet Take 1 tablet (6.25 mg total) by mouth 2 (two) times daily with a meal. 07/26/23   Arnoldo Lapping, MD  clonazePAM  (KLONOPIN ) 0.5 MG tablet Take 1 tablet (0.5 mg total) by mouth 2 (two) times daily as needed for anxiety. 03/17/22   Arcadio Knuckles, MD  clopidogrel  (PLAVIX ) 75 MG tablet Take 1 tablet (75 mg total) by mouth every morning. 07/26/23   Arnoldo Lapping, MD  famotidine  (PEPCID ) 40 MG tablet TAKE 1 TABLET BY MOUTH EVERY MORNING 01/30/24   Arcadio Knuckles, MD  fexofenadine  (ALLEGRA ) 180 MG tablet Take 1 tablet (180 mg total) by mouth daily. 02/27/24   Joyce, Britney F, FNP  fluticasone  (FLONASE  SENSIMIST) 27.5 MCG/SPRAY nasal spray Place 2 sprays into the nose daily. 02/27/24   Zorita Hiss, FNP  isosorbide  mononitrate (IMDUR ) 30 MG 24 hr tablet Take 1 tablet (30 mg total) by mouth every morning. 07/26/23   Arnoldo Lapping, MD  levothyroxine  (SYNTHROID ) 100 MCG tablet TAKE 1 TABLET BY MOUTH EVERY DAY BEFORE BREAKFAST 02/02/24   Arcadio Knuckles, MD  linaclotide  (LINZESS ) 145 MCG CAPS capsule Take 1 capsule (145 mcg total) by mouth daily before breakfast. Patient not taking: Reported on 03/06/2024 03/16/23   Arcadio Knuckles, MD  losartan  (COZAAR ) 25 MG tablet Take 1 tablet (25 mg total) by mouth every morning. 07/26/23   Arnoldo Lapping, MD  magnesium  oxide (MAG-OX) 400 MG tablet Take 400 mg by mouth daily.    [provider]  montelukast  (SINGULAIR ) 10 MG tablet Take 1 tablet (10 mg total) by mouth at bedtime. 02/27/24   Zorita Hiss, FNP  nitroGLYCERIN  (NITROSTAT ) 0.4 MG SL tablet Place 1 tablet (0.4 mg total) under the tongue every 5 (five) minutes as needed for chest pain. Patient not taking: Reported on 02/27/2024 03/04/20   Arnoldo Lapping, MD  pantoprazole  (PROTONIX ) 20 MG tablet TAKE 1 TABLET BY MOUTH EVERY MORNING 01/30/24   Arcadio Knuckles, MD   rosuvastatin  (CRESTOR ) 10 MG tablet Take 1 tablet (10 mg total) by mouth at bedtime. 07/26/23   Arnoldo Lapping, MD  Vitamin D , Ergocalciferol , (DRISDOL ) 1.25 MG (50000 UNIT) CAPS capsule TAKE 1 CAPSULE BY MOUTH ONE TIME PER WEEK 12/18/23   Arcadio Knuckles, MD    Physical Exam    Vital Signs:  AMESHA BAILEY does not have vital signs available for review today.  Given telephonic nature of communication, physical exam is limited. AAOx3. NAD. Normal affect.  Speech and respirations are unlabored.  Accessory Clinical Findings    None  Assessment & Plan    1.  Preoperative Cardiovascular Risk Assessment:  According to the Revised Cardiac Risk Index (RCRI), her Perioperative Risk of Major Cardiac  Event is (%): 0.4. Her Functional Capacity in METs is: 6.61 according to the Duke Activity Status Index (DASI).Therefore, based on ACC/AHA guidelines, patient would be at acceptable risk for the planned procedure without further cardiovascular testing.  The patient was advised that if she develops new symptoms prior to surgery to contact our office to arrange for a follow-up visit, and she verbalized understanding.  Per office protocol, she may hold Plavix  for 5 days prior to procedure. Please resume Plavix  as soon as possible postprocedure, at the discretion of the surgeon.    A copy of this note will be routed to requesting surgeon.  Time:   Today, I have spent 5 minutes with the patient with telehealth technology discussing medical history, symptoms, and management plan.     Jude Norton, NP  03/19/2024, 9:30 AM

## 2024-03-20 ENCOUNTER — Ambulatory Visit (AMBULATORY_SURGERY_CENTER): Payer: HMO

## 2024-03-20 VITALS — Ht 67.0 in | Wt 180.0 lb

## 2024-03-20 DIAGNOSIS — Z860100 Personal history of colon polyps, unspecified: Secondary | ICD-10-CM

## 2024-03-20 MED ORDER — NA SULFATE-K SULFATE-MG SULF 17.5-3.13-1.6 GM/177ML PO SOLN
1.0000 | Freq: Once | ORAL | 0 refills | Status: AC
Start: 2024-03-20 — End: 2024-03-20

## 2024-03-20 NOTE — Progress Notes (Signed)
 Pre visit completed via phone call; Patient verified name, DOB, and address; No egg or soy allergy known to patient;  No issues known to pt with past sedation with any surgeries or procedures; Patient denies ever being told they had issues or difficulty with intubation;  No FH of Malignant Hyperthermia; Pt is not on diet pills; Pt is not on home 02;  Pt is not on blood thinners;  Pt denies issues with constipation as long as she drinks hot coffee and a cup of water and she has a bowel movement daily- will take Linzess  if needed to ensure she is not constipated prior to starting prep; No A fib or A flutter; Have any cardiac testing pending--NO Insurance verified during PV appt--- H[employer] Medicare  Pt can ambulate without assistance;  Pt denies use of chewing tobacco; Discussed diabetic/weight loss medication holds; Discussed NSAID holds; Checked BMI to be less than 50; Pt instructed to use [employer] or GoodRx for a price reduction on prep; Patient's chart reviewed by Rogena Class CNRA prior to previsit and patient appropriate for the [facility];  Pre visit completed and red dot placed by patient's name on their procedure day (on provider's schedule);   Instructions printed and given to patient during Pre Visit appt;

## 2024-03-20 NOTE — Telephone Encounter (Signed)
 See virtual telephone note dated 03/19/24, pt ok for porcedure and to hold plavix  for 5 days prior to procedure. Pt aware.

## 2024-03-28 ENCOUNTER — Encounter: Payer: Self-pay | Admitting: Internal Medicine

## 2024-03-29 ENCOUNTER — Encounter: Payer: Self-pay | Admitting: Family Medicine

## 2024-03-29 ENCOUNTER — Ambulatory Visit: Admitting: Family Medicine

## 2024-03-29 VITALS — BP 120/84 | HR 91 | Temp 98.7°F | Ht 67.0 in | Wt 182.8 lb

## 2024-03-29 DIAGNOSIS — R0981 Nasal congestion: Secondary | ICD-10-CM

## 2024-03-29 DIAGNOSIS — U071 COVID-19: Secondary | ICD-10-CM

## 2024-03-29 DIAGNOSIS — R051 Acute cough: Secondary | ICD-10-CM

## 2024-03-29 LAB — POC COVID19 BINAXNOW: SARS Coronavirus 2 Ag: POSITIVE — AB

## 2024-03-29 MED ORDER — NIRMATRELVIR/RITONAVIR (PAXLOVID)TABLET: 3.0000 | ORAL_TABLET | Freq: Two times a day (BID) | ORAL | 0 refills | Status: AC

## 2024-03-29 MED ORDER — HYDROCODONE BIT-HOMATROP MBR 5-1.5 MG/5ML PO SOLN
5.0000 mL | Freq: Three times a day (TID) | ORAL | 0 refills | Status: DC | PRN
Start: 2024-03-29 — End: 2024-07-04

## 2024-03-29 NOTE — Patient Instructions (Addendum)
 COVID test today was positive.   I have sent in Paxlovid  for you to take twice a day for 5 days.    I recommend you eat with this medication, as it can upset your stomach if you do not.  This medication can sometimes leave a bad taste in your mouth, it is okay to use mints, gum, to help combat this.   If this occurs, it is temporary and will resolve once the medication course is completed.  There is a minor risk of rebound COVID after taking Paxlovid .  Continue supportive care at home, make sure you are drinking plenty of fluids and getting some rest.  May take ibuprofen  and Tylenol  as needed for fever, aches and pains.  Stay home and away from everyone until you are 24 hours fever free without fever reducing medications.  Follow-up with me if symptoms are persisting over the next week.  Follow-up in the emergency room if you are experiencing shortness of breath, high fever, unremitting cough, severe fatigue, other concerning symptoms.  DO NOT take clopidogrel  or Crestor  while taking paxlovid 

## 2024-03-29 NOTE — Progress Notes (Signed)
 Acute Office Visit  Subjective:     Patient ID: [name], female    DOB: May 05, 1951, 73 y.o.   MRN: [id number]  Chief Complaint  Patient presents with   Acute Visit    Body aches, throat feels raw, headaches, dizzy, dry cough. Started last night    HPI Patient is in today for evaluation of bodyaches, sore throat, headaches, dizziness, cough, fatigue, for the last night. Has tried OTC medications with no relief. Denies known sick contacts. Denies abdominal pain, nausea, vomiting, diarrhea, rash, fever, other symptoms.  Medical hx as outlined below. Was seen by me on 02/21/2024 and was treated with azithromycin .  Reports that that illness has since resolved. Denies other concerns today. Medical history as outlined below.    ROS Per HPI      Objective:    BP 120/84 (BP Location: Left Arm, Patient Position: Sitting)   Pulse 91   Temp 98.7 F (37.1 C) (Temporal)   Ht 5\' 7"  (1.702 m)   Wt 182 lb 12.8 oz (82.9 kg)   SpO2 93%   BMI 28.63 kg/m    Physical Exam Vitals and nursing note reviewed.  Constitutional:      General: She is not in acute distress.    Appearance: She is ill-appearing.     Comments: Appears fatigued  HENT:     Head: Normocephalic and atraumatic.     Right Ear: External ear normal.     Left Ear: External ear normal.     Nose: Congestion present.     Mouth/Throat:     Mouth: Mucous membranes are moist.     Pharynx: Oropharynx is clear. Posterior oropharyngeal erythema present. No oropharyngeal exudate.     Comments: Oropharyngeal cobblestoning   Eyes:     Extraocular Movements: Extraocular movements intact.     Comments: Clear discharge bilateral  Cardiovascular:     Rate and Rhythm: Normal rate and regular rhythm.     Heart sounds: Normal heart sounds.  Pulmonary:     Effort: Pulmonary effort is normal. No respiratory distress.     Breath sounds: No wheezing, rhonchi or rales.  Musculoskeletal:     Cervical back: Normal range of  motion and neck supple.  Lymphadenopathy:     Cervical: Cervical adenopathy present.  Skin:    General: Skin is warm and dry.  Neurological:     General: No focal deficit present.     Mental Status: She is alert and oriented to person, place, and time.     Results for orders placed or performed in visit on 03/29/24  POC COVID-19 BinaxNow  Result Value Ref Range   SARS Coronavirus 2 Ag Positive (A) Negative        Assessment & Plan:   Nasal congestion -     nirmatrelvir /ritonavir ; Take 3 tablets by mouth 2 (two) times daily for 5 days. (Take nirmatrelvir  150 mg two tablets twice daily for 5 days and ritonavir  100 mg one tablet twice daily for 5 days) Patient GFR is 72.35  Dispense: 30 tablet; Refill: 0 -     POC COVID-19 BinaxNow  Acute cough -     nirmatrelvir /ritonavir ; Take 3 tablets by mouth 2 (two) times daily for 5 days. (Take nirmatrelvir  150 mg two tablets twice daily for 5 days and ritonavir  100 mg one tablet twice daily for 5 days) Patient GFR is 72.35  Dispense: 30 tablet; Refill: 0 -     POC COVID-19 BinaxNow -  HYDROcodone  Bit-Homatrop MBr; Take 5 mLs by mouth every 8 (eight) hours as needed for cough.  Dispense: 120 mL; Refill: 0  COVID-19 -     nirmatrelvir /ritonavir ; Take 3 tablets by mouth 2 (two) times daily for 5 days. (Take nirmatrelvir  150 mg two tablets twice daily for 5 days and ritonavir  100 mg one tablet twice daily for 5 days) Patient GFR is 72.35  Dispense: 30 tablet; Refill: 0 -     HYDROcodone  Bit-Homatrop MBr; Take 5 mLs by mouth every 8 (eight) hours as needed for cough.  Dispense: 120 mL; Refill: 0  Discussed isolation precautions  Will Rx Paxlovid  given high risk of severe disease and complication from COVID due to history MI, HTN, HLD  Discussed to stop clopidogrel  and Crestor  while taking Paxlovid   Discussed to follow-up with me next week if she is not starting to feel better  Meds ordered this encounter  Medications    nirmatrelvir /ritonavir  (PAXLOVID ) 20 x 150 MG & 10 x 100MG  TABS    Sig: Take 3 tablets by mouth 2 (two) times daily for 5 days. (Take nirmatrelvir  150 mg two tablets twice daily for 5 days and ritonavir  100 mg one tablet twice daily for 5 days) Patient GFR is 72.35    Dispense:  30 tablet    Refill:  0   HYDROcodone  bit-homatropine (HYCODAN) 5-1.5 MG/5ML syrup    Sig: Take 5 mLs by mouth every 8 (eight) hours as needed for cough.    Dispense:  120 mL    Refill:  0    Return if symptoms worsen or fail to improve.  Wellington Half, FNP

## 2024-03-30 ENCOUNTER — Telehealth: Payer: Self-pay | Admitting: Internal Medicine

## 2024-03-30 NOTE — Telephone Encounter (Signed)
 PT found out she is positive for covid as of 5/1 and wants to know will she be able to proceed with the procedure on 5/12. Please advise.

## 2024-04-02 ENCOUNTER — Telehealth: Payer: Self-pay

## 2024-04-02 ENCOUNTER — Encounter (HOSPITAL_COMMUNITY): Payer: Self-pay | Admitting: Internal Medicine

## 2024-04-02 NOTE — Telephone Encounter (Signed)
 Pt has to be 4 weeks out from dx and 2 weeks symptom free. Let pt know procedure has to be cancelled. Let her know we will reschedule once next hospital date available. Hosp has cancelled the procedure. Dr Bridgett Camps notified.

## 2024-04-02 NOTE — Telephone Encounter (Signed)
 Procedure:colon Procedure date: 04/09/24 Procedure location: wl Arrival Time: 845 am Spoke with the patient Y/N: I left a detail message @356  pm to call to confirm her appt. Any prep concerns? .  Has the patient obtained the prep from the pharmacy ? Aaron Aas Do you have a care partner and transportation: . Any additional concerns? Aaron Aas

## 2024-04-06 ENCOUNTER — Ambulatory Visit: Payer: Self-pay

## 2024-04-06 ENCOUNTER — Encounter: Payer: Self-pay | Admitting: Family Medicine

## 2024-04-06 ENCOUNTER — Other Ambulatory Visit: Payer: Self-pay | Admitting: Family Medicine

## 2024-04-06 ENCOUNTER — Ambulatory Visit (INDEPENDENT_AMBULATORY_CARE_PROVIDER_SITE_OTHER): Admitting: Family Medicine

## 2024-04-06 VITALS — BP 132/88 | HR 74 | Temp 98.4°F | Ht 67.0 in | Wt 177.0 lb

## 2024-04-06 DIAGNOSIS — E559 Vitamin D deficiency, unspecified: Secondary | ICD-10-CM | POA: Diagnosis not present

## 2024-04-06 DIAGNOSIS — J208 Acute bronchitis due to other specified organisms: Secondary | ICD-10-CM | POA: Diagnosis not present

## 2024-04-06 DIAGNOSIS — B9689 Other specified bacterial agents as the cause of diseases classified elsewhere: Secondary | ICD-10-CM | POA: Insufficient documentation

## 2024-04-06 MED ORDER — BENZONATATE 100 MG PO CAPS
200.0000 mg | ORAL_CAPSULE | Freq: Three times a day (TID) | ORAL | 0 refills | Status: DC | PRN
Start: 2024-04-06 — End: 2024-07-04

## 2024-04-06 MED ORDER — AZITHROMYCIN 250 MG PO TABS: ORAL_TABLET | ORAL | 0 refills | Status: DC

## 2024-04-06 MED ORDER — ERGOCALCIFEROL 50 MCG (2000 UT) PO TABS: 2000.0000 [IU] | ORAL_TABLET | Freq: Every day | ORAL | 1 refills | Status: DC

## 2024-04-06 NOTE — Telephone Encounter (Signed)
 Copied from CRM [id number]. Topic: Clinical - Red Word Triage >> Apr 06, 2024  8:30 AM Allyne Areola wrote: Red Word that prompted transfer to Nurse Triage: Patient is calling because she has been feeling under the weather since last night, She is feeling extremely weak, asked if she was experiencing any other symptoms but patient just states she feels extremely bad.  Chief Complaint: fatigue, weakness, recent diagnosis of Covid Symptoms: see above Frequency: constant and since yesterday Pertinent Negatives: Patient denies fever, cp, sob Disposition: [] ED /[] Urgent Care (no appt availability in office) / [x] Appointment(In office/virtual)/ []  [facility]/ [] Home Care/ [] Refused Recommended Disposition /[] [facility]/ []  Follow-up with PCP Additional Notes: apt made per protocol ; care advice given, denies questions; instructed to go to ER if becomes worse.   Reason for Disposition  [1] MODERATE weakness (i.e., interferes with work, school, normal activities) AND [2] cause unknown  (Exceptions: Weakness from acute minor illness or poor fluid intake; weakness is chronic and not worse.)  Answer Assessment - Initial Assessment Questions 1. DESCRIPTION: "Describe how you are feeling."     Weak and not feeling well 2. SEVERITY: "How bad is it?"  "Can you stand and walk?"   - MILD (0-3): Feels weak or tired, but does not interfere with work, school or normal activities.   - MODERATE (4-7): Able to stand and walk; weakness interferes with work, school, or normal activities.   - SEVERE (8-10): Unable to stand or walk; unable to do usual activities.     moderate 3. ONSET: "When did these symptoms begin?" (e.g., hours, days, weeks, months)     Last night and getting over covid 4. CAUSE: "What do you think is causing the weakness or fatigue?" (e.g., not drinking enough fluids, medical problem, trouble sleeping)     States had covid 5. NEW MEDICINES:  "Have you started on any new medicines  recently?" (e.g., opioid pain medicines, benzodiazepines, muscle relaxants, antidepressants, antihistamines, neuroleptics, beta blockers)     Cough syrup and pills 6. OTHER SYMPTOMS: "Do you have any other symptoms?" (e.g., chest pain, fever, cough, SOB, vomiting, diarrhea, bleeding, other areas of pain)     cough 7. PREGNANCY: "Is there any chance you are pregnant?" "When was your last menstrual period?"     na  Protocols used: Weakness (Generalized) and Fatigue-A-AH

## 2024-04-06 NOTE — Progress Notes (Signed)
 Acute Office Visit  Subjective:     Patient ID: [name], female    DOB: March 27, 1951, 73 y.o.   MRN: [id number]  Chief Complaint  Patient presents with   Acute Visit    Pt presents with fatigue and body weak. She was Covid on May 1. Yellow mucus comes with her cough.    HPI Patient is in today for evaluation of continued productive cough, fatigue, weakness. Was diagnosed with COVID on 03/29/2024. Was treated with Paxlovid , cough syrup.  States this did help some. States that she did go to feeling better and cough did improve, but is now returning over the last couple of days. Has been drinking plenty of fluids, try to get rest at home. Denies new exposure to new sick contacts. Denies abdominal pain, nausea, vomiting, diarrhea, rash, chills, fever, other symptoms. Medical history as outlined below.  Requesting refill of Vit D today as well.  ROS Per HPI      Objective:    BP 132/88 (BP Location: Left Arm, Patient Position: Sitting)   Pulse 74   Temp 98.4 F (36.9 C) (Oral)   Ht 5\' 7"  (1.702 m)   Wt 177 lb (80.3 kg)   SpO2 96%   BMI 27.72 kg/m    Physical Exam Vitals and nursing note reviewed.  Constitutional:      General: She is not in acute distress.    Comments: Appears fatigued  HENT:     Head: Normocephalic and atraumatic.     Right Ear: Tympanic membrane, ear canal and external ear normal.     Left Ear: Tympanic membrane, ear canal and external ear normal.     Nose: Congestion present.     Mouth/Throat:     Mouth: Mucous membranes are moist.     Pharynx: No oropharyngeal exudate or posterior oropharyngeal erythema.     Comments: Oropharyngeal cobblestoning   Eyes:     Extraocular Movements: Extraocular movements intact.  Cardiovascular:     Rate and Rhythm: Normal rate and regular rhythm.     Heart sounds: Normal heart sounds.  Pulmonary:     Effort: Pulmonary effort is normal. No respiratory distress.     Breath sounds: Rhonchi present. No  wheezing or rales.     Comments: Productive cough Musculoskeletal:     Cervical back: Normal range of motion and neck supple.  Lymphadenopathy:     Cervical: Cervical adenopathy present.  Skin:    General: Skin is warm and dry.  Neurological:     General: No focal deficit present.     Mental Status: She is alert and oriented to person, place, and time.     No results found for any visits on 04/06/24.      Assessment & Plan:   Acute bacterial bronchitis Assessment & Plan: Z pack today, tessalon  perles  Orders: -     Azithromycin ; Take 2 tablets (500 mg) PO today, then 1 tablet (250 mg) PO daily x4 days.  Dispense: 6 tablet; Refill: 0 -     Benzonatate ; Take 2 capsules (200 mg total) by mouth 3 (three) times daily as needed for cough.  Dispense: 20 capsule; Refill: 0  Vitamin D  deficiency Assessment & Plan: Vit D 2000 units once daily to pharmacy  Orders: -     Ergocalciferol ; Take 2,000 Units by mouth daily.  Dispense: 90 tablet; Refill: 1     Meds ordered this encounter  Medications   azithromycin  (ZITHROMAX  Z-PAK) 250 MG tablet  Sig: Take 2 tablets (500 mg) PO today, then 1 tablet (250 mg) PO daily x4 days.    Dispense:  6 tablet    Refill:  0   benzonatate  (TESSALON  PERLES) 100 MG capsule    Sig: Take 2 capsules (200 mg total) by mouth 3 (three) times daily as needed for cough.    Dispense:  20 capsule    Refill:  0   Ergocalciferol  50 MCG (2000 UT) TABS    Sig: Take 2,000 Units by mouth daily.    Dispense:  90 tablet    Refill:  1    Return if symptoms worsen or fail to improve.  Wellington Half, FNP

## 2024-04-06 NOTE — Patient Instructions (Signed)
 I have sent in azithromycin  for you to take.  Take 2 tablets today, then 1 tablet daily for the next 4 days.  I have sent in tessalon  perles for you to take 200mg  three times per day as needed for cough. This medication should not make you sleepy. Be sure to drink plenty of fluids with this medication.  I have sent in vitamin D  for you to take 2000 units once daily.  Follow-up with me for new or worsening symptoms.

## 2024-04-06 NOTE — Assessment & Plan Note (Signed)
 Z pack today, tessalon  perles

## 2024-04-06 NOTE — Assessment & Plan Note (Signed)
 Vit D 2000 units once daily to pharmacy

## 2024-04-09 ENCOUNTER — Encounter (HOSPITAL_COMMUNITY): Admission: RE | Payer: Self-pay | Source: Home / Self Care

## 2024-04-09 ENCOUNTER — Ambulatory Visit (HOSPITAL_COMMUNITY): Admission: RE | Admit: 2024-04-09 | Payer: HMO | Source: Home / Self Care | Admitting: Internal Medicine

## 2024-04-09 SURGERY — COLONOSCOPY WITH PROPOFOL
Anesthesia: Monitor Anesthesia Care

## 2024-04-12 ENCOUNTER — Other Ambulatory Visit: Payer: Self-pay | Admitting: Internal Medicine

## 2024-04-12 DIAGNOSIS — E559 Vitamin D deficiency, unspecified: Secondary | ICD-10-CM

## 2024-04-16 ENCOUNTER — Telehealth: Payer: Self-pay

## 2024-04-16 NOTE — Telephone Encounter (Signed)
 Copied from CRM [id number]. Topic: Clinical - Medical Advice >> Apr 12, 2024  4:11 PM [name] wrote: Reason for CRM: Pt request for nurse or pcp to call she is having medical concern. Pt can be reached [phone].

## 2024-04-16 NOTE — Telephone Encounter (Signed)
 Looks like you recently refilled her Vitamin D . She is supposed to still continue taking it ?

## 2024-04-17 NOTE — Telephone Encounter (Signed)
 Medication was at the pharmacy. Advise them to refill the medication. Patient has been made aware.

## 2024-05-09 DIAGNOSIS — R768 Other specified abnormal immunological findings in serum: Secondary | ICD-10-CM | POA: Diagnosis not present

## 2024-05-09 DIAGNOSIS — M1991 Primary osteoarthritis, unspecified site: Secondary | ICD-10-CM | POA: Diagnosis not present

## 2024-05-09 DIAGNOSIS — E663 Overweight: Secondary | ICD-10-CM | POA: Diagnosis not present

## 2024-05-09 DIAGNOSIS — Z6828 Body mass index (BMI) 28.0-28.9, adult: Secondary | ICD-10-CM | POA: Diagnosis not present

## 2024-05-11 ENCOUNTER — Other Ambulatory Visit: Payer: Self-pay | Admitting: Family Medicine

## 2024-05-11 ENCOUNTER — Telehealth: Payer: Self-pay

## 2024-05-11 DIAGNOSIS — E559 Vitamin D deficiency, unspecified: Secondary | ICD-10-CM

## 2024-05-11 NOTE — Telephone Encounter (Signed)
-----   Message from Nurse Jeremiah Monica sent at 04/02/2024  3:48 PM EDT ----- Regarding: hosp procedure Need to reschedule after end of may

## 2024-05-11 NOTE — Telephone Encounter (Signed)
 Pt scheduled for previsit 06/18/24@8 :30, colon scheduled at [facility] 07/17/24 at 3pm, pt to arrive there at 1:30pm. Pt aware of appts.

## 2024-05-11 NOTE — Telephone Encounter (Signed)
 See virtual telephone note dated 03/19/24, pt ok for porcedure and to hold plavix  for 5 days prior to procedure.

## 2024-05-24 ENCOUNTER — Other Ambulatory Visit: Payer: Self-pay | Admitting: Family Medicine

## 2024-05-24 DIAGNOSIS — J302 Other seasonal allergic rhinitis: Secondary | ICD-10-CM

## 2024-06-04 ENCOUNTER — Ambulatory Visit (INDEPENDENT_AMBULATORY_CARE_PROVIDER_SITE_OTHER): Admitting: Sports Medicine

## 2024-06-04 ENCOUNTER — Other Ambulatory Visit: Payer: Self-pay

## 2024-06-04 VITALS — BP 128/76 | HR 71 | Ht 67.0 in | Wt 178.0 lb

## 2024-06-04 DIAGNOSIS — M1712 Unilateral primary osteoarthritis, left knee: Secondary | ICD-10-CM

## 2024-06-04 DIAGNOSIS — G8929 Other chronic pain: Secondary | ICD-10-CM

## 2024-06-04 DIAGNOSIS — M25562 Pain in left knee: Secondary | ICD-10-CM | POA: Diagnosis not present
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
## 2024-06-04 NOTE — Patient Instructions (Signed)
4-6 week follow up

## 2024-06-07 ENCOUNTER — Ambulatory Visit: Admitting: Sports Medicine

## 2024-06-18 ENCOUNTER — Ambulatory Visit (AMBULATORY_SURGERY_CENTER): Admitting: *Deleted

## 2024-06-18 VITALS — Wt 180.0 lb

## 2024-06-18 DIAGNOSIS — Z860100 Personal history of colon polyps, unspecified: Secondary | ICD-10-CM

## 2024-06-18 NOTE — Progress Notes (Signed)
  Pre visit completed over telephone . Instructions mailed to home address    No egg or soy allergy known to patient  No issues known to pt with past sedation with any surgeries or procedures Patient denies ever being told they had issues or difficulty with intubation  No FH of Malignant Hyperthermia Pt is not on diet pills Pt is not on  home 02  Pt is not on blood thinners   Pt denies issues with constipation. PATIENT CONFIRMED REGULAR BOWEL MOVEMENTS AND HAS LINZESS  TO TAKE IF SHE BECOMES IRREGULAR.    No A fib or A flutter Have any cardiac testing pending-- no Pt instructed to use [employer] or GoodRx for a price reduction on prep    Patient verbalized understanding  to hold PLAVIX  5 days prior to colonoscopy.  Patient had suprep from a rescheduled colonoscopy.

## 2024-06-20 ENCOUNTER — Encounter: Payer: Self-pay | Admitting: Internal Medicine

## 2024-07-02 DIAGNOSIS — H9312 Tinnitus, left ear: Secondary | ICD-10-CM | POA: Diagnosis not present

## 2024-07-02 DIAGNOSIS — H903 Sensorineural hearing loss, bilateral: Secondary | ICD-10-CM | POA: Diagnosis not present

## 2024-07-04 ENCOUNTER — Encounter: Payer: Self-pay | Admitting: Internal Medicine

## 2024-07-04 ENCOUNTER — Ambulatory Visit: Payer: Self-pay | Admitting: Internal Medicine

## 2024-07-04 ENCOUNTER — Ambulatory Visit: Payer: HMO | Admitting: Internal Medicine

## 2024-07-04 VITALS — BP 138/78 | HR 68 | Temp 98.3°F | Resp 16 | Ht 67.0 in | Wt 180.4 lb

## 2024-07-04 DIAGNOSIS — E785 Hyperlipidemia, unspecified: Secondary | ICD-10-CM | POA: Diagnosis not present

## 2024-07-04 DIAGNOSIS — I2510 Atherosclerotic heart disease of native coronary artery without angina pectoris: Secondary | ICD-10-CM

## 2024-07-04 DIAGNOSIS — E118 Type 2 diabetes mellitus with unspecified complications: Secondary | ICD-10-CM | POA: Diagnosis not present

## 2024-07-04 DIAGNOSIS — I10 Essential (primary) hypertension: Secondary | ICD-10-CM | POA: Diagnosis not present

## 2024-07-04 DIAGNOSIS — E039 Hypothyroidism, unspecified: Secondary | ICD-10-CM

## 2024-07-04 LAB — CBC WITH DIFFERENTIAL/PLATELET
Basophils Absolute: 0 K/uL (ref 0.0–0.1)
Basophils Relative: 0.6 % (ref 0.0–3.0)
Eosinophils Absolute: 0.2 K/uL (ref 0.0–0.7)
Eosinophils Relative: 2.7 % (ref 0.0–5.0)
HCT: 39.5 % (ref 36.0–46.0)
Hemoglobin: 13 g/dL (ref 12.0–15.0)
Lymphocytes Relative: 32.5 % (ref 12.0–46.0)
Lymphs Abs: 1.9 K/uL (ref 0.7–4.0)
MCHC: 32.8 g/dL (ref 30.0–36.0)
MCV: 80.6 fl (ref 78.0–100.0)
Monocytes Absolute: 0.3 K/uL (ref 0.1–1.0)
Monocytes Relative: 5.5 % (ref 3.0–12.0)
Neutro Abs: 3.5 K/uL (ref 1.4–7.7)
Neutrophils Relative %: 58.7 % (ref 43.0–77.0)
Platelets: 271 K/uL (ref 150.0–400.0)
RBC: 4.9 Mil/uL (ref 3.87–5.11)
RDW: 14.7 % (ref 11.5–15.5)
WBC: 5.9 K/uL (ref 4.0–10.5)

## 2024-07-04 LAB — MICROALBUMIN / CREATININE URINE RATIO
Creatinine,U: 51.6 mg/dL
Microalb Creat Ratio: 16.5 mg/g (ref 0.0–30.0)
Microalb, Ur: 0.8 mg/dL (ref 0.0–1.9)

## 2024-07-04 LAB — URINALYSIS, ROUTINE W REFLEX MICROSCOPIC
Bilirubin Urine: NEGATIVE
Ketones, ur: NEGATIVE
Nitrite: NEGATIVE
RBC / HPF: NONE SEEN (ref 0–?)
Specific Gravity, Urine: 1.01 (ref 1.000–1.030)
Total Protein, Urine: NEGATIVE
Urine Glucose: NEGATIVE
Urobilinogen, UA: 0.2 (ref 0.0–1.0)
pH: 6 (ref 5.0–8.0)

## 2024-07-04 LAB — BASIC METABOLIC PANEL WITH GFR
BUN: 9 mg/dL (ref 6–23)
CO2: 27 meq/L (ref 19–32)
Calcium: 9.2 mg/dL (ref 8.4–10.5)
Chloride: 104 meq/L (ref 96–112)
Creatinine, Ser: 0.66 mg/dL (ref 0.40–1.20)
GFR: 87.21 mL/min (ref >60.00)
Glucose, Bld: 124 mg/dL — ABNORMAL HIGH (ref 70–99)
Potassium: 3.7 meq/L (ref 3.5–5.1)
Sodium: 140 meq/L (ref 135–145)

## 2024-07-04 LAB — LIPID PANEL
Cholesterol: 121 mg/dL (ref 0–200)
HDL: 52.9 mg/dL (ref >39.00)
LDL Cholesterol: 46 mg/dL (ref 0–99)
NonHDL: 68.45
Total CHOL/HDL Ratio: 2
Triglycerides: 110 mg/dL (ref 0.0–149.0)
VLDL: 22 mg/dL (ref 0.0–40.0)

## 2024-07-04 LAB — HEMOGLOBIN A1C: Hgb A1c MFr Bld: 6.5 % (ref 4.6–6.5)

## 2024-07-04 LAB — TSH: TSH: 2.19 u[IU]/mL (ref 0.35–5.50)

## 2024-07-04 NOTE — Progress Notes (Signed)
 Subjective:  Patient ID: [name], female    DOB: 23-Jan-1951  Age: 73 y.o. MRN: [id number]  CC: Hypertension, Hyperlipidemia, Hypothyroidism, Diabetes, and Coronary Artery Disease   HPI [name] presents for f/up  ---  Discussed the use of AI scribe software for clinical note transcription with the patient, who gave verbal consent to proceed.  History of Present Illness [name] is a 73 year old female who presents for a routine visit and preparation for an upcoming colonoscopy.  She feels generally well with no headaches, blurred vision, chest pain, or shortness of breath. She is preparing for a colonoscopy later this month.  She reports having had COVID-19 infection in May and states she has recovered, with no lingering symptoms such as coughing, wheezing, or shortness of breath.  She experiences intermittent severe pain and swelling in the left knee, particularly at night, which sometimes wakes her up. The pain is severe enough to require her to hold her knee for relief. Previously, fluid was aspirated from the knee, providing temporary relief. She has taken Aleve  for the pain but is concerned about its interaction with her blood thinner, Plavix . She has since taken Tylenol  for pain management.  She is on Plavix  for a heart-related condition, which requires careful management of her medication regimen.    Outpatient Medications Prior to Visit  Medication Sig Dispense Refill   amLODipine  (NORVASC ) 5 MG tablet Take 1 tablet (5 mg total) by mouth every morning. 90 tablet 3   carvedilol  (COREG ) 6.25 MG tablet Take 1 tablet (6.25 mg total) by mouth 2 (two) times daily with a meal. 180 tablet 3   Cetirizine HCl (ZYRTEC PO) Take 1 tablet by mouth daily at 6 (six) AM.     Cholecalciferol (VITAMIN D3) 50 MCG (2000 UT) capsule TAKE 2,000 UNITS BY MOUTH DAILY. 90 capsule 1   clonazePAM  (KLONOPIN ) 0.5 MG tablet Take 1 tablet (0.5 mg total) by mouth 2 (two) times daily as needed for  anxiety. 180 tablet 1   clopidogrel  (PLAVIX ) 75 MG tablet Take 1 tablet (75 mg total) by mouth every morning. 90 tablet 3   famotidine  (PEPCID ) 40 MG tablet TAKE 1 TABLET BY MOUTH EVERY MORNING 90 tablet 1   fluticasone  (FLONASE  SENSIMIST) 27.5 MCG/SPRAY nasal spray Place 2 sprays into the nose daily. 10 g 0   isosorbide  mononitrate (IMDUR ) 30 MG 24 hr tablet Take 1 tablet (30 mg total) by mouth every morning. 90 tablet 3   levothyroxine  (SYNTHROID ) 100 MCG tablet TAKE 1 TABLET BY MOUTH EVERY DAY BEFORE BREAKFAST 90 tablet 1   linaclotide  (LINZESS ) 145 MCG CAPS capsule Take 1 capsule (145 mcg total) by mouth daily before breakfast. 90 capsule 1   losartan  (COZAAR ) 25 MG tablet Take 1 tablet (25 mg total) by mouth every morning. 90 tablet 3   magnesium  oxide (MAG-OX) 400 MG tablet Take 400 mg by mouth daily.     montelukast  (SINGULAIR ) 10 MG tablet TAKE 1 TABLET BY MOUTH EVERYDAY AT BEDTIME 90 tablet 1   nitroGLYCERIN  (NITROSTAT ) 0.4 MG SL tablet Place 1 tablet (0.4 mg total) under the tongue every 5 (five) minutes as needed for chest pain. 25 tablet 6   pantoprazole  (PROTONIX ) 20 MG tablet TAKE 1 TABLET BY MOUTH EVERY MORNING 90 tablet 1   rosuvastatin  (CRESTOR ) 10 MG tablet Take 1 tablet (10 mg total) by mouth at bedtime. 90 tablet 3   azithromycin  (ZITHROMAX  Z-PAK) 250 MG tablet Take 2 tablets (500  mg) PO today, then 1 tablet (250 mg) PO daily x4 days. (Patient not taking: Reported on 06/18/2024) 6 tablet 0   benzonatate  (TESSALON  PERLES) 100 MG capsule Take 2 capsules (200 mg total) by mouth 3 (three) times daily as needed for cough. 20 capsule 0   fexofenadine  (ALLEGRA ) 180 MG tablet Take 1 tablet (180 mg total) by mouth daily. 90 tablet 0   HYDROcodone  bit-homatropine (HYCODAN) 5-1.5 MG/5ML syrup Take 5 mLs by mouth every 8 (eight) hours as needed for cough. 120 mL 0   No facility-administered medications prior to visit.    ROS Review of Systems  Constitutional:  Negative for appetite  change, chills, diaphoresis, fatigue and fever.  HENT: Negative.    Eyes: Negative.   Respiratory: Negative.  Negative for cough, chest tightness, shortness of breath and wheezing.   Cardiovascular:  Negative for chest pain, palpitations and leg swelling.  Gastrointestinal:  Negative for abdominal pain, constipation, diarrhea, nausea and vomiting.  Genitourinary: Negative.  Negative for difficulty urinating and dysuria.  Musculoskeletal: Negative.  Negative for arthralgias and myalgias.  Neurological:  Negative for dizziness, weakness and light-headedness.  Hematological:  Negative for adenopathy. Does not bruise/bleed easily.  Psychiatric/Behavioral: Negative.      Objective:  BP 138/78 (BP Location: Left Arm, Patient Position: Sitting, Cuff Size: Normal)   Pulse 68   Temp 98.3 F (36.8 C) (Oral)   Resp 16   Ht 5' 7 (1.702 m)   Wt 180 lb 6.4 oz (81.8 kg)   SpO2 95%   BMI 28.25 kg/m   BP Readings from Last 3 Encounters:  07/04/24 138/78  06/04/24 128/76  04/06/24 132/88    Wt Readings from Last 3 Encounters:  07/04/24 180 lb 6.4 oz (81.8 kg)  06/18/24 180 lb (81.6 kg)  06/04/24 178 lb (80.7 kg)    Physical Exam Vitals reviewed.  Constitutional:      Appearance: Normal appearance.  HENT:     Nose: Nose normal.     Mouth/Throat:     Mouth: Mucous membranes are moist.  Eyes:     General: No scleral icterus.    Conjunctiva/sclera: Conjunctivae normal.  Cardiovascular:     Rate and Rhythm: Regular rhythm. Bradycardia present.     Heart sounds: No murmur heard.    No friction rub. No gallop.     Comments: EKG--- SR with 1st degree AV block, 63 bpm No LVH, Q waves, or ST/T wave changes  Unchanged  Pulmonary:     Effort: Pulmonary effort is normal.     Breath sounds: No stridor. No wheezing, rhonchi or rales.  Abdominal:     General: Abdomen is flat.     Palpations: There is no mass.     Tenderness: There is no abdominal tenderness. There is no guarding.      Hernia: No hernia is present.  Musculoskeletal:     Cervical back: Neck supple.     Right lower leg: No edema.     Left lower leg: No edema.  Lymphadenopathy:     Cervical: No cervical adenopathy.  Skin:    General: Skin is warm and dry.  Neurological:     General: No focal deficit present.     Mental Status: She is alert. Mental status is at baseline.  Psychiatric:        Mood and Affect: Mood normal.        Behavior: Behavior normal.     Lab Results  Component Value Date  WBC 5.9 07/04/2024   HGB 13.0 07/04/2024   HCT 39.5 07/04/2024   PLT 271.0 07/04/2024   GLUCOSE 124 (H) 07/04/2024   CHOL 121 07/04/2024   TRIG 110.0 07/04/2024   HDL 52.90 07/04/2024   LDLDIRECT 88.0 10/19/2016   LDLCALC 46 07/04/2024   ALT 10 02/27/2024   AST 16 02/27/2024   NA 140 07/04/2024   K 3.7 07/04/2024   CL 104 07/04/2024   CREATININE 0.66 07/04/2024   BUN 9 07/04/2024   CO2 27 07/04/2024   TSH 2.19 07/04/2024   INR 1.01 05/08/2018   HGBA1C 6.5 07/04/2024   MICROALBUR 0.8 07/04/2024    MM 3D SCREENING MAMMOGRAM BILATERAL BREAST Result Date: 09/06/2023 CLINICAL DATA:  Screening. EXAM: DIGITAL SCREENING BILATERAL MAMMOGRAM WITH TOMOSYNTHESIS AND CAD TECHNIQUE: Bilateral screening digital craniocaudal and mediolateral oblique mammograms were obtained. Bilateral screening digital breast tomosynthesis was performed. The images were evaluated with computer-aided detection. COMPARISON:  Previous exam(s). ACR Breast Density Category b: There are scattered areas of fibroglandular density. FINDINGS: There are no findings suspicious for malignancy. IMPRESSION: No mammographic evidence of malignancy. A result letter of this screening mammogram will be mailed directly to the patient. RECOMMENDATION: Screening mammogram in one year. (Code:SM-B-01Y) BI-RADS CATEGORY  1: Negative. Electronically Signed   By: Rosaline Collet M.D.   On: 09/06/2023 18:32    Assessment & Plan:   Acquired hypothyroidism-  She is euthyroid. -     TSH; Future  Type II diabetes mellitus with manifestations (HCC)- Blood sugar is well controlled. -     Basic metabolic panel with GFR; Future -     Urinalysis, Routine w reflex microscopic; Future -     Hemoglobin A1c; Future -     Microalbumin / creatinine urine ratio; Future  Hyperlipidemia LDL goal <70 -     Lipid panel; Future -     TSH; Future  Essential hypertension- BP is well controlled. EKG is negative for LVH. -     CBC with Differential/Platelet; Future -     TSH; Future -     Urinalysis, Routine w reflex microscopic; Future  Coronary artery disease involving native coronary artery of native heart without angina pectoris -     EKG 12-Lead     Follow-up: Return in about 6 months (around 01/04/2025).  Debby Molt, MD

## 2024-07-04 NOTE — Patient Instructions (Signed)

## 2024-07-09 ENCOUNTER — Other Ambulatory Visit: Payer: Self-pay

## 2024-07-09 ENCOUNTER — Encounter (HOSPITAL_COMMUNITY): Payer: Self-pay | Admitting: Internal Medicine
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
## 2024-07-10 ENCOUNTER — Ambulatory Visit: Admitting: Sports Medicine

## 2024-07-10 ENCOUNTER — Telehealth: Payer: Self-pay

## 2024-07-10 ENCOUNTER — Telehealth: Payer: Self-pay | Admitting: Sports Medicine

## 2024-07-10 VITALS — HR 85 | Ht 67.0 in | Wt 180.0 lb

## 2024-07-10 DIAGNOSIS — M1712 Unilateral primary osteoarthritis, left knee: Secondary | ICD-10-CM

## 2024-07-10 DIAGNOSIS — M25562 Pain in left knee: Secondary | ICD-10-CM

## 2024-07-10 DIAGNOSIS — G8929 Other chronic pain: Secondary | ICD-10-CM | POA: Diagnosis not present

## 2024-07-10 NOTE — Telephone Encounter (Signed)
 Procedure:COLON Procedure date: 07/17/24 Procedure location: [facility] Arrival Time: 11:00 Spoke with the patient Y/N: Y Any prep concerns? N  Has the patient obtained the prep from the pharmacy ? Y Do you have a care partner and transportation: Y Any additional concerns? N

## 2024-07-10 NOTE — Telephone Encounter (Signed)
 Patient ran for Zilretta  for left knee on 07/10/24. Case #: [id number]. Pending approval.

## 2024-07-10 NOTE — Telephone Encounter (Signed)
Closing on my end.

## 2024-07-10 NOTE — Telephone Encounter (Signed)
 I reached out to patient and clarified when to stop taking her medications. Informed patient to hold plavix  5 days prior to her procedure, last dose starting tomorrow. Informed patient she can take her other regular medications. Patient verbalized understanding. Informed patient to contact our office with any other questions. Patient verbalized understanding.

## 2024-07-10 NOTE — Telephone Encounter (Signed)
-----   Message from Gordy HERO Pyrtle sent at 07/10/2024  3:43 PM EDT ----- Thanks for the message I will have staff reach out and clarify with her. Best Gordy Palma,  Can you help clarify with patient what she needs to take and change before her upcoming procedure Thank you [name] ----- Message ----- From: Leonce Katz, DO Sent: 07/10/2024   8:26 AM EDT To: Gordy HERO Starch, MD  Hello! [name] is a shared patient of ours.  I just saw her in office to discuss ongoing knee pain.  She let me know that she had her colonoscopy scheduled with you for 07/17/2024.  She said that someone from your office reached out to her to assist her with which chronic medications she should hold and continue.  Unfortunately, Azalynn is highly confused and does not understand which medicines she should hold or continue.  She told me her plan was to stop all medication the day of the procedure and then restart it later that day after the procedure.  I told her that was not a good idea, and that she should reach out to your office.  She does seem to be easily confused when discussing medications and does not have a firm grasp of what medication she takes on a daily basis.  I am concerned that she will not reach out to your office, so I thought I would message you to give you an update.  Thanks,  - Odis

## 2024-07-10 NOTE — Patient Instructions (Addendum)
 Tylenol  [phone] mg 2-3 times a day for pain relief   PT referral   L knee Zilretta  prior auth   6 week follow up   Reach out to GI doctor and ask them which medication you should and should not take

## 2024-07-11 NOTE — Telephone Encounter (Signed)
 Patient needs an appointment once medication is stocked.   Zilretta  approved for left knee.   [employer] Ad[employer]age: No prior authorization, medical notes or referrals needed. Patient has a Medicare Advantage HMO plan effective date of 11/30/23. Plan follows Medicare guidelines. Patient responsibility for [phone] (Zilretta ) will be 20% with the remaining covered at 80% by the payer at the contracted rate. CPT code 79389 is covered at 100% by the payer at the contracted rate with no patient responsibility. Deductibles do not apply to these services. Specialist office visits if billed are covered after a $5 copay. Patient has an out of pocket maximum of $2400 and has accumulated $25.72. If out of pocket is met, coverage goes to 100% and copays will no longer apply.   [employer]: No prior authorization, medical notes or referrals needed. Patient has a Fully [employer] plan with an effective date of 12/19/2016. Plan follows [facility] guidelines. G6695 (zilretta ), CPT code 79389 and specialist office visits are covered at 100% by the payer at the contracted rate with no patient responsibility. Deductible and out of pocket do not apply to these services. The above coverage represents 100% of the Primary?s remaining coinsurance and deductible for J3304 (Zilretta ) and the procedure 20610.  Reference #: [id number]  Case ID: [id number] Exp: 01/11/2025

## 2024-07-12 NOTE — Telephone Encounter (Signed)
 Scheduled 9/23

## 2024-07-15 ENCOUNTER — Other Ambulatory Visit: Payer: Self-pay | Admitting: Cardiovascular Disease

## 2024-07-17 ENCOUNTER — Encounter (HOSPITAL_COMMUNITY): Payer: Self-pay | Admitting: Internal Medicine

## 2024-07-17 ENCOUNTER — Ambulatory Visit (HOSPITAL_COMMUNITY): Admitting: Anesthesiology

## 2024-07-17 ENCOUNTER — Ambulatory Visit (HOSPITAL_COMMUNITY)
Admission: RE | Admit: 2024-07-17 | Discharge: 2024-07-17 | Disposition: A | Discharge status: Home / Self Care | Attending: Internal Medicine | Admitting: Internal Medicine

## 2024-07-17 ENCOUNTER — Other Ambulatory Visit: Payer: Self-pay | Admitting: Internal Medicine

## 2024-07-17 ENCOUNTER — Ambulatory Visit (HOSPITAL_BASED_OUTPATIENT_CLINIC_OR_DEPARTMENT_OTHER): Admitting: Anesthesiology

## 2024-07-17 ENCOUNTER — Other Ambulatory Visit: Payer: Self-pay | Admitting: Cardiovascular Disease

## 2024-07-17 ENCOUNTER — Other Ambulatory Visit: Payer: Self-pay

## 2024-07-17 ENCOUNTER — Encounter (HOSPITAL_COMMUNITY)
Admission: RE | Disposition: A | Discharge status: Home / Self Care | Payer: Self-pay | Source: Home / Self Care | Attending: Internal Medicine

## 2024-07-17 DIAGNOSIS — Z09 Encounter for follow-up examination after completed treatment for conditions other than malignant neoplasm: Secondary | ICD-10-CM | POA: Diagnosis present

## 2024-07-17 DIAGNOSIS — K648 Other hemorrhoids: Secondary | ICD-10-CM | POA: Diagnosis not present

## 2024-07-17 DIAGNOSIS — I10 Essential (primary) hypertension: Secondary | ICD-10-CM | POA: Diagnosis not present

## 2024-07-17 DIAGNOSIS — Z79899 Other long term (current) drug therapy: Secondary | ICD-10-CM | POA: Insufficient documentation

## 2024-07-17 DIAGNOSIS — Z860101 Personal history of adenomatous and serrated colon polyps: Secondary | ICD-10-CM

## 2024-07-17 DIAGNOSIS — I252 Old myocardial infarction: Secondary | ICD-10-CM | POA: Insufficient documentation

## 2024-07-17 DIAGNOSIS — D125 Benign neoplasm of sigmoid colon: Secondary | ICD-10-CM

## 2024-07-17 DIAGNOSIS — E039 Hypothyroidism, unspecified: Secondary | ICD-10-CM | POA: Diagnosis not present

## 2024-07-17 DIAGNOSIS — K219 Gastro-esophageal reflux disease without esophagitis: Secondary | ICD-10-CM | POA: Insufficient documentation

## 2024-07-17 DIAGNOSIS — Z1211 Encounter for screening for malignant neoplasm of colon: Secondary | ICD-10-CM

## 2024-07-17 DIAGNOSIS — K7581 Nonalcoholic steatohepatitis (NASH): Secondary | ICD-10-CM | POA: Insufficient documentation

## 2024-07-17 DIAGNOSIS — Q438 Other specified congenital malformations of intestine: Secondary | ICD-10-CM | POA: Insufficient documentation

## 2024-07-17 DIAGNOSIS — I2510 Atherosclerotic heart disease of native coronary artery without angina pectoris: Secondary | ICD-10-CM | POA: Diagnosis not present

## 2024-07-17 DIAGNOSIS — Z7902 Long term (current) use of antithrombotics/antiplatelets: Secondary | ICD-10-CM | POA: Insufficient documentation

## 2024-07-17 DIAGNOSIS — E119 Type 2 diabetes mellitus without complications: Secondary | ICD-10-CM | POA: Insufficient documentation

## 2024-07-17 DIAGNOSIS — K56609 Unspecified intestinal obstruction, unspecified as to partial versus complete obstruction: Secondary | ICD-10-CM

## 2024-07-17 DIAGNOSIS — E785 Hyperlipidemia, unspecified: Secondary | ICD-10-CM | POA: Diagnosis not present

## 2024-07-17 DIAGNOSIS — K635 Polyp of colon: Secondary | ICD-10-CM | POA: Insufficient documentation

## 2024-07-17 DIAGNOSIS — K589 Irritable bowel syndrome without diarrhea: Secondary | ICD-10-CM | POA: Diagnosis not present

## 2024-07-17 DIAGNOSIS — Z860100 Personal history of colon polyps, unspecified: Secondary | ICD-10-CM | POA: Diagnosis not present

## 2024-07-17 HISTORY — PX: COLONOSCOPY: SHX5424

## 2024-07-17 SURGERY — COLONOSCOPY
Anesthesia: Monitor Anesthesia Care

## 2024-07-17 MED ORDER — SODIUM CHLORIDE 0.9 % IV SOLN
INTRAVENOUS | Status: AC | PRN
  Administered 2024-07-17: 500 mL via INTRAVENOUS

## 2024-07-17 MED ORDER — LIDOCAINE HCL 1 % IJ SOLN
INTRAMUSCULAR | Status: DC | PRN
  Administered 2024-07-17 (×2): 40 mg via INTRADERMAL

## 2024-07-17 MED ORDER — PROPOFOL 1000 MG/100ML IV EMUL
INTRAVENOUS | Status: AC
  Filled 2024-07-17: qty 100

## 2024-07-17 MED ORDER — SODIUM CHLORIDE 0.9 % IV SOLN: INTRAVENOUS | Status: DC

## 2024-07-17 MED ORDER — PROPOFOL 500 MG/50ML IV EMUL
INTRAVENOUS | Status: DC | PRN
  Administered 2024-07-17: 130 ug/kg/min via INTRAVENOUS

## 2024-07-17 NOTE — Transfer of Care (Signed)
 Immediate Anesthesia Transfer of Care Note  Patient: [name]  Procedure(s) Performed: COLONOSCOPY  Patient Location: PACU and Endoscopy Unit  Anesthesia Type:MAC  Level of Consciousness: awake, alert , oriented, and patient cooperative  Airway & Oxygen Therapy: Patient Spontanous Breathing and Patient connected to face mask oxygen  Post-op Assessment: Report given to RN and Post -op Vital signs reviewed and stable  Post vital signs: Reviewed and stable  Last Vitals:  Vitals Value Taken Time  BP 117/59 07/17/24 13:46  Temp    Pulse 80 07/17/24 13:46  Resp 25 07/17/24 13:46  SpO2 100 % 07/17/24 13:46  Vitals shown include unfiled device data.  Last Pain:  Vitals:   07/17/24 1346  TempSrc: Temporal  PainSc: 0-No pain      Patients Stated Pain Goal: 0 (07/17/24 1116)  Complications: No notable events documented.

## 2024-07-17 NOTE — Anesthesia Preprocedure Evaluation (Addendum)
 Anesthesia Evaluation  Patient identified by MRN, date of birth, ID band Patient awake    Reviewed: Allergy & Precautions, NPO status , Patient's Chart, lab work & pertinent test results, reviewed documented beta blocker date and time   Airway Mallampati: III  TM Distance: >3 FB     Dental  (+) Upper Dentures, Lower Dentures   Pulmonary pneumonia, resolved   Pulmonary exam normal breath sounds clear to auscultation       Cardiovascular hypertension, Pt. on medications and Pt. on home beta blockers + CAD and + Past MI  Normal cardiovascular exam Rhythm:Regular Rate:Normal  Echo 08/17/19 1. Left ventricular ejection fraction, by visual estimation, is 70 to  75%. The left ventricle has hyperdynamic function. Normal left ventricular  size. There is no left ventricular hypertrophy.   2. Elevated left ventricular end-diastolic pressure.   3. Left ventricular diastolic Doppler parameters are consistent with  impaired relaxation pattern of LV diastolic filling.   4. Global right ventricle has normal systolic function.The right  ventricular size is normal. No increase in right ventricular wall  thickness.   5. Left atrial size was normal.   6. Right atrial size was normal.   7. The mitral valve is normal in structure. No evidence of mitral valve  regurgitation. No evidence of mitral stenosis.   8. The tricuspid valve is normal in structure. Tricuspid valve  regurgitation was not visualized by color flow Doppler.   9. The aortic valve is normal in structure. Aortic valve regurgitation  was not visualized by color flow Doppler. Structurally normal aortic  valve, with no evidence of sclerosis or stenosis.  10. The pulmonic valve was normal in structure. Pulmonic valve  regurgitation is not visualized by color flow Doppler.  11. The inferior vena cava is normal in size with greater than 50%  respiratory variability, suggesting right atrial  pressure of 3 mmHg.   EKG 07/26/23 Sinus bradycardia with 1st degree A-V block   Neuro/Psych  PSYCHIATRIC DISORDERS Anxiety      Neuromuscular disease    GI/Hepatic ,GERD  Medicated,,(+) Hepatitis -, UnspecifiedNASH Screening for Colon Ca IBS   Endo/Other  diabetes, Well Controlled, Type 2Hypothyroidism  HLD  Renal/GU negative Renal ROS     Musculoskeletal  (+) Arthritis , Osteoarthritis,  Fibromyalgia -  Abdominal   Peds  Hematology  (+) Blood dyscrasia, anemia Plavix  therapy- last dose   Anesthesia Other Findings   Reproductive/Obstetrics                              Anesthesia Physical Anesthesia Plan  ASA: 3  Anesthesia Plan: MAC   Post-op Pain Management: Minimal or no pain anticipated   Induction: Intravenous  PONV Risk Score and Plan: 2 and Treatment may vary due to age or medical condition and Propofol  infusion  Airway Management Planned: Natural Airway, Nasal Cannula and Simple Face Mask  Additional Equipment: None  Intra-op Plan:   Post-operative Plan:   Informed Consent: I have reviewed the patients History and Physical, chart, labs and discussed the procedure including the risks, benefits and alternatives for the proposed anesthesia with the patient or authorized representative who has indicated his/her understanding and acceptance.     Dental advisory given  Plan Discussed with: CRNA and Anesthesiologist  Anesthesia Plan Comments:          Anesthesia Quick Evaluation

## 2024-07-17 NOTE — Anesthesia Postprocedure Evaluation (Signed)
 Anesthesia Post Note  Patient: [name]  Procedure(s) Performed: COLONOSCOPY     Patient location during evaluation: PACU Anesthesia Type: MAC Level of consciousness: awake and alert and oriented Pain management: pain level controlled Vital Signs Assessment: post-procedure vital signs reviewed and stable Respiratory status: spontaneous breathing, nonlabored ventilation and respiratory function stable Cardiovascular status: stable and blood pressure returned to baseline Postop Assessment: no apparent nausea or vomiting Anesthetic complications: no   No notable events documented.  Last Vitals:  Vitals:   07/17/24 1400 07/17/24 1406  BP: (!) 140/56   Pulse: 64 68  Resp: 17 20  Temp:    SpO2: 97% 97%    Last Pain:  Vitals:   07/17/24 1400  TempSrc:   PainSc: 0-No pain                 Flannery Cavallero A.

## 2024-07-17 NOTE — Discharge Instructions (Signed)

## 2024-07-17 NOTE — Op Note (Signed)
 [facility] Patient Name: [name] Procedure Date: 07/17/2024 MRN: [id number] Attending MD: Gordy CHRISTELLA Starch , MD, [id number] Date of Birth: [birth year] CSN: [id number] Age: 73 Admit Type: Inpatient Procedure:                Colonoscopy Indications:              High risk colon cancer surveillance: Personal                            history of adenoma (10 mm or greater in size), Last                            colonoscopy: November 2021 (TA x 3, one = 10 mm),                            Nov 2018 (TA x 3, one > 1 cm) Providers:                Gordy CHRISTELLA. Starch, MD, Darleene Bare, RN, Coye Bade, Technician Referring MD:             Debby FREDRIK Molt, MD Medicines:                Monitored Anesthesia Care Complications:            No immediate complications. Estimated Blood Loss:     Estimated blood loss: none. Procedure:                Pre-Anesthesia Assessment:                           - Prior to the procedure, a History and Physical                            was performed, and patient medications and                            allergies were reviewed. The patient's tolerance of                            previous anesthesia was also reviewed. The risks                            and benefits of the procedure and the sedation                            options and risks were discussed with the patient.                            All questions were answered, and informed consent                            was obtained. Prior Anticoagulants: The patient has  taken Plavix  (clopidogrel ), last dose was 5 days                            prior to procedure. ASA Grade Assessment: III - A                            patient with severe systemic disease. After                            reviewing the risks and benefits, the patient was                            deemed in satisfactory condition to undergo the                             procedure.                           After obtaining informed consent, the colonoscope                            was passed under direct vision. Throughout the                            procedure, the patient's blood pressure, pulse, and                            oxygen saturations were monitored continuously. The                            PCF-H190TL ([id number]) Olympus Colonoscope was                            introduced through the anus and advanced to the                            cecum, identified by the appendiceal orifice,                            ileocecal valve and palpation. The colonoscopy was                            technically difficult and complex due to restricted                            mobility of the colon and a tortuous colon.                            Successful completion of the procedure was aided by                            applying abdominal pressure. The patient tolerated  the procedure well. Scope In: 1:18:48 PM Scope Out: 1:38:14 PM Scope Withdrawal Time: 0 hours 7 minutes 49 seconds  Total Procedure Duration: 0 hours 19 minutes 26 seconds  Findings:      The digital rectal exam was normal.      The sigmoid colon and descending colon were significantly tortuous with       luminal stenosis and sharp angulation in the distal sigmoid. This area       was traversed after water immersion and with counterpressure.      A 3 mm polyp was found in the sigmoid colon. The polyp was sessile. The       polyp was removed with a cold biopsy forceps. Resection and retrieval       were complete.      Internal hemorrhoids were found during retroflexion. The hemorrhoids       were small. Impression:               - Tortuous colon with angulation and luminal                            stenosis in the sigmoid and descending colon.                           - One 3 mm polyp in the sigmoid colon, removed with                             a cold biopsy forceps. Resected and retrieved.                           - Small internal hemorrhoids. Moderate Sedation:      N/A Recommendation:           - Patient has a contact number available for                            emergencies. The signs and symptoms of potential                            delayed complications were discussed with the                            patient. Return to normal activities tomorrow.                            Written discharge instructions were provided to the                            patient.                           - Resume previous diet.                           - Continue present medications.                           - Resume Plavix  (clopidogrel ) at prior dose today.  Refer to managing physician for further adjustment                            of therapy.                           - Await pathology results.                           - No recommendation at this time regarding repeat                            colonoscopy due to age at next surveillance                            interval. Procedure Code(s):        --- Professional ---                           [id number], Colonoscopy, flexible; with biopsy, single                            or multiple Diagnosis Code(s):        --- Professional ---                           Z86.010, Personal history of colonic polyps                           D12.5, Benign neoplasm of sigmoid colon                           K64.8, Other hemorrhoids                           Q43.8, Other specified congenital malformations of                            intestine CPT copyright 2022 American Medical Association. All rights reserved. The codes documented in this report are preliminary and upon coder review may  be revised to meet current compliance requirements. Gordy CHRISTELLA Starch, MD 07/17/2024 1:46:55 PM This report has been signed electronically. Number of Addenda: 0

## 2024-07-17 NOTE — H&P (Addendum)
 GASTROENTEROLOGY PROCEDURE H&P NOTE   Primary Care Physician: [name], MD    Reason for Procedure:   Hx of colonic polyps  Plan:    Colonoscopy   Patient is appropriate for endoscopic procedure(s) in the outpatient hospital setting.  The nature of the procedure, as well as the risks, benefits, and alternatives were carefully and thoroughly reviewed with the patient. Ample time for discussion and questions allowed. The patient understood, was satisfied, and agreed to proceed.     HPI: [name] is a 73 y.o. female who presents for colonoscopy.  Medical history as below.  Tolerated the prep.  No recent chest pain or shortness of breath.  No abdominal pain today.  Past Medical History:  Diagnosis Date   Anemia    Arthritis    Cardiomyopathy (HCC)    Cholelithiasis    Chronic back pain    Constipation    Esophageal reflux    Fibromyalgia    Hemorrhoids    Hyperlipidemia    Hypertension    Hypothyroid    IBS (irritable bowel syndrome)    Internal hemorrhoids without mention of complication    Left leg pain    Myocardial infarction [facility])    Personal history of colonic polyps 05/06/2010   ADENOMATOUS POLYP   Pneumonia    Tubular adenoma of colon     Past Surgical History:  Procedure Laterality Date   ABDOMINAL HYSTERECTOMY     CERVICAL DISC SURGERY     PLATE IN NECK & BACK   CHOLECYSTECTOMY     COLONOSCOPY  09/27/2011   NORMAL    COLONOSCOPY WITH PROPOFOL  N/A 10/06/2020   Procedure: COLONOSCOPY WITH PROPOFOL ;  Surgeon: Wilhelmenia Aloha Raddle., MD;  Location: [facility] ENDOSCOPY;  Service: Gastroenterology;  Laterality: N/A;  ultra slim scope    LEFT HEART CATH AND CORONARY ANGIOGRAPHY N/A 05/08/2018   Procedure: LEFT HEART CATH AND CORONARY ANGIOGRAPHY;  Surgeon: Claudene Victory ORN, MD;  Location: [facility] INVASIVE CV LAB;  Service: Cardiovascular;  Laterality: N/A;   LUMBAR DISC SURGERY     POLYPECTOMY  10/06/2020   Procedure: POLYPECTOMY;  Surgeon: Mansouraty, Aloha Raddle., MD;  Location: [facility] ENDOSCOPY;  Service: Gastroenterology;;   TONSILLECTOMY      Prior to Admission medications   Medication Sig Start Date End Date Taking? Authorizing Provider  amLODipine  (NORVASC ) 5 MG tablet Take 1 tablet (5 mg total) by mouth every morning. 07/26/23  Yes Wonda Sharper, MD  carvedilol  (COREG ) 6.25 MG tablet Take 1 tablet (6.25 mg total) by mouth 2 (two) times daily with a meal. 07/26/23  Yes Wonda Sharper, MD  famotidine  (PEPCID ) 40 MG tablet TAKE 1 TABLET BY MOUTH EVERY MORNING 01/30/24  Yes [name], MD  isosorbide  mononitrate (IMDUR ) 30 MG 24 hr tablet Take 1 tablet (30 mg total) by mouth every morning. 07/26/23  Yes Wonda Sharper, MD  levothyroxine  (SYNTHROID ) 100 MCG tablet TAKE 1 TABLET BY MOUTH EVERY DAY BEFORE BREAKFAST 02/02/24  Yes [name], MD  losartan  (COZAAR ) 25 MG tablet Take 1 tablet (25 mg total) by mouth every morning. 07/26/23  Yes Wonda Sharper, MD  magnesium  oxide (MAG-OX) 400 MG tablet Take 400 mg by mouth daily.   Yes [provider]  pantoprazole  (PROTONIX ) 20 MG tablet TAKE 1 TABLET BY MOUTH EVERY MORNING 01/30/24  Yes [name], MD  Cetirizine HCl (ZYRTEC PO) Take 1 tablet by mouth daily at 6 (six) AM.    [provider]  Cholecalciferol (VITAMIN D3) 50 MCG (2000 UT) capsule TAKE 2,000 UNITS BY MOUTH DAILY. 04/06/24   Alvia Corean CROME, FNP  clonazePAM  (KLONOPIN ) 0.5 MG tablet Take 1 tablet (0.5 mg total) by mouth 2 (two) times daily as needed for anxiety. 03/17/22   [name], MD  clopidogrel  (PLAVIX ) 75 MG tablet Take 1 tablet (75 mg total) by mouth every morning. 07/26/23   Wonda Sharper, MD  fluticasone  (FLONASE  SENSIMIST) 27.5 MCG/SPRAY nasal spray Place 2 sprays into the nose daily. 02/27/24   Joyce, Britney F, FNP  linaclotide  (LINZESS ) 145 MCG CAPS capsule Take 1 capsule (145 mcg total) by mouth daily before breakfast. 03/16/23   [name], MD  montelukast  (SINGULAIR ) 10 MG tablet TAKE 1  TABLET BY MOUTH EVERYDAY AT BEDTIME 05/24/24   [name], MD  nitroGLYCERIN  (NITROSTAT ) 0.4 MG SL tablet Place 1 tablet (0.4 mg total) under the tongue every 5 (five) minutes as needed for chest pain. 03/04/20   Wonda Sharper, MD  rosuvastatin  (CRESTOR ) 10 MG tablet Take 1 tablet (10 mg total) by mouth at bedtime. 07/26/23   Wonda Sharper, MD    Current Facility-Administered Medications  Medication Dose Route Frequency Provider Last Rate Last Admin   0.9 %  sodium chloride  infusion    Continuous PRN Albertus Gordy HERO, MD 20 mL/hr at 07/17/24 1136 500 mL at 07/17/24 1136   0.9 %  sodium chloride  infusion   Intravenous Continuous Shadi Larner, Gordy HERO, MD        Allergies as of 05/11/2024 - Review Complete 04/06/2024  Allergen Reaction Noted   Aspirin Rash    Gadolinium Hives 03/26/2007   Iohexol  Hives 03/26/2007   Livalo  [pitavastatin ] Other (See Comments) 06/14/2012   Tramadol  hcl Nausea Only 12/26/2023   Hydrocodone -acetaminophen  Rash    Lipitor [atorvastatin] Other (See Comments) 12/11/2009   Oxycodone  Itching 01/05/2024    Family History  Problem Relation Age of Onset   Diabetes Mother        aunts and uncles   Kidney disease Mother        stage 3   Lung disease Mother        colapsed lung/in hospice   COPD Mother    Hypertension Mother    Dementia Mother    Heart failure Father        Mother   Breast cancer Maternal Aunt    Breast cancer Cousin    Colon cancer Neg Hx    Esophageal cancer Neg Hx    Stomach cancer Neg Hx    Rectal cancer Neg Hx    Colon polyps Neg Hx     Social History   Socioeconomic History   Marital status: Widowed    Spouse name: Not on file   Number of children: 2   Years of education: Not on file   Highest education level: Not on file  Occupational History   Occupation: housewife    Employer: UNEMPLOYED   Tobacco Use   Smoking status: Never    Passive exposure: Never   Smokeless tobacco: Never  Vaping Use   Vaping status: Never Used   Substance and Sexual Activity   Alcohol use: No    Alcohol/week: 0.0 standard drinks of alcohol   Drug use: No   Sexual activity: Not Currently  Other Topics Concern   Not on file  Social History Narrative   Daily Caffeine Use   Lives alone-2025   Social Drivers of Health   Financial Resource Strain: Low  Risk  (01/06/2024)   Overall Financial Resource Strain (CARDIA)    Difficulty of Paying Living Expenses: Not hard at all  Food Insecurity: No Food Insecurity (01/06/2024)   Hunger Vital Sign    Worried About Running Out of Food in the Last Year: Never true    Ran Out of Food in the Last Year: Never true  Transportation Needs: No Transportation Needs (01/06/2024)   PRAPARE - Administrator, Civil Service (Medical): No    Lack of Transportation (Non-Medical): No  Physical Activity: Insufficiently Active (01/06/2024)   Exercise Vital Sign    Days of Exercise per Week: 7 days    Minutes of Exercise per Session: 10 min  Stress: No Stress Concern Present (01/06/2024)   [employer] of Occupational Health - Occupational Stress Questionnaire    Feeling of Stress : Not at all  Social Connections: Moderately Integrated (01/06/2024)   Social Connection and Isolation Panel    Frequency of Communication with Friends and Family: More than three times a week    Frequency of Social Gatherings with Friends and Family: More than three times a week    Attends Religious Services: More than 4 times per year    Active Member of [employer] or Organizations: Yes    Attends Banker Meetings: Never    Marital Status: Widowed  Intimate Partner Violence: Not At Risk (01/18/2023)   Humiliation, Afraid, Rape, and Kick questionnaire    Fear of Current or Ex-Partner: No    Emotionally Abused: No    Physically Abused: No    Sexually Abused: No    Physical Exam: Vital signs in last 24 hours: @BP  138/75   Temp 97.8 F (36.6 C) (Temporal)   Resp (!) 23   Ht 5' 7 (1.702 m)   Wt 81.6 kg    SpO2 97%   BMI 28.19 kg/m  GEN: NAD EYE: Sclerae anicteric ENT: MMM CV: Non-tachycardic Pulm: CTA b/l GI: Soft, NT/ND NEURO:  Alert & Oriented x 3   Gordy Starch, MD [facility]  07/17/2024 12:58 PM

## 2024-07-18 LAB — SURGICAL PATHOLOGY

## 2024-07-19 ENCOUNTER — Ambulatory Visit: Payer: Self-pay | Admitting: Internal Medicine

## 2024-07-19 ENCOUNTER — Encounter (HOSPITAL_COMMUNITY): Payer: Self-pay | Admitting: Internal Medicine

## 2024-07-22 ENCOUNTER — Other Ambulatory Visit: Payer: Self-pay | Admitting: Cardiovascular Disease

## 2024-07-22 ENCOUNTER — Other Ambulatory Visit: Payer: Self-pay | Admitting: Internal Medicine

## 2024-07-22 DIAGNOSIS — K2100 Gastro-esophageal reflux disease with esophagitis, without bleeding: Secondary | ICD-10-CM

## 2024-07-24 ENCOUNTER — Other Ambulatory Visit: Payer: Self-pay | Admitting: Internal Medicine

## 2024-07-24 DIAGNOSIS — Z1231 Encounter for screening mammogram for malignant neoplasm of breast: Secondary | ICD-10-CM

## 2024-07-24 NOTE — Therapy (Signed)
 OUTPATIENT PHYSICAL THERAPY LOWER EXTREMITY EVALUATION   Patient Name: [name] MRN: [id number] DOB:18-Dec-1950, 73 y.o., female Today's Date: 07/25/2024  END OF SESSION:  PT End of Session - 07/25/24 0811     Visit Number 1    Number of Visits 13    Date for PT Re-Evaluation 09/14/24    Authorization Type [employer]    PT Start Time 0805    PT Stop Time 0845    PT Time Calculation (min) 40 min    Activity Tolerance Patient tolerated treatment well;Patient limited by pain    Behavior During Therapy [facility] for tasks assessed/performed          Past Medical History:  Diagnosis Date   Anemia    Arthritis    Cardiomyopathy (HCC)    Cholelithiasis    Chronic back pain    Constipation    Esophageal reflux    Fibromyalgia    Hemorrhoids    Hyperlipidemia    Hypertension    Hypothyroid    IBS (irritable bowel syndrome)    Internal hemorrhoids without mention of complication    Left leg pain    Myocardial infarction [facility])    Personal history of colonic polyps 05/06/2010   ADENOMATOUS POLYP   Pneumonia    Tubular adenoma of colon    Past Surgical History:  Procedure Laterality Date   ABDOMINAL HYSTERECTOMY     CERVICAL DISC SURGERY     PLATE IN NECK & BACK   CHOLECYSTECTOMY     COLONOSCOPY  09/27/2011   NORMAL    COLONOSCOPY N/A 07/17/2024   Procedure: COLONOSCOPY;  Surgeon: Albertus Gordy HERO, MD;  Location: [facility] ENDOSCOPY;  Service: Gastroenterology;  Laterality: N/A;   COLONOSCOPY WITH PROPOFOL  N/A 10/06/2020   Procedure: COLONOSCOPY WITH PROPOFOL ;  Surgeon: Wilhelmenia Aloha Raddle., MD;  Location: [facility] ENDOSCOPY;  Service: Gastroenterology;  Laterality: N/A;  ultra slim scope    LEFT HEART CATH AND CORONARY ANGIOGRAPHY N/A 05/08/2018   Procedure: LEFT HEART CATH AND CORONARY ANGIOGRAPHY;  Surgeon: Claudene Victory ORN, MD;  Location: [facility] INVASIVE CV LAB;  Service: Cardiovascular;  Laterality: N/A;   LUMBAR DISC SURGERY     POLYPECTOMY  10/06/2020   Procedure:  POLYPECTOMY;  Surgeon: Mansouraty, Aloha Raddle., MD;  Location: [facility] ENDOSCOPY;  Service: Gastroenterology;;   TONSILLECTOMY     Patient Active Problem List   Diagnosis Date Noted   Benign neoplasm of sigmoid colon 07/17/2024   Encounter for immunization 01/08/2024   Eczema of scalp 01/05/2023   Encounter for general adult medical examination with abnormal findings 01/03/2023   Somnolence 09/23/2022   Flu vaccine need 09/14/2022   Sensorineural hearing loss (SNHL) of left ear with unrestricted hearing of right ear 07/21/2022   Primary osteoarthritis of left knee 12/24/2021   Onychomycosis of left great toe 12/14/2021   Type II diabetes mellitus with manifestations (HCC) 12/14/2021   Balding 05/21/2020   Allergic rhinitis 04/12/2020   Acute URI 03/20/2019   Takotsubo cardiomyopathy 02/26/2019   NASH (nonalcoholic steatohepatitis) 01/22/2019   Chronic idiopathic constipation 01/12/2019   Arthritis of carpometacarpal (CMC) joint of left thumb 09/25/2018   Degenerative arthritis of left knee 05/24/2018   CAD (coronary artery disease) 05/16/2018   Vitamin D  deficiency 11/11/2017   Degenerative disc disease, lumbar 08/29/2017   Obesity (BMI 30.0-34.9) 06/14/2016   Osteopenia 07/20/2013   GERD (gastroesophageal reflux disease) 09/16/2011   Generalized anxiety disorder 09/16/2011   History of colonic polyps 08/17/2011   DJD (degenerative  joint disease) of knee 06/25/2011   Pure hyperglyceridemia 06/24/2011   Hyperlipidemia LDL goal <70 07/30/2009   Hypothyroidism 04/25/2009   Essential hypertension 04/25/2009    PCP: [name], MD  REFERRING PROVIDER: Leonce Katz, DO   REFERRING DIAG:  F82.87 (ICD-10-CM) - Primary osteoarthritis of left knee  M25.562,G89.29 (ICD-10-CM) - Chronic pain of left knee    THERAPY DIAG:  Chronic pain of left knee  Muscle weakness (generalized)  Difficulty in walking, not elsewhere classified  Rationale for Evaluation and Treatment:  Rehabilitation  ONSET DATE: Chronic, over a year  SUBJECTIVE:   SUBJECTIVE STATEMENT: Pt reports chronic L knee which signifiicantly impacts her ability to walk and sleep at night. Shots have helped temporarily. Pt notes hse rides an ex bike 15-20 mins everyday.  PERTINENT HISTORY: See above. Hx MI, DM2  PAIN:  Are you having pain? Yes: NPRS scale: 8/10 Pain location: ant/medial L knee Pain description: throbbing, sharp Aggravating factors: standing, walking, sleeping Relieving factors: Rest sometimes helps  PRECAUTIONS: None  RED FLAGS: None   WEIGHT BEARING RESTRICTIONS: No  FALLS:  Has patient fallen in last 6 months? No  LIVING ENVIRONMENT: Lives with: lives alone Lives in: House/apartment Stairs: Yes: External: 3 steps; none Has following equipment at home: Single point cane  OCCUPATION: Retired  PLOF: Independent  PATIENT GOALS: Less pain, to get around better, go to church  NEXT MD VISIT: 08/21/24, Dr. Leonce  OBJECTIVE:  Note: Objective measures were completed at Evaluation unless otherwise noted.  DIAGNOSTIC FINDINGS:  L knee 12/23/23 IMPRESSION: Tricompartmental osteoarthritis, moderate in the medial compartment. Mild progression from prior exam.  PATIENT SURVEYS:  LEFS:16/80=20%  COGNITION: Overall cognitive status: Within functional limits for tasks assessed     SENSATION: WFL  EDEMA:  Swelling L knee   MUSCLE LENGTH: Hamstrings: Right NT deg; Left NT deg Debby test: Right NT deg; Left NT deg  POSTURE: No Significant postural limitations  PALPATION: TTP ant/medial L knee joint space   LOWER EXTREMITY ROM:  Active ROM Right eval Left eval  Hip flexion    Hip extension    Hip abduction    Hip adduction    Hip internal rotation    Hip external rotation    Knee flexion 135 90  Knee extension 0 15 lacking  Ankle dorsiflexion    Ankle plantarflexion    Ankle inversion    Ankle eversion     (Blank rows = not  tested)  LOWER EXTREMITY MMT:  MMT Right eval Left eval  Hip flexion 4 3  Hip extension    Hip abduction 4 3  Hip adduction    Hip internal rotation    Hip external rotation 4 3  Knee flexion 4+ 4  Knee extension 4+ 4  Ankle dorsiflexion    Ankle plantarflexion    Ankle inversion    Ankle eversion     (Blank rows = not tested)  FUNCTIONAL TESTS:  5 times sit to stand: 39 c use of hands  GAIT: Distance walked: 200' Assistive device utilized: None Level of assistance: Complete Independence Comments: Mod antalgic gait pattern over L LE at a decreased pace and flexed L knee  TREATMENT DATE:  [facility] Adult PT Treatment:                                                DATE: 07/24/24 Therapeutic Exercise: Developed, instructed in, and pt completed therex as noted in HEP  Self Care: RICE for symptom management for R knee pain and swelling   Use of a SPC for unweighting for pain reduction with pt practicing  PATIENT EDUCATION:  Education details: Eval findings, POC, HEP, self care  Person educated: Patient Education method: Explanation, Demonstration, Tactile cues, Verbal cues, and Handouts Education comprehension: verbalized understanding, returned demonstration, verbal cues required, and tactile cues required  HOME EXERCISE PROGRAM: Access Code: [email or web address] URL: [email or web address] Date: 07/25/2024 Prepared by: Dasie Daft  Exercises - Supine Quad Set  - 3 x daily - 7 x weekly - 1 sets - 5 reps - 5 hold - Active Straight Leg Raise with Quad Set  - 3 x daily - 7 x weekly - 1 sets - 5 reps - 3 hold  ASSESSMENT:  CLINICAL IMPRESSION: Patient is a 73 y.o. female who was seen today for physical therapy evaluation and treatment for  M17.12 (ICD-10-CM) - Primary osteoarthritis of left knee  M25.562,G89.29 (ICD-10-CM) - Chronic pain of left knee  Pt  presents to PT with chronic L knee pain with decreased AROM and knee/hip strength, and pt walks with a mod antalgic gait pattern. Observation reveals arthritic changes and swelling. Pt was started on a HEP and instructed in self care. Pt may benefit from skilled PT 2w6 to address impairments to optimize L knee function with less pain.   OBJECTIVE IMPAIRMENTS: decreased activity tolerance, decreased balance, difficulty walking, decreased ROM, decreased strength, increased edema, and pain.   ACTIVITY LIMITATIONS: carrying, bending, squatting, stairs, transfers, and locomotion level  PARTICIPATION LIMITATIONS: meal prep, cleaning, laundry, shopping, community activity, and church  PERSONAL FACTORS: Fitness, Past/current experiences, Time since onset of injury/illness/exacerbation, and 1 comorbidity: DM2 are also affecting patient's functional outcome.   REHAB POTENTIAL: Fair chronicity of issue  CLINICAL DECISION MAKING: Evolving/moderate complexity  EVALUATION COMPLEXITY: Moderate   GOALS:  SHORT TERM GOALS = LTGs  LONG TERM GOALS: Target date: 09/14/24  Pt will be Ind in a final HEP to maintain achieved LOF Baseline: started Goal status: INITIAL  2.  Pt will report 50% or greater improvement in her L knee pain for improved function and QOL Baseline:  Goal status: INITIAL  3.  Improve 5xSTS by 10 as indication of improved functional mobility  Baseline: 39 with use of hands Goal status: INITIAL  4.  Pt will demonstrate an improved quality of gait walking with only a minimal to no antalgic gait pattern as indication of improved pain  Baseline: mod antalgic gait pattern Goal status: INITIAL  5.  Pt's LEFS score will improve by the MCID to 35% as indication of improved function  Baseline: 20% Goal status: INITIAL   PLAN:  PT FREQUENCY: 2x/week  PT DURATION: 6 weeks  PLANNED INTERVENTIONS: 97164- PT Re-evaluation, 97110-Therapeutic exercises, 97530- Therapeutic activity,  97112- Neuromuscular re-education, 97535- Self Care, 02859- Manual therapy, [id number]- Gait training, [phone]- Aquatic Therapy, [phone]- Electrical stimulation (unattended), [phone]- Ionotophoresis 4mg /ml Dexamethasone, 79439 (1-2 muscles), 20561 (3+ muscles)- Dry Needling, Patient/Family education, Balance training, Stair training, Taping, Joint mobilization, Cryotherapy, and Moist heat  PLAN FOR NEXT SESSION: Assess response to  HEP; progress therex as indicated; use of modalities, manual therapy; and TPDN as indicated.    Rivka Baune MS, PT 07/25/24 1:38 PM

## 2024-07-25 ENCOUNTER — Ambulatory Visit: Attending: Sports Medicine

## 2024-07-25 ENCOUNTER — Other Ambulatory Visit: Payer: Self-pay

## 2024-07-25 ENCOUNTER — Encounter: Payer: Self-pay | Admitting: Cardiovascular Disease

## 2024-07-25 ENCOUNTER — Ambulatory Visit: Attending: Cardiovascular Disease | Admitting: Cardiovascular Disease

## 2024-07-25 VITALS — BP 124/70 | HR 62 | Ht 66.0 in | Wt 181.2 lb

## 2024-07-25 DIAGNOSIS — R011 Cardiac murmur, unspecified: Secondary | ICD-10-CM | POA: Diagnosis not present

## 2024-07-25 DIAGNOSIS — M25562 Pain in left knee: Secondary | ICD-10-CM | POA: Diagnosis not present

## 2024-07-25 DIAGNOSIS — I5181 Takotsubo syndrome: Secondary | ICD-10-CM | POA: Diagnosis not present

## 2024-07-25 DIAGNOSIS — E782 Mixed hyperlipidemia: Secondary | ICD-10-CM | POA: Diagnosis not present

## 2024-07-25 DIAGNOSIS — G8929 Other chronic pain: Secondary | ICD-10-CM | POA: Insufficient documentation

## 2024-07-25 DIAGNOSIS — I2510 Atherosclerotic heart disease of native coronary artery without angina pectoris: Secondary | ICD-10-CM | POA: Diagnosis not present

## 2024-07-25 DIAGNOSIS — I2584 Coronary atherosclerosis due to calcified coronary lesion: Secondary | ICD-10-CM | POA: Diagnosis not present

## 2024-07-25 DIAGNOSIS — I10 Essential (primary) hypertension: Secondary | ICD-10-CM

## 2024-07-25 DIAGNOSIS — M6281 Muscle weakness (generalized): Secondary | ICD-10-CM | POA: Diagnosis not present

## 2024-07-25 DIAGNOSIS — R262 Difficulty in walking, not elsewhere classified: Secondary | ICD-10-CM | POA: Insufficient documentation

## 2024-07-25 DIAGNOSIS — M1712 Unilateral primary osteoarthritis, left knee: Secondary | ICD-10-CM | POA: Diagnosis not present

## 2024-07-25 MED ORDER — LOSARTAN POTASSIUM 25 MG PO TABS: 25.0000 mg | ORAL_TABLET | Freq: Every day | ORAL | 3 refills | Status: AC

## 2024-07-25 NOTE — Assessment & Plan Note (Signed)
 Blood pressure controlled on multidrug therapy with amlodipine , carvedilol , and losartan .  Continue current management.

## 2024-07-25 NOTE — Patient Instructions (Signed)
 Medication Instructions:  No medication changes were made at this visit. Continue current regimen.   *If you need a refill on your cardiac medications before your next appointment, please call your pharmacy*  Lab Work: None ordered today. If you have labs (blood work) drawn today and your tests are completely normal, you will receive your results only by: MyChart Message (if you have MyChart) OR A paper copy in the mail If you have any lab test that is abnormal or we need to change your treatment, we will call you to review the results.  Testing/Procedures: Your physician has requested that you have an echocardiogram. Echocardiography is a painless test that uses sound waves to create images of your heart. It provides your doctor with information about the size and shape of your heart and how well your heart's chambers and valves are working. This procedure takes approximately one hour. There are no restrictions for this procedure. Please do NOT wear cologne, perfume, aftershave, or lotions (deodorant is allowed). Please arrive 15 minutes prior to your appointment time.  Please note: We ask at that you not bring children with you during ultrasound (echo/ vascular) testing. Due to room size and safety concerns, children are not allowed in the ultrasound rooms during exams. Our front office staff cannot provide observation of children in our lobby area while testing is being conducted. An adult accompanying a patient to their appointment will only be allowed in the ultrasound room at the discretion of the ultrasound technician under special circumstances. We apologize for any inconvenience.   Follow-Up: At [facility], you and your health needs are our priority.  As part of our continuing mission to provide you with exceptional heart care, our providers are all part of one team.  This team includes your primary Cardiologist (physician) and Advanced Practice Providers or APPs (Physician  Assistants and Nurse Practitioners) who all work together to provide you with the care you need, when you need it.  Your next appointment:   1 year(s)  Provider:   Ozell Fell, MD

## 2024-07-25 NOTE — Assessment & Plan Note (Signed)
 Clinically doing well with NYHA functional class I symptoms.  Last echo from 2020 showed normalization of LVEF.  Will repeat an echocardiogram.  Continue carvedilol .

## 2024-07-25 NOTE — Progress Notes (Signed)
 Cardiology Office Note:    Date:  07/25/2024   ID:  [name], DOB August 01, 1951, MRN [id number]  PCP:  Emily Debby CROME, MD   [facility] Providers Cardiologist:  Emily Fell, MD     Referring MD: Emily Debby CROME, MD   Chief Complaint  Patient presents with   Follow-up    Takotsubo cardiomyopathy    History of Present Illness:    STEPHANY [name] is a 73 y.o. female with a hx of Takotsubo cardiomyopathy, presenting today for follow-up evaluation.  The patient presented in 2019 with acute Takotsubo syndrome, LVEF 45% at the time.  Follow-up echo demonstrated normalization of LV function with an LVEF in the range of 70 to 75%.  The patient has also been followed for hypertension and hypertensive heart disease.   The patient is here alone today. She had COVID in May '25 and reports that she was quite ill with this, but didn't require hospitalization. She's now having problems with her left knee and she is about to begin working with physical therapy. From a cardiac perspective, she is doing well. Today, she denies symptoms of palpitations, chest pain, shortness of breath, orthopnea, PND, lower extremity edema, dizziness, or syncope.    Current Medications: Current Meds  Medication Sig   amLODipine  (NORVASC ) 5 MG tablet Take 1 tablet (5 mg total) by mouth every morning.   carvedilol  (COREG ) 6.25 MG tablet Take 1 tablet (6.25 mg total) by mouth 2 (two) times daily with a meal.   Cholecalciferol (VITAMIN D3) 50 MCG (2000 UT) capsule TAKE 2,000 UNITS BY MOUTH DAILY.   clonazePAM  (KLONOPIN ) 0.5 MG tablet Take 1 tablet (0.5 mg total) by mouth 2 (two) times daily as needed for anxiety.   clopidogrel  (PLAVIX ) 75 MG tablet TAKE 1 TABLET BY MOUTH EVERY MORNING.   famotidine  (PEPCID ) 40 MG tablet TAKE 1 TABLET BY MOUTH EVERY MORNING   fluticasone  (FLONASE  SENSIMIST) 27.5 MCG/SPRAY nasal spray Place 2 sprays into the nose daily.   isosorbide  mononitrate (IMDUR ) 30 MG 24 hr tablet TAKE 1  TABLET BY MOUTH EVERY MORNING   levothyroxine  (SYNTHROID ) 100 MCG tablet TAKE 1 TABLET BY MOUTH EVERY DAY BEFORE BREAKFAST   linaclotide  (LINZESS ) 145 MCG CAPS capsule Take 1 capsule (145 mcg total) by mouth daily before breakfast. (Patient taking differently: Take 145 mcg by mouth as needed.)   magnesium  oxide (MAG-OX) 400 MG tablet Take 400 mg by mouth daily.   montelukast  (SINGULAIR ) 10 MG tablet TAKE 1 TABLET BY MOUTH EVERYDAY AT BEDTIME   nitroGLYCERIN  (NITROSTAT ) 0.4 MG SL tablet Place 1 tablet (0.4 mg total) under the tongue every 5 (five) minutes as needed for chest pain.   pantoprazole  (PROTONIX ) 20 MG tablet TAKE 1 TABLET BY MOUTH EVERY MORNING   rosuvastatin  (CRESTOR ) 10 MG tablet Take 1 tablet (10 mg total) by mouth at bedtime.   [DISCONTINUED] Cetirizine HCl (ZYRTEC PO) Take 1 tablet by mouth daily at 6 (six) AM.   [DISCONTINUED] losartan  (COZAAR ) 25 MG tablet Take 1 tablet (25 mg total) by mouth daily. Patient must call and schedule annual appointment for further refills     Allergies:   Aspirin, Gadolinium, Iohexol , Livalo  [pitavastatin ], Tramadol  hcl, Hydrocodone -acetaminophen , Lipitor [atorvastatin], and Oxycodone    ROS:   Please see the history of present illness.    All other systems reviewed and are negative.  EKGs/Labs/Other Studies Reviewed:    The following studies were reviewed today: Cardiac Studies & Procedures   ______________________________________________________________________________________________ CARDIAC CATHETERIZATION  CARDIAC CATHETERIZATION 05/08/2018  Conclusion  Acute coronary syndrome with anterior wall motion abnormality and essentially widely patent coronary arteries.  Suspect variant stress cardiomyopathy.  (Cannot totally exclude transient occlusion and recanalization of the first diagonal which is relatively small and has moderate somewhat hazy disease in the mid segment up to 70%.)  Normal left main  LAD is large, wraps around the left  ventricular apex, and gives origin to 3 diagonal branches, the second of which is discussed above with mid vessel 70% narrowing in an artery that is less than 2 mm in diameter.  Beyond that there is either a small branch or occlusion of the branch.  Images are ambiguous.  Circumflex is large, first marginal and second marginal widely patent.  Second marginal bifurcates.  Right coronary is tortuous but widely patent.  Abnormal left ventricular wall motion with mid to distal anterior wall akinesis, EF 45%, and elevated LVEDP consistent with acute combined systolic and diastolic heart failure.  RECOMMENDATIONS:   Risk factor modification: Blood pressure 130/80 mmHg or less, LDL cholesterol less than 70, check A1c, and encourage exercise when appropriate.  Add low-dose beta-blocker  IV nitroglycerin  and IV heparin  x24 hours then wean.  2D Doppler echocardiogram tomorrow.  Findings Coronary Findings Diagnostic  Dominance: Right  Left Anterior Descending  Second Diagonal Branch 2nd Diag-1 lesion is 70% stenosed. 2nd Diag-2 lesion is 100% stenosed.  Right Coronary Artery  First Right Posterolateral Branch Vessel is small in size.  Intervention  No interventions have been documented.     ECHOCARDIOGRAM  ECHOCARDIOGRAM COMPLETE 08/17/2019  Narrative ECHOCARDIOGRAM REPORT    Patient Name:   [name] Date of Exam: 08/17/2019 Medical Rec #:  [id number]    Height:       67.0 in Accession #:    [id number]   Weight:       185.0 lb Date of Birth:  Jul 31, 1951    BSA:          1.96 m Patient Age:    68 years     BP:           151/78 mmHg Patient Gender: F            HR:           73 bpm. Exam Location:  Inpatient  Procedure: 2D Echo, Cardiac Doppler and Color Doppler  Indications:    Chest Pain 786.50  History:        Patient has prior history of Echocardiogram examinations, most recent 09/12/2018. Takotsubo CMO; CAD Signs/Symptoms:Chest Pain Risk Factors:Hypertension,  Dyslipidemia and Non-Smoker.  Sonographer:    Recardo Rosa RDCS Referring Phys: [id number] [name] [facility]  IMPRESSIONS   1. Left ventricular ejection fraction, by visual estimation, is 70 to 75%. The left ventricle has hyperdynamic function. Normal left ventricular size. There is no left ventricular hypertrophy. 2. Elevated left ventricular end-diastolic pressure. 3. Left ventricular diastolic Doppler parameters are consistent with impaired relaxation pattern of LV diastolic filling. 4. Global right ventricle has normal systolic function.The right ventricular size is normal. No increase in right ventricular wall thickness. 5. Left atrial size was normal. 6. Right atrial size was normal. 7. The mitral valve is normal in structure. No evidence of mitral valve regurgitation. No evidence of mitral stenosis. 8. The tricuspid valve is normal in structure. Tricuspid valve regurgitation was not visualized by color flow Doppler. 9. The aortic valve is normal in structure. Aortic valve regurgitation was not visualized by color flow Doppler. Structurally  normal aortic valve, with no evidence of sclerosis or stenosis. 10. The pulmonic valve was normal in structure. Pulmonic valve regurgitation is not visualized by color flow Doppler. 11. The inferior vena cava is normal in size with greater than 50% respiratory variability, suggesting right atrial pressure of 3 mmHg.  FINDINGS Left Ventricle: Left ventricular ejection fraction, by visual estimation, is 70 to 75%. The left ventricle has hyperdynamic function. No evidence of left ventricular regional wall motion abnormalities. There is no left ventricular hypertrophy. Normal left ventricular size. Spectral Doppler shows Left ventricular diastolic Doppler parameters are consistent with impaired relaxation pattern of LV diastolic filling. Elevated left ventricular end-diastolic pressure.  Right Ventricle: The right ventricular size is normal. No  increase in right ventricular wall thickness. Global RV systolic function is has normal systolic function.  Left Atrium: Left atrial size was normal in size.  Right Atrium: Right atrial size was normal in size  Pericardium: There is no evidence of pericardial effusion.  Mitral Valve: The mitral valve is normal in structure. No evidence of mitral valve stenosis by observation. No evidence of mitral valve regurgitation.  Tricuspid Valve: The tricuspid valve is normal in structure. Tricuspid valve regurgitation was not visualized by color flow Doppler.  Aortic Valve: The aortic valve is normal in structure. Aortic valve regurgitation was not visualized by color flow Doppler. The aortic valve is structurally normal, with no evidence of sclerosis or stenosis.  Pulmonic Valve: The pulmonic valve was normal in structure. Pulmonic valve regurgitation is not visualized by color flow Doppler.  Aorta: The aortic root, ascending aorta and aortic arch are all structurally normal, with no evidence of dilitation or obstruction.  Venous: The inferior vena cava is normal in size with greater than 50% respiratory variability, suggesting right atrial pressure of 3 mmHg.  IAS/Shunts: No atrial level shunt detected by color flow Doppler. No ventricular septal defect is seen or detected. There is no evidence of an atrial septal defect.    LEFT VENTRICLE          Normals PLAX 2D LVIDd:         3.70 cm  3.6 cm   Diastology                 Normals LVIDs:         2.70 cm  1.7 cm   LV e' lateral:   7.95 cm/s 6.42 cm/s LV PW:         0.80 cm  1.4 cm   LV E/e' lateral: 10.2      15.4 LV IVS:        0.80 cm  1.3 cm   LV e' medial:    3.72 cm/s 6.96 cm/s LVOT diam:     1.90 cm  2.0 cm   LV E/e' medial:  21.8      6.96 LV SV:         31 ml    79 ml LV SV Index:   15.45    45 ml/m2 LVOT Area:     2.84 cm 3.14 cm2   RIGHT VENTRICLE RV S prime:     13.10 cm/s TAPSE (M-mode): 1.8 cm  LEFT ATRIUM           Index        RIGHT ATRIUM          Index LA diam:      2.50 cm 1.28 cm/m  RA Area:     8.67 cm LA Vol (  A2C): 28.1 ml 14.36 ml/m RA Volume:   13.50 ml 6.90 ml/m LA Vol (A4C): 33.2 ml 16.97 ml/m AORTIC VALVE             Normals LVOT Vmax:   119.00 cm/s LVOT Vmean:  85.800 cm/s 75 cm/s LVOT VTI:    0.254 m     25.3 cm  AORTA                 Normals Ao Root diam: 3.00 cm 31 mm  MITRAL VALVE              Normals MV Area (PHT): 3.08 cm             SHUNTS MV PHT:        71.34 msec 55 ms     Systemic VTI:  0.25 m MV Decel Time: 246 msec   187 ms    Systemic Diam: 1.90 cm MV E velocity: 81.00 cm/s 103 cm/s MV A velocity: 75.50 cm/s 70.3 cm/s MV E/A ratio:  1.07       1.5   Leim Moose MD Electronically signed by Leim Moose MD Signature Date/Time: 08/17/2019/5:13:51 PM    Final          ______________________________________________________________________________________________      EKG:        Recent Labs: 02/27/2024: ALT 10 07/04/2024: BUN 9; Creatinine, Ser 0.66; Hemoglobin 13.0; Platelets 271.0; Potassium 3.7; Sodium 140; TSH 2.19  Recent Lipid Panel    Component Value Date/Time   CHOL 121 07/04/2024 0900   CHOL 109 06/27/2018 0812   TRIG 110.0 07/04/2024 0900   HDL 52.90 07/04/2024 0900   HDL 43 06/27/2018 0812   CHOLHDL 2 07/04/2024 0900   VLDL 22.0 07/04/2024 0900   LDLCALC 46 07/04/2024 0900   LDLCALC 63 06/30/2020 0843   LDLDIRECT 88.0 10/19/2016 0844     Risk Assessment/Calculations:                Physical Exam:    VS:  BP 124/70   Pulse 62   Ht 5' 6 (1.676 m)   Wt 181 lb 3.2 oz (82.2 kg)   SpO2 97%   BMI 29.25 kg/m     Wt Readings from Last 3 Encounters:  07/25/24 181 lb 3.2 oz (82.2 kg)  07/17/24 180 lb (81.6 kg)  07/10/24 180 lb (81.6 kg)     GEN:  Well nourished, well developed in no acute distress HEENT: Normal NECK: No JVD; No carotid bruits LYMPHATICS: No lymphadenopathy CARDIAC: RRR, 2/6 systolic murmur at the right  upper sternal border, early peaking. RESPIRATORY:  Clear to auscultation without rales, wheezing or rhonchi  ABDOMEN: Soft, non-tender, non-distended MUSCULOSKELETAL:  No edema; No deformity  SKIN: Warm and dry NEUROLOGIC:  Alert and oriented x 3 PSYCHIATRIC:  Normal affect   Assessment & Plan Takotsubo cardiomyopathy Clinically doing well with NYHA functional class I symptoms.  Last echo from 2020 showed normalization of LVEF.  Will repeat an echocardiogram.  Continue carvedilol . Essential hypertension Blood pressure controlled on multidrug therapy with amlodipine , carvedilol , and losartan .  Continue current management. Mixed hyperlipidemia Treated with rosuvastatin  10 mg daily.  Last lipids done recently show an LDL cholesterol of 46. Coronary artery disease due to calcified coronary lesion Patient with nonobstructive CAD, treated with isosorbide , carvedilol , amlodipine , and clopidogrel  in the setting of aspirin allergy. Cardiac murmur The patient has an aortic outflow murmur.  I recommended a 2D echocardiogram to assess aortic valve morphology and further  evaluate her cardiac murmur.      Medication Adjustments/Labs and Tests Ordered: Current medicines are reviewed at length with the patient today.  Concerns regarding medicines are outlined above.  Orders Placed This Encounter  Procedures   ECHOCARDIOGRAM COMPLETE   Meds ordered this encounter  Medications   losartan  (COZAAR ) 25 MG tablet    Sig: Take 1 tablet (25 mg total) by mouth daily. Patient must call and schedule annual appointment for further refills    Dispense:  90 tablet    Refill:  3    Patient must call and schedule annual appointment for further refills    Patient Instructions  Medication Instructions:  No medication changes were made at this visit. Continue current regimen.   *If you need a refill on your cardiac medications before your next appointment, please call your pharmacy*  Lab Work: None ordered  today. If you have labs (blood work) drawn today and your tests are completely normal, you will receive your results only by: MyChart Message (if you have MyChart) OR A paper copy in the mail If you have any lab test that is abnormal or we need to change your treatment, we will call you to review the results.  Testing/Procedures: Your physician has requested that you have an echocardiogram. Echocardiography is a painless test that uses sound waves to create images of your heart. It provides your doctor with information about the size and shape of your heart and how well your heart's chambers and valves are working. This procedure takes approximately one hour. There are no restrictions for this procedure. Please do NOT wear cologne, perfume, aftershave, or lotions (deodorant is allowed). Please arrive 15 minutes prior to your appointment time.  Please note: We ask at that you not bring children with you during ultrasound (echo/ vascular) testing. Due to room size and safety concerns, children are not allowed in the ultrasound rooms during exams. Our front office staff cannot provide observation of children in our lobby area while testing is being conducted. An adult accompanying a patient to their appointment will only be allowed in the ultrasound room at the discretion of the ultrasound technician under special circumstances. We apologize for any inconvenience.   Follow-Up: At [facility], you and your health needs are our priority.  As part of our continuing mission to provide you with exceptional heart care, our providers are all part of one team.  This team includes your primary Cardiologist (physician) and Advanced Practice Providers or APPs (Physician Assistants and Nurse Practitioners) who all work together to provide you with the care you need, when you need it.  Your next appointment:   1 year(s)  Provider:   Ozell Fell, MD      Signed, Emily Fell, MD  07/25/2024  11:29 AM    [facility]

## 2024-07-25 NOTE — Assessment & Plan Note (Signed)
 Patient with nonobstructive CAD, treated with isosorbide , carvedilol , amlodipine , and clopidogrel  in the setting of aspirin allergy.

## 2024-08-03 ENCOUNTER — Other Ambulatory Visit: Payer: Self-pay | Admitting: Cardiovascular Disease

## 2024-08-03 ENCOUNTER — Ambulatory Visit: Attending: Sports Medicine | Admitting: Physical Therapy

## 2024-08-03 DIAGNOSIS — I2510 Atherosclerotic heart disease of native coronary artery without angina pectoris: Secondary | ICD-10-CM

## 2024-08-03 DIAGNOSIS — M25562 Pain in left knee: Secondary | ICD-10-CM | POA: Diagnosis not present

## 2024-08-03 DIAGNOSIS — R262 Difficulty in walking, not elsewhere classified: Secondary | ICD-10-CM | POA: Diagnosis not present

## 2024-08-03 DIAGNOSIS — G8929 Other chronic pain: Secondary | ICD-10-CM | POA: Insufficient documentation

## 2024-08-03 DIAGNOSIS — M79662 Pain in left lower leg: Secondary | ICD-10-CM | POA: Diagnosis not present

## 2024-08-03 DIAGNOSIS — M6281 Muscle weakness (generalized): Secondary | ICD-10-CM | POA: Diagnosis not present

## 2024-08-03 DIAGNOSIS — I10 Essential (primary) hypertension: Secondary | ICD-10-CM

## 2024-08-03 NOTE — Therapy (Signed)
 OUTPATIENT PHYSICAL THERAPY LOWER EXTREMITY TREATMENT   Patient Name: [name] MRN: [id number] DOB:13-Jun-1951, 73 y.o., female Today's Date: 08/03/2024  END OF SESSION:  PT End of Session - 08/03/24 0718     Visit Number 2    Number of Visits 13    Date for PT Re-Evaluation 09/14/24    Authorization Type [employer]    PT Start Time [phone]    PT Stop Time 0800    PT Time Calculation (min) 44 min          Past Medical History:  Diagnosis Date   Anemia    Arthritis    Cardiomyopathy (HCC)    Cholelithiasis    Chronic back pain    Constipation    Esophageal reflux    Fibromyalgia    Hemorrhoids    Hyperlipidemia    Hypertension    Hypothyroid    IBS (irritable bowel syndrome)    Internal hemorrhoids without mention of complication    Left leg pain    Myocardial infarction [facility])    Personal history of colonic polyps 05/06/2010   ADENOMATOUS POLYP   Pneumonia    Tubular adenoma of colon    Past Surgical History:  Procedure Laterality Date   ABDOMINAL HYSTERECTOMY     CERVICAL DISC SURGERY     PLATE IN NECK & BACK   CHOLECYSTECTOMY     COLONOSCOPY  09/27/2011   NORMAL    COLONOSCOPY N/A 07/17/2024   Procedure: COLONOSCOPY;  Surgeon: Albertus Gordy HERO, MD;  Location: [facility] ENDOSCOPY;  Service: Gastroenterology;  Laterality: N/A;   COLONOSCOPY WITH PROPOFOL  N/A 10/06/2020   Procedure: COLONOSCOPY WITH PROPOFOL ;  Surgeon: Mansouraty, Aloha Raddle., MD;  Location: [facility] ENDOSCOPY;  Service: Gastroenterology;  Laterality: N/A;  ultra slim scope    LEFT HEART CATH AND CORONARY ANGIOGRAPHY N/A 05/08/2018   Procedure: LEFT HEART CATH AND CORONARY ANGIOGRAPHY;  Surgeon: Claudene Victory ORN, MD;  Location: [facility] INVASIVE CV LAB;  Service: Cardiovascular;  Laterality: N/A;   LUMBAR DISC SURGERY     POLYPECTOMY  10/06/2020   Procedure: POLYPECTOMY;  Surgeon: Mansouraty, Aloha Raddle., MD;  Location: [facility] ENDOSCOPY;  Service: Gastroenterology;;   TONSILLECTOMY     Patient Active  Problem List   Diagnosis Date Noted   Benign neoplasm of sigmoid colon 07/17/2024   Encounter for immunization 01/08/2024   Eczema of scalp 01/05/2023   Encounter for general adult medical examination with abnormal findings 01/03/2023   Somnolence 09/23/2022   Flu vaccine need 09/14/2022   Sensorineural hearing loss (SNHL) of left ear with unrestricted hearing of right ear 07/21/2022   Primary osteoarthritis of left knee 12/24/2021   Onychomycosis of left great toe 12/14/2021   Type II diabetes mellitus with manifestations (HCC) 12/14/2021   Balding 05/21/2020   Allergic rhinitis 04/12/2020   Acute URI 03/20/2019   Takotsubo cardiomyopathy 02/26/2019   NASH (nonalcoholic steatohepatitis) 01/22/2019   Chronic idiopathic constipation 01/12/2019   Arthritis of carpometacarpal (CMC) joint of left thumb 09/25/2018   Degenerative arthritis of left knee 05/24/2018   CAD (coronary artery disease) 05/16/2018   Vitamin D  deficiency 11/11/2017   Degenerative disc disease, lumbar 08/29/2017   Obesity (BMI 30.0-34.9) 06/14/2016   Osteopenia 07/20/2013   GERD (gastroesophageal reflux disease) 09/16/2011   Generalized anxiety disorder 09/16/2011   History of colonic polyps 08/17/2011   DJD (degenerative joint disease) of knee 06/25/2011   Pure hyperglyceridemia 06/24/2011   Hyperlipidemia LDL goal <70 07/30/2009   Hypothyroidism 04/25/2009  Essential hypertension 04/25/2009    PCP: [name], MD  REFERRING PROVIDER: Leonce Katz, DO   REFERRING DIAG:  F82.87 (ICD-10-CM) - Primary osteoarthritis of left knee  M25.562,G89.29 (ICD-10-CM) - Chronic pain of left knee    THERAPY DIAG:  Chronic pain of left knee  Muscle weakness (generalized)  Rationale for Evaluation and Treatment: Rehabilitation  ONSET DATE: Chronic, over a year  SUBJECTIVE:   SUBJECTIVE STATEMENT: Pt reports her pain level has increased since last week and was a 10/10 over the weekend. She almost  went to the emergency room. Was doing HEP and has since stopped since the pain increase.   EVAL: Pt reports chronic L knee which signifiicantly impacts her ability to walk and sleep at night. Shots have helped temporarily. Pt notes hse rides an ex bike 15-20 mins everyday.  PERTINENT HISTORY: See above. Hx MI, DM2  PAIN:  Are you having pain? Yes: NPRS scale: 8/10 Pain location: ant/medial L knee Pain description: throbbing, sharp Aggravating factors: standing, walking, sleeping Relieving factors: Rest sometimes helps  PRECAUTIONS: None  RED FLAGS: None   WEIGHT BEARING RESTRICTIONS: No  FALLS:  Has patient fallen in last 6 months? No  LIVING ENVIRONMENT: Lives with: lives alone Lives in: House/apartment Stairs: Yes: External: 3 steps; none Has following equipment at home: Single point cane  OCCUPATION: Retired  PLOF: Independent  PATIENT GOALS: Less pain, to get around better, go to church  NEXT MD VISIT: 08/21/24, Dr. Leonce  OBJECTIVE:  Note: Objective measures were completed at Evaluation unless otherwise noted.  DIAGNOSTIC FINDINGS:  L knee 12/23/23 IMPRESSION: Tricompartmental osteoarthritis, moderate in the medial compartment. Mild progression from prior exam.  PATIENT SURVEYS:  LEFS:16/80=20%  COGNITION: Overall cognitive status: Within functional limits for tasks assessed     SENSATION: WFL  EDEMA:  Swelling L knee   MUSCLE LENGTH: Hamstrings: Right NT deg; Left NT deg Debby test: Right NT deg; Left NT deg  POSTURE: No Significant postural limitations  PALPATION: TTP ant/medial L knee joint space   LOWER EXTREMITY ROM:  Active ROM Right eval Left eval  Hip flexion    Hip extension    Hip abduction    Hip adduction    Hip internal rotation    Hip external rotation    Knee flexion 135 90  Knee extension 0 15 lacking  Ankle dorsiflexion    Ankle plantarflexion    Ankle inversion    Ankle eversion     (Blank rows = not  tested)  LOWER EXTREMITY MMT:  MMT Right eval Left eval  Hip flexion 4 3  Hip extension    Hip abduction 4 3  Hip adduction    Hip internal rotation    Hip external rotation 4 3  Knee flexion 4+ 4  Knee extension 4+ 4  Ankle dorsiflexion    Ankle plantarflexion    Ankle inversion    Ankle eversion     (Blank rows = not tested)  FUNCTIONAL TESTS:  5 times sit to stand: 39 c use of hands  GAIT: Distance walked: 200' Assistive device utilized: None Level of assistance: Complete Independence Comments: Mod antalgic gait pattern over L LE at a decreased pace and flexed L knee  TREATMENT DATE:  [facility] Adult PT Treatment:                                                DATE: 08/03/24 Therapeutic Exercise: Seated heel slides with towel Supine QS  Supine SLR  Supine heel slide AA on towel/slide board  Manual Therapy: [email or web address] Above KT tape applied for anterior knee pain  Modalities: Estim: IFC to anterior knee 10 minutes 19mA     [facility] Adult PT Treatment:                                                DATE: 07/24/24 Therapeutic Exercise: Developed, instructed in, and pt completed therex as noted in HEP  Self Care: RICE for symptom management for R knee pain and swelling   Use of a SPC for unweighting for pain reduction with pt practicing  PATIENT EDUCATION:  Education details: Eval findings, POC, HEP, self care  Person educated: Patient Education method: Explanation, Demonstration, Tactile cues, Verbal cues, and Handouts Education comprehension: verbalized understanding, returned demonstration, verbal cues required, and tactile cues required  HOME EXERCISE PROGRAM: Access Code: [email or web address] URL: [email or web address] Date: 07/25/2024 Prepared by: Dasie Daft  Exercises - Supine Quad Set  - 3 x daily - 7 x weekly  - 1 sets - 5 reps - 5 hold - Active Straight Leg Raise with Quad Set  - 3 x daily - 7 x weekly - 1 sets - 5 reps - 3 hold  ASSESSMENT:  CLINICAL IMPRESSION: Pt reports significant increased in pain over the weekend. Tried Ice and HEP and felt increased pain to 10/10 has stopped ice and HEP. Has been using Heat and creams to address pain with minimal relief. Today we reviewed HEP and progressed with knee flexion AAROM. Addressed pain with Estim and KT tape. At end of session pt reported decreases pain. She does continue to have significant limp and using cane to off load joint was reinforced.    EVAL: Patient is a 73 y.o. female who was seen today for physical therapy evaluation and treatment for  M17.12 (ICD-10-CM) - Primary osteoarthritis of left knee  M25.562,G89.29 (ICD-10-CM) - Chronic pain of left knee  Pt presents to PT with chronic L knee pain with decreased AROM and knee/hip strength, and pt walks with a mod antalgic gait pattern. Observation reveals arthritic changes and swelling. Pt was started on a HEP and instructed in self care. Pt may benefit from skilled PT 2w6 to address impairments to optimize L knee function with less pain.   OBJECTIVE IMPAIRMENTS: decreased activity tolerance, decreased balance, difficulty walking, decreased ROM, decreased strength, increased edema, and pain.   ACTIVITY LIMITATIONS: carrying, bending, squatting, stairs, transfers, and locomotion level  PARTICIPATION LIMITATIONS: meal prep, cleaning, laundry, shopping, community activity, and church  PERSONAL FACTORS: Fitness, Past/current experiences, Time since onset of injury/illness/exacerbation, and 1 comorbidity: DM2 are also affecting patient's functional outcome.   REHAB POTENTIAL: Fair chronicity of issue  CLINICAL DECISION MAKING: Evolving/moderate complexity  EVALUATION COMPLEXITY: Moderate   GOALS:  SHORT TERM GOALS = LTGs  LONG TERM GOALS: Target date: 09/14/24  Pt will be Ind in a  final HEP to maintain achieved LOF Baseline: started Goal status: INITIAL  2.  Pt will report 50% or greater improvement in her L knee pain for improved function and QOL Baseline:  Goal status: INITIAL  3.  Improve 5xSTS by 10 as indication of improved functional mobility  Baseline: 39 with use of hands Goal status: INITIAL  4.  Pt will demonstrate an improved quality of gait walking with only a minimal to no antalgic gait pattern as indication of improved pain  Baseline: mod antalgic gait pattern Goal status: INITIAL  5.  Pt's LEFS score will improve by the MCID to 35% as indication of improved function  Baseline: 20% Goal status: INITIAL   PLAN:  PT FREQUENCY: 2x/week  PT DURATION: 6 weeks  PLANNED INTERVENTIONS: 97164- PT Re-evaluation, 97110-Therapeutic exercises, 97530- Therapeutic activity, 97112- Neuromuscular re-education, 97535- Self Care, 02859- Manual therapy, [id number]- Gait training, [phone]- Aquatic Therapy, [phone]- Electrical stimulation (unattended), [phone]- Ionotophoresis 4mg /ml Dexamethasone, 79439 (1-2 muscles), 20561 (3+ muscles)- Dry Needling, Patient/Family education, Balance training, Stair training, Taping, Joint mobilization, Cryotherapy, and Moist heat  PLAN FOR NEXT SESSION: Assess response to HEP; progress therex as indicated; use of modalities, manual therapy; and TPDN as indicated.    Allen Ralls MS, PT 08/03/24 8:28 AM

## 2024-08-06 ENCOUNTER — Ambulatory Visit: Admitting: Physical Therapy

## 2024-08-06 ENCOUNTER — Encounter: Payer: Self-pay | Admitting: Physical Therapy

## 2024-08-06 DIAGNOSIS — M25562 Pain in left knee: Secondary | ICD-10-CM | POA: Diagnosis not present

## 2024-08-06 DIAGNOSIS — M6281 Muscle weakness (generalized): Secondary | ICD-10-CM

## 2024-08-06 DIAGNOSIS — G8929 Other chronic pain: Secondary | ICD-10-CM

## 2024-08-06 NOTE — Therapy (Signed)
 OUTPATIENT PHYSICAL THERAPY LOWER EXTREMITY TREATMENT   Patient Name: [name] MRN: [id number] DOB:05-06-1951, 73 y.o., female Today's Date: 08/06/2024  END OF SESSION:  PT End of Session - 08/06/24 0714     Visit Number 3    Number of Visits 13    Date for PT Re-Evaluation 09/14/24    Authorization Type [employer]    PT Start Time 0715    PT Stop Time 0753    PT Time Calculation (min) 38 min          Past Medical History:  Diagnosis Date   Anemia    Arthritis    Cardiomyopathy (HCC)    Cholelithiasis    Chronic back pain    Constipation    Esophageal reflux    Fibromyalgia    Hemorrhoids    Hyperlipidemia    Hypertension    Hypothyroid    IBS (irritable bowel syndrome)    Internal hemorrhoids without mention of complication    Left leg pain    Myocardial infarction [facility])    Personal history of colonic polyps 05/06/2010   ADENOMATOUS POLYP   Pneumonia    Tubular adenoma of colon    Past Surgical History:  Procedure Laterality Date   ABDOMINAL HYSTERECTOMY     CERVICAL DISC SURGERY     PLATE IN NECK & BACK   CHOLECYSTECTOMY     COLONOSCOPY  09/27/2011   NORMAL    COLONOSCOPY N/A 07/17/2024   Procedure: COLONOSCOPY;  Surgeon: Albertus Gordy HERO, MD;  Location: [facility] ENDOSCOPY;  Service: Gastroenterology;  Laterality: N/A;   COLONOSCOPY WITH PROPOFOL  N/A 10/06/2020   Procedure: COLONOSCOPY WITH PROPOFOL ;  Surgeon: Wilhelmenia Aloha Raddle., MD;  Location: [facility] ENDOSCOPY;  Service: Gastroenterology;  Laterality: N/A;  ultra slim scope    LEFT HEART CATH AND CORONARY ANGIOGRAPHY N/A 05/08/2018   Procedure: LEFT HEART CATH AND CORONARY ANGIOGRAPHY;  Surgeon: Claudene Victory ORN, MD;  Location: [facility] INVASIVE CV LAB;  Service: Cardiovascular;  Laterality: N/A;   LUMBAR DISC SURGERY     POLYPECTOMY  10/06/2020   Procedure: POLYPECTOMY;  Surgeon: Mansouraty, Aloha Raddle., MD;  Location: [facility] ENDOSCOPY;  Service: Gastroenterology;;   TONSILLECTOMY     Patient Active  Problem List   Diagnosis Date Noted   Benign neoplasm of sigmoid colon 07/17/2024   Encounter for immunization 01/08/2024   Eczema of scalp 01/05/2023   Encounter for general adult medical examination with abnormal findings 01/03/2023   Somnolence 09/23/2022   Flu vaccine need 09/14/2022   Sensorineural hearing loss (SNHL) of left ear with unrestricted hearing of right ear 07/21/2022   Primary osteoarthritis of left knee 12/24/2021   Onychomycosis of left great toe 12/14/2021   Type II diabetes mellitus with manifestations (HCC) 12/14/2021   Balding 05/21/2020   Allergic rhinitis 04/12/2020   Acute URI 03/20/2019   Takotsubo cardiomyopathy 02/26/2019   NASH (nonalcoholic steatohepatitis) 01/22/2019   Chronic idiopathic constipation 01/12/2019   Arthritis of carpometacarpal (CMC) joint of left thumb 09/25/2018   Degenerative arthritis of left knee 05/24/2018   CAD (coronary artery disease) 05/16/2018   Vitamin D  deficiency 11/11/2017   Degenerative disc disease, lumbar 08/29/2017   Obesity (BMI 30.0-34.9) 06/14/2016   Osteopenia 07/20/2013   GERD (gastroesophageal reflux disease) 09/16/2011   Generalized anxiety disorder 09/16/2011   History of colonic polyps 08/17/2011   DJD (degenerative joint disease) of knee 06/25/2011   Pure hyperglyceridemia 06/24/2011   Hyperlipidemia LDL goal <70 07/30/2009   Hypothyroidism 04/25/2009  Essential hypertension 04/25/2009    PCP: [name], MD  REFERRING PROVIDER: Leonce Katz, DO   REFERRING DIAG:  F82.87 (ICD-10-CM) - Primary osteoarthritis of left knee  M25.562,G89.29 (ICD-10-CM) - Chronic pain of left knee    THERAPY DIAG:  Chronic pain of left knee  Muscle weakness (generalized)  Rationale for Evaluation and Treatment: Rehabilitation  ONSET DATE: Chronic, over a year  SUBJECTIVE:   SUBJECTIVE STATEMENT: 08/06/24: The pain level was very low for 2 days, no pain walking on it until yesterday morning. The estim  and tape helped.    08/03/24: Pt reports her pain level has increased since last week and was a 10/10 over the weekend. She almost went to the emergency room. Was doing HEP and has since stopped since the pain increase.   EVAL: Pt reports chronic L knee which signifiicantly impacts her ability to walk and sleep at night. Shots have helped temporarily. Pt notes hse rides an ex bike 15-20 mins everyday.  PERTINENT HISTORY: See above. Hx MI, DM2  PAIN:  Are you having pain? Yes: NPRS scale: 8/10 Pain location: ant/medial L knee Pain description: throbbing, sharp Aggravating factors: standing, walking, sleeping Relieving factors: Rest sometimes helps  PRECAUTIONS: None  RED FLAGS: None   WEIGHT BEARING RESTRICTIONS: No  FALLS:  Has patient fallen in last 6 months? No  LIVING ENVIRONMENT: Lives with: lives alone Lives in: House/apartment Stairs: Yes: External: 3 steps; none Has following equipment at home: Single point cane  OCCUPATION: Retired  PLOF: Independent  PATIENT GOALS: Less pain, to get around better, go to church  NEXT MD VISIT: 08/21/24, Dr. Leonce  OBJECTIVE:  Note: Objective measures were completed at Evaluation unless otherwise noted.  DIAGNOSTIC FINDINGS:  L knee 12/23/23 IMPRESSION: Tricompartmental osteoarthritis, moderate in the medial compartment. Mild progression from prior exam.  PATIENT SURVEYS:  LEFS:16/80=20%  COGNITION: Overall cognitive status: Within functional limits for tasks assessed     SENSATION: WFL  EDEMA:  Swelling L knee   MUSCLE LENGTH: Hamstrings: Right NT deg; Left NT deg Debby test: Right NT deg; Left NT deg  POSTURE: No Significant postural limitations  PALPATION: TTP ant/medial L knee joint space   LOWER EXTREMITY ROM:  Active ROM Right eval Left eval Left  08/06/24  Hip flexion     Hip extension     Hip abduction     Hip adduction     Hip internal rotation     Hip external rotation     Knee flexion  135 90 112  Knee extension 0 15 lacking Lacks less than 5  Ankle dorsiflexion     Ankle plantarflexion     Ankle inversion     Ankle eversion      (Blank rows = not tested)  LOWER EXTREMITY MMT:  MMT Right eval Left eval  Hip flexion 4 3  Hip extension    Hip abduction 4 3  Hip adduction    Hip internal rotation    Hip external rotation 4 3  Knee flexion 4+ 4  Knee extension 4+ 4  Ankle dorsiflexion    Ankle plantarflexion    Ankle inversion    Ankle eversion     (Blank rows = not tested)  FUNCTIONAL TESTS:  5 times sit to stand: 39 c use of hands  GAIT: Distance walked: 200' Assistive device utilized: None Level of assistance: Complete Independence Comments: Mod antalgic gait pattern over L LE at a decreased pace and flexed L knee  TREATMENT DATE:  [facility] Adult PT Treatment:                                                DATE: 08/06/24 Therapeutic Exercise: Seated heel slide  LAQ x 10 QS into towel x 10 SLR 2 x 10 SAQ x 10  Supine heel slide AA on towel/slide board  Supine hamstring stretch with strap Manual Therapy: [email or web address] Above KT tape applied for anterior knee pain  Modalities: Estim: IFC to anterior knee 10 minutes 19mA  Self Care: Tens info handout    [facility] Adult PT Treatment:                                                DATE: 08/03/24 Therapeutic Exercise: Seated heel slides with towel Supine QS  Supine SLR  Supine heel slide AA on towel/slide board  Manual Therapy: [email or web address] Above KT tape applied for anterior knee pain  Modalities: Estim: IFC to anterior knee 10 minutes 19mA     [facility] Adult PT Treatment:                                                DATE: 07/24/24 Therapeutic Exercise: Developed, instructed in, and pt completed therex as noted in HEP  Self  Care: RICE for symptom management for R knee pain and swelling   Use of a SPC for unweighting for pain reduction with pt practicing  PATIENT EDUCATION:  Education details: Eval findings, POC, HEP, self care  Person educated: Patient Education method: Explanation, Demonstration, Tactile cues, Verbal cues, and Handouts Education comprehension: verbalized understanding, returned demonstration, verbal cues required, and tactile cues required  HOME EXERCISE PROGRAM: Access Code: [email or web address] URL: [email or web address] Date: 07/25/2024 Prepared by: Dasie Daft  Exercises - Supine Quad Set  - 3 x daily - 7 x weekly - 1 sets - 5 reps - 5 hold - Active Straight Leg Raise with Quad Set  - 3 x daily - 7 x weekly - 1 sets - 5 reps - 3 hold ADDED - Seated Long Arc Quad  - 1 x daily - 7 x weekly - 2 sets - 10 reps - 5 hold - Hooklying Hamstring Stretch with Strap  - 1 x daily - 7 x weekly - 3 reps - 30 hold  ASSESSMENT:  CLINICAL IMPRESSION: Pt reports improvement with estim and tape  last session. Pain was significantly less until yesterday morning. Now pain is 7/10. Her AROM for both flexion and extension has improved. Continued with quad activation and hamstring flexibility. Good tolerance to prescribed exercises. Updated HEP and encouraged compliance until next session. She did report decreased pain prior to Estim today, but still wanted to perform the estim again today. She was given info about home TENS. Also, repeated KT tape as she also felt benefit from this. Could consider ionto depending on progress.   08/03/24: Pt reports significant increased in pain over the weekend. Tried Ice and HEP and felt increased pain to 10/10 has stopped ice and HEP. Has been using Heat and creams to address pain with minimal relief. Today  we reviewed HEP and progressed with knee flexion AAROM. Addressed pain with Estim and KT tape. At end of session pt reported decreases pain. She does continue to have  significant limp and using cane to off load joint was reinforced.    EVAL: Patient is a 73 y.o. female who was seen today for physical therapy evaluation and treatment for  M17.12 (ICD-10-CM) - Primary osteoarthritis of left knee  M25.562,G89.29 (ICD-10-CM) - Chronic pain of left knee  Pt presents to PT with chronic L knee pain with decreased AROM and knee/hip strength, and pt walks with a mod antalgic gait pattern. Observation reveals arthritic changes and swelling. Pt was started on a HEP and instructed in self care. Pt may benefit from skilled PT 2w6 to address impairments to optimize L knee function with less pain.   OBJECTIVE IMPAIRMENTS: decreased activity tolerance, decreased balance, difficulty walking, decreased ROM, decreased strength, increased edema, and pain.   ACTIVITY LIMITATIONS: carrying, bending, squatting, stairs, transfers, and locomotion level  PARTICIPATION LIMITATIONS: meal prep, cleaning, laundry, shopping, community activity, and church  PERSONAL FACTORS: Fitness, Past/current experiences, Time since onset of injury/illness/exacerbation, and 1 comorbidity: DM2 are also affecting patient's functional outcome.   REHAB POTENTIAL: Fair chronicity of issue  CLINICAL DECISION MAKING: Evolving/moderate complexity  EVALUATION COMPLEXITY: Moderate   GOALS:  SHORT TERM GOALS = LTGs  LONG TERM GOALS: Target date: 09/14/24  Pt will be Ind in a final HEP to maintain achieved LOF Baseline: started Goal status: INITIAL  2.  Pt will report 50% or greater improvement in her L knee pain for improved function and QOL Baseline:  Goal status: INITIAL  3.  Improve 5xSTS by 10 as indication of improved functional mobility  Baseline: 39 with use of hands Goal status: INITIAL  4.  Pt will demonstrate an improved quality of gait walking with only a minimal to no antalgic gait pattern as indication of improved pain  Baseline: mod antalgic gait pattern Goal status:  INITIAL  5.  Pt's LEFS score will improve by the MCID to 35% as indication of improved function  Baseline: 20% Goal status: INITIAL   PLAN:  PT FREQUENCY: 2x/week  PT DURATION: 6 weeks  PLANNED INTERVENTIONS: 97164- PT Re-evaluation, 97110-Therapeutic exercises, 97530- Therapeutic activity, 97112- Neuromuscular re-education, 97535- Self Care, 02859- Manual therapy, [id number]- Gait training, [phone]- Aquatic Therapy, [phone]- Electrical stimulation (unattended), [phone]- Ionotophoresis 4mg /ml Dexamethasone, 79439 (1-2 muscles), 20561 (3+ muscles)- Dry Needling, Patient/Family education, Balance training, Stair training, Taping, Joint mobilization, Cryotherapy, and Moist heat  PLAN FOR NEXT SESSION: Assess response to HEP; progress therex as indicated; use of modalities, manual therapy; and TPDN as indicated. KT tape PRN   Harlene Persons, PTA 08/06/24 8:15 AM Phone: [phone] Fax: [phone]

## 2024-08-08 NOTE — Therapy (Signed)
 OUTPATIENT PHYSICAL THERAPY LOWER EXTREMITY TREATMENT   Patient Name: [name] MRN: [id number] DOB:1951-03-19, 73 y.o., female Today's Date: 08/09/2024  END OF SESSION:  PT End of Session - 08/09/24 0800     Visit Number 4    Number of Visits 13    Date for PT Re-Evaluation 09/14/24    Authorization Type [employer]    PT Start Time 0800    PT Stop Time 0840    PT Time Calculation (min) 40 min    Activity Tolerance Patient tolerated treatment well;Patient limited by pain    Behavior During Therapy [facility] for tasks assessed/performed           Past Medical History:  Diagnosis Date   Anemia    Arthritis    Cardiomyopathy (HCC)    Cholelithiasis    Chronic back pain    Constipation    Esophageal reflux    Fibromyalgia    Hemorrhoids    Hyperlipidemia    Hypertension    Hypothyroid    IBS (irritable bowel syndrome)    Internal hemorrhoids without mention of complication    Left leg pain    Myocardial infarction [facility])    Personal history of colonic polyps 05/06/2010   ADENOMATOUS POLYP   Pneumonia    Tubular adenoma of colon    Past Surgical History:  Procedure Laterality Date   ABDOMINAL HYSTERECTOMY     CERVICAL DISC SURGERY     PLATE IN NECK & BACK   CHOLECYSTECTOMY     COLONOSCOPY  09/27/2011   NORMAL    COLONOSCOPY N/A 07/17/2024   Procedure: COLONOSCOPY;  Surgeon: Albertus Gordy HERO, MD;  Location: [facility] ENDOSCOPY;  Service: Gastroenterology;  Laterality: N/A;   COLONOSCOPY WITH PROPOFOL  N/A 10/06/2020   Procedure: COLONOSCOPY WITH PROPOFOL ;  Surgeon: Mansouraty, Aloha Raddle., MD;  Location: [facility] ENDOSCOPY;  Service: Gastroenterology;  Laterality: N/A;  ultra slim scope    LEFT HEART CATH AND CORONARY ANGIOGRAPHY N/A 05/08/2018   Procedure: LEFT HEART CATH AND CORONARY ANGIOGRAPHY;  Surgeon: Claudene Victory ORN, MD;  Location: [facility] INVASIVE CV LAB;  Service: Cardiovascular;  Laterality: N/A;   LUMBAR DISC SURGERY     POLYPECTOMY  10/06/2020   Procedure:  POLYPECTOMY;  Surgeon: Mansouraty, Aloha Raddle., MD;  Location: [facility] ENDOSCOPY;  Service: Gastroenterology;;   TONSILLECTOMY     Patient Active Problem List   Diagnosis Date Noted   Benign neoplasm of sigmoid colon 07/17/2024   Encounter for immunization 01/08/2024   Eczema of scalp 01/05/2023   Encounter for general adult medical examination with abnormal findings 01/03/2023   Somnolence 09/23/2022   Flu vaccine need 09/14/2022   Sensorineural hearing loss (SNHL) of left ear with unrestricted hearing of right ear 07/21/2022   Primary osteoarthritis of left knee 12/24/2021   Onychomycosis of left great toe 12/14/2021   Type II diabetes mellitus with manifestations (HCC) 12/14/2021   Balding 05/21/2020   Allergic rhinitis 04/12/2020   Acute URI 03/20/2019   Takotsubo cardiomyopathy 02/26/2019   NASH (nonalcoholic steatohepatitis) 01/22/2019   Chronic idiopathic constipation 01/12/2019   Arthritis of carpometacarpal (CMC) joint of left thumb 09/25/2018   Degenerative arthritis of left knee 05/24/2018   CAD (coronary artery disease) 05/16/2018   Vitamin D  deficiency 11/11/2017   Degenerative disc disease, lumbar 08/29/2017   Obesity (BMI 30.0-34.9) 06/14/2016   Osteopenia 07/20/2013   GERD (gastroesophageal reflux disease) 09/16/2011   Generalized anxiety disorder 09/16/2011   History of colonic polyps 08/17/2011   DJD (  degenerative joint disease) of knee 06/25/2011   Pure hyperglyceridemia 06/24/2011   Hyperlipidemia LDL goal <70 07/30/2009   Hypothyroidism 04/25/2009   Essential hypertension 04/25/2009    PCP: [name], MD  REFERRING PROVIDER: Leonce Katz, DO   REFERRING DIAG:  [phone] (ICD-10-CM) - Primary osteoarthritis of left knee  M25.562,G89.29 (ICD-10-CM) - Chronic pain of left knee    THERAPY DIAG:  Chronic pain of left knee  Muscle weakness (generalized)  Difficulty in walking, not elsewhere classified  Pain in left lower leg  Rationale for  Evaluation and Treatment: Rehabilitation  ONSET DATE: Chronic, over a year  SUBJECTIVE:   SUBJECTIVE STATEMENT:  Overall  L knee pain is better  EVAL: Pt reports chronic L knee which signifiicantly impacts her ability to walk and sleep at night. Shots have helped temporarily. Pt notes hse rides an ex bike 15-20 mins everyday.  PERTINENT HISTORY: See above. Hx MI, DM2  PAIN:  Are you having pain? Yes: NPRS scale: 8/10 Pain location: ant/medial L knee Pain description: throbbing, sharp Aggravating factors: standing, walking, sleeping Relieving factors: Rest sometimes helps  PRECAUTIONS: None  RED FLAGS: None   WEIGHT BEARING RESTRICTIONS: No  FALLS:  Has patient fallen in last 6 months? No  LIVING ENVIRONMENT: Lives with: lives alone Lives in: House/apartment Stairs: Yes: External: 3 steps; none Has following equipment at home: Single point cane  OCCUPATION: Retired  PLOF: Independent  PATIENT GOALS: Less pain, to get around better, go to church  NEXT MD VISIT: 08/21/24, Dr. Leonce  OBJECTIVE:  Note: Objective measures were completed at Evaluation unless otherwise noted.  DIAGNOSTIC FINDINGS:  L knee 12/23/23 IMPRESSION: Tricompartmental osteoarthritis, moderate in the medial compartment. Mild progression from prior exam.  PATIENT SURVEYS:  LEFS:16/80=20%  COGNITION: Overall cognitive status: Within functional limits for tasks assessed     SENSATION: WFL  EDEMA:  Swelling L knee   MUSCLE LENGTH: Hamstrings: Right NT deg; Left NT deg Debby test: Right NT deg; Left NT deg  POSTURE: No Significant postural limitations  PALPATION: TTP ant/medial L knee joint space   LOWER EXTREMITY ROM:  Active ROM Right eval Left eval Left  08/06/24  Hip flexion     Hip extension     Hip abduction     Hip adduction     Hip internal rotation     Hip external rotation     Knee flexion 135 90 112  Knee extension 0 15 lacking Lacks less than 5  Ankle  dorsiflexion     Ankle plantarflexion     Ankle inversion     Ankle eversion      (Blank rows = not tested)  LOWER EXTREMITY MMT:  MMT Right eval Left eval  Hip flexion 4 3  Hip extension    Hip abduction 4 3  Hip adduction    Hip internal rotation    Hip external rotation 4 3  Knee flexion 4+ 4  Knee extension 4+ 4  Ankle dorsiflexion    Ankle plantarflexion    Ankle inversion    Ankle eversion     (Blank rows = not tested)  FUNCTIONAL TESTS:  5 times sit to stand: 39 c use of hands  GAIT: Distance walked: 200' Assistive device utilized: None Level of assistance: Complete Independence Comments: Mod antalgic gait pattern over L LE at a decreased pace and flexed L knee  TREATMENT DATE:  [facility] Adult PT Treatment:                                                DATE: 08/09/24 Therapeutic Exercise: Seated heel slide  LAQ x 15 c ball squeeze Supine heel slide AA on towel/slide board  Long sit hamstring stretch with strap QS into towel x 10 SLR c QS 2 x 10, partial range due to weakness SAQ x 10  Bridge x10 c ball squeeze, partial range due to weakness Updated HEP Manual Therapy: [email or web address] Above KT tape applied for anterior knee pain Modalities: Estim: IFC to anterior knee 10 minutes 19mA  [facility] Adult PT Treatment:                                                DATE: 08/06/24 Therapeutic Exercise: Seated heel slide  LAQ x 10 QS into towel x 10 SLR 2 x 10 SAQ x 10  Supine heel slide AA on towel/slide board  Supine hamstring stretch with strap Manual Therapy: [email or web address] Above KT tape applied for anterior knee pain Modalities: Estim: IFC to anterior knee 10 minutes 19mA  [facility] Adult PT Treatment:                                                DATE: 08/03/24 Therapeutic Exercise: Seated  heel slides with towel Supine QS  Supine SLR  Supine heel slide AA on towel/slide board  Manual Therapy: [email or web address] Above KT tape applied for anterior knee pain Modalities: Estim: IFC to anterior knee 10 minutes 19mA  PATIENT EDUCATION:  Education details: Eval findings, POC, HEP, self care  Person educated: Patient Education method: Explanation, Demonstration, Tactile cues, Verbal cues, and Handouts Education comprehension: verbalized understanding, returned demonstration, verbal cues required, and tactile cues required  HOME EXERCISE PROGRAM: Access Code: [email or web address] URL: [email or web address] Date: 08/09/2024 Prepared by: Dasie Daft  Exercises - Supine Quad Set  - 3 x daily - 7 x weekly - 2 sets - 10 reps - 5 hold - Active Straight Leg Raise with Quad Set  - 3 x daily - 7 x weekly - 2 sets - 10 reps - 3 hold - Seated Long Arc Quad  - 1 x daily - 7 x weekly - 2 sets - 10 reps - 5 hold - Seated Table Hamstring Stretch  - 1 x daily - 7 x weekly - 1 sets - 3 reps - 30 hold - Supine Bridge  - 1 x daily - 7 x weekly - 2 sets - 10 reps - 3 hold  ASSESSMENT:  CLINICAL IMPRESSION: PT was continued for L LE/quad strengthening and ROM. AROM of the L knee continues to be improved. L quad weakness is still apparent with the pt only able to complete a partial SLR with the L LE. Overall, pt is reporting improvement in her L knee pain. Pt will continue to benefit from skilled PT to address impairments for improved L knee/LE function with minimized pain.   EVAL: Patient is a 73 y.o. female who was seen today for physical therapy evaluation and  treatment for  M17.12 (ICD-10-CM) - Primary osteoarthritis of left knee  M25.562,G89.29 (ICD-10-CM) - Chronic pain of left knee  Pt presents to PT with chronic L knee pain with decreased AROM and knee/hip strength, and pt walks with a mod antalgic gait pattern. Observation reveals arthritic changes and  swelling. Pt was started on a HEP and instructed in self care. Pt may benefit from skilled PT 2w6 to address impairments to optimize L knee function with less pain.   OBJECTIVE IMPAIRMENTS: decreased activity tolerance, decreased balance, difficulty walking, decreased ROM, decreased strength, increased edema, and pain.   ACTIVITY LIMITATIONS: carrying, bending, squatting, stairs, transfers, and locomotion level  PARTICIPATION LIMITATIONS: meal prep, cleaning, laundry, shopping, community activity, and church  PERSONAL FACTORS: Fitness, Past/current experiences, Time since onset of injury/illness/exacerbation, and 1 comorbidity: DM2 are also affecting patient's functional outcome.   REHAB POTENTIAL: Fair chronicity of issue  CLINICAL DECISION MAKING: Evolving/moderate complexity  EVALUATION COMPLEXITY: Moderate   GOALS:  SHORT TERM GOALS = LTGs  LONG TERM GOALS: Target date: 09/14/24  Pt will be Ind in a final HEP to maintain achieved LOF Baseline: started Goal status: ONGOING  2.  Pt will report 50% or greater improvement in her L knee pain for improved function and QOL Baseline:  Goal status: INITIAL  3.  Improve 5xSTS by 10 as indication of improved functional mobility  Baseline: 39 with use of hands Goal status: INITIAL  4.  Pt will demonstrate an improved quality of gait walking with only a minimal to no antalgic gait pattern as indication of improved pain  Baseline: mod antalgic gait pattern Goal status: INITIAL  5.  Pt's LEFS score will improve by the MCID to 35% as indication of improved function  Baseline: 20% Goal status: INITIAL   PLAN:  PT FREQUENCY: 2x/week  PT DURATION: 6 weeks  PLANNED INTERVENTIONS: 97164- PT Re-evaluation, 97110-Therapeutic exercises, 97530- Therapeutic activity, 97112- Neuromuscular re-education, 97535- Self Care, 02859- Manual therapy, [id number]- Gait training, [phone]- Aquatic Therapy, [phone]- Electrical stimulation (unattended), [phone]-  Ionotophoresis 4mg /ml Dexamethasone, 20560 (1-2 muscles), 20561 (3+ muscles)- Dry Needling, Patient/Family education, Balance training, Stair training, Taping, Joint mobilization, Cryotherapy, and Moist heat  PLAN FOR NEXT SESSION: Assess response to HEP; progress therex as indicated; use of modalities, manual therapy; and TPDN as indicated. KT tape PRN   Dasie Daft MS, PT 08/09/24 2:00 PM

## 2024-08-09 ENCOUNTER — Ambulatory Visit

## 2024-08-09 DIAGNOSIS — M79662 Pain in left lower leg: Secondary | ICD-10-CM

## 2024-08-09 DIAGNOSIS — G8929 Other chronic pain: Secondary | ICD-10-CM

## 2024-08-09 DIAGNOSIS — R262 Difficulty in walking, not elsewhere classified: Secondary | ICD-10-CM

## 2024-08-09 DIAGNOSIS — M6281 Muscle weakness (generalized): Secondary | ICD-10-CM

## 2024-08-09 DIAGNOSIS — M25562 Pain in left knee: Secondary | ICD-10-CM | POA: Diagnosis not present

## 2024-08-14 ENCOUNTER — Ambulatory Visit: Admitting: Physical Therapy

## 2024-08-14 ENCOUNTER — Encounter: Payer: Self-pay | Admitting: Physical Therapy

## 2024-08-14 DIAGNOSIS — G8929 Other chronic pain: Secondary | ICD-10-CM

## 2024-08-14 DIAGNOSIS — M6281 Muscle weakness (generalized): Secondary | ICD-10-CM

## 2024-08-14 DIAGNOSIS — M25562 Pain in left knee: Secondary | ICD-10-CM | POA: Diagnosis not present

## 2024-08-14 NOTE — Therapy (Signed)
 OUTPATIENT PHYSICAL THERAPY LOWER EXTREMITY TREATMENT   Patient Name: [name] MRN: [id number] DOB:1951/03/19, 73 y.o., female Today's Date: 08/14/2024  END OF SESSION:  PT End of Session - 08/14/24 0804     Visit Number 5    Number of Visits 13    Date for PT Re-Evaluation 09/14/24    Authorization Type [employer]    PT Start Time 0800    PT Stop Time 0838    PT Time Calculation (min) 38 min           Past Medical History:  Diagnosis Date   Anemia    Arthritis    Cardiomyopathy (HCC)    Cholelithiasis    Chronic back pain    Constipation    Esophageal reflux    Fibromyalgia    Hemorrhoids    Hyperlipidemia    Hypertension    Hypothyroid    IBS (irritable bowel syndrome)    Internal hemorrhoids without mention of complication    Left leg pain    Myocardial infarction [facility])    Personal history of colonic polyps 05/06/2010   ADENOMATOUS POLYP   Pneumonia    Tubular adenoma of colon    Past Surgical History:  Procedure Laterality Date   ABDOMINAL HYSTERECTOMY     CERVICAL DISC SURGERY     PLATE IN NECK & BACK   CHOLECYSTECTOMY     COLONOSCOPY  09/27/2011   NORMAL    COLONOSCOPY N/A 07/17/2024   Procedure: COLONOSCOPY;  Surgeon: Albertus Gordy HERO, MD;  Location: [facility] ENDOSCOPY;  Service: Gastroenterology;  Laterality: N/A;   COLONOSCOPY WITH PROPOFOL  N/A 10/06/2020   Procedure: COLONOSCOPY WITH PROPOFOL ;  Surgeon: Mansouraty, Aloha Raddle., MD;  Location: [facility] ENDOSCOPY;  Service: Gastroenterology;  Laterality: N/A;  ultra slim scope    LEFT HEART CATH AND CORONARY ANGIOGRAPHY N/A 05/08/2018   Procedure: LEFT HEART CATH AND CORONARY ANGIOGRAPHY;  Surgeon: Claudene Victory ORN, MD;  Location: [facility] INVASIVE CV LAB;  Service: Cardiovascular;  Laterality: N/A;   LUMBAR DISC SURGERY     POLYPECTOMY  10/06/2020   Procedure: POLYPECTOMY;  Surgeon: Mansouraty, Aloha Raddle., MD;  Location: [facility] ENDOSCOPY;  Service: Gastroenterology;;   TONSILLECTOMY     Patient Active  Problem List   Diagnosis Date Noted   Benign neoplasm of sigmoid colon 07/17/2024   Encounter for immunization 01/08/2024   Eczema of scalp 01/05/2023   Encounter for general adult medical examination with abnormal findings 01/03/2023   Somnolence 09/23/2022   Flu vaccine need 09/14/2022   Sensorineural hearing loss (SNHL) of left ear with unrestricted hearing of right ear 07/21/2022   Primary osteoarthritis of left knee 12/24/2021   Onychomycosis of left great toe 12/14/2021   Type II diabetes mellitus with manifestations (HCC) 12/14/2021   Balding 05/21/2020   Allergic rhinitis 04/12/2020   Acute URI 03/20/2019   Takotsubo cardiomyopathy 02/26/2019   NASH (nonalcoholic steatohepatitis) 01/22/2019   Chronic idiopathic constipation 01/12/2019   Arthritis of carpometacarpal (CMC) joint of left thumb 09/25/2018   Degenerative arthritis of left knee 05/24/2018   CAD (coronary artery disease) 05/16/2018   Vitamin D  deficiency 11/11/2017   Degenerative disc disease, lumbar 08/29/2017   Obesity (BMI 30.0-34.9) 06/14/2016   Osteopenia 07/20/2013   GERD (gastroesophageal reflux disease) 09/16/2011   Generalized anxiety disorder 09/16/2011   History of colonic polyps 08/17/2011   DJD (degenerative joint disease) of knee 06/25/2011   Pure hyperglyceridemia 06/24/2011   Hyperlipidemia LDL goal <70 07/30/2009   Hypothyroidism 04/25/2009  Essential hypertension 04/25/2009    PCP: [name], MD  REFERRING PROVIDER: Leonce Katz, DO   REFERRING DIAG:  F82.87 (ICD-10-CM) - Primary osteoarthritis of left knee  M25.562,G89.29 (ICD-10-CM) - Chronic pain of left knee    THERAPY DIAG:  Chronic pain of left knee  Muscle weakness (generalized)  Rationale for Evaluation and Treatment: Rehabilitation  ONSET DATE: Chronic, over a year  SUBJECTIVE:   SUBJECTIVE STATEMENT:  Overall  L knee pain is better. No pain over the weekend. Starting hurting early this morning. Took  Tylenol . Pain level now is 1/10.   EVAL: Pt reports chronic L knee which signifiicantly impacts her ability to walk and sleep at night. Shots have helped temporarily. Pt notes hse rides an ex bike 15-20 mins everyday.  PERTINENT HISTORY: See above. Hx MI, DM2  PAIN:  Are you having pain? Yes: NPRS scale: 1/10 Pain location: ant/medial L knee Pain description: throbbing, sharp Aggravating factors: standing, walking, sleeping Relieving factors: Rest sometimes helps  PRECAUTIONS: None  RED FLAGS: None   WEIGHT BEARING RESTRICTIONS: No  FALLS:  Has patient fallen in last 6 months? No  LIVING ENVIRONMENT: Lives with: lives alone Lives in: House/apartment Stairs: Yes: External: 3 steps; none Has following equipment at home: Single point cane  OCCUPATION: Retired  PLOF: Independent  PATIENT GOALS: Less pain, to get around better, go to church  NEXT MD VISIT: 08/21/24, Dr. Leonce  OBJECTIVE:  Note: Objective measures were completed at Evaluation unless otherwise noted.  DIAGNOSTIC FINDINGS:  L knee 12/23/23 IMPRESSION: Tricompartmental osteoarthritis, moderate in the medial compartment. Mild progression from prior exam.  PATIENT SURVEYS:  LEFS:16/80=20%  COGNITION: Overall cognitive status: Within functional limits for tasks assessed     SENSATION: WFL  EDEMA:  Swelling L knee   MUSCLE LENGTH: Hamstrings: Right NT deg; Left NT deg Thomas test: Right NT deg; Left NT deg  POSTURE: No Significant postural limitations  PALPATION: TTP ant/medial L knee joint space   LOWER EXTREMITY ROM:  Active ROM Right eval Left eval Left  08/06/24 Left 08/14/24  Hip flexion      Hip extension      Hip abduction      Hip adduction      Hip internal rotation      Hip external rotation      Knee flexion 135 90 112 117  Knee extension 0 15 lacking Lacks less than 5   Ankle dorsiflexion      Ankle plantarflexion      Ankle inversion      Ankle eversion       (Blank  rows = not tested)  LOWER EXTREMITY MMT:  MMT Right eval Left eval  Hip flexion 4 3  Hip extension    Hip abduction 4 3  Hip adduction    Hip internal rotation    Hip external rotation 4 3  Knee flexion 4+ 4  Knee extension 4+ 4  Ankle dorsiflexion    Ankle plantarflexion    Ankle inversion    Ankle eversion     (Blank rows = not tested)  FUNCTIONAL TESTS:  5 times sit to stand: 39 c use of hands  GAIT: Distance walked: 200' Assistive device utilized: None Level of assistance: Complete Independence Comments: Mod antalgic gait pattern over L LE at a decreased pace and flexed L knee  TREATMENT DATE:  [facility] Adult PT Treatment:                                                DATE: 08/14/24 Therapeutic Exercise: Seated heel slide 10 x 2 LAQ 2# x 15 Bridge x 10 Bridge with ball squeeze x 10  Supine heel slide AROM x15 QS x 10 into towel  SLR c QS 2 x 10 SAQ 3# x 15  Supine hamstring stretch with strap Manual Therapy: [email or web address] Above KT tape applied for anterior knee pain   [facility] Adult PT Treatment:                                                DATE: 08/09/24 Therapeutic Exercise: Seated heel slide  LAQ x 15 c ball squeeze Supine heel slide AA on towel/slide board  Long sit hamstring stretch with strap QS into towel x 10 SLR c QS 2 x 10, partial range due to weakness SAQ x 10  Bridge x10 c ball squeeze, partial range due to weakness Updated HEP Manual Therapy: [email or web address] Above KT tape applied for anterior knee pain Modalities: Estim: IFC to anterior knee 10 minutes 19mA  [facility] Adult PT Treatment:                                                DATE: 08/06/24 Therapeutic Exercise: Seated heel slide  LAQ x 10 QS into towel x 10 SLR 2 x 10 SAQ x 10  Supine heel slide AA on towel/slide  board  Supine hamstring stretch with strap Manual Therapy: [email or web address] Above KT tape applied for anterior knee pain Modalities: Estim: IFC to anterior knee 10 minutes 19mA  [facility] Adult PT Treatment:                                                DATE: 08/03/24 Therapeutic Exercise: Seated heel slides with towel Supine QS  Supine SLR  Supine heel slide AA on towel/slide board  Manual Therapy: [email or web address] Above KT tape applied for anterior knee pain Modalities: Estim: IFC to anterior knee 10 minutes 19mA  PATIENT EDUCATION:  Education details: Eval findings, POC, HEP, self care  Person educated: Patient Education method: Explanation, Demonstration, Tactile cues, Verbal cues, and Handouts Education comprehension: verbalized understanding, returned demonstration, verbal cues required, and tactile cues required  HOME EXERCISE PROGRAM: Access Code: [email or web address] URL: [email or web address] Date: 08/09/2024 Prepared by: Dasie Daft  Exercises - Supine Quad Set  - 3 x daily - 7 x weekly - 2 sets - 10 reps - 5 hold - Active Straight Leg Raise with Quad Set  - 3 x daily - 7 x weekly - 2 sets - 10 reps - 3 hold - Seated Long Arc Quad  - 1 x daily - 7 x weekly - 2 sets - 10 reps - 5 hold - Seated Table Hamstring Stretch  - 1 x daily - 7 x weekly - 1 sets - 3  reps - 30 hold - Supine Bridge  - 1 x daily - 7 x weekly - 2 sets - 10 reps - 3 hold  ASSESSMENT:  CLINICAL IMPRESSION: PT was continued for L LE/quad strengthening and ROM. AROM of the L knee continues to be improved. L quad weakness is  with the pt able to complete a full  SLR with the L LE. Overall, pt is reporting improvement in her L knee pain. Will plan to begin light closed chain next visit. Pt will continue to benefit from skilled PT to address impairments for improved L knee/LE function with minimized pain.   EVAL: Patient is a 73 y.o. female who was  seen today for physical therapy evaluation and treatment for  M17.12 (ICD-10-CM) - Primary osteoarthritis of left knee  M25.562,G89.29 (ICD-10-CM) - Chronic pain of left knee  Pt presents to PT with chronic L knee pain with decreased AROM and knee/hip strength, and pt walks with a mod antalgic gait pattern. Observation reveals arthritic changes and swelling. Pt was started on a HEP and instructed in self care. Pt may benefit from skilled PT 2w6 to address impairments to optimize L knee function with less pain.   OBJECTIVE IMPAIRMENTS: decreased activity tolerance, decreased balance, difficulty walking, decreased ROM, decreased strength, increased edema, and pain.   ACTIVITY LIMITATIONS: carrying, bending, squatting, stairs, transfers, and locomotion level  PARTICIPATION LIMITATIONS: meal prep, cleaning, laundry, shopping, community activity, and church  PERSONAL FACTORS: Fitness, Past/current experiences, Time since onset of injury/illness/exacerbation, and 1 comorbidity: DM2 are also affecting patient's functional outcome.   REHAB POTENTIAL: Fair chronicity of issue  CLINICAL DECISION MAKING: Evolving/moderate complexity  EVALUATION COMPLEXITY: Moderate   GOALS:  SHORT TERM GOALS = LTGs  LONG TERM GOALS: Target date: 09/14/24  Pt will be Ind in a final HEP to maintain achieved LOF Baseline: started Goal status: ONGOING  2.  Pt will report 50% or greater improvement in her L knee pain for improved function and QOL Baseline:  Goal status: INITIAL  3.  Improve 5xSTS by 10 as indication of improved functional mobility  Baseline: 39 with use of hands Goal status: INITIAL  4.  Pt will demonstrate an improved quality of gait walking with only a minimal to no antalgic gait pattern as indication of improved pain  Baseline: mod antalgic gait pattern Goal status: INITIAL  5.  Pt's LEFS score will improve by the MCID to 35% as indication of improved function  Baseline: 20% Goal  status: INITIAL   PLAN:  PT FREQUENCY: 2x/week  PT DURATION: 6 weeks  PLANNED INTERVENTIONS: 97164- PT Re-evaluation, 97110-Therapeutic exercises, 97530- Therapeutic activity, 97112- Neuromuscular re-education, 97535- Self Care, 02859- Manual therapy, [id number]- Gait training, [phone]- Aquatic Therapy, [phone]- Electrical stimulation (unattended), [phone]- Ionotophoresis 4mg /ml Dexamethasone, 79439 (1-2 muscles), 20561 (3+ muscles)- Dry Needling, Patient/Family education, Balance training, Stair training, Taping, Joint mobilization, Cryotherapy, and Moist heat  PLAN FOR NEXT SESSION: Assess response to HEP; progress therex as indicated; use of modalities, manual therapy; and TPDN as indicated. KT tape PRN   Harlene Persons, PTA 08/14/24 8:44 AM Phone: [phone] Fax: [phone]

## 2024-08-16 ENCOUNTER — Ambulatory Visit: Admitting: Physical Therapy

## 2024-08-16 ENCOUNTER — Encounter: Payer: Self-pay | Admitting: Physical Therapy

## 2024-08-16 DIAGNOSIS — R262 Difficulty in walking, not elsewhere classified: Secondary | ICD-10-CM

## 2024-08-16 DIAGNOSIS — M25562 Pain in left knee: Secondary | ICD-10-CM | POA: Diagnosis not present

## 2024-08-16 DIAGNOSIS — M6281 Muscle weakness (generalized): Secondary | ICD-10-CM

## 2024-08-16 DIAGNOSIS — G8929 Other chronic pain: Secondary | ICD-10-CM

## 2024-08-16 DIAGNOSIS — M79662 Pain in left lower leg: Secondary | ICD-10-CM

## 2024-08-16 NOTE — Therapy (Signed)
 OUTPATIENT PHYSICAL THERAPY LOWER EXTREMITY TREATMENT   Patient Name: [name] MRN: [id number] DOB:[birth year], 73 y.o., female Today's Date: 08/16/2024  END OF SESSION:  PT End of Session - 08/16/24 0805     Visit Number 6    Number of Visits 13    Date for PT Re-Evaluation 09/14/24    Authorization Type [employer]    PT Start Time 0804    PT Stop Time 0842    PT Time Calculation (min) 38 min           Past Medical History:  Diagnosis Date   Anemia    Arthritis    Cardiomyopathy (HCC)    Cholelithiasis    Chronic back pain    Constipation    Esophageal reflux    Fibromyalgia    Hemorrhoids    Hyperlipidemia    Hypertension    Hypothyroid    IBS (irritable bowel syndrome)    Internal hemorrhoids without mention of complication    Left leg pain    Myocardial infarction [facility])    Personal history of colonic polyps 05/06/2010   ADENOMATOUS POLYP   Pneumonia    Tubular adenoma of colon    Past Surgical History:  Procedure Laterality Date   ABDOMINAL HYSTERECTOMY     CERVICAL DISC SURGERY     PLATE IN NECK & BACK   CHOLECYSTECTOMY     COLONOSCOPY  09/27/2011   NORMAL    COLONOSCOPY N/A 07/17/2024   Procedure: COLONOSCOPY;  Surgeon: Albertus Gordy HERO, MD;  Location: [facility] ENDOSCOPY;  Service: Gastroenterology;  Laterality: N/A;   COLONOSCOPY WITH PROPOFOL  N/A 10/06/2020   Procedure: COLONOSCOPY WITH PROPOFOL ;  Surgeon: Wilhelmenia Aloha Raddle., MD;  Location: [facility] ENDOSCOPY;  Service: Gastroenterology;  Laterality: N/A;  ultra slim scope    LEFT HEART CATH AND CORONARY ANGIOGRAPHY N/A 05/08/2018   Procedure: LEFT HEART CATH AND CORONARY ANGIOGRAPHY;  Surgeon: Claudene Victory ORN, MD;  Location: [facility] INVASIVE CV LAB;  Service: Cardiovascular;  Laterality: N/A;   LUMBAR DISC SURGERY     POLYPECTOMY  10/06/2020   Procedure: POLYPECTOMY;  Surgeon: Mansouraty, Aloha Raddle., MD;  Location: [facility] ENDOSCOPY;  Service: Gastroenterology;;   TONSILLECTOMY     Patient Active  Problem List   Diagnosis Date Noted   Benign neoplasm of sigmoid colon 07/17/2024   Encounter for immunization 01/08/2024   Eczema of scalp 01/05/2023   Encounter for general adult medical examination with abnormal findings 01/03/2023   Somnolence 09/23/2022   Flu vaccine need 09/14/2022   Sensorineural hearing loss (SNHL) of left ear with unrestricted hearing of right ear 07/21/2022   Primary osteoarthritis of left knee 12/24/2021   Onychomycosis of left great toe 12/14/2021   Type II diabetes mellitus with manifestations (HCC) 12/14/2021   Balding 05/21/2020   Allergic rhinitis 04/12/2020   Acute URI 03/20/2019   Takotsubo cardiomyopathy 02/26/2019   NASH (nonalcoholic steatohepatitis) 01/22/2019   Chronic idiopathic constipation 01/12/2019   Arthritis of carpometacarpal (CMC) joint of left thumb 09/25/2018   Degenerative arthritis of left knee 05/24/2018   CAD (coronary artery disease) 05/16/2018   Vitamin D  deficiency 11/11/2017   Degenerative disc disease, lumbar 08/29/2017   Obesity (BMI 30.0-34.9) 06/14/2016   Osteopenia 07/20/2013   GERD (gastroesophageal reflux disease) 09/16/2011   Generalized anxiety disorder 09/16/2011   History of colonic polyps 08/17/2011   DJD (degenerative joint disease) of knee 06/25/2011   Pure hyperglyceridemia 06/24/2011   Hyperlipidemia LDL goal <70 07/30/2009   Hypothyroidism 04/25/2009  Essential hypertension 04/25/2009    PCP: [name], MD  REFERRING PROVIDER: Leonce Katz, DO   REFERRING DIAG:  F82.87 (ICD-10-CM) - Primary osteoarthritis of left knee  M25.562,G89.29 (ICD-10-CM) - Chronic pain of left knee    THERAPY DIAG:  Chronic pain of left knee  Muscle weakness (generalized)  Difficulty in walking, not elsewhere classified  Pain in left lower leg  Rationale for Evaluation and Treatment: Rehabilitation  ONSET DATE: Chronic, over a year  SUBJECTIVE:   SUBJECTIVE STATEMENT:  Overall  L knee pain is  better. No pain over the weekend. Starting hurting early this morning. Took Tylenol . Pain level now is 1/10.   EVAL: Pt reports chronic L knee which signifiicantly impacts her ability to walk and sleep at night. Shots have helped temporarily. Pt notes hse rides an ex bike 15-20 mins everyday.  PERTINENT HISTORY: See above. Hx MI, DM2  PAIN:  Are you having pain? Yes: NPRS scale: 1/10 Pain location: ant/medial L knee Pain description: throbbing, sharp Aggravating factors: standing, walking, sleeping Relieving factors: Rest sometimes helps  PRECAUTIONS: None  RED FLAGS: None   WEIGHT BEARING RESTRICTIONS: No  FALLS:  Has patient fallen in last 6 months? No  LIVING ENVIRONMENT: Lives with: lives alone Lives in: House/apartment Stairs: Yes: External: 3 steps; none Has following equipment at home: Single point cane  OCCUPATION: Retired  PLOF: Independent  PATIENT GOALS: Less pain, to get around better, go to church  NEXT MD VISIT: 08/21/24, Dr. Leonce  OBJECTIVE:  Note: Objective measures were completed at Evaluation unless otherwise noted.  DIAGNOSTIC FINDINGS:  L knee 12/23/23 IMPRESSION: Tricompartmental osteoarthritis, moderate in the medial compartment. Mild progression from prior exam.  PATIENT SURVEYS:  LEFS:16/80=20%  COGNITION: Overall cognitive status: Within functional limits for tasks assessed     SENSATION: WFL  EDEMA:  Swelling L knee   MUSCLE LENGTH: Hamstrings: Right NT deg; Left NT deg Thomas test: Right NT deg; Left NT deg  POSTURE: No Significant postural limitations  PALPATION: TTP ant/medial L knee joint space   LOWER EXTREMITY ROM:  Active ROM Right eval Left eval Left  08/06/24 Left 08/14/24 Left  08/16/24  Hip flexion       Hip extension       Hip abduction       Hip adduction       Hip internal rotation       Hip external rotation       Knee flexion 135 90 112 117 120  Knee extension 0 15 lacking Lacks less than 5     Ankle dorsiflexion       Ankle plantarflexion       Ankle inversion       Ankle eversion        (Blank rows = not tested)  LOWER EXTREMITY MMT:  MMT Right eval Left eval  Hip flexion 4 3  Hip extension    Hip abduction 4 3  Hip adduction    Hip internal rotation    Hip external rotation 4 3  Knee flexion 4+ 4  Knee extension 4+ 4  Ankle dorsiflexion    Ankle plantarflexion    Ankle inversion    Ankle eversion     (Blank rows = not tested)  FUNCTIONAL TESTS:  5 times sit to stand: 39 c use of hands 07/30/24: 26.4 without UE   GAIT: Distance walked: 200' Assistive device utilized: None Level of assistance: Complete Independence Comments: Mod antalgic gait pattern over L  LE at a decreased pace and flexed L knee                                                                                                                  TREATMENT DATE:  [facility] Adult PT Treatment:                                                DATE: 08/16/24 Therapeutic Exercise: Seated H/S curl red band 10 x 2  LAQ 3# x 12 Bridge with ball squeeze x 10  Hip flexor stretch left EOM with knee flex 10 sec x 3  QS x 10 into towel  SLR c QS 2 x 10 SAQ 3# 10 x 2  Supine hamstring stretch with strap STS 5 x 2   Bilat heel raise  Tandem stance 15 sec x 3 each  Rec Bike L1 x 5 minutes  Manual Therapy: [email or web address] Above KT tape applied for anterior knee pain    [facility] Adult PT Treatment:                                                DATE: 08/14/24 Therapeutic Exercise: Seated heel slide 10 x 2 LAQ 2# x 15 Bridge x 10 Bridge with ball squeeze x 10  Supine heel slide AROM x15 QS x 10 into towel  SLR c QS 2 x 10 SAQ 3# x 15  Supine hamstring stretch with strap Manual Therapy: [email or web address] Above KT tape applied for anterior knee pain   [facility] Adult PT Treatment:                                                DATE: 08/09/24 Therapeutic  Exercise: Seated heel slide  LAQ x 15 c ball squeeze Supine heel slide AA on towel/slide board  Long sit hamstring stretch with strap QS into towel x 10 SLR c QS 2 x 10, partial range due to weakness SAQ x 10  Bridge x10 c ball squeeze, partial range due to weakness Updated HEP Manual Therapy: [email or web address] Above KT tape applied for anterior knee pain Modalities: Estim: IFC to anterior knee 10 minutes 19mA  [facility] Adult PT Treatment:                                                DATE: 08/06/24 Therapeutic Exercise: Seated heel slide  LAQ x 10 QS into towel x 10 SLR 2 x 10 SAQ x 10  Supine heel  slide AA on towel/slide board  Supine hamstring stretch with strap Manual Therapy: [email or web address] Above KT tape applied for anterior knee pain Modalities: Estim: IFC to anterior knee 10 minutes 19mA    PATIENT EDUCATION:  Education details: Eval findings, POC, HEP, self care  Person educated: Patient Education method: Explanation, Demonstration, Tactile cues, Verbal cues, and Handouts Education comprehension: verbalized understanding, returned demonstration, verbal cues required, and tactile cues required  HOME EXERCISE PROGRAM: Access Code: [email or web address] URL: [email or web address] Date: 08/09/2024 Prepared by: Dasie Daft  Exercises - Supine Quad Set  - 3 x daily - 7 x weekly - 2 sets - 10 reps - 5 hold - Active Straight Leg Raise with Quad Set  - 3 x daily - 7 x weekly - 2 sets - 10 reps - 3 hold - Seated Long Arc Quad  - 1 x daily - 7 x weekly - 2 sets - 10 reps - 5 hold - Seated Table Hamstring Stretch  - 1 x daily - 7 x weekly - 1 sets - 3 reps - 30 hold - Supine Bridge  - 1 x daily - 7 x weekly - 2 sets - 10 reps - 3 hold - Standing Tandem Balance with Counter Support  - 1 x daily - 7 x weekly - 3 sets - 20-30 hold - Sit to stand with control  - 1 x daily - 7 x weekly - 2 sets - 5-10  reps  ASSESSMENT:  CLINICAL IMPRESSION: PT was continued for L LE/quad strengthening and ROM. AROM of the L knee continues to be improved. Gait is improved. L quad weakness is  improved with the pt able to complete a full  SLR with the L LE, multiple reps. Overall, pt is reporting improvement in her L knee pain. Introduced closed chain strengthening with STS. Pt 5 x STS has improved by 15 sec without use of UE. LTG MET. Introduced balance with tandem stance. Updated HEP.  Pt will continue to benefit from skilled PT to address impairments for improved L knee/LE function with minimized pain.   EVAL: Patient is a 73 y.o. female who was seen today for physical therapy evaluation and treatment for  M17.12 (ICD-10-CM) - Primary osteoarthritis of left knee  M25.562,G89.29 (ICD-10-CM) - Chronic pain of left knee  Pt presents to PT with chronic L knee pain with decreased AROM and knee/hip strength, and pt walks with a mod antalgic gait pattern. Observation reveals arthritic changes and swelling. Pt was started on a HEP and instructed in self care. Pt may benefit from skilled PT 2w6 to address impairments to optimize L knee function with less pain.   OBJECTIVE IMPAIRMENTS: decreased activity tolerance, decreased balance, difficulty walking, decreased ROM, decreased strength, increased edema, and pain.   ACTIVITY LIMITATIONS: carrying, bending, squatting, stairs, transfers, and locomotion level  PARTICIPATION LIMITATIONS: meal prep, cleaning, laundry, shopping, community activity, and church  PERSONAL FACTORS: Fitness, Past/current experiences, Time since onset of injury/illness/exacerbation, and 1 comorbidity: DM2 are also affecting patient's functional outcome.   REHAB POTENTIAL: Fair chronicity of issue  CLINICAL DECISION MAKING: Evolving/moderate complexity  EVALUATION COMPLEXITY: Moderate   GOALS:  SHORT TERM GOALS = LTGs  LONG TERM GOALS: Target date: 09/14/24  Pt will be Ind in a final  HEP to maintain achieved LOF Baseline: started Goal status: ONGOING  2.  Pt will report 50% or greater improvement in her L knee pain for improved function and QOL Baseline:  Goal status: INITIAL  3.  Improve 5xSTS by  10 as indication of improved functional mobility  Baseline: 39 with use of hands 07/30/24: 26.4 sec Goal status: MET   4.  Pt will demonstrate an improved quality of gait walking with only a minimal to no antalgic gait pattern as indication of improved pain  Baseline: mod antalgic gait pattern Goal status: INITIAL  5.  Pt's LEFS score will improve by the MCID to 35% as indication of improved function  Baseline: 20% Goal status: INITIAL   PLAN:  PT FREQUENCY: 2x/week  PT DURATION: 6 weeks  PLANNED INTERVENTIONS: 97164- PT Re-evaluation, 97110-Therapeutic exercises, 97530- Therapeutic activity, 97112- Neuromuscular re-education, 97535- Self Care, 02859- Manual therapy, [id number]- Gait training, [phone]- Aquatic Therapy, [phone]- Electrical stimulation (unattended), [phone]- Ionotophoresis 4mg /ml Dexamethasone, 79439 (1-2 muscles), 20561 (3+ muscles)- Dry Needling, Patient/Family education, Balance training, Stair training, Taping, Joint mobilization, Cryotherapy, and Moist heat  PLAN FOR NEXT SESSION: Assess response to HEP; progress therex as indicated; use of modalities, manual therapy; and TPDN as indicated. KT tape PRN   Harlene Persons, PTA 08/16/24 8:38 AM Phone: [phone] Fax: [phone]
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
## 2024-08-21 ENCOUNTER — Ambulatory Visit (INDEPENDENT_AMBULATORY_CARE_PROVIDER_SITE_OTHER): Admitting: Sports Medicine

## 2024-08-21 ENCOUNTER — Encounter: Payer: Self-pay | Admitting: Physical Therapy

## 2024-08-21 ENCOUNTER — Ambulatory Visit: Admitting: Physical Therapy

## 2024-08-21 VITALS — HR 78 | Ht 66.0 in | Wt 176.0 lb

## 2024-08-21 DIAGNOSIS — M25562 Pain in left knee: Secondary | ICD-10-CM

## 2024-08-21 DIAGNOSIS — G8929 Other chronic pain: Secondary | ICD-10-CM

## 2024-08-21 DIAGNOSIS — M1712 Unilateral primary osteoarthritis, left knee: Secondary | ICD-10-CM

## 2024-08-21 MED ORDER — TRIAMCINOLONE ACETONIDE 32 MG IX SRER
32.0000 mg | Freq: Once | INTRA_ARTICULAR | Status: AC
Start: 2024-08-21 — End: 2024-08-21
  Administered 2024-08-21: 32 mg via INTRA_ARTICULAR

## 2024-08-21 NOTE — Patient Instructions (Signed)
 Tylenol  650 mg 3x a day   Continue HEP and PT   6 week follow up

## 2024-08-21 NOTE — Therapy (Signed)
 OUTPATIENT PHYSICAL THERAPY LOWER EXTREMITY TREATMENT     08/21/24: Patient arrives to appointment after MD visit where she received a cortisone injection in her left knee. Treatment for today is deferred. Will resume on 08/23/24 as scheduled. Patient is in agreement.  No charge for today's visit.    Patient Name: [name] MRN: [id number] DOB:[birth year], 73 y.o., female Today's Date: 08/21/2024  END OF SESSION:  PT End of Session - 08/21/24 0927     Visit Number --   arrived no charge   Number of Visits --    Date for Recertification  --    Authorization Type --    PT Start Time --           Past Medical History:  Diagnosis Date   Anemia    Arthritis    Cardiomyopathy (HCC)    Cholelithiasis    Chronic back pain    Constipation    Esophageal reflux    Fibromyalgia    Hemorrhoids    Hyperlipidemia    Hypertension    Hypothyroid    IBS (irritable bowel syndrome)    Internal hemorrhoids without mention of complication    Left leg pain    Myocardial infarction [facility])    Personal history of colonic polyps 05/06/2010   ADENOMATOUS POLYP   Pneumonia    Tubular adenoma of colon    Past Surgical History:  Procedure Laterality Date   ABDOMINAL HYSTERECTOMY     CERVICAL DISC SURGERY     PLATE IN NECK & BACK   CHOLECYSTECTOMY     COLONOSCOPY  09/27/2011   NORMAL    COLONOSCOPY N/A 07/17/2024   Procedure: COLONOSCOPY;  Surgeon: Albertus Gordy HERO, MD;  Location: [facility] ENDOSCOPY;  Service: Gastroenterology;  Laterality: N/A;   COLONOSCOPY WITH PROPOFOL  N/A 10/06/2020   Procedure: COLONOSCOPY WITH PROPOFOL ;  Surgeon: Mansouraty, Aloha Raddle., MD;  Location: [facility] ENDOSCOPY;  Service: Gastroenterology;  Laterality: N/A;  ultra slim scope    LEFT HEART CATH AND CORONARY ANGIOGRAPHY N/A 05/08/2018   Procedure: LEFT HEART CATH AND CORONARY ANGIOGRAPHY;  Surgeon: Claudene Victory ORN, MD;  Location: [facility] INVASIVE CV LAB;  Service: Cardiovascular;  Laterality: N/A;   LUMBAR DISC SURGERY      POLYPECTOMY  10/06/2020   Procedure: POLYPECTOMY;  Surgeon: Mansouraty, Aloha Raddle., MD;  Location: [facility] ENDOSCOPY;  Service: Gastroenterology;;   TONSILLECTOMY     Patient Active Problem List   Diagnosis Date Noted   Benign neoplasm of sigmoid colon 07/17/2024   Encounter for immunization 01/08/2024   Eczema of scalp 01/05/2023   Encounter for general adult medical examination with abnormal findings 01/03/2023   Somnolence 09/23/2022   Flu vaccine need 09/14/2022   Sensorineural hearing loss (SNHL) of left ear with unrestricted hearing of right ear 07/21/2022   Primary osteoarthritis of left knee 12/24/2021   Onychomycosis of left great toe 12/14/2021   Type II diabetes mellitus with manifestations (HCC) 12/14/2021   Balding 05/21/2020   Allergic rhinitis 04/12/2020   Acute URI 03/20/2019   Takotsubo cardiomyopathy 02/26/2019   NASH (nonalcoholic steatohepatitis) 01/22/2019   Chronic idiopathic constipation 01/12/2019   Arthritis of carpometacarpal (CMC) joint of left thumb 09/25/2018   Degenerative arthritis of left knee 05/24/2018   CAD (coronary artery disease) 05/16/2018   Vitamin D  deficiency 11/11/2017   Degenerative disc disease, lumbar 08/29/2017   Obesity (BMI 30.0-34.9) 06/14/2016   Osteopenia 07/20/2013   GERD (gastroesophageal reflux disease) 09/16/2011   Generalized anxiety disorder 09/16/2011  History of colonic polyps 08/17/2011   DJD (degenerative joint disease) of knee 06/25/2011   Pure hyperglyceridemia 06/24/2011   Hyperlipidemia LDL goal <70 07/30/2009   Hypothyroidism 04/25/2009   Essential hypertension 04/25/2009    PCP: [name], MD  REFERRING PROVIDER: Leonce Katz, DO   REFERRING DIAG:  [phone] (ICD-10-CM) - Primary osteoarthritis of left knee  M25.562,G89.29 (ICD-10-CM) - Chronic pain of left knee    THERAPY DIAG:  No diagnosis found.  Rationale for Evaluation and Treatment: Rehabilitation  ONSET DATE: Chronic, over a  year  SUBJECTIVE:   SUBJECTIVE STATEMENT:  Overall  L knee pain is better. No pain over the weekend. Starting hurting early this morning. Took Tylenol . Pain level now is 1/10.   EVAL: Pt reports chronic L knee which signifiicantly impacts her ability to walk and sleep at night. Shots have helped temporarily. Pt notes hse rides an ex bike 15-20 mins everyday.  PERTINENT HISTORY: See above. Hx MI, DM2  PAIN:  Are you having pain? Yes: NPRS scale: 1/10 Pain location: ant/medial L knee Pain description: throbbing, sharp Aggravating factors: standing, walking, sleeping Relieving factors: Rest sometimes helps  PRECAUTIONS: None  RED FLAGS: None   WEIGHT BEARING RESTRICTIONS: No  FALLS:  Has patient fallen in last 6 months? No  LIVING ENVIRONMENT: Lives with: lives alone Lives in: House/apartment Stairs: Yes: External: 3 steps; none Has following equipment at home: Single point cane  OCCUPATION: Retired  PLOF: Independent  PATIENT GOALS: Less pain, to get around better, go to church  NEXT MD VISIT: 08/21/24, Dr. Leonce  OBJECTIVE:  Note: Objective measures were completed at Evaluation unless otherwise noted.  DIAGNOSTIC FINDINGS:  L knee 12/23/23 IMPRESSION: Tricompartmental osteoarthritis, moderate in the medial compartment. Mild progression from prior exam.  PATIENT SURVEYS:  LEFS:16/80=20%  COGNITION: Overall cognitive status: Within functional limits for tasks assessed     SENSATION: WFL  EDEMA:  Swelling L knee   MUSCLE LENGTH: Hamstrings: Right NT deg; Left NT deg Thomas test: Right NT deg; Left NT deg  POSTURE: No Significant postural limitations  PALPATION: TTP ant/medial L knee joint space   LOWER EXTREMITY ROM:  Active ROM Right eval Left eval Left  08/06/24 Left 08/14/24 Left  08/16/24  Hip flexion       Hip extension       Hip abduction       Hip adduction       Hip internal rotation       Hip external rotation       Knee flexion  135 90 112 117 120  Knee extension 0 15 lacking Lacks less than 5    Ankle dorsiflexion       Ankle plantarflexion       Ankle inversion       Ankle eversion        (Blank rows = not tested)  LOWER EXTREMITY MMT:  MMT Right eval Left eval  Hip flexion 4 3  Hip extension    Hip abduction 4 3  Hip adduction    Hip internal rotation    Hip external rotation 4 3  Knee flexion 4+ 4  Knee extension 4+ 4  Ankle dorsiflexion    Ankle plantarflexion    Ankle inversion    Ankle eversion     (Blank rows = not tested)  FUNCTIONAL TESTS:  5 times sit to stand: 39 c use of hands 07/30/24: 26.4 without UE   GAIT: Distance walked: 200' Assistive device utilized:  None Level of assistance: Complete Independence Comments: Mod antalgic gait pattern over L LE at a decreased pace and flexed L knee                                                                                                                  TREATMENT DATE:  [facility] Adult PT Treatment:                                                DATE: 08/16/24 Therapeutic Exercise: Seated H/S curl red band 10 x 2  LAQ 3# x 12 Bridge with ball squeeze x 10  Hip flexor stretch left EOM with knee flex 10 sec x 3  QS x 10 into towel  SLR c QS 2 x 10 SAQ 3# 10 x 2  Supine hamstring stretch with strap STS 5 x 2   Bilat heel raise  Tandem stance 15 sec x 3 each  Rec Bike L1 x 5 minutes  Manual Therapy: [email or web address] Above KT tape applied for anterior knee pain    [facility] Adult PT Treatment:                                                DATE: 08/14/24 Therapeutic Exercise: Seated heel slide 10 x 2 LAQ 2# x 15 Bridge x 10 Bridge with ball squeeze x 10  Supine heel slide AROM x15 QS x 10 into towel  SLR c QS 2 x 10 SAQ 3# x 15  Supine hamstring stretch with strap Manual Therapy: [email or web address] Above KT tape applied for anterior knee pain   [facility] Adult PT Treatment:                                                 DATE: 08/09/24 Therapeutic Exercise: Seated heel slide  LAQ x 15 c ball squeeze Supine heel slide AA on towel/slide board  Long sit hamstring stretch with strap QS into towel x 10 SLR c QS 2 x 10, partial range due to weakness SAQ x 10  Bridge x10 c ball squeeze, partial range due to weakness Updated HEP Manual Therapy: [email or web address] Above KT tape applied for anterior knee pain Modalities: Estim: IFC to anterior knee 10 minutes 19mA  [facility] Adult PT Treatment:                                                DATE: 08/06/24 Therapeutic Exercise: Seated heel slide  LAQ x 10 QS into  towel x 10 SLR 2 x 10 SAQ x 10  Supine heel slide AA on towel/slide board  Supine hamstring stretch with strap Manual Therapy: [email or web address] Above KT tape applied for anterior knee pain Modalities: Estim: IFC to anterior knee 10 minutes 19mA    PATIENT EDUCATION:  Education details: Eval findings, POC, HEP, self care  Person educated: Patient Education method: Explanation, Demonstration, Tactile cues, Verbal cues, and Handouts Education comprehension: verbalized understanding, returned demonstration, verbal cues required, and tactile cues required  HOME EXERCISE PROGRAM: Access Code: [email or web address] URL: [email or web address] Date: 08/09/2024 Prepared by: Dasie Daft  Exercises - Supine Quad Set  - 3 x daily - 7 x weekly - 2 sets - 10 reps - 5 hold - Active Straight Leg Raise with Quad Set  - 3 x daily - 7 x weekly - 2 sets - 10 reps - 3 hold - Seated Long Arc Quad  - 1 x daily - 7 x weekly - 2 sets - 10 reps - 5 hold - Seated Table Hamstring Stretch  - 1 x daily - 7 x weekly - 1 sets - 3 reps - 30 hold - Supine Bridge  - 1 x daily - 7 x weekly - 2 sets - 10 reps - 3 hold - Standing Tandem Balance with Counter Support  - 1 x daily - 7 x weekly - 3 sets - 20-30 hold - Sit to stand with  control  - 1 x daily - 7 x weekly - 2 sets - 5-10 reps  ASSESSMENT:  CLINICAL IMPRESSION: PT was continued for L LE/quad strengthening and ROM. AROM of the L knee continues to be improved. Gait is improved. L quad weakness is  improved with the pt able to complete a full  SLR with the L LE, multiple reps. Overall, pt is reporting improvement in her L knee pain. Introduced closed chain strengthening with STS. Pt 5 x STS has improved by 15 sec without use of UE. LTG MET. Introduced balance with tandem stance. Updated HEP.  Pt will continue to benefit from skilled PT to address impairments for improved L knee/LE function with minimized pain.   EVAL: Patient is a 73 y.o. female who was seen today for physical therapy evaluation and treatment for  M17.12 (ICD-10-CM) - Primary osteoarthritis of left knee  M25.562,G89.29 (ICD-10-CM) - Chronic pain of left knee  Pt presents to PT with chronic L knee pain with decreased AROM and knee/hip strength, and pt walks with a mod antalgic gait pattern. Observation reveals arthritic changes and swelling. Pt was started on a HEP and instructed in self care. Pt may benefit from skilled PT 2w6 to address impairments to optimize L knee function with less pain.   OBJECTIVE IMPAIRMENTS: decreased activity tolerance, decreased balance, difficulty walking, decreased ROM, decreased strength, increased edema, and pain.   ACTIVITY LIMITATIONS: carrying, bending, squatting, stairs, transfers, and locomotion level  PARTICIPATION LIMITATIONS: meal prep, cleaning, laundry, shopping, community activity, and church  PERSONAL FACTORS: Fitness, Past/current experiences, Time since onset of injury/illness/exacerbation, and 1 comorbidity: DM2 are also affecting patient's functional outcome.   REHAB POTENTIAL: Fair chronicity of issue  CLINICAL DECISION MAKING: Evolving/moderate complexity  EVALUATION COMPLEXITY: Moderate   GOALS:  SHORT TERM GOALS = LTGs  LONG TERM GOALS:  Target date: 09/14/24  Pt will be Ind in a final HEP to maintain achieved LOF Baseline: started Goal status: ONGOING  2.  Pt will report 50% or greater improvement in her L knee pain for improved function  and QOL Baseline:  Goal status: INITIAL  3.  Improve 5xSTS by 10 as indication of improved functional mobility  Baseline: 39 with use of hands 07/30/24: 26.4 sec Goal status: MET   4.  Pt will demonstrate an improved quality of gait walking with only a minimal to no antalgic gait pattern as indication of improved pain  Baseline: mod antalgic gait pattern Goal status: INITIAL  5.  Pt's LEFS score will improve by the MCID to 35% as indication of improved function  Baseline: 20% Goal status: INITIAL   PLAN:  PT FREQUENCY: 2x/week  PT DURATION: 6 weeks  PLANNED INTERVENTIONS: 97164- PT Re-evaluation, 97110-Therapeutic exercises, 97530- Therapeutic activity, 97112- Neuromuscular re-education, 97535- Self Care, [id number]- Manual therapy, [id number]- Gait training, [phone]- Aquatic Therapy, [phone]- Electrical stimulation (unattended), [phone]- Ionotophoresis 4mg /ml Dexamethasone, 79439 (1-2 muscles), 20561 (3+ muscles)- Dry Needling, Patient/Family education, Balance training, Stair training, Taping, Joint mobilization, Cryotherapy, and Moist heat  PLAN FOR NEXT SESSION: Assess response to HEP; progress therex as indicated; use of modalities, manual therapy; and TPDN as indicated. KT tape PRN   Harlene Persons, PTA 08/21/24 9:47 AM Phone: [phone] Fax: [phone]

## 2024-08-23 ENCOUNTER — Encounter: Payer: Self-pay | Admitting: Physical Therapy

## 2024-08-23 ENCOUNTER — Ambulatory Visit: Admitting: Physical Therapy

## 2024-08-23 DIAGNOSIS — G8929 Other chronic pain: Secondary | ICD-10-CM

## 2024-08-23 DIAGNOSIS — M25562 Pain in left knee: Secondary | ICD-10-CM | POA: Diagnosis not present

## 2024-08-23 DIAGNOSIS — M6281 Muscle weakness (generalized): Secondary | ICD-10-CM

## 2024-08-23 NOTE — Therapy (Signed)
 OUTPATIENT PHYSICAL THERAPY LOWER EXTREMITY TREATMENT   Patient Name: [name] MRN: [id number] DOB:[birth year], 73 y.o., female Today's Date: 08/23/2024  END OF SESSION:  PT End of Session - 08/23/24 0804     Visit Number 7    Number of Visits 13    Date for Recertification  09/14/24    Authorization Type [employer]    PT Start Time 0802    PT Stop Time 0840    PT Time Calculation (min) 38 min           Past Medical History:  Diagnosis Date   Anemia    Arthritis    Cardiomyopathy (HCC)    Cholelithiasis    Chronic back pain    Constipation    Esophageal reflux    Fibromyalgia    Hemorrhoids    Hyperlipidemia    Hypertension    Hypothyroid    IBS (irritable bowel syndrome)    Internal hemorrhoids without mention of complication    Left leg pain    Myocardial infarction [facility])    Personal history of colonic polyps 05/06/2010   ADENOMATOUS POLYP   Pneumonia    Tubular adenoma of colon    Past Surgical History:  Procedure Laterality Date   ABDOMINAL HYSTERECTOMY     CERVICAL DISC SURGERY     PLATE IN NECK & BACK   CHOLECYSTECTOMY     COLONOSCOPY  09/27/2011   NORMAL    COLONOSCOPY N/A 07/17/2024   Procedure: COLONOSCOPY;  Surgeon: Albertus Gordy HERO, MD;  Location: [facility] ENDOSCOPY;  Service: Gastroenterology;  Laterality: N/A;   COLONOSCOPY WITH PROPOFOL  N/A 10/06/2020   Procedure: COLONOSCOPY WITH PROPOFOL ;  Surgeon: Wilhelmenia Aloha Raddle., MD;  Location: [facility] ENDOSCOPY;  Service: Gastroenterology;  Laterality: N/A;  ultra slim scope    LEFT HEART CATH AND CORONARY ANGIOGRAPHY N/A 05/08/2018   Procedure: LEFT HEART CATH AND CORONARY ANGIOGRAPHY;  Surgeon: Claudene Victory ORN, MD;  Location: [facility] INVASIVE CV LAB;  Service: Cardiovascular;  Laterality: N/A;   LUMBAR DISC SURGERY     POLYPECTOMY  10/06/2020   Procedure: POLYPECTOMY;  Surgeon: Mansouraty, Aloha Raddle., MD;  Location: [facility] ENDOSCOPY;  Service: Gastroenterology;;   TONSILLECTOMY     Patient Active  Problem List   Diagnosis Date Noted   Benign neoplasm of sigmoid colon 07/17/2024   Encounter for immunization 01/08/2024   Eczema of scalp 01/05/2023   Encounter for general adult medical examination with abnormal findings 01/03/2023   Somnolence 09/23/2022   Flu vaccine need 09/14/2022   Sensorineural hearing loss (SNHL) of left ear with unrestricted hearing of right ear 07/21/2022   Primary osteoarthritis of left knee 12/24/2021   Onychomycosis of left great toe 12/14/2021   Type II diabetes mellitus with manifestations (HCC) 12/14/2021   Balding 05/21/2020   Allergic rhinitis 04/12/2020   Acute URI 03/20/2019   Takotsubo cardiomyopathy 02/26/2019   NASH (nonalcoholic steatohepatitis) 01/22/2019   Chronic idiopathic constipation 01/12/2019   Arthritis of carpometacarpal (CMC) joint of left thumb 09/25/2018   Degenerative arthritis of left knee 05/24/2018   CAD (coronary artery disease) 05/16/2018   Vitamin D  deficiency 11/11/2017   Degenerative disc disease, lumbar 08/29/2017   Obesity (BMI 30.0-34.9) 06/14/2016   Osteopenia 07/20/2013   GERD (gastroesophageal reflux disease) 09/16/2011   Generalized anxiety disorder 09/16/2011   History of colonic polyps 08/17/2011   DJD (degenerative joint disease) of knee 06/25/2011   Pure hyperglyceridemia 06/24/2011   Hyperlipidemia LDL goal <70 07/30/2009   Hypothyroidism 04/25/2009  Essential hypertension 04/25/2009    PCP: [name], MD  REFERRING PROVIDER: Leonce Katz, DO   REFERRING DIAG:  F82.87 (ICD-10-CM) - Primary osteoarthritis of left knee  M25.562,G89.29 (ICD-10-CM) - Chronic pain of left knee    THERAPY DIAG:  Chronic pain of left knee  Muscle weakness (generalized)  Rationale for Evaluation and Treatment: Rehabilitation  ONSET DATE: Chronic, over a year  SUBJECTIVE:   SUBJECTIVE STATEMENT:  Received cortisone injection 2 days ago and feels improvement. 1/10 pain left knee. Has not needed any  pain medicine.   EVAL: Pt reports chronic L knee which signifiicantly impacts her ability to walk and sleep at night. Shots have helped temporarily. Pt notes hse rides an ex bike 15-20 mins everyday.  PERTINENT HISTORY: See above. Hx MI, DM2  PAIN:  Are you having pain? Yes: NPRS scale: 1/10 Pain location: ant/medial L knee Pain description: throbbing, sharp Aggravating factors: standing, walking, sleeping Relieving factors: Rest sometimes helps  PRECAUTIONS: None  RED FLAGS: None   WEIGHT BEARING RESTRICTIONS: No  FALLS:  Has patient fallen in last 6 months? No  LIVING ENVIRONMENT: Lives with: lives alone Lives in: House/apartment Stairs: Yes: External: 3 steps; none Has following equipment at home: Single point cane  OCCUPATION: Retired  PLOF: Independent  PATIENT GOALS: Less pain, to get around better, go to church  NEXT MD VISIT: 08/21/24, Dr. Leonce  OBJECTIVE:  Note: Objective measures were completed at Evaluation unless otherwise noted.  DIAGNOSTIC FINDINGS:  L knee 12/23/23 IMPRESSION: Tricompartmental osteoarthritis, moderate in the medial compartment. Mild progression from prior exam.  PATIENT SURVEYS:  LEFS:16/80=20%  COGNITION: Overall cognitive status: Within functional limits for tasks assessed     SENSATION: WFL  EDEMA:  Swelling L knee   MUSCLE LENGTH: Hamstrings: Right NT deg; Left NT deg Thomas test: Right NT deg; Left NT deg  POSTURE: No Significant postural limitations  PALPATION: TTP ant/medial L knee joint space   LOWER EXTREMITY ROM:  Active ROM Right eval Left eval Left  08/06/24 Left 08/14/24 Left  08/16/24 Left 08/23/24  Hip flexion        Hip extension        Hip abduction        Hip adduction        Hip internal rotation        Hip external rotation        Knee flexion 135 90 112 117 120 125  Knee extension 0 15 lacking Lacks less than 5     Ankle dorsiflexion        Ankle plantarflexion        Ankle  inversion        Ankle eversion         (Blank rows = not tested)  LOWER EXTREMITY MMT:  MMT Right eval Left eval  Hip flexion 4 3  Hip extension    Hip abduction 4 3  Hip adduction    Hip internal rotation    Hip external rotation 4 3  Knee flexion 4+ 4  Knee extension 4+ 4  Ankle dorsiflexion    Ankle plantarflexion    Ankle inversion    Ankle eversion     (Blank rows = not tested)  FUNCTIONAL TESTS:  5 times sit to stand: 39 c use of hands 07/30/24: 26.4 without UE   GAIT: Distance walked: 200' Assistive device utilized: None Level of assistance: Complete Independence Comments: Mod antalgic gait pattern over L LE at a decreased  pace and flexed L knee                                                                                                                  TREATMENT DATE:  [facility] Adult PT Treatment:                                                DATE: 08/23/24 Therapeutic Exercise: Rec Bike L2 x 5 minutes  4 inch step up x 10 4 inch lateral step up x 10  Bilat heel raise x 15 Tandem stance 25 sec  each  Gastroc stretch  STS x 10 LAQ 5# 2 x 10 Seated H/S curl GTB band x 20 Bridge with ball squeeze x 10  Hip flexor stretch left EOM with knee flex 10 sec x 3  QS x 10 into towel  SLR c QS 2 x 10 SAQ 5# 10 x2 Therapeutic Activity Gait with cues for heel strike and equal step length   Manual Therapy: [email or web address] Above KT tape applied for anterior knee pain   [facility] Adult PT Treatment:                                                DATE: 08/16/24 Therapeutic Exercise: Seated H/S curl red band 10 x 2  LAQ 3# x 12 Bridge with ball squeeze x 10  Hip flexor stretch left EOM with knee flex 10 sec x 3  QS x 10 into towel  SLR c QS 2 x 10 SAQ 3# 10 x 2  Supine hamstring stretch with strap STS 5 x 2   Bilat heel raise  Tandem stance 15 sec x 3 each  Rec Bike L1 x 5 minutes  Manual  Therapy: [email or web address] Above KT tape applied for anterior knee pain    [facility] Adult PT Treatment:                                                DATE: 08/14/24 Therapeutic Exercise: Seated heel slide 10 x 2 LAQ 2# x 15 Bridge x 10 Bridge with ball squeeze x 10  Supine heel slide AROM x15 QS x 10 into towel  SLR c QS 2 x 10 SAQ 3# x 15  Supine hamstring stretch with strap Manual Therapy: [email or web address] Above KT tape applied for anterior knee pain   [facility] Adult PT Treatment:  DATE: 08/09/24 Therapeutic Exercise: Seated heel slide  LAQ x 15 c ball squeeze Supine heel slide AA on towel/slide board  Long sit hamstring stretch with strap QS into towel x 10 SLR c QS 2 x 10, partial range due to weakness SAQ x 10  Bridge x10 c ball squeeze, partial range due to weakness Updated HEP Manual Therapy: [email or web address] Above KT tape applied for anterior knee pain Modalities: Estim: IFC to anterior knee 10 minutes 19mA  [facility] Adult PT Treatment:                                                DATE: 08/06/24 Therapeutic Exercise: Seated heel slide  LAQ x 10 QS into towel x 10 SLR 2 x 10 SAQ x 10  Supine heel slide AA on towel/slide board  Supine hamstring stretch with strap Manual Therapy: [email or web address] Above KT tape applied for anterior knee pain Modalities: Estim: IFC to anterior knee 10 minutes 19mA    PATIENT EDUCATION:  Education details: Eval findings, POC, HEP, self care  Person educated: Patient Education method: Explanation, Demonstration, Tactile cues, Verbal cues, and Handouts Education comprehension: verbalized understanding, returned demonstration, verbal cues required, and tactile cues required  HOME EXERCISE PROGRAM: Access Code: [email or web address] URL: [email or web address] Date:  08/09/2024 Prepared by: Dasie Daft  Exercises - Supine Quad Set  - 3 x daily - 7 x weekly - 2 sets - 10 reps - 5 hold - Active Straight Leg Raise with Quad Set  - 3 x daily - 7 x weekly - 2 sets - 10 reps - 3 hold - Seated Long Arc Quad  - 1 x daily - 7 x weekly - 2 sets - 10 reps - 5 hold - Seated Table Hamstring Stretch  - 1 x daily - 7 x weekly - 1 sets - 3 reps - 30 hold - Supine Bridge  - 1 x daily - 7 x weekly - 2 sets - 10 reps - 3 hold - Standing Tandem Balance with Counter Support  - 1 x daily - 7 x weekly - 3 sets - 20-30 hold - Sit to stand with control  - 1 x daily - 7 x weekly - 2 sets - 5-10 reps  ASSESSMENT:  CLINICAL IMPRESSION: PT was continued for L LE/quad strengthening and ROM. AROM of the L knee continues to be improved. Gait is improved, provided min cues to equal step length. Does stagger a bit.  Continued closed chain strengthening with STS, supported squats and 4 inch step ups. Continued static balance. Consider DGI or gait with head turns to address dynamic balance.  Pt will continue to benefit from skilled PT to address impairments for improved L knee/LE function with minimized pain.   EVAL: Patient is a 73 y.o. female who was seen today for physical therapy evaluation and treatment for  M17.12 (ICD-10-CM) - Primary osteoarthritis of left knee  M25.562,G89.29 (ICD-10-CM) - Chronic pain of left knee  Pt presents to PT with chronic L knee pain with decreased AROM and knee/hip strength, and pt walks with a mod antalgic gait pattern. Observation reveals arthritic changes and swelling. Pt was started on a HEP and instructed in self care. Pt may benefit from skilled PT 2w6 to address impairments to optimize L knee function with less pain.   OBJECTIVE IMPAIRMENTS: decreased activity tolerance, decreased balance, difficulty walking,  decreased ROM, decreased strength, increased edema, and pain.   ACTIVITY LIMITATIONS: carrying, bending, squatting, stairs, transfers, and  locomotion level  PARTICIPATION LIMITATIONS: meal prep, cleaning, laundry, shopping, community activity, and church  PERSONAL FACTORS: Fitness, Past/current experiences, Time since onset of injury/illness/exacerbation, and 1 comorbidity: DM2 are also affecting patient's functional outcome.   REHAB POTENTIAL: Fair chronicity of issue  CLINICAL DECISION MAKING: Evolving/moderate complexity  EVALUATION COMPLEXITY: Moderate   GOALS:  SHORT TERM GOALS = LTGs  LONG TERM GOALS: Target date: 09/14/24  Pt will be Ind in a final HEP to maintain achieved LOF Baseline: started Goal status: ONGOING  2.  Pt will report 50% or greater improvement in her L knee pain for improved function and QOL Baseline:  Goal status: INITIAL  3.  Improve 5xSTS by 10 as indication of improved functional mobility  Baseline: 39 with use of hands 07/30/24: 26.4 sec Goal status: MET   4.  Pt will demonstrate an improved quality of gait walking with only a minimal to no antalgic gait pattern as indication of improved pain  Baseline: mod antalgic gait pattern Goal status: INITIAL  5.  Pt's LEFS score will improve by the MCID to 35% as indication of improved function  Baseline: 20% Goal status: INITIAL   PLAN:  PT FREQUENCY: 2x/week  PT DURATION: 6 weeks  PLANNED INTERVENTIONS: 97164- PT Re-evaluation, 97110-Therapeutic exercises, 97530- Therapeutic activity, 97112- Neuromuscular re-education, 97535- Self Care, [id number]- Manual therapy, [id number]- Gait training, [phone]- Aquatic Therapy, [phone]- Electrical stimulation (unattended), [phone]- Ionotophoresis 4mg /ml Dexamethasone, 79439 (1-2 muscles), 20561 (3+ muscles)- Dry Needling, Patient/Family education, Balance training, Stair training, Taping, Joint mobilization, Cryotherapy, and Moist heat  PLAN FOR NEXT SESSION: Assess response to HEP; progress therex as indicated; use of modalities, manual therapy; and TPDN as indicated. KT tape PRN   Harlene Persons,  PTA 08/23/24 8:45 AM Phone: [phone] Fax: [phone]

## 2024-08-24 ENCOUNTER — Ambulatory Visit (HOSPITAL_COMMUNITY)
Admission: RE | Admit: 2024-08-24 | Discharge: 2024-08-24 | Disposition: A | Discharge status: Home / Self Care | Source: Ambulatory Visit | Attending: Cardiovascular Disease | Admitting: Cardiovascular Disease

## 2024-08-24 DIAGNOSIS — R011 Cardiac murmur, unspecified: Secondary | ICD-10-CM | POA: Diagnosis not present

## 2024-08-24 LAB — ECHOCARDIOGRAM COMPLETE
Area-P 1/2: 3.37 cm2
S' Lateral: 2.8 cm

## 2024-08-27 ENCOUNTER — Ambulatory Visit: Payer: Self-pay | Admitting: Cardiovascular Disease

## 2024-08-27 NOTE — Therapy (Signed)
 OUTPATIENT PHYSICAL THERAPY LOWER EXTREMITY TREATMENT   Patient Name: [name] MRN: [id number] DOB:04-Feb-1951, 73 y.o., female Today's Date: 08/28/2024  END OF SESSION:  PT End of Session - 08/28/24 0812     Visit Number 8    Number of Visits 13    Date for Recertification  09/14/24    Authorization Type [employer]    PT Start Time 0804    PT Stop Time 0848    PT Time Calculation (min) 44 min    Activity Tolerance Patient tolerated treatment well;Patient limited by pain    Behavior During Therapy [facility] for tasks assessed/performed            Past Medical History:  Diagnosis Date   Anemia    Arthritis    Cardiomyopathy (HCC)    Cholelithiasis    Chronic back pain    Constipation    Esophageal reflux    Fibromyalgia    Hemorrhoids    Hyperlipidemia    Hypertension    Hypothyroid    IBS (irritable bowel syndrome)    Internal hemorrhoids without mention of complication    Left leg pain    Myocardial infarction [facility])    Personal history of colonic polyps 05/06/2010   ADENOMATOUS POLYP   Pneumonia    Tubular adenoma of colon    Past Surgical History:  Procedure Laterality Date   ABDOMINAL HYSTERECTOMY     CERVICAL DISC SURGERY     PLATE IN NECK & BACK   CHOLECYSTECTOMY     COLONOSCOPY  09/27/2011   NORMAL    COLONOSCOPY N/A 07/17/2024   Procedure: COLONOSCOPY;  Surgeon: Albertus Gordy HERO, MD;  Location: [facility] ENDOSCOPY;  Service: Gastroenterology;  Laterality: N/A;   COLONOSCOPY WITH PROPOFOL  N/A 10/06/2020   Procedure: COLONOSCOPY WITH PROPOFOL ;  Surgeon: Wilhelmenia Aloha Raddle., MD;  Location: [facility] ENDOSCOPY;  Service: Gastroenterology;  Laterality: N/A;  ultra slim scope    LEFT HEART CATH AND CORONARY ANGIOGRAPHY N/A 05/08/2018   Procedure: LEFT HEART CATH AND CORONARY ANGIOGRAPHY;  Surgeon: Claudene Victory ORN, MD;  Location: [facility] INVASIVE CV LAB;  Service: Cardiovascular;  Laterality: N/A;   LUMBAR DISC SURGERY     POLYPECTOMY  10/06/2020   Procedure:  POLYPECTOMY;  Surgeon: Mansouraty, Aloha Raddle., MD;  Location: [facility] ENDOSCOPY;  Service: Gastroenterology;;   TONSILLECTOMY     Patient Active Problem List   Diagnosis Date Noted   Benign neoplasm of sigmoid colon 07/17/2024   Encounter for immunization 01/08/2024   Eczema of scalp 01/05/2023   Encounter for general adult medical examination with abnormal findings 01/03/2023   Somnolence 09/23/2022   Flu vaccine need 09/14/2022   Sensorineural hearing loss (SNHL) of left ear with unrestricted hearing of right ear 07/21/2022   Primary osteoarthritis of left knee 12/24/2021   Onychomycosis of left great toe 12/14/2021   Type II diabetes mellitus with manifestations (HCC) 12/14/2021   Balding 05/21/2020   Allergic rhinitis 04/12/2020   Acute URI 03/20/2019   Takotsubo cardiomyopathy 02/26/2019   NASH (nonalcoholic steatohepatitis) 01/22/2019   Chronic idiopathic constipation 01/12/2019   Arthritis of carpometacarpal (CMC) joint of left thumb 09/25/2018   Degenerative arthritis of left knee 05/24/2018   CAD (coronary artery disease) 05/16/2018   Vitamin D  deficiency 11/11/2017   Degenerative disc disease, lumbar 08/29/2017   Obesity (BMI 30.0-34.9) 06/14/2016   Osteopenia 07/20/2013   GERD (gastroesophageal reflux disease) 09/16/2011   Generalized anxiety disorder 09/16/2011   History of colonic polyps 08/17/2011  DJD (degenerative joint disease) of knee 06/25/2011   Pure hyperglyceridemia 06/24/2011   Hyperlipidemia LDL goal <70 07/30/2009   Hypothyroidism 04/25/2009   Essential hypertension 04/25/2009    PCP: [name], MD  REFERRING PROVIDER: Leonce Katz, DO   REFERRING DIAG:  [phone] (ICD-10-CM) - Primary osteoarthritis of left knee  M25.562,G89.29 (ICD-10-CM) - Chronic pain of left knee    THERAPY DIAG:  Chronic pain of left knee  Muscle weakness (generalized)  Difficulty in walking, not elsewhere classified  Pain in left lower leg  Rationale for  Evaluation and Treatment: Rehabilitation  ONSET DATE: Chronic, over a year  SUBJECTIVE:   SUBJECTIVE STATEMENT:  Pt reports her L knee pain is much better. Just a little soreness. Pt notes being pleased with how she is progressing.  EVAL: Pt reports chronic L knee which signifiicantly impacts her ability to walk and sleep at night. Shots have helped temporarily. Pt notes hse rides an ex bike 15-20 mins everyday.  PERTINENT HISTORY: See above. Hx MI, DM2  PAIN:  Are you having pain? Yes: NPRS scale: 1/10 Pain location: ant/medial L knee Pain description: throbbing, sharp Aggravating factors: standing, walking, sleeping Relieving factors: Rest sometimes helps  PRECAUTIONS: None  RED FLAGS: None   WEIGHT BEARING RESTRICTIONS: No  FALLS:  Has patient fallen in last 6 months? No  LIVING ENVIRONMENT: Lives with: lives alone Lives in: House/apartment Stairs: Yes: External: 3 steps; none Has following equipment at home: Single point cane  OCCUPATION: Retired  PLOF: Independent  PATIENT GOALS: Less pain, to get around better, go to church  NEXT MD VISIT: 08/21/24, Dr. Leonce  OBJECTIVE:  Note: Objective measures were completed at Evaluation unless otherwise noted.  DIAGNOSTIC FINDINGS:  L knee 12/23/23 IMPRESSION: Tricompartmental osteoarthritis, moderate in the medial compartment. Mild progression from prior exam.  PATIENT SURVEYS:  LEFS:16/80=20%  COGNITION: Overall cognitive status: Within functional limits for tasks assessed     SENSATION: WFL  EDEMA:  Swelling L knee   MUSCLE LENGTH: Hamstrings: Right NT deg; Left NT deg Thomas test: Right NT deg; Left NT deg  POSTURE: No Significant postural limitations  PALPATION: TTP ant/medial L knee joint space   LOWER EXTREMITY ROM:  Active ROM Right eval Left eval Left  08/06/24 Left 08/14/24 Left  08/16/24 Left 08/23/24  Hip flexion        Hip extension        Hip abduction        Hip adduction         Hip internal rotation        Hip external rotation        Knee flexion 135 90 112 117 120 125  Knee extension 0 15 lacking Lacks less than 5     Ankle dorsiflexion        Ankle plantarflexion        Ankle inversion        Ankle eversion         (Blank rows = not tested)  LOWER EXTREMITY MMT:  MMT Right eval Left eval  Hip flexion 4 3  Hip extension    Hip abduction 4 3  Hip adduction    Hip internal rotation    Hip external rotation 4 3  Knee flexion 4+ 4  Knee extension 4+ 4  Ankle dorsiflexion    Ankle plantarflexion    Ankle inversion    Ankle eversion     (Blank rows = not tested)  FUNCTIONAL TESTS:  5 times sit to stand: 39 c use of hands 07/30/24: 26.4 without UE   GAIT: Distance walked: 200' Assistive device utilized: None Level of assistance: Complete Independence Comments: Mod antalgic gait pattern over L LE at a decreased pace and flexed L knee                                                                                                                  TREATMENT DATE:  [facility] Adult PT Treatment:                                                DATE: 08/28/24 Therapeutic Exercise: Rec Bike L3 x 5 minutes  4 inch lateral step up x 10  Bilat heel raise x 15 Tandem stance 25 sec  each  Gastroc stretch  STS x 10 LAQ 5# 2 x 10 Seated H/S curl GTB band x 20 Bridge with ball squeeze x 15  Hip flexor stretch left EOM with knee flex 10 sec x 3  SLR c QS 2 x 10 Therapeutic Activity: Dynamic Gait Index  Mark the lowest level that applies.   Date Performed 08/28/24  Gait level surface (3) Normal: walks 20', no AD, good speed, no evidence for imbalance, normal gait pattern  2. Change in gait speed (2) Mild Impairment: Is able to change speed but demonstrates mild gait deviations, or not gait deviations but unable to achieve a significant change in velocity, or uses an assistive device  3. Gait with horizontal head turns (3) Normal: Performs head turns smoothly  with no change in gait  4. Gait with vertical head turns (2) Mild Impairment: Performs head turns smoothly with slight change in gait velocity, i.e., minor disruption to smooth gait path or uses walking aid  5. Gait and pivot turn (3) Normal: Is able to step over the box without changing gait speed, no evidence of imbalance  6. Step over obstacle (3) Normal: Is able to step over the box without changing gait speed, no evidence of imbalance  7. Step around obstacle (3) Normal: Is able to walk around cones safely without changing gait speed; no evidence of imbalance  8. Steps (3) Normal: Alternating feet, no rail  Total score 22/24   Score Interpretation: Score of <19 indicates high risk of falls. Manual Therapy: [email or web address] Above KT tape applied for anterior knee    [facility] Adult PT Treatment:                                                DATE: 08/23/24 Therapeutic Exercise: Rec Bike L2 x 5 minutes  4 inch step up x 10 4 inch lateral step up x 10  Bilat heel raise x 15 Tandem stance 25 sec  each  Gastroc stretch  STS x 10 LAQ 5# 2 x 10 Seated H/S curl GTB band x 20 Bridge with ball squeeze x 10  Hip flexor stretch left EOM with knee flex 10 sec x 3  QS x 10 into towel  SLR c QS 2 x 10 SAQ 5# 10 x2 Therapeutic Activity: Gait with cues for heel strike and equal step length Manual Therapy: [email or web address] Above KT tape applied for anterior knee pain  PATIENT EDUCATION:  Education details: Eval findings, POC, HEP, self care  Person educated: Patient Education method: Explanation, Demonstration, Tactile cues, Verbal cues, and Handouts Education comprehension: verbalized understanding, returned demonstration, verbal cues required, and tactile cues required  HOME EXERCISE PROGRAM: Access Code: [email or web address] URL: [email or web address] Date: 08/09/2024 Prepared by: Dasie Daft  Exercises - Supine Quad Set  - 3 x  daily - 7 x weekly - 2 sets - 10 reps - 5 hold - Active Straight Leg Raise with Quad Set  - 3 x daily - 7 x weekly - 2 sets - 10 reps - 3 hold - Seated Long Arc Quad  - 1 x daily - 7 x weekly - 2 sets - 10 reps - 5 hold - Seated Table Hamstring Stretch  - 1 x daily - 7 x weekly - 1 sets - 3 reps - 30 hold - Supine Bridge  - 1 x daily - 7 x weekly - 2 sets - 10 reps - 3 hold - Standing Tandem Balance with Counter Support  - 1 x daily - 7 x weekly - 3 sets - 20-30 hold - Sit to stand with control  - 1 x daily - 7 x weekly - 2 sets - 5-10 reps  ASSESSMENT:  CLINICAL IMPRESSION: Continued knee K-taping and L LE/quad strengthening. Completed balance activities with administering the DGI. Pt demonstrated good balance with low fall risk with the DGI assessment. L knee pain continues to be much improved.   EVAL: Patient is a 73 y.o. female who was seen today for physical therapy evaluation and treatment for  M17.12 (ICD-10-CM) - Primary osteoarthritis of left knee  M25.562,G89.29 (ICD-10-CM) - Chronic pain of left knee  Pt presents to PT with chronic L knee pain with decreased AROM and knee/hip strength, and pt walks with a mod antalgic gait pattern. Observation reveals arthritic changes and swelling. Pt was started on a HEP and instructed in self care. Pt may benefit from skilled PT 2w6 to address impairments to optimize L knee function with less pain.   OBJECTIVE IMPAIRMENTS: decreased activity tolerance, decreased balance, difficulty walking, decreased ROM, decreased strength, increased edema, and pain.   ACTIVITY LIMITATIONS: carrying, bending, squatting, stairs, transfers, and locomotion level  PARTICIPATION LIMITATIONS: meal prep, cleaning, laundry, shopping, community activity, and church  PERSONAL FACTORS: Fitness, Past/current experiences, Time since onset of injury/illness/exacerbation, and 1 comorbidity: DM2 are also affecting patient's functional outcome.   REHAB POTENTIAL: Fair  chronicity of issue  CLINICAL DECISION MAKING: Evolving/moderate complexity  EVALUATION COMPLEXITY: Moderate   GOALS:  SHORT TERM GOALS = LTGs  LONG TERM GOALS: Target date: 09/14/24  Pt will be Ind in a final HEP to maintain achieved LOF Baseline: started Goal status: ONGOING  2.  Pt will report 50% or greater improvement in her L knee pain for improved function and QOL Baseline:  Goal status: INITIAL  3.  Improve 5xSTS by 10 as indication of improved functional mobility  Baseline: 39 with use of hands 07/30/24: 26.4 sec Goal status: MET  4.  Pt will demonstrate an improved quality of gait walking with only a minimal to no antalgic gait pattern as indication of improved pain  Baseline: mod antalgic gait pattern Goal status: INITIAL  5.  Pt's LEFS score will improve by the MCID to 35% as indication of improved function  Baseline: 20% Goal status: INITIAL   PLAN:  PT FREQUENCY: 2x/week  PT DURATION: 6 weeks  PLANNED INTERVENTIONS: 97164- PT Re-evaluation, 97110-Therapeutic exercises, 97530- Therapeutic activity, 97112- Neuromuscular re-education, 97535- Self Care, [id number]- Manual therapy, [id number]- Gait training, [phone]- Aquatic Therapy, [phone]- Electrical stimulation (unattended), [phone]- Ionotophoresis 4mg /ml Dexamethasone, 79439 (1-2 muscles), 20561 (3+ muscles)- Dry Needling, Patient/Family education, Balance training, Stair training, Taping, Joint mobilization, Cryotherapy, and Moist heat  PLAN FOR NEXT SESSION: Assess response to HEP; progress therex as indicated; use of modalities, manual therapy; and TPDN as indicated. KT tape PRN   Dasie Daft MS, PT 08/28/24 9:27 AM

## 2024-08-28 ENCOUNTER — Ambulatory Visit

## 2024-08-28 DIAGNOSIS — G8929 Other chronic pain: Secondary | ICD-10-CM

## 2024-08-28 DIAGNOSIS — M25562 Pain in left knee: Secondary | ICD-10-CM | POA: Diagnosis not present

## 2024-08-28 DIAGNOSIS — M79662 Pain in left lower leg: Secondary | ICD-10-CM

## 2024-08-28 DIAGNOSIS — M6281 Muscle weakness (generalized): Secondary | ICD-10-CM

## 2024-08-28 DIAGNOSIS — R262 Difficulty in walking, not elsewhere classified: Secondary | ICD-10-CM

## 2024-08-30 ENCOUNTER — Ambulatory Visit: Attending: Sports Medicine

## 2024-08-30 DIAGNOSIS — M79662 Pain in left lower leg: Secondary | ICD-10-CM | POA: Insufficient documentation

## 2024-08-30 DIAGNOSIS — M6281 Muscle weakness (generalized): Secondary | ICD-10-CM | POA: Diagnosis not present

## 2024-08-30 DIAGNOSIS — G8929 Other chronic pain: Secondary | ICD-10-CM | POA: Diagnosis not present

## 2024-08-30 DIAGNOSIS — R262 Difficulty in walking, not elsewhere classified: Secondary | ICD-10-CM | POA: Insufficient documentation

## 2024-08-30 DIAGNOSIS — M25562 Pain in left knee: Secondary | ICD-10-CM | POA: Insufficient documentation

## 2024-08-30 NOTE — Therapy (Signed)
 OUTPATIENT PHYSICAL THERAPY LOWER EXTREMITY TREATMENT   Patient Name: [name] MRN: [id number] DOB:07/30/1951, 73 y.o., female Today's Date: 08/30/2024  END OF SESSION:  PT End of Session - 08/30/24 0813     Visit Number 9    Number of Visits 13    Date for Recertification  09/14/24    Authorization Type [employer]    PT Start Time 0803    PT Stop Time 0844    PT Time Calculation (min) 41 min    Activity Tolerance Patient tolerated treatment well    Behavior During Therapy [facility] for tasks assessed/performed             Past Medical History:  Diagnosis Date   Anemia    Arthritis    Cardiomyopathy (HCC)    Cholelithiasis    Chronic back pain    Constipation    Esophageal reflux    Fibromyalgia    Hemorrhoids    Hyperlipidemia    Hypertension    Hypothyroid    IBS (irritable bowel syndrome)    Internal hemorrhoids without mention of complication    Left leg pain    Myocardial infarction [facility])    Personal history of colonic polyps 05/06/2010   ADENOMATOUS POLYP   Pneumonia    Tubular adenoma of colon    Past Surgical History:  Procedure Laterality Date   ABDOMINAL HYSTERECTOMY     CERVICAL DISC SURGERY     PLATE IN NECK & BACK   CHOLECYSTECTOMY     COLONOSCOPY  09/27/2011   NORMAL    COLONOSCOPY N/A 07/17/2024   Procedure: COLONOSCOPY;  Surgeon: Albertus Gordy HERO, MD;  Location: [facility] ENDOSCOPY;  Service: Gastroenterology;  Laterality: N/A;   COLONOSCOPY WITH PROPOFOL  N/A 10/06/2020   Procedure: COLONOSCOPY WITH PROPOFOL ;  Surgeon: Wilhelmenia Aloha Raddle., MD;  Location: [facility] ENDOSCOPY;  Service: Gastroenterology;  Laterality: N/A;  ultra slim scope    LEFT HEART CATH AND CORONARY ANGIOGRAPHY N/A 05/08/2018   Procedure: LEFT HEART CATH AND CORONARY ANGIOGRAPHY;  Surgeon: Claudene Victory ORN, MD;  Location: [facility] INVASIVE CV LAB;  Service: Cardiovascular;  Laterality: N/A;   LUMBAR DISC SURGERY     POLYPECTOMY  10/06/2020   Procedure: POLYPECTOMY;  Surgeon:  Mansouraty, Aloha Raddle., MD;  Location: [facility] ENDOSCOPY;  Service: Gastroenterology;;   TONSILLECTOMY     Patient Active Problem List   Diagnosis Date Noted   Benign neoplasm of sigmoid colon 07/17/2024   Encounter for immunization 01/08/2024   Eczema of scalp 01/05/2023   Encounter for general adult medical examination with abnormal findings 01/03/2023   Somnolence 09/23/2022   Flu vaccine need 09/14/2022   Sensorineural hearing loss (SNHL) of left ear with unrestricted hearing of right ear 07/21/2022   Primary osteoarthritis of left knee 12/24/2021   Onychomycosis of left great toe 12/14/2021   Type II diabetes mellitus with manifestations (HCC) 12/14/2021   Balding 05/21/2020   Allergic rhinitis 04/12/2020   Acute URI 03/20/2019   Takotsubo cardiomyopathy 02/26/2019   NASH (nonalcoholic steatohepatitis) 01/22/2019   Chronic idiopathic constipation 01/12/2019   Arthritis of carpometacarpal (CMC) joint of left thumb 09/25/2018   Degenerative arthritis of left knee 05/24/2018   CAD (coronary artery disease) 05/16/2018   Vitamin D  deficiency 11/11/2017   Degenerative disc disease, lumbar 08/29/2017   Obesity (BMI 30.0-34.9) 06/14/2016   Osteopenia 07/20/2013   GERD (gastroesophageal reflux disease) 09/16/2011   Generalized anxiety disorder 09/16/2011   History of colonic polyps 08/17/2011   DJD (degenerative  joint disease) of knee 06/25/2011   Pure hyperglyceridemia 06/24/2011   Hyperlipidemia LDL goal <70 07/30/2009   Hypothyroidism 04/25/2009   Essential hypertension 04/25/2009    PCP: [name], MD  REFERRING PROVIDER: Leonce Katz, DO   REFERRING DIAG:  [phone] (ICD-10-CM) - Primary osteoarthritis of left knee  M25.562,G89.29 (ICD-10-CM) - Chronic pain of left knee    THERAPY DIAG:  Chronic pain of left knee  Muscle weakness (generalized)  Difficulty in walking, not elsewhere classified  Rationale for Evaluation and Treatment: Rehabilitation  ONSET  DATE: Chronic, over a year  SUBJECTIVE:   SUBJECTIVE STATEMENT:  Pt denies L knee pain today.  EVAL: Pt reports chronic L knee which signifiicantly impacts her ability to walk and sleep at night. Shots have helped temporarily. Pt notes hse rides an ex bike 15-20 mins everyday.  PERTINENT HISTORY: See above. Hx MI, DM2  PAIN:  Are you having pain? Yes: NPRS scale: 0/10 Pain location: ant/medial L knee Pain description: throbbing, sharp Aggravating factors: standing, walking, sleeping Relieving factors: Rest sometimes helps  PRECAUTIONS: None  RED FLAGS: None   WEIGHT BEARING RESTRICTIONS: No  FALLS:  Has patient fallen in last 6 months? No  LIVING ENVIRONMENT: Lives with: lives alone Lives in: House/apartment Stairs: Yes: External: 3 steps; none Has following equipment at home: Single point cane  OCCUPATION: Retired  PLOF: Independent  PATIENT GOALS: Less pain, to get around better, go to church  NEXT MD VISIT: 08/21/24, Dr. Leonce  OBJECTIVE:  Note: Objective measures were completed at Evaluation unless otherwise noted.  DIAGNOSTIC FINDINGS:  L knee 12/23/23 IMPRESSION: Tricompartmental osteoarthritis, moderate in the medial compartment. Mild progression from prior exam.  PATIENT SURVEYS:  LEFS:16/80=20%  COGNITION: Overall cognitive status: Within functional limits for tasks assessed     SENSATION: WFL  EDEMA:  Swelling L knee   MUSCLE LENGTH: Hamstrings: Right NT deg; Left NT deg Thomas test: Right NT deg; Left NT deg  POSTURE: No Significant postural limitations  PALPATION: TTP ant/medial L knee joint space   LOWER EXTREMITY ROM:  Active ROM Right eval Left eval Left  08/06/24 Left 08/14/24 Left  08/16/24 Left 08/23/24  Hip flexion        Hip extension        Hip abduction        Hip adduction        Hip internal rotation        Hip external rotation        Knee flexion 135 90 112 117 120 125  Knee extension 0 15 lacking Lacks less  than 5     Ankle dorsiflexion        Ankle plantarflexion        Ankle inversion        Ankle eversion         (Blank rows = not tested)  LOWER EXTREMITY MMT:  MMT Right eval Left eval  Hip flexion 4 3  Hip extension    Hip abduction 4 3  Hip adduction    Hip internal rotation    Hip external rotation 4 3  Knee flexion 4+ 4  Knee extension 4+ 4  Ankle dorsiflexion    Ankle plantarflexion    Ankle inversion    Ankle eversion     (Blank rows = not tested)  FUNCTIONAL TESTS:  5 times sit to stand: 39 c use of hands 07/30/24: 26.4 without UE   GAIT: Distance walked: 200' Assistive device utilized: None  Level of assistance: Complete Independence Comments: Mod antalgic gait pattern over L LE at a decreased pace and flexed L knee                                                                                                                  TREATMENT DATE:  [facility] Adult PT Treatment:                                                DATE: 08/30/24 Therapeutic Exercise/Activity: Rec Bike L2 x 5 minutes  QS x 10 into towel  SLR c QS x 15 SAQ 5# x15 Bridge with ball squeeze x 15 Hip flexor stretch left EOM with knee flex 15 sec x 3  Supine hamstring stretch 15 sec x 3 STS x 10 Bilat heel raise x 15 Tandem and SL stance on airex 4 inch lateral step up x 15  [facility] Adult PT Treatment:                                                DATE: 08/28/24 Therapeutic Exercise: Rec Bike L3 x 5 minutes  4 inch lateral step up x 10  Bilat heel raise x 15 Tandem stance 25 sec  each  Gastroc stretch  STS x 10 LAQ 5# 2 x 10 Seated H/S curl GTB band x 20 Bridge with ball squeeze x 15  Hip flexor stretch left EOM with knee flex 10 sec x 3  SLR c QS 2 x 10 Therapeutic Activity: Dynamic Gait Index  Mark the lowest level that applies.   Date Performed 08/28/24  Gait level surface (3) Normal: walks 20', no AD, good speed, no evidence for imbalance, normal gait pattern  2. Change in gait  speed (2) Mild Impairment: Is able to change speed but demonstrates mild gait deviations, or not gait deviations but unable to achieve a significant change in velocity, or uses an assistive device  3. Gait with horizontal head turns (3) Normal: Performs head turns smoothly with no change in gait  4. Gait with vertical head turns (2) Mild Impairment: Performs head turns smoothly with slight change in gait velocity, i.e., minor disruption to smooth gait path or uses walking aid  5. Gait and pivot turn (3) Normal: Is able to step over the box without changing gait speed, no evidence of imbalance  6. Step over obstacle (3) Normal: Is able to step over the box without changing gait speed, no evidence of imbalance  7. Step around obstacle (3) Normal: Is able to walk around cones safely without changing gait speed; no evidence of imbalance  8. Steps (3) Normal: Alternating feet, no rail  Total score 22/24   Score Interpretation: Score of <19 indicates high risk  of falls. Manual Therapy: [email or web address] Above KT tape applied for anterior knee    [facility] Adult PT Treatment:                                                DATE: 08/23/24 Therapeutic Exercise: Rec Bike L2 x 5 minutes  4 inch step up x 10 4 inch lateral step up x 10  Bilat heel raise x 15 Tandem stance 25 sec  each  Gastroc stretch  STS x 10 LAQ 5# 2 x 10 Seated H/S curl GTB band x 20 Bridge with ball squeeze x 10  Hip flexor stretch left EOM with knee flex 10 sec x 3  QS x 10 into towel  SLR c QS 2 x 10 SAQ 5# 10 x2 Therapeutic Activity: Gait with cues for heel strike and equal step length Manual Therapy: [email or web address] Above KT tape applied for anterior knee pain  PATIENT EDUCATION:  Education details: Eval findings, POC, HEP, self care  Person educated: Patient Education method: Explanation, Demonstration, Tactile cues, Verbal cues, and Handouts Education  comprehension: verbalized understanding, returned demonstration, verbal cues required, and tactile cues required  HOME EXERCISE PROGRAM: Access Code: [email or web address] URL: [email or web address] Date: 08/09/2024 Prepared by: Dasie Daft  Exercises - Supine Quad Set  - 3 x daily - 7 x weekly - 2 sets - 10 reps - 5 hold - Active Straight Leg Raise with Quad Set  - 3 x daily - 7 x weekly - 2 sets - 10 reps - 3 hold - Seated Long Arc Quad  - 1 x daily - 7 x weekly - 2 sets - 10 reps - 5 hold - Seated Table Hamstring Stretch  - 1 x daily - 7 x weekly - 1 sets - 3 reps - 30 hold - Supine Bridge  - 1 x daily - 7 x weekly - 2 sets - 10 reps - 3 hold - Standing Tandem Balance with Counter Support  - 1 x daily - 7 x weekly - 3 sets - 20-30 hold - Sit to stand with control  - 1 x daily - 7 x weekly - 2 sets - 5-10 reps  ASSESSMENT:  CLINICAL IMPRESSION: K-tape is still in place. Pt was completed to continuing to work on flexibility, strength, and balance. Pt continues to report L knee pain is much improved. Pt expressed interest in learning how to complete the K-taping. Will complete Ed for K-taping next week in prep for DC in the next 2 weeks.  EVAL: Patient is a 73 y.o. female who was seen today for physical therapy evaluation and treatment for  M17.12 (ICD-10-CM) - Primary osteoarthritis of left knee  M25.562,G89.29 (ICD-10-CM) - Chronic pain of left knee  Pt presents to PT with chronic L knee pain with decreased AROM and knee/hip strength, and pt walks with a mod antalgic gait pattern. Observation reveals arthritic changes and swelling. Pt was started on a HEP and instructed in self care. Pt may benefit from skilled PT 2w6 to address impairments to optimize L knee function with less pain.   OBJECTIVE IMPAIRMENTS: decreased activity tolerance, decreased balance, difficulty walking, decreased ROM, decreased strength, increased edema, and pain.   ACTIVITY LIMITATIONS: carrying, bending,  squatting, stairs, transfers, and locomotion level  PARTICIPATION LIMITATIONS: meal prep, cleaning, laundry, shopping, community activity, and church  PERSONAL FACTORS: Fitness, Past/current experiences,  Time since onset of injury/illness/exacerbation, and 1 comorbidity: DM2 are also affecting patient's functional outcome.   REHAB POTENTIAL: Fair chronicity of issue  CLINICAL DECISION MAKING: Evolving/moderate complexity  EVALUATION COMPLEXITY: Moderate   GOALS:  SHORT TERM GOALS = LTGs  LONG TERM GOALS: Target date: 09/14/24  Pt will be Ind in a final HEP to maintain achieved LOF Baseline: started Goal status: ONGOING  2.  Pt will report 50% or greater improvement in her L knee pain for improved function and QOL Baseline:  Goal status: INITIAL  3.  Improve 5xSTS by 10 as indication of improved functional mobility  Baseline: 39 with use of hands 07/30/24: 26.4 sec Goal status: MET   4.  Pt will demonstrate an improved quality of gait walking with only a minimal to no antalgic gait pattern as indication of improved pain  Baseline: mod antalgic gait pattern Goal status: INITIAL  5.  Pt's LEFS score will improve by the MCID to 35% as indication of improved function  Baseline: 20% Goal status: INITIAL   PLAN:  PT FREQUENCY: 2x/week  PT DURATION: 6 weeks  PLANNED INTERVENTIONS: 97164- PT Re-evaluation, 97110-Therapeutic exercises, 97530- Therapeutic activity, 97112- Neuromuscular re-education, 97535- Self Care, [id number]- Manual therapy, [id number]- Gait training, [id number]- Aquatic Therapy, [phone]- Electrical stimulation (unattended), [phone]- Ionotophoresis 4mg /ml Dexamethasone, 79439 (1-2 muscles), 20561 (3+ muscles)- Dry Needling, Patient/Family education, Balance training, Stair training, Taping, Joint mobilization, Cryotherapy, and Moist heat  PLAN FOR NEXT SESSION: Assess response to HEP; progress therex as indicated; use of modalities, manual therapy; and TPDN as indicated. KT  tape PRN   Dasie Daft MS, PT 08/30/24 8:47 AM

## 2024-08-31 ENCOUNTER — Ambulatory Visit: Admitting: Physician Assistant

## 2024-09-03 NOTE — Therapy (Signed)
 OUTPATIENT PHYSICAL THERAPY LOWER EXTREMITY TREATMENT   Patient Name: [name] MRN: [id number] DOB:01-Feb-1951, 73 y.o., female Today's Date: 09/04/2024  END OF SESSION:  PT End of Session - 09/04/24 0802     Visit Number 10    Number of Visits 13    Date for Recertification  09/14/24    Authorization Type [employer]    PT Start Time 0800    PT Stop Time 0843    PT Time Calculation (min) 43 min    Activity Tolerance Patient tolerated treatment well    Behavior During Therapy WFL for tasks assessed/performed              Past Medical History:  Diagnosis Date   Anemia    Arthritis    Cardiomyopathy (HCC)    Cholelithiasis    Chronic back pain    Constipation    Esophageal reflux    Fibromyalgia    Hemorrhoids    Hyperlipidemia    Hypertension    Hypothyroid    IBS (irritable bowel syndrome)    Internal hemorrhoids without mention of complication    Left leg pain    Myocardial infarction [facility])    Personal history of colonic polyps 05/06/2010   ADENOMATOUS POLYP   Pneumonia    Tubular adenoma of colon    Past Surgical History:  Procedure Laterality Date   ABDOMINAL HYSTERECTOMY     CERVICAL DISC SURGERY     PLATE IN NECK & BACK   CHOLECYSTECTOMY     COLONOSCOPY  09/27/2011   NORMAL    COLONOSCOPY N/A 07/17/2024   Procedure: COLONOSCOPY;  Surgeon: Albertus Gordy HERO, MD;  Location: [facility] ENDOSCOPY;  Service: Gastroenterology;  Laterality: N/A;   COLONOSCOPY WITH PROPOFOL  N/A 10/06/2020   Procedure: COLONOSCOPY WITH PROPOFOL ;  Surgeon: Wilhelmenia Aloha Raddle., MD;  Location: [facility] ENDOSCOPY;  Service: Gastroenterology;  Laterality: N/A;  ultra slim scope    LEFT HEART CATH AND CORONARY ANGIOGRAPHY N/A 05/08/2018   Procedure: LEFT HEART CATH AND CORONARY ANGIOGRAPHY;  Surgeon: Claudene Victory ORN, MD;  Location: [facility] INVASIVE CV LAB;  Service: Cardiovascular;  Laterality: N/A;   LUMBAR DISC SURGERY     POLYPECTOMY  10/06/2020   Procedure: POLYPECTOMY;  Surgeon:  Mansouraty, Aloha Raddle., MD;  Location: [facility] ENDOSCOPY;  Service: Gastroenterology;;   TONSILLECTOMY     Patient Active Problem List   Diagnosis Date Noted   Benign neoplasm of sigmoid colon 07/17/2024   Encounter for immunization 01/08/2024   Eczema of scalp 01/05/2023   Encounter for general adult medical examination with abnormal findings 01/03/2023   Somnolence 09/23/2022   Flu vaccine need 09/14/2022   Sensorineural hearing loss (SNHL) of left ear with unrestricted hearing of right ear 07/21/2022   Primary osteoarthritis of left knee 12/24/2021   Onychomycosis of left great toe 12/14/2021   Type II diabetes mellitus with manifestations (HCC) 12/14/2021   Balding 05/21/2020   Allergic rhinitis 04/12/2020   Acute URI 03/20/2019   Takotsubo cardiomyopathy 02/26/2019   NASH (nonalcoholic steatohepatitis) 01/22/2019   Chronic idiopathic constipation 01/12/2019   Arthritis of carpometacarpal (CMC) joint of left thumb 09/25/2018   Degenerative arthritis of left knee 05/24/2018   CAD (coronary artery disease) 05/16/2018   Vitamin D  deficiency 11/11/2017   Degenerative disc disease, lumbar 08/29/2017   Obesity (BMI 30.0-34.9) 06/14/2016   Osteopenia 07/20/2013   GERD (gastroesophageal reflux disease) 09/16/2011   Generalized anxiety disorder 09/16/2011   History of colonic polyps 08/17/2011   DJD (  degenerative joint disease) of knee 06/25/2011   Pure hyperglyceridemia 06/24/2011   Hyperlipidemia LDL goal <70 07/30/2009   Hypothyroidism 04/25/2009   Essential hypertension 04/25/2009    PCP: [name], MD  REFERRING PROVIDER: Leonce Katz, DO   REFERRING DIAG:  F82.87 (ICD-10-CM) - Primary osteoarthritis of left knee  M25.562,G89.29 (ICD-10-CM) - Chronic pain of left knee    THERAPY DIAG:  Chronic pain of left knee  Muscle weakness (generalized)  Difficulty in walking, not elsewhere classified  Pain in left lower leg  Rationale for Evaluation and Treatment:  Rehabilitation  ONSET DATE: Chronic, over a year  SUBJECTIVE:   SUBJECTIVE STATEMENT:  Pt reports she was able to usher 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  EVAL: Pt reports chronic L knee which signifiicantly impacts her ability to walk and sleep at night. Shots have helped temporarily. Pt notes hse rides an ex bike 15-20 mins everyday.  PERTINENT HISTORY: See above. Hx MI, DM2  PAIN:  Are you having pain? Yes: NPRS scale: 1/10 Pain location: ant/medial L knee Pain description: throbbing, sharp Aggravating factors: standing, walking, sleeping Relieving factors: Rest sometimes helps  PRECAUTIONS: None  RED FLAGS: None   WEIGHT BEARING RESTRICTIONS: No  FALLS:  Has patient fallen in last 6 months? No  LIVING ENVIRONMENT: Lives with: lives alone Lives in: House/apartment Stairs: Yes: External: 3 steps; none Has following equipment at home: Single point cane  OCCUPATION: Retired  PLOF: Independent  PATIENT GOALS: Less pain, to get around better, go to church  NEXT MD VISIT: 08/21/24, Dr. Leonce  OBJECTIVE:  Note: Objective measures were completed at Evaluation unless otherwise noted.  DIAGNOSTIC FINDINGS:  L knee 12/23/23 IMPRESSION: Tricompartmental osteoarthritis, moderate in the medial compartment. Mild progression from prior exam.  PATIENT SURVEYS:  LEFS:16/80=20%  COGNITION: Overall cognitive status: Within functional limits for tasks assessed     SENSATION: WFL  EDEMA:  Swelling L knee   MUSCLE LENGTH: Hamstrings: Right NT deg; Left NT deg Thomas test: Right NT deg; Left NT deg  POSTURE: No Significant postural limitations  PALPATION: TTP ant/medial L knee joint space   LOWER EXTREMITY ROM:  Active ROM Right eval Left eval Left  08/06/24 Left 08/14/24 Left  08/16/24 Left 08/23/24  Hip flexion        Hip extension        Hip abduction        Hip adduction        Hip internal rotation        Hip external rotation        Knee flexion 135 90 112 117  120 125  Knee extension 0 15 lacking Lacks less than 5     Ankle dorsiflexion        Ankle plantarflexion        Ankle inversion        Ankle eversion         (Blank rows = not tested)  LOWER EXTREMITY MMT:  MMT Right eval Left eval  Hip flexion 4 3  Hip extension    Hip abduction 4 3  Hip adduction    Hip internal rotation    Hip external rotation 4 3  Knee flexion 4+ 4  Knee extension 4+ 4  Ankle dorsiflexion    Ankle plantarflexion    Ankle inversion    Ankle eversion     (Blank rows = not tested)  FUNCTIONAL TESTS:  5 times sit to stand: 39 c use of hands 07/30/24: 26.4 without  UE   GAIT: Distance walked: 200' Assistive device utilized: None Level of assistance: Complete Independence Comments: Mod antalgic gait pattern over L LE at a decreased pace and flexed L knee                                                                                                                  TREATMENT DATE:  [facility] Adult PT Treatment:                                                DATE: 09/04/24 Therapeutic Exercise/Activity: Rec Bike L2 x 5 minutes  SLR c QS x 15 LAQ 5# x15 STS from elevated mat table on airex 2x10 Standing hip flexor stretch Standing quad stretch Bilat heel raise x 15 Tandem and SL stance on airex Hip abd on airex 2x 10 5# bilat Manual Therapy: [email or web address] Above KT tape applied for anterior knee pain Self Care: Pt Ed for for the application of K-tape to the L knee initiated   [facility] Adult PT Treatment:                                                DATE: 08/30/24 Therapeutic Exercise/Activity: Rec Bike L2 x 5 minutes  QS x 10 into towel  SLR c QS x 15 SAQ 5# x15 Bridge with ball squeeze x 15 Hip flexor stretch left EOM with knee flex 15 sec x 3  Supine hamstring stretch 15 sec x 3 STS x 10 Bilat heel raise x 15 Tandem and SL stance on airex 4 inch lateral step up x 15  [facility] Adult PT Treatment:                                                 DATE: 08/28/24 Therapeutic Exercise: Rec Bike L3 x 5 minutes  4 inch lateral step up x 10  Bilat heel raise x 15 Tandem stance 25 sec  each  Gastroc stretch  STS x 10 LAQ 5# 2 x 10 Seated H/S curl GTB band x 20 Bridge with ball squeeze x 15  Hip flexor stretch left EOM with knee flex 10 sec x 3  SLR c QS 2 x 10 Therapeutic Activity: Dynamic Gait Index  Mark the lowest level that applies.   Date Performed 08/28/24  Gait level surface (3) Normal: walks 20', no AD, good speed, no evidence for imbalance, normal gait pattern  2. Change in gait speed (2) Mild Impairment: Is able to change speed but demonstrates mild gait deviations, or not gait deviations but unable to achieve a significant change in velocity, or  uses an assistive device  3. Gait with horizontal head turns (3) Normal: Performs head turns smoothly with no change in gait  4. Gait with vertical head turns (2) Mild Impairment: Performs head turns smoothly with slight change in gait velocity, i.e., minor disruption to smooth gait path or uses walking aid  5. Gait and pivot turn (3) Normal: Is able to step over the box without changing gait speed, no evidence of imbalance  6. Step over obstacle (3) Normal: Is able to step over the box without changing gait speed, no evidence of imbalance  7. Step around obstacle (3) Normal: Is able to walk around cones safely without changing gait speed; no evidence of imbalance  8. Steps (3) Normal: Alternating feet, no rail  Total score 22/24   Score Interpretation: Score of <19 indicates high risk of falls. Manual Therapy: [email or web address] Above KT tape applied for anterior knee    [facility] Adult PT Treatment:                                                DATE: 08/23/24 Therapeutic Exercise: Rec Bike L2 x 5 minutes  4 inch step up x 10 4 inch lateral step up x 10  Bilat heel raise x 15 Tandem stance 25 sec  each  Gastroc stretch  STS x  10 LAQ 5# 2 x 10 Seated H/S curl GTB band x 20 Bridge with ball squeeze x 10  Hip flexor stretch left EOM with knee flex 10 sec x 3  QS x 10 into towel  SLR c QS 2 x 10 SAQ 5# 10 x2 Therapeutic Activity: Gait with cues for heel strike and equal step length Manual Therapy: [email or web address] Above KT tape applied for anterior knee pain  PATIENT EDUCATION:  Education details: Eval findings, POC, HEP, self care  Person educated: Patient Education method: Explanation, Demonstration, Tactile cues, Verbal cues, and Handouts Education comprehension: verbalized understanding, returned demonstration, verbal cues required, and tactile cues required  HOME EXERCISE PROGRAM: Access Code: [email or web address] URL: [email or web address] Date: 08/09/2024 Prepared by: Dasie Daft  Exercises - Supine Quad Set  - 3 x daily - 7 x weekly - 2 sets - 10 reps - 5 hold - Active Straight Leg Raise with Quad Set  - 3 x daily - 7 x weekly - 2 sets - 10 reps - 3 hold - Seated Long Arc Quad  - 1 x daily - 7 x weekly - 2 sets - 10 reps - 5 hold - Seated Table Hamstring Stretch  - 1 x daily - 7 x weekly - 1 sets - 3 reps - 30 hold - Supine Bridge  - 1 x daily - 7 x weekly - 2 sets - 10 reps - 3 hold - Standing Tandem Balance with Counter Support  - 1 x daily - 7 x weekly - 3 sets - 20-30 hold - Sit to stand with control  - 1 x daily - 7 x weekly - 2 sets - 5-10 reps  ASSESSMENT:  CLINICAL IMPRESSION: Initiated pt Ed re: application of K-tape for her L knee. Pt watched a video and the details of application were explained by this PT as the K-tape was applied. Exs were then completed for flexibility, strengthening, and balance. Pt estimates significant decreased in L knee pain which is reflected in her marked improvement in the 5xSTS  functional test. Will have pt practice applying k-tape her next appt.  EVAL: Patient is a 73 y.o. female who was seen today for physical therapy  evaluation and treatment for  M17.12 (ICD-10-CM) - Primary osteoarthritis of left knee  M25.562,G89.29 (ICD-10-CM) - Chronic pain of left knee  Pt presents to PT with chronic L knee pain with decreased AROM and knee/hip strength, and pt walks with a mod antalgic gait pattern. Observation reveals arthritic changes and swelling. Pt was started on a HEP and instructed in self care. Pt may benefit from skilled PT 2w6 to address impairments to optimize L knee function with less pain.   OBJECTIVE IMPAIRMENTS: decreased activity tolerance, decreased balance, difficulty walking, decreased ROM, decreased strength, increased edema, and pain.   ACTIVITY LIMITATIONS: carrying, bending, squatting, stairs, transfers, and locomotion level  PARTICIPATION LIMITATIONS: meal prep, cleaning, laundry, shopping, community activity, and church  PERSONAL FACTORS: Fitness, Past/current experiences, Time since onset of injury/illness/exacerbation, and 1 comorbidity: DM2 are also affecting patient's functional outcome.   REHAB POTENTIAL: Fair chronicity of issue  CLINICAL DECISION MAKING: Evolving/moderate complexity  EVALUATION COMPLEXITY: Moderate   GOALS:  SHORT TERM GOALS = LTGs  LONG TERM GOALS: Target date: 09/14/24  Pt will be Ind in a final HEP to maintain achieved LOF Baseline: started Goal status: ONGOING  2.  Pt will report 50% or greater improvement in her L knee pain for improved function and QOL Baseline:  09/04/24: 90% Goal status: MET  3.  Improve 5xSTS by 10 as indication of improved functional mobility  Baseline: 39 with use of hands 07/30/24: 26.4 sec 09/04/24: 15.7 sec c use of hands Goal status: MET   4.  Pt will demonstrate an improved quality of gait walking with only a minimal to no antalgic gait pattern as indication of improved pain  Baseline: mod antalgic gait pattern Goal status: INITIAL  5.  Pt's LEFS score will improve by the MCID to 35% as indication of improved  function  Baseline: 20% Goal status: INITIAL   PLAN:  PT FREQUENCY: 2x/week  PT DURATION: 6 weeks  PLANNED INTERVENTIONS: [id number]- PT Re-evaluation, 97110-Therapeutic exercises, 97530- Therapeutic activity, 97112- Neuromuscular re-education, 97535- Self Care, [id number]- Manual therapy, [id number]- Gait training, [phone]- Aquatic Therapy, [phone]- Electrical stimulation (unattended), [phone]- Ionotophoresis 4mg /ml Dexamethasone, 79439 (1-2 muscles), 20561 (3+ muscles)- Dry Needling, Patient/Family education, Balance training, Stair training, Taping, Joint mobilization, Cryotherapy, and Moist heat  PLAN FOR NEXT SESSION: Assess response to HEP; progress therex as indicated; use of modalities, manual therapy; and TPDN as indicated. KT tape PRN   Dasie Daft MS, PT 09/04/24 8:51 AM

## 2024-09-04 ENCOUNTER — Ambulatory Visit

## 2024-09-04 DIAGNOSIS — M25562 Pain in left knee: Secondary | ICD-10-CM | POA: Diagnosis not present

## 2024-09-04 DIAGNOSIS — R262 Difficulty in walking, not elsewhere classified: Secondary | ICD-10-CM

## 2024-09-04 DIAGNOSIS — M79662 Pain in left lower leg: Secondary | ICD-10-CM

## 2024-09-04 DIAGNOSIS — G8929 Other chronic pain: Secondary | ICD-10-CM

## 2024-09-04 DIAGNOSIS — M6281 Muscle weakness (generalized): Secondary | ICD-10-CM

## 2024-09-05 ENCOUNTER — Ambulatory Visit
Admission: RE | Admit: 2024-09-05 | Discharge: 2024-09-05 | Disposition: A | Source: Ambulatory Visit | Attending: Internal Medicine | Admitting: Internal Medicine

## 2024-09-05 DIAGNOSIS — Z1231 Encounter for screening mammogram for malignant neoplasm of breast: Secondary | ICD-10-CM | POA: Diagnosis not present

## 2024-09-05 NOTE — Therapy (Signed)
 OUTPATIENT PHYSICAL THERAPY LOWER EXTREMITY TREATMENT   Patient Name: [name] MRN: [id number] DOB:Mar 16, 1951, 73 y.o., female Today's Date: 09/06/2024  END OF SESSION:  PT End of Session - 09/06/24 0826     Visit Number 11    Number of Visits 13    Date for Recertification  09/14/24    Authorization Type [employer]    PT Start Time 0804    PT Stop Time 0845    PT Time Calculation (min) 41 min    Activity Tolerance Patient tolerated treatment well    Behavior During Therapy WFL for tasks assessed/performed               Past Medical History:  Diagnosis Date   Anemia    Arthritis    Cardiomyopathy (HCC)    Cholelithiasis    Chronic back pain    Constipation    Esophageal reflux    Fibromyalgia    Hemorrhoids    Hyperlipidemia    Hypertension    Hypothyroid    IBS (irritable bowel syndrome)    Internal hemorrhoids without mention of complication    Left leg pain    Myocardial infarction [facility])    Personal history of colonic polyps 05/06/2010   ADENOMATOUS POLYP   Pneumonia    Tubular adenoma of colon    Past Surgical History:  Procedure Laterality Date   ABDOMINAL HYSTERECTOMY     CERVICAL DISC SURGERY     PLATE IN NECK & BACK   CHOLECYSTECTOMY     COLONOSCOPY  09/27/2011   NORMAL    COLONOSCOPY N/A 07/17/2024   Procedure: COLONOSCOPY;  Surgeon: Albertus Gordy HERO, MD;  Location: [facility] ENDOSCOPY;  Service: Gastroenterology;  Laterality: N/A;   COLONOSCOPY WITH PROPOFOL  N/A 10/06/2020   Procedure: COLONOSCOPY WITH PROPOFOL ;  Surgeon: Wilhelmenia Aloha Raddle., MD;  Location: [facility] ENDOSCOPY;  Service: Gastroenterology;  Laterality: N/A;  ultra slim scope    LEFT HEART CATH AND CORONARY ANGIOGRAPHY N/A 05/08/2018   Procedure: LEFT HEART CATH AND CORONARY ANGIOGRAPHY;  Surgeon: Claudene Victory ORN, MD;  Location: [facility] INVASIVE CV LAB;  Service: Cardiovascular;  Laterality: N/A;   LUMBAR DISC SURGERY     POLYPECTOMY  10/06/2020   Procedure: POLYPECTOMY;   Surgeon: Mansouraty, Aloha Raddle., MD;  Location: [facility] ENDOSCOPY;  Service: Gastroenterology;;   TONSILLECTOMY     Patient Active Problem List   Diagnosis Date Noted   Benign neoplasm of sigmoid colon 07/17/2024   Encounter for immunization 01/08/2024   Eczema of scalp 01/05/2023   Encounter for general adult medical examination with abnormal findings 01/03/2023   Somnolence 09/23/2022   Flu vaccine need 09/14/2022   Sensorineural hearing loss (SNHL) of left ear with unrestricted hearing of right ear 07/21/2022   Primary osteoarthritis of left knee 12/24/2021   Onychomycosis of left great toe 12/14/2021   Type II diabetes mellitus with manifestations (HCC) 12/14/2021   Balding 05/21/2020   Allergic rhinitis 04/12/2020   Acute URI 03/20/2019   Takotsubo cardiomyopathy 02/26/2019   NASH (nonalcoholic steatohepatitis) 01/22/2019   Chronic idiopathic constipation 01/12/2019   Arthritis of carpometacarpal (CMC) joint of left thumb 09/25/2018   Degenerative arthritis of left knee 05/24/2018   CAD (coronary artery disease) 05/16/2018   Vitamin D  deficiency 11/11/2017   Degenerative disc disease, lumbar 08/29/2017   Obesity (BMI 30.0-34.9) 06/14/2016   Osteopenia 07/20/2013   GERD (gastroesophageal reflux disease) 09/16/2011   Generalized anxiety disorder 09/16/2011   History of colonic polyps 08/17/2011  DJD (degenerative joint disease) of knee 06/25/2011   Pure hyperglyceridemia 06/24/2011   Hyperlipidemia LDL goal <70 07/30/2009   Hypothyroidism 04/25/2009   Essential hypertension 04/25/2009    PCP: [name], MD  REFERRING PROVIDER: Leonce Katz, DO   REFERRING DIAG:  [phone] (ICD-10-CM) - Primary osteoarthritis of left knee  M25.562,G89.29 (ICD-10-CM) - Chronic pain of left knee    THERAPY DIAG:  Chronic pain of left knee  Muscle weakness (generalized)  Difficulty in walking, not elsewhere classified  Pain in left lower leg  Rationale for Evaluation and  Treatment: Rehabilitation  ONSET DATE: Chronic, over a year  SUBJECTIVE:   SUBJECTIVE STATEMENT:  Pt reports her L knee has continued to feel much better.  EVAL: Pt reports chronic L knee which signifiicantly impacts her ability to walk and sleep at night. Shots have helped temporarily. Pt notes hse rides an ex bike 15-20 mins everyday.  PERTINENT HISTORY: See above. Hx MI, DM2  PAIN:  Are you having pain? Yes: NPRS scale: 0/10 Pain location: ant/medial L knee Pain description: throbbing, sharp Aggravating factors: standing, walking, sleeping Relieving factors: Rest sometimes helps  PRECAUTIONS: None  RED FLAGS: None   WEIGHT BEARING RESTRICTIONS: No  FALLS:  Has patient fallen in last 6 months? No  LIVING ENVIRONMENT: Lives with: lives alone Lives in: House/apartment Stairs: Yes: External: 3 steps; none Has following equipment at home: Single point cane  OCCUPATION: Retired  PLOF: Independent  PATIENT GOALS: Less pain, to get around better, go to church  NEXT MD VISIT: 08/21/24, Dr. Leonce  OBJECTIVE:  Note: Objective measures were completed at Evaluation unless otherwise noted.  DIAGNOSTIC FINDINGS:  L knee 12/23/23 IMPRESSION: Tricompartmental osteoarthritis, moderate in the medial compartment. Mild progression from prior exam.  PATIENT SURVEYS:  LEFS:16/80=20%  COGNITION: Overall cognitive status: Within functional limits for tasks assessed     SENSATION: WFL  EDEMA:  Swelling L knee   MUSCLE LENGTH: Hamstrings: Right NT deg; Left NT deg Thomas test: Right NT deg; Left NT deg  POSTURE: No Significant postural limitations  PALPATION: TTP ant/medial L knee joint space   LOWER EXTREMITY ROM:  Active ROM Right eval Left eval Left  08/06/24 Left 08/14/24 Left  08/16/24 Left 08/23/24  Hip flexion        Hip extension        Hip abduction        Hip adduction        Hip internal rotation        Hip external rotation        Knee flexion  135 90 112 117 120 125  Knee extension 0 15 lacking Lacks less than 5     Ankle dorsiflexion        Ankle plantarflexion        Ankle inversion        Ankle eversion         (Blank rows = not tested)  LOWER EXTREMITY MMT:  MMT Right eval Left eval  Hip flexion 4 3  Hip extension    Hip abduction 4 3  Hip adduction    Hip internal rotation    Hip external rotation 4 3  Knee flexion 4+ 4  Knee extension 4+ 4  Ankle dorsiflexion    Ankle plantarflexion    Ankle inversion    Ankle eversion     (Blank rows = not tested)  FUNCTIONAL TESTS:  5 times sit to stand: 39 c use of hands 07/30/24:  26.4 without UE   GAIT: Distance walked: 200' Assistive device utilized: None Level of assistance: Complete Independence Comments: Mod antalgic gait pattern over L LE at a decreased pace and flexed L knee                                                                                                                  TREATMENT DATE:  [facility] Adult PT Treatment:                                                DATE: 09/06/24 Therapeutic Exercise/Activity: Rec Bike L2 x 5 minutes  SLR c QS 2x10 LAQ 5# 2x10 STS from elevated mat table on airex 2x10 Standing hip flexor stretch Standing quad stretch Bilat heel raise x 15 Lateral step ups 2x10 5 on airex Hip abd on airex 2x 10 5# bilat Manual Therapy: [email or web address] Above KT tape applied for anterior knee pain Self Care: Pt Ed for for the application of K-tape to the L knee was continued with pt assisted with applying the tape properly  [facility] Adult PT Treatment:                                                DATE: 09/04/24 Therapeutic Exercise/Activity: Rec Bike L2 x 5 minutes  SLR c QS x 15 LAQ 5# x15 STS from elevated mat table on airex 2x10 Standing hip flexor stretch Standing quad stretch Bilat heel raise x 15 Tandem and SL stance on airex Hip abd on airex 2x 10 5# bilat Manual  Therapy: [email or web address] Above KT tape applied for anterior knee pain Self Care: Pt Ed for for the application of K-tape to the L knee initiated   [facility] Adult PT Treatment:                                                DATE: 08/30/24 Therapeutic Exercise/Activity: Rec Bike L2 x 5 minutes  QS x 10 into towel  SLR c QS x 15 SAQ 5# x15 Bridge with ball squeeze x 15 Hip flexor stretch left EOM with knee flex 15 sec x 3  Supine hamstring stretch 15 sec x 3 STS x 10 Bilat heel raise x 15 Tandem and SL stance on airex 4 inch lateral step up x 15  [facility] Adult PT Treatment:                                                DATE: 08/28/24  Therapeutic Exercise: Rec Bike L3 x 5 minutes  4 inch lateral step up x 10  Bilat heel raise x 15 Tandem stance 25 sec  each  Gastroc stretch  STS x 10 LAQ 5# 2 x 10 Seated H/S curl GTB band x 20 Bridge with ball squeeze x 15  Hip flexor stretch left EOM with knee flex 10 sec x 3  SLR c QS 2 x 10 Therapeutic Activity: Dynamic Gait Index  Mark the lowest level that applies.   Date Performed 08/28/24  Gait level surface (3) Normal: walks 20', no AD, good speed, no evidence for imbalance, normal gait pattern  2. Change in gait speed (2) Mild Impairment: Is able to change speed but demonstrates mild gait deviations, or not gait deviations but unable to achieve a significant change in velocity, or uses an assistive device  3. Gait with horizontal head turns (3) Normal: Performs head turns smoothly with no change in gait  4. Gait with vertical head turns (2) Mild Impairment: Performs head turns smoothly with slight change in gait velocity, i.e., minor disruption to smooth gait path or uses walking aid  5. Gait and pivot turn (3) Normal: Is able to step over the box without changing gait speed, no evidence of imbalance  6. Step over obstacle (3) Normal: Is able to step over the box without changing gait speed, no evidence of imbalance   7. Step around obstacle (3) Normal: Is able to walk around cones safely without changing gait speed; no evidence of imbalance  8. Steps (3) Normal: Alternating feet, no rail  Total score 22/24   Score Interpretation: Score of <19 indicates high risk of falls. Manual Therapy: [email or web address] Above KT tape applied for anterior knee   PATIENT EDUCATION:  Education details: Eval findings, POC, HEP, self care  Person educated: Patient Education method: Explanation, Demonstration, Tactile cues, Verbal cues, and Handouts Education comprehension: verbalized understanding, returned demonstration, verbal cues required, and tactile cues required  HOME EXERCISE PROGRAM: Access Code: [email or web address] URL: [email or web address] Date: 08/09/2024 Prepared by: Dasie Daft  Exercises - Supine Quad Set  - 3 x daily - 7 x weekly - 2 sets - 10 reps - 5 hold - Active Straight Leg Raise with Quad Set  - 3 x daily - 7 x weekly - 2 sets - 10 reps - 3 hold - Seated Long Arc Quad  - 1 x daily - 7 x weekly - 2 sets - 10 reps - 5 hold - Seated Table Hamstring Stretch  - 1 x daily - 7 x weekly - 1 sets - 3 reps - 30 hold - Supine Bridge  - 1 x daily - 7 x weekly - 2 sets - 10 reps - 3 hold - Standing Tandem Balance with Counter Support  - 1 x daily - 7 x weekly - 3 sets - 20-30 hold - Sit to stand with control  - 1 x daily - 7 x weekly - 2 sets - 5-10 reps  ASSESSMENT:  CLINICAL IMPRESSION: Continued instruction in the application of K-tape with pt. Today, pt was was able to apply the tape with assistance from this PT. Exs were then completed for strengthening and balance. Pt tolerated prescribed exs in PT today without adverse effects. Will have pt practice applying k-tape. Pt will continue to benefit from skilled PT to address impairments for improved function.  EVAL: Patient is a 73 y.o. female who was seen today for physical therapy evaluation and treatment for  M17.12  (  ICD-10-CM) - Primary osteoarthritis of left knee  M25.562,G89.29 (ICD-10-CM) - Chronic pain of left knee  Pt presents to PT with chronic L knee pain with decreased AROM and knee/hip strength, and pt walks with a mod antalgic gait pattern. Observation reveals arthritic changes and swelling. Pt was started on a HEP and instructed in self care. Pt may benefit from skilled PT 2w6 to address impairments to optimize L knee function with less pain.   OBJECTIVE IMPAIRMENTS: decreased activity tolerance, decreased balance, difficulty walking, decreased ROM, decreased strength, increased edema, and pain.   ACTIVITY LIMITATIONS: carrying, bending, squatting, stairs, transfers, and locomotion level  PARTICIPATION LIMITATIONS: meal prep, cleaning, laundry, shopping, community activity, and church  PERSONAL FACTORS: Fitness, Past/current experiences, Time since onset of injury/illness/exacerbation, and 1 comorbidity: DM2 are also affecting patient's functional outcome.   REHAB POTENTIAL: Fair chronicity of issue  CLINICAL DECISION MAKING: Evolving/moderate complexity  EVALUATION COMPLEXITY: Moderate   GOALS:  SHORT TERM GOALS = LTGs  LONG TERM GOALS: Target date: 09/14/24  Pt will be Ind in a final HEP to maintain achieved LOF Baseline: started Goal status: ONGOING  2.  Pt will report 50% or greater improvement in her L knee pain for improved function and QOL Baseline:  09/04/24: 90% Goal status: MET  3.  Improve 5xSTS by 10 as indication of improved functional mobility  Baseline: 39 with use of hands 07/30/24: 26.4 sec 09/04/24: 15.7 sec c use of hands Goal status: MET   4.  Pt will demonstrate an improved quality of gait walking with only a minimal to no antalgic gait pattern as indication of improved pain  Baseline: mod antalgic gait pattern Goal status: INITIAL  5.  Pt's LEFS score will improve by the MCID to 35% as indication of improved function  Baseline: 20% Goal status:  INITIAL   PLAN:  PT FREQUENCY: 2x/week  PT DURATION: 6 weeks  PLANNED INTERVENTIONS: 97164- PT Re-evaluation, 97110-Therapeutic exercises, 97530- Therapeutic activity, 97112- Neuromuscular re-education, 97535- Self Care, [id number]- Manual therapy, [id number]- Gait training, [phone]- Aquatic Therapy, [phone]- Electrical stimulation (unattended), [phone]- Ionotophoresis 4mg /ml Dexamethasone, 79439 (1-2 muscles), 20561 (3+ muscles)- Dry Needling, Patient/Family education, Balance training, Stair training, Taping, Joint mobilization, Cryotherapy, and Moist heat  PLAN FOR NEXT SESSION: Assess response to HEP; progress therex as indicated; use of modalities, manual therapy; and TPDN as indicated. KT tape PRN   Dasie Daft MS, PT 09/06/24 8:45 AM

## 2024-09-06 ENCOUNTER — Ambulatory Visit: Payer: Self-pay

## 2024-09-06 DIAGNOSIS — M25562 Pain in left knee: Secondary | ICD-10-CM | POA: Diagnosis not present

## 2024-09-06 DIAGNOSIS — G8929 Other chronic pain: Secondary | ICD-10-CM

## 2024-09-06 DIAGNOSIS — R262 Difficulty in walking, not elsewhere classified: Secondary | ICD-10-CM

## 2024-09-06 DIAGNOSIS — M79662 Pain in left lower leg: Secondary | ICD-10-CM

## 2024-09-06 DIAGNOSIS — M6281 Muscle weakness (generalized): Secondary | ICD-10-CM

## 2024-09-11 ENCOUNTER — Ambulatory Visit: Admitting: Physical Therapy

## 2024-09-11 ENCOUNTER — Encounter: Payer: Self-pay | Admitting: Physical Therapy

## 2024-09-11 DIAGNOSIS — M25562 Pain in left knee: Secondary | ICD-10-CM | POA: Diagnosis not present

## 2024-09-11 DIAGNOSIS — M6281 Muscle weakness (generalized): Secondary | ICD-10-CM

## 2024-09-11 DIAGNOSIS — G8929 Other chronic pain: Secondary | ICD-10-CM

## 2024-09-11 NOTE — Therapy (Signed)
 OUTPATIENT PHYSICAL THERAPY LOWER EXTREMITY TREATMENT   Patient Name: [name] MRN: [id number] DOB:05/12/1951, 73 y.o., female Today's Date: 09/11/2024  END OF SESSION:  PT End of Session - 09/11/24 0804     Visit Number 12    Number of Visits 13    Date for Recertification  09/14/24    Authorization Type [employer]    PT Start Time 0802    PT Stop Time 0845    PT Time Calculation (min) 43 min               Past Medical History:  Diagnosis Date   Anemia    Arthritis    Cardiomyopathy (HCC)    Cholelithiasis    Chronic back pain    Constipation    Esophageal reflux    Fibromyalgia    Hemorrhoids    Hyperlipidemia    Hypertension    Hypothyroid    IBS (irritable bowel syndrome)    Internal hemorrhoids without mention of complication    Left leg pain    Myocardial infarction [facility])    Personal history of colonic polyps 05/06/2010   ADENOMATOUS POLYP   Pneumonia    Tubular adenoma of colon    Past Surgical History:  Procedure Laterality Date   ABDOMINAL HYSTERECTOMY     CERVICAL DISC SURGERY     PLATE IN NECK & BACK   CHOLECYSTECTOMY     COLONOSCOPY  09/27/2011   NORMAL    COLONOSCOPY N/A 07/17/2024   Procedure: COLONOSCOPY;  Surgeon: Albertus Gordy HERO, MD;  Location: [facility] ENDOSCOPY;  Service: Gastroenterology;  Laterality: N/A;   COLONOSCOPY WITH PROPOFOL  N/A 10/06/2020   Procedure: COLONOSCOPY WITH PROPOFOL ;  Surgeon: Mansouraty, Aloha Raddle., MD;  Location: [facility] ENDOSCOPY;  Service: Gastroenterology;  Laterality: N/A;  ultra slim scope    LEFT HEART CATH AND CORONARY ANGIOGRAPHY N/A 05/08/2018   Procedure: LEFT HEART CATH AND CORONARY ANGIOGRAPHY;  Surgeon: Claudene Victory ORN, MD;  Location: [facility] INVASIVE CV LAB;  Service: Cardiovascular;  Laterality: N/A;   LUMBAR DISC SURGERY     POLYPECTOMY  10/06/2020   Procedure: POLYPECTOMY;  Surgeon: Mansouraty, Aloha Raddle., MD;  Location: [facility] ENDOSCOPY;  Service: Gastroenterology;;   TONSILLECTOMY     Patient  Active Problem List   Diagnosis Date Noted   Benign neoplasm of sigmoid colon 07/17/2024   Encounter for immunization 01/08/2024   Eczema of scalp 01/05/2023   Encounter for general adult medical examination with abnormal findings 01/03/2023   Somnolence 09/23/2022   Flu vaccine need 09/14/2022   Sensorineural hearing loss (SNHL) of left ear with unrestricted hearing of right ear 07/21/2022   Primary osteoarthritis of left knee 12/24/2021   Onychomycosis of left great toe 12/14/2021   Type II diabetes mellitus with manifestations (HCC) 12/14/2021   Balding 05/21/2020   Allergic rhinitis 04/12/2020   Acute URI 03/20/2019   Takotsubo cardiomyopathy 02/26/2019   NASH (nonalcoholic steatohepatitis) 01/22/2019   Chronic idiopathic constipation 01/12/2019   Arthritis of carpometacarpal (CMC) joint of left thumb 09/25/2018   Degenerative arthritis of left knee 05/24/2018   CAD (coronary artery disease) 05/16/2018   Vitamin D  deficiency 11/11/2017   Degenerative disc disease, lumbar 08/29/2017   Obesity (BMI 30.0-34.9) 06/14/2016   Osteopenia 07/20/2013   GERD (gastroesophageal reflux disease) 09/16/2011   Generalized anxiety disorder 09/16/2011   History of colonic polyps 08/17/2011   DJD (degenerative joint disease) of knee 06/25/2011   Pure hyperglyceridemia 06/24/2011   Hyperlipidemia LDL goal <70 07/30/2009  Hypothyroidism 04/25/2009   Essential hypertension 04/25/2009    PCP: [name], MD  REFERRING PROVIDER: Leonce Katz, DO   REFERRING DIAG:  F82.87 (ICD-10-CM) - Primary osteoarthritis of left knee  M25.562,G89.29 (ICD-10-CM) - Chronic pain of left knee    THERAPY DIAG:  Chronic pain of left knee  Muscle weakness (generalized)  Rationale for Evaluation and Treatment: Rehabilitation  ONSET DATE: Chronic, over a year  SUBJECTIVE:   SUBJECTIVE STATEMENT:  Pt reports her L knee has continued to feel much better.  EVAL: Pt reports chronic L knee  which signifiicantly impacts her ability to walk and sleep at night. Shots have helped temporarily. Pt notes hse rides an ex bike 15-20 mins everyday.  PERTINENT HISTORY: See above. Hx MI, DM2  PAIN:  Are you having pain? Yes: NPRS scale: 0/10 Pain location: ant/medial L knee Pain description: throbbing, sharp Aggravating factors: standing, walking, sleeping Relieving factors: Rest sometimes helps  PRECAUTIONS: None  RED FLAGS: None   WEIGHT BEARING RESTRICTIONS: No  FALLS:  Has patient fallen in last 6 months? No  LIVING ENVIRONMENT: Lives with: lives alone Lives in: House/apartment Stairs: Yes: External: 3 steps; none Has following equipment at home: Single point cane  OCCUPATION: Retired  PLOF: Independent  PATIENT GOALS: Less pain, to get around better, go to church  NEXT MD VISIT: 08/21/24, Dr. Leonce  OBJECTIVE:  Note: Objective measures were completed at Evaluation unless otherwise noted.  DIAGNOSTIC FINDINGS:  L knee 12/23/23 IMPRESSION: Tricompartmental osteoarthritis, moderate in the medial compartment. Mild progression from prior exam.  PATIENT SURVEYS:  LEFS:16/80=20% LEFS 90% 80/90 09/11/24  COGNITION: Overall cognitive status: Within functional limits for tasks assessed     SENSATION: WFL  EDEMA:  Swelling L knee   MUSCLE LENGTH: Hamstrings: Right NT deg; Left NT deg Thomas test: Right NT deg; Left NT deg  POSTURE: No Significant postural limitations  PALPATION: TTP ant/medial L knee joint space   LOWER EXTREMITY ROM:  Active ROM Right eval Left eval Left  08/06/24 Left 08/14/24 Left  08/16/24 Left 08/23/24  Hip flexion        Hip extension        Hip abduction        Hip adduction        Hip internal rotation        Hip external rotation        Knee flexion 135 90 112 117 120 125  Knee extension 0 15 lacking Lacks less than 5     Ankle dorsiflexion        Ankle plantarflexion        Ankle inversion        Ankle eversion          (Blank rows = not tested)  LOWER EXTREMITY MMT:  MMT Right eval Left eval  Hip flexion 4 3  Hip extension    Hip abduction 4 3  Hip adduction    Hip internal rotation    Hip external rotation 4 3  Knee flexion 4+ 4  Knee extension 4+ 4  Ankle dorsiflexion    Ankle plantarflexion    Ankle inversion    Ankle eversion     (Blank rows = not tested)  FUNCTIONAL TESTS:  5 times sit to stand: 39 c use of hands 07/30/24: 26.4 without UE   GAIT: Distance walked: 200' Assistive device utilized: None Level of assistance: Complete Independence Comments: Mod antalgic gait pattern over L LE at a decreased pace  and flexed L knee                                                                                                                  TREATMENT DATE:  [facility] Adult PT Treatment:                                                DATE: 09/11/24 Therapeutic Exercise/Activity: Rec Bike L2 x 5 minutes  SLR c QS 2x10 LAQ 5# 2x10 STS from elevated mat table on airex 2x10 Bilat heel raise x 15 Lateral step ups 2x10 from airex Hip abd on airex 2x 10  Manual Therapy: [email or web address] Above KT tape applied for anterior knee pain Self Care: Pt Ed for for the application of K-tape to the L knee was continued with pt assisted with applying the tape properly   [facility] Adult PT Treatment:                                                DATE: 09/06/24 Therapeutic Exercise/Activity: Rec Bike L2 x 5 minutes  SLR c QS 2x10 LAQ 5# 2x10 STS from elevated mat table on airex 2x10 Standing hip flexor stretch Standing quad stretch Bilat heel raise x 15 Lateral step ups 2x10 5 on airex Hip abd on airex 2x 10 5# bilat Manual Therapy: [email or web address] Above KT tape applied for anterior knee pain Self Care: Pt Ed for for the application of K-tape to the L knee was continued with pt assisted with applying the tape properly  [facility] Adult PT  Treatment:                                                DATE: 09/04/24 Therapeutic Exercise/Activity: Rec Bike L2 x 5 minutes  SLR c QS x 15 LAQ 5# x15 STS from elevated mat table on airex 2x10 Standing hip flexor stretch Standing quad stretch Bilat heel raise x 15 Tandem and SL stance on airex Hip abd on airex 2x 10 5# bilat Manual Therapy: [email or web address] Above KT tape applied for anterior knee pain Self Care: Pt Ed for for the application of K-tape to the L knee initiated   [facility] Adult PT Treatment:                                                DATE: 08/30/24 Therapeutic Exercise/Activity: Rec Bike L2 x 5 minutes  QS x 10 into towel  SLR c QS x 15 SAQ 5#  x15 Bridge with ball squeeze x 15 Hip flexor stretch left EOM with knee flex 15 sec x 3  Supine hamstring stretch 15 sec x 3 STS x 10 Bilat heel raise x 15 Tandem and SL stance on airex 4 inch lateral step up x 15  [facility] Adult PT Treatment:                                                DATE: 08/28/24 Therapeutic Exercise: Rec Bike L3 x 5 minutes  4 inch lateral step up x 10  Bilat heel raise x 15 Tandem stance 25 sec  each  Gastroc stretch  STS x 10 LAQ 5# 2 x 10 Seated H/S curl GTB band x 20 Bridge with ball squeeze x 15  Hip flexor stretch left EOM with knee flex 10 sec x 3  SLR c QS 2 x 10 Therapeutic Activity: Dynamic Gait Index  Mark the lowest level that applies.   Date Performed 08/28/24  Gait level surface (3) Normal: walks 20', no AD, good speed, no evidence for imbalance, normal gait pattern  2. Change in gait speed (2) Mild Impairment: Is able to change speed but demonstrates mild gait deviations, or not gait deviations but unable to achieve a significant change in velocity, or uses an assistive device  3. Gait with horizontal head turns (3) Normal: Performs head turns smoothly with no change in gait  4. Gait with vertical head turns (2) Mild Impairment: Performs head turns  smoothly with slight change in gait velocity, i.e., minor disruption to smooth gait path or uses walking aid  5. Gait and pivot turn (3) Normal: Is able to step over the box without changing gait speed, no evidence of imbalance  6. Step over obstacle (3) Normal: Is able to step over the box without changing gait speed, no evidence of imbalance  7. Step around obstacle (3) Normal: Is able to walk around cones safely without changing gait speed; no evidence of imbalance  8. Steps (3) Normal: Alternating feet, no rail  Total score 22/24   Score Interpretation: Score of <19 indicates high risk of falls. Manual Therapy: [email or web address] Above KT tape applied for anterior knee   PATIENT EDUCATION:  Education details: Eval findings, POC, HEP, self care  Person educated: Patient Education method: Explanation, Demonstration, Tactile cues, Verbal cues, and Handouts Education comprehension: verbalized understanding, returned demonstration, verbal cues required, and tactile cues required  HOME EXERCISE PROGRAM: Access Code: [email or web address] URL: [email or web address] Date: 08/09/2024 Prepared by: Dasie Daft  Exercises - Supine Quad Set  - 3 x daily - 7 x weekly - 2 sets - 10 reps - 5 hold - Active Straight Leg Raise with Quad Set  - 3 x daily - 7 x weekly - 2 sets - 10 reps - 3 hold - Seated Long Arc Quad  - 1 x daily - 7 x weekly - 2 sets - 10 reps - 5 hold - Seated Table Hamstring Stretch  - 1 x daily - 7 x weekly - 1 sets - 3 reps - 30 hold - Supine Bridge  - 1 x daily - 7 x weekly - 2 sets - 10 reps - 3 hold - Standing Tandem Balance with Counter Support  - 1 x daily - 7 x weekly - 3 sets - 20-30 hold - Sit to stand with control  -  1 x daily - 7 x weekly - 2 sets - 5-10 reps  ASSESSMENT:  CLINICAL IMPRESSION: Continued instruction in the application of K-tape with pt. Today, pt was was able to apply the tape with assistance from this PT. Pt was given  video on You Tube to access the taping instructions. Exs were then completed for strengthening and balance. LEFS significantly improved. Pt tolerated prescribed exs in PT today without adverse effects.  Pt will continue to benefit from skilled PT to address impairments for improved function.Will be ready for DC next visit.   EVAL: Patient is a 73 y.o. female who was seen today for physical therapy evaluation and treatment for  M17.12 (ICD-10-CM) - Primary osteoarthritis of left knee  M25.562,G89.29 (ICD-10-CM) - Chronic pain of left knee  Pt presents to PT with chronic L knee pain with decreased AROM and knee/hip strength, and pt walks with a mod antalgic gait pattern. Observation reveals arthritic changes and swelling. Pt was started on a HEP and instructed in self care. Pt may benefit from skilled PT 2w6 to address impairments to optimize L knee function with less pain.   OBJECTIVE IMPAIRMENTS: decreased activity tolerance, decreased balance, difficulty walking, decreased ROM, decreased strength, increased edema, and pain.   ACTIVITY LIMITATIONS: carrying, bending, squatting, stairs, transfers, and locomotion level  PARTICIPATION LIMITATIONS: meal prep, cleaning, laundry, shopping, community activity, and church  PERSONAL FACTORS: Fitness, Past/current experiences, Time since onset of injury/illness/exacerbation, and 1 comorbidity: DM2 are also affecting patient's functional outcome.   REHAB POTENTIAL: Fair chronicity of issue  CLINICAL DECISION MAKING: Evolving/moderate complexity  EVALUATION COMPLEXITY: Moderate   GOALS:  SHORT TERM GOALS = LTGs  LONG TERM GOALS: Target date: 09/14/24  Pt will be Ind in a final HEP to maintain achieved LOF Baseline: started Goal status: ONGOING  2.  Pt will report 50% or greater improvement in her L knee pain for improved function and QOL Baseline:  09/04/24: 90% Goal status: MET  3.  Improve 5xSTS by 10 as indication of improved functional  mobility  Baseline: 39 with use of hands 07/30/24: 26.4 sec 09/04/24: 15.7 sec c use of hands Goal status: MET   4.  Pt will demonstrate an improved quality of gait walking with only a minimal to no antalgic gait pattern as indication of improved pain  Baseline: mod antalgic gait pattern 09/11/24: no antalgia Goal status: MET  5.  Pt's LEFS score will improve by the MCID to 35% as indication of improved function  Baseline: 20% 09/11/24: 88%  Goal status: MET   PLAN:  PT FREQUENCY: 2x/week  PT DURATION: 6 weeks  PLANNED INTERVENTIONS: 97164- PT Re-evaluation, 97110-Therapeutic exercises, 97530- Therapeutic activity, 97112- Neuromuscular re-education, 97535- Self Care, 02859- Manual therapy, [id number]- Gait training, [phone]- Aquatic Therapy, [phone]- Electrical stimulation (unattended), [phone]- Ionotophoresis 4mg /ml Dexamethasone, 20560 (1-2 muscles), 20561 (3+ muscles)- Dry Needling, Patient/Family education, Balance training, Stair training, Taping, Joint mobilization, Cryotherapy, and Moist heat  PLAN FOR NEXT SESSION: D/C to HEP   Harlene Persons, PTA 09/11/24 9:20 AM Phone: [phone] Fax: [phone]

## 2024-09-13 ENCOUNTER — Ambulatory Visit: Payer: Self-pay | Admitting: Physical Therapy

## 2024-09-13 ENCOUNTER — Encounter: Payer: Self-pay | Admitting: Physical Therapy

## 2024-09-13 DIAGNOSIS — M6281 Muscle weakness (generalized): Secondary | ICD-10-CM

## 2024-09-13 DIAGNOSIS — G8929 Other chronic pain: Secondary | ICD-10-CM

## 2024-09-13 DIAGNOSIS — M25562 Pain in left knee: Secondary | ICD-10-CM | POA: Diagnosis not present

## 2024-09-13 NOTE — Therapy (Addendum)
 OUTPATIENT PHYSICAL THERAPY LOWER EXTREMITY TREATMENT/DC   Patient Name: [name] MRN: [id number] DOB:1951/05/03, 73 y.o., female Today's Date: 09/13/2024  END OF SESSION:  PT End of Session - 09/13/24 0808     Visit Number 13    Number of Visits 13    Date for Recertification  09/14/24    Authorization Type [employer]    PT Start Time 0802    PT Stop Time 0840    PT Time Calculation (min) 38 min               Past Medical History:  Diagnosis Date   Anemia    Arthritis    Cardiomyopathy (HCC)    Cholelithiasis    Chronic back pain    Constipation    Esophageal reflux    Fibromyalgia    Hemorrhoids    Hyperlipidemia    Hypertension    Hypothyroid    IBS (irritable bowel syndrome)    Internal hemorrhoids without mention of complication    Left leg pain    Myocardial infarction [facility])    Personal history of colonic polyps 05/06/2010   ADENOMATOUS POLYP   Pneumonia    Tubular adenoma of colon    Past Surgical History:  Procedure Laterality Date   ABDOMINAL HYSTERECTOMY     CERVICAL DISC SURGERY     PLATE IN NECK & BACK   CHOLECYSTECTOMY     COLONOSCOPY  09/27/2011   NORMAL    COLONOSCOPY N/A 07/17/2024   Procedure: COLONOSCOPY;  Surgeon: Albertus Gordy HERO, MD;  Location: [facility] ENDOSCOPY;  Service: Gastroenterology;  Laterality: N/A;   COLONOSCOPY WITH PROPOFOL  N/A 10/06/2020   Procedure: COLONOSCOPY WITH PROPOFOL ;  Surgeon: Mansouraty, Aloha Raddle., MD;  Location: [facility] ENDOSCOPY;  Service: Gastroenterology;  Laterality: N/A;  ultra slim scope    LEFT HEART CATH AND CORONARY ANGIOGRAPHY N/A 05/08/2018   Procedure: LEFT HEART CATH AND CORONARY ANGIOGRAPHY;  Surgeon: Claudene Victory ORN, MD;  Location: [facility] INVASIVE CV LAB;  Service: Cardiovascular;  Laterality: N/A;   LUMBAR DISC SURGERY     POLYPECTOMY  10/06/2020   Procedure: POLYPECTOMY;  Surgeon: Mansouraty, Aloha Raddle., MD;  Location: [facility] ENDOSCOPY;  Service: Gastroenterology;;   TONSILLECTOMY      Patient Active Problem List   Diagnosis Date Noted   Benign neoplasm of sigmoid colon 07/17/2024   Encounter for immunization 01/08/2024   Eczema of scalp 01/05/2023   Encounter for general adult medical examination with abnormal findings 01/03/2023   Somnolence 09/23/2022   Flu vaccine need 09/14/2022   Sensorineural hearing loss (SNHL) of left ear with unrestricted hearing of right ear 07/21/2022   Primary osteoarthritis of left knee 12/24/2021   Onychomycosis of left great toe 12/14/2021   Type II diabetes mellitus with manifestations (HCC) 12/14/2021   Balding 05/21/2020   Allergic rhinitis 04/12/2020   Acute URI 03/20/2019   Takotsubo cardiomyopathy 02/26/2019   NASH (nonalcoholic steatohepatitis) 01/22/2019   Chronic idiopathic constipation 01/12/2019   Arthritis of carpometacarpal (CMC) joint of left thumb 09/25/2018   Degenerative arthritis of left knee 05/24/2018   CAD (coronary artery disease) 05/16/2018   Vitamin D  deficiency 11/11/2017   Degenerative disc disease, lumbar 08/29/2017   Obesity (BMI 30.0-34.9) 06/14/2016   Osteopenia 07/20/2013   GERD (gastroesophageal reflux disease) 09/16/2011   Generalized anxiety disorder 09/16/2011   History of colonic polyps 08/17/2011   DJD (degenerative joint disease) of knee 06/25/2011   Pure hyperglyceridemia 06/24/2011   Hyperlipidemia LDL goal <70 07/30/2009  Hypothyroidism 04/25/2009   Essential hypertension 04/25/2009    PCP: [name], MD  REFERRING PROVIDER: Leonce Katz, DO   REFERRING DIAG:  F82.87 (ICD-10-CM) - Primary osteoarthritis of left knee  M25.562,G89.29 (ICD-10-CM) - Chronic pain of left knee    THERAPY DIAG:  Chronic pain of left knee  Muscle weakness (generalized)  Rationale for Evaluation and Treatment: Rehabilitation  ONSET DATE: Chronic, over a year  SUBJECTIVE:   SUBJECTIVE STATEMENT:  Pt reports her L knee has continued to feel much better.  EVAL: Pt reports chronic  L knee which signifiicantly impacts her ability to walk and sleep at night. Shots have helped temporarily. Pt notes hse rides an ex bike 15-20 mins everyday.  PERTINENT HISTORY: See above. Hx MI, DM2  PAIN:  Are you having pain? Yes: NPRS scale: 0/10 Pain location: ant/medial L knee Pain description: throbbing, sharp Aggravating factors: standing, walking, sleeping Relieving factors: Rest sometimes helps  PRECAUTIONS: None  RED FLAGS: None   WEIGHT BEARING RESTRICTIONS: No  FALLS:  Has patient fallen in last 6 months? No  LIVING ENVIRONMENT: Lives with: lives alone Lives in: House/apartment Stairs: Yes: External: 3 steps; none Has following equipment at home: Single point cane  OCCUPATION: Retired  PLOF: Independent  PATIENT GOALS: Less pain, to get around better, go to church  NEXT MD VISIT: 08/21/24, Dr. Leonce  OBJECTIVE:  Note: Objective measures were completed at Evaluation unless otherwise noted.  DIAGNOSTIC FINDINGS:  L knee 12/23/23 IMPRESSION: Tricompartmental osteoarthritis, moderate in the medial compartment. Mild progression from prior exam.  PATIENT SURVEYS:  LEFS:16/80=20% LEFS 90% 80/90 09/11/24  COGNITION: Overall cognitive status: Within functional limits for tasks assessed     SENSATION: WFL  EDEMA:  Swelling L knee   MUSCLE LENGTH: Hamstrings: Right NT deg; Left NT deg Thomas test: Right NT deg; Left NT deg  POSTURE: No Significant postural limitations  PALPATION: TTP ant/medial L knee joint space   LOWER EXTREMITY ROM:  Active ROM Right eval Left eval Left  08/06/24 Left 08/14/24 Left  08/16/24 Left 08/23/24  Hip flexion        Hip extension        Hip abduction        Hip adduction        Hip internal rotation        Hip external rotation        Knee flexion 135 90 112 117 120 125  Knee extension 0 15 lacking Lacks less than 5     Ankle dorsiflexion        Ankle plantarflexion        Ankle inversion        Ankle  eversion         (Blank rows = not tested)  LOWER EXTREMITY MMT:  MMT Right eval Left eval  Hip flexion 4 3  Hip extension    Hip abduction 4 3  Hip adduction    Hip internal rotation    Hip external rotation 4 3  Knee flexion 4+ 4  Knee extension 4+ 4  Ankle dorsiflexion    Ankle plantarflexion    Ankle inversion    Ankle eversion     (Blank rows = not tested)  FUNCTIONAL TESTS:  5 times sit to stand: 39 c use of hands 07/30/24: 26.4 without UE   GAIT: Distance walked: 200' Assistive device utilized: None Level of assistance: Complete Independence Comments: Mod antalgic gait pattern over L LE at a decreased pace  and flexed L knee                                                                                                                  TREATMENT DATE:  [facility] Adult PT Treatment:                                                DATE: 09/13/24 Therapeutic Exercise/Activity: Rec Bike L2 x 5 minutes  SLR c QS 2x10 LAQ with ball squeeze 2x10 STS from elevated mat table on airex 2x10 Bilat heel raise x 15 Lateral step ups 2x10 4 inch  Hip abd on airex 2x 10    [facility] Adult PT Treatment:                                                DATE: 09/11/24 Therapeutic Exercise/Activity: Rec Bike L2 x 5 minutes  SLR c QS 2x10 LAQ 5# 2x10 STS from elevated mat table on airex 2x10 Bilat heel raise x 15 Lateral step ups 2x10 from airex Hip abd on airex 2x 10  Manual Therapy: [email or web address] Above KT tape applied for anterior knee pain Self Care: Pt Ed for for the application of K-tape to the L knee was continued with pt assisted with applying the tape properly   [facility] Adult PT Treatment:                                                DATE: 09/06/24 Therapeutic Exercise/Activity: Rec Bike L2 x 5 minutes  SLR c QS 2x10 LAQ 5# 2x10 STS from elevated mat table on airex 2x10 Standing hip flexor stretch Standing quad stretch Bilat heel raise x  15 Lateral step ups 2x10 5 on airex Hip abd on airex 2x 10 5# bilat Manual Therapy: [email or web address] Above KT tape applied for anterior knee pain Self Care: Pt Ed for for the application of K-tape to the L knee was continued with pt assisted with applying the tape properly  [facility] Adult PT Treatment:                                                DATE: 09/04/24 Therapeutic Exercise/Activity: Rec Bike L2 x 5 minutes  SLR c QS x 15 LAQ 5# x15 STS from elevated mat table on airex 2x10 Standing hip flexor stretch Standing quad stretch Bilat heel raise x 15 Tandem and SL stance on airex Hip abd on airex 2x 10 5# bilat Manual Therapy: [email or web address]  Above KT tape applied for anterior knee pain Self Care: Pt Ed for for the application of K-tape to the L knee initiated   [facility] Adult PT Treatment:                                                DATE: 08/30/24 Therapeutic Exercise/Activity: Rec Bike L2 x 5 minutes  QS x 10 into towel  SLR c QS x 15 SAQ 5# x15 Bridge with ball squeeze x 15 Hip flexor stretch left EOM with knee flex 15 sec x 3  Supine hamstring stretch 15 sec x 3 STS x 10 Bilat heel raise x 15 Tandem and SL stance on airex 4 inch lateral step up x 15  [facility] Adult PT Treatment:                                                DATE: 08/28/24 Therapeutic Exercise: Rec Bike L3 x 5 minutes  4 inch lateral step up x 10  Bilat heel raise x 15 Tandem stance 25 sec  each  Gastroc stretch  STS x 10 LAQ 5# 2 x 10 Seated H/S curl GTB band x 20 Bridge with ball squeeze x 15  Hip flexor stretch left EOM with knee flex 10 sec x 3  SLR c QS 2 x 10 Therapeutic Activity: Dynamic Gait Index  Mark the lowest level that applies.   Date Performed 08/28/24  Gait level surface (3) Normal: walks 20', no AD, good speed, no evidence for imbalance, normal gait pattern  2. Change in gait speed (2) Mild Impairment: Is able to change  speed but demonstrates mild gait deviations, or not gait deviations but unable to achieve a significant change in velocity, or uses an assistive device  3. Gait with horizontal head turns (3) Normal: Performs head turns smoothly with no change in gait  4. Gait with vertical head turns (2) Mild Impairment: Performs head turns smoothly with slight change in gait velocity, i.e., minor disruption to smooth gait path or uses walking aid  5. Gait and pivot turn (3) Normal: Is able to step over the box without changing gait speed, no evidence of imbalance  6. Step over obstacle (3) Normal: Is able to step over the box without changing gait speed, no evidence of imbalance  7. Step around obstacle (3) Normal: Is able to walk around cones safely without changing gait speed; no evidence of imbalance  8. Steps (3) Normal: Alternating feet, no rail  Total score 22/24   Score Interpretation: Score of <19 indicates high risk of falls. Manual Therapy: [email or web address] Above KT tape applied for anterior knee   PATIENT EDUCATION:  Education details: Eval findings, POC, HEP, self care  Person educated: Patient Education method: Explanation, Demonstration, Tactile cues, Verbal cues, and Handouts Education comprehension: verbalized understanding, returned demonstration, verbal cues required, and tactile cues required  HOME EXERCISE PROGRAM: Access Code: [email or web address] URL: [email or web address] Date: 08/09/2024 Prepared by: Dasie Daft  Exercises - Supine Quad Set  - 3 x daily - 7 x weekly - 2 sets - 10 reps - 5 hold - Active Straight Leg Raise with Quad Set  - 3 x daily - 7 x weekly - 2 sets -  10 reps - 3 hold - Seated Long Arc Quad  - 1 x daily - 7 x weekly - 2 sets - 10 reps - 5 hold - Seated Table Hamstring Stretch  - 1 x daily - 7 x weekly - 1 sets - 3 reps - 30 hold - Supine Bridge  - 1 x daily - 7 x weekly - 2 sets - 10 reps - 3 hold - Standing Tandem Balance  with Counter Support  - 1 x daily - 7 x weekly - 3 sets - 20-30 hold - Sit to stand with control  - 1 x daily - 7 x weekly - 2 sets - 5-10 reps  ASSESSMENT:  CLINICAL IMPRESSION: Pt has completed 6 weeks of PT for her Left knee and reports 100% improvement in her knee pain and function. All LTGs met. She is independent with HEP and has instructions on applying KT tape as needed. She is appropriate and agreeable to DC to HEP today.   EVAL: Patient is a 73 y.o. female who was seen today for physical therapy evaluation and treatment for  M17.12 (ICD-10-CM) - Primary osteoarthritis of left knee  M25.562,G89.29 (ICD-10-CM) - Chronic pain of left knee  Pt presents to PT with chronic L knee pain with decreased AROM and knee/hip strength, and pt walks with a mod antalgic gait pattern. Observation reveals arthritic changes and swelling. Pt was started on a HEP and instructed in self care. Pt may benefit from skilled PT 2w6 to address impairments to optimize L knee function with less pain.   OBJECTIVE IMPAIRMENTS: decreased activity tolerance, decreased balance, difficulty walking, decreased ROM, decreased strength, increased edema, and pain.   ACTIVITY LIMITATIONS: carrying, bending, squatting, stairs, transfers, and locomotion level  PARTICIPATION LIMITATIONS: meal prep, cleaning, laundry, shopping, community activity, and church  PERSONAL FACTORS: Fitness, Past/current experiences, Time since onset of injury/illness/exacerbation, and 1 comorbidity: DM2 are also affecting patient's functional outcome.   REHAB POTENTIAL: Fair chronicity of issue  CLINICAL DECISION MAKING: Evolving/moderate complexity  EVALUATION COMPLEXITY: Moderate   GOALS:  SHORT TERM GOALS = LTGs  LONG TERM GOALS: Target date: 09/14/24  Pt will be Ind in a final HEP to maintain achieved LOF Baseline: started Goal status: MET  2.  Pt will report 50% or greater improvement in her L knee pain for improved function and  QOL Baseline:  09/04/24: 90% 09/13/24: 100%  Goal status: MET  3.  Improve 5xSTS by 10 as indication of improved functional mobility  Baseline: 39 with use of hands 07/30/24: 26.4 sec 09/04/24: 15.7 sec c use of hands Goal status: MET   4.  Pt will demonstrate an improved quality of gait walking with only a minimal to no antalgic gait pattern as indication of improved pain  Baseline: mod antalgic gait pattern 09/11/24: no antalgia Goal status: MET  5.  Pt's LEFS score will improve by the MCID to 35% as indication of improved function  Baseline: 20% 09/11/24: 88%  Goal status: MET   PLAN:  PT FREQUENCY: 2x/week  PT DURATION: 6 weeks  PLANNED INTERVENTIONS: 97164- PT Re-evaluation, 97110-Therapeutic exercises, 97530- Therapeutic activity, 97112- Neuromuscular re-education, 97535- Self Care, 02859- Manual therapy, [id number]- Gait training, [phone]- Aquatic Therapy, [phone]- Electrical stimulation (unattended), [phone]- Ionotophoresis 4mg /ml Dexamethasone, 20560 (1-2 muscles), 20561 (3+ muscles)- Dry Needling, Patient/Family education, Balance training, Stair training, Taping, Joint mobilization, Cryotherapy, and Moist heat     Harlene Persons, PTA 09/13/24 8:33 AM Phone: [phone] Fax: [phone]   PHYSICAL THERAPY DISCHARGE SUMMARY  Visits from Start of Care: 13  Current functional level related to goals / functional outcomes: See clinical impression and PT goals    Remaining deficits: See clinical impression and PT goals    Education / Equipment: HEP/Pt ED   Patient agrees to discharge. Patient goals were met. Patient is being discharged due to meeting the stated rehab goals.  Allen Ralls MS, PT 10/04/24 4:36 PM

## 2024-09-14 ENCOUNTER — Other Ambulatory Visit: Payer: Self-pay | Admitting: Internal Medicine

## 2024-09-14 ENCOUNTER — Other Ambulatory Visit: Payer: Self-pay | Admitting: Cardiovascular Disease

## 2024-09-14 DIAGNOSIS — E559 Vitamin D deficiency, unspecified: Secondary | ICD-10-CM

## 2024-09-18 DIAGNOSIS — H00025 Hordeolum internum left lower eyelid: Secondary | ICD-10-CM | POA: Diagnosis not present

## 2024-09-18 DIAGNOSIS — H40003 Preglaucoma, unspecified, bilateral: Secondary | ICD-10-CM | POA: Diagnosis not present

## 2024-09-20 ENCOUNTER — Other Ambulatory Visit: Payer: Self-pay | Admitting: Cardiovascular Disease

## 2024-09-20 DIAGNOSIS — I10 Essential (primary) hypertension: Secondary | ICD-10-CM

## 2024-09-20 DIAGNOSIS — I2510 Atherosclerotic heart disease of native coronary artery without angina pectoris: Secondary | ICD-10-CM

## 2024-09-21 ENCOUNTER — Telehealth: Payer: Self-pay | Admitting: Pharmacist

## 2024-09-21 DIAGNOSIS — E785 Hyperlipidemia, unspecified: Secondary | ICD-10-CM

## 2024-09-21 MED ORDER — VITAMIN D (ERGOCALCIFEROL) 1.25 MG (50000 UNIT) PO CAPS: 50000.0000 [IU] | ORAL_CAPSULE | ORAL | 1 refills | Status: AC

## 2024-09-21 MED ORDER — ROSUVASTATIN CALCIUM 10 MG PO TABS: 10.0000 mg | ORAL_TABLET | Freq: Every day | ORAL | 1 refills | Status: AC

## 2024-09-21 NOTE — Progress Notes (Signed)
 Pharmacy Quality Measure Review  This patient is appearing on a report for being at risk of failing the adherence measure for cholesterol (statin) medications this calendar year.   Medication: rosuvastatin  Last fill date: 04/24/24 for 90 day supply  Spoke to patient regarding rosuvastatin  adherence. She reports she is still taking rosuvastatin  daily but does request a refill. No refills are remaining on the prescription. Will send a refill for her.  Pt also requests refill on once weekly vitamin D  as she prefers the once weekly instead of daily. Of note, she has not had a vitamin D  level since 2023. Will send refill and recommend updating vitamin D  level.  Lastly, pt requests appt for flu shot. Scheduled her on nurse schedule for flu shot on 10/28 at 9 AM.  Darrelyn Drum, PharmD, BCPS, CPP Clinical Pharmacist Practitioner [facility] at [facility] [phone]

## 2024-09-25 ENCOUNTER — Ambulatory Visit (INDEPENDENT_AMBULATORY_CARE_PROVIDER_SITE_OTHER)

## 2024-09-25 DIAGNOSIS — Z23 Encounter for immunization: Secondary | ICD-10-CM

## 2024-09-25 NOTE — Progress Notes (Signed)
 Patient visits today to receive her HD Flu injection/vaccine. Patient was informed and tolerated well. Patient was notified to reach out to us  if needed.
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
## 2024-10-08 ENCOUNTER — Ambulatory Visit (INDEPENDENT_AMBULATORY_CARE_PROVIDER_SITE_OTHER): Admitting: Sports Medicine

## 2024-10-08 VITALS — BP 118/76 | HR 66 | Ht 66.0 in | Wt 178.0 lb

## 2024-10-08 DIAGNOSIS — M1712 Unilateral primary osteoarthritis, left knee: Secondary | ICD-10-CM

## 2024-10-08 DIAGNOSIS — M25562 Pain in left knee: Secondary | ICD-10-CM | POA: Diagnosis not present

## 2024-10-08 DIAGNOSIS — G8929 Other chronic pain: Secondary | ICD-10-CM | POA: Diagnosis not present

## 2024-10-08 MED ORDER — METHYLPREDNISOLONE 4 MG PO TBPK: ORAL_TABLET | ORAL | 0 refills | Status: AC

## 2024-10-08 NOTE — Patient Instructions (Signed)
 Tylenol  [phone] mg 2-3 times a day for pain relief   Prednisone  dose pak  Recommend continue HEP   And get a TENS unit from [facility]   Can return for repeat injection after Dec 23rd 2025
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
## 2024-12-03 ENCOUNTER — Telehealth: Payer: Self-pay | Admitting: Sports Medicine

## 2024-12-03 ENCOUNTER — Ambulatory Visit (INDEPENDENT_AMBULATORY_CARE_PROVIDER_SITE_OTHER): Admitting: Sports Medicine

## 2024-12-03 ENCOUNTER — Ambulatory Visit

## 2024-12-03 VITALS — BP 132/76 | HR 78 | Ht 66.0 in | Wt 174.0 lb

## 2024-12-03 DIAGNOSIS — W19XXXA Unspecified fall, initial encounter: Secondary | ICD-10-CM | POA: Diagnosis not present

## 2024-12-03 DIAGNOSIS — M1712 Unilateral primary osteoarthritis, left knee: Secondary | ICD-10-CM

## 2024-12-03 DIAGNOSIS — M25562 Pain in left knee: Secondary | ICD-10-CM

## 2024-12-03 DIAGNOSIS — G8929 Other chronic pain: Secondary | ICD-10-CM

## 2024-12-03 MED ORDER — METHYLPREDNISOLONE 4 MG PO TBPK: ORAL_TABLET | ORAL | 0 refills | Status: AC

## 2024-12-03 NOTE — Telephone Encounter (Signed)
 L knee zilretta 

## 2024-12-03 NOTE — Patient Instructions (Addendum)
 Prior auth sent in L knee   Prednisone  dos pak   Tylenol  [phone] mg 2-3 times a day for pain relief   Xrays on the way out

## 2024-12-03 NOTE — Telephone Encounter (Signed)
 Zilretta  benefits for left knee case ID [id number]

## 2024-12-04 NOTE — Telephone Encounter (Signed)
 Had to do a prior Auth and upload clinicals through [employer] pre cert number [id number]

## 2024-12-07 NOTE — Telephone Encounter (Signed)
 Please schedule patient when medication is stocked  ZILRETTA  authorized for left knee PRE CERT REQUIRED Copay $10 Deductible does not apply OOP MAX $3950 has met $0 Once OOP has been met copay will no longer apply AUTHORIZATION # [id number] 12/04/24-11/28/25

## 2024-12-11 ENCOUNTER — Ambulatory Visit: Payer: Self-pay | Admitting: Sports Medicine

## 2024-12-11 ENCOUNTER — Ambulatory Visit: Admitting: Sports Medicine

## 2024-12-11 VITALS — HR 86 | Ht 66.0 in | Wt 180.0 lb

## 2024-12-11 DIAGNOSIS — M1712 Unilateral primary osteoarthritis, left knee: Secondary | ICD-10-CM | POA: Diagnosis not present

## 2024-12-11 DIAGNOSIS — M25562 Pain in left knee: Secondary | ICD-10-CM | POA: Diagnosis not present

## 2024-12-11 DIAGNOSIS — G8929 Other chronic pain: Secondary | ICD-10-CM

## 2024-12-11 MED ORDER — TRIAMCINOLONE ACETONIDE 32 MG IX SRER
32.0000 mg | Freq: Once | INTRA_ARTICULAR | Status: AC
  Administered 2024-12-11: 32 mg via INTRA_ARTICULAR

## 2024-12-11 NOTE — Telephone Encounter (Signed)
 Scheduled 1/13
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
## 2024-12-11 NOTE — Patient Instructions (Signed)
 4-6 week follow up   Tylenol  [phone] mg 2-3 times a day for pain relief

## 2024-12-16 ENCOUNTER — Other Ambulatory Visit: Payer: Self-pay | Admitting: Internal Medicine

## 2024-12-16 DIAGNOSIS — J302 Other seasonal allergic rhinitis: Secondary | ICD-10-CM

## 2025-01-04 ENCOUNTER — Ambulatory Visit

## 2025-01-07 ENCOUNTER — Ambulatory Visit: Admitting: Internal Medicine

## 2025-01-15 ENCOUNTER — Ambulatory Visit: Admitting: Sports Medicine

## 2025-01-15 ENCOUNTER — Ambulatory Visit: Payer: HMO

## 2025-01-16 ENCOUNTER — Ambulatory Visit

## 2025-01-18 ENCOUNTER — Ambulatory Visit
